# Patient Record
Sex: Male | Born: 1955
Health system: Southern US, Community
[De-identification: ages and names within clinical notes are randomized; demographics above are authoritative.]

## PROBLEM LIST (undated history)

## (undated) DIAGNOSIS — E669 Obesity, unspecified: Secondary | ICD-10-CM

## (undated) DIAGNOSIS — F411 Generalized anxiety disorder: Secondary | ICD-10-CM

## (undated) DIAGNOSIS — Z9889 Other specified postprocedural states: Secondary | ICD-10-CM

## (undated) DIAGNOSIS — I509 Heart failure, unspecified: Secondary | ICD-10-CM

## (undated) DIAGNOSIS — I2510 Atherosclerotic heart disease of native coronary artery without angina pectoris: Secondary | ICD-10-CM

## (undated) DIAGNOSIS — G4733 Obstructive sleep apnea (adult) (pediatric): Secondary | ICD-10-CM

## (undated) DIAGNOSIS — Z951 Presence of aortocoronary bypass graft: Secondary | ICD-10-CM

## (undated) DIAGNOSIS — N183 Chronic kidney disease, stage 3 unspecified: Secondary | ICD-10-CM

## (undated) DIAGNOSIS — I5189 Other ill-defined heart diseases: Secondary | ICD-10-CM

## (undated) DIAGNOSIS — I499 Cardiac arrhythmia, unspecified: Secondary | ICD-10-CM

## (undated) DIAGNOSIS — Z77090 Contact with and (suspected) exposure to asbestos: Secondary | ICD-10-CM

## (undated) DIAGNOSIS — Z8679 Personal history of other diseases of the circulatory system: Secondary | ICD-10-CM

## (undated) DIAGNOSIS — E038 Other specified hypothyroidism: Secondary | ICD-10-CM

## (undated) DIAGNOSIS — R0600 Dyspnea, unspecified: Secondary | ICD-10-CM

## (undated) DIAGNOSIS — G4730 Sleep apnea, unspecified: Secondary | ICD-10-CM

## (undated) DIAGNOSIS — Z7901 Long term (current) use of anticoagulants: Secondary | ICD-10-CM

## (undated) DIAGNOSIS — J449 Chronic obstructive pulmonary disease, unspecified: Secondary | ICD-10-CM

## (undated) DIAGNOSIS — J309 Allergic rhinitis, unspecified: Secondary | ICD-10-CM

## (undated) DIAGNOSIS — M1711 Unilateral primary osteoarthritis, right knee: Secondary | ICD-10-CM

## (undated) DIAGNOSIS — R739 Hyperglycemia, unspecified: Secondary | ICD-10-CM

## (undated) DIAGNOSIS — E785 Hyperlipidemia, unspecified: Secondary | ICD-10-CM

## (undated) DIAGNOSIS — I4891 Unspecified atrial fibrillation: Secondary | ICD-10-CM

## (undated) DIAGNOSIS — T7840XA Allergy, unspecified, initial encounter: Secondary | ICD-10-CM

## (undated) DIAGNOSIS — K8020 Calculus of gallbladder without cholecystitis without obstruction: Secondary | ICD-10-CM

## (undated) DIAGNOSIS — I10 Essential (primary) hypertension: Secondary | ICD-10-CM

## (undated) HISTORY — DX: Heart failure, unspecified: I50.9

## (undated) HISTORY — PX: CARDIOVERSION: SHX1299

## (undated) HISTORY — DX: Sleep apnea, unspecified: G47.30

## (undated) HISTORY — DX: Obstructive sleep apnea (adult) (pediatric): G47.33

## (undated) HISTORY — DX: Chronic obstructive pulmonary disease, unspecified: J44.9

## (undated) HISTORY — PX: OTHER SURGICAL HISTORY: SHX169

## (undated) HISTORY — DX: Unilateral primary osteoarthritis, right knee: M17.11

## (undated) HISTORY — DX: Contact with and (suspected) exposure to asbestos: Z77.090

## (undated) HISTORY — DX: Hyperlipidemia, unspecified: E78.5

## (undated) HISTORY — DX: Calculus of gallbladder without cholecystitis without obstruction: K80.20

## (undated) HISTORY — DX: Generalized anxiety disorder: F41.1

## (undated) HISTORY — DX: Long term (current) use of anticoagulants: Z79.01

## (undated) HISTORY — DX: Other specified hypothyroidism: E03.8

## (undated) HISTORY — PX: HAND SURGERY: SHX662

## (undated) HISTORY — DX: Unspecified atrial fibrillation: I48.91

## (undated) HISTORY — DX: Allergic rhinitis, unspecified: J30.9

## (undated) HISTORY — DX: Other ill-defined heart diseases: I51.89

## (undated) HISTORY — DX: Essential (primary) hypertension: I10

## (undated) HISTORY — DX: Allergy, unspecified, initial encounter: T78.40XA

---

## 1997-08-20 ENCOUNTER — Emergency Department (HOSPITAL_COMMUNITY): Admission: EM | Admit: 1997-08-20 | Discharge: 1997-08-20 | Payer: Self-pay | Admitting: Emergency Medicine

## 2002-08-31 ENCOUNTER — Encounter: Payer: Self-pay | Admitting: Cardiology

## 2002-08-31 ENCOUNTER — Encounter: Payer: Self-pay | Admitting: Emergency Medicine

## 2002-08-31 ENCOUNTER — Inpatient Hospital Stay (HOSPITAL_COMMUNITY): Admission: EM | Admit: 2002-08-31 | Discharge: 2002-09-05 | Payer: Self-pay | Admitting: Emergency Medicine

## 2002-09-01 ENCOUNTER — Encounter: Payer: Self-pay | Admitting: Cardiology

## 2002-09-01 ENCOUNTER — Encounter: Payer: Self-pay | Admitting: Internal Medicine

## 2002-09-02 ENCOUNTER — Encounter: Payer: Self-pay | Admitting: Internal Medicine

## 2002-09-04 ENCOUNTER — Encounter: Payer: Self-pay | Admitting: Internal Medicine

## 2002-10-20 ENCOUNTER — Ambulatory Visit (HOSPITAL_COMMUNITY): Admission: RE | Admit: 2002-10-20 | Discharge: 2002-10-20 | Payer: Self-pay | Admitting: Internal Medicine

## 2003-01-10 ENCOUNTER — Ambulatory Visit (HOSPITAL_COMMUNITY): Admission: RE | Admit: 2003-01-10 | Discharge: 2003-01-10 | Payer: Self-pay | Admitting: Internal Medicine

## 2003-02-25 HISTORY — PX: OTHER SURGICAL HISTORY: SHX169

## 2003-12-27 ENCOUNTER — Ambulatory Visit: Payer: Self-pay | Admitting: Cardiology

## 2004-01-10 ENCOUNTER — Ambulatory Visit: Payer: Self-pay | Admitting: Internal Medicine

## 2004-01-24 ENCOUNTER — Ambulatory Visit: Payer: Self-pay | Admitting: Cardiology

## 2004-02-02 ENCOUNTER — Emergency Department (HOSPITAL_COMMUNITY): Admission: EM | Admit: 2004-02-02 | Discharge: 2004-02-02 | Payer: Self-pay | Admitting: Emergency Medicine

## 2004-02-15 ENCOUNTER — Ambulatory Visit: Payer: Self-pay | Admitting: Internal Medicine

## 2004-02-21 ENCOUNTER — Ambulatory Visit: Payer: Self-pay | Admitting: Internal Medicine

## 2004-03-27 ENCOUNTER — Ambulatory Visit: Payer: Self-pay | Admitting: Internal Medicine

## 2004-04-16 ENCOUNTER — Ambulatory Visit: Payer: Self-pay | Admitting: *Deleted

## 2004-04-30 ENCOUNTER — Ambulatory Visit: Payer: Self-pay | Admitting: Internal Medicine

## 2004-05-07 ENCOUNTER — Ambulatory Visit: Payer: Self-pay | Admitting: Internal Medicine

## 2004-06-11 ENCOUNTER — Ambulatory Visit: Payer: Self-pay

## 2004-07-02 ENCOUNTER — Ambulatory Visit: Payer: Self-pay | Admitting: Cardiology

## 2004-07-15 ENCOUNTER — Ambulatory Visit: Payer: Self-pay | Admitting: Internal Medicine

## 2004-07-29 ENCOUNTER — Ambulatory Visit: Payer: Self-pay | Admitting: Cardiology

## 2004-08-26 ENCOUNTER — Ambulatory Visit: Payer: Self-pay | Admitting: Cardiology

## 2004-09-09 ENCOUNTER — Ambulatory Visit: Payer: Self-pay | Admitting: Cardiology

## 2004-10-07 ENCOUNTER — Ambulatory Visit: Payer: Self-pay | Admitting: Internal Medicine

## 2004-11-04 ENCOUNTER — Ambulatory Visit: Payer: Self-pay | Admitting: Internal Medicine

## 2004-11-18 ENCOUNTER — Ambulatory Visit: Payer: Self-pay | Admitting: Cardiology

## 2004-12-16 ENCOUNTER — Ambulatory Visit: Payer: Self-pay | Admitting: Internal Medicine

## 2005-01-05 ENCOUNTER — Emergency Department (HOSPITAL_COMMUNITY): Admission: EM | Admit: 2005-01-05 | Discharge: 2005-01-05 | Payer: Self-pay | Admitting: Emergency Medicine

## 2005-01-13 ENCOUNTER — Ambulatory Visit: Payer: Self-pay | Admitting: Cardiology

## 2005-01-13 ENCOUNTER — Ambulatory Visit: Payer: Self-pay | Admitting: Internal Medicine

## 2005-02-10 ENCOUNTER — Ambulatory Visit: Payer: Self-pay | Admitting: Internal Medicine

## 2005-03-10 ENCOUNTER — Ambulatory Visit: Payer: Self-pay | Admitting: Internal Medicine

## 2005-03-25 ENCOUNTER — Ambulatory Visit: Payer: Self-pay | Admitting: Internal Medicine

## 2005-04-07 ENCOUNTER — Ambulatory Visit: Payer: Self-pay | Admitting: Internal Medicine

## 2005-05-05 ENCOUNTER — Ambulatory Visit: Payer: Self-pay | Admitting: Internal Medicine

## 2005-06-03 ENCOUNTER — Ambulatory Visit: Payer: Self-pay | Admitting: Cardiology

## 2005-06-20 ENCOUNTER — Ambulatory Visit: Payer: Self-pay | Admitting: Internal Medicine

## 2005-07-01 ENCOUNTER — Ambulatory Visit: Payer: Self-pay | Admitting: Cardiology

## 2005-07-22 ENCOUNTER — Ambulatory Visit: Payer: Self-pay | Admitting: Internal Medicine

## 2005-08-29 ENCOUNTER — Ambulatory Visit: Payer: Self-pay | Admitting: Internal Medicine

## 2005-09-15 ENCOUNTER — Ambulatory Visit: Payer: Self-pay | Admitting: Internal Medicine

## 2005-10-08 ENCOUNTER — Ambulatory Visit: Payer: Self-pay | Admitting: Cardiology

## 2005-12-03 ENCOUNTER — Ambulatory Visit: Payer: Self-pay | Admitting: Cardiology

## 2005-12-03 ENCOUNTER — Ambulatory Visit: Payer: Self-pay | Admitting: Internal Medicine

## 2005-12-19 ENCOUNTER — Ambulatory Visit: Payer: Self-pay | Admitting: Internal Medicine

## 2005-12-26 ENCOUNTER — Ambulatory Visit: Payer: Self-pay | Admitting: Cardiology

## 2005-12-31 ENCOUNTER — Ambulatory Visit: Payer: Self-pay | Admitting: Cardiology

## 2006-01-07 ENCOUNTER — Ambulatory Visit: Payer: Self-pay | Admitting: Cardiology

## 2006-01-07 ENCOUNTER — Ambulatory Visit: Payer: Self-pay | Admitting: Internal Medicine

## 2006-01-12 ENCOUNTER — Ambulatory Visit: Payer: Self-pay | Admitting: Internal Medicine

## 2006-01-12 ENCOUNTER — Ambulatory Visit (HOSPITAL_COMMUNITY): Admission: RE | Admit: 2006-01-12 | Discharge: 2006-01-12 | Payer: Self-pay | Admitting: Internal Medicine

## 2006-01-21 ENCOUNTER — Ambulatory Visit: Payer: Self-pay | Admitting: Cardiovascular Disease

## 2006-02-04 ENCOUNTER — Ambulatory Visit: Payer: Self-pay | Admitting: Internal Medicine

## 2006-02-11 ENCOUNTER — Ambulatory Visit: Payer: Self-pay | Admitting: Cardiology

## 2006-02-12 ENCOUNTER — Ambulatory Visit: Payer: Self-pay | Admitting: Cardiology

## 2006-02-13 ENCOUNTER — Ambulatory Visit: Payer: Self-pay | Admitting: Internal Medicine

## 2006-02-13 ENCOUNTER — Ambulatory Visit (HOSPITAL_COMMUNITY): Admission: RE | Admit: 2006-02-13 | Discharge: 2006-02-13 | Payer: Self-pay | Admitting: Internal Medicine

## 2006-03-11 ENCOUNTER — Ambulatory Visit: Payer: Self-pay | Admitting: Cardiology

## 2006-03-25 ENCOUNTER — Ambulatory Visit: Payer: Self-pay | Admitting: Cardiovascular Disease

## 2006-04-06 ENCOUNTER — Ambulatory Visit: Payer: Self-pay | Admitting: Internal Medicine

## 2006-04-06 ENCOUNTER — Ambulatory Visit: Payer: Self-pay | Admitting: Cardiology

## 2006-04-24 ENCOUNTER — Ambulatory Visit: Payer: Self-pay | Admitting: Internal Medicine

## 2006-04-24 LAB — CONVERTED CEMR LAB
ALT: 38 U/L
AST: 34 U/L
Albumin: 3.8 g/dL
Alkaline Phosphatase: 62 U/L
BUN: 9 mg/dL
Bilirubin, Direct: 0.1 mg/dL
CO2: 29 meq/L
Calcium: 8.9 mg/dL
Chloride: 103 meq/L
Creatinine, Ser: 1 mg/dL
GFR calc Af Amer: 101 mL/min
GFR calc non Af Amer: 84 mL/min
Glucose, Bld: 87 mg/dL
Potassium: 4.1 meq/L
Sodium: 137 meq/L
TSH: 3.83 u[IU]/mL
Total Bilirubin: 0.9 mg/dL
Total Protein: 6.9 g/dL

## 2006-05-04 ENCOUNTER — Ambulatory Visit: Payer: Self-pay | Admitting: Internal Medicine

## 2006-06-01 ENCOUNTER — Ambulatory Visit: Payer: Self-pay | Admitting: Cardiovascular Disease

## 2006-06-15 ENCOUNTER — Ambulatory Visit: Payer: Self-pay | Admitting: Cardiovascular Disease

## 2006-06-26 ENCOUNTER — Ambulatory Visit: Payer: Self-pay | Admitting: Cardiology

## 2006-06-26 ENCOUNTER — Ambulatory Visit: Payer: Self-pay | Admitting: Internal Medicine

## 2006-07-10 ENCOUNTER — Ambulatory Visit: Payer: Self-pay | Admitting: Internal Medicine

## 2006-07-10 LAB — CONVERTED CEMR LAB
BUN: 19 mg/dL (ref 6–23)
CO2: 30 meq/L (ref 19–32)
Calcium: 9 mg/dL (ref 8.4–10.5)
Chloride: 108 meq/L (ref 96–112)
Creatinine, Ser: 1.2 mg/dL (ref 0.4–1.5)
GFR calc Af Amer: 82 mL/min
GFR calc non Af Amer: 68 mL/min
Glucose, Bld: 80 mg/dL (ref 70–99)
Potassium: 4.1 meq/L (ref 3.5–5.1)
Sodium: 144 meq/L (ref 135–145)

## 2006-07-31 ENCOUNTER — Ambulatory Visit: Payer: Self-pay | Admitting: Cardiology

## 2006-08-31 ENCOUNTER — Ambulatory Visit: Payer: Self-pay | Admitting: Internal Medicine

## 2006-08-31 ENCOUNTER — Ambulatory Visit: Payer: Self-pay | Admitting: Cardiovascular Disease

## 2006-09-17 ENCOUNTER — Ambulatory Visit: Payer: Self-pay | Admitting: Pulmonary Disease

## 2006-09-28 ENCOUNTER — Ambulatory Visit: Payer: Self-pay | Admitting: Cardiology

## 2006-09-28 DIAGNOSIS — E785 Hyperlipidemia, unspecified: Secondary | ICD-10-CM

## 2006-09-28 DIAGNOSIS — I10 Essential (primary) hypertension: Secondary | ICD-10-CM

## 2006-09-28 DIAGNOSIS — E038 Other specified hypothyroidism: Secondary | ICD-10-CM

## 2006-09-28 DIAGNOSIS — I4891 Unspecified atrial fibrillation: Secondary | ICD-10-CM

## 2006-09-28 DIAGNOSIS — I509 Heart failure, unspecified: Secondary | ICD-10-CM | POA: Insufficient documentation

## 2006-09-28 DIAGNOSIS — I5189 Other ill-defined heart diseases: Secondary | ICD-10-CM

## 2006-09-28 DIAGNOSIS — E039 Hypothyroidism, unspecified: Secondary | ICD-10-CM

## 2006-09-28 DIAGNOSIS — F411 Generalized anxiety disorder: Secondary | ICD-10-CM | POA: Insufficient documentation

## 2006-09-28 DIAGNOSIS — I4892 Unspecified atrial flutter: Secondary | ICD-10-CM

## 2006-09-28 HISTORY — DX: Other specified hypothyroidism: E03.8

## 2006-09-28 HISTORY — DX: Essential (primary) hypertension: I10

## 2006-09-28 HISTORY — DX: Other ill-defined heart diseases: I51.89

## 2006-09-28 HISTORY — DX: Hyperlipidemia, unspecified: E78.5

## 2006-09-28 HISTORY — DX: Generalized anxiety disorder: F41.1

## 2006-09-28 HISTORY — DX: Unspecified atrial fibrillation: I48.91

## 2006-10-27 ENCOUNTER — Ambulatory Visit: Payer: Self-pay | Admitting: Internal Medicine

## 2006-11-19 ENCOUNTER — Ambulatory Visit: Payer: Self-pay | Admitting: Internal Medicine

## 2006-11-19 LAB — CONVERTED CEMR LAB
ALT: 33 units/L (ref 0–53)
AST: 27 units/L (ref 0–37)
Albumin: 3.7 g/dL (ref 3.5–5.2)
Alkaline Phosphatase: 58 units/L (ref 39–117)
BUN: 17 mg/dL (ref 6–23)
Basophils Absolute: 0 10*3/uL (ref 0.0–0.1)
Basophils Relative: 0.2 % (ref 0.0–1.0)
Bilirubin Urine: NEGATIVE
Bilirubin, Direct: 0.1 mg/dL (ref 0.0–0.3)
CO2: 32 meq/L (ref 19–32)
Calcium: 9 mg/dL (ref 8.4–10.5)
Chloride: 105 meq/L (ref 96–112)
Cholesterol: 147 mg/dL (ref 0–200)
Creatinine, Ser: 1.2 mg/dL (ref 0.4–1.5)
Eosinophils Absolute: 1.2 10*3/uL — ABNORMAL HIGH (ref 0.0–0.6)
Eosinophils Relative: 16.2 % — ABNORMAL HIGH (ref 0.0–5.0)
GFR calc Af Amer: 82 mL/min
GFR calc non Af Amer: 68 mL/min
Glucose, Bld: 100 mg/dL — ABNORMAL HIGH (ref 70–99)
HCT: 43.9 % (ref 39.0–52.0)
HDL: 37.3 mg/dL — ABNORMAL LOW (ref >39.0)
Hemoglobin, Urine: NEGATIVE
Hemoglobin: 15.5 g/dL (ref 13.0–17.0)
Ketones, ur: NEGATIVE mg/dL
LDL Cholesterol: 100 mg/dL — ABNORMAL HIGH (ref 0–99)
Leukocytes, UA: NEGATIVE
Lymphocytes Relative: 17.6 % (ref 12.0–46.0)
MCHC: 35.3 g/dL (ref 30.0–36.0)
MCV: 93.7 fL (ref 78.0–100.0)
Monocytes Absolute: 0.6 10*3/uL (ref 0.2–0.7)
Monocytes Relative: 8 % (ref 3.0–11.0)
Neutro Abs: 4.1 10*3/uL (ref 1.4–7.7)
Neutrophils Relative %: 58 % (ref 43.0–77.0)
Nitrite: NEGATIVE
PSA: 0.41 ng/mL (ref 0.10–4.00)
Platelets: 193 10*3/uL (ref 150–400)
Potassium: 4 meq/L (ref 3.5–5.1)
RBC: 4.68 M/uL (ref 4.22–5.81)
RDW: 12.7 % (ref 11.5–14.6)
Sodium: 143 meq/L (ref 135–145)
Specific Gravity, Urine: >=1.03 (ref 1.000–1.03)
TSH: 2.35 microintl units/mL (ref 0.35–5.50)
Total Bilirubin: 0.9 mg/dL (ref 0.3–1.2)
Total CHOL/HDL Ratio: 3.9
Total Protein: 6.9 g/dL (ref 6.0–8.3)
Triglycerides: 47 mg/dL (ref 0–149)
Urine Glucose: NEGATIVE mg/dL
Urobilinogen, UA: 0.2 (ref 0.0–1.0)
VLDL: 9 mg/dL (ref 0–40)
WBC: 7.2 10*3/uL (ref 4.5–10.5)
pH: 5.5 (ref 5.0–8.0)

## 2006-11-24 ENCOUNTER — Ambulatory Visit: Payer: Self-pay | Admitting: Cardiology

## 2006-12-30 ENCOUNTER — Ambulatory Visit: Payer: Self-pay | Admitting: Cardiology

## 2006-12-30 ENCOUNTER — Ambulatory Visit: Payer: Self-pay | Admitting: Internal Medicine

## 2007-01-14 DIAGNOSIS — J309 Allergic rhinitis, unspecified: Secondary | ICD-10-CM

## 2007-01-14 HISTORY — DX: Allergic rhinitis, unspecified: J30.9

## 2007-01-15 ENCOUNTER — Ambulatory Visit: Payer: Self-pay | Admitting: Internal Medicine

## 2007-02-01 ENCOUNTER — Ambulatory Visit: Payer: Self-pay | Admitting: Internal Medicine

## 2007-02-16 ENCOUNTER — Ambulatory Visit: Payer: Self-pay | Admitting: Cardiology

## 2007-05-11 ENCOUNTER — Ambulatory Visit: Payer: Self-pay | Admitting: Cardiology

## 2007-05-20 ENCOUNTER — Ambulatory Visit: Payer: Self-pay | Admitting: Cardiology

## 2007-06-03 ENCOUNTER — Ambulatory Visit: Payer: Self-pay | Admitting: Cardiovascular Disease

## 2007-06-23 ENCOUNTER — Emergency Department (HOSPITAL_COMMUNITY): Admission: EM | Admit: 2007-06-23 | Discharge: 2007-06-23 | Payer: Self-pay | Admitting: Emergency Medicine

## 2007-06-24 ENCOUNTER — Ambulatory Visit: Payer: Self-pay | Admitting: Internal Medicine

## 2007-07-06 ENCOUNTER — Ambulatory Visit: Payer: Self-pay | Admitting: Cardiovascular Disease

## 2007-07-29 ENCOUNTER — Ambulatory Visit: Payer: Self-pay | Admitting: Internal Medicine

## 2007-08-19 ENCOUNTER — Ambulatory Visit: Payer: Self-pay | Admitting: Internal Medicine

## 2007-09-28 ENCOUNTER — Ambulatory Visit: Payer: Self-pay | Admitting: Cardiology

## 2007-10-26 ENCOUNTER — Ambulatory Visit: Payer: Self-pay | Admitting: Cardiology

## 2007-11-09 ENCOUNTER — Ambulatory Visit: Payer: Self-pay | Admitting: Cardiology

## 2007-11-10 ENCOUNTER — Ambulatory Visit: Payer: Self-pay | Admitting: Cardiovascular Disease

## 2007-11-15 ENCOUNTER — Ambulatory Visit: Payer: Self-pay

## 2007-11-15 ENCOUNTER — Encounter: Payer: Self-pay | Admitting: Cardiovascular Disease

## 2007-11-15 ENCOUNTER — Ambulatory Visit: Payer: Self-pay | Admitting: Internal Medicine

## 2007-11-16 ENCOUNTER — Telehealth: Payer: Self-pay | Admitting: Internal Medicine

## 2007-11-22 ENCOUNTER — Ambulatory Visit: Payer: Self-pay | Admitting: Internal Medicine

## 2007-11-22 ENCOUNTER — Telehealth (INDEPENDENT_AMBULATORY_CARE_PROVIDER_SITE_OTHER): Payer: Self-pay | Admitting: *Deleted

## 2007-11-24 ENCOUNTER — Ambulatory Visit: Payer: Self-pay

## 2007-11-26 ENCOUNTER — Ambulatory Visit: Payer: Self-pay | Admitting: Internal Medicine

## 2007-11-26 LAB — CONVERTED CEMR LAB
ALT: 68 units/L — ABNORMAL HIGH (ref 0–53)
AST: 67 units/L — ABNORMAL HIGH (ref 0–37)
Albumin: 4 g/dL (ref 3.5–5.2)
Alkaline Phosphatase: 68 units/L (ref 39–117)
BUN: 14 mg/dL (ref 6–23)
Basophils Absolute: 0 10*3/uL (ref 0.0–0.1)
Basophils Relative: 0.1 % (ref 0.0–3.0)
Bilirubin Urine: NEGATIVE
Bilirubin, Direct: 0.1 mg/dL (ref 0.0–0.3)
CO2: 30 meq/L (ref 19–32)
Calcium: 8.9 mg/dL (ref 8.4–10.5)
Chloride: 101 meq/L (ref 96–112)
Cholesterol: 149 mg/dL (ref 0–200)
Creatinine, Ser: 1.2 mg/dL (ref 0.4–1.5)
Eosinophils Absolute: 0.4 10*3/uL (ref 0.0–0.7)
Eosinophils Relative: 5.6 % — ABNORMAL HIGH (ref 0.0–5.0)
GFR calc Af Amer: 82 mL/min
GFR calc non Af Amer: 68 mL/min
Glucose, Bld: 94 mg/dL (ref 70–99)
HCT: 44 % (ref 39.0–52.0)
HDL: 36.7 mg/dL — ABNORMAL LOW (ref >39.0)
Hemoglobin, Urine: NEGATIVE
Hemoglobin: 15.3 g/dL (ref 13.0–17.0)
Ketones, ur: NEGATIVE mg/dL
LDL Cholesterol: 87 mg/dL (ref 0–99)
Leukocytes, UA: NEGATIVE
Lymphocytes Relative: 15.3 % (ref 12.0–46.0)
MCHC: 34.7 g/dL (ref 30.0–36.0)
MCV: 88.7 fL (ref 78.0–100.0)
Monocytes Absolute: 0.5 10*3/uL (ref 0.1–1.0)
Monocytes Relative: 7.6 % (ref 3.0–12.0)
Neutro Abs: 4.9 10*3/uL (ref 1.4–7.7)
Neutrophils Relative %: 71.4 % (ref 43.0–77.0)
Nitrite: NEGATIVE
PSA: 0.41 ng/mL (ref 0.10–4.00)
Platelets: 199 10*3/uL (ref 150–400)
Potassium: 3.5 meq/L (ref 3.5–5.1)
RBC: 4.96 M/uL (ref 4.22–5.81)
RDW: 13.6 % (ref 11.5–14.6)
Sodium: 140 meq/L (ref 135–145)
Specific Gravity, Urine: 1.02 (ref 1.000–1.03)
TSH: 2.62 microintl units/mL (ref 0.35–5.50)
Total Bilirubin: 0.7 mg/dL (ref 0.3–1.2)
Total CHOL/HDL Ratio: 4.1
Total Protein, Urine: NEGATIVE mg/dL
Total Protein: 8 g/dL (ref 6.0–8.3)
Triglycerides: 127 mg/dL (ref 0–149)
Urine Glucose: NEGATIVE mg/dL
Urobilinogen, UA: 0.2 (ref 0.0–1.0)
VLDL: 25 mg/dL (ref 0–40)
WBC: 6.9 10*3/uL (ref 4.5–10.5)
pH: 6 (ref 5.0–8.0)

## 2007-12-01 ENCOUNTER — Ambulatory Visit: Payer: Self-pay | Admitting: Cardiology

## 2007-12-02 ENCOUNTER — Encounter: Payer: Self-pay | Admitting: Internal Medicine

## 2007-12-06 ENCOUNTER — Encounter: Payer: Self-pay | Admitting: Pulmonary Disease

## 2007-12-06 ENCOUNTER — Ambulatory Visit (HOSPITAL_BASED_OUTPATIENT_CLINIC_OR_DEPARTMENT_OTHER): Admission: RE | Admit: 2007-12-06 | Discharge: 2007-12-06 | Payer: Self-pay | Admitting: Internal Medicine

## 2007-12-09 ENCOUNTER — Ambulatory Visit: Payer: Self-pay

## 2007-12-11 ENCOUNTER — Ambulatory Visit: Payer: Self-pay | Admitting: Internal Medicine

## 2007-12-16 ENCOUNTER — Ambulatory Visit: Payer: Self-pay | Admitting: Internal Medicine

## 2007-12-23 ENCOUNTER — Ambulatory Visit: Payer: Self-pay | Admitting: Internal Medicine

## 2007-12-27 ENCOUNTER — Ambulatory Visit: Payer: Self-pay | Admitting: Internal Medicine

## 2007-12-27 LAB — CONVERTED CEMR LAB
BUN: 18 mg/dL (ref 6–23)
Basophils Absolute: 0 10*3/uL (ref 0.0–0.1)
Basophils Relative: 0.2 % (ref 0.0–3.0)
CO2: 32 meq/L (ref 19–32)
Calcium: 8.6 mg/dL (ref 8.4–10.5)
Chloride: 103 meq/L (ref 96–112)
Creatinine, Ser: 1.1 mg/dL (ref 0.4–1.5)
Eosinophils Absolute: 0.3 10*3/uL (ref 0.0–0.7)
Eosinophils Relative: 5 % (ref 0.0–5.0)
GFR calc Af Amer: 90 mL/min
GFR calc non Af Amer: 75 mL/min
Glucose, Bld: 98 mg/dL (ref 70–99)
HCT: 41.3 % (ref 39.0–52.0)
Hemoglobin: 14.2 g/dL (ref 13.0–17.0)
INR: 2.7 — ABNORMAL HIGH (ref 0.8–1.0)
Lymphocytes Relative: 16.5 % (ref 12.0–46.0)
MCHC: 34.3 g/dL (ref 30.0–36.0)
MCV: 88.4 fL (ref 78.0–100.0)
Monocytes Absolute: 0.5 10*3/uL (ref 0.1–1.0)
Monocytes Relative: 7.8 % (ref 3.0–12.0)
Neutro Abs: 4.5 10*3/uL (ref 1.4–7.7)
Neutrophils Relative %: 70.5 % (ref 43.0–77.0)
Platelets: 154 10*3/uL (ref 150–400)
Potassium: 3.8 meq/L (ref 3.5–5.1)
Prothrombin Time: 29 s — ABNORMAL HIGH (ref 10.9–13.3)
RBC: 4.68 M/uL (ref 4.22–5.81)
RDW: 13.8 % (ref 11.5–14.6)
Sodium: 141 meq/L (ref 135–145)
WBC: 6.4 10*3/uL (ref 4.5–10.5)
aPTT: 46.3 s — ABNORMAL HIGH (ref 21.7–29.8)

## 2007-12-29 ENCOUNTER — Ambulatory Visit (HOSPITAL_COMMUNITY): Admission: RE | Admit: 2007-12-29 | Discharge: 2007-12-29 | Payer: Self-pay | Admitting: Cardiology

## 2007-12-29 ENCOUNTER — Ambulatory Visit: Payer: Self-pay | Admitting: Cardiology

## 2007-12-29 ENCOUNTER — Encounter: Payer: Self-pay | Admitting: Cardiology

## 2007-12-30 ENCOUNTER — Inpatient Hospital Stay (HOSPITAL_COMMUNITY): Admission: RE | Admit: 2007-12-30 | Discharge: 2007-12-31 | Payer: Self-pay | Admitting: Radiology

## 2007-12-30 ENCOUNTER — Ambulatory Visit: Payer: Self-pay | Admitting: Internal Medicine

## 2008-01-03 ENCOUNTER — Ambulatory Visit: Payer: Self-pay | Admitting: Cardiology

## 2008-01-06 ENCOUNTER — Ambulatory Visit: Payer: Self-pay | Admitting: Pulmonary Disease

## 2008-01-06 DIAGNOSIS — G4733 Obstructive sleep apnea (adult) (pediatric): Secondary | ICD-10-CM

## 2008-01-06 HISTORY — DX: Obstructive sleep apnea (adult) (pediatric): G47.33

## 2008-01-10 ENCOUNTER — Ambulatory Visit: Payer: Self-pay | Admitting: Cardiology

## 2008-01-24 ENCOUNTER — Telehealth (INDEPENDENT_AMBULATORY_CARE_PROVIDER_SITE_OTHER): Payer: Self-pay | Admitting: *Deleted

## 2008-01-25 ENCOUNTER — Ambulatory Visit: Payer: Self-pay | Admitting: Cardiology

## 2008-01-28 ENCOUNTER — Encounter: Payer: Self-pay | Admitting: Pulmonary Disease

## 2008-02-08 ENCOUNTER — Ambulatory Visit: Payer: Self-pay | Admitting: Cardiology

## 2008-02-17 ENCOUNTER — Ambulatory Visit: Payer: Self-pay | Admitting: Pulmonary Disease

## 2008-03-02 ENCOUNTER — Ambulatory Visit: Payer: Self-pay | Admitting: Internal Medicine

## 2008-03-02 ENCOUNTER — Ambulatory Visit: Payer: Self-pay | Admitting: Cardiovascular Disease

## 2008-03-30 ENCOUNTER — Ambulatory Visit: Payer: Self-pay | Admitting: Internal Medicine

## 2008-04-24 ENCOUNTER — Telehealth (INDEPENDENT_AMBULATORY_CARE_PROVIDER_SITE_OTHER): Payer: Self-pay | Admitting: *Deleted

## 2008-04-28 ENCOUNTER — Ambulatory Visit: Payer: Self-pay | Admitting: Internal Medicine

## 2008-05-24 ENCOUNTER — Ambulatory Visit: Payer: Self-pay | Admitting: Cardiology

## 2008-05-29 ENCOUNTER — Telehealth (INDEPENDENT_AMBULATORY_CARE_PROVIDER_SITE_OTHER): Payer: Self-pay | Admitting: *Deleted

## 2008-06-16 ENCOUNTER — Telehealth (INDEPENDENT_AMBULATORY_CARE_PROVIDER_SITE_OTHER): Payer: Self-pay | Admitting: *Deleted

## 2008-06-21 ENCOUNTER — Ambulatory Visit: Payer: Self-pay | Admitting: Internal Medicine

## 2008-07-19 ENCOUNTER — Ambulatory Visit: Payer: Self-pay | Admitting: Cardiology

## 2008-07-25 ENCOUNTER — Encounter: Payer: Self-pay | Admitting: *Deleted

## 2008-08-16 ENCOUNTER — Ambulatory Visit: Payer: Self-pay | Admitting: Cardiology

## 2008-08-16 LAB — CONVERTED CEMR LAB
POC INR: 2.6
Prothrombin Time: 19.5 s

## 2008-08-22 ENCOUNTER — Telehealth (INDEPENDENT_AMBULATORY_CARE_PROVIDER_SITE_OTHER): Payer: Self-pay | Admitting: *Deleted

## 2008-08-30 ENCOUNTER — Encounter: Payer: Self-pay | Admitting: *Deleted

## 2008-09-13 ENCOUNTER — Ambulatory Visit: Payer: Self-pay | Admitting: Internal Medicine

## 2008-09-13 LAB — CONVERTED CEMR LAB
POC INR: 2.2
Prothrombin Time: 18.1 s

## 2008-09-14 ENCOUNTER — Ambulatory Visit: Payer: Self-pay | Admitting: Internal Medicine

## 2008-09-14 DIAGNOSIS — R0602 Shortness of breath: Secondary | ICD-10-CM

## 2008-09-14 DIAGNOSIS — R0789 Other chest pain: Secondary | ICD-10-CM | POA: Insufficient documentation

## 2008-10-03 ENCOUNTER — Telehealth: Payer: Self-pay | Admitting: Internal Medicine

## 2008-10-04 ENCOUNTER — Ambulatory Visit: Payer: Self-pay | Admitting: Internal Medicine

## 2008-10-04 DIAGNOSIS — J449 Chronic obstructive pulmonary disease, unspecified: Secondary | ICD-10-CM

## 2008-10-04 DIAGNOSIS — J441 Chronic obstructive pulmonary disease with (acute) exacerbation: Secondary | ICD-10-CM

## 2008-10-09 ENCOUNTER — Ambulatory Visit: Payer: Self-pay | Admitting: Pulmonary Disease

## 2008-10-10 ENCOUNTER — Encounter: Payer: Self-pay | Admitting: Internal Medicine

## 2008-10-11 ENCOUNTER — Ambulatory Visit: Payer: Self-pay | Admitting: Internal Medicine

## 2008-10-11 LAB — CONVERTED CEMR LAB: POC INR: 4.8

## 2008-10-18 ENCOUNTER — Ambulatory Visit: Payer: Self-pay | Admitting: Cardiovascular Disease

## 2008-10-18 LAB — CONVERTED CEMR LAB: POC INR: 1.9

## 2008-10-27 ENCOUNTER — Ambulatory Visit: Payer: Self-pay | Admitting: Internal Medicine

## 2008-10-27 DIAGNOSIS — M25569 Pain in unspecified knee: Secondary | ICD-10-CM

## 2008-11-09 ENCOUNTER — Telehealth (INDEPENDENT_AMBULATORY_CARE_PROVIDER_SITE_OTHER): Payer: Self-pay | Admitting: *Deleted

## 2008-11-10 ENCOUNTER — Ambulatory Visit: Payer: Self-pay | Admitting: Internal Medicine

## 2008-11-10 ENCOUNTER — Telehealth: Payer: Self-pay | Admitting: Internal Medicine

## 2008-11-10 LAB — CONVERTED CEMR LAB: POC INR: 4

## 2008-11-14 ENCOUNTER — Encounter: Payer: Self-pay | Admitting: Internal Medicine

## 2008-11-14 ENCOUNTER — Ambulatory Visit: Payer: Self-pay | Admitting: Internal Medicine

## 2008-11-22 ENCOUNTER — Telehealth: Payer: Self-pay | Admitting: Internal Medicine

## 2008-11-24 ENCOUNTER — Ambulatory Visit: Payer: Self-pay | Admitting: Internal Medicine

## 2008-11-24 LAB — CONVERTED CEMR LAB: POC INR: 3.3

## 2008-11-30 ENCOUNTER — Ambulatory Visit: Payer: Self-pay | Admitting: Internal Medicine

## 2008-11-30 LAB — CONVERTED CEMR LAB
ALT: 19 units/L (ref 0–53)
AST: 21 units/L (ref 0–37)
Albumin: 3.9 g/dL (ref 3.5–5.2)
Alkaline Phosphatase: 61 units/L (ref 39–117)
BUN: 29 mg/dL — ABNORMAL HIGH (ref 6–23)
Basophils Absolute: 0 10*3/uL (ref 0.0–0.1)
Basophils Relative: 0.2 % (ref 0.0–3.0)
Bilirubin Urine: NEGATIVE
Bilirubin, Direct: 0.1 mg/dL (ref 0.0–0.3)
CO2: 30 meq/L (ref 19–32)
Calcium: 9 mg/dL (ref 8.4–10.5)
Chloride: 101 meq/L (ref 96–112)
Cholesterol: 183 mg/dL (ref 0–200)
Creatinine, Ser: 1.5 mg/dL (ref 0.4–1.5)
Eosinophils Absolute: 0.3 10*3/uL (ref 0.0–0.7)
Eosinophils Relative: 3.9 % (ref 0.0–5.0)
GFR calc non Af Amer: 51.9 mL/min (ref >60)
Glucose, Bld: 92 mg/dL (ref 70–99)
HCT: 41.8 % (ref 39.0–52.0)
HDL: 39.7 mg/dL (ref >39.00)
Hemoglobin, Urine: NEGATIVE
Hemoglobin: 14.2 g/dL (ref 13.0–17.0)
Ketones, ur: NEGATIVE mg/dL
LDL Cholesterol: 128 mg/dL — ABNORMAL HIGH (ref 0–99)
Leukocytes, UA: NEGATIVE
Lymphocytes Relative: 16.9 % (ref 12.0–46.0)
Lymphs Abs: 1.2 10*3/uL (ref 0.7–4.0)
MCHC: 34.1 g/dL (ref 30.0–36.0)
MCV: 88.7 fL (ref 78.0–100.0)
Monocytes Absolute: 0.7 10*3/uL (ref 0.1–1.0)
Monocytes Relative: 9.3 % (ref 3.0–12.0)
Neutro Abs: 5.1 10*3/uL (ref 1.4–7.7)
Neutrophils Relative %: 69.7 % (ref 43.0–77.0)
Nitrite: NEGATIVE
PSA: 0.61 ng/mL (ref 0.10–4.00)
Platelets: 178 10*3/uL (ref 150.0–400.0)
Potassium: 3.6 meq/L (ref 3.5–5.1)
RBC: 4.71 M/uL (ref 4.22–5.81)
RDW: 13.5 % (ref 11.5–14.6)
Sodium: 134 meq/L — ABNORMAL LOW (ref 135–145)
Specific Gravity, Urine: >=1.03 (ref 1.000–1.030)
TSH: 3.06 microintl units/mL (ref 0.35–5.50)
Total Bilirubin: 0.7 mg/dL (ref 0.3–1.2)
Total CHOL/HDL Ratio: 5
Total Protein, Urine: NEGATIVE mg/dL
Total Protein: 7.2 g/dL (ref 6.0–8.3)
Triglycerides: 76 mg/dL (ref 0.0–149.0)
Urine Glucose: NEGATIVE mg/dL
Urobilinogen, UA: 0.2 (ref 0.0–1.0)
VLDL: 15.2 mg/dL (ref 0.0–40.0)
WBC: 7.3 10*3/uL (ref 4.5–10.5)
pH: 5.5 (ref 5.0–8.0)

## 2008-12-06 ENCOUNTER — Ambulatory Visit: Payer: Self-pay | Admitting: Internal Medicine

## 2008-12-08 ENCOUNTER — Ambulatory Visit: Payer: Self-pay | Admitting: Cardiology

## 2008-12-08 LAB — CONVERTED CEMR LAB: POC INR: 2.5

## 2008-12-29 ENCOUNTER — Ambulatory Visit: Payer: Self-pay | Admitting: Cardiology

## 2008-12-29 LAB — CONVERTED CEMR LAB: POC INR: 2.3

## 2009-01-05 ENCOUNTER — Telehealth (INDEPENDENT_AMBULATORY_CARE_PROVIDER_SITE_OTHER): Payer: Self-pay | Admitting: *Deleted

## 2009-01-19 ENCOUNTER — Ambulatory Visit: Payer: Self-pay | Admitting: Internal Medicine

## 2009-01-19 ENCOUNTER — Encounter: Payer: Self-pay | Admitting: Internal Medicine

## 2009-01-19 ENCOUNTER — Ambulatory Visit: Payer: Self-pay

## 2009-01-19 ENCOUNTER — Ambulatory Visit (HOSPITAL_COMMUNITY): Admission: RE | Admit: 2009-01-19 | Discharge: 2009-01-19 | Payer: Self-pay | Admitting: Internal Medicine

## 2009-01-19 LAB — CONVERTED CEMR LAB: POC INR: 2.2

## 2009-02-07 ENCOUNTER — Encounter (INDEPENDENT_AMBULATORY_CARE_PROVIDER_SITE_OTHER): Payer: Self-pay | Admitting: Cardiology

## 2009-02-07 ENCOUNTER — Ambulatory Visit: Payer: Self-pay | Admitting: Internal Medicine

## 2009-02-07 LAB — CONVERTED CEMR LAB: POC INR: 1.6

## 2009-02-21 ENCOUNTER — Ambulatory Visit: Payer: Self-pay | Admitting: Cardiovascular Disease

## 2009-02-21 LAB — CONVERTED CEMR LAB: POC INR: 2

## 2009-03-02 ENCOUNTER — Emergency Department (HOSPITAL_COMMUNITY): Admission: EM | Admit: 2009-03-02 | Discharge: 2009-03-02 | Payer: Self-pay | Admitting: Emergency Medicine

## 2009-03-02 ENCOUNTER — Ambulatory Visit: Payer: Self-pay | Admitting: Internal Medicine

## 2009-03-02 DIAGNOSIS — R0989 Other specified symptoms and signs involving the circulatory and respiratory systems: Secondary | ICD-10-CM

## 2009-03-02 DIAGNOSIS — R079 Chest pain, unspecified: Secondary | ICD-10-CM | POA: Insufficient documentation

## 2009-03-02 DIAGNOSIS — R0609 Other forms of dyspnea: Secondary | ICD-10-CM | POA: Insufficient documentation

## 2009-03-05 ENCOUNTER — Telehealth: Payer: Self-pay | Admitting: Internal Medicine

## 2009-03-07 ENCOUNTER — Telehealth (INDEPENDENT_AMBULATORY_CARE_PROVIDER_SITE_OTHER): Payer: Self-pay | Admitting: *Deleted

## 2009-03-14 ENCOUNTER — Ambulatory Visit: Payer: Self-pay | Admitting: Cardiology

## 2009-03-26 ENCOUNTER — Ambulatory Visit: Payer: Self-pay | Admitting: Internal Medicine

## 2009-04-10 ENCOUNTER — Encounter: Payer: Self-pay | Admitting: Internal Medicine

## 2009-04-11 ENCOUNTER — Ambulatory Visit: Payer: Self-pay | Admitting: Internal Medicine

## 2009-04-11 ENCOUNTER — Ambulatory Visit: Payer: Self-pay

## 2009-04-11 LAB — CONVERTED CEMR LAB: POC INR: 2.2

## 2009-04-16 ENCOUNTER — Telehealth: Payer: Self-pay | Admitting: Internal Medicine

## 2009-04-26 ENCOUNTER — Ambulatory Visit: Payer: Self-pay | Admitting: Internal Medicine

## 2009-04-30 ENCOUNTER — Telehealth: Payer: Self-pay | Admitting: Internal Medicine

## 2009-05-03 ENCOUNTER — Ambulatory Visit: Payer: Self-pay | Admitting: Internal Medicine

## 2009-05-09 ENCOUNTER — Ambulatory Visit: Payer: Self-pay | Admitting: Cardiology

## 2009-05-09 LAB — CONVERTED CEMR LAB: POC INR: 3.5

## 2009-05-16 ENCOUNTER — Encounter: Payer: Self-pay | Admitting: Internal Medicine

## 2009-05-16 ENCOUNTER — Ambulatory Visit: Payer: Self-pay | Admitting: Internal Medicine

## 2009-05-23 ENCOUNTER — Ambulatory Visit: Payer: Self-pay | Admitting: Internal Medicine

## 2009-05-23 ENCOUNTER — Encounter: Payer: Self-pay | Admitting: Internal Medicine

## 2009-05-25 ENCOUNTER — Telehealth: Payer: Self-pay | Admitting: Internal Medicine

## 2009-05-29 ENCOUNTER — Ambulatory Visit: Payer: Self-pay | Admitting: Cardiology

## 2009-05-29 ENCOUNTER — Ambulatory Visit: Payer: Self-pay | Admitting: Internal Medicine

## 2009-05-29 LAB — CONVERTED CEMR LAB
BUN: 15 mg/dL (ref 6–23)
Basophils Absolute: 0 10*3/uL (ref 0.0–0.1)
CO2: 31 meq/L (ref 19–32)
Calcium: 8.7 mg/dL (ref 8.4–10.5)
Eosinophils Absolute: 0.3 10*3/uL (ref 0.0–0.7)
GFR calc non Af Amer: 67.02 mL/min (ref >60)
Glucose, Bld: 79 mg/dL (ref 70–99)
HCT: 36.4 % — ABNORMAL LOW (ref 39.0–52.0)
Lymphocytes Relative: 26.3 % (ref 12.0–46.0)
Lymphs Abs: 1.3 10*3/uL (ref 0.7–4.0)
MCHC: 34.1 g/dL (ref 30.0–36.0)
Monocytes Relative: 10.8 % (ref 3.0–12.0)
Platelets: 181 10*3/uL (ref 150.0–400.0)
Prothrombin Time: 25.2 s — ABNORMAL HIGH (ref 9.1–11.7)
RDW: 15.2 % — ABNORMAL HIGH (ref 11.5–14.6)

## 2009-05-31 ENCOUNTER — Ambulatory Visit (HOSPITAL_COMMUNITY): Admission: RE | Admit: 2009-05-31 | Discharge: 2009-05-31 | Payer: Self-pay | Admitting: Cardiology

## 2009-05-31 ENCOUNTER — Ambulatory Visit: Payer: Self-pay | Admitting: Cardiology

## 2009-06-04 ENCOUNTER — Telehealth: Payer: Self-pay | Admitting: Internal Medicine

## 2009-06-04 ENCOUNTER — Ambulatory Visit: Payer: Self-pay | Admitting: Internal Medicine

## 2009-06-04 ENCOUNTER — Ambulatory Visit (HOSPITAL_COMMUNITY): Admission: RE | Admit: 2009-06-04 | Discharge: 2009-06-04 | Payer: Self-pay | Admitting: Internal Medicine

## 2009-06-04 ENCOUNTER — Encounter: Payer: Self-pay | Admitting: Internal Medicine

## 2009-06-05 ENCOUNTER — Ambulatory Visit: Payer: Self-pay | Admitting: Internal Medicine

## 2009-06-05 ENCOUNTER — Ambulatory Visit (HOSPITAL_COMMUNITY)
Admission: RE | Admit: 2009-06-05 | Discharge: 2009-06-06 | Payer: Self-pay | Source: Home / Self Care | Admitting: Internal Medicine

## 2009-06-12 ENCOUNTER — Ambulatory Visit: Payer: Self-pay | Admitting: Cardiovascular Disease

## 2009-06-12 LAB — CONVERTED CEMR LAB: POC INR: 2.3

## 2009-06-26 ENCOUNTER — Ambulatory Visit: Payer: Self-pay | Admitting: Cardiology

## 2009-07-19 ENCOUNTER — Ambulatory Visit: Payer: Self-pay | Admitting: Internal Medicine

## 2009-07-24 ENCOUNTER — Ambulatory Visit: Payer: Self-pay | Admitting: Cardiology

## 2009-07-30 ENCOUNTER — Telehealth: Payer: Self-pay | Admitting: Internal Medicine

## 2009-07-31 ENCOUNTER — Encounter: Payer: Self-pay | Admitting: Internal Medicine

## 2009-08-06 ENCOUNTER — Telehealth: Payer: Self-pay | Admitting: Internal Medicine

## 2009-08-08 ENCOUNTER — Encounter: Payer: Self-pay | Admitting: Internal Medicine

## 2009-08-14 ENCOUNTER — Telehealth: Payer: Self-pay | Admitting: Internal Medicine

## 2009-08-21 ENCOUNTER — Ambulatory Visit: Payer: Self-pay | Admitting: Cardiology

## 2009-08-21 LAB — CONVERTED CEMR LAB: POC INR: 3

## 2009-09-05 ENCOUNTER — Ambulatory Visit: Payer: Self-pay | Admitting: Internal Medicine

## 2009-09-05 ENCOUNTER — Ambulatory Visit: Payer: Self-pay | Admitting: Cardiology

## 2009-09-05 LAB — CONVERTED CEMR LAB: POC INR: 2.1

## 2009-09-18 ENCOUNTER — Telehealth: Payer: Self-pay | Admitting: Internal Medicine

## 2009-09-20 ENCOUNTER — Telehealth: Payer: Self-pay | Admitting: Internal Medicine

## 2009-10-03 ENCOUNTER — Ambulatory Visit: Payer: Self-pay | Admitting: Cardiology

## 2009-10-03 LAB — CONVERTED CEMR LAB: POC INR: 1.9

## 2009-10-24 ENCOUNTER — Ambulatory Visit: Payer: Self-pay | Admitting: Cardiovascular Disease

## 2009-10-24 LAB — CONVERTED CEMR LAB: POC INR: 2.6

## 2009-11-21 ENCOUNTER — Ambulatory Visit: Payer: Self-pay | Admitting: Internal Medicine

## 2009-11-21 LAB — CONVERTED CEMR LAB: POC INR: 2.5

## 2009-11-23 ENCOUNTER — Telehealth (INDEPENDENT_AMBULATORY_CARE_PROVIDER_SITE_OTHER): Payer: Self-pay | Admitting: *Deleted

## 2009-12-14 ENCOUNTER — Encounter: Payer: Self-pay | Admitting: Internal Medicine

## 2009-12-24 ENCOUNTER — Ambulatory Visit: Payer: Self-pay | Admitting: Cardiology

## 2009-12-24 LAB — CONVERTED CEMR LAB: POC INR: 2

## 2010-01-09 ENCOUNTER — Encounter: Payer: Self-pay | Admitting: Internal Medicine

## 2010-01-09 ENCOUNTER — Ambulatory Visit: Payer: Self-pay | Admitting: Internal Medicine

## 2010-01-14 LAB — CONVERTED CEMR LAB
ALT: 20 units/L (ref 0–53)
AST: 24 units/L (ref 0–37)
Albumin: 3.9 g/dL (ref 3.5–5.2)
Total Bilirubin: 0.4 mg/dL (ref 0.3–1.2)
Total Protein: 6.7 g/dL (ref 6.0–8.3)

## 2010-01-21 ENCOUNTER — Ambulatory Visit: Payer: Self-pay | Admitting: Internal Medicine

## 2010-01-21 LAB — CONVERTED CEMR LAB: POC INR: 2.6

## 2010-01-22 ENCOUNTER — Telehealth (INDEPENDENT_AMBULATORY_CARE_PROVIDER_SITE_OTHER): Payer: Self-pay | Admitting: *Deleted

## 2010-01-22 ENCOUNTER — Telehealth: Payer: Self-pay | Admitting: Internal Medicine

## 2010-01-30 IMAGING — CR DG CHEST 2V
2 series · 2 of 2 positions shown · non-contrast
Comparison: Chest x-ray of 08/25/2008

CLINICAL DATA: Shortness of breath, former smoker, palpitations

CHEST - 2 VIEW

[w chest pa]
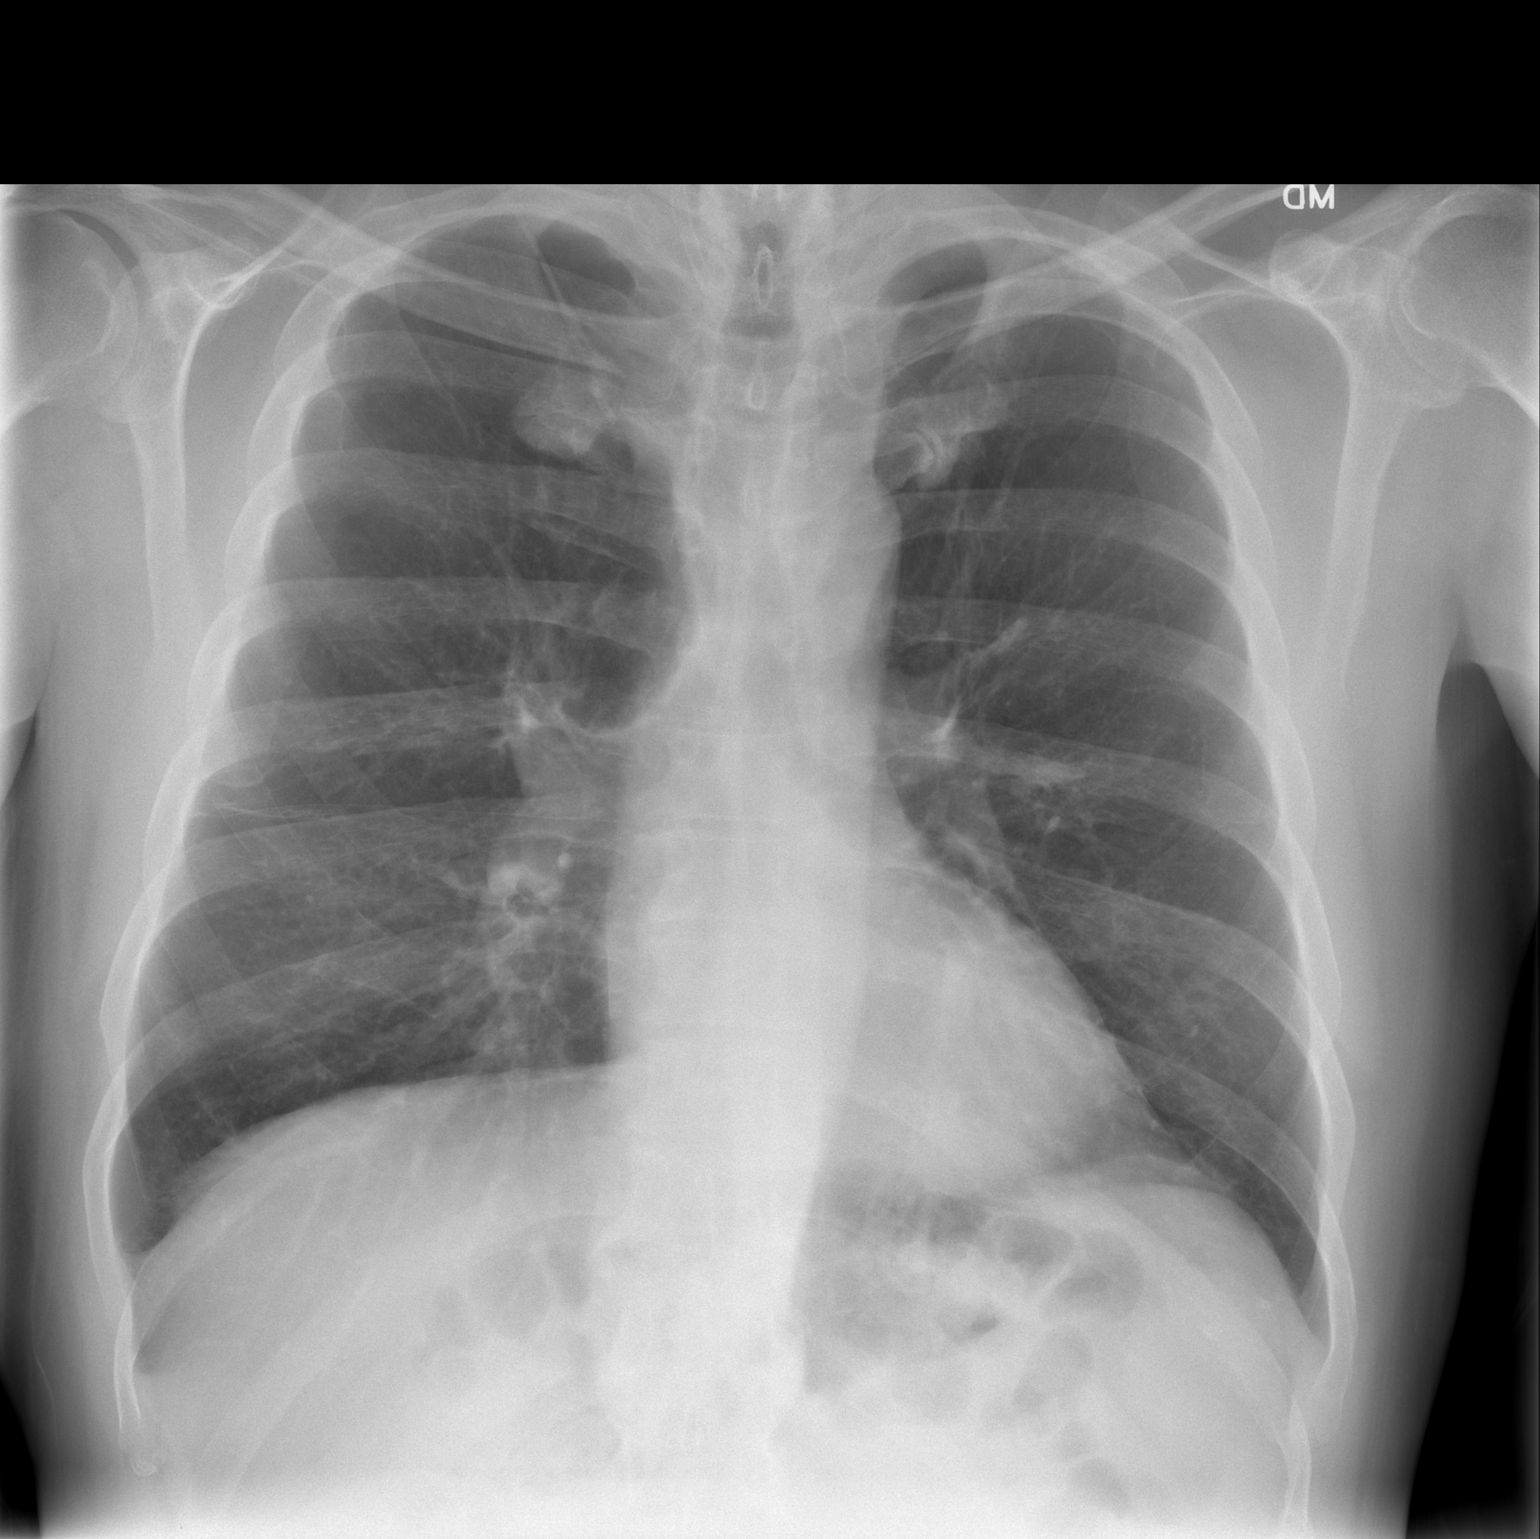

[w chest lat]
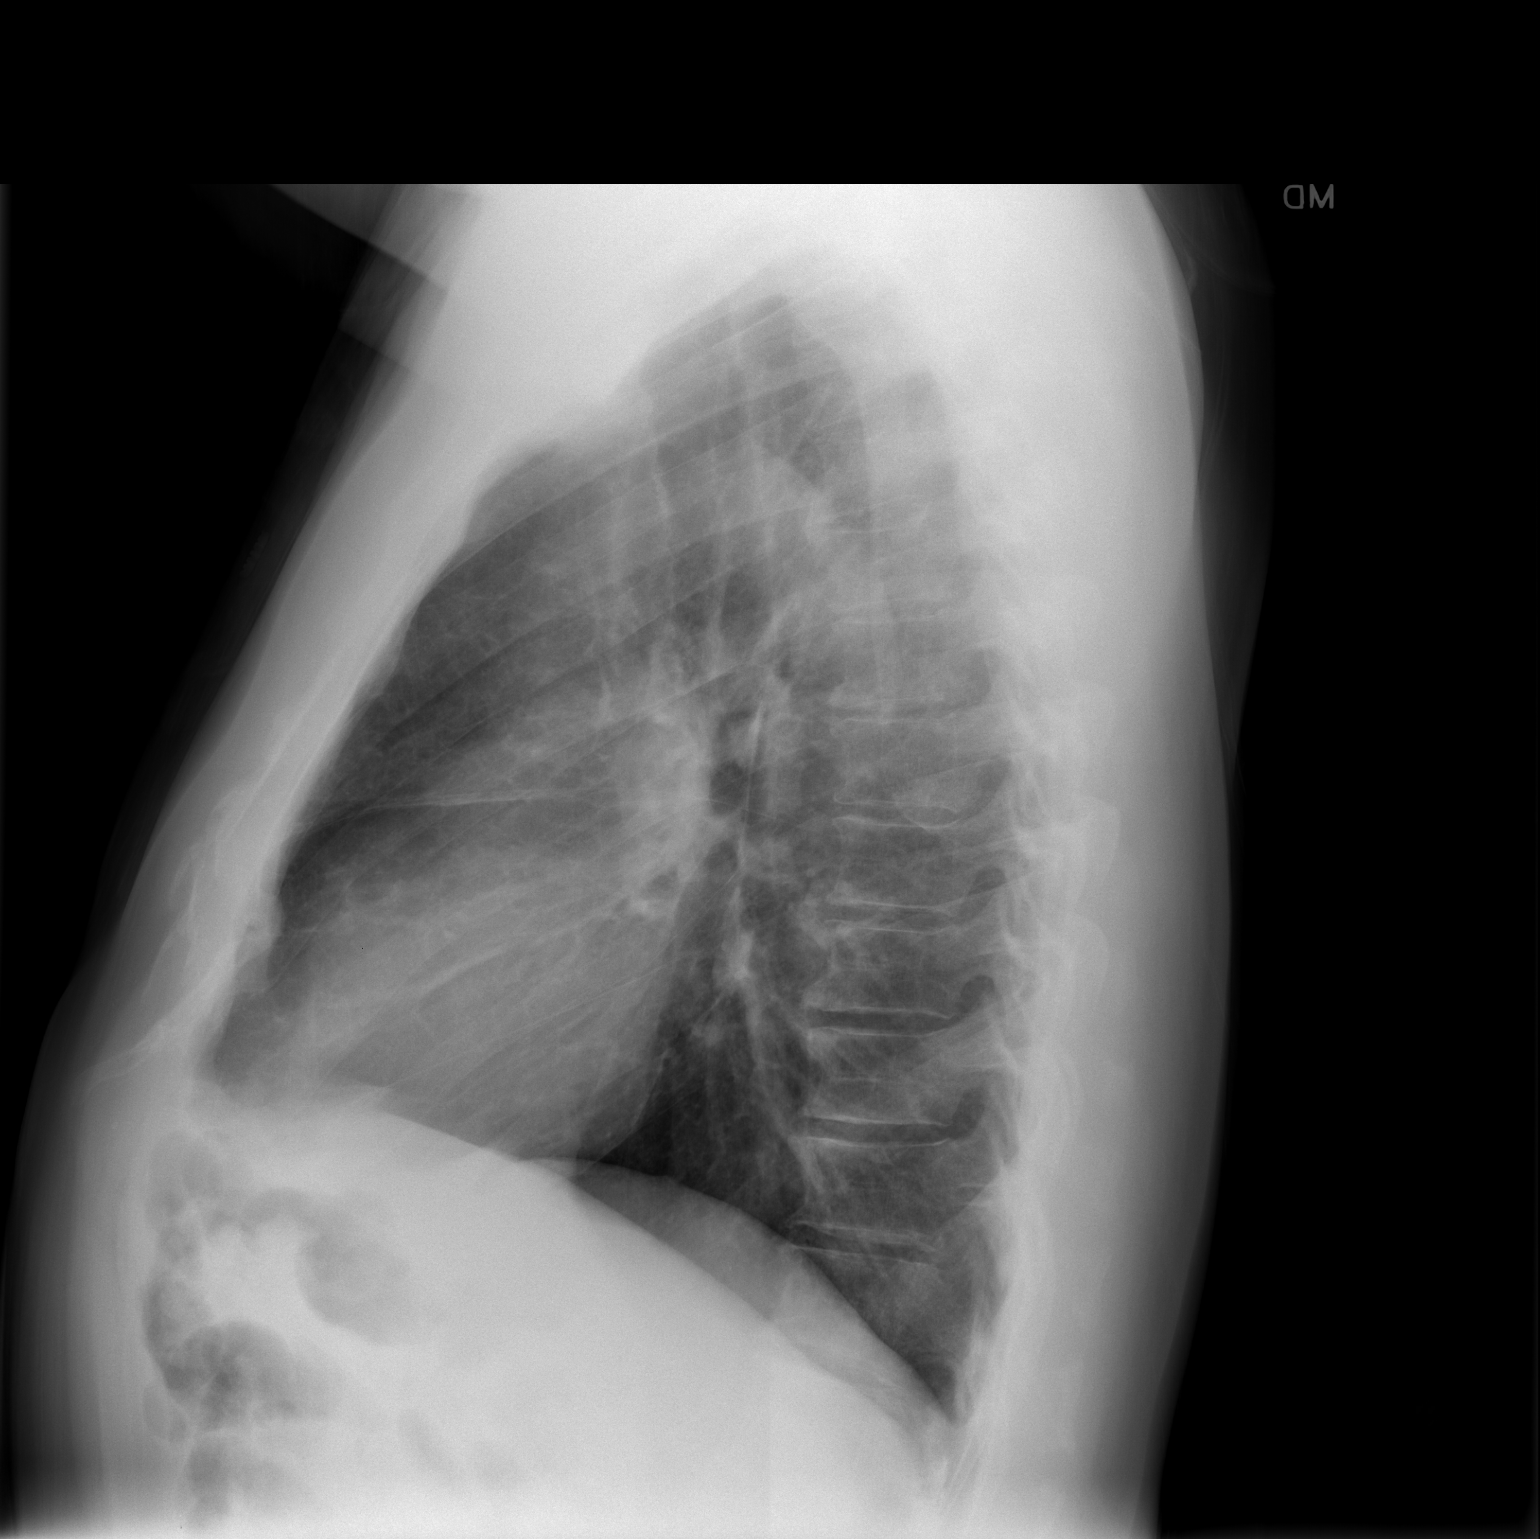

[2 of 2 positions shown; findings below may reference images not displayed]

FINDINGS: The lungs remain hyperaerated.  No active infiltrate or
effusion is seen.  Mediastinal contours are stable.  The heart is
within normal limits in size.  No acute bony abnormality is seen.
IMPRESSION: Stable hyperaeration.  No active lung disease.

## 2010-02-04 ENCOUNTER — Encounter: Payer: Self-pay | Admitting: Internal Medicine

## 2010-02-12 ENCOUNTER — Ambulatory Visit: Payer: Self-pay | Admitting: Internal Medicine

## 2010-02-13 ENCOUNTER — Telehealth: Payer: Self-pay | Admitting: Internal Medicine

## 2010-02-19 ENCOUNTER — Ambulatory Visit: Payer: Self-pay | Admitting: Cardiology

## 2010-02-19 ENCOUNTER — Telehealth: Payer: Self-pay | Admitting: Internal Medicine

## 2010-02-19 LAB — CONVERTED CEMR LAB
INR: 7.5
INR: 7.5 (ref 0.8–1.0)
POC INR: 7.3

## 2010-02-27 ENCOUNTER — Ambulatory Visit: Admission: RE | Admit: 2010-02-27 | Discharge: 2010-02-27 | Payer: Self-pay | Source: Home / Self Care

## 2010-02-27 LAB — CONVERTED CEMR LAB: POC INR: 5.5

## 2010-03-06 ENCOUNTER — Ambulatory Visit: Admission: RE | Admit: 2010-03-06 | Discharge: 2010-03-06 | Payer: Self-pay | Source: Home / Self Care

## 2010-03-06 LAB — CONVERTED CEMR LAB: POC INR: 2.3

## 2010-03-07 ENCOUNTER — Encounter: Payer: Self-pay | Admitting: Internal Medicine

## 2010-03-13 ENCOUNTER — Encounter: Payer: Self-pay | Admitting: Internal Medicine

## 2010-03-13 ENCOUNTER — Ambulatory Visit
Admission: RE | Admit: 2010-03-13 | Discharge: 2010-03-13 | Payer: Self-pay | Source: Home / Self Care | Attending: Internal Medicine | Admitting: Internal Medicine

## 2010-03-13 ENCOUNTER — Ambulatory Visit: Admission: RE | Admit: 2010-03-13 | Discharge: 2010-03-13 | Payer: Self-pay | Source: Home / Self Care

## 2010-03-15 ENCOUNTER — Encounter: Payer: Self-pay | Admitting: Internal Medicine

## 2010-03-19 ENCOUNTER — Telehealth (INDEPENDENT_AMBULATORY_CARE_PROVIDER_SITE_OTHER): Payer: Self-pay | Admitting: *Deleted

## 2010-03-20 ENCOUNTER — Ambulatory Visit: Admission: RE | Admit: 2010-03-20 | Discharge: 2010-03-20 | Payer: Self-pay | Source: Home / Self Care

## 2010-03-20 ENCOUNTER — Telehealth (INDEPENDENT_AMBULATORY_CARE_PROVIDER_SITE_OTHER): Payer: Self-pay | Admitting: *Deleted

## 2010-03-20 ENCOUNTER — Encounter: Payer: Self-pay | Admitting: Cardiology

## 2010-03-20 ENCOUNTER — Encounter: Payer: Self-pay | Admitting: *Deleted

## 2010-03-20 ENCOUNTER — Encounter (HOSPITAL_COMMUNITY)
Admission: RE | Admit: 2010-03-20 | Discharge: 2010-03-26 | Payer: Self-pay | Source: Home / Self Care | Attending: Internal Medicine | Admitting: Internal Medicine

## 2010-03-20 ENCOUNTER — Ambulatory Visit
Admission: RE | Admit: 2010-03-20 | Discharge: 2010-03-20 | Payer: Self-pay | Source: Home / Self Care | Attending: Internal Medicine | Admitting: Internal Medicine

## 2010-03-24 LAB — CONVERTED CEMR LAB
BUN: 22 mg/dL (ref 6–23)
BUN: 30 mg/dL — ABNORMAL HIGH (ref 6–23)
Basophils Absolute: 0.3 10*3/uL — ABNORMAL HIGH (ref 0.0–0.1)
Basophils Relative: 4.7 % — ABNORMAL HIGH (ref 0.0–3.0)
CO2: 29 meq/L (ref 19–32)
CO2: 30 meq/L (ref 19–32)
Calcium: 8.9 mg/dL (ref 8.4–10.5)
Calcium: 9.3 mg/dL (ref 8.4–10.5)
Chloride: 103 meq/L (ref 96–112)
Chloride: 106 meq/L (ref 96–112)
Creatinine, Ser: 1.4 mg/dL (ref 0.4–1.5)
Creatinine, Ser: 1.9 mg/dL — ABNORMAL HIGH (ref 0.4–1.5)
Eosinophils Absolute: 0.4 10*3/uL (ref 0.0–0.7)
Eosinophils Relative: 5.2 % — ABNORMAL HIGH (ref 0.0–5.0)
GFR calc non Af Amer: 39.52 mL/min (ref >60)
GFR calc non Af Amer: 56.25 mL/min (ref >60)
Glucose, Bld: 91 mg/dL (ref 70–99)
Glucose, Bld: 92 mg/dL (ref 70–99)
HCT: 40.9 % (ref 39.0–52.0)
Hemoglobin: 13.8 g/dL (ref 13.0–17.0)
Lymphocytes Relative: 16.1 % (ref 12.0–46.0)
Lymphs Abs: 1.1 10*3/uL (ref 0.7–4.0)
MCHC: 33.6 g/dL (ref 30.0–36.0)
MCV: 87.1 fL (ref 78.0–100.0)
Monocytes Absolute: 0.6 10*3/uL (ref 0.1–1.0)
Monocytes Relative: 8 % (ref 3.0–12.0)
Neutro Abs: 4.6 10*3/uL (ref 1.4–7.7)
Neutrophils Relative %: 66 % (ref 43.0–77.0)
Platelets: 187 10*3/uL (ref 150.0–400.0)
Potassium: 3.4 meq/L — ABNORMAL LOW (ref 3.5–5.1)
Potassium: 4.2 meq/L (ref 3.5–5.1)
RBC: 4.7 M/uL (ref 4.22–5.81)
RDW: 13.9 % (ref 11.5–14.6)
Sodium: 140 meq/L (ref 135–145)
Sodium: 142 meq/L (ref 135–145)
WBC: 7 10*3/uL (ref 4.5–10.5)

## 2010-03-26 NOTE — Assessment & Plan Note (Signed)
Summary: being referred back redo ablation    Visit Type:  Follow-up Primary Provider:  Corwin Levins MD   History of Present Illness: Scott [name] presents today for follow-up.  He underwent afib ablation by me 11/09.  He did very well following his original ablation for over a year.  He has symtpoms of SOB which have been attributed to COPD.  Chest CT has revealed emphysematous changes previously.  PFTs are suggestive of mild obstruction.  Since 1/11, he reports recurrent symptoms of afib.  He describes palpitations with decreased exercise tolerance and fatigue.  He was recently seen by Dr Ladona Ridgel and documented to have recurrent afib.  He has been restarted on Amiodarone and now presents to discuss repeat catheter ablation.  Current Medications (verified): 1)  Alprazolam 0.5 Mg Tabs (Alprazolam) .... Take 1 Tablet By Mouth Twice A Day As Needed 2)  Citalopram Hydrobromide 20 Mg  Tabs (Citalopram Hydrobromide) .... Take 1 Tablet By Mouth Once A Day 3)  Temazepam 30 Mg Caps (Temazepam) .Marland Kitchen.. 1 By Mouth At Bedtime As Needed Sleep - To Fill Dec 23, 2008 4)  Hydrochlorothiazide 25 Mg  Tabs (Hydrochlorothiazide) .Marland Kitchen.. 1 By Mouth Once Daily 5)  Lovastatin 40 Mg Tabs (Lovastatin) .... 2 Tabs Po  Once Daily 6)  Warfarin Sodium 2.5 Mg  Tabs (Warfarin Sodium) .... Take As Directed By Anticoagulation Clinic 7)  Enalapril Maleate 10 Mg  Tabs (Enalapril Maleate) .... Take 1 Tablet By Mouth Once A Day 8)  Tramadol Hcl 50 Mg Tabs (Tramadol Hcl) .Marland Kitchen.. 1 - 2 By Mouth Q 6 Hrs As Needed 9)  Amlodipine Besylate 5 Mg Tabs (Amlodipine Besylate) .Marland Kitchen.. 1 By Mouth Once Daily 10)  Proair Hfa 108 (90 Base) Mcg/act Aers (Albuterol Sulfate) .... 2 Puffs Qid As Needed 11)  Symbicort 160-4.5 Mcg/act Aero (Budesonide-Formoterol Fumarate) .... 2 Puffs Two Times A Day 12)  Amiodarone Hcl 200 Mg Tabs (Amiodarone Hcl) .... Take One Tablet By Mouth Twice A Day  Allergies: 1)  ! * Ambien  Past History:  Past Medical  History: Reviewed history from 10/04/2008 and no changes required. Persistent Atrial fibrillation s/p atrial fibrillation ablation 12/30/07 Typical atrial flutter status post cava-tricuspid isthmus ablation in 2005.  Anxiety Congestive heart failure Hypertension Hyperlipidemia Coumadin Therapy Allergic rhinitis Obstructive sleep apnea, compliant with CPAP.  COPD  Past Surgical History: Reviewed history from 04/10/2008 and no changes required. Hand Surgery- Left,  Radio Frequency Ablation for atrial flutter (CTI) 2005 Radio Frequency Ablation for atrial fibrillation 12/30/07  Family History: Reviewed history from 04/10/2008 and no changes required. The patient's father has dementia and HTN.  His mother died of a stroke and  heart disease.  Social History: The [place] friend.  He has a h/o heavy tobacco use but has not smoked in over 10 years.  He denies ETOH use.  He worked previously in the [employer] and feels that he was exposed to asbestos.  Review of Systems       All systems are reviewed and negative except as listed in the HPI.   Vital Signs:  Patient profile:   55 year old male Height:      66 inches Weight:      185 pounds BMI:     29.97 Pulse rate:   58 / minute BP sitting:   114 / 82  (left arm)  Vitals Entered By: Laurance Flatten CMA (May 03, 2009 2:00 PM)  Physical Exam  General:  Well developed, well nourished, in no acute distress. Head:  normocephalic and atraumatic Eyes:  PERRLA/EOM intact; conjunctiva and lids normal. Mouth:  Teeth, gums and palate normal. Oral mucosa normal. Neck:  Neck supple, no JVD. No masses, thyromegaly or abnormal cervical nodes. Lungs:  Clear bilaterally to auscultation and percussion. Heart:  Non-displaced PMI, chest non-tender; regular rate and rhythm, S1, S2 without murmurs, rubs or gallops. Carotid upstroke normal, no bruit. Normal abdominal aortic size, no bruits. Femorals normal  pulses, no bruits. Pedals normal pulses. No edema, no varicosities. Abdomen:  Bowel sounds positive; abdomen soft and non-tender without masses, organomegaly, or hernias noted. No hepatosplenomegaly. Msk:  Back normal, normal gait. Muscle strength and tone normal. Pulses:  pulses normal in all 4 extremities Extremities:  No clubbing or cyanosis. Neurologic:  Alert and oriented x 3.  CNII-XII intact, strength/sensation are intact Skin:  Intact without lesions or rashes. Cervical Nodes:  no significant adenopathy Psych:  Normal affect.   EKG  Procedure date:  05/03/2009  Findings:      sinus rhythm 58 bpm, PR 196, otherwise normal ekg  Impression & Recommendations:  Problem # 1:  ATRIAL FIBRILLATION (ICD-427.31) Scott [name] is a pleasant 55 yo WM with a h/o persistent afib who presents today for further therapy for recurrent afib.  He is s/p CTI ablation for atrial flutter in 2005.  He subsequently developed difficult to control afib with tachycardia mediated cardiomyopathy requiring high doses of amiodarone.  He underwent atrial fibrillation ablation by me 11/09 and did extremely well for over a year, during which we were able to get him off of amiodarone.  Unfortunately, he has developed recurrent symptomatic afib. Therapeutic strategies for afib including medicine and ablation were discussed in detail with the patient today. Risk, benefits, and alternatives to repeat EP study and radiofrequency ablation for afib were also discussed in detail today. These risks include but are not limited to stroke, bleeding, vascular damage, tamponade, perforation, damage to the esophagus, lungs, and other structures, pulmonary vein stenosis, worsening renal function, and death. The patient understands these risk and wishes to proceed.  We will schedule afib ablation at the next available time. We will obtain a cardiac CT prior to ablation to better defince his pulmonary venous anatomy prior to ablation.  He is  continued on amiodarone for now.  Problem # 2:  DYSPNEA ON EXERTION (ICD-786.09) Likely due to COPD.  He reports h/o heavy tobacco and also asbestos exposure.  Prior PFTs and Chest CT suggest emphasematous changes.  We will better define his pulmonary venous anatomy with cardiac CT before proceeding to repeat catheter ablation.  Problem # 3:  OBSTRUCTIVE SLEEP APNEA (ICD-327.23) the importance of CPAP was stressed today  Problem # 4:  HYPERTENSION (ICD-401.9) stable no changes His updated medication list for this problem includes:    Hydrochlorothiazide 25 Mg Tabs (Hydrochlorothiazide) .Marland Kitchen... 1 by mouth once daily    Enalapril Maleate 10 Mg Tabs (Enalapril maleate) .Marland Kitchen... Take 1 tablet by mouth once a day    Amlodipine Besylate 5 Mg Tabs (Amlodipine besylate) .Marland Kitchen... 1 by mouth once daily  Problem # 5:  CONGESTIVE HEART FAILURE (ICD-428.0) EF has normalized with treatment of afib

## 2010-03-26 NOTE — Medication Information (Signed)
Summary: rov/eac  Anticoagulant Therapy  Managed by: Bethena Midget, RN, BSN Referring MD: Sharrell Ku PCP: Corwin Levins MD Supervising MD: Myrtis Ser MD, Tinnie Gens Indication 1: Atrial Flutter (ICD-427.32) Indication 2: Atrial Fibrillation (ICD-427.31) Lab Used: [facility] Site: [facility] INR POC 3.0 INR RANGE 2 - 3  Dietary changes: no    Health status changes: no    Bleeding/hemorrhagic complications: no    Recent/future hospitalizations: no    Any changes in medication regimen? no    Recent/future dental: no  Any missed doses?: no       Is patient compliant with meds? yes       Allergies: 1)  ! * Ambien  Anticoagulation Management History:      The patient is taking warfarin and comes in today for a routine follow up visit.  Negative risk factors for bleeding include an age less than 30 years old.  The bleeding index is 'low risk'.  Positive CHADS2 values include History of CHF and History of HTN.  Negative CHADS2 values include Age > 50 years old.  The start date was 09/01/2002.  His last INR was 2.4 ratio.  Anticoagulation responsible provider: Myrtis Ser MD, Tinnie Gens.  INR POC: 3.0.  Cuvette Lot#: [id number].  Exp: 09/2010.    Anticoagulation Management Assessment/Plan:      The patient's current anticoagulation dose is Warfarin sodium 2.5 mg  tabs: Take as directed by Anticoagulation Clinic.  The target INR is 2.0-3.0.  The next INR is due 09/05/2009.  Anticoagulation instructions were given to patient.  Results were reviewed/authorized by Bethena Midget, RN, BSN.  He was notified by Bethena Midget, RN, BSN.         Prior Anticoagulation Instructions: INR 2.4  Continue taking 1 tablets on Monday and 1 tablet all other days.  Return to clinic in 4 weeks.    Current Anticoagulation Instructions: INR 3.0 Continue 2.5mg s daily except 5mg s on Mondays. Recheck in 2 weeks with MD visit.

## 2010-03-26 NOTE — Progress Notes (Signed)
----   Converted from flag ---- ---- 03/07/2009 11:24 AM, Edman Circle wrote: appt 1/31 @ 1:45 with Ladona Ridgel  ---- 03/05/2009 9:45 AM, Dagoberto Reef wrote: Thanks  ---- 03/05/2009 9:28 AM, Corwin Levins MD wrote: The following orders have been entered for this patient and placed on Admin Hold:  Type:     Referral       Code:   Cardiology Description:   Cardiology Referral Order Date:   03/05/2009   Authorized By:   Corwin Levins MD Order #:   [id number] Clinical Notes:   dr taylor ------------------------------

## 2010-03-26 NOTE — Medication Information (Signed)
Summary: rov/tm  Anticoagulant Therapy  Managed by: Elaina Pattee, PharmD Referring MD: Sharrell Ku PCP: Corwin Levins MD Supervising MD: Graciela Husbands MD, Viviann Spare Indication 1: Atrial Flutter (ICD-427.32) Indication 2: Atrial Fibrillation (ICD-427.31) Lab Used: [facility] Site: [facility] INR POC 2.5 INR RANGE 2 - 3  Dietary changes: no    Health status changes: no    Bleeding/hemorrhagic complications: no    Recent/future hospitalizations: yes       Details: Ablation on next Tuesday.  TEE on Monday.  Any changes in medication regimen? no    Recent/future dental: no  Any missed doses?: no       Is patient compliant with meds? yes       Allergies: 1)  ! * Ambien  Anticoagulation Management History:      The patient is taking warfarin and comes in today for a routine follow up visit.  Negative risk factors for bleeding include an age less than 61 years old.  The bleeding index is 'low risk'.  Positive CHADS2 values include History of CHF and History of HTN.  Negative CHADS2 values include Age > 82 years old.  The start date was 09/01/2002.  His last INR was 2.7 RATIO.  Anticoagulation responsible provider: Graciela Husbands MD, Viviann Spare.  INR POC: 2.5.  Cuvette Lot#: [id number].  Exp: 06/2010.    Anticoagulation Management Assessment/Plan:      The patient's current anticoagulation dose is Warfarin sodium 2.5 mg  tabs: Take as directed by Anticoagulation Clinic.  The target INR is 2.0-3.0.  The next INR is due 06/12/2009.  Anticoagulation instructions were given to patient.  Results were reviewed/authorized by Elaina Pattee, PharmD.  He was notified by Elaina Pattee, PharmD.         Prior Anticoagulation Instructions: INR 3.2 Continue 2.5mg s everyday except 5mg s on Mondays. Recheck in one week.   Current Anticoagulation Instructions: INR 2.5. Take 1 tablet daily except 2 tablets on Mondays.  Recheck after ablation.

## 2010-03-26 NOTE — Medication Information (Signed)
Summary: rov/ewj  Anticoagulant Therapy  Managed by: Bethena Midget, RN, BSN Referring MD: Sharrell Ku PCP: Corwin Levins MD Supervising MD: Gala Romney MD, Reuel Boom Indication 1: Atrial Flutter (ICD-427.32) Indication 2: Atrial Fibrillation (ICD-427.31) Lab Used: [facility] Site: [facility] INR POC 3.2 INR RANGE 2 - 3          Comments: Pending Ablation on 06/05/09 and TEE on 06/04/09/   Allergies: 1)  ! * Ambien  Anticoagulation Management History:      The patient is taking warfarin and comes in today for a routine follow up visit.  Negative risk factors for bleeding include an age less than 45 years old.  The bleeding index is 'low risk'.  Positive CHADS2 values include History of CHF and History of HTN.  Negative CHADS2 values include Age > 22 years old.  The start date was 09/01/2002.  His last INR was 2.7 RATIO.  Anticoagulation responsible provider: Bensimhon MD, Reuel Boom.  INR POC: 3.2.  Cuvette Lot#: [id number].  Exp: 06/2010.    Anticoagulation Management Assessment/Plan:      The patient's current anticoagulation dose is Warfarin sodium 2.5 mg  tabs: Take as directed by Anticoagulation Clinic.  The target INR is 2.0-3.0.  The next INR is due 05/30/2009.  Anticoagulation instructions were given to patient.  Results were reviewed/authorized by Bethena Midget, RN, BSN.  He was notified by Bethena Midget, RN, BSN.         Prior Anticoagulation Instructions: INR 3.2  Continue on same dosage 1 tablet daily except 2 tablets on Mondays.  Recheck in 1 week.    Current Anticoagulation Instructions: INR 3.2 Continue 2.5mg s everyday except 5mg s on Mondays. Recheck in one week.   Appended Document: [facility]in Clinic    Anticoagulant Therapy  Managed by: Bethena Midget, RN, BSN Referring MD: Sharrell Ku PCP: Corwin Levins MD Supervising MD: Gala Romney MD, Reuel Boom Indication 1: Atrial Flutter (ICD-427.32) Indication 2: Atrial Fibrillation (ICD-427.31) Lab Used: [facility] Site: [facility] INR RANGE 2 - 3  Dietary changes: no    Health status changes: no    Bleeding/hemorrhagic complications: no    Recent/future hospitalizations: no    Any changes in medication regimen? no    Recent/future dental: no  Any missed doses?: no       Is patient compliant with meds? yes       Allergies: 1)  ! * Ambien  Anticoagulation Management History:      Negative risk factors for bleeding include an age less than 60 years old.  The bleeding index is 'low risk'.  Positive CHADS2 values include History of CHF and History of HTN.  Negative CHADS2 values include Age > 19 years old.  The start date was 09/01/2002.  His last INR was 2.7 RATIO.  Anticoagulation responsible provider: Bensimhon MD, Reuel Boom.  Exp: 06/2010.    Anticoagulation Management Assessment/Plan:      The patient's current anticoagulation dose is Warfarin sodium 2.5 mg  tabs: Take as directed by Anticoagulation Clinic.  The target INR is 2.0-3.0.  The next INR is due 05/30/2009.  Anticoagulation instructions were given to patient.  Results were reviewed/authorized by Bethena Midget, RN, BSN.         Prior Anticoagulation Instructions: INR 3.2 Continue 2.5mg s everyday except 5mg s on Mondays. Recheck in one week.

## 2010-03-26 NOTE — Letter (Signed)
Summary: ELectrophysiology/Ablation Procedure Instructions  [facility], Main Office  [address]   [place], [place] [address]   Phone: [phone]  Fax: [phone]     Electrophysiology/Ablation Procedure Instructions    You are scheduled for a(n) a-fib ablation on 06/05/09 at 7:30am with Dr. Johney Frame.  1.  Please come to the Short Stay Center at [facility] at 5:30am on the day of your procedure.  2.  Come prepared to stay overnight.   Please bring your insurance cards and a list of your medications.  3.  Come to the [place] office on 05/29/09 at          for lab work.  The lab at [facility] is open from 8:30 AM to 1:30 PM and 2:30 PM to 5:00 PM.    You do not have to be fasting.  4.  Do not have anything to eat or drink after midnight the night before your procedure.  5.  All of your remaining medications may be taken with a small amount of water   * Occasionally, EP studies and ablations can become lengthy.  Please make your family aware of this before your procedure starts.  Average time ranges from 2-8 hours for EP studies/ablations.  Your physician will locate your family after the procedure with the results.  * If you have any questions after you get home, please call the office at [phone]. Anselm Pancoast  TEE-06/04/09  be at hospital 10:30am  Go to short stay center nothing to eat or drink after midnight  Cardiac CT 05/31/09  Merita Norton will be calling you with all the instructions

## 2010-03-26 NOTE — Assessment & Plan Note (Signed)
Summary: 4 month rov/sl   Visit Type:  Follow-up Referring Provider:  Dr Ladona Ridgel Primary Provider:  Corwin Levins MD  CC:  4 month ROV; C/O Chest pain.  History of Present Illness: The patient presents today for routine electrophysiology followup. He has begun having breakthrough afib every few days.  He feels that episodes of afib may last several minutes to several hours.  He reports associated fatigue and palpitations.  The patient denies symptoms of chest pain, shortness of breath, orthopnea, PND, lower extremity edema, dizziness, presyncope, syncope, or neurologic sequela. The patient is tolerating medications without difficulties and is otherwise without complaint today.   Problems Prior to Update: 1)  Dyspnea On Exertion  (ICD-786.09) 2)  Chest Pain  (ICD-786.50) 3)  Preventive Health Care  (ICD-V70.0) 4)  Knee Pain, Right  (ICD-719.46) 5)  Chronic Obstructive Pulmonary Disease, Acute Exacerbation  (ICD-491.21) 6)  COPD  (ICD-496) 7)  Dyspnea  (ICD-786.05) 8)  Chest Pain, Atypical  (ICD-786.59) 9)  Obstructive Sleep Apnea  (ICD-327.23) 10)  Preventive Health Care  (ICD-V70.0) 11)  Allergic Rhinitis  (ICD-477.9) 12)  Hypothyroidism, Acquired Nec  (ICD-244.8) 13)  Coumadin Therapy  (ICD-V58.61) 14)  Atrial Flutter  (ICD-427.32) 15)  Hyperlipidemia  (ICD-272.4) 16)  Hypertension  (ICD-401.9) 17)  Congestive Heart Failure  (ICD-428.0) 18)  Anxiety  (ICD-300.00) 19)  Atrial Fibrillation  (ICD-427.31)  Medications Prior to Update: 1)  Alprazolam 0.5 Mg Tabs (Alprazolam) .... Take 1 Tablet By Mouth Twice A Day As Needed - To Fill September 22, 2009 2)  Citalopram Hydrobromide 20 Mg  Tabs (Citalopram Hydrobromide) .... Take 1 Tablet By Mouth Once A Day 3)  Temazepam 30 Mg Caps (Temazepam) .Marland Kitchen.. 1 By Mouth At Bedtime As Needed Sleep - To [facility] September 22, 2009 4)  Hydrochlorothiazide 25 Mg  Tabs (Hydrochlorothiazide) .Marland Kitchen.. 1 By Mouth Once Daily 5)  Lovastatin 40 Mg Tabs (Lovastatin) .... 2  Tabs Po  Once Daily 6)  Warfarin Sodium 2.5 Mg  Tabs (Warfarin Sodium) .... Take As Directed By Anticoagulation Clinic 7)  Enalapril Maleate 10 Mg  Tabs (Enalapril Maleate) .... Take 1 Tablet By Mouth Once A Day 8)  Tramadol Hcl 50 Mg Tabs (Tramadol Hcl) .Marland Kitchen.. 1 - 2 By Mouth Q 6 Hrs As Needed 9)  Amlodipine Besylate 5 Mg Tabs (Amlodipine Besylate) .Marland Kitchen.. 1 By Mouth Once Daily 10)  Proair Hfa 108 (90 Base) Mcg/act Aers (Albuterol Sulfate) .... 2 Puffs Qid As Needed 11)  Symbicort 160-4.5 Mcg/act Aero (Budesonide-Formoterol Fumarate) .... 2 Puffs Two Times A Day 12)  Amiodarone Hcl 200 Mg Tabs (Amiodarone Hcl) .... Take 1/2 Tablet By Mouth Daily  Current Medications (verified): 1)  Alprazolam 0.5 Mg Tabs (Alprazolam) .... Take 1 Tablet By Mouth Twice A Day As Needed - To Fill September 22, 2009 2)  Citalopram Hydrobromide 20 Mg  Tabs (Citalopram Hydrobromide) .... Take 1 Tablet By Mouth Once A Day 3)  Temazepam 30 Mg Caps (Temazepam) .Marland Kitchen.. 1 By Mouth At Bedtime As Needed Sleep - To [facility] September 22, 2009 4)  Hydrochlorothiazide 25 Mg  Tabs (Hydrochlorothiazide) .Marland Kitchen.. 1 By Mouth Once Daily 5)  Lovastatin 40 Mg Tabs (Lovastatin) .... 2 Tabs Po  Once Daily 6)  Warfarin Sodium 2.5 Mg  Tabs (Warfarin Sodium) .... Take As Directed By Anticoagulation Clinic 7)  Enalapril Maleate 10 Mg  Tabs (Enalapril Maleate) .... Take 1 Tablet By Mouth Once A Day 8)  Tramadol Hcl 50 Mg Tabs (Tramadol Hcl) .Marland Kitchen.. 1 - 2  By Mouth Q 6 Hrs As Needed 9)  Amlodipine Besylate 5 Mg Tabs (Amlodipine Besylate) .Marland Kitchen.. 1 By Mouth Once Daily 10)  Proair Hfa 108 (90 Base) Mcg/act Aers (Albuterol Sulfate) .... 2 Puffs Qid As Needed 11)  Symbicort 160-4.5 Mcg/act Aero (Budesonide-Formoterol Fumarate) .... 2 Puffs Two Times A Day 12)  Amiodarone Hcl 200 Mg Tabs (Amiodarone Hcl) .... Take 1/2 Tablet By Mouth Daily  Allergies: 1)  ! * Ambien  Past History:  Past Medical History: Reviewed history from 10/04/2008 and no changes required. Persistent  Atrial fibrillation s/p atrial fibrillation ablation 12/30/07 Typical atrial flutter status post cava-tricuspid isthmus ablation in 2005.  Anxiety Congestive heart failure Hypertension Hyperlipidemia Coumadin Therapy Allergic rhinitis Obstructive sleep apnea, compliant with CPAP.  COPD  Past Surgical History: Reviewed history from 04/10/2008 and no changes required. Hand Surgery- Left,  Radio Frequency Ablation for atrial flutter (CTI) 2005 Radio Frequency Ablation for atrial fibrillation 12/30/07  Social History: Reviewed history from 05/03/2009 and no changes required. The [place].  He has a h/o heavy tobacco use but has not smoked in over 10 years.  He denies ETOH use.  He worked previously in the [employer] and feels that he was exposed to asbestos.  Review of Systems       All systems are reviewed and negative except as listed in the HPI.   Vital Signs:  Patient profile:   55 year old male Height:      66 inches Weight:      199 pounds BMI:     32.24 Pulse rate:   54 / minute BP sitting:   122 / 80  (left arm) Cuff size:   regular  Vitals Entered By: Stanton Kidney, EMT-P (January 09, 2010 9:51 AM)  Physical Exam  General:  Well developed, well nourished, in no acute distress. Head:  normocephalic and atraumatic Eyes:  PERRLA/EOM intact; conjunctiva and lids normal. Mouth:  Teeth, gums and palate normal. Oral mucosa normal. Neck:  Neck supple, no JVD. No masses, thyromegaly or abnormal cervical nodes. Lungs:  Clear bilaterally to auscultation and percussion. Heart:  Non-displaced PMI, chest non-tender; regular rate and rhythm, S1, S2 without murmurs, rubs or gallops. Carotid upstroke normal, no bruit. Normal abdominal aortic size, no bruits. Femorals normal pulses, no bruits. Pedals normal pulses. No edema, no varicosities. Abdomen:  Bowel sounds positive; abdomen soft and non-tender without masses, organomegaly, or  hernias noted. No hepatosplenomegaly. Msk:  Back normal, normal gait. Muscle strength and tone normal. Pulses:  pulses normal in all 4 extremities Extremities:  No clubbing or cyanosis. Neurologic:  Alert and oriented x 3.   EKG  Procedure date:  01/09/2010  Findings:      sinus bradycardia, otherwise normal ekg  Impression & Recommendations:  Problem # 1:  ATRIAL FIBRILLATION (ICD-427.31)  He reports breakthrough afib on low dose amiodarone.  Therapeutic strategies for afib including medicine and repeat ablation were discussed in detail with the patient today.  I have offered multaq, tikosyn, or increased amiodarone.  I also offered referral to [facility] for possible third ablation.  We discussed potential side effects of amiodarone including liver, thryoid, and lung dysfunction.  He is very clear that he wishes to increase amiodarone at this time.  We will check TFTs and LFTs today. We will increase amiodarone to 200mg  two times a day x 4 weeks,then 200mg  daily. Continue coumadin  Orders: TLB-TSH (Thyroid Stimulating Hormone) (84443-TSH) TLB-T4 (Thyrox), Free (  [address]-UE4V) TLB-Hepatic/Liver Function Pnl (80076-HEPATIC)  Problem # 2:  COPD (ICD-496) stable  Problem # 3:  HYPERTENSION (ICD-401.9)  stable  Orders: TLB-TSH (Thyroid Stimulating Hormone) (84443-TSH) TLB-T4 (Thyrox), Free [id number]) TLB-Hepatic/Liver Function Pnl (80076-HEPATIC)  Problem # 4:  COUMADIN THERAPY (ICD-V58.61) goal INR 2-3  Patient Instructions: 1)  Your physician recommends that you schedule a follow-up appointment in: 8 weeks with Dr Johney Frame 2)  Your physician recommends that you return for lab work today 3)  Your physician has recommended you make the following change in your medication: increase Amiodarone to two times a day for 4 weeks then back down to 200mg  daily Prescriptions: AMIODARONE HCL 200 MG TABS (AMIODARONE HCL) Take 1 tablet by mouth two times a day  #60 x 6   Entered by:   Dennis Bast, RN, BSN   Authorized by:   [name], MD   Signed by:   Dennis Bast, RN, BSN on 01/09/2010   Method used:   Electronically to        [facility]       [address]       [place], [place]  [address]       Ph: [phone]       Fax: [phone]   RxID:   [id number]

## 2010-03-26 NOTE — Progress Notes (Signed)
  Request for records received from [facility]. Request forwarded to [facility].

## 2010-03-26 NOTE — Medication Information (Signed)
Summary: rov/tm  Anticoagulant Therapy  Managed by: Shelby Dubin, PharmD, BCPS, CPP Referring MD: Sharrell Ku PCP: [name] Supervising MD: Myrtis Ser MD, Tinnie Gens Indication 1: Atrial Flutter (ICD-427.32) Indication 2: Atrial Fibrillation (ICD-427.31) Lab Used: [facility] Site: [facility] INR RANGE 2 - 3  Dietary changes: no    Health status changes: yes       Details: went out of rhythm on the 14th of January and has appointment with Dr Ladona Ridgel on the 30th  Bleeding/hemorrhagic complications: no    Recent/future hospitalizations: no    Any changes in medication regimen? no    Recent/future dental: no  Any missed doses?: no       Is patient compliant with meds? yes       Allergies (verified): 1)  ! * Ambien  Anticoagulation Management History:      The patient is taking warfarin and comes in today for a routine follow up visit.  Negative risk factors for bleeding include an age less than 42 years old.  The bleeding index is 'low risk'.  Positive CHADS2 values include History of CHF and History of HTN.  Negative CHADS2 values include Age > 34 years old.  The start date was 09/01/2002.  His last INR was 2.7 RATIO.  Anticoagulation responsible provider: Myrtis Ser MD, Tinnie Gens.  Cuvette Lot#: [id number].  Exp: 05/2010.    Anticoagulation Management Assessment/Plan:      The patient's current anticoagulation dose is Warfarin sodium 2.5 mg  tabs: Take as directed by Anticoagulation Clinic.  The target INR is 2.0-3.0.  The next INR is due 04/11/2009.  Anticoagulation instructions were given to patient.  Results were reviewed/authorized by Shelby Dubin, PharmD, BCPS, CPP.  He was notified by Ysidro Evert, Pharm D Candidate.         Prior Anticoagulation Instructions: INR 2.0 Change dose to 2.5mg s everyday except 5mg s on Mondays, Wednesdays and Saturdays. Recheck in 3 weeks.   Current Anticoagulation Instructions: INR: 2.8 Continue with same dosage of 2.5mg  daily except 5mg  on Mondays,  Wednesdays and Saturdays Recheck in 4 weeks

## 2010-03-26 NOTE — Progress Notes (Signed)
Summary: med Refill  Phone Note Refill Request  on April 30, 2009 11:20 AM  Refills Requested: Medication #1:  ALPRAZOLAM 0.5 MG TABS Take 1 tablet by mouth twice a day as needed   Dosage confirmed as above?Dosage Confirmed   Notes: [facility] [address] [phone] Initial call taken by: Scharlene Gloss,  April 30, 2009 11:20 AM  Follow-up for Phone Call        faxed. Follow-up by: Lucious Groves,  April 30, 2009 1:46 PM     done hardcopy to LIM side B - dahlia  Corwin Levins MD  April 30, 2009 1:07 PM

## 2010-03-26 NOTE — Medication Information (Signed)
Summary: rov/ez  Anticoagulant Therapy  Managed by: Eda Keys, PharmD Referring MD: Sharrell Ku PCP: [name] Supervising MD: Johney Frame MD, Fayrene Fearing Indication 1: Atrial Flutter (ICD-427.32) Indication 2: Atrial Fibrillation (ICD-427.31) Lab Used: [facility] Site: [facility] INR POC 2.2 INR RANGE 2 - 3  Dietary changes: no    Health status changes: no    Bleeding/hemorrhagic complications: no    Recent/future hospitalizations: no    Any changes in medication regimen? no    Recent/future dental: no  Any missed doses?: no       Is patient compliant with meds? yes       Allergies: 1)  ! * Ambien  Anticoagulation Management History:      The patient is taking warfarin and comes in today for a routine follow up visit.  Negative risk factors for bleeding include an age less than 31 years old.  The bleeding index is 'low risk'.  Positive CHADS2 values include History of CHF and History of HTN.  Negative CHADS2 values include Age > 48 years old.  The start date was 09/01/2002.  His last INR was 2.7 RATIO.  Anticoagulation responsible provider: Allante Whitmire MD, Fayrene Fearing.  INR POC: 2.2.  Cuvette Lot#: [id number].  Exp: 05/2010.    Anticoagulation Management Assessment/Plan:      The patient's current anticoagulation dose is Warfarin sodium 2.5 mg  tabs: Take as directed by Anticoagulation Clinic.  The target INR is 2.0-3.0.  The next INR is due 05/09/2009.  Anticoagulation instructions were given to patient.  Results were reviewed/authorized by Eda Keys, PharmD.  He was notified by Eda Keys.         Prior Anticoagulation Instructions: INR: 2.8 Continue with same dosage of 2.5mg  daily except 5mg  on Mondays, Wednesdays and Saturdays Recheck in 4 weeks  Current Anticoagulation Instructions: INR 2.2  Continue current dosing schedule.  Take 2 tablets on Monday, Wednesday, and friday, and take 1 tablet all other days. Return to clinic in 4 weeks.

## 2010-03-26 NOTE — Medication Information (Signed)
Summary: rov/sp  Anticoagulant Therapy  Managed by: Weston Brass, PharmD Referring MD: Sharrell Ku PCP: Corwin Levins MD Supervising MD: Shirlee Latch MD, [place] Indication 1: Atrial Flutter (ICD-427.32) Indication 2: Atrial Fibrillation (ICD-427.31) Lab Used: [facility] Site: [facility] INR POC 1.9 INR RANGE 2 - 3  Dietary changes: no    Health status changes: no    Bleeding/hemorrhagic complications: no    Recent/future hospitalizations: no    Any changes in medication regimen? no    Recent/future dental: no  Any missed doses?: yes     Details: Missed dose 2 weeks ago  Is patient compliant with meds? yes      Comments: Heart went out of rhythm again last night, Aug 9.  Allergies: 1)  ! * Ambien  Anticoagulation Management History:      The patient is taking warfarin and comes in today for a routine follow up visit.  Negative risk factors for bleeding include an age less than 29 years old.  The bleeding index is 'low risk'.  Positive CHADS2 values include History of CHF and History of HTN.  Negative CHADS2 values include Age > 55 years old.  The start date was 09/01/2002.  His last INR was 2.4 ratio.  Anticoagulation responsible provider: Shirlee Latch MD, [place].  INR POC: 1.9.  Cuvette Lot#: [id number].  Exp: 11/2010.    Anticoagulation Management Assessment/Plan:      The patient's current anticoagulation dose is Warfarin sodium 2.5 mg  tabs: Take as directed by Anticoagulation Clinic.  The target INR is 2.0-3.0.  The next INR is due 10/24/2009.  Anticoagulation instructions were given to patient.  Results were reviewed/authorized by Weston Brass, PharmD.  He was notified by Liana Gerold, PharmD Candidate.         Prior Anticoagulation Instructions: INR 2.1  Continue same dose of 1 tablet every day except 2 tablets on Monday.  Recheck INR in 4 weeks.   Current Anticoagulation Instructions: INR 1.9  Take 1.5 tablets today, then resume with 1 tablet daily except for 1.5 tablets on  Mondays.  Return to clinic in 3 weeks.

## 2010-03-26 NOTE — Progress Notes (Signed)
Summary: Rx req/JWJ pt  Phone Note Call from Patient Call back at Home Phone [phone]   Caller: Patient Summary of Call: pt is in clinic requesting printed Rxs for Symbicort and Lovastatin. Pt is currently in the process of appliing for pt Asst. Rx were previously requesed but they were [facility]. Rx personally given to pt Initial call taken by: Margaret Pyle, CMA,  August 06, 2009 11:03 AM    Prescriptions: SYMBICORT 160-4.5 MCG/ACT AERO (BUDESONIDE-FORMOTEROL FUMARATE) 2 puffs two times a day  #3 x 3   Entered by:   Margaret Pyle, CMA   Authorized by:   Newt Lukes MD   Signed by:   Margaret Pyle, CMA on 08/06/2009   Method used:   Print then Give to Patient   RxID:   [id number] LOVASTATIN 40 MG TABS (LOVASTATIN) 2 Tabs po  once daily  #180 x 3   Entered by:   Margaret Pyle, CMA   Authorized by:   Newt Lukes MD   Signed by:   Margaret Pyle, CMA on 08/06/2009   Method used:   Print then Give to Patient   RxID:   [id number]

## 2010-03-26 NOTE — Medication Information (Signed)
Summary: rov/mw   Anticoagulant Therapy  Managed by: Weston Brass, PharmD Referring MD: Sharrell Ku PCP: Corwin Levins MD Supervising MD: Shirlee Latch MD, [place] Indication 1: Atrial Flutter (ICD-427.32) Indication 2: Atrial Fibrillation (ICD-427.31) Lab Used: [facility] Site: [facility] INR POC 2.0 INR RANGE 2 - 3  Dietary changes: no    Health status changes: no    Bleeding/hemorrhagic complications: no    Recent/future hospitalizations: no    Any changes in medication regimen? no    Recent/future dental: no  Any missed doses?: no       Is patient compliant with meds? yes      Comments: Counseled pt to eat one fewer meal of greens per week.   Allergies: 1)  ! * Ambien  Anticoagulation Management History:      The patient is taking warfarin and comes in today for a routine follow up visit.  Negative risk factors for bleeding include an age less than 27 years old.  The bleeding index is 'low risk'.  Positive CHADS2 values include History of CHF and History of HTN.  Negative CHADS2 values include Age > 39 years old.  The start date was 09/01/2002.  His last INR was 2.4 ratio.  Anticoagulation responsible provider: Shirlee Latch MD, [place].  INR POC: 2.0.  Cuvette Lot#: [id number].  Exp: 11/2010.    Anticoagulation Management Assessment/Plan:      The patient's current anticoagulation dose is Warfarin sodium 2.5 mg  tabs: Take as directed by Anticoagulation Clinic.  The target INR is 2.0-3.0.  The next INR is due 01/21/2010.  Anticoagulation instructions were given to patient.  Results were reviewed/authorized by Weston Brass, PharmD.  He was notified by Haynes Hoehn, PharmD Candidate.         Prior Anticoagulation Instructions: INR 2.5 Continue taking 2 tablets on monday. And 1 tablet all other days. Recheck in 4 weeks.   Current Anticoagulation Instructions: INR 2.0  Continue Coumadin as scheduled:  1 tablet every day of the week, except 2 tablets on Monday.  Return to clinic in 4  weeks.

## 2010-03-26 NOTE — Miscellaneous (Signed)
Summary: Immunization Entry   Immunization History:  Influenza Immunization History:    Influenza:  historical (11/29/2009)  [facility] [address] [place] [address] Vaccine, Fluzone Site, Left Deltoid, IM Dose, 0.5 ML Date Administered 11/29/2009

## 2010-03-26 NOTE — Miscellaneous (Signed)
Summary: Cardiac CT prior to ablation  Clinical Lists Changes  Orders: Added new Referral order of Cardiac CTA (Cardiac CTA) - Signed

## 2010-03-26 NOTE — Progress Notes (Signed)
Summary: referral  Phone Note Call from Patient Call back at Home Phone [phone]   Caller: Patient Summary of Call: pt called to inform MD that ED MD says pt needs to be refered  to Cardiologist with in [facility].  Initial call taken by: Margaret Pyle, CMA,  March 05, 2009 9:08 AM  Follow-up for Phone Call        he has seen dr taylor before - will try to refer again Follow-up by: Corwin Levins MD,  March 05, 2009 9:27 AM  Additional Follow-up for Phone Call Additional follow up Details #1::        pt informed and is thankful to Dr. Jonny Ruiz and staff for prompt scheduling of appt last week and return call today. Additional Follow-up by: Margaret Pyle, CMA,  March 05, 2009 9:40 AM    Additional Follow-up for Phone Call Additional follow up Details #2::    noted/thanks Follow-up by: Corwin Levins MD,  March 05, 2009 1:56 PM

## 2010-03-26 NOTE — Assessment & Plan Note (Signed)
Summary: eph/post ablation/jml      Allergies Added:   Visit Type:  Follow-up Primary Provider:  Corwin Levins MD  CC:  sob.  History of Present Illness: [name] returns today for followup of his atrial fibrillation and diastolic CHF. The patient continues to have class 2 symptoms and occaisional episodes of palpitations.  He thinks he goes into atrial fibrillation for about 3 hours at a time.  His frequency is decreased and the episodes do not last as long.  He denies c/p or peripheral edema.  Problems Prior to Update: 1)  Dyspnea On Exertion  (ICD-786.09) 2)  Chest Pain  (ICD-786.50) 3)  Preventive Health Care  (ICD-V70.0) 4)  Knee Pain, Right  (ICD-719.46) 5)  Chronic Obstructive Pulmonary Disease, Acute Exacerbation  (ICD-491.21) 6)  COPD  (ICD-496) 7)  Dyspnea  (ICD-786.05) 8)  Chest Pain, Atypical  (ICD-786.59) 9)  Obstructive Sleep Apnea  (ICD-327.23) 10)  Preventive Health Care  (ICD-V70.0) 11)  Allergic Rhinitis  (ICD-477.9) 12)  Hypothyroidism, Acquired Nec  (ICD-244.8) 13)  Coumadin Therapy  (ICD-V58.61) 14)  Atrial Flutter  (ICD-427.32) 15)  Hyperlipidemia  (ICD-272.4) 16)  Hypertension  (ICD-401.9) 17)  Congestive Heart Failure  (ICD-428.0) 18)  Anxiety  (ICD-300.00) 19)  Atrial Fibrillation  (ICD-427.31)  Current Medications (verified): 1)  Alprazolam 0.5 Mg Tabs (Alprazolam) .... Take 1 Tablet By Mouth Twice A Day As Needed 2)  Citalopram Hydrobromide 20 Mg  Tabs (Citalopram Hydrobromide) .... Take 1 Tablet By Mouth Once A Day 3)  Temazepam 30 Mg Caps (Temazepam) .Marland Kitchen.. 1 By Mouth At Bedtime As Needed Sleep - To Fill Apr1, 2011 4)  Hydrochlorothiazide 25 Mg  Tabs (Hydrochlorothiazide) .Marland Kitchen.. 1 By Mouth Once Daily 5)  Lovastatin 40 Mg Tabs (Lovastatin) .... 2 Tabs Po  Once Daily 6)  Warfarin Sodium 2.5 Mg  Tabs (Warfarin Sodium) .... Take As Directed By Anticoagulation Clinic 7)  Enalapril Maleate 10 Mg  Tabs (Enalapril Maleate) .... Take 1 Tablet By Mouth Once A  Day 8)  Tramadol Hcl 50 Mg Tabs (Tramadol Hcl) .Marland Kitchen.. 1 - 2 By Mouth Q 6 Hrs As Needed 9)  Amlodipine Besylate 5 Mg Tabs (Amlodipine Besylate) .Marland Kitchen.. 1 By Mouth Once Daily 10)  Proair Hfa 108 (90 Base) Mcg/act Aers (Albuterol Sulfate) .... 2 Puffs Qid As Needed 11)  Symbicort 160-4.5 Mcg/act Aero (Budesonide-Formoterol Fumarate) .... 2 Puffs Two Times A Day 12)  Amiodarone Hcl 200 Mg Tabs (Amiodarone Hcl) .... Take One Tablet By Mouth Daily  Allergies (verified): 1)  ! * Ambien  Past History:  Past Medical History: Last updated: 10/04/2008 Persistent Atrial fibrillation s/p atrial fibrillation ablation 12/30/07 Typical atrial flutter status post cava-tricuspid isthmus ablation in 2005.  Anxiety Congestive heart failure Hypertension Hyperlipidemia Coumadin Therapy Allergic rhinitis Obstructive sleep apnea, compliant with CPAP.  COPD  Past Surgical History: Last updated: 04/10/2008 Hand Surgery- Left,  Radio Frequency Ablation for atrial flutter (CTI) 2005 Radio Frequency Ablation for atrial fibrillation 12/30/07  Review of Systems       The patient complains of dyspnea on exertion.  The patient denies chest pain, syncope, and peripheral edema.    Vital Signs:  Patient profile:   55 year old male Height:      66 inches Weight:      182 pounds BMI:     29.48 Pulse rate:   64 / minute BP sitting:   124 / 58  (left arm)  Vitals Entered By: Laurance Flatten CMA (Jul 19, 2009  10:48 AM)  Physical Exam  General:  Well developed, well nourished, in no acute distress. Head:  normocephalic and atraumatic Eyes:  PERRLA/EOM intact; conjunctiva and lids normal. Mouth:  Teeth, gums and palate normal. Oral mucosa normal. Neck:  Neck supple, no JVD. No masses, thyromegaly or abnormal cervical nodes. Lungs:  Clear bilaterally to auscultation with no wheezes or rhonchi. Heart:  RRR with normal S1 and S2.  PMI is not enlarged or laterally displaced. Abdomen:  Bowel sounds positive; abdomen  soft and non-tender without masses, organomegaly, or hernias noted. No hepatosplenomegaly. Msk:  Back normal, normal gait. Muscle strength and tone normal. Pulses:  pulses normal in all 4 extremities Extremities:  No clubbing or cyanosis. Neurologic:  Alert and oriented x 3.  CNII-XII intact, strength/sensation are intact   EKG  Procedure date:  07/19/2009  Findings:      Normal sinus rhythm with rate of:  64.  Impression & Recommendations:  Problem # 1:  ATRIAL FIBRILLATION (ICD-427.31) His symptoms are well controlled.  I have asked him to continue his current meds.  Would consider decreasing amiodarone if he is maintaining NSR when I see him back in 6 months. His updated medication list for this problem includes:    Warfarin Sodium 2.5 Mg Tabs (Warfarin sodium) .Marland Kitchen... Take as directed by anticoagulation clinic    Amiodarone Hcl 200 Mg Tabs (Amiodarone hcl) .Marland Kitchen... Take one tablet by mouth daily  Problem # 2:  CONGESTIVE HEART FAILURE (ICD-428.0) His diastolic CHF appears to be well controlled and class 2.  A low sodium diet is recommended. His updated medication list for this problem includes:    Hydrochlorothiazide 25 Mg Tabs (Hydrochlorothiazide) .Marland Kitchen... 1 by mouth once daily    Warfarin Sodium 2.5 Mg Tabs (Warfarin sodium) .Marland Kitchen... Take as directed by anticoagulation clinic    Enalapril Maleate 10 Mg Tabs (Enalapril maleate) .Marland Kitchen... Take 1 tablet by mouth once a day    Amlodipine Besylate 5 Mg Tabs (Amlodipine besylate) .Marland Kitchen... 1 by mouth once daily    Amiodarone Hcl 200 Mg Tabs (Amiodarone hcl) .Marland Kitchen... Take one tablet by mouth daily  Problem # 3:  COPD (ICD-496) He has minimal cough.  He will continue his inhalers. His updated medication list for this problem includes:    Proair Hfa 108 (90 Base) Mcg/act Aers (Albuterol sulfate) .Marland Kitchen... 2 puffs qid as needed    Symbicort 160-4.5 Mcg/act Aero (Budesonide-formoterol fumarate) .Marland Kitchen... 2 puffs two times a day  Other Orders: EKG w/  Interpretation (93000)  Patient Instructions: 1)  Your physician recommends that you schedule a follow-up appointment in: 01/2010

## 2010-03-26 NOTE — Medication Information (Signed)
Summary: rov/cb  Anticoagulant Therapy  Managed by: Bethena Midget, RN, BSN Referring MD: Sharrell Ku PCP: Corwin Levins MD Supervising MD: Shirlee Latch MD, Dalton Indication 1: Atrial Flutter (ICD-427.32) Indication 2: Atrial Fibrillation (ICD-427.31) Lab Used: [facility] Site: [facility] INR POC 2.5 INR RANGE 2 - 3  Dietary changes: no    Health status changes: no    Bleeding/hemorrhagic complications: no    Recent/future hospitalizations: no    Any changes in medication regimen? no    Recent/future dental: no  Any missed doses?: no       Is patient compliant with meds? yes      Comments: Last Thrus, Fri and Sat pt. states he just felt bad, very weak and tired, denied chest pain or SOB  Allergies: 1)  ! * Ambien  Anticoagulation Management History:      The patient is taking warfarin and comes in today for a routine follow up visit.  Negative risk factors for bleeding include an age less than 62 years old.  The bleeding index is 'low risk'.  Positive CHADS2 values include History of CHF and History of HTN.  Negative CHADS2 values include Age > 51 years old.  The start date was 09/01/2002.  His last INR was 2.4 ratio.  Anticoagulation responsible provider: Shirlee Latch MD, Dalton.  INR POC: 2.5.  Cuvette Lot#: [id number].  Exp: 07/2010.    Anticoagulation Management Assessment/Plan:      The patient's current anticoagulation dose is Warfarin sodium 2.5 mg  tabs: Take as directed by Anticoagulation Clinic.  The target INR is 2.0-3.0.  The next INR is due 07/24/2009.  Anticoagulation instructions were given to patient.  Results were reviewed/authorized by Bethena Midget, RN, BSN.  He was notified by Bethena Midget, RN, BSN.         Prior Anticoagulation Instructions: INR 2.3. Take 1 tablet daily except 2 tablets on Mon.  Current Anticoagulation Instructions: INR 2.5 Continue 2.5mg s daily except 5mg s on Mondays. Recheck in 4 weeks.

## 2010-03-26 NOTE — Medication Information (Signed)
Summary: [employer]   Imported By: Lester Snyder 08/23/2009 09:03:15  _____________________________________________________________________  External Attachment:    Type:   Image     Comment:   External Document

## 2010-03-26 NOTE — Medication Information (Signed)
Summary: rov/cs   Anticoagulant Therapy  Managed by: Lyna Poser, PharmD Referring MD: Sharrell Ku PCP: Corwin Levins MD Supervising MD: Shirlee Latch MD, Dalton Indication 1: Atrial Flutter (ICD-427.32) Indication 2: Atrial Fibrillation (ICD-427.31) Lab Used: [facility] Site: [facility] INR POC 2.6 INR RANGE 2 - 3  Dietary changes: no    Health status changes: no    Bleeding/hemorrhagic complications: no    Recent/future hospitalizations: no    Any changes in medication regimen? yes       Details: increased amiodarone couple weeks ago  Recent/future dental: no  Any missed doses?: no       Is patient compliant with meds? yes       Allergies: 1)  ! * Ambien  Anticoagulation Management History:      The patient is taking warfarin and comes in today for a routine follow up visit.  Negative risk factors for bleeding include an age less than 71 years old.  The bleeding index is 'low risk'.  Positive CHADS2 values include History of CHF and History of HTN.  Negative CHADS2 values include Age > 28 years old.  The start date was 09/01/2002.  His last INR was 2.4 ratio.  Anticoagulation responsible provider: Shirlee Latch MD, Dalton.  INR POC: 2.6.  Cuvette Lot#: [id number].  Exp: 11/2010.    Anticoagulation Management Assessment/Plan:      The patient's current anticoagulation dose is Warfarin sodium 2.5 mg  tabs: Take as directed by Anticoagulation Clinic.  The target INR is 2.0-3.0.  The next INR is due 02/19/2010.  Anticoagulation instructions were given to patient.  Results were reviewed/authorized by Lyna Poser, PharmD.         Prior Anticoagulation Instructions: INR 2.0  Continue Coumadin as scheduled:  1 tablet every day of the week, except 2 tablets on Monday.  Return to clinic in 4 weeks.     Current Anticoagulation Instructions: INR 2.6 Continue taking 2 tablets on monday. And 1 tablet all other days. Recheck in 4 weeks.

## 2010-03-26 NOTE — Miscellaneous (Signed)
Summary:  Chest x-ray  Clinical Lists Changes  Observations: Added new observation of CXR RESULTS:  Clinical Data: Shortness of breath, former smoker, palpitations    CHEST - 2 VIEW    Comparison: Chest x-ray of 08/25/2008    Findings: The lungs remain hyperaerated.  No active infiltrate or   effusion is seen.  Mediastinal contours are stable.  The heart is   within normal limits in size.  No acute bony abnormality is seen.    IMPRESSION:    Stable hyperaeration.  No active lung disease.    Read By:  Juline Patch,  M.D.   Released By:  Juline Patch,  M.D.  (03/02/2009 9:31)      CXR  Procedure date:  03/02/2009  Findings:       Clinical Data: Shortness of breath, former smoker, palpitations    CHEST - 2 VIEW    Comparison: Chest x-ray of 08/25/2008    Findings: The lungs remain hyperaerated.  No active infiltrate or   effusion is seen.  Mediastinal contours are stable.  The heart is   within normal limits in size.  No acute bony abnormality is seen.    IMPRESSION:    Stable hyperaeration.  No active lung disease.    Read By:  Juline Patch,  M.D.   Released By:  Juline Patch,  M.D.

## 2010-03-26 NOTE — Miscellaneous (Signed)
Summary: Orders Update  Clinical Lists Changes  Orders: Added new Service order of EKG w/ Interpretation (93000) - Signed 

## 2010-03-26 NOTE — Medication Information (Signed)
Summary: rov/eac  Anticoagulant Therapy  Managed by: Bethena Midget, RN, BSN Referring MD: Sharrell Ku PCP: Corwin Levins MD Supervising MD: Jens Som MD, Arlys John Indication 1: Atrial Flutter (ICD-427.32) Indication 2: Atrial Fibrillation (ICD-427.31) Lab Used: [facility] Site: [facility] INR POC 3.5 INR RANGE 2 - 3  Dietary changes: no    Health status changes: yes       Details: SOB, weakness- heart rate irreg.   Bleeding/hemorrhagic complications: no    Recent/future hospitalizations: no    Any changes in medication regimen? yes       Details: Amiodarone started on 04/26/09 2 tabs BID, now 1 tab BID   Recent/future dental: no  Any missed doses?: no       Is patient compliant with meds? yes      Comments: Pending 06/05/09   Allergies: 1)  ! * Ambien  Anticoagulation Management History:      The patient is taking warfarin and comes in today for a routine follow up visit.  Negative risk factors for bleeding include an age less than 58 years old.  The bleeding index is 'low risk'.  Positive CHADS2 values include History of CHF and History of HTN.  Negative CHADS2 values include Age > 20 years old.  The start date was 09/01/2002.  His last INR was 2.7 RATIO.  Anticoagulation responsible provider: Jens Som MD, Arlys John.  INR POC: 3.5.  Cuvette Lot#: [id number].  Exp: 06/2010.    Anticoagulation Management Assessment/Plan:      The patient's current anticoagulation dose is Warfarin sodium 2.5 mg  tabs: Take as directed by Anticoagulation Clinic.  The target INR is 2.0-3.0.  The next INR is due 05/16/2009.  Anticoagulation instructions were given to patient.  Results were reviewed/authorized by Bethena Midget, RN, BSN.  He was notified by Bethena Midget, RN, BSN.         Prior Anticoagulation Instructions: INR 2.2  Continue current dosing schedule.  Take 2 tablets on Monday, Wednesday, and friday, and take 1 tablet all other days. Return to clinic in 4 weeks.  Current Anticoagulation  Instructions: INR 3.5 Change dose to 2.5mg s daily except 5mg s on Mondays. Recheck in one week.

## 2010-03-26 NOTE — Assessment & Plan Note (Signed)
Summary: rov/chest pain    Visit Type:  Follow-up Primary Provider:  Hillis [name]   History of Present Illness: Scott [name] returns today for followup.  He is a middle aged man who developed a tachycardia induced CM years ago and had normalization of his LV function.  He has required fairly high dose amiodarone to maintain NSR and underwent atrial fibrillation ablation in 11/09 by Dr. Johney Frame.  He had done very well until July when he developed worsening dyspnea which came on quite suddenly but was not associated with c/p or palpitations, cough or hemoptysis.  PFT's obtained demonstrated COPD in a patient who had never smoked.   Amiodarone has been stopped.  He has had one episode of atrial fibrillation associated with c/p and sob.  He notes at other times, he also feels c/p which is not related to exertion.  Current Medications (verified): 1)  Alprazolam 0.5 Mg Tabs (Alprazolam) .... Take 1 Tablet By Mouth Twice A Day As Needed 2)  Citalopram Hydrobromide 20 Mg  Tabs (Citalopram Hydrobromide) .... Take 1 Tablet By Mouth Once A Day 3)  Temazepam 30 Mg Caps (Temazepam) .Marland Kitchen.. 1 By Mouth At Bedtime As Needed Sleep - To Fill Dec 23, 2008 4)  Hydrochlorothiazide 25 Mg  Tabs (Hydrochlorothiazide) .Marland Kitchen.. 1 By Mouth Once Daily 5)  Lovastatin 40 Mg Tabs (Lovastatin) .... 2 Tabs Po  Once Daily 6)  Warfarin Sodium 2.5 Mg  Tabs (Warfarin Sodium) .... Take As Directed By Anticoagulation Clinic 7)  Enalapril Maleate 10 Mg  Tabs (Enalapril Maleate) .... Take 1 Tablet By Mouth Once A Day 8)  Tramadol Hcl 50 Mg Tabs (Tramadol Hcl) .Marland Kitchen.. 1 - 2 By Mouth Q 6 Hrs As Needed 9)  Amlodipine Besylate 5 Mg Tabs (Amlodipine Besylate) .Marland Kitchen.. 1 By Mouth Once Daily 10)  Proair Hfa 108 (90 Base) Mcg/act Aers (Albuterol Sulfate) .... 2 Puffs Qid As Needed 11)  Symbicort 160-4.5 Mcg/act Aero (Budesonide-Formoterol Fumarate) .... 2 Puffs Two Times A Day  Allergies: 1)  ! * Ambien  Past History:  Past Medical History: Last  updated: 10/04/2008 Persistent Atrial fibrillation s/p atrial fibrillation ablation 12/30/07 Typical atrial flutter status post cava-tricuspid isthmus ablation in 2005.  Anxiety Congestive heart failure Hypertension Hyperlipidemia Coumadin Therapy Allergic rhinitis Obstructive sleep apnea, compliant with CPAP.  COPD  Past Surgical History: Last updated: 04/10/2008 Hand Surgery- Left,  Radio Frequency Ablation for atrial flutter (CTI) 2005 Radio Frequency Ablation for atrial fibrillation 12/30/07  Review of Systems  The patient denies syncope and peripheral edema.    Vital Signs:  Patient profile:   55 year old male Height:      66 inches Weight:      186 pounds BMI:     30.13 Pulse rate:   52 / minute BP sitting:   122 / 80  (left arm)  Vitals Entered By: Scott [name] CMA (March 26, 2009 1:34 PM)  Physical Exam  General:  alert and underweight appearing.  , mild breathless appearing Head:  normocephalic and atraumatic.   Eyes:  vision grossly intact, pupils equal, and pupils round.   Mouth:  no gingival abnormalities and pharynx pink and moist.   Neck:  supple and no masses.   Lungs:  midl increased RR, somewhat decreased BS to the left chest but no wheezing or rales Heart:  normal rate and regular rhythm.   Abdomen:  soft, non-tender, and normal bowel sounds.   Msk:  no joint tenderness and no joint swelling.  Pulses:  pulses normal in all 4 extremities Extremities:  no edema, no erythema  Neurologic:  alert & oriented X3 and strength normal in all extremities - moves all 4's   EKG  Procedure date:  03/26/2009  Findings:      Sinus bradycardia with rate of: 52.  First degree AV-Block noted.    Impression & Recommendations:  Problem # 1:  CHEST PAIN, ATYPICAL (ICD-786.59) His symptoms are quite atypical.  He had a stress perfusion study 17 months ago.  I have recommended a regular treadmill test. His updated medication list for this problem includes:     Warfarin Sodium 2.5 Mg Tabs (Warfarin sodium) .Marland Kitchen... Take as directed by anticoagulation clinic    Enalapril Maleate 10 Mg Tabs (Enalapril maleate) .Marland Kitchen... Take 1 tablet by mouth once a day    Amlodipine Besylate 5 Mg Tabs (Amlodipine besylate) .Marland Kitchen... 1 by mouth once daily  Orders: EKG w/ Interpretation (93000)  Problem # 2:  ATRIAL FIBRILLATION (ICD-427.31) His atrial fibrillation has been fairly quiet since his ablation.  He did have one episode associated with c/p.  I have recommended that the patient undergo watchful waiting though we may have to consider additonal anti-arrhythmic drug therapy. His updated medication list for this problem includes:    Warfarin Sodium 2.5 Mg Tabs (Warfarin sodium) .Marland Kitchen... Take as directed by anticoagulation clinic  Problem # 3:  DYSPNEA (ICD-786.05) This is mostly multifactorial. I think it is mostly due to recurrent atrial fibrillation. His updated medication list for this problem includes:    Hydrochlorothiazide 25 Mg Tabs (Hydrochlorothiazide) .Marland Kitchen... 1 by mouth once daily    Enalapril Maleate 10 Mg Tabs (Enalapril maleate) .Marland Kitchen... Take 1 tablet by mouth once a day    Amlodipine Besylate 5 Mg Tabs (Amlodipine besylate) .Marland Kitchen... 1 by mouth once daily  Problem # 4:  HYPERTENSION (ICD-401.9) A low sodium diet is recommended. He will continue his current meds. His updated medication list for this problem includes:    Hydrochlorothiazide 25 Mg Tabs (Hydrochlorothiazide) .Marland Kitchen... 1 by mouth once daily    Enalapril Maleate 10 Mg Tabs (Enalapril maleate) .Marland Kitchen... Take 1 tablet by mouth once a day    Amlodipine Besylate 5 Mg Tabs (Amlodipine besylate) .Marland Kitchen... 1 by mouth once daily  Orders: Treadmill (Treadmill)  Patient Instructions: 1)  Your physician has requested that you have an exercise tolerance test.  For further information please visit [email or web address]  Please also follow instruction sheet, as given. Next 1-2 weeks with Dr Ladona Ridgel

## 2010-03-26 NOTE — Medication Information (Signed)
Summary: rov/sp   Anticoagulant Therapy  Managed by: Reina Fuse, PharmD Referring MD: Sharrell Ku PCP: Corwin Levins MD Supervising MD: Eden Emms MD, Theron Arista Indication 1: Atrial Flutter (ICD-427.32) Indication 2: Atrial Fibrillation (ICD-427.31) Lab Used: [facility] Site: [facility] INR POC 2.6 INR RANGE 2 - 3  Dietary changes: no    Health status changes: no    Bleeding/hemorrhagic complications: no    Recent/future hospitalizations: no    Any changes in medication regimen? no    Recent/future dental: no  Any missed doses?: no       Is patient compliant with meds? yes      Comments: Has been going in and out of atrial fibrillation. Experiencing symptoms: palpitations and fatigue.   Allergies: 1)  ! * Ambien  Anticoagulation Management History:      The patient is taking warfarin and comes in today for a routine follow up visit.  Negative risk factors for bleeding include an age less than 19 years old.  The bleeding index is 'low risk'.  Positive CHADS2 values include History of CHF and History of HTN.  Negative CHADS2 values include Age > 62 years old.  The start date was 09/01/2002.  His last INR was 2.4 ratio.  Anticoagulation responsible provider: Eden Emms MD, Theron Arista.  INR POC: 2.6.  Cuvette Lot#: [id number].  Exp: 11/2010.    Anticoagulation Management Assessment/Plan:      The patient's current anticoagulation dose is Warfarin sodium 2.5 mg  tabs: Take as directed by Anticoagulation Clinic.  The target INR is 2.0-3.0.  The next INR is due 11/21/2009.  Anticoagulation instructions were given to patient.  Results were reviewed/authorized by Reina Fuse, PharmD.  He was notified by Reina Fuse PharmD.         Prior Anticoagulation Instructions: INR 1.9  Take 1.5 tablets today, then resume with 1 tablet daily except for 1.5 tablets on Mondays.  Return to clinic in 3 weeks.  Current Anticoagulation Instructions: INR 2.6  Continue taking Coumadin 1 tab (2.5 mg) on all days  except Coumadin 2 tabs (5 mg) on Mondays. Return to clinic in 4 weeks.

## 2010-03-26 NOTE — Progress Notes (Signed)
Summary: medication refill  Phone Note Refill Request Message from:  Fax from Pharmacy on September 20, 2009 11:43 AM  Refills Requested: Medication #1:  TEMAZEPAM 30 MG CAPS 1 by mouth at bedtime as needed sleep - to fill apr1   Dosage confirmed as above?Dosage Confirmed   Last Refilled: 05/25/2009   Notes: [facility], [phone]  Medication #2:  ALPRAZOLAM 0.5 MG TABS Take 1 tablet by mouth twice a day as needed   Dosage confirmed as above?Dosage Confirmed   Last Refilled: 04/30/2009   Notes: [facility], [phone] Initial call taken by: Zella Ball Ewing CMA Duncan Dull),  September 20, 2009 11:44 AM  Follow-up for Phone Call        done hardcopy to [facility] side B - dahlia  Follow-up by: Corwin Levins MD,  September 20, 2009 12:31 PM  Additional Follow-up for Phone Call Additional follow up Details #1::        Both Rxs faxed to pharmacy Additional Follow-up by: Margaret Pyle, CMA,  September 20, 2009 12:59 PM    New/Updated Medications: ALPRAZOLAM 0.5 MG TABS (ALPRAZOLAM) Take 1 tablet by mouth twice a day as needed - to fill September 22, 2009 TEMAZEPAM 30 MG CAPS (TEMAZEPAM) 1 by mouth at bedtime as needed sleep - to fill September 22, 2009 Prescriptions: ALPRAZOLAM 0.5 MG TABS (ALPRAZOLAM) Take 1 tablet by mouth twice a day as needed - to fill September 22, 2009  #60 x 3   Entered and Authorized by:   Corwin Levins MD   Signed by:   Corwin Levins MD on 09/20/2009   Method used:   Print then Give to Patient   RxID:   [id number] TEMAZEPAM 30 MG CAPS (TEMAZEPAM) 1 by mouth at bedtime as needed sleep - to fill September 22, 2009  #30 x 4   Entered and Authorized by:   Corwin Levins MD   Signed by:   Corwin Levins MD on 09/20/2009   Method used:   Print then Give to Patient   RxID:   [id number]

## 2010-03-26 NOTE — Progress Notes (Signed)
Summary: should pt take am med before ablation    Phone Note Call from Patient Call back at Home Phone [phone]   Caller: Patient Reason for Call: Talk to Nurse Summary of Call: ablation set for tomorrow, should he take his am meds Initial call taken by: Lorne Skeens,  June 04, 2009 1:51 PM  Follow-up for Phone Call        ok to take morning meds with a small sip of water. Dennis Bast, RN, BSN  June 04, 2009 2:46 PM

## 2010-03-26 NOTE — Medication Information (Signed)
Summary: rov/cb  Anticoagulant Therapy  Managed by: Elaina Pattee, PharmD Referring MD: Sharrell Ku PCP: Corwin Levins MD Supervising MD: Eden Emms MD, Theron Arista Indication 1: Atrial Flutter (ICD-427.32) Indication 2: Atrial Fibrillation (ICD-427.31) Lab Used: [facility] Site: [facility] INR POC 2.3 INR RANGE 2 - 3  Dietary changes: no    Health status changes: no    Bleeding/hemorrhagic complications: yes       Details: Knot at cath site in groin area that is tender as the day progresses.  It is bruised.  Recent/future hospitalizations: yes       Details: DC for ablation 06/06/09. Procedure went well.  Any changes in medication regimen? yes       Details: Amiodarone decreased to 200 mg daily.  Recent/future dental: no  Any missed doses?: no       Is patient compliant with meds? yes       Current Medications (verified): 1)  Alprazolam 0.5 Mg Tabs (Alprazolam) .... Take 1 Tablet By Mouth Twice A Day As Needed 2)  Citalopram Hydrobromide 20 Mg  Tabs (Citalopram Hydrobromide) .... Take 1 Tablet By Mouth Once A Day 3)  Temazepam 30 Mg Caps (Temazepam) .Marland Kitchen.. 1 By Mouth At Bedtime As Needed Sleep - To Fill Apr1, 2011 4)  Hydrochlorothiazide 25 Mg  Tabs (Hydrochlorothiazide) .Marland Kitchen.. 1 By Mouth Once Daily 5)  Lovastatin 40 Mg Tabs (Lovastatin) .... 2 Tabs Po  Once Daily 6)  Warfarin Sodium 2.5 Mg  Tabs (Warfarin Sodium) .... Take As Directed By Anticoagulation Clinic 7)  Enalapril Maleate 10 Mg  Tabs (Enalapril Maleate) .... Take 1 Tablet By Mouth Once A Day 8)  Tramadol Hcl 50 Mg Tabs (Tramadol Hcl) .Marland Kitchen.. 1 - 2 By Mouth Q 6 Hrs As Needed 9)  Amlodipine Besylate 5 Mg Tabs (Amlodipine Besylate) .Marland Kitchen.. 1 By Mouth Once Daily 10)  Proair Hfa 108 (90 Base) Mcg/act Aers (Albuterol Sulfate) .... 2 Puffs Qid As Needed 11)  Symbicort 160-4.5 Mcg/act Aero (Budesonide-Formoterol Fumarate) .... 2 Puffs Two Times A Day 12)  Amiodarone Hcl 200 Mg Tabs (Amiodarone Hcl) .... Take One Tablet By Mouth  Daily  Allergies: 1)  ! * Ambien  Anticoagulation Management History:      The patient is taking warfarin and comes in today for a routine follow up visit.  Negative risk factors for bleeding include an age less than 72 years old.  The bleeding index is 'low risk'.  Positive CHADS2 values include History of CHF and History of HTN.  Negative CHADS2 values include Age > 75 years old.  The start date was 09/01/2002.  His last INR was 2.4 ratio.  Anticoagulation responsible provider: Eden Emms MD, Theron Arista.  INR POC: 2.3.  Cuvette Lot#: [id number].  Exp: 06/2010.    Anticoagulation Management Assessment/Plan:      The patient's current anticoagulation dose is Warfarin sodium 2.5 mg  tabs: Take as directed by Anticoagulation Clinic.  The target INR is 2.0-3.0.  The next INR is due 06/26/2009.  Anticoagulation instructions were given to patient.  Results were reviewed/authorized by Elaina Pattee, PharmD.  He was notified by Elaina Pattee, PharmD.         Prior Anticoagulation Instructions: INR 2.5. Take 1 tablet daily except 2 tablets on Mondays.  Recheck after ablation.  Current Anticoagulation Instructions: INR 2.3. Take 1 tablet daily except 2 tablets on Mon.

## 2010-03-26 NOTE — Medication Information (Signed)
Summary: rov/tm  Anticoagulant Therapy  Managed by: Scott Reams, RN, BSN Referring MD: Sharrell Ku PCP: Corwin Levins MD Supervising MD: Gala Romney MD, Reuel Boom Indication 1: Atrial Flutter (ICD-427.32) Indication 2: Atrial Fibrillation (ICD-427.31) Lab Used: [facility] Site: [facility] INR POC 3.2 INR RANGE 2 - 3  Dietary changes: no    Health status changes: no    Bleeding/hemorrhagic complications: no    Recent/future hospitalizations: no    Any changes in medication regimen? no    Recent/future dental: no  Any missed doses?: no       Is patient compliant with meds? yes       Allergies (verified): 1)  ! * Ambien  Anticoagulation Management History:      The patient is taking warfarin and comes in today for a routine follow up visit.  Negative risk factors for bleeding include an age less than 38 years old.  The bleeding index is 'low risk'.  Positive CHADS2 values include History of CHF and History of HTN.  Negative CHADS2 values include Age > 61 years old.  The start date was 09/01/2002.  His last INR was 2.7 RATIO.  Anticoagulation responsible provider: Kimbra Marcelino MD, Reuel Boom.  INR POC: 3.2.  Exp: 06/2010.    Anticoagulation Management Assessment/Plan:      The patient's current anticoagulation dose is Warfarin sodium 2.5 mg  tabs: Take as directed by Anticoagulation Clinic.  The target INR is 2.0-3.0.  The next INR is due 05/23/2009.  Anticoagulation instructions were given to patient.  Results were reviewed/authorized by Scott Reams, RN, BSN.  He was notified by Scott Reams RN.         Prior Anticoagulation Instructions: INR 3.5 Change dose to 2.5mg s daily except 5mg s on Mondays. Recheck in one week.   Current Anticoagulation Instructions: INR 3.2  Continue on same dosage 1 tablet daily except 2 tablets on Mondays.  Recheck in 1 week.

## 2010-03-26 NOTE — Progress Notes (Signed)
Summary: Med Refill  Phone Note Refill Request  on May 25, 2009 8:03 AM  Refills Requested: Medication #1:  TEMAZEPAM 30 MG CAPS 1 by mouth at bedtime as needed sleep - to fill oct 30   Dosage confirmed as above?Dosage Confirmed   Notes: [facility] [phone] Initial call taken by: Scharlene Gloss,  May 25, 2009 8:03 AM  Follow-up for Phone Call        rx faxed to pharmacy Follow-up by: Margaret Pyle, CMA,  May 25, 2009 9:52 AM    New/Updated Medications: TEMAZEPAM 30 MG CAPS (TEMAZEPAM) 1 by mouth at bedtime as needed sleep - to fill apr1, 2011 Prescriptions: TEMAZEPAM 30 MG CAPS (TEMAZEPAM) 1 by mouth at bedtime as needed sleep - to fill apr1, 2011  #30 x 3   Entered and Authorized by:   Corwin Levins MD   Signed by:   Corwin Levins MD on 05/25/2009   Method used:   Print then Give to Patient   RxID:   [id number]  done hardcopy to [facility] side B - dahlia  Corwin Levins MD  May 25, 2009 9:44 AM

## 2010-03-26 NOTE — Progress Notes (Signed)
  Phone Note Other Incoming   Request: Send information Summary of Call: Request for records received from [facility], request forwarded to [facility].

## 2010-03-26 NOTE — Assessment & Plan Note (Signed)
Summary: 2wk f/u sl    Visit Type:  Follow-up Primary Provider:  [name]   History of Present Illness: [name] returns today for followup.  He is a middle aged man who developed a tachycardia induced CM years ago and had normalization of his LV function.  He has required fairly high dose amiodarone to maintain NSR and underwent atrial fibrillation ablation in 11/09 by Dr. Johney Frame.  He had done very well until July when he developed worsening dyspnea which came on quite suddenly but was not associated with c/p or palpitations, cough or hemoptysis.  PFT's obtained demonstrated COPD in a patient who had never smoked.   Amiodarone had been stopped.  The patient developed recurrent atrial fibrillation and was seen in our office for a treadmill test but was with a RVR at 150/min.  I started him back on amiodarone 400 mg daily.  He would like to come off of this medication and wonders about the possibility of a repeat ablation.  Also, I talked to him about trying Multaq or Tikosyn.  Current Medications (verified): 1)  Alprazolam 0.5 Mg Tabs (Alprazolam) .... Take 1 Tablet By Mouth Twice A Day As Needed 2)  Citalopram Hydrobromide 20 Mg  Tabs (Citalopram Hydrobromide) .... Take 1 Tablet By Mouth Once A Day 3)  Temazepam 30 Mg Caps (Temazepam) .Marland Kitchen.. 1 By Mouth At Bedtime As Needed Sleep - To Fill Dec 23, 2008 4)  Hydrochlorothiazide 25 Mg  Tabs (Hydrochlorothiazide) .Marland Kitchen.. 1 By Mouth Once Daily 5)  Lovastatin 40 Mg Tabs (Lovastatin) .... 2 Tabs Po  Once Daily 6)  Warfarin Sodium 2.5 Mg  Tabs (Warfarin Sodium) .... Take As Directed By Anticoagulation Clinic 7)  Enalapril Maleate 10 Mg  Tabs (Enalapril Maleate) .... Take 1 Tablet By Mouth Once A Day 8)  Tramadol Hcl 50 Mg Tabs (Tramadol Hcl) .Marland Kitchen.. 1 - 2 By Mouth Q 6 Hrs As Needed 9)  Amlodipine Besylate 5 Mg Tabs (Amlodipine Besylate) .Marland Kitchen.. 1 By Mouth Once Daily 10)  Proair Hfa 108 (90 Base) Mcg/act Aers (Albuterol Sulfate) .... 2 Puffs Qid As Needed 11)   Symbicort 160-4.5 Mcg/act Aero (Budesonide-Formoterol Fumarate) .... 2 Puffs Two Times A Day 12)  Amiodarone Hcl 200 Mg Tabs (Amiodarone Hcl) .... Take One Tablet By Mouth Twice A Day  Allergies: 1)  ! * Ambien  Past History:  Past Medical History: Last updated: 10/04/2008 Persistent Atrial fibrillation s/p atrial fibrillation ablation 12/30/07 Typical atrial flutter status post cava-tricuspid isthmus ablation in 2005.  Anxiety Congestive heart failure Hypertension Hyperlipidemia Coumadin Therapy Allergic rhinitis Obstructive sleep apnea, compliant with CPAP.  COPD  Past Surgical History: Last updated: 04/10/2008 Hand Surgery- Left,  Radio Frequency Ablation for atrial flutter (CTI) 2005 Radio Frequency Ablation for atrial fibrillation 12/30/07  Review of Systems       The patient complains of dyspnea on exertion.  The patient denies chest pain, syncope, and peripheral edema.    Vital Signs:  Patient profile:   55 year old male Height:      66 inches Weight:      187 pounds BMI:     30.29 Pulse rate:   62 / minute BP sitting:   110 / 70  (left arm)  Vitals Entered By: Laurance Flatten CMA (April 26, 2009 11:04 AM)  Physical Exam  General:  alert and diskempt appearing NAD. Head:  normocephalic and atraumatic.   Eyes:  vision grossly intact, pupils equal, and pupils round.   Mouth:  no gingival abnormalities and pharynx pink and moist.   Neck:  supple and no masses.   Lungs:  midl increased RR, somewhat decreased BS to the left chest but no wheezing or rales Heart:  normal rate and regular rhythm.   Abdomen:  soft, non-tender, and normal bowel sounds.   Msk:  no joint tenderness and no joint swelling.   Pulses:  pulses normal in all 4 extremities Extremities:  no edema, no erythema  Neurologic:  alert & oriented X3 and strength normal in all extremities - moves all 4's   EKG  Procedure date:  04/26/2009  Findings:      Normal sinus rhythm with rate of:   60.  Impression & Recommendations:  Problem # 1:  ATRIAL FIBRILLATION (ICD-427.31) He is now back in NSR but does not wish to stay on amiodarone and would like to consider repeat ablation and I will ask him to see Dr. Johney Frame. His updated medication list for this problem includes:    Warfarin Sodium 2.5 Mg Tabs (Warfarin sodium) .Marland Kitchen... Take as directed by anticoagulation clinic    Amiodarone Hcl 200 Mg Tabs (Amiodarone hcl) .Marland Kitchen... Take one tablet by mouth twice a day  Orders: EKG w/ Interpretation (93000)  Problem # 2:  CHEST PAIN, ATYPICAL (ICD-786.59) The etiology is unclear.  He has no CAD by Catheterization.  I have recommended watchful waiting as it is not related to exertion and may be costochondritis. His updated medication list for this problem includes:    Warfarin Sodium 2.5 Mg Tabs (Warfarin sodium) .Marland Kitchen... Take as directed by anticoagulation clinic    Enalapril Maleate 10 Mg Tabs (Enalapril maleate) .Marland Kitchen... Take 1 tablet by mouth once a day    Amlodipine Besylate 5 Mg Tabs (Amlodipine besylate) .Marland Kitchen... 1 by mouth once daily  Problem # 3:  DYSPNEA ON EXERTION (ICD-786.09) The etiology is unclear. A CT of the chest did not show evidence of pulmonary vein stenosis. He will continue on his current meds. His updated medication list for this problem includes:    Hydrochlorothiazide 25 Mg Tabs (Hydrochlorothiazide) .Marland Kitchen... 1 by mouth once daily    Enalapril Maleate 10 Mg Tabs (Enalapril maleate) .Marland Kitchen... Take 1 tablet by mouth once a day    Amlodipine Besylate 5 Mg Tabs (Amlodipine besylate) .Marland Kitchen... 1 by mouth once daily  Patient Instructions: 1)  Your physician recommends that you schedule a follow-up appointment in: Refer back to Dr. Johney Frame for redo Ablation

## 2010-03-26 NOTE — Medication Information (Signed)
Summary: rov/tm  Anticoagulant Therapy  Managed by: Weston Brass, PharmD Referring MD: Sharrell Ku PCP: Corwin Levins MD Supervising MD: Shirlee Latch MD, Vikki Gains Indication 1: Atrial Flutter (ICD-427.32) Indication 2: Atrial Fibrillation (ICD-427.31) Lab Used: [facility] Site: [facility] INR POC 2.1 INR RANGE 2 - 3  Dietary changes: no    Health status changes: no    Bleeding/hemorrhagic complications: no    Recent/future hospitalizations: no    Any changes in medication regimen? no    Recent/future dental: no  Any missed doses?: no       Is patient compliant with meds? yes       Allergies: 1)  ! * Ambien  Anticoagulation Management History:      The patient is taking warfarin and comes in today for a routine follow up visit.  Negative risk factors for bleeding include an age less than 6 years old.  The bleeding index is 'low risk'.  Positive CHADS2 values include History of CHF and History of HTN.  Negative CHADS2 values include Age > 78 years old.  The start date was 09/01/2002.  His last INR was 2.4 ratio.  Anticoagulation responsible provider: Shirlee Latch MD, Christon Gallaway.  INR POC: 2.1.  Cuvette Lot#: [id number].  Exp: 10/2010.    Anticoagulation Management Assessment/Plan:      The patient's current anticoagulation dose is Warfarin sodium 2.5 mg  tabs: Take as directed by Anticoagulation Clinic.  The target INR is 2.0-3.0.  The next INR is due 10/03/2009.  Anticoagulation instructions were given to patient.  Results were reviewed/authorized by Weston Brass, PharmD.  He was notified by Weston Brass PharmD.         Prior Anticoagulation Instructions: INR 3.0 Continue 2.5mg s daily except 5mg s on Mondays. Recheck in 2 weeks with MD visit.  Current Anticoagulation Instructions: INR 2.1  Continue same dose of 1 tablet every day except 2 tablets on Monday.  Recheck INR in 4 weeks.

## 2010-03-26 NOTE — Assessment & Plan Note (Signed)
Summary: f2m/jml  Medications Added AMIODARONE HCL 200 MG TABS (AMIODARONE HCL) Take 1/2 tablet by mouth daily        Visit Type:  Follow-up Referring Provider:  Dr Ladona Ridgel Primary Provider:  Corwin Levins MD   History of Present Illness: The patient presents today for routine electrophysiology followup. He reports doing very well since his most recent afib ablation.  He reports rare palpitations, 1-2 times per month since his ablation.   He also reports fatigue when in excessive heat.  The patient denies symptoms of chest pain, shortness of breath, orthopnea, PND, lower extremity edema, dizziness, presyncope, syncope, or neurologic sequela. The patient is tolerating medications without difficulties and is otherwise without complaint today.   Current Medications (verified): 1)  Alprazolam 0.5 Mg Tabs (Alprazolam) .... Take 1 Tablet By Mouth Twice A Day As Needed 2)  Citalopram Hydrobromide 20 Mg  Tabs (Citalopram Hydrobromide) .... Take 1 Tablet By Mouth Once A Day 3)  Temazepam 30 Mg Caps (Temazepam) .Marland Kitchen.. 1 By Mouth At Bedtime As Needed Sleep - To Fill Apr1, 2011 4)  Hydrochlorothiazide 25 Mg  Tabs (Hydrochlorothiazide) .Marland Kitchen.. 1 By Mouth Once Daily 5)  Lovastatin 40 Mg Tabs (Lovastatin) .... 2 Tabs Po  Once Daily 6)  Warfarin Sodium 2.5 Mg  Tabs (Warfarin Sodium) .... Take As Directed By Anticoagulation Clinic 7)  Enalapril Maleate 10 Mg  Tabs (Enalapril Maleate) .... Take 1 Tablet By Mouth Once A Day 8)  Tramadol Hcl 50 Mg Tabs (Tramadol Hcl) .Marland Kitchen.. 1 - 2 By Mouth Q 6 Hrs As Needed 9)  Amlodipine Besylate 5 Mg Tabs (Amlodipine Besylate) .Marland Kitchen.. 1 By Mouth Once Daily 10)  Proair Hfa 108 (90 Base) Mcg/act Aers (Albuterol Sulfate) .... 2 Puffs Qid As Needed 11)  Symbicort 160-4.5 Mcg/act Aero (Budesonide-Formoterol Fumarate) .... 2 Puffs Two Times A Day 12)  Amiodarone Hcl 200 Mg Tabs (Amiodarone Hcl) .... Take One Tablet By Mouth Daily  Allergies: 1)  ! * Ambien  Past History:  Past Medical  History: Reviewed history from 10/04/2008 and no changes required. Persistent Atrial fibrillation s/p atrial fibrillation ablation 12/30/07 Typical atrial flutter status post cava-tricuspid isthmus ablation in 2005.  Anxiety Congestive heart failure Hypertension Hyperlipidemia Coumadin Therapy Allergic rhinitis Obstructive sleep apnea, compliant with CPAP.  COPD  Past Surgical History: Reviewed history from 04/10/2008 and no changes required. Hand Surgery- Left,  Radio Frequency Ablation for atrial flutter (CTI) 2005 Radio Frequency Ablation for atrial fibrillation 12/30/07  Vital Signs:  Patient profile:   55 year old male Height:      66 inches Weight:      187 pounds BMI:     30.29 Pulse rate:   54 / minute BP sitting:   120 / 70  (left arm)  Vitals Entered By: Laurance Flatten CMA (September 05, 2009 8:31 AM)  Physical Exam  General:  Well developed, well nourished, in no acute distress. Head:  normocephalic and atraumatic Mouth:  Teeth, gums and palate normal. Oral mucosa normal. Neck:  Neck supple, no JVD. No masses, thyromegaly or abnormal cervical nodes. Lungs:  Clear bilaterally to auscultation and percussion. Heart:  Non-displaced PMI, chest non-tender; regular rate and rhythm, S1, S2 without murmurs, rubs or gallops. Carotid upstroke normal, no bruit. Normal abdominal aortic size, no bruits. Femorals normal pulses, no bruits. Pedals normal pulses. No edema, no varicosities. Abdomen:  Bowel sounds positive; abdomen soft and non-tender without masses, organomegaly, or hernias noted. No hepatosplenomegaly. Msk:  Back normal, normal gait.  Muscle strength and tone normal. Pulses:  pulses normal in all 4 extremities Extremities:  No clubbing or cyanosis. Neurologic:  Alert and oriented x 3. Skin:  Intact without lesions or rashes. Cervical Nodes:  no significant adenopathy Psych:  Normal affect.   EKG  Procedure date:  09/05/2009  Findings:      sinus bradycardia 53 bpm,  PR 226, otherwise normal  Impression & Recommendations:  Problem # 1:  ATRIAL FIBRILLATION (ICD-427.31) Doing well s/p ablation.  Afib is improved.  We will decrease amiodarone to 200mg  daily. Continue coumadin.  Problem # 2:  CONGESTIVE HEART FAILURE (ICD-428.0) stable no changes  Patient Instructions: 1)  Your physician recommends that you schedule a follow-up appointment in: 4 months with Dr Johney Frame 2)  Your physician has recommended you make the following change in your medication: decrease Amiodarone to 1/2 tablet daily

## 2010-03-26 NOTE — Progress Notes (Signed)
  Phone Note Refill Request  on July 30, 2009 9:48 AM  Refills Requested: Medication #1:  LOVASTATIN 40 MG TABS 2 Tabs po  once daily   Dosage confirmed as above?Dosage Confirmed   Notes: [facility]  Medication #2:  SYMBICORT 160-4.5 MCG/ACT AERO 2 puffs two times a day   Dosage confirmed as above?Dosage Confirmed   Notes: [facility] Initial call taken by: Scharlene Gloss,  July 30, 2009 9:49 AM    Prescriptions: SYMBICORT 160-4.5 MCG/ACT AERO (BUDESONIDE-FORMOTEROL FUMARATE) 2 puffs two times a day  #3 x 3   Entered by:   Scharlene Gloss   Authorized by:   Corwin Levins MD   Signed by:   Scharlene Gloss on 07/30/2009   Method used:   Faxed to ...       [facility] (retail)       [address]       [place], [place]  [address]       Ph: [phone]       Fax: [phone]   RxID:   [id number] LOVASTATIN 40 MG TABS (LOVASTATIN) 2 Tabs po  once daily  #60 x 8   Entered by:   Scharlene Gloss   Authorized by:   Corwin Levins MD   Signed by:   Scharlene Gloss on 07/30/2009   Method used:   Faxed to ...       [facility] (retail)       [address]       [place], [place]  [address]       Ph: [phone]       Fax: [phone]   RxID:   [id number]

## 2010-03-26 NOTE — Progress Notes (Signed)
Summary: Symbicort rx  Phone Note Other Incoming   Summary of Call: Need prescription for patient assistance. Initial call taken by: Lucious Groves,  August 14, 2009 3:48 PM  Follow-up for Phone Call        Faxed 08-14-09 and have been awaiting contact from company due to office error with 1st fax. No contact from company, closed phone note. Follow-up by: Lucious Groves,  August 20, 2009 4:35 PM    Prescriptions: SYMBICORT 160-4.5 MCG/ACT AERO (BUDESONIDE-FORMOTEROL FUMARATE) 2 puffs two times a day  #3 x 3   Entered by:   Lucious Groves   Authorized by:   Corwin Levins MD   Signed by:   Lucious Groves on 08/14/2009   Method used:   Reprint   RxID:   [id number]

## 2010-03-26 NOTE — Progress Notes (Signed)
Summary: Medication refill  Phone Note Refill Request Message from:  Fax from Pharmacy on January 22, 2010 2:28 PM  Refills Requested: Medication #1:  ALPRAZOLAM 0.5 MG TABS Take 1 tablet by mouth twice a day as needed - to fill july 30   Dosage confirmed as above?Dosage Confirmed   Last Refilled: 09/20/2009   Notes: [facility] [phone] Initial call taken by: Zella Ball Ewing CMA Duncan Dull),  January 22, 2010 2:28 PM  Follow-up for Phone Call        done hardcopy to [facility] side B - dahlia  Follow-up by: Corwin Levins MD,  January 22, 2010 2:39 PM  Additional Follow-up for Phone Call Additional follow up Details #1::        faxed hardcopy to pharmacy. Additional Follow-up by: Robin Ewing CMA Duncan Dull),  January 22, 2010 3:03 PM    New/Updated Medications: ALPRAZOLAM 0.5 MG TABS (ALPRAZOLAM) Take 1 tablet by mouth twice a day as needed - to fill Jan 22, 2010 Prescriptions: ALPRAZOLAM 0.5 MG TABS (ALPRAZOLAM) Take 1 tablet by mouth twice a day as needed - to fill Jan 22, 2010  #60 x 3   Entered and Authorized by:   Corwin Levins MD   Signed by:   Corwin Levins MD on 01/22/2010   Method used:   Print then Give to Patient   RxID:   [id number]

## 2010-03-26 NOTE — Medication Information (Signed)
Summary: [employer]  [employer]   Imported By: Lester [place] 08/10/2009 08:47:14  _____________________________________________________________________  External Attachment:    Type:   Image     Comment:   External Document

## 2010-03-26 NOTE — Assessment & Plan Note (Signed)
Summary: THINKS HEART IS OUT OF RHYTHM/ WANTED TO COME HERE INSTEAD OF...   Vital Signs:  Patient profile:   55 year old male Height:      66 inches Weight:      183 pounds BMI:     29.64 O2 Sat:      97 % on Room air Temp:     97.7 degrees F oral Pulse rate:   89 / minute BP sitting:   132 / 92  (left arm) Cuff size:   regular  Vitals Entered ByZella Ball Ewing (March 02, 2009 3:47 PM)  O2 Flow:  Room air CC: heart out of rythm/RE   Primary Care Provider:  [name]  CC:  heart out of rythm/RE.  History of Present Illness: here with onset persistent dull mid chest discomfort with breathlessness and doe/sob and mild dizzy with increased RR;  denies headache, syncope, palps, fever, ST, cough, wheezing.  CP non radaiating, without n/v, diaphoresis but "I just feel terrible"  Onset last PM, and still having discomfort now., constant.  Problems Prior to Update: 1)  Preventive Health Care  (ICD-V70.0) 2)  Knee Pain, Right  (ICD-719.46) 3)  Chronic Obstructive Pulmonary Disease, Acute Exacerbation  (ICD-491.21) 4)  COPD  (ICD-496) 5)  Dyspnea  (ICD-786.05) 6)  Chest Pain, Atypical  (ICD-786.59) 7)  Obstructive Sleep Apnea  (ICD-327.23) 8)  Preventive Health Care  (ICD-V70.0) 9)  Allergic Rhinitis  (ICD-477.9) 10)  Hypothyroidism, Acquired Nec  (ICD-244.8) 11)  Coumadin Therapy  (ICD-V58.61) 12)  Atrial Flutter  (ICD-427.32) 13)  Hyperlipidemia  (ICD-272.4) 14)  Hypertension  (ICD-401.9) 15)  Congestive Heart Failure  (ICD-428.0) 16)  Anxiety  (ICD-300.00) 17)  Atrial Fibrillation  (ICD-427.31)  Medications Prior to Update: 1)  Alprazolam 0.5 Mg Tabs (Alprazolam) .... Take 1 Tablet By Mouth Twice A Day As Needed 2)  Citalopram Hydrobromide 20 Mg  Tabs (Citalopram Hydrobromide) .... Take 1 Tablet By Mouth Once A Day 3)  Temazepam 30 Mg Caps (Temazepam) .Marland Kitchen.. 1 By Mouth At Bedtime As Needed Sleep - To Fill Dec 23, 2008 4)  Hydrochlorothiazide 25 Mg  Tabs  (Hydrochlorothiazide) .Marland Kitchen.. 1 By Mouth Once Daily 5)  Lovastatin 40 Mg Tabs (Lovastatin) .... 2 Tabs Po  Once Daily 6)  Warfarin Sodium 2.5 Mg  Tabs (Warfarin Sodium) .... Take As Directed By Anticoagulation Clinic 7)  Enalapril Maleate 10 Mg  Tabs (Enalapril Maleate) .... Take 1 Tablet By Mouth Once A Day 8)  Tramadol Hcl 50 Mg Tabs (Tramadol Hcl) .Marland Kitchen.. 1 - 2 By Mouth Q 6 Hrs As Needed 9)  Amlodipine Besylate 5 Mg Tabs (Amlodipine Besylate) .Marland Kitchen.. 1 By Mouth Once Daily 10)  Proair Hfa 108 (90 Base) Mcg/act Aers (Albuterol Sulfate) .... 2 Puffs Qid As Needed 11)  Symbicort 160-4.5 Mcg/act Aero (Budesonide-Formoterol Fumarate) .... 2 Puffs Two Times A Day  Current Medications (verified): 1)  Alprazolam 0.5 Mg Tabs (Alprazolam) .... Take 1 Tablet By Mouth Twice A Day As Needed 2)  Citalopram Hydrobromide 20 Mg  Tabs (Citalopram Hydrobromide) .... Take 1 Tablet By Mouth Once A Day 3)  Temazepam 30 Mg Caps (Temazepam) .Marland Kitchen.. 1 By Mouth At Bedtime As Needed Sleep - To Fill Dec 23, 2008 4)  Hydrochlorothiazide 25 Mg  Tabs (Hydrochlorothiazide) .Marland Kitchen.. 1 By Mouth Once Daily 5)  Lovastatin 40 Mg Tabs (Lovastatin) .... 2 Tabs Po  Once Daily 6)  Warfarin Sodium 2.5 Mg  Tabs (Warfarin Sodium) .... Take As Directed By Anticoagulation Clinic  7)  Enalapril Maleate 10 Mg  Tabs (Enalapril Maleate) .... Take 1 Tablet By Mouth Once A Day 8)  Tramadol Hcl 50 Mg Tabs (Tramadol Hcl) .Marland Kitchen.. 1 - 2 By Mouth Q 6 Hrs As Needed 9)  Amlodipine Besylate 5 Mg Tabs (Amlodipine Besylate) .Marland Kitchen.. 1 By Mouth Once Daily 10)  Proair Hfa 108 (90 Base) Mcg/act Aers (Albuterol Sulfate) .... 2 Puffs Qid As Needed 11)  Symbicort 160-4.5 Mcg/act Aero (Budesonide-Formoterol Fumarate) .... 2 Puffs Two Times A Day  Allergies (verified): 1)  ! * Ambien  Past History:  Past Medical History: Last updated: 10/04/2008 Persistent Atrial fibrillation s/p atrial fibrillation ablation 12/30/07 Typical atrial flutter status post cava-tricuspid isthmus  ablation in 2005.  Anxiety Congestive heart failure Hypertension Hyperlipidemia Coumadin Therapy Allergic rhinitis Obstructive sleep apnea, compliant with CPAP.  COPD  Past Surgical History: Last updated: 04/10/2008 Hand Surgery- Left,  Radio Frequency Ablation for atrial flutter (CTI) 2005 Radio Frequency Ablation for atrial fibrillation 12/30/07  Family History: Last updated: 04/10/2008 The patient's father has dementia and HTN.  His mother died of a stroke and  heart disease.  Social History: Last updated: 04/10/2008 The [place] friend.  He denies tobacco, alcohol, or drug use.      Risk Factors: Smoking Status: quit (01/14/2007)  Review of Systems       all otherwise negative per pt   Physical Exam  General:  alert and underweight appearing.  , mild breathless appearing Head:  normocephalic and atraumatic.   Eyes:  vision grossly intact, pupils equal, and pupils round.   Ears:  R ear normal and L ear normal.   Nose:  no external deformity and no nasal discharge.   Mouth:  no gingival abnormalities and pharynx pink and moist.   Neck:  supple and no masses.   Lungs:  midl increased RR, somewhat decreased BS to the left chest but no wheezing or rales Heart:  normal rate and regular rhythm.   Abdomen:  soft, non-tender, and normal bowel sounds.   Msk:  no joint tenderness and no joint swelling.   Extremities:  no edema, no erythema  Neurologic:  alert & oriented X3 and strength normal in all extremities - moves all 4's   Impression & Recommendations:  Problem # 1:  CHEST PAIN (ICD-786.50) atypical, but assoc with persistent discomfort and significant sob/doe /breathless without obvious cuase on  exam - diff includes ACS, PE, atypical CHF and PNA -   will check ECG and refer pt to ER for further evaluation which should likely include CXR and /or CT to r/o pulmonary emboli, and  urgent CPK and d-dimer that I am not able  to do that quickly in the office  Problem # 2:  DYSPNEA ON EXERTION (ICD-786.09)  His updated medication list for this problem includes:    Hydrochlorothiazide 25 Mg Tabs (Hydrochlorothiazide) .Marland Kitchen... 1 by mouth once daily    Enalapril Maleate 10 Mg Tabs (Enalapril maleate) .Marland Kitchen... Take 1 tablet by mouth once a day    Proair Hfa 108 (90 Base) Mcg/act Aers (Albuterol sulfate) .Marland Kitchen... 2 puffs qid as needed    Symbicort 160-4.5 Mcg/act Aero (Budesonide-formoterol fumarate) .Marland Kitchen... 2 puffs two times a day wiht hx of copd, usually good complaince with meds; treat as above, f/u any worsening signs or symptoms  Problem # 3:  HYPERTENSION (ICD-401.9)  His updated medication list for this problem includes:    Hydrochlorothiazide 25 Mg Tabs (Hydrochlorothiazide) .Marland KitchenMarland KitchenMarland KitchenMarland Kitchen  1 by mouth once daily    Enalapril Maleate 10 Mg Tabs (Enalapril maleate) .Marland Kitchen... Take 1 tablet by mouth once a day    Amlodipine Besylate 5 Mg Tabs (Amlodipine besylate) .Marland Kitchen... 1 by mouth once daily  BP today: 132/92 Prior BP: 126/70 (01/19/2009)  Labs Reviewed: K+: 3.6 (11/30/2008) Creat: : 1.5 (11/30/2008)   Chol: 183 (11/30/2008)   HDL: 39.70 (11/30/2008)   LDL: 128 (11/30/2008)   TG: 76.0 (11/30/2008) stable overall by hx and exam, ok to continue meds/tx as is   Complete Medication List: 1)  Alprazolam 0.5 Mg Tabs (Alprazolam) .... Take 1 tablet by mouth twice a day as needed 2)  Citalopram Hydrobromide 20 Mg Tabs (Citalopram hydrobromide) .... Take 1 tablet by mouth once a day 3)  Temazepam 30 Mg Caps (Temazepam) .Marland Kitchen.. 1 by mouth at bedtime as needed sleep - to fill Dec 23, 2008 4)  Hydrochlorothiazide 25 Mg Tabs (Hydrochlorothiazide) .Marland Kitchen.. 1 by mouth once daily 5)  Lovastatin 40 Mg Tabs (Lovastatin) .... 2 tabs po  once daily 6)  Warfarin Sodium 2.5 Mg Tabs (Warfarin sodium) .... Take as directed by anticoagulation clinic 7)  Enalapril Maleate 10 Mg Tabs (Enalapril maleate) .... Take 1 tablet by mouth once a day 8)  Tramadol Hcl 50 Mg  Tabs (Tramadol hcl) .Marland Kitchen.. 1 - 2 by mouth q 6 hrs as needed 9)  Amlodipine Besylate 5 Mg Tabs (Amlodipine besylate) .Marland Kitchen.. 1 by mouth once daily 10)  Proair Hfa 108 (90 Base) Mcg/act Aers (Albuterol sulfate) .... 2 puffs qid as needed 11)  Symbicort 160-4.5 Mcg/act Aero (Budesonide-formoterol fumarate) .... 2 puffs two times a day

## 2010-03-26 NOTE — Progress Notes (Signed)
Summary: REFILL  Medications Added AMIODARONE HCL 200 MG TABS (AMIODARONE HCL) Take one tablet by mouth twice a day       Phone Note Refill Request Call back at Home Phone [phone]   Refills Requested: Medication #1:  AMIODARONE 200MG  2TABS 2 X DAILY   Supply Requested: 6 months PT NEEDS TO PICK UP RX, FOR MAIL ORDER   Method Requested: Pick up at Office Initial call taken by: Migdalia Dk,  April 16, 2009 11:05 AM    New/Updated Medications: AMIODARONE HCL 200 MG TABS (AMIODARONE HCL) Take one tablet by mouth twice a day Prescriptions: AMIODARONE HCL 200 MG TABS (AMIODARONE HCL) Take one tablet by mouth twice a day  #180 x 3   Entered by:   Dennis Bast, RN, BSN   Authorized by:   Laren Boom, MD, [facility]   Signed by:   Dennis Bast, RN, BSN on 04/17/2009   Method used:   Print then Give to Patient   RxID:   [id number] AMIODARONE HCL 200 MG TABS (AMIODARONE HCL) Take one tablet by mouth twice a day  #60 x 6   Entered by:   Dennis Bast, RN, BSN   Authorized by:   Laren Boom, MD, [facility]   Signed by:   Dennis Bast, RN, BSN on 04/17/2009   Method used:   Print then Give to Patient   RxID:   [id number]

## 2010-03-26 NOTE — Medication Information (Signed)
Summary: rov/tm  Anticoagulant Therapy  Managed by: Eda Keys, PharmD Referring MD: Sharrell Ku PCP: Corwin Levins MD Supervising MD: Shirlee Latch MD, Azell Bill Indication 1: Atrial Flutter (ICD-427.32) Indication 2: Atrial Fibrillation (ICD-427.31) Lab Used: [facility] Site: [facility] INR POC 2.4 INR RANGE 2 - 3  Dietary changes: no    Health status changes: no    Bleeding/hemorrhagic complications: no    Recent/future hospitalizations: no    Any changes in medication regimen? no    Recent/future dental: no  Any missed doses?: no       Is patient compliant with meds? yes       Allergies: 1)  ! * Ambien  Anticoagulation Management History:      The patient is taking warfarin and comes in today for a routine follow up visit.  Negative risk factors for bleeding include an age less than 89 years old.  The bleeding index is 'low risk'.  Positive CHADS2 values include History of CHF and History of HTN.  Negative CHADS2 values include Age > 56 years old.  The start date was 09/01/2002.  His last INR was 2.4 ratio.  Anticoagulation responsible provider: Shirlee Latch MD, Anderson Coppock.  INR POC: 2.4.  Cuvette Lot#: [id number].  Exp: 09/2010.    Anticoagulation Management Assessment/Plan:      The patient's current anticoagulation dose is Warfarin sodium 2.5 mg  tabs: Take as directed by Anticoagulation Clinic.  The target INR is 2.0-3.0.  The next INR is due 08/21/2009.  Anticoagulation instructions were given to patient.  Results were reviewed/authorized by Eda Keys, PharmD.  He was notified by Eda Keys.         Prior Anticoagulation Instructions: INR 2.5 Continue 2.5mg s daily except 5mg s on Mondays. Recheck in 4 weeks.   Current Anticoagulation Instructions: INR 2.4  Continue taking 1 tablets on Monday and 1 tablet all other days.  Return to clinic in 4 weeks.

## 2010-03-26 NOTE — Progress Notes (Signed)
  Phone Note Outgoing Call   Call placed by: Robin Call placed to: Patient Summary of Call: Called to inform patient that his Symbicort 160/4.5  has come in through the patient assistance program and can be picked up at front desk at his convenience. Patient will be by tomorrow, September 19, 2009 to pickup. Patient received 3 boxes of Symbicort 160/4.5 Initial call taken by: Robin Ewing CMA Duncan Dull),  September 18, 2009 2:28 PM

## 2010-03-26 NOTE — Medication Information (Signed)
Summary: rov/sl   Anticoagulant Therapy  Managed by: Lyna Poser, PharmD Referring MD: Sharrell Ku PCP: Corwin Levins MD Supervising MD: Myrtis Ser MD,Jeffrey Indication 1: Atrial Flutter (ICD-427.32) Indication 2: Atrial Fibrillation (ICD-427.31) Lab Used: [facility] Site: [facility] INR POC 2.5 INR RANGE 2 - 3  Dietary changes: no    Health status changes: yes       Details: heart going out of rhythm. Talked to Dr. Ladona Ridgel about it and Dr. Ladona Ridgel made him an appointment with Dr. Johney Frame.  Bleeding/hemorrhagic complications: no    Recent/future hospitalizations: no    Any changes in medication regimen? no    Recent/future dental: no  Any missed doses?: no       Is patient compliant with meds? yes       Allergies: 1)  ! * Ambien  Anticoagulation Management History:      Negative risk factors for bleeding include an age less than 54 years old.  The bleeding index is 'low risk'.  Positive CHADS2 values include History of CHF and History of HTN.  Negative CHADS2 values include Age > 18 years old.  The start date was 09/01/2002.  His last INR was 2.4 ratio.  Anticoagulation responsible provider: Myrtis Ser MD,Jeffrey.  INR POC: 2.5.  Cuvette Lot#: [id number].  Exp: 11/2010.    Anticoagulation Management Assessment/Plan:      The patient's current anticoagulation dose is Warfarin sodium 2.5 mg  tabs: Take as directed by Anticoagulation Clinic.  The target INR is 2.0-3.0.  The next INR is due 12/19/2009.  Anticoagulation instructions were given to patient.  Results were reviewed/authorized by Lyna Poser, PharmD.         Prior Anticoagulation Instructions: INR 2.6  Continue taking Coumadin 1 tab (2.5 mg) on all days except Coumadin 2 tabs (5 mg) on Mondays. Return to clinic in 4 weeks.   Current Anticoagulation Instructions: INR 2.5 Continue taking 2 tablets on monday. And 1 tablet all other days. Recheck in 4 weeks.

## 2010-03-28 NOTE — Progress Notes (Signed)
Summary: Scott [name]  Phone Note Call from Patient Call back at Home Phone [phone] Message from:  Patient on February 19, 2010 9:40 AM  Caller: Patient Summary of Call: Pt called stating that ABX given at last OV is not helping with Bronchitis and his cough is worse. Pt is requesting new ABX Initial call taken by: Margaret Pyle, CMA,  February 19, 2010 9:42 AM  Follow-up for Phone Call        ok to change to cephalexin, but he should consider going to ER if his SOB or wheezing is worse; or any increased pain, to make sure about pna Follow-up by: Corwin Levins MD,  February 19, 2010 12:57 PM  Additional Follow-up for Phone Call Additional follow up Details #1::        Pt advised of above recommendations and Rx Additional Follow-up by: Margaret Pyle, CMA,  February 19, 2010 2:14 PM    New/Updated Medications: CEPHALEXIN 500 MG CAPS (CEPHALEXIN) 1 by mouth three times a day Prescriptions: CEPHALEXIN 500 MG CAPS (CEPHALEXIN) 1 by mouth three times a day  #30 x 0   Entered and Authorized by:   Corwin Levins MD   Signed by:   Corwin Levins MD on 02/19/2010   Method used:   Electronically to        [facility] (retail)       [address]       [place], [place]  [address]       Ph: [phone]       Fax: [phone]   RxID:   [id number]

## 2010-03-28 NOTE — Medication Information (Signed)
Summary: rov/sp   Anticoagulant Therapy  Managed by: Weston Brass, PharmD Referring MD: Sharrell Ku PCP: Corwin Levins MD Supervising MD: Shirlee Latch MD, Trajan Grove Indication 1: Atrial Flutter (ICD-427.32) Indication 2: Atrial Fibrillation (ICD-427.31) Lab Used: [facility] Site: [facility] INR POC 2.0 INR RANGE 2 - 3  Dietary changes: yes       Details: Started eating more greens  Health status changes: no    Bleeding/hemorrhagic complications: no    Recent/future hospitalizations: no    Any changes in medication regimen? no    Recent/future dental: no  Any missed doses?: no       Is patient compliant with meds? yes       Allergies: 1)  ! * Ambien  Anticoagulation Management History:      The patient is taking warfarin and comes in today for a routine follow up visit.  Negative risk factors for bleeding include an age less than 49 years old.  The bleeding index is 'low risk'.  Positive CHADS2 values include History of CHF and History of HTN.  Negative CHADS2 values include Age > 28 years old.  The start date was 09/01/2002.  His last INR was 7.5 ratio.  Anticoagulation responsible provider: Shirlee Latch MD, Graysyn Bache.  INR POC: 2.0.  Cuvette Lot#: [id number].  Exp: 03/2011.    Anticoagulation Management Assessment/Plan:      The patient's current anticoagulation dose is Warfarin sodium 2.5 mg  tabs: Take as directed by Anticoagulation Clinic.  The target INR is 2.0-3.0.  The next INR is due 04/10/2010.  Anticoagulation instructions were given to patient.  Results were reviewed/authorized by Weston Brass, PharmD.  He was notified by Linward Headland, PharmD candidate.         Prior Anticoagulation Instructions: INR 2.3  Coumadin 2.5 mg tablets - Continue to take 1 tablet every day   Current Anticoagulation Instructions: INR 2.0 (INR goal: 2-3)  Take 1 tablet everyday.  Recheck in 4 weeks.

## 2010-03-28 NOTE — Progress Notes (Signed)
Summary: ALT med  Phone Note Call from Patient Call back at [facility] Phone [phone]   Caller: Patient Summary of Call: Pt called stating that ABX given at OV yesterday is too expensive. Pt is requesting cheaper alternative Initial call taken by: Margaret Pyle, CMA,  February 13, 2010 8:11 AM  Follow-up for Phone Call        done per emr Follow-up by: Corwin Levins MD,  February 13, 2010 8:27 AM  Additional Follow-up for Phone Call Additional follow up Details #1::        Pt informed Additional Follow-up by: Margaret Pyle, CMA,  February 13, 2010 8:32 AM    New/Updated Medications: AZITHROMYCIN 250 MG TABS (AZITHROMYCIN) 2po qd for 1 day, then 1po qd for 4days, then stop Prescriptions: AZITHROMYCIN 250 MG TABS (AZITHROMYCIN) 2po qd for 1 day, then 1po qd for 4days, then stop  #6 x 1   Entered and Authorized by:   Corwin Levins MD   Signed by:   Corwin Levins MD on 02/13/2010   Method used:   Electronically to        [facility] (retail)       [address]       [place], [place]  [address]       Ph: [phone]       Fax: [phone]   RxID:   [id number]

## 2010-03-28 NOTE — Progress Notes (Addendum)
   Request received from DDS sent to [facility]  March 20, 2010 8:32 AM     Appended Document:  DDS Request received sent to [facility]

## 2010-03-28 NOTE — Medication Information (Signed)
Summary: rov/mw   Anticoagulant Therapy  Managed by: Samantha Crimes, PharmD Referring MD: Sharrell Ku PCP: Corwin Levins MD Supervising MD: Myrtis Ser MD, Tinnie Gens Indication 1: Atrial Flutter (ICD-427.32) Indication 2: Atrial Fibrillation (ICD-427.31) Lab Used: [facility] Site: [facility] PT 80.7 INR POC 7.3 INR RANGE 2 - 3  Dietary changes: no    Health status changes: no    Bleeding/hemorrhagic complications: no    Recent/future hospitalizations: no    Any changes in medication regimen? yes       Details: on course of antibiotics on yesterday  Recent/future dental: no  Any missed doses?: no       Is patient compliant with meds? yes       Allergies: 1)  ! * Ambien  Anticoagulation Management History:      Negative risk factors for bleeding include an age less than 60 years old.  The bleeding index is 'low risk'.  Positive CHADS2 values include History of CHF and History of HTN.  Negative CHADS2 values include Age > 71 years old.  The start date was 09/01/2002.  His last INR was 2.4 ratio and today's INR is 7.5.  Prothrombin time is 80.7.  Anticoagulation responsible provider: Myrtis Ser MD, Tinnie Gens.  INR POC: 7.3.  Exp: 11/2010.    Anticoagulation Management Assessment/Plan:      The patient's current anticoagulation dose is Warfarin sodium 2.5 mg  tabs: Take as directed by Anticoagulation Clinic.  The target INR is 2.0-3.0.  The next INR is due 02/19/2010.  Anticoagulation instructions were given to patient.  Results were reviewed/authorized by Samantha Crimes, PharmD.         Prior Anticoagulation Instructions: INR 2.6 Continue taking 2 tablets on monday. And 1 tablet all other days. Recheck in 4 weeks.   Current Anticoagulation Instructions: INR 7.3, pt sent to lab INR from lab 7.5, spoke with patient, told him to hold doses for three days and to begin original regimen on friday (antibiotics changed from levaquin to keflex) Samantha Crimes PharmD  February 19, 2010 2:19 PM

## 2010-03-28 NOTE — Progress Notes (Signed)
Summary: Nuclear pre procedure  Phone Note Outgoing Call Call back at [facility] Phone [phone]   Call placed by: Rea College, CMA,  March 19, 2010 4:50 PM Call placed to: Patient Summary of Call: Left message with information on [employer] Information Sheet (see scanned document for details).      Nuclear Med Background Indications for Stress Test: Evaluation for Ischemia   History: Ablation, Abnormal EKG, COPD, CT/MRI, Echo, Myocardial Perfusion Study  History Comments: '09 FAO:ZHYQMV, EF=50%; '11 Cardiac CT:n/o CAD; '11 Echo:EF=50-55%; h/o afib/flutter  Symptoms: Chest Pain with Exertion, DOE, Fatigue, Palpitations    Nuclear Pre-Procedure Cardiac Risk Factors: Family History - CAD, History of Smoking, Hypertension, Lipids Height (in): 66  Nuclear Med Study Referring MD:  Sharrell Ku

## 2010-03-28 NOTE — Medication Information (Signed)
Summary: Scott [name]  Anticoagulant Therapy  Managed by: Bethena Midget, RN, BSN Referring MD: Sharrell Ku PCP: Corwin Levins MD Supervising MD: Cassell Clement  Indication 1: Atrial Flutter (ICD-427.32) Indication 2: Atrial Fibrillation (ICD-427.31) Lab Used: [facility] Site: [address] INR POC 5.5 INR RANGE 2 - 3  Dietary changes: yes       Details: Pt states has not eaten in several days.   Health status changes: yes       Details: Flu-like symptom, N&V  Bleeding/hemorrhagic complications: no    Recent/future hospitalizations: no    Any changes in medication regimen? yes       Details: Was on Keflex 500mg  TID, Hydrocodone PRN  Recent/future dental: no  Any missed doses?: no       Is patient compliant with meds? yes       Allergies: 1)  ! * Ambien  Anticoagulation Management History:      The patient is taking warfarin and comes in today for a routine follow up visit.  Negative risk factors for bleeding include an age less than 33 years old.  The bleeding index is 'low risk'.  Positive CHADS2 values include History of CHF and History of HTN.  Negative CHADS2 values include Age > 59 years old.  The start date was 09/01/2002.  His last INR was 7.5 ratio.  Anticoagulation responsible provider: Cassell Clement .  INR POC: 5.5.  Cuvette Lot#: [id number].  Exp: 03/2011.    Anticoagulation Management Assessment/Plan:      The patient's current anticoagulation dose is Warfarin sodium 2.5 mg  tabs: Take as directed by Anticoagulation Clinic.  The target INR is 2.0-3.0.  The next INR is due 03/06/2010.  Anticoagulation instructions were given to patient.  Results were reviewed/authorized by Bethena Midget, RN, BSN.  He was notified by Bethena Midget, RN, BSN.         Prior Anticoagulation Instructions: INR 7.3, pt sent to lab INR from lab 7.5, spoke with patient, told him to hold doses for three days and to begin original regimen on friday (antibiotics changed from levaquin to  keflex) Samantha Crimes PharmD  February 19, 2010 2:19 PM    Current Anticoagulation Instructions: INR 5.5 Skip today and Thursdays dose then change dose to 1 pill everyday.  Recheck in one week.

## 2010-03-28 NOTE — Progress Notes (Signed)
Summary: Nuclear Pre-Procedure  Phone Note Outgoing Call Call back at [facility] Phone [phone]   Call placed by: Stanton Kidney, EMT-P,  March 19, 2010 1:49 PM Call placed to: Patient Action Taken: Phone Call Completed Summary of Call: Reviewed information on Myoview Information Sheet (see scanned document for further details).  Spoke with the patient. Stanton Kidney, EMT-P  March 19, 2010 1:50 PM     Nuclear Med Background Indications for Stress Test: Evaluation for Ischemia   History: Ablation, COPD, CT/MRI, Echo, Myocardial Perfusion Study   Symptoms: Chest Pain with Exertion, DOE, Fatigue with Exertion, Palpitations    Nuclear Pre-Procedure Cardiac Risk Factors: Family History - CAD, History of Smoking, Hypertension, Lipids Height (in): 66  Nuclear Med Study Referring MD:  Sharrell Ku

## 2010-03-28 NOTE — Assessment & Plan Note (Addendum)
Summary: Cardiology Nuclear Testing  Nuclear Med Background Indications for Stress Test: Evaluation for Ischemia   History: Ablation, Abnormal EKG, COPD, CT/MRI, Echo, Myocardial Perfusion Study  History Comments: '09 ZOX:WRUEAV, EF=50%; '11 Cardiac CT:n/o CAD; '11 Echo:EF=50-55%; h/o afib/flutter  Symptoms: Chest Pain with Exertion, DOE, Fatigue, Palpitations, SOB    Nuclear Pre-Procedure Cardiac Risk Factors: Family History - CAD, History of Smoking, Hypertension, Lipids Caffeine/Decaff Intake: none NPO After: 10:00 PM Lungs: clear IV 0.9% NS with Angio Cath: 18g     IV Site: R Antecubital IV Started by: Stanton Kidney, EMT-P Chest Size (in) 42     Height (in): 66 Weight (lb): 188 BMI: 30.45 Tech Comments: Patient switched to a walking Lexiscan, he couldn't get his HR up.  Nuclear Med Study 1 or 2 day study:  1 day     Stress Test Type:  Treadmill/Lexiscan Reading MD:  Cassell Clement, MD     Referring MD:  Sharrell Ku Resting Radionuclide:  Technetium 19m Tetrofosmin     Resting Radionuclide Dose:  11.0 mCi  Stress Radionuclide:  Technetium 37m Tetrofosmin     Stress Radionuclide Dose:  33.0 mCi   Stress Protocol Exercise Time (min):  10:40 min     Max HR:  122 bpm     Predicted Max HR:  166 bpm  Max Systolic BP: 187 mm Hg     Percent Max HR:  73.49 %     METS: 10.6 Rate Pressure Product:  40981    Stress Test Technologist:  Milana Na, EMT-P     Nuclear Technologist:  Doyne Keel, CNMT  Rest Procedure  Myocardial perfusion imaging was performed at rest 45 minutes following the intravenous administration of Technetium 31m Tetrofosmin.  Stress Procedure  The patient received IV Lexiscan 0.4 mg over 15-seconds with exercise and then Technetium 33m Tetrofosmin was injected at 30-seconds while the patient continued walking one more minute.  There were no significant changes with Lexiscan.  Quantitative spect images were obtained after a 45 minute delay.  QPS Raw Data  Images:  Normal; no motion artifact; normal heart/lung ratio. Stress Images:  Normal homogeneous uptake in all areas of the myocardium. Rest Images:  Normal homogeneous uptake in all areas of the myocardium. Subtraction (SDS):  No evidence of ischemia. Normal apical thinning seen on rest and stress images. Transient Ischemic Dilatation:  0.95  (Normal <1.22)  Lung/Heart Ratio:  0.25  (Normal <0.45)  Quantitative Gated Spect Images QGS EDV:  117 ml QGS ESV:  54 ml QGS EF:  54 %  Findings Normal nuclear study      Overall Impression  Exercise Capacity: Lexiscan with low level  exercise. BP Response: Normal blood pressure response. Clinical Symptoms: Moderate chest pain. ECG Impression: No significant ST segment change suggestive of ischemia. Overall Impression: Normal stress nuclear study. Overall Impression Comments: No perfusion evidence of ischemia.  Normal LV systolic function.  Appended Document: Cardiology Nuclear Testing Tresa Endo, A normal study. Normal LV function and no scar or ischemia. GT  Appended Document: Cardiology Nuclear Testing PT INFORMED

## 2010-03-28 NOTE — Assessment & Plan Note (Signed)
Summary: per check out/sf  Medications Added AMIODARONE HCL 200 MG TABS (AMIODARONE HCL) Take 1 tablet by mouth daily      Allergies Added:   Visit Type:  Follow-up Referring Provider:  Dr Ladona Ridgel Primary Provider:  Corwin Levins MD   History of Present Illness: The patient presents today for routine electrophysiology followup. He reports occasional palpitations with decreased exercise tolerance and thinks that he may be having afib.  These episodes may last several minutes to an hour.  He finds that he frequently has dypsnea with moderate activity and also admits to chest pain with exertion on occasion.  The patient denies symptoms of orthopnea, PND, lower extremity edema, dizziness, presyncope, syncope, or neurologic sequela. The patient is tolerating medications without difficulties and is otherwise without complaint today.   Current Medications (verified): 1)  Alprazolam 0.5 Mg Tabs (Alprazolam) .... Take 1 Tablet By Mouth Twice A Day As Needed - To Fill Jan 22, 2010 2)  Citalopram Hydrobromide 20 Mg  Tabs (Citalopram Hydrobromide) .... Take 1 Tablet By Mouth Once A Day 3)  Temazepam 30 Mg Caps (Temazepam) .Marland Kitchen.. 1 By Mouth At Bedtime As Needed Sleep - To Fill Feb 12, 2010 4)  Hydrochlorothiazide 25 Mg  Tabs (Hydrochlorothiazide) .Marland Kitchen.. 1 By Mouth Once Daily 5)  Lovastatin 40 Mg Tabs (Lovastatin) .... 2 Tabs Po  Once Daily 6)  Warfarin Sodium 2.5 Mg  Tabs (Warfarin Sodium) .... Take As Directed By Anticoagulation Clinic 7)  Enalapril Maleate 10 Mg  Tabs (Enalapril Maleate) .... Take 1 Tablet By Mouth Once A Day 8)  Hydrocodone-Acetaminophen 5-325 Mg Tabs (Hydrocodone-Acetaminophen) .Marland Kitchen.. 1po Two Times A Day As Needed 9)  Amlodipine Besylate 5 Mg Tabs (Amlodipine Besylate) .Marland Kitchen.. 1 By Mouth Once Daily 10)  Proair Hfa 108 (90 Base) Mcg/act Aers (Albuterol Sulfate) .... 2 Puffs Qid As Needed 11)  Symbicort 160-4.5 Mcg/act Aero (Budesonide-Formoterol Fumarate) .... 2 Puffs Two Times A Day 12)   Amiodarone Hcl 200 Mg Tabs (Amiodarone Hcl) .... Take 1 Tablet By Mouth Two Times A Day  Allergies (verified): 1)  ! * Ambien  Past History:  Past Medical History: Persistent Atrial fibrillation s/p atrial fibrillation ablation 12/30/07 and 06/05/09 Typical atrial flutter status post cava-tricuspid isthmus ablation in 2005.  Anxiety Congestive heart failure Hypertension Hyperlipidemia Coumadin Therapy Allergic rhinitis Obstructive sleep apnea, compliant with CPAP.  COPD  Past Surgical History: Hand Surgery- Left,  Radio Frequency Ablation for atrial flutter (CTI) 2005 Radio Frequency Ablation for atrial fibrillation 12/30/07 and 06/05/09  Social History: Reviewed history from 02/12/2010 and no changes required. The patient lives in [place], [place] with his friend.  He has a h/o heavy tobacco use but has not smoked in over 10 years.  He denies ETOH use.  He worked previously in the [employer] and feels that he was exposed to asbestos. Drug use-no  Review of Systems       All systems are reviewed and negative except as listed in the HPI.   Vital Signs:  Patient profile:   55 year old male Height:      66 inches Weight:      194 pounds Pulse rate:   60 / minute Pulse rhythm:   regular BP sitting:   130 / 80  (right arm)  Vitals Entered By: Jacquelin Hawking, CMA (March 13, 2010 8:57 AM)  Physical Exam  General:  NAD Head:  normocephalic and atraumatic.   Eyes:  vision grossly intact, pupils equal, and pupils round.  Mouth:  Teeth, gums and palate normal. Oral mucosa normal. Neck:  supple, JVP 8cm Lungs:  nl WOB, prolonged expiratory phase Heart:  RRR, no m/r/g Abdomen:  Bowel sounds positive; abdomen soft and non-tender without masses, organomegaly, or hernias noted. No hepatosplenomegaly. Msk:  Back normal, normal gait. Muscle strength and tone normal. Extremities:  No clubbing or cyanosis. Neurologic:  Alert and oriented x 3. Skin:  Intact without  lesions or rashes. Psych:  Normal affect.   EKG  Procedure date:  03/13/2010  Findings:      sinus bradycardia 52 bpm, PR 232, nonspecific ST/T changes  Impression & Recommendations:  Problem # 1:  DYSPNEA ON EXERTION (ICD-786.09) I am concerned that DOE and exertional chest pain may actually by coronary disease rather than afib. I reviewed his cardaic CT from 4/11 which revealed no pulmonary stenosis.  Though the study was not well timed to evaluate the coronary arteries, he has at least nonobstructive CAD and may have had more progressive CAD since that time. I would therefore recommend that we proceed with GXT myoview at this time.  If he is unable to reach target heart rate, he will need to be converted to a lexiscan study.  Problem # 2:  CHEST PAIN (ICD-786.50) as above  Problem # 3:  ATRIAL FIBRILLATION (ICD-427.31) We will place an event monitor to better characterize the frequency and nature of possible afib post ablation. Decrease amiodarone to 200mg  daily.  The patient is aware of my concerns about elevated doses of amiodarone longterm.  Problem # 4:  HYPERTENSION (ICD-401.9) stable  Other Orders: EKG w/ Interpretation (93000) Holter Monitor (Holter Monitor) Nuclear Stress Test (Nuc Stress Test)  Patient Instructions: 1)  Your physician recommends that you schedule a follow-up appointment in: 8 weeks with Dr Johney Frame 2)  Your physician has requested that you have an exercise stress myoview.  For further information please visit [email or web address]  Please follow instruction sheet, as given. 3)  Your physician has recommended that you wear a holter monitor.  Holter monitors are medical devices that record the heart's electrical activity. Doctors most often use these monitors to diagnose arrhythmias. Arrhythmias are problems with the speed or rhythm of the heartbeat. The monitor is a small, portable device. You can wear one while you do your normal daily activities. This is  usually used to diagnose what is causing palpitations/syncope (passing out). 4)  Your physician has recommended you make the following change in your medication: decrease Amiodarone to 200mg  daily

## 2010-03-28 NOTE — Medication Information (Signed)
Summary: rov/tm   Anticoagulant Therapy  Managed by: Weston Brass, PharmD Referring MD: Sharrell Ku PCP: Corwin Levins MD Supervising MD: Cassell Clement  Indication 1: Atrial Flutter (ICD-427.32) Indication 2: Atrial Fibrillation (ICD-427.31) Lab Used: [facility] Site: [facility] INR POC 2.3 INR RANGE 2 - 3  Dietary changes: yes       Details: 2 extra servings vit k  Health status changes: no    Bleeding/hemorrhagic complications: no    Recent/future hospitalizations: no    Any changes in medication regimen? no    Recent/future dental: no  Any missed doses?: no       Is patient compliant with meds? yes       Allergies: 1)  ! * Ambien  Anticoagulation Management History:      The patient is taking warfarin and comes in today for a routine follow up visit.  Negative risk factors for bleeding include an age less than 92 years old.  The bleeding index is 'low risk'.  Positive CHADS2 values include History of CHF and History of HTN.  Negative CHADS2 values include Age > 30 years old.  The start date was 09/01/2002.  His last INR was 7.5 ratio.  Anticoagulation responsible provider: Cassell Clement .  INR POC: 2.3.  Cuvette Lot#: [id number].  Exp: 03/2011.    Anticoagulation Management Assessment/Plan:      The patient's current anticoagulation dose is Warfarin sodium 2.5 mg  tabs: Take as directed by Anticoagulation Clinic.  The target INR is 2.0-3.0.  The next INR is due 03/13/2010.  Anticoagulation instructions were given to patient.  Results were reviewed/authorized by Weston Brass, PharmD.  He was notified by Stephannie Peters, PharmD Candidate .         Prior Anticoagulation Instructions: INR 5.5 Skip today and Thursdays dose then change dose to 1 pill everyday.  Recheck in one week.   Current Anticoagulation Instructions: INR 2.3  Coumadin 2.5 mg tablets - Continue to take 1 tablet every day

## 2010-03-28 NOTE — Assessment & Plan Note (Signed)
Summary: COUGH/COLD/CD   Vital Signs:  Patient profile:   55 year old male Height:      66 inches Weight:      193.38 pounds BMI:     31.33 O2 Sat:      94 % on Room air Temp:     98.7 degrees F oral Pulse rate:   90 / minute BP sitting:   118 / 72  (left arm) Cuff size:   regular  Vitals Entered By: Zella Ball Ewing CMA Duncan Dull) (February 12, 2010 2:14 PM)  O2 Flow:  Room air CC: Cough, congestion, sore throat/RE   Primary Care Merranda Bolls:  Corwin Levins MD  CC:  Cough, congestion, and sore throat/RE.  History of Present Illness: here to f/u; overall not doing well now with acute onset mild to mod 3 days fever, slight sT, adn now increasingly prod cough greenish sputum, but Pt denies CP, worsening sob, doe, wheezing, orthopnea, pnd, worsening LE edema, palps, dizziness or syncope  Pt denies new neuro symptoms such as headache, facial or extremity weakness  Pt denies polydipsia, polyuria  Overall good compliance with meds, trying to follow low chol  diet, wt stable, littlle excericse however.  Also with ongoing knee pain, moderate to severe, has to stand all day at work and does not want to quit;  tramadol just not working No recent falls or injury.    Preventive Screening-Counseling & Management      Drug Use:  no.    Problems Prior to Update: 1)  Bronchitis-acute  (ICD-466.0) 2)  Dyspnea On Exertion  (ICD-786.09) 3)  Chest Pain  (ICD-786.50) 4)  Preventive Health Care  (ICD-V70.0) 5)  Knee Pain, Right  (ICD-719.46) 6)  Chronic Obstructive Pulmonary Disease, Acute Exacerbation  (ICD-491.21) 7)  COPD  (ICD-496) 8)  Dyspnea  (ICD-786.05) 9)  Chest Pain, Atypical  (ICD-786.59) 10)  Obstructive Sleep Apnea  (ICD-327.23) 11)  Preventive Health Care  (ICD-V70.0) 12)  Allergic Rhinitis  (ICD-477.9) 13)  Hypothyroidism, Acquired Nec  (ICD-244.8) 14)  Coumadin Therapy  (ICD-V58.61) 15)  Atrial Flutter  (ICD-427.32) 16)  Hyperlipidemia  (ICD-272.4) 17)  Hypertension  (ICD-401.9) 18)   Congestive Heart Failure  (ICD-428.0) 19)  Anxiety  (ICD-300.00) 20)  Atrial Fibrillation  (ICD-427.31)  Medications Prior to Update: 1)  Alprazolam 0.5 Mg Tabs (Alprazolam) .... Take 1 Tablet By Mouth Twice A Day As Needed - To Fill Jan 22, 2010 2)  Citalopram Hydrobromide 20 Mg  Tabs (Citalopram Hydrobromide) .... Take 1 Tablet By Mouth Once A Day 3)  Temazepam 30 Mg Caps (Temazepam) .Marland Kitchen.. 1 By Mouth At Bedtime As Needed Sleep - To [facility] September 22, 2009 4)  Hydrochlorothiazide 25 Mg  Tabs (Hydrochlorothiazide) .Marland Kitchen.. 1 By Mouth Once Daily 5)  Lovastatin 40 Mg Tabs (Lovastatin) .... 2 Tabs Po  Once Daily 6)  Warfarin Sodium 2.5 Mg  Tabs (Warfarin Sodium) .... Take As Directed By Anticoagulation Clinic 7)  Enalapril Maleate 10 Mg  Tabs (Enalapril Maleate) .... Take 1 Tablet By Mouth Once A Day 8)  Tramadol Hcl 50 Mg Tabs (Tramadol Hcl) .Marland Kitchen.. 1 - 2 By Mouth Q 6 Hrs As Needed 9)  Amlodipine Besylate 5 Mg Tabs (Amlodipine Besylate) .Marland Kitchen.. 1 By Mouth Once Daily 10)  Proair Hfa 108 (90 Base) Mcg/act Aers (Albuterol Sulfate) .... 2 Puffs Qid As Needed 11)  Symbicort 160-4.5 Mcg/act Aero (Budesonide-Formoterol Fumarate) .... 2 Puffs Two Times A Day 12)  Amiodarone Hcl 200 Mg Tabs (Amiodarone Hcl) .... Take  1 Tablet By Mouth Two Times A Day  Current Medications (verified): 1)  Alprazolam 0.5 Mg Tabs (Alprazolam) .... Take 1 Tablet By Mouth Twice A Day As Needed - To Fill Jan 22, 2010 2)  Citalopram Hydrobromide 20 Mg  Tabs (Citalopram Hydrobromide) .... Take 1 Tablet By Mouth Once A Day 3)  Temazepam 30 Mg Caps (Temazepam) .Marland Kitchen.. 1 By Mouth At Bedtime As Needed Sleep - To Fill Feb 12, 2010 4)  Hydrochlorothiazide 25 Mg  Tabs (Hydrochlorothiazide) .Marland Kitchen.. 1 By Mouth Once Daily 5)  Lovastatin 40 Mg Tabs (Lovastatin) .... 2 Tabs Po  Once Daily 6)  Warfarin Sodium 2.5 Mg  Tabs (Warfarin Sodium) .... Take As Directed By Anticoagulation Clinic 7)  Enalapril Maleate 10 Mg  Tabs (Enalapril Maleate) .... Take 1 Tablet By  Mouth Once A Day 8)  Hydrocodone-Acetaminophen 5-325 Mg Tabs (Hydrocodone-Acetaminophen) .Marland Kitchen.. 1po Two Times A Day As Needed 9)  Amlodipine Besylate 5 Mg Tabs (Amlodipine Besylate) .Marland Kitchen.. 1 By Mouth Once Daily 10)  Proair Hfa 108 (90 Base) Mcg/act Aers (Albuterol Sulfate) .... 2 Puffs Qid As Needed 11)  Symbicort 160-4.5 Mcg/act Aero (Budesonide-Formoterol Fumarate) .... 2 Puffs Two Times A Day 12)  Amiodarone Hcl 200 Mg Tabs (Amiodarone Hcl) .... Take 1 Tablet By Mouth Two Times A Day 13)  Levofloxacin 500 Mg Tabs (Levofloxacin) .Marland Kitchen.. 1 By Mouth Once Daily  Allergies (verified): 1)  ! * Ambien  Past History:  Past Medical History: Last updated: 10/04/2008 Persistent Atrial fibrillation s/p atrial fibrillation ablation 12/30/07 Typical atrial flutter status post cava-tricuspid isthmus ablation in 2005.  Anxiety Congestive heart failure Hypertension Hyperlipidemia Coumadin Therapy Allergic rhinitis Obstructive sleep apnea, compliant with CPAP.  COPD  Past Surgical History: Last updated: 04/10/2008 Hand Surgery- Left,  Radio Frequency Ablation for atrial flutter (CTI) 2005 Radio Frequency Ablation for atrial fibrillation 12/30/07  Social History: Last updated: 02/12/2010 The patient lives in [place], [place] with his friend.  He has a h/o heavy tobacco use but has not smoked in over 10 years.  He denies ETOH use.  He worked previously in the [employer] and feels that he was exposed to asbestos. Drug use-no  Risk Factors: Smoking Status: quit (01/14/2007)  Social History: The patient lives in [place], [place] with his friend.  He has a h/o heavy tobacco use but has not smoked in over 10 years.  He denies ETOH use.  He worked previously in the [employer] and feels that he was exposed to asbestos. Drug use-no Drug Use:  no  Review of Systems       all otherwise negative per pt -    Physical Exam  General:  alert and underweight appearing.  , mild  breathless appearing Head:  normocephalic and atraumatic.   Eyes:  vision grossly intact, pupils equal, and pupils round.   Ears:  bilat tm's red, sinus nontender Nose:  nasal dischargemucosal pallor and mucosal edema.   Mouth:  pharyngeal erythema and fair dentition.   Neck:  supple and no masses.   Lungs:  normal respiratory effort, R decreased breath sounds, and L decreased breath sounds.  but no wheezes or rales Heart:  normal rate and regular rhythm.   Msk:  no joint tenderness and no joint swelling.  except for right knee bony changes , without effusion or tenderness Extremities:  no edema, no erythema    Impression & Recommendations:  Problem # 1:  BRONCHITIS-ACUTE (ICD-466.0)  His updated medication list for this problem includes:  Proair Hfa 108 (90 Base) Mcg/act Aers (Albuterol sulfate) .Marland Kitchen... 2 puffs qid as needed    Symbicort 160-4.5 Mcg/act Aero (Budesonide-formoterol fumarate) .Marland Kitchen... 2 puffs two times a day    Levofloxacin 500 Mg Tabs (Levofloxacin) .Marland Kitchen... 1 by mouth once daily  Orders: T-2 View CXR, Same Day (71020.5TC)  Problem # 2:  KNEE PAIN, RIGHT (ICD-719.46)  His updated medication list for this problem includes:    Hydrocodone-acetaminophen 5-325 Mg Tabs (Hydrocodone-acetaminophen) .Marland Kitchen... 1po two times a day as needed mod to severe, no change, stands all day at work, tramadol not working - change to [employer] as needed , will need ortho f/u  Problem # 3:  COPD (ICD-496)  His updated medication list for this problem includes:    Proair Hfa 108 (90 Base) Mcg/act Aers (Albuterol sulfate) .Marland Kitchen... 2 puffs qid as needed    Symbicort 160-4.5 Mcg/act Aero (Budesonide-formoterol fumarate) .Marland Kitchen... 2 puffs two times a day stable overall by hx and exam, ok to continue meds/tx as is  - no steroid needed at this time   Problem # 4:  HYPERTENSION (ICD-401.9)  His updated medication list for this problem includes:    Hydrochlorothiazide 25 Mg Tabs (Hydrochlorothiazide) .Marland Kitchen...  1 by mouth once daily    Enalapril Maleate 10 Mg Tabs (Enalapril maleate) .Marland Kitchen... Take 1 tablet by mouth once a day    Amlodipine Besylate 5 Mg Tabs (Amlodipine besylate) .Marland Kitchen... 1 by mouth once daily  BP today: 118/72 Prior BP: 122/80 (01/09/2010)  Labs Reviewed: K+: 3.7 (05/29/2009) Creat: : 1.2 (05/29/2009)   Chol: 183 (11/30/2008)   HDL: 39.70 (11/30/2008)   LDL: 128 (11/30/2008)   TG: 76.0 (11/30/2008) stable overall by hx and exam, ok to continue meds/tx as is   Complete Medication List: 1)  Alprazolam 0.5 Mg Tabs (Alprazolam) .... Take 1 tablet by mouth twice a day as needed - to fill Jan 22, 2010 2)  Citalopram Hydrobromide 20 Mg Tabs (Citalopram hydrobromide) .... Take 1 tablet by mouth once a day 3)  Temazepam 30 Mg Caps (Temazepam) .Marland Kitchen.. 1 by mouth at bedtime as needed sleep - to fill Feb 12, 2010 4)  Hydrochlorothiazide 25 Mg Tabs (Hydrochlorothiazide) .Marland Kitchen.. 1 by mouth once daily 5)  Lovastatin 40 Mg Tabs (Lovastatin) .... 2 tabs po  once daily 6)  Warfarin Sodium 2.5 Mg Tabs (Warfarin sodium) .... Take as directed by anticoagulation clinic 7)  Enalapril Maleate 10 Mg Tabs (Enalapril maleate) .... Take 1 tablet by mouth once a day 8)  Hydrocodone-acetaminophen 5-325 Mg Tabs (Hydrocodone-acetaminophen) .Marland Kitchen.. 1po two times a day as needed 9)  Amlodipine Besylate 5 Mg Tabs (Amlodipine besylate) .Marland Kitchen.. 1 by mouth once daily 10)  Proair Hfa 108 (90 Base) Mcg/act Aers (Albuterol sulfate) .... 2 puffs qid as needed 11)  Symbicort 160-4.5 Mcg/act Aero (Budesonide-formoterol fumarate) .... 2 puffs two times a day 12)  Amiodarone Hcl 200 Mg Tabs (Amiodarone hcl) .... Take 1 tablet by mouth two times a day 13)  Levofloxacin 500 Mg Tabs (Levofloxacin) .Marland Kitchen.. 1 by mouth once daily  Patient Instructions: 1)  Please take all new medications as prescribed - the levofloxocin antibiotic 2)  You can also use Delsym OTC or it's generic for cough 3)  stop the tramadol 4)  start the hydrocodone as  prescribed as needed for pain 5)  Please go to Radiology in the basement level for your X-Ray today  6)  Please call the number on the [facility] Card for results of your testing  7)  Continue all other previous medications as before this visit  8)  Please schedule a follow-up appointment in 2 months for CPX with labs Prescriptions: TEMAZEPAM 30 MG CAPS (TEMAZEPAM) 1 by mouth at bedtime as needed sleep - to fill Feb 12, 2010  #30 x 5   Entered and Authorized by:   Corwin Levins MD   Signed by:   Corwin Levins MD on 02/12/2010   Method used:   Print then Give to Patient   RxID:   [id number] HYDROCODONE-ACETAMINOPHEN 5-325 MG TABS (HYDROCODONE-ACETAMINOPHEN) 1po two times a day as needed  #60 x 2   Entered and Authorized by:   Corwin Levins MD   Signed by:   Corwin Levins MD on 02/12/2010   Method used:   Print then Give to Patient   RxID:   [id number] LEVOFLOXACIN 500 MG TABS (LEVOFLOXACIN) 1 by mouth once daily  #10 x 0   Entered and Authorized by:   Corwin Levins MD   Signed by:   Corwin Levins MD on 02/12/2010   Method used:   Print then Give to Patient   RxID:   [id number]    Orders Added: 1)  T-2 View CXR, Same Day [71020.5TC] 2)  Est. Patient Level IV [59563]

## 2010-03-28 NOTE — Letter (Signed)
Summary: Primary Care Appointment Letter  [facility]  [address] [place], [place] [address]   Phone: [phone]  Fax: [phone]    03/15/2010 MRN: [id number]  [facility] [address] [place], [place]  [address]  Dear [name],   Your Primary Care Physician Corwin Levins MD has indicated that:    _______it is time to schedule an appointment.    _______you missed your appointment on______ and need to call and          reschedule.    _______you need to have lab work done.    _______you need to schedule an appointment discuss lab or test results.    ___X____you need to call to reschedule your appointment that is                       scheduled on May 15, 2010 with Dr. Jonny Ruiz.  Please call the office.     Please call our office as soon as possible. Our phone number is [phone]. Please press option 1. Our office is open 8a-12noon and 1p-5p, Monday through Friday.     Thank you,    [facility]

## 2010-04-03 NOTE — Letter (Signed)
Summary: [facility]  [facility]   Imported By: Lenard Forth 03/27/2010 13:07:40  _____________________________________________________________________  External Attachment:    Type:   Image     Comment:   External Document

## 2010-04-03 NOTE — Letter (Signed)
Summary: [facility]  [facility]   Imported By: Lenard Forth 03/27/2010 13:06:50  _____________________________________________________________________  External Attachment:    Type:   Image     Comment:   External Document

## 2010-04-08 DIAGNOSIS — Z7901 Long term (current) use of anticoagulants: Secondary | ICD-10-CM | POA: Insufficient documentation

## 2010-04-08 DIAGNOSIS — I4891 Unspecified atrial fibrillation: Secondary | ICD-10-CM

## 2010-04-08 DIAGNOSIS — I4892 Unspecified atrial flutter: Secondary | ICD-10-CM

## 2010-04-08 HISTORY — DX: Long term (current) use of anticoagulants: Z79.01

## 2010-04-10 ENCOUNTER — Encounter: Payer: Self-pay | Admitting: Cardiology

## 2010-04-10 ENCOUNTER — Encounter (INDEPENDENT_AMBULATORY_CARE_PROVIDER_SITE_OTHER): Payer: Self-pay

## 2010-04-10 DIAGNOSIS — Z7901 Long term (current) use of anticoagulants: Secondary | ICD-10-CM

## 2010-04-10 DIAGNOSIS — I4892 Unspecified atrial flutter: Secondary | ICD-10-CM

## 2010-04-10 DIAGNOSIS — I4891 Unspecified atrial fibrillation: Secondary | ICD-10-CM

## 2010-04-10 LAB — CONVERTED CEMR LAB: POC INR: 2.1

## 2010-04-17 NOTE — Medication Information (Signed)
Summary: Coumadin Clinic   Anticoagulant Therapy  Managed by: Weston Brass, PharmD Referring MD: Sharrell Ku PCP: Corwin Levins MD Supervising MD: Shirlee Latch MD, Keona Sheffler Indication 1: Atrial Flutter (ICD-427.32) Indication 2: Atrial Fibrillation (ICD-427.31) Lab Used: [facility] Site: [facility] INR POC 2.1 INR RANGE 2 - 3  Dietary changes: no    Health status changes: no    Bleeding/hemorrhagic complications: no    Recent/future hospitalizations: no    Any changes in medication regimen? no    Recent/future dental: no  Any missed doses?: no       Is patient compliant with meds? yes       Allergies: 1)  ! * Ambien  Anticoagulation Management History:      The patient is taking warfarin and comes in today for a routine follow up visit.  Negative risk factors for bleeding include an age less than 10 years old.  The bleeding index is 'low risk'.  Positive CHADS2 values include History of CHF and History of HTN.  Negative CHADS2 values include Age > 67 years old.  The start date was 09/01/2002.  His last INR was 7.5 ratio.  Anticoagulation responsible provider: Shirlee Latch MD, Chief Walkup.  INR POC: 2.1.  Cuvette Lot#: [id number].  Exp: 02/2011.    Anticoagulation Management Assessment/Plan:      The patient's current anticoagulation dose is Warfarin sodium 2.5 mg  tabs: Take as directed by Anticoagulation Clinic.  The target INR is 2.0-3.0.  The next INR is due 05/08/2010.  Anticoagulation instructions were given to patient.  Results were reviewed/authorized by Weston Brass, PharmD.  He was notified by Margot Chimes PharmD Candidate.         Prior Anticoagulation Instructions: INR 2.0 (INR goal: 2-3)  Take 1 tablet everyday.  Recheck in 4 weeks.  Current Anticoagulation Instructions: INR 2.1    Continue to take 1 tablet everyday.  Recheck INR in 4 weeks.

## 2010-04-23 ENCOUNTER — Encounter: Payer: Self-pay | Admitting: Internal Medicine

## 2010-05-07 NOTE — Letter (Signed)
Summary: Exam Performed by [facility] / [facility]  Exam Performed by [facility]   Imported By: Lennie Odor 04/30/2010 15:36:58  _____________________________________________________________________  External Attachment:    Type:   Image     Comment:   External Document

## 2010-05-08 ENCOUNTER — Other Ambulatory Visit: Payer: Self-pay

## 2010-05-08 ENCOUNTER — Encounter (INDEPENDENT_AMBULATORY_CARE_PROVIDER_SITE_OTHER): Payer: Self-pay | Admitting: *Deleted

## 2010-05-09 ENCOUNTER — Encounter: Payer: Self-pay | Admitting: Internal Medicine

## 2010-05-09 ENCOUNTER — Ambulatory Visit (INDEPENDENT_AMBULATORY_CARE_PROVIDER_SITE_OTHER): Payer: PRIVATE HEALTH INSURANCE | Admitting: Internal Medicine

## 2010-05-09 ENCOUNTER — Encounter (INDEPENDENT_AMBULATORY_CARE_PROVIDER_SITE_OTHER): Payer: PRIVATE HEALTH INSURANCE

## 2010-05-09 DIAGNOSIS — I4892 Unspecified atrial flutter: Secondary | ICD-10-CM

## 2010-05-09 DIAGNOSIS — I4891 Unspecified atrial fibrillation: Secondary | ICD-10-CM

## 2010-05-09 DIAGNOSIS — Z7901 Long term (current) use of anticoagulants: Secondary | ICD-10-CM

## 2010-05-09 LAB — CONVERTED CEMR LAB: POC INR: 2.4

## 2010-05-12 LAB — DIFFERENTIAL
Lymphs Abs: 1.1 10*3/uL (ref 0.7–4.0)
Monocytes Absolute: 0.4 10*3/uL (ref 0.1–1.0)
Monocytes Relative: 7 % (ref 3–12)
Neutro Abs: 4.4 10*3/uL (ref 1.7–7.7)
Neutrophils Relative %: 72 % (ref 43–77)

## 2010-05-12 LAB — COMPREHENSIVE METABOLIC PANEL
ALT: 24 U/L (ref 0–53)
Albumin: 3.8 g/dL (ref 3.5–5.2)
Alkaline Phosphatase: 49 U/L (ref 39–117)
Calcium: 8.9 mg/dL (ref 8.4–10.5)
Glucose, Bld: 85 mg/dL (ref 70–99)
Potassium: 3.2 mEq/L — ABNORMAL LOW (ref 3.5–5.1)
Sodium: 137 mEq/L (ref 135–145)
Total Protein: 6.8 g/dL (ref 6.0–8.3)

## 2010-05-12 LAB — CBC
Platelets: 176 10*3/uL (ref 150–400)
RBC: 4.84 MIL/uL (ref 4.22–5.81)
WBC: 6.2 10*3/uL (ref 4.0–10.5)

## 2010-05-12 LAB — CK TOTAL AND CKMB (NOT AT ARMC)
CK, MB: 2.8 ng/mL (ref 0.3–4.0)
Relative Index: 1.3 (ref 0.0–2.5)
Total CK: 211 U/L (ref 7–232)

## 2010-05-12 LAB — PROTIME-INR
INR: 2.44 — ABNORMAL HIGH (ref 0.00–1.49)
Prothrombin Time: 26.3 seconds — ABNORMAL HIGH (ref 11.6–15.2)

## 2010-05-14 NOTE — Assessment & Plan Note (Signed)
Summary: follow up/2:45/PER CHECK OUT/SF/NR  Medications Added NADOLOL 20 MG TABS (NADOLOL) one by mouth daily      Allergies Added:   Visit Type:  Follow-up Referring Provider:  Dr Ladona Ridgel Primary Provider:  Corwin Levins MD   History of Present Illness: The patient presents today for routine electrophysiology followup. He continues to report occasional palpitations, typically lasting only several minutes.  He has stable dypsnea with moderate activity.  He has no further chest pain.  The patient denies symptoms of orthopnea, PND, lower extremity edema, dizziness, presyncope, syncope, or neurologic sequela. The patient is tolerating medications without difficulties and is otherwise without complaint today.   Current Medications (verified): 1)  Alprazolam 0.5 Mg Tabs (Alprazolam) .... Take 1 Tablet By Mouth Twice A Day As Needed - To Fill Jan 22, 2010 2)  Citalopram Hydrobromide 20 Mg  Tabs (Citalopram Hydrobromide) .... Take 1 Tablet By Mouth Once A Day 3)  Temazepam 30 Mg Caps (Temazepam) .Marland Kitchen.. 1 By Mouth At Bedtime As Needed Sleep - To Fill Feb 12, 2010 4)  Hydrochlorothiazide 25 Mg  Tabs (Hydrochlorothiazide) .Marland Kitchen.. 1 By Mouth Once Daily 5)  Lovastatin 40 Mg Tabs (Lovastatin) .... 2 Tabs Po  Once Daily 6)  Warfarin Sodium 2.5 Mg  Tabs (Warfarin Sodium) .... Take As Directed By Anticoagulation Clinic 7)  Enalapril Maleate 10 Mg  Tabs (Enalapril Maleate) .... Take 1 Tablet By Mouth Once A Day 8)  Hydrocodone-Acetaminophen 5-325 Mg Tabs (Hydrocodone-Acetaminophen) .Marland Kitchen.. 1po Two Times A Day As Needed 9)  Amlodipine Besylate 5 Mg Tabs (Amlodipine Besylate) .Marland Kitchen.. 1 By Mouth Once Daily 10)  Proair Hfa 108 (90 Base) Mcg/act Aers (Albuterol Sulfate) .... 2 Puffs Qid As Needed 11)  Symbicort 160-4.5 Mcg/act Aero (Budesonide-Formoterol Fumarate) .... 2 Puffs Two Times A Day 12)  Amiodarone Hcl 200 Mg Tabs (Amiodarone Hcl) .... Take 1 Tablet By Mouth Daily  Allergies (verified): 1)  ! *  Ambien  Past History:  Past Medical History: Reviewed history from 03/13/2010 and no changes required. Persistent Atrial fibrillation s/p atrial fibrillation ablation 12/30/07 and 06/05/09 Typical atrial flutter status post cava-tricuspid isthmus ablation in 2005.  Anxiety Congestive heart failure Hypertension Hyperlipidemia Coumadin Therapy Allergic rhinitis Obstructive sleep apnea, compliant with CPAP.  COPD  Past Surgical History: Reviewed history from 03/13/2010 and no changes required. Hand Surgery- Left,  Radio Frequency Ablation for atrial flutter (CTI) 2005 Radio Frequency Ablation for atrial fibrillation 12/30/07 and 06/05/09  Social History: Reviewed history from 02/12/2010 and no changes required. The [place] friend.  He has a h/o heavy tobacco use but has not smoked in over 10 years.  He denies ETOH use.  He worked previously in the [employer] and feels that he was exposed to asbestos. Drug use-no  Review of Systems       All systems are reviewed and negative except as listed in the HPI.   Vital Signs:  Patient profile:   55 year old male Height:      66 inches Weight:      195 pounds BMI:     31.59 Pulse rate:   64 / minute BP sitting:   130 / 74  (left arm)  Vitals Entered By: Laurance Flatten CMA (May 09, 2010 2:37 PM)  Physical Exam  General:  NAD Head:  normocephalic and atraumatic.   Eyes:  vision grossly intact, pupils equal, and pupils round.   Mouth:  Teeth, gums and palate normal.  Oral mucosa normal. Neck:  supple, JVP 8cm Lungs:  Clear bilaterally to auscultation and percussion. Heart:  RRR, no m/r/g Abdomen:  Bowel sounds positive; abdomen soft and non-tender without masses, organomegaly, or hernias noted. No hepatosplenomegaly. Msk:  Back normal, normal gait. Muscle strength and tone normal. Extremities:  No clubbing or cyanosis. Neurologic:  Alert and oriented x 3. Skin:  Intact without lesions or  rashes. Psych:  Normal affect.   Holter Monitor  Procedure date:  03/20/2010  Findings:      48 hour monitor documents sinus with pacs and rare pvcs but no afib  Impression & Recommendations:  Problem # 1:  ATRIAL FIBRILLATION (ICD-427.31) doing well recent holter reveals only pacs and pvcs to explain his palpitations I will add nadolol 20mg  daily today if his palpitations improve, I hope to reduce amiodarone upon return continue coumadin  Problem # 2:  DYSPNEA ON EXERTION (ICD-786.09) improved recent myoview was normal  Patient Instructions: 1)  Your physician wants you to follow-up in: 3 month follow up with Dr Johney Frame  Bonita Quin will receive a reminder letter in the mail two months in advance. If you don't receive a letter, please call our office to schedule the follow-up appointment. 2)  Your physician has recommended you make the following change in your medication: start Nadolol 20mg  one daily Prescriptions: NADOLOL 20 MG TABS (NADOLOL) one by mouth daily  #30 x 3   Entered by:   Dennis Bast, RN, BSN   Authorized by:   [name], MD   Signed by:   Dennis Bast, RN, BSN on 05/09/2010   Method used:   Electronically to        [facility]ail[facility]       [address]       [place], [place]  [address]       Ph: [phone]       Fax: [phone]   RxID:   [id number]

## 2010-05-14 NOTE — Miscellaneous (Signed)
  Clinical Lists Changes  Observations: Added new observation of NUCLEAR NOS:  Overall Impression   Exercise Capacity: Lexiscan with low level  exercise. BP Response: Normal blood pressure response. Clinical Symptoms: Moderate chest pain. ECG Impression: No significant ST segment change suggestive of ischemia. Overall Impression: Normal stress nuclear study. Overall Impression Comments: No perfusion evidence of ischemia.  Normal LV systolic function.   (03/20/2010 11:36)      Nuclear Study  Procedure date:  03/20/2010  Findings:       Overall Impression   Exercise Capacity: Lexiscan with low level  exercise. BP Response: Normal blood pressure response. Clinical Symptoms: Moderate chest pain. ECG Impression: No significant ST segment change suggestive of ischemia. Overall Impression: Normal stress nuclear study. Overall Impression Comments: No perfusion evidence of ischemia.  Normal LV systolic function.

## 2010-05-14 NOTE — Medication Information (Signed)
Summary: rov/sp/ rs per pt call=mj  Anticoagulant Therapy  Managed by: Cloyde Reams, RN, BSN Referring MD: Sharrell Ku PCP: Corwin Levins MD Supervising MD: Johney Frame MD, Fayrene Fearing Indication 1: Atrial Flutter (ICD-427.32) Indication 2: Atrial Fibrillation (ICD-427.31) Lab Used: [facility] Site: [facility] INR POC 2.4 INR RANGE 2 - 3  Dietary changes: no    Health status changes: no    Bleeding/hemorrhagic complications: no    Recent/future hospitalizations: no    Any changes in medication regimen? yes       Details: Added Nadolol 20mg  daily.  Recent/future dental: no  Any missed doses?: no       Is patient compliant with meds? yes       Allergies: 1)  ! * Ambien  Anticoagulation Management History:      The patient is taking warfarin and comes in today for a routine follow up visit.  Negative risk factors for bleeding include an age less than 53 years old.  The bleeding index is 'low risk'.  Positive CHADS2 values include History of CHF and History of HTN.  Negative CHADS2 values include Age > 108 years old.  The start date was 09/01/2002.  His last INR was 7.5 ratio.  Anticoagulation responsible provider: Milburn Freeney MD, Fayrene Fearing.  INR POC: 2.4.  Cuvette Lot#: [id number].  Exp: 04/2011.    Anticoagulation Management Assessment/Plan:      The patient's current anticoagulation dose is Warfarin sodium 2.5 mg  tabs: Take as directed by Anticoagulation Clinic.  The target INR is 2.0-3.0.  The next INR is due 06/06/2010.  Anticoagulation instructions were given to patient.  Results were reviewed/authorized by Cloyde Reams, RN, BSN.  He was notified by Cloyde Reams RN.         Prior Anticoagulation Instructions: INR 2.1    Continue to take 1 tablet everyday.  Recheck INR in 4 weeks.   Current Anticoagulation Instructions: INR 2.4  Continue on same dosage 1 tablet daily.  Recheck in 4 weeks.

## 2010-05-15 LAB — PROTIME-INR
INR: 2.86 — ABNORMAL HIGH (ref 0.00–1.49)
Prothrombin Time: 29.8 seconds — ABNORMAL HIGH (ref 11.6–15.2)

## 2010-05-30 ENCOUNTER — Other Ambulatory Visit: Payer: Self-pay

## 2010-05-30 MED ORDER — ALPRAZOLAM 0.25 MG PO TABS: 0.2500 mg | ORAL_TABLET | Freq: Two times a day (BID) | ORAL | Status: DC | PRN

## 2010-05-30 NOTE — Telephone Encounter (Signed)
Faxed hardcopy to pharmacy. 

## 2010-05-30 NOTE — Telephone Encounter (Signed)
Refill request on Alprazolam 0.5 mg last refill 01/22/10 #60 with 3 refills, last OV 05/09/10.

## 2010-06-03 ENCOUNTER — Other Ambulatory Visit: Payer: Self-pay

## 2010-06-03 ENCOUNTER — Other Ambulatory Visit: Payer: Self-pay | Admitting: Internal Medicine

## 2010-06-03 DIAGNOSIS — F419 Anxiety disorder, unspecified: Secondary | ICD-10-CM

## 2010-06-03 MED ORDER — ALPRAZOLAM 0.25 MG PO TABS: 0.2500 mg | ORAL_TABLET | Freq: Two times a day (BID) | ORAL | Status: DC | PRN

## 2010-06-03 NOTE — Telephone Encounter (Signed)
Faxed hardcopy to pharmacy. Also called the patient and he is Scheduled for CPX with Dr. Jonny Ruiz 07/09/10.

## 2010-06-03 NOTE — Telephone Encounter (Signed)
Done hardcopy to Scott [name]/LIM B   Please also remind pt , he is due for CPX with labs at any time

## 2010-06-03 NOTE — Telephone Encounter (Signed)
Pharmacy requesting refill on Xanax 0.5 mg. Last refill 01/22/10 #60 with 3 refills, last OV 02/12/10.

## 2010-06-06 ENCOUNTER — Ambulatory Visit (INDEPENDENT_AMBULATORY_CARE_PROVIDER_SITE_OTHER): Payer: Self-pay | Admitting: *Deleted

## 2010-06-06 DIAGNOSIS — I4892 Unspecified atrial flutter: Secondary | ICD-10-CM

## 2010-06-06 DIAGNOSIS — I4891 Unspecified atrial fibrillation: Secondary | ICD-10-CM

## 2010-06-06 DIAGNOSIS — Z7901 Long term (current) use of anticoagulants: Secondary | ICD-10-CM

## 2010-07-04 ENCOUNTER — Ambulatory Visit (INDEPENDENT_AMBULATORY_CARE_PROVIDER_SITE_OTHER): Payer: Self-pay | Admitting: *Deleted

## 2010-07-04 DIAGNOSIS — R0602 Shortness of breath: Secondary | ICD-10-CM

## 2010-07-04 DIAGNOSIS — I4892 Unspecified atrial flutter: Secondary | ICD-10-CM

## 2010-07-04 DIAGNOSIS — Z7901 Long term (current) use of anticoagulants: Secondary | ICD-10-CM

## 2010-07-04 DIAGNOSIS — I4891 Unspecified atrial fibrillation: Secondary | ICD-10-CM

## 2010-07-04 LAB — POCT INR: INR: 2.7

## 2010-07-05 ENCOUNTER — Other Ambulatory Visit (INDEPENDENT_AMBULATORY_CARE_PROVIDER_SITE_OTHER): Payer: PRIVATE HEALTH INSURANCE

## 2010-07-05 ENCOUNTER — Other Ambulatory Visit: Payer: Self-pay | Admitting: Internal Medicine

## 2010-07-05 DIAGNOSIS — Z1289 Encounter for screening for malignant neoplasm of other sites: Secondary | ICD-10-CM

## 2010-07-05 DIAGNOSIS — Z0000 Encounter for general adult medical examination without abnormal findings: Secondary | ICD-10-CM

## 2010-07-05 LAB — LIPID PANEL
HDL: 47.7 mg/dL (ref >39.00)
LDL Cholesterol: 107 mg/dL — ABNORMAL HIGH (ref 0–99)
Total CHOL/HDL Ratio: 3
Triglycerides: 49 mg/dL (ref 0.0–149.0)
VLDL: 9.8 mg/dL (ref 0.0–40.0)

## 2010-07-05 LAB — URINALYSIS
Bilirubin Urine: NEGATIVE
Hgb urine dipstick: NEGATIVE
Ketones, ur: NEGATIVE
Leukocytes, UA: NEGATIVE
Urobilinogen, UA: 0.2 (ref 0.0–1.0)
pH: 6.5 (ref 5.0–8.0)

## 2010-07-05 LAB — HEPATIC FUNCTION PANEL
Albumin: 3.4 g/dL — ABNORMAL LOW (ref 3.5–5.2)
Alkaline Phosphatase: 52 U/L (ref 39–117)
Total Protein: 6.6 g/dL (ref 6.0–8.3)

## 2010-07-05 LAB — BASIC METABOLIC PANEL
CO2: 31 mEq/L (ref 19–32)
Chloride: 105 mEq/L (ref 96–112)
Glucose, Bld: 98 mg/dL (ref 70–99)
Potassium: 3.8 mEq/L (ref 3.5–5.1)
Sodium: 142 mEq/L (ref 135–145)

## 2010-07-05 LAB — CBC WITH DIFFERENTIAL/PLATELET
Basophils Absolute: 0.1 10*3/uL (ref 0.0–0.1)
Hemoglobin: 13.9 g/dL (ref 13.0–17.0)
Lymphocytes Relative: 16.5 % (ref 12.0–46.0)
Monocytes Relative: 6.8 % (ref 3.0–12.0)
Neutro Abs: 5.2 10*3/uL (ref 1.4–7.7)
RBC: 4.78 Mil/uL (ref 4.22–5.81)
RDW: 15.3 % — ABNORMAL HIGH (ref 11.5–14.6)

## 2010-07-05 LAB — TSH: TSH: 3.78 u[IU]/mL (ref 0.35–5.50)

## 2010-07-07 ENCOUNTER — Encounter: Payer: Self-pay | Admitting: Internal Medicine

## 2010-07-07 DIAGNOSIS — Z0000 Encounter for general adult medical examination without abnormal findings: Secondary | ICD-10-CM | POA: Insufficient documentation

## 2010-07-07 DIAGNOSIS — Z0001 Encounter for general adult medical examination with abnormal findings: Secondary | ICD-10-CM | POA: Insufficient documentation

## 2010-07-08 ENCOUNTER — Encounter: Payer: Self-pay | Admitting: Internal Medicine

## 2010-07-09 ENCOUNTER — Ambulatory Visit (INDEPENDENT_AMBULATORY_CARE_PROVIDER_SITE_OTHER): Payer: PRIVATE HEALTH INSURANCE | Admitting: Internal Medicine

## 2010-07-09 ENCOUNTER — Encounter: Payer: Self-pay | Admitting: Internal Medicine

## 2010-07-09 VITALS — BP 112/68 | HR 46 | Temp 97.8°F | Ht 66.0 in | Wt 201.5 lb

## 2010-07-09 DIAGNOSIS — F419 Anxiety disorder, unspecified: Secondary | ICD-10-CM

## 2010-07-09 DIAGNOSIS — Z0000 Encounter for general adult medical examination without abnormal findings: Secondary | ICD-10-CM

## 2010-07-09 DIAGNOSIS — M1711 Unilateral primary osteoarthritis, right knee: Secondary | ICD-10-CM

## 2010-07-09 DIAGNOSIS — M1710 Unilateral primary osteoarthritis, unspecified knee: Secondary | ICD-10-CM

## 2010-07-09 DIAGNOSIS — F411 Generalized anxiety disorder: Secondary | ICD-10-CM

## 2010-07-09 DIAGNOSIS — M1712 Unilateral primary osteoarthritis, left knee: Secondary | ICD-10-CM | POA: Insufficient documentation

## 2010-07-09 HISTORY — DX: Unilateral primary osteoarthritis, right knee: M17.11

## 2010-07-09 MED ORDER — BUDESONIDE-FORMOTEROL FUMARATE 160-4.5 MCG/ACT IN AERO: 2.0000 | INHALATION_SPRAY | Freq: Two times a day (BID) | RESPIRATORY_TRACT | Status: DC

## 2010-07-09 MED ORDER — LOVASTATIN 40 MG PO TABS: 40.0000 mg | ORAL_TABLET | Freq: Two times a day (BID) | ORAL | Status: DC

## 2010-07-09 MED ORDER — ALPRAZOLAM 0.5 MG PO TABS: 0.5000 mg | ORAL_TABLET | Freq: Two times a day (BID) | ORAL | Status: DC | PRN

## 2010-07-09 NOTE — Assessment & Plan Note (Signed)
With recurring mod to severe pain, now with swelling; his ins will not pay for local ortho;  Will try refer to [facility] ortho

## 2010-07-09 NOTE — Assessment & Plan Note (Signed)
[facility]                             PULMONARY OFFICE NOTE   NAME:Scott [name], Scott [name]                        MRN:          [id number]  DATE:09/17/2006                            DOB:          Jul 09, 1955    HISTORY OF PRESENT ILLNESS:  The patient is a very pleasant 55 year old  gentleman who I have been asked to see for possible sleep apnea. The  patient has a very extensive cardiac history and there has been some  concern whether or not sleep disordered breathing may be contributing to  this. The patient states that he does have loud snoring, but no one has  ever mentioned pauses in his breathing during sleep. The patient  typically gets to bed between 11 and 1, and gets up at 7:30am to start  his day. He feels that he does not rest well and that he is tired in the  mornings upon arising. He states that he will awaken every two hours for  unknown reasons, but some of this is to go to the bathroom. The patient  does note sleep pressure during the day with periods of inactivity like  watching TV, but overall he is not dissatisfied with his alertness level  or quality of life. However, he does take a nap almost every day, at  least one hour. He has no difficulties driving short or long distances.  Of note, the patient's weight is down 45 pounds over the last few years.   PAST MEDICAL HISTORY:  1. Significant for hypertension.  2. History of tachy-arrhythmia induced cardiomyopathy.  3. History of chronic atrial fibrillation for which he is status post      ablation.  4. History of dyslipidemia.   CURRENT MEDICATIONS:  1. Amiodarone 200 mg b.i.d.  2. Lovastatin 40 mg daily.  3. Multivitamin daily.  4. Enalapril 5 mg b.i.d.  5. Coumadin as directed.  6. Citalopram 20 mg daily.  7. Temazepam 15 mg 1-2 nightly.  8. Alprazolam 0.5 mg 1-2 daily p.r.n. anxiety.   ALLERGIES:  No known drug allergies.   SOCIAL HISTORY:  He is single. He does not have  children. He has a  history of smoking one pack per day for 20 years. He has not smoked in  11 years.   FAMILY HISTORY:  Remarkable for his mother having heart disease,  otherwise non-contributory.   REVIEW OF SYSTEMS:  As per history of present illness, also see patient  intake form documented on the chart.   PHYSICAL EXAMINATION:  GENERAL:  He is an overweight male in no acute  distress.  VITAL SIGNS:  Blood pressure 150/88, pulse 59, temperature 97.5, weight  200 pounds, O2 saturation on room air is 97%. He is 5 foot 6 inches  tall.  HEENT:  Pupils equal, round, and reactive to light and accommodation,  extraocular muscles are intact. Naris show deviated septum to the left,  but patent. Oropharynx does show significant elongation of the soft  palette and uvula, with side wall narrowing.  NECK:  Supple without JVD or lymphadenopathy. There is no palpable  thyromegaly.  CHEST:  Totally clear.  CARDIAC:  Reveals what appears to be a regular rate and rhythm.  ABDOMEN:  Soft and nontender with good bowel sounds.  GENITAL:  Not done and not indicated.  RECTAL:  Not done and not indicated.  BREAST:  Not done and not indicated.  LOWER EXTREMITIES:  Without edema. Pulses are intact distally.  NEUROLOGIC:  Alert and oriented with no obvious motor deficits.   IMPRESSION:  Probable obstructive sleep apnea. The patient does give a  reasonable history for this, but is not overly symptomatic during the  day. I suspect that he has underestimating this because of his napping  and the fact that he does not do anything during the day that requires a  high level of attention in order to accomplish. I had a long discussion  with him about sleep apnea's impact on his cardiovascular health.  Because of his underlying cardiac disease and some of the risk factors  that I have described above, I really think that he should undergo  nocturnal polysomnography. The patient is agreeable to this  approach.   PLAN:  1. Schedule for NPSG.  2. Work on weight loss.  3. The patient will follow up after the above.     Barbaraann Share, MD,FCCP  Electronically Signed    [facility]  DD: 09/17/2006  DT: 09/18/2006  Job #: [id number]   cc:   [name]. Ladona Ridgel, MD  Corwin Levins, MD

## 2010-07-09 NOTE — Assessment & Plan Note (Signed)
[facility]                         ELECTROPHYSIOLOGY OFFICE NOTE   NAME:Scott [name], Scott [name]                        MRN:          [id number]  DATE:04/28/2008                            DOB:          February 26, 1955    Scott [name] returns today for followup.  He is a very pleasant middle-aged  male with a history of atrial fibrillation and initial tachycardia-  induced cardiomyopathy, who underwent AFib ablation back in November.  We have had him on decreasing doses of amiodarone.  Prior to his  ablation, he required between 300 and 400 mg a day of amiodarone to  maintain sinus rhythm.  Since his ablation, he has been on 200 a day and  he returns today for followup.  He feels well.  He denies chest pain or  shortness of breath.  He has had no symptomatic AFib.   CURRENT MEDICATIONS:  1. Amiodarone 200 mg daily.  2. Amlodipine 5 a day.  3. HCTZ 25 a day.  4. Enalapril 10 a day.  5. Coumadin as directed.  6. Lovastatin 40 a day.  7. He is also on alprazolam 0.5 twice daily.   PHYSICAL EXAMINATION:  GENERAL:  He is a pleasant middle-aged man in no  acute distress.  VITAL SIGNS:  Blood pressure today was 132/76, the pulse 60 and regular,  the respirations were 18, and the weight was 199 pounds.  NECK:  No jugular venous distention.  LUNGS:  Clear bilaterally to auscultation.  No wheezes, rales, or  rhonchi are present.  There is no increased work of breathing.  CARDIOVASCULAR:  Regular rate and rhythm.  Normal S1 and S2.  ABDOMEN:  Soft, nontender.  EXTREMITIES:  No edema.   IMPRESSION:  1. Atrial fibrillation, now well controlled on amiodarone, status post      atrial fibrillation ablation.  2. Hypertension, well controlled on medical therapy.  3. History of tachycardia-induced cardiomyopathy, now stable.   DISCUSSION:  Scott [name] is stable.  He is maintaining sinus rhythm very  nicely after his AFib ablation.  Plan will be to decrease his amiodarone  to  200 mg Monday through Friday and 100 mg Saturday and Sunday with  additional decreases as time goes on.  I will see him back in several  months.    [name]. Ladona Ridgel, MD  Electronically Signed   [facility]  DD: 04/28/2008  DT: 04/28/2008  Job #: [id number]

## 2010-07-09 NOTE — Progress Notes (Signed)
Subjective:    Patient ID: Scott [name], male    DOB: April 17, 1955, 55 y.o.   MRN: [id number]  HPI Here for wellness and f/u;  Overall doing ok;  Pt denies CP, worsening SOB, DOE, wheezing, orthopnea, PND, worsening LE edema, palpitations, dizziness or syncope.  Pt denies neurological change such as new Headache, facial or extremity weakness.  Pt denies polydipsia, polyuria, or low sugar symptoms. Pt states overall good compliance with treatment and medications, good tolerability, and trying to follow lower cholesterol diet.  Pt denies worsening depressive symptoms, suicidal ideation or panic. No fever, wt loss, night sweats, loss of appetite, or other constitutional symptoms.  Pt states good ability with ADL's, low fall risk, home safety reviewed and adequate, no significant changes in hearing or vision, and occasionally active with exercise.  No overt bleeding and only rare bruise.  Does have ongoing mod to severe right knee pain and recurrent swelling , ok on pain meds for now, but overall worse recently with his new job standing as money taker at [employer]  Past Medical History  Diagnosis Date  . HYPOTHYROIDISM, ACQUIRED NEC 09/28/2006  . HYPERLIPIDEMIA 09/28/2006  . ANXIETY 09/28/2006  . OBSTRUCTIVE SLEEP APNEA 01/06/2008  . HYPERTENSION 09/28/2006  . Atrial fibrillation 09/28/2006  . Atrial flutter 09/28/2006  . CONGESTIVE HEART FAILURE 09/28/2006  . ALLERGIC RHINITIS 01/14/2007  . CHRONIC OBSTRUCTIVE PULMONARY DISEASE, ACUTE EXACERBATION 10/04/2008  . KNEE PAIN, RIGHT 10/27/2008  . DYSPNEA ON EXERTION 03/02/2009  . CHEST PAIN 03/02/2009  . CHEST PAIN, ATYPICAL 09/14/2008  . Long term current use of anticoagulant 04/08/2010  . COPD 10/04/2008   Past Surgical History  Procedure Date  . Hand surgury     left  . Radio frequency ablation for atrial flutter 2005    CTI  . Radio frequency ablation for atrial fibrillation 12/30/07 and 06/05/09    reports that he has quit smoking. He does not have any  smokeless tobacco history on file. He reports that he does not drink alcohol or use illicit drugs. family history includes Dementia in his father and Hypertension in his father. Allergies  Allergen Reactions  . Zolpidem Tartrate     REACTION: bad hangover   Current Outpatient Prescriptions on File Prior to Visit  Medication Sig Dispense Refill  . albuterol (PROAIR HFA) 108 (90 BASE) MCG/ACT inhaler Inhale 2 puffs into the lungs 4 (four) times daily.        Marland Kitchen ALPRAZolam (XANAX) 0.25 MG tablet Take 1 tablet (0.25 mg total) by mouth 2 (two) times daily as needed for sleep.  60 tablet  3  . amiodarone (PACERONE) 200 MG tablet Take 200 mg by mouth daily.        Marland Kitchen amLODipine (NORVASC) 5 MG tablet Take 5 mg by mouth daily.        . budesonide-formoterol (SYMBICORT) 160-4.5 MCG/ACT inhaler Inhale 2 puffs into the lungs 2 (two) times daily.        . citalopram (CELEXA) 20 MG tablet Take 20 mg by mouth daily.        . enalapril (VASOTEC) 10 MG tablet Take 10 mg by mouth daily.        . hydrochlorothiazide 25 MG tablet Take 25 mg by mouth daily.        Marland Kitchen HYDROcodone-acetaminophen (NORCO) 5-325 MG per tablet Take 1 tablet by mouth 2 (two) times daily as needed.        . lovastatin (MEVACOR) 40 MG tablet Take 40 mg  by mouth 2 (two) times daily.        . nadolol (CORGARD) 20 MG tablet Take 20 mg by mouth daily.        . temazepam (RESTORIL) 30 MG capsule Take 30 mg by mouth at bedtime as needed.        . warfarin (COUMADIN) 2.5 MG tablet FOLLOW DIRECTIONS PROVIDED BY PHYSICIAN  90 tablet  3   Review of Systems Review of Systems  Constitutional: Negative for diaphoresis, activity change, appetite change and unexpected weight change.  HENT: Negative for hearing loss, ear pain, facial swelling, mouth sores and neck stiffness.   Eyes: Negative for pain, redness and visual disturbance.  Respiratory: Negative for shortness of breath and wheezing.   Cardiovascular: Negative for chest pain and palpitations.    Gastrointestinal: Negative for diarrhea, blood in stool, abdominal distention and rectal pain.  Genitourinary: Negative for hematuria, flank pain and decreased urine volume.  Musculoskeletal: Negative for myalgias and joint swelling.  Skin: Negative for color change and wound.  Neurological: Negative for syncope and numbness.  Hematological: Negative for adenopathy.  Psychiatric/Behavioral: Negative for hallucinations, self-injury, decreased concentration and agitation.      Objective:   Physical Exam BP 112/68  Pulse 46  Temp(Src) 97.8 F (36.6 C) (Oral)  Ht 5\' 6"  (1.676 m)  Wt 201 lb 8 oz (91.4 kg)  BMI 32.52 kg/m2  SpO2 95% Physical Exam  VS noted Constitutional: Pt is oriented to person, place, and time. Appears well-developed and well-nourished.  HENT:  Head: Normocephalic and atraumatic.  Right Ear: External ear normal.  Left Ear: External ear normal.  Nose: Nose normal.  Mouth/Throat: Oropharynx is clear and moist.  Eyes: Conjunctivae and EOM are normal. Pupils are equal, round, and reactive to light.  Neck: Normal range of motion. Neck supple. No JVD present. No tracheal deviation present.  Cardiovascular: Normal rate, regular rhythm, normal heart sounds and intact distal pulses.   Pulmonary/Chest: Effort normal and breath sounds normal.  Abdominal: Soft. Bowel sounds are normal. There is no tenderness.  Musculoskeletal: Normal range of motion. Exhibits no edema. Right knee with marked crepitus, decreased rom, mild effusion  Lymphadenopathy:  Has no cervical adenopathy.  Neurological: Pt is alert and oriented to person, place, and time. Pt has normal reflexes. No cranial nerve deficit.  Skin: Skin is warm and dry. No rash noted. smallbruise to left forearm noted Psychiatric:  Has  normal mood and affect. Behavior is normal.         Assessment & Plan:

## 2010-07-09 NOTE — Assessment & Plan Note (Signed)
Overall doing well, age appropriate education and counseling updated, referrals for preventative services and immunizations addressed, dietary and smoking counseling addressed, most recent labs and ECG reviewed.  I have personally reviewed and have noted: 1) the patient's medical and social history 2) The pt's use of alcohol, tobacco, and illicit drugs 3) The patient's current medications and supplements 4) Functional ability including ADL's, fall risk, home safety risk, hearing and visual impairment 5) Diet and physical activities 6) Evidence for depression or mood disorder 7) The patient's height, weight, and BMI have been recorded in the chart I have made referrals, and provided counseling and education based on review of the above Declines colonoscopy as ins does not cover

## 2010-07-09 NOTE — Letter (Signed)
March 02, 2008    [name]. Ladona Ridgel, MD  [address]  [address]  [place], [place] [address]   RE:  Scott [name], Scott [name]  MRN:  [id number]  /  DOB:  1955-10-14   Dear Sharlot Gowda,   It was my pleasure to see your patient Scott [name] in  electrophysiology followup today following his recent atrial  fibrillation ablation.  As you recall, [name] is a very pleasant 55-  year-old gentleman with symptomatic persistent atrial fibrillation  requiring high doses of amiodarone in the past.  He previously described  symptoms of atrial fibrillation 2-3 times per week despite amiodarone  400 mg alternating with 300 mg daily.  He therefore underwent EP study  and radiofrequency ablation for atrial fibrillation on December 30, 2007,  at [facility].  He was found to have moderate size pulmonary  veins as well as a common ostium to the left superior and left inferior  pulmonary veins.  All four pulmonary veins were successfully  electrically isolated and anatomically encircled using an antral  isolation technique.  During catheter manipulation within the right  inferior pulmonary vein, the patient converted to atrial fibrillation  and was noted to have prodigious conduction from the right inferior  pulmonary vein.  This vein was therefore felt to represent a culprit  vein for the patient's atrial fibrillation and was successfully isolated  along with the other veins.  Additional radiofrequency applications were  delivered around the interim of the right inferior pulmonary vein as  well as along the carina between the right superior and right inferior  pulmonary veins.  Complex fractionated atrial electrograms were also  identified and successfully ablated along the roof of the left atrium,  the interatrial septum, between the left superior pulmonary vein and the  atrial appendage, and along the lateral wall of the left atrium from the  left inferior pulmonary vein to the mitral valve annulus.   Complex  fractionated atrial electrograms were also ablated along the coronary  sinus.  The patient reports doing very well since his ablation.  He has  had no further symptomatic episodes of atrial fibrillation.  He is quite  pleased with the results of the procedure.  He specifically denies chest  pain, dysphagia, shortness of breath, orthopnea, PND, palpitations,  neurologic sequelae, presyncope, syncope, or other concerns.  He reports  that his groins healed quite nicely following the procedure.  He now  requests that his amiodarone to be discontinued as we are able.   PROBLEM LIST:  1. Persistent atrial fibrillation (as above).  2. Hypertension.  3. Obstructive sleep apnea, compliant with CPAP.  4. Anxiety with difficulty sleeping.  5. Status post cava-tricuspid isthmus ablation in 2005.   CURRENT MEDICATIONS:  1. Xanax 0.5 mg b.i.d.  2. Citalopram 20 mg daily.  3. Temazepam 15 mg 1-2 tablets nightly.  4. Amiodarone 300 mg alternating with 400 mg daily.  5. Lovastatin 40 mg daily.  6. Coumadin to maintain an INR between 2 and 3.  7. Enalapril 10 mg daily.  8. Hydrochlorothiazide 25 mg daily.   PHYSICAL EXAMINATION:  VITAL SIGNS:  Blood pressure 144/92, heart rate  53, respirations 18, and weight 195 pounds.  GENERAL:  The patient is a well-appearing male in no acute distress.  He  is alert and oriented x3.  HEENT:  Normocephalic and atraumatic.  Sclerae clear.  Conjunctivae  pink.  Oropharynx clear.  NECK:  Supple.  No lymphadenopathy, thyromegaly, or bruits.  LUNGS:  Clear to auscultation bilaterally.  HEART:  Regular rate and rhythm.  No murmurs, rubs, or gallops.  GI:  Soft, nontender, and nondistended.  Positive bowel sounds.  EXTREMITIES:  No clubbing, cyanosis, or edema.  NEUROLOGIC:  Nonfocal.   EKG reveals sinus bradycardia at 53 beats per minute, a first-degree AV  block with a PR duration of 210 milliseconds is noted, otherwise  unremarkable EKG.    IMPRESSION:  [name] is a pleasant 55 year old gentleman with  symptomatic persistent atrial fibrillation, who presents today for  followup following his recent atrial fibrillation ablation.  He appears  to be doing quite well with no further symptomatic episodes of atrial  fibrillation.   PLAN:  At the present time, I think that our strategy should be to wean  his amiodarone.  I have therefore taken the liberty to decrease his  amiodarone to 200 mg daily today.  I have asked [name] to follow up  with you in clinic in 2 months.  At that time, if he has no further  symptomatic atrial fibrillation, then I think his amiodarone should be  discontinued.  His CHADS2 score is 1 presently.  I think that therefore  in the long run if he is able to maintain sinus rhythm off of all  antiarrhythmic medications, then we could consider cessation of Coumadin  down the road.   Thank you for the opportunity of participating in the care of [name].  I have returned the entirety of his electrophysiologic care back to you.  Please feel free to contact me, if I can be of further assistance.    Sincerely,      [name], MD  Electronically Signed    JA/MedQ  DD: 03/02/2008  DT: 03/03/2008  Job #: [id number]   CC:    Noralyn Pick. Eden Emms, MD, [facility]

## 2010-07-09 NOTE — Assessment & Plan Note (Signed)
[facility]                         ELECTROPHYSIOLOGY OFFICE NOTE   NAME:Scott [name], Scott [name]                        MRN:          [id number]  DATE:12/30/2006                            DOB:          1955/10/16    Scott [name] returns today for followup. Scott [name] is a very pleasant, 55 year old  man with a history of nonischemic cardiomyopathy thought secondary to A  fib now with restoration of sinus rhythm, history of paroxysmal atrial  fibrillation, history of obesity and sleep apnea who returns today for  followup. Scott [name] returns. Scott [name] denies chest pain or shortness of  breath. Scott [name] continues to exercise on a daily basis. His weight is down to  200 pounds from a high of 230 several years ago. Scott [name] has had no  palpitations and overall had no specific complaints today.   MEDICATIONS:  1. Amiodarone 400 mg daily.  2. HCTZ 12.5 daily.  3. Lovastatin 40 a day.  4. Coumadin as directed.  5. Enalapril 10 mg twice daily.  6. Temazepam for sleep.   PHYSICAL EXAMINATION:  GENERAL:  Scott [name] is a pleasant, middle-age man in no  distress.  VITAL SIGNS:  Blood pressure 160/90, the pulse was 52 and regular,  respirations were 18. Weight was 199 pounds.  NECK:  Revealed no jugular venous distention.  LUNGS:  Clear bilaterally to auscultation. No wheezes, rales or rhonchi  were present. There was no increased work of breathing.  CARDIOVASCULAR:  Regular rate and rhythm with normal S1 and S2.  ABDOMEN:  Soft and nontender. There was no organomegaly.  EXTREMITIES:  Demonstrated no cyanosis, clubbing or edema. Pulse were 2+  and symmetric.   EKG demonstrated sinus bradycardia with first degree AV block. The QT  interval was approximately 450 msec.   IMPRESSION:  1. Paroxysmal/persistent atrial fibrillation now well controlled on      relatively high-dose amiodarone.  2. History of tachycardia-induced cardiomyopathy now resolved.  3. Hypertension.  4. History of obesity.   DISCUSSION:  Overall Scott [name] is stable. Labs obtained several weeks  ago demonstrate no evidence of elevated white count, no liver function  abnormalities and a normal TSH. Because Scott [name] becomes so sick when Scott [name] is in  A fib, I have recommended that Scott [name] continue on 400 a day of  amiodarone. I will see him back in 6 months. Depending on how Scott [name] is  doing, we might consider  decreasing down to 300 mg a day of amiodarone. It has become that 200 a  day is not enough to keep him in sinus rhythm.     [name]. Ladona Ridgel, MD  Electronically Signed    GWT/MedQ  DD: 12/30/2006  DT: 12/31/2006  Job #: [id number]

## 2010-07-09 NOTE — Procedures (Signed)
NAME:  [name], Scott               ACCOUNT NO.:  [id number]   MEDICAL RECORD NO.:  [id number]          PATIENT TYPE:  OUT   LOCATION:  SLEEP CENTER                 FACILITY[facility]  [facility]   PHYSICIAN:  Clinton D. Maple Hudson, MD, FCCP, FACPDATE OF BIRTH:  08-Apr-1955   DATE OF STUDY:  12/06/2007                            NOCTURNAL POLYSOMNOGRAM   REFERRING PHYSICIAN:  [name], MD   NOCTURNAL POLYSOMNOGRAPHY REPORT   REFERRING PHYSICIAN:  [name], MD   INDICATION FOR STUDY:  Hypersomnia with sleep apnea.   EPWORTH SLEEPINESS SCORE:  Epworth sleepiness score 6/24, BMI 31, weight  190 pounds, height 66 inches, and neck 17 inches.   MEDICATIONS:  Home medications are charted and reviewed.   SLEEP ARCHITECTURE:  Split study protocol.  During the diagnostic phase,  total sleep time was 121 minutes with sleep efficiency 55%.  Stage I  8.6%, stage II 91.4%, stage III and REM were absent.  Sleep latency 95  minutes.  Awake after sleep onset 3 minutes.  Arousal index 33.1.  Temazepam was taken at bedtime.   RESPIRATORY DATA:  Split study protocol.  Apnea-hypopnea index (AHI)  16.8 per hour.  A total of 34 events were scored including 24  obstructive apneas and 10 hypopneas before CPAP.  All events were  recorded as supine.  CPAP was titrated to 11 CWP, AHI 0 per hour.  He  chose a medium ResMed Mirage Quattro mask with heated humidifier.   OXYGEN DATA:  Moderate snoring with oxygen desaturation before CPAP to a  nadir of 87% on room air.  After CPAP control, mean oxygen saturation  held 95.8% on room air.   CARDIAC DATA:  Sinus rhythm.   MOVEMENT-PARASOMNIA:  Periodic limb movement with arousal.  295 events  were counted, of which 17 were associated with arousal for an arousal  index of 8.4 per hour.  No bathroom trips.   IMPRESSIONS-RECOMMENDATIONS:  1. Moderate obstructive sleep apnea/hypopnea syndrome, AHI 16.8 per      hour with all events recorded as supine.  Moderate snoring  with      oxygen desaturation to a nadir of 87%.  2. Successful CPAP titration to 11 CWP, AHI 0 per hour.  He chose a      medium ResMed Mirage Quattro mask with heated humidifier.  3. Periodic limb movement with arousal, 8.4 per hour.  This may settle      down after he has time to adjust to CPAP.      If it appears to remain a significant cause to sleep disturbance,      then specific therapy such as ReQuip or Mirapex might be considered      if appropriate.      Clinton D. Maple Hudson, MD, [facility], FACP  Diplomate, Biomedical engineer of Sleep Medicine  Electronically Signed     CDY/MEDQ  D:  12/11/2007 15:43:19  T:  12/11/2007 23:58:16  Job:  [id number]

## 2010-07-09 NOTE — Assessment & Plan Note (Signed)
[facility]                         ELECTROPHYSIOLOGY OFFICE NOTE   NAME:Scott [name]                        MRN:          [id number]  DATE:08/31/2006                            DOB:          January 15, 1956    Scott [name] returns today for followup.  He is a very pleasant middle-aged  man with a history of severe atrial fibrillation, which is persistent,  as well as a nonischemic/tachycardia-induced cardiomyopathy and a  history of hypertension and obesity who returns today for followup.  Overall, [name] is stable.  We had to cardiovert him several months  ago and leave him on high dose amiodarone after having recurrence of his  afib.  In the past, the patient was severely symptomatic with his afib.  He has no specific complaints today.  He denies heart failure symptoms,  chest pain or shortness of breath.  The patient does, on careful  questioning, admit to being told that he is a loud snorer at night time.  This is despite a 40 pound weight loss which has occurred over the last  several years secondary to increased activity and exercise.   MEDICATIONS:  1. HCTZ 12.5 mg a day.  2. Amiodarone 200 twice a day.  3. Lovastatin 40 a day.  4. Enalapril 5 b.i.d.  5. Coumadin.   PHYSICAL EXAM:  He is a pleasant, well-appearing, middle-aged man in no  acute distress.  The blood pressure was 142/100, the pulse was 60 and  regular, the respirations were 18, the weight was 199 pounds.  NECK:  No jugular venous distention.  LUNGS:  Clear bilaterally to auscultation.  No rhonchi present.  CARDIOVASCULAR EXAM:  A regular rate and rhythm with a normal S1 and S2.  EXTREMITIES:  No cyanosis, clubbing or edema.  Pulses are 2+ and  symmetric.   The EKG demonstrates sinus bradycardia with first degree AV block.   IMPRESSION:  1. Persistent atrial fibrillation, now maintaining sinus rhythm on      amiodarone.  2. Hypertension.  This has been present despite his  weight loss.  The      patient is still trying to lose weight.  I have asked that he      follow his blood pressure carefully and if his blood pressure      remains elevated to call our office and we would increase his HCTZ      but also require starting him on potassium 20 mEq daily and a      followup BNP would be required.  3. Congestive heart failure.  At the present Scott [name]'s heart failure      is stable. He is not on beta-blocker secondary to bradycardia from      his amiodarone and underlying sinus node dysfunction.  He will      continue on enalapril 5 mg twice daily.  4. Dyslipidemia.  He will continue Lovastatin.  5. Snoring at night time.  The patient may well have sleep apnea and      this could be fueling his      atrial fibrillation.  I have asked that he follow up with Dr. Marcelyn [name], who is one of our sleep doctors, to see about whether he      might benefit by having a sleep study.     Scott [name]. Scott Ridgel, MD  Electronically Signed    GWT/MedQ  DD: 08/31/2006  DT: 08/31/2006  Job #: [id number]

## 2010-07-09 NOTE — Discharge Summary (Signed)
NAME:  Scott [name], Scott [name]               ACCOUNT NO.:  [id number]   MEDICAL RECORD NO.:  [id number]          PATIENT TYPE:  INP   LOCATION:  2926                         FACILITY:  [facility]   PHYSICIAN:  [name], MD       DATE OF BIRTH:  April 02, 1955   DATE OF ADMISSION:  12/30/2007  DATE OF DISCHARGE:  12/31/2007                               DISCHARGE SUMMARY   This patient has no known drug allergies and time for this dictation in  discharge greater than 35 minutes.   FINAL DIAGNOSES:  1. History of paroxysmal atrial fibrillation with progressive episodes      of atrial fibrillation.  2. Discharge in day one status post electrophysiology study with      radiofrequency catheter ablation of his atrial fibrillation using a      pulmonary vein isolation procedure.  The patient required direct      current cardioversion to sinus rhythm 200 joules.  3. First-degree atrioventricular block.   SECONDARY DIAGNOSES:  1. Hypertension.  2. Isthmus-dependent atrial flutter.      a.     Status post radiofrequency catheter ablation of a       cavotricuspid isthmus-dependent counterclockwise atrial flutter,       July 2005.  3. Paroxysmal atrial fibrillation, developed post flutter ablation.  4. Breakthrough of atrial fibrillation on amiodarone therapy which has      been escalating in dosage.  Symptoms are palpitation, dyspnea, and      fatigue.  5. Transesophageal echocardiogram, September 2009.  Ejection fraction      65-75%.  6. Obstructive sleep apnea, awaiting CPAP fitting and Somnus restless      sleep procedure December 30, 2007, electrophysiology study with      pulmonary vein isolation technique, Dr. Johney Frame.   BRIEF HISTORY:  Scott [name] is a 55 year old male.  He has a history of  paroxysmal atrial fibrillation.  His symptoms are becoming more  progressive, they are lasting longer.  His symptoms are palpitations,  dyspnea, and fatigue.  He has been controlled in the past on metoprolol.  He has also been on amiodarone and has required higher doses lately.  The patient has required direct current cardioversion on the past.   Over the past month, he has had increasing episodes, they are self  terminated and he has not required cardioversion.  They occur about 2-3  times a week.  He presents for evaluation and referral from Dr. Charlton Haws.  The patient underwent transthoracic echocardiogram, November 15, 2007, ejection fraction 65-75%.   Because the patient has failed amiodarone therapy and is fairly  significantly symptomatic when an atrial fibrillation, consideration of  atrial fibrillation ablation is considered.  The patient is in and out  of sinus rhythm and so transesophageal echocardiogram prior to the  procedure will be not required.  He will present electively for  pulmonary vein isolation technique   HOSPITAL COURSE:  The patient presents electively December 30, 2007.  He  underwent electrophysiology study and the pulmonary vein isolation  procedure by Dr. Johney Frame.  The patient  has had no postprocedural  complications including no evidence of chest pain or tamponade.  No  recurrence of atrial fibrillation.  The right groin has been inspected  and there is no hematoma.  The patient discharge is postprocedure day  #1.  He goes home on the following medications.  1. Alprazolam 0.5 mg twice daily.  2. Amiodarone 200 mg twice daily.  3. Enalapril 10 mg twice daily.  4. Citalopram 20 mg daily.  5. Temazepam 15 mg 1-2 tablets at bedtime daily.  6. Hydrochlorothiazide 25 mg daily.  7. Lovastatin 40 mg daily.  8. Coumadin 2.5 mg daily, but he is to take 1.25 mg on the evening of      December 31, 2007.  9. Amlodipine 5 mg daily.   Laboratory studies this admission were drawn on December 27, 2007.  White  cells 6.4, hemoglobin 14.2, hematocrit 41.3, platelets are 154.  Protime  29, INR 2.7, sodium 141, potassium 3.8, chloride 103, carbonate 32,  glucose 98, BUN is  18, creatinine 1.1.  The patient has been given a  prescription for enalapril and he is once again asked to take 1.25 mg of  Coumadin on the evening of December 31, 2007.  He has followup at [facility] Monday January 03, 2008 at 2:45 p.m. and he will see Dr.  Johney Frame March 03, 2007 at 11:30.  He has an appoint with Dr. Shelle Iron his  pulmonologist, Thursday, January 06, 2008 at 11:30 a.m.      Maple Mirza, [place]      [name], MD  Electronically Signed    GM/MEDQ  D:  12/31/2007  T:  12/31/2007  Job:  [id number]

## 2010-07-09 NOTE — Op Note (Signed)
NAME:  Scott [name], Scott [name]               ACCOUNT NO.:  [id number]   MEDICAL RECORD NO.:  [id number]          PATIENT TYPE:  AMB   LOCATION:  ENDO                         FACILITY:  [facility]   PHYSICIAN:  [name], MD       DATE OF BIRTH:  05-11-1955   DATE OF PROCEDURE:  DATE OF DISCHARGE:                               OPERATIVE REPORT   SURGEON:  [name], MD   FIRST ASSIST:  [name]. Ladona Ridgel, MD   PREPROCEDURE DIAGNOSIS:  Paroxysmal atrial fibrillation.   POSTPROCEDURE DIAGNOSES:  Paroxysmal atrial fibrillation.   PROCEDURES:  1. Diagnostic electrophysiology study.  2. Coronary sinus pacing and recording.  3. Three-dimensional mapping of supraventricular tachycardia.  4. Radiofrequency ablation of supraventricular tachycardia.  5. Intracardiac echocardiography.  6. Pulmonary vein venography.  7. Arterial blood pressure monitoring.  8. Coronary sinus pacing and recording.  9. Isoproterenol infusion.  10.Cardioversion.   INTRODUCTION:  [name] is a pleasant 55 year old gentleman with a  history of paroxysmal atrial fibrillation and recently diagnosed  obstructive sleep apnea who presents for EP study and radiofrequency  ablation of atrial fibrillation.  He has a history of longstanding  atrial fibrillation and has required high doses of amiodarone to  maintain sinus rhythm.  Despite significant doses of amiodarone, he  continues to have intermittent episodes of symptomatic atrial  fibrillation.  He therefore presents today for EP study and  radiofrequency ablation.   DESCRIPTION OF PROCEDURE:  Informed written consent was obtained and the  patient was brought to the electrophysiology lab in a fasting state.  He  was adequately sedated with intravenous medications by Anesthesia.  The  patient's right and left groins were prepped and draped in the usual  sterile fashion by the EP lab staff.  Using a percutaneous Seldinger  technique, one 6-French, one 7-French, and one  8-French hemostasis  sheaths were placed in the right common femoral vein.  A 4-French  hemostasis sheath was placed in the right common femoral artery for  blood pressure monitoring.  An 11-French hemostasis sheath was placed in  the left common femoral vein.  A 6-French decapolar Polaris X catheter  was introduced through the right common femoral vein and advanced into  the coronary sinus for recording and pacing from this location.  A 6-  French quadripolar Josephson catheter was introduced through the right  common femoral vein and advanced into the His bundle location.  The  patient presented to the electrophysiology lab in normal sinus rhythm.  His AH interval measured 138 milliseconds with an HV interval of 44  milliseconds.  A Biosense Webster AcuNav intracardiac echocardiography  catheter was introduced through the left common femoral vein and  advanced into the right atrium.  Intracardiac echocardiography of the  left atrium was performed, which revealed a moderate-sized left atrium  with a common ostium to the left superior and left inferior pulmonary  veins.  A three-dimensional reconstruction of the left atrium was  performed using CartoSound technology.  The middle right common femoral  vein sheath was then exchanged for an 8.5-French SL2 transseptal sheath  and transseptal access was achieved in a standard fashion using a  Brockenbrough needle under biplane fluoroscopy.  Initial cardiac  echocardiography was used to confirm catheter placement.  An aneurysmal  interatrial septum was observed with intracardiac echocardiography.  Once transseptal access had been achieved, heparin was administered  intravenously and intra-arterially in order to maintain an ACT of  greater than 400 seconds throughout the procedure.  A 6-French  multipurpose angiographic catheter with guidewire was introduced through  the transseptal sheath and positioned over the mouth of all four  pulmonary  veins.  Pulmonary venograms were performed by hand injection  of nonionic contrast when she demonstrated moderate size pulmonary veins  with a common ostium to the left superior and left inferior pulmonary  veins with no evidence of pulmonary vein stenosis.  The angiographic  catheter was then removed.  The His bundle catheter was removed and  placed a 3.5-mm Biosense Webster E[employer] ablation catheter  was advanced into the right atrium.  The transseptal sheath was pulled  back into the IVC over a guidewire.  The ablation catheter was advanced  across the transseptal hole using the wires guide.  The transseptal  sheath was then re-advanced over the guidewire into the left atrium.  A  20 pole 20 mm circular mapping catheter was introduced through the  transseptal sheath and positioned over the mouth of all four pulmonary  veins.  This demonstrated electrical activity within all four pulmonary  veins at baseline.  All four pulmonary veins were successfully  sequentially electrically isolated and anatomically encircled using an  antral isolation technique.  Intracardiac echocardiography was used to  confirm the position of the Lasso catheter at the antrum of each vein  prior to ablation.  During catheter manipulation within the right  inferior pulmonary vein, the patient converted to atrial fibrillation.  Prodigious electrical conduction was noted within the right inferior  pulmonary vein and this was felt to be a culprit vein for atrial  fibrillation.  Additional radiofrequency applications were delivered  around the antrum of the right inferior pulmonary vein as well as along  the carina between the right superior and right inferior pulmonary  veins.  Additionally, complex fractionated atrial electrograms were  identified and successfully ablated along the roof of the left atrium,  the intra-atrial septum, between the left superior pulmonary vein and  the atrial appendage, and  along the lateral wall of the left atrium from  the left inferior pulmonary vein to the mitral valve annulus.  Additional complex fractionated atrial electrograms were identified and  successfully ablated above the coronary sinus.  The patient was then  successfully cardioverted to sinus rhythm with a single synchronized 200-  joule biphasic shock with cardioversion electrodes in the anterior  posterior thoracic configuration.  Isoproterenol was then infused up to  5 mcg per minute with an adequate heart rate response.  No inducible  atrial or ventricular arrhythmias were observed with isoproterenol  infusion.  Isoproterenol was allowed to wash out with no PACs, atrial  flutter, or atrial fibrillation identified.  Pacing was performed from  the left atrium with no inducible arrhythmias.  Following ablation, the  ablation catheter was placed into the right ventricle and pacing was  performed, which revealed VA conduction block when pacing at 550  milliseconds.  The procedure was therefore considered completed.  All  catheters were removed and the sheaths were aspirated and flushed.  A  limited bedside transthoracic echocardiogram revealed no pericardial  effusion.  There were no early apparent complications.   CONCLUSIONS:  1. Normal sinus rhythm upon presentation.  2. Successful electrical isolation and anatomical encircling of all      four pulmonary veins using radiofrequency current.  3. Atrial fibrillation was induced with catheter manipulation within      the right inferior pulmonary vein.  4. Ablation of complex fractionated atrial electrograms around the      antrum of the right inferior pulmonary vein, along the roof of the      left atrium, between the left superior pulmonary vein and the      atrial appendage, along the lateral wall of the left atrium, and      above the coronary sinus.  5. Successful cardioversion to sinus rhythm following ablation.  6. No inducible  arrhythmias with isoproterenol infused up to 5 mcg per      minute following ablation.  7. No early apparent complications.      [name], MD  Electronically Signed     JA/MEDQ  D:  12/30/2007  T:  12/31/2007  Job:  [id number]   cc:   Carolanne Grumbling, M.D.  Noralyn Pick. Eden Emms, MD, [facility]

## 2010-07-09 NOTE — Patient Instructions (Signed)
Take all new medications as prescribed - the higher strength alprazolam Continue all other medications as before You will be contacted regarding the referral for: [facility] orthopedic Please return in 1 year for your yearly visit, or sooner if needed, with Lab testing done 3-5 days before

## 2010-07-09 NOTE — Assessment & Plan Note (Signed)
[facility]                         ELECTROPHYSIOLOGY OFFICE NOTE   NAME:Scott [name], Scott [name]                        MRN:          [id number]  DATE:07/29/2007                            DOB:          03-03-1955    [name] returns today for follow-up.  He is a pleasant middle-aged  male with persistent/paroxysmal atrial fibrillation who has been  maintaining sinus rhythm on amiodarone.  He has a history of atrial  flutter status post ablation.  He has hypertension.  He has returned  today for follow-up.  He denies chest pain, denies shortness of breath.  The patient recently lost his mother, who died of a stroke and  complications from her atrial fibrillation.  He has not been exercising  because his father, who has fairly advanced dementia, has required all  of his time and is not yet in an assisted-living facility.  The patient  returns for additional evaluation.  He denies chest pain.  He denies  shortness of breath and denies palpitations.   MEDICATIONS:  1. Hydrochlorothiazide 25 mg a half-tablet daily.  2. Amiodarone 200 mg twice daily.  3. Lovastatin 40 mg a day.  4. Coumadin as directed.  5. Enalapril 10 mg twice a day.   EXAM:  He is a pleasant, well-appearing middle-aged man in no acute  distress.  The blood pressure today was 140/90, pulse was 57 and  regular, respirations were 18.  The weight was 186 pounds.  NECK:  No jugular venous distention.  LUNGS:  Clear bilaterally to auscultation.  No wheezes, rales or rhonchi  are present.  There is no increased work of breathing.  CARDIAC:  Regular rate and rhythm.  Normal S1 and S2.  No murmurs, rubs  or gallops were present.  EXTREMITIES:  No peripheral edema..   EKG demonstrates sinus rhythm with normal axis and intervals.   IMPRESSION:  1. Paroxysmal/persistent atrial fibrillation.  2. Hypertension.  3. Atrial flutter, status post ablation.  4. Chronic amiodarone therapy (higher  dose).   DISCUSSION:  Overall [name] is stable.  I have asked that he decrease  his amiodarone from for 400 mg to 400 mg Monday through Thursday and 300  mg Friday, Saturday and Sunday.  I  will see him back in several months.  He would be a candidate for atrial  fibrillation ablation because of the requirement for high-dose  amiodarone with his relative young age.     [name]. Ladona Ridgel, MD  Electronically Signed    GWT/MedQ  DD: 07/29/2007  DT: 07/29/2007  Job #: [id number]

## 2010-07-09 NOTE — Assessment & Plan Note (Signed)
 [facility]ALTHC[facility]                         ELECTROPHYSIOLOGY OFFICE NOTE   NAME:Scott [name], Scott [name]                        MRN:          [id number]  DATE:06/26/2006                            DOB:          March 19, 1955    Scott [name] returns today for followup.  He is a very pleasant, middle-  aged male with a history of persistent atrial fibrillation, chronic  Amiodarone therapy, and chronic Coumadin therapy.  He has a history of  tachycardia-induced cardiomyopathy.  The patient has also had difficulty  with hypertension now that he is back in sinus rhythm.  He denies chest  pain or shortness of breath.  He notes two episodes of palpitations for  which he thought he went back into A-fib, each lasting 30 to 40 minutes.   PHYSICAL EXAMINATION:  GENERAL:  He is a pleasant, well-appearing man in  no distress.  VITAL SIGNS:  The blood pressure was 150/92, the pulse was 60 and  regular, the respirations were 18, weight was 199 pounds.  NECK:  No jugulovenous distention.  LUNGS:  Clear bilaterally to auscultation, no wheezes, rales, or  rhonchi.  CARDIOVASCULAR:  Regular rate and rhythm with normal S1 and S2.  EXTREMITIES:  No edema.   MEDICATIONS:  1. Enalapril 5 b.i.d.  2. Amiodarone 200 b.i.d.  3. Coumadin as directed.  4. Lovastatin.  5. A multivitamin.   IMPRESSION:  1. Paroxysmal/persistent atrial fibrillation.  2. Chronic Coumadin therapy.  3. Chronic Amiodarone therapy.  4. Hypertension.   DISCUSSION:  [name] is maintaining a sinus rhythm, but his QT  interval is not particularly prolonged and for this reason I am going to  continue him on 400 a day of Amiodarone.  In addition to this, his blood  pressure is very high and I have recommended that we go and give him  HCTZ 12.5 daily to go along with his Enalapril with plans to increase  him to 25 a day after electrolytes have been drawn.  We also may well  end up increasing his Enalapril to 10 twice a  day.  I will plan to see  the patient back in several months.  He is scheduled for laboratory  evaluation.     [name]. Ladona Ridgel, MD  Electronically Signed   GWT/MedQ  DD: 06/26/2006  DT: 06/26/2006  Job #: [id number]

## 2010-07-09 NOTE — Assessment & Plan Note (Signed)
[facility]                            CARDIOLOGY OFFICE NOTE   NAME:[name], JOSTEN                        MRN:          [id number]  DATE:11/10/2007                            DOB:          05-08-1955    PRIMARY CARDIOLOGIST:  [name]. Ladona Ridgel, MD   HISTORY OF PRESENT ILLNESS:  Scott [name] is a 55 year old white male  patient of Dr. Ladona Ridgel who has a long history of paroxysmal atrial  fibrillation.  He has had prior atrial flutter ablation.  He was last  seen by Dr. Ladona Ridgel in June.  At that time, he was doing well, and he  decreased his amiodarone to 400 mg Monday through Thursday and 300 mg  Friday through Sunday.  He did feel he may be a candidate for atrial  fibrillation ablation because of the requirement for high doses of  amiodarone with his relatively young age.   The patient comes in today because over the past 3 weeks, he has had  increased fatigue, decreased exercise tolerance, and recurrent  palpitations.  He says he only notices his heart racing when he lies  down at night.  He says it will speed up and go very fast, and then, it  will stop and restart.  He says he does not notice this during the day.  He exercises on a treadmill for an hour a day and does not notice this,  but he has had to decrease his incline and does not have the exercise  tolerance he had before.  He had not been exercising for a couple of  months because of taking care of his dad, elderly father with  Alzheimer's or dementia, and does not know if it is attributed to trying  to get back into exercise or his arrhythmia.   CURRENT MEDICATIONS:  1. Alprazolam 0.5 mg b.i.d.  2. Citalopram hydrobromide 20 mg daily.  3. Temazepam 15 mg at bedtime.  4. Hydrochlorothiazide 25 mg one half daily.  5. Amiodarone 200 mg b.i.d. Monday through Thursday, 300 mg Friday      through Sunday.  6. Lovastatin 40 mg daily.  7. Coumadin 5 mg as directed.  8. Enalapril 10 mg b.i.d.   PHYSICAL EXAMINATION:  GENERAL:  This is a very pleasant, 55 year old  white male in no acute distress.  VITAL SIGNS:  Blood pressure 171/96, pulse 58, and weight 195.  NECK:  Without JVD, HJR, bruits, or thyroid enlargement.  LUNGS:  Clear anterior, posterior, and lateral.  HEART:  Regular rate and rhythm at 58 beats per minute.  Normal S1 and  S2.  No murmur, rub, bruit, thrill, or heave noted.  ABDOMEN:  Soft  without organomegaly, masses, lesions, or abnormal tenderness.  EXTREMITIES:  Without cyanosis, clubbing, or edema.  He has good distal  pulses.   EKG, sinus bradycardia at 51 beats per minute with first-degree AV  block.   IMPRESSION:  1. Recurrent palpitations, fatigue, and decreased exercise tolerance      probably secondary to paroxysmal atrial fibrillation.  2. Hypertension, uncontrolled.  3. History of atrial flutter, status  post ablation.  4. Chronic amiodarone therapy, higher dose.  5. History of tachycardia-induced cardiomyopathy, now resolved.      Previous ejection fraction 15-20%, now normal.  His last echo in      January 2005 normal.  6. Coumadin therapy.   PLAN:  I have increased his hydrochlorothiazide to 25 mg daily, & asked  him to decrease his salt intake.  I'm reluctant to go up on the  amiodarone with his baseline bradycardia.  We will put an event recorder  on to see what he is doing and refer him to Dr. Johney Frame to see if he is a  candidate for atrial fib ablation.  We will also check labs including a  TSH since he has not had this done in over a year.      Jacolyn Reedy, PA-C  Electronically Signed      Noralyn Pick. Eden Emms, MD, [facility]  Electronically Signed   ML/MedQ  DD: 11/10/2007  DT: 11/10/2007  Job #: [id number]

## 2010-07-09 NOTE — Letter (Signed)
November 22, 2007    Noralyn Pick. Eden Emms, MD, [facility]  [address]  [address]  [place], [place] [address]   RE:  DESHUN, SEDIVY  MRN:  [id number]  /  DOB:  [birth year]   Dear Theron Arista,   It is my pleasure to see your patient Shawntez Dickison in Electrophysiology  Clinic.  As you will recall, [name] is a very pleasant 55 year old  gentleman with paroxysmal atrial fibrillation.  He initially presented  in July 2005 with symptoms of heart racing and shortness of breath.  He  was found to have isthmus-dependent atrial flutter and underwent  successful radiofrequency ablation of atrial flutter by Dr. Lewayne Bunting  at that time.  He reports doing well for approximately a month before  developing symptomatic atrial fibrillation.  He was cardioverted and  initiated on metoprolol.  The patient failed medical therapy with  metoprolol and subsequently was placed on amiodarone.  He has required  escalating doses of amiodarone and is presently taking 300 and 400 mg  daily alternating.  Despite amiodarone therapy, he reports symptomatic  palpitations.  Initially, these episodes occurred every 12-18 months.  He would then develop sustained atrial fibrillation, requiring  cardioversion.  He estimates that over the past month, he has had  increasing episodes of atrial fibrillation, but has not required  cardioversion.  He reports palpitations with shortness of breath,  decreased exercise capacity, and fatigue lasting several hours.  These  episodes now occur 2-3 times per week.  He recently presented for  evaluation and had an event monitor placed by you.  [name] has  previously undergone transthoracic echocardiogram on November 15, 2007,  which revealed an ejection fraction of 65-75% with a mildly dilated left  atrium.  No other significant valvular abnormalities were appreciated.   REVIEW OF SYSTEMS:  The patient on review of systems described symptoms  of somnolence as well as snoring at night.  Sleep  study has not  previously been performed.  The patient also reports over the past few  months, he has had exertional chest discomfort, which he describes as  chest tightness with moderate activities.  He has also noticed  occasional shortness of breath at night when supine.  He denies near  syncope, syncope, symptoms of heart failure, or other concerns at this  time.   PAST MEDICAL HISTORY:  1. Atrial fibrillation (as above).  2. Status post successful cavotricuspid isthmus ablation by Dr. Lewayne Bunting, July 2005.  3. Hypertension.  4. Symptoms of sleep apnea.  5. Anxiety with difficulty sleeping.   MEDICATIONS:  1. Xanax 0.5 mg b.i.d.  2. Citalopram 20 mg daily.  3. Temazepam 15-30 mg p.r.n.  4. Hydrochlorothiazide 12.5 mg nightly.  5. Amiodarone 200 mg b.i.d.  6. Lovastatin 40 mg daily.  7. Coumadin 5 mg daily.  8. Enalapril 10 mg b.i.d.   ALLERGIES:  No known drug allergies.   SOCIAL HISTORY:  [place]  his friend.  He denies tobacco, alcohol, or drug use.   FAMILY HISTORY:  The patient's father has dementia.  His mother died of  a stroke.  He has complication with atrial fibrillation.   REVIEW OF SYSTEMS:  All systems are reviewed and negative except as  outline above.   PHYSICAL EXAMINATION:  VITAL SIGNS:  Blood pressure 157/94, heart rate  66, respirations 18, weight 195 pounds.  GENERAL:  The patient is an obese male  in no acute distress.  He is  alert and oriented x3.  HEENT:  Normocephalic and atraumatic.  Sclerae clear.  Conjunctivae  pink.  Oropharynx clear.  NECK:  Supple.  No JVD, lymphadenopathy, or bruits.  LUNGS:  Clear to auscultation bilaterally.  HEART:  Regular rate and rhythm.  No murmurs, rubs, or gallops.  GI:  Soft, nontender, and nondistended.  Positive bowel sounds.  EXTREMITIES:  No clubbing, cyanosis, or edema.  NEUROLOGIC:  Strength and sensation are intact.  SKIN:  No ecchymosis or  laceration.  MUSCULOSKELETAL:  No deformity or atrophy.  PSYCH:  Euthymic mood.  Flat affect.   EKG, sinus bradycardia at 50 beats per minute, first-degree AV block  with a PR interval of 228 milliseconds, otherwise unremarkable.   IMPRESSION:  [name] is a very pleasant gentleman with symptomatic  atrial fibrillation, who presents today to discuss therapeutic  strategies for atrial fibrillation.  The patient has increasing  frequency and duration of symptomatic atrial fibrillation despite high-  dose amiodarone.  He reports difficulty with sun sensitivity and  exposure while taking amiodarone.  He has also previously failed medical  therapy with metoprolol.  He is chronically anticoagulated with  Coumadin.  The patient also presents with symptoms of sleep apnea and  exertional chest discomfort.   PLAN:  A GXT-MIBI is ordered today to further evaluate the patient's  chest discomfort to rule out ischemia.  We will also order a sleep study  to evaluate for sleep apnea.  Therapeutic strategies for atrial  fibrillation including both medicine and catheter-based therapies were  discussed in detail with the patient today.  The risk, benefits, and  alternatives of EP study and radiofrequency ablation for atrial  fibrillation were also discussed.  The patient wishes to proceed with  atrial fibrillation ablation.  We will therefore schedule the patient  for an atrial fibrillation ablation after his stress test, should the  patient have an evidence of ischemia.  Should the patient require  further evaluation of ischemia, we would defer his atrial fibrillation  ablation therapy until the appropriate time.    Sincerely,      [name], MD  Electronically Signed    JA/MedQ  DD: 11/22/2007  DT: 11/23/2007  Job #: [id number]   CC:   Noralyn Pick. Eden Emms, MD, [facility]  [name]. Ladona Ridgel, MD  Corwin Levins, MD

## 2010-07-16 ENCOUNTER — Encounter: Payer: Self-pay | Admitting: Internal Medicine

## 2010-07-16 NOTE — Assessment & Plan Note (Signed)
For tx as per emr

## 2010-07-19 ENCOUNTER — Other Ambulatory Visit: Payer: Self-pay

## 2010-07-19 MED ORDER — LOVASTATIN 40 MG PO TABS: 40.0000 mg | ORAL_TABLET | Freq: Two times a day (BID) | ORAL | Status: DC

## 2010-08-01 ENCOUNTER — Ambulatory Visit (INDEPENDENT_AMBULATORY_CARE_PROVIDER_SITE_OTHER): Payer: PRIVATE HEALTH INSURANCE | Admitting: *Deleted

## 2010-08-01 DIAGNOSIS — I4892 Unspecified atrial flutter: Secondary | ICD-10-CM

## 2010-08-01 DIAGNOSIS — I4891 Unspecified atrial fibrillation: Secondary | ICD-10-CM

## 2010-08-01 DIAGNOSIS — Z7901 Long term (current) use of anticoagulants: Secondary | ICD-10-CM

## 2010-08-13 ENCOUNTER — Encounter: Payer: Self-pay | Admitting: Internal Medicine

## 2010-08-14 ENCOUNTER — Ambulatory Visit (INDEPENDENT_AMBULATORY_CARE_PROVIDER_SITE_OTHER): Payer: Self-pay | Admitting: Internal Medicine

## 2010-08-14 ENCOUNTER — Encounter: Payer: Self-pay | Admitting: Internal Medicine

## 2010-08-14 VITALS — BP 122/80 | HR 52 | Ht 66.0 in | Wt 200.0 lb

## 2010-08-14 DIAGNOSIS — I10 Essential (primary) hypertension: Secondary | ICD-10-CM

## 2010-08-14 DIAGNOSIS — I4891 Unspecified atrial fibrillation: Secondary | ICD-10-CM

## 2010-08-14 MED ORDER — NADOLOL 20 MG PO TABS: 20.0000 mg | ORAL_TABLET | Freq: Every day | ORAL | Status: DC

## 2010-08-14 NOTE — Assessment & Plan Note (Signed)
Doing well s/p ablation Recent event monitor reveals pacs and pvcs as the cause for his palpitations.  He appears to not be having sustained afib. We will decrease amiodarone to 100mg  daily today. He will follow-up with Dr Ladona Ridgel in 3months.  I would favor stopping amiodarone at that time if no further afib given his longstanding difficulty with SOB.

## 2010-08-14 NOTE — Progress Notes (Signed)
The patient presents today for routine electrophysiology followup.  Since last being seen in our clinic, the patient reports doing very well.  He has had no afib.  His SOB is stable.  Today, he denies symptoms of palpitations, chest pain,orthopnea, PND, lower extremity edema, dizziness, presyncope, syncope, or neurologic sequela.  The patient feels that he is tolerating medications without difficulties and is otherwise without complaint today.   Past Medical History  Diagnosis Date  . HYPOTHYROIDISM, ACQUIRED NEC 09/28/2006  . HYPERLIPIDEMIA 09/28/2006  . ANXIETY 09/28/2006  . OBSTRUCTIVE SLEEP APNEA 01/06/2008  . HYPERTENSION 09/28/2006  . Atrial fibrillation 09/28/2006    s/p afib ablation x 2  . Diastolic dysfunction 09/28/2006  . ALLERGIC RHINITIS 01/14/2007  . CHRONIC OBSTRUCTIVE PULMONARY DISEASE, ACUTE EXACERBATION 10/04/2008  . Long term current use of anticoagulant 04/08/2010  . Right knee DJD 07/09/2010   Past Surgical History  Procedure Date  . Hand surgury     left  . Radio frequency ablation for atrial flutter 2005    CTI  . Radio frequency ablation for atrial fibrillation 12/30/07 and 06/05/09    afib ablation x 2 by JA    Current Outpatient Prescriptions  Medication Sig Dispense Refill  . ALPRAZolam (XANAX) 0.5 MG tablet Take 1 tablet (0.5 mg total) by mouth 2 (two) times daily as needed for anxiety.  60 tablet  2  . amLODipine (NORVASC) 5 MG tablet Take 5 mg by mouth daily.        . budesonide-formoterol (SYMBICORT) 160-4.5 MCG/ACT inhaler Inhale 2 puffs into the lungs 2 (two) times daily.  3 Inhaler  3  . citalopram (CELEXA) 20 MG tablet Take 20 mg by mouth daily.        . enalapril (VASOTEC) 10 MG tablet Take 10 mg by mouth daily.        . hydrochlorothiazide 25 MG tablet Take 25 mg by mouth daily.        Marland Kitchen HYDROcodone-acetaminophen (NORCO) 5-325 MG per tablet Take 1 tablet by mouth 2 (two) times daily as needed.        . nadolol (CORGARD) 20 MG tablet Take 1 tablet (20 mg total)  by mouth daily.  90 tablet  3  . temazepam (RESTORIL) 30 MG capsule Take 30 mg by mouth at bedtime as needed.        . warfarin (COUMADIN) 2.5 MG tablet FOLLOW DIRECTIONS PROVIDED BY PHYSICIAN  90 tablet  3  . DISCONTD: amiodarone (PACERONE) 200 MG tablet Take 200 mg by mouth daily.        Marland Kitchen DISCONTD: lovastatin (MEVACOR) 40 MG tablet Take 1 tablet (40 mg total) by mouth 2 (two) times daily.  180 tablet  3  . DISCONTD: nadolol (CORGARD) 20 MG tablet Take 20 mg by mouth daily.        Marland Kitchen albuterol (PROAIR HFA) 108 (90 BASE) MCG/ACT inhaler Inhale 2 puffs into the lungs 4 (four) times daily.       Marland Kitchen amiodarone (PACERONE) 200 MG tablet Take 1/2 tablet by mouth once daily      . lovastatin (MEVACOR) 40 MG tablet Take two tablets by mouth once daily        Allergies  Allergen Reactions  . Zolpidem Tartrate     REACTION: bad hangover    History   Social History  . Marital Status: Single    Spouse Name: N/A    Number of Children: N/A  . Years of Education: N/A   Occupational History  .  worked previously in the [employer] and feels that he was exposed to asbestos.    Social History Main Topics  . Smoking status: Former Games developer  . Smokeless tobacco: Not on file   Comment: not smoked over 10 years  . Alcohol Use: No  . Drug Use: No  . Sexually Active: Not on file   Other Topics Concern  . Not on file   Social History Narrative   Pt lives in [place], [place] with his friend.     Family History  Problem Relation Age of Onset  . Dementia Father   . Hypertension Father     ROS-  All systems are reviewed and are negative except as outlined in the HPI above    Physical Exam: Filed Vitals:   08/14/10 0936  BP: 122/80  Pulse: 52  Height: 5\' 6"  (1.676 m)  Weight: 200 lb (90.719 kg)    GEN- The patient is well appearing, alert and oriented x 3 today.   Head- normocephalic, atraumatic Eyes-  Sclera clear, conjunctiva pink Ears- hearing intact Oropharynx- clear Neck- supple,  no JVP Lymph- no cervical lymphadenopathy Lungs- prolonged expiratory phase, normal work of breathing Heart- Regular rate and rhythm, no murmurs, rubs or gallops, PMI not laterally displaced GI- soft, NT, ND, + BS Extremities- no clubbing, cyanosis, or edema MS- no significant deformity or atrophy Skin- no rash or lesion Psych- euthymic mood, full affect Neuro- strength and sensation are intact  Assessment and Plan:

## 2010-08-14 NOTE — Patient Instructions (Signed)
Your physician has recommended you make the following change in your medication:  1) Decrease amiodarone to 200mg  1/2 tablet by mouth once daily.  Your physician recommends that you schedule a follow-up appointment in: 3 months with Dr. Ladona Ridgel.

## 2010-08-14 NOTE — Assessment & Plan Note (Signed)
Salt restriciton

## 2010-08-21 ENCOUNTER — Telehealth: Payer: Self-pay | Admitting: Internal Medicine

## 2010-08-21 MED ORDER — ALPRAZOLAM 0.5 MG PO TABS: 0.5000 mg | ORAL_TABLET | Freq: Two times a day (BID) | ORAL | Status: DC | PRN

## 2010-08-21 NOTE — Telephone Encounter (Signed)
Done hardcopy to dahlia/LIM B  

## 2010-08-21 NOTE — Telephone Encounter (Signed)
Rx faxed to pharmacy  

## 2010-08-26 ENCOUNTER — Telehealth: Payer: Self-pay | Admitting: *Deleted

## 2010-08-26 MED ORDER — TEMAZEPAM 30 MG PO CAPS: 30.0000 mg | ORAL_CAPSULE | Freq: Every evening | ORAL | Status: DC | PRN

## 2010-08-26 NOTE — Telephone Encounter (Signed)
Request for Temazepam 30mg  cap [last fill date 02/12/10 #30x5]

## 2010-08-26 NOTE — Telephone Encounter (Signed)
Done hardcopy to dahlia/LIM B  

## 2010-08-27 NOTE — Telephone Encounter (Signed)
Rx faxed to pharmacy  

## 2010-08-29 ENCOUNTER — Ambulatory Visit (INDEPENDENT_AMBULATORY_CARE_PROVIDER_SITE_OTHER): Payer: Self-pay | Admitting: *Deleted

## 2010-08-29 DIAGNOSIS — I4892 Unspecified atrial flutter: Secondary | ICD-10-CM

## 2010-08-29 DIAGNOSIS — I4891 Unspecified atrial fibrillation: Secondary | ICD-10-CM

## 2010-08-29 DIAGNOSIS — Z7901 Long term (current) use of anticoagulants: Secondary | ICD-10-CM

## 2010-09-26 ENCOUNTER — Encounter: Payer: Self-pay | Admitting: *Deleted

## 2010-10-01 ENCOUNTER — Other Ambulatory Visit: Payer: Self-pay

## 2010-10-01 MED ORDER — HYDROCODONE-ACETAMINOPHEN 5-325 MG PO TABS: 1.0000 | ORAL_TABLET | Freq: Two times a day (BID) | ORAL | Status: DC | PRN

## 2010-10-02 NOTE — Telephone Encounter (Signed)
Faxed hardcopy to [facility] at [phone]

## 2010-10-16 ENCOUNTER — Ambulatory Visit (INDEPENDENT_AMBULATORY_CARE_PROVIDER_SITE_OTHER): Payer: Self-pay | Admitting: *Deleted

## 2010-10-16 DIAGNOSIS — Z7901 Long term (current) use of anticoagulants: Secondary | ICD-10-CM

## 2010-10-16 DIAGNOSIS — I4891 Unspecified atrial fibrillation: Secondary | ICD-10-CM

## 2010-10-16 DIAGNOSIS — I4892 Unspecified atrial flutter: Secondary | ICD-10-CM

## 2010-10-16 LAB — POCT INR: INR: 1.8

## 2010-11-11 ENCOUNTER — Ambulatory Visit: Payer: Self-pay | Admitting: Internal Medicine

## 2010-11-14 ENCOUNTER — Ambulatory Visit (INDEPENDENT_AMBULATORY_CARE_PROVIDER_SITE_OTHER): Payer: PRIVATE HEALTH INSURANCE | Admitting: *Deleted

## 2010-11-14 ENCOUNTER — Ambulatory Visit (INDEPENDENT_AMBULATORY_CARE_PROVIDER_SITE_OTHER): Payer: PRIVATE HEALTH INSURANCE | Admitting: Internal Medicine

## 2010-11-14 ENCOUNTER — Encounter: Payer: Self-pay | Admitting: Internal Medicine

## 2010-11-14 DIAGNOSIS — Z7901 Long term (current) use of anticoagulants: Secondary | ICD-10-CM

## 2010-11-14 DIAGNOSIS — I4891 Unspecified atrial fibrillation: Secondary | ICD-10-CM

## 2010-11-14 DIAGNOSIS — I4892 Unspecified atrial flutter: Secondary | ICD-10-CM

## 2010-11-14 DIAGNOSIS — I495 Sick sinus syndrome: Secondary | ICD-10-CM

## 2010-11-14 DIAGNOSIS — I509 Heart failure, unspecified: Secondary | ICD-10-CM

## 2010-11-14 LAB — POCT INR: INR: 1.6

## 2010-11-14 NOTE — Patient Instructions (Signed)
Your physician wants you to follow-up in: 12 months with Dr. Taylor. You will receive a reminder letter in the mail two months in advance. If you don't receive a letter, please call our office to schedule the follow-up appointment.    

## 2010-11-14 NOTE — Assessment & Plan Note (Signed)
The patient is considering switching anticoagulation regimens. He will call us if he decides to start taking Pradaxa.

## 2010-11-14 NOTE — Progress Notes (Signed)
HPI Scott [name] returns today for ongoing followup of atrial fibrillation. He is a pleasant middle-aged man with a tachycardia induced cardiomyopathy, status post flutter ablation, status post development of atrial fibrillation. The patient has undergone atrial fibrillation ablation as well. On low dose amiodarone, he is maintaining sinus rhythm after his ablation. The patient has chronic class II dyspnea which is thought to be multifactorial. Evaluation to date has been unrevealing for any particular dominant cause. The patient is working full time, and exercises by walking almost every day. Some days are better than others. He has difficulty with inclines. He denies cough or hemoptysis. He has struggled with his weight in recent months. Allergies  Allergen Reactions  . Zolpidem Tartrate     REACTION: bad hangover     Current Outpatient Prescriptions  Medication Sig Dispense Refill  . albuterol (PROAIR HFA) 108 (90 BASE) MCG/ACT inhaler Inhale 2 puffs into the lungs 4 (four) times daily.       Marland Kitchen ALPRAZolam (XANAX) 0.5 MG tablet Take 1 tablet (0.5 mg total) by mouth 2 (two) times daily as needed for anxiety.  60 tablet  3  . amiodarone (PACERONE) 200 MG tablet Take 1/2 tablet by mouth once daily      . amLODipine (NORVASC) 5 MG tablet Take 5 mg by mouth daily.        . budesonide-formoterol (SYMBICORT) 160-4.5 MCG/ACT inhaler Inhale 2 puffs into the lungs 2 (two) times daily.  3 Inhaler  3  . citalopram (CELEXA) 20 MG tablet Take 20 mg by mouth daily.        . enalapril (VASOTEC) 10 MG tablet Take 10 mg by mouth daily.        . hydrochlorothiazide 25 MG tablet Take 25 mg by mouth daily.        Marland Kitchen HYDROcodone-acetaminophen (NORCO) 5-325 MG per tablet Take 1 tablet by mouth 2 (two) times daily as needed.  60 tablet  2  . lovastatin (MEVACOR) 40 MG tablet Take two tablets by mouth once daily      . nadolol (CORGARD) 20 MG tablet Take 1 tablet (20 mg total) by mouth daily.  90 tablet  3  . temazepam  (RESTORIL) 30 MG capsule Take 1 capsule (30 mg total) by mouth at bedtime as needed.  30 capsule  5  . warfarin (COUMADIN) 2.5 MG tablet FOLLOW DIRECTIONS PROVIDED BY PHYSICIAN  90 tablet  3     Past Medical History  Diagnosis Date  . HYPOTHYROIDISM, ACQUIRED NEC 09/28/2006  . HYPERLIPIDEMIA 09/28/2006  . ANXIETY 09/28/2006  . OBSTRUCTIVE SLEEP APNEA 01/06/2008  . HYPERTENSION 09/28/2006  . Atrial fibrillation 09/28/2006    s/p afib ablation x 2  . Diastolic dysfunction 09/28/2006  . ALLERGIC RHINITIS 01/14/2007  . CHRONIC OBSTRUCTIVE PULMONARY DISEASE, ACUTE EXACERBATION 10/04/2008  . Long term current use of anticoagulant 04/08/2010  . Right knee DJD 07/09/2010    ROS:   All systems reviewed and negative except as noted in the HPI.   Past Surgical History  Procedure Date  . Hand surgury     left  . Radio frequency ablation for atrial flutter 2005    CTI  . Radio frequency ablation for atrial fibrillation 12/30/07 and 06/05/09    afib ablation x 2 by JA     Family History  Problem Relation Age of Onset  . Dementia Father   . Hypertension Father      History   Social History  . Marital Status: Single  Spouse Name: N/A    Number of Children: N/A  . Years of Education: N/A   Occupational History  . worked previously in the [employer] and feels that he was exposed to asbestos.    Social History Main Topics  . Smoking status: Former Games developer  . Smokeless tobacco: Not on file   Comment: not smoked over 10 years  . Alcohol Use: No  . Drug Use: No  . Sexually Active: Not on file   Other Topics Concern  . Not on file   Social History Narrative   Pt lives in [place], [place] with his friend.      BP 122/74  Pulse 49  Ht 5\' 6"  (1.676 m)  Wt 210 lb 12.8 oz (95.618 kg)  BMI 34.02 kg/m2  Physical Exam:  Well appearing middle aged man, NAD HEENT: Unremarkable Neck:  No JVD, no thyromegally Lymphatics:  No adenopathy Back:  No CVA tenderness Lungs:  Clear with no  wheezes, rales, or rhonchi. HEART:  Regular bradycardia, no murmurs, no rubs, no clicks Abd:  soft, positive bowel sounds, no organomegally, no rebound, no guarding Ext:  2 plus pulses, no edema, no cyanosis, no clubbing Skin:  No rashes no nodules Neuro:  CN II through XII intact, motor grossly intact  EKG Sinus bradycardia with first degree AV block  Assess/Plan:

## 2010-11-14 NOTE — Assessment & Plan Note (Signed)
He appears to be maintaining sinus rhythm on low dose amiodarone. He will also continue his beta blocker. We'll plan to see the patient back in one year, sooner if his atrial fibrillation returns.

## 2010-11-14 NOTE — Assessment & Plan Note (Signed)
His chronic diastolic heart failure appears to be class II. He will continue his current medical therapy. I have asked that he maintain a low-sodium diet and walk on a regular basis.

## 2010-11-18 NOTE — Progress Notes (Signed)
Addended by: Burnett Kanaris A on: 11/18/2010 03:41 PM   Modules accepted: Orders

## 2010-11-26 LAB — PROTIME-INR: INR: 3.1 — ABNORMAL HIGH

## 2010-11-27 ENCOUNTER — Ambulatory Visit (INDEPENDENT_AMBULATORY_CARE_PROVIDER_SITE_OTHER): Payer: PRIVATE HEALTH INSURANCE | Admitting: *Deleted

## 2010-11-27 DIAGNOSIS — I4891 Unspecified atrial fibrillation: Secondary | ICD-10-CM

## 2010-11-27 DIAGNOSIS — Z7901 Long term (current) use of anticoagulants: Secondary | ICD-10-CM

## 2010-11-27 DIAGNOSIS — I4892 Unspecified atrial flutter: Secondary | ICD-10-CM

## 2010-12-12 ENCOUNTER — Encounter: Payer: Self-pay | Admitting: Internal Medicine

## 2010-12-12 ENCOUNTER — Other Ambulatory Visit (INDEPENDENT_AMBULATORY_CARE_PROVIDER_SITE_OTHER): Payer: PRIVATE HEALTH INSURANCE

## 2010-12-12 ENCOUNTER — Ambulatory Visit (INDEPENDENT_AMBULATORY_CARE_PROVIDER_SITE_OTHER): Payer: PRIVATE HEALTH INSURANCE | Admitting: Internal Medicine

## 2010-12-12 VITALS — BP 132/76 | HR 53 | Temp 97.1°F | Ht 65.0 in | Wt 208.0 lb

## 2010-12-12 DIAGNOSIS — J4489 Other specified chronic obstructive pulmonary disease: Secondary | ICD-10-CM

## 2010-12-12 DIAGNOSIS — R310 Gross hematuria: Secondary | ICD-10-CM

## 2010-12-12 DIAGNOSIS — M25569 Pain in unspecified knee: Secondary | ICD-10-CM

## 2010-12-12 DIAGNOSIS — M25562 Pain in left knee: Secondary | ICD-10-CM | POA: Insufficient documentation

## 2010-12-12 DIAGNOSIS — J449 Chronic obstructive pulmonary disease, unspecified: Secondary | ICD-10-CM

## 2010-12-12 DIAGNOSIS — I10 Essential (primary) hypertension: Secondary | ICD-10-CM

## 2010-12-12 LAB — URINALYSIS, ROUTINE W REFLEX MICROSCOPIC
Hgb urine dipstick: NEGATIVE
Ketones, ur: NEGATIVE
Specific Gravity, Urine: 1.025 (ref 1.000–1.030)
Total Protein, Urine: NEGATIVE
Urine Glucose: NEGATIVE
Urobilinogen, UA: 0.2 (ref 0.0–1.0)

## 2010-12-12 MED ORDER — TRAMADOL-ACETAMINOPHEN 37.5-325 MG PO TABS: 1.0000 | ORAL_TABLET | Freq: Four times a day (QID) | ORAL | Status: DC | PRN

## 2010-12-12 NOTE — Progress Notes (Signed)
  Subjective:    Patient ID: Scott [name], male    DOB: October 15, 1955, 55 y.o.   MRN: [id number]  HPI    Here to f/u with c/o  2 episode 2 days ago of BRB in the urine with pain on urination at that time, but none since, no hx of prior renal stone and no fever, no symtpoms since then.  Incidentally also with left leg numb and pain with radiation to just below the knee, no back pain, but also with weakness he found with squatting at work last night, with bilat knee pain - had to pull with arms to stand up; ongoing x  3 days as well. Pt denies chest pain, increased sob or doe, wheezing, orthopnea, PND, increased LE swelling, palpitations, dizziness or syncope.  Pt denies new neurological symptoms such as new headache, or facial or extremity weakness or numbness   Pt denies polydipsia, polyuria.   Pt denies fever, wt loss, night sweats, loss of appetite, or other constitutional symptoms  Past Medical History  Diagnosis Date  . HYPOTHYROIDISM, ACQUIRED NEC 09/28/2006  . HYPERLIPIDEMIA 09/28/2006  . ANXIETY 09/28/2006  . OBSTRUCTIVE SLEEP APNEA 01/06/2008  . HYPERTENSION 09/28/2006  . Atrial fibrillation 09/28/2006    s/p afib ablation x 2  . Diastolic dysfunction 09/28/2006  . ALLERGIC RHINITIS 01/14/2007  . CHRONIC OBSTRUCTIVE PULMONARY DISEASE, ACUTE EXACERBATION 10/04/2008  . Long term current use of anticoagulant 04/08/2010  . Right knee DJD 07/09/2010   Past Surgical History  Procedure Date  . Hand surgury     left  . Radio frequency ablation for atrial flutter 2005    CTI  . Radio frequency ablation for atrial fibrillation 12/30/07 and 06/05/09    afib ablation x 2 by JA    reports that he has quit smoking. He does not have any smokeless tobacco history on file. He reports that he does not drink alcohol or use illicit drugs. family history includes Dementia in his father and Hypertension in his father. Allergies  Allergen Reactions  . Zolpidem Tartrate     REACTION: bad hangover   Review of  Systems Review of Systems  Constitutional: Negative for diaphoresis and unexpected weight change.  HENT: Negative for drooling and tinnitus.   Eyes: Negative for photophobia and visual disturbance.  Respiratory: Negative for choking and stridor.   Gastrointestinal: Negative for vomiting and blood in stool.  Genitourinary: Negative for hematuria and decreased urine volume. .      Objective:   Physical Exam BP 132/76  Pulse 53  Temp(Src) 97.1 F (36.2 C) (Oral)  Ht 5\' 5"  (1.651 m)  Wt 208 lb (94.348 kg)  BMI 34.61 kg/m2  SpO2 97% Physical Exam  VS noted Constitutional: Pt appears well-developed and well-nourished.  HENT: Head: Normocephalic.  Right Ear: External ear normal.  Left Ear: External ear normal.  Eyes: Conjunctivae and EOM are normal. Pupils are equal, round, and reactive to light.  Neck: Normal range of motion. Neck supple.  Cardiovascular: Normal rate and regular rhythm.   Pulmonary/Chest: Effort normal and breath sounds normal.  Abd:  Soft, NT, non-distended, + BS Neurological: Pt is alert. No cranial nerve deficit. motor 4+5 LLE but ? Due to pain Skin: Skin is warm. No erythema.  Psychiatric: Pt behavior is normal. Thought content normal.  bilat knees with marked crepitus     Assessment & Plan:

## 2010-12-12 NOTE — Patient Instructions (Signed)
Take all new medications as prescribed - the pain medication Continue all other medications as before Please go to LAB in the Basement for the urine tests to be done today Please call the phone number [phone] (the PhoneTree System) for results of testing in 2-3 days;  When calling, simply dial the number, and when prompted enter the MRN number above (the Medical Record Number) and the # key, then the message should start. You will be contacted regarding the referral for: urology, and orthopedic

## 2010-12-12 NOTE — Assessment & Plan Note (Signed)
stable overall by hx and exam, most recent data reviewed with pt, and pt to continue medical treatment as before  SpO2 Readings from Last 3 Encounters:  12/12/10 97%  07/09/10 95%  02/12/10 94%

## 2010-12-12 NOTE — Assessment & Plan Note (Addendum)
I believe this is source of his left upper leg pain; no LBP, for ortho referral, pain control,  to f/u any worsening symptoms or concerns

## 2010-12-12 NOTE — Progress Notes (Signed)
Quick Note:  Voice message left on PhoneTree system - lab is negative, normal or otherwise stable, pt to continue same tx ______ 

## 2010-12-12 NOTE — Assessment & Plan Note (Signed)
stable overall by hx and exam, most recent data reviewed with pt, and pt to continue medical treatment as before BP Readings from Last 3 Encounters:  12/12/10 132/76  11/14/10 122/74  08/14/10 122/80

## 2010-12-12 NOTE — Assessment & Plan Note (Signed)
On coumadin, with recent INR approx 2.6 early October, no other bleeding and hematuria small volume, will need ua and cx, but also refer to urology to r/o malignancy

## 2010-12-13 ENCOUNTER — Telehealth: Payer: Self-pay | Admitting: *Deleted

## 2010-12-13 MED ORDER — TRAMADOL HCL 50 MG PO TABS: 50.0000 mg | ORAL_TABLET | Freq: Four times a day (QID) | ORAL | Status: AC | PRN

## 2010-12-13 NOTE — Telephone Encounter (Signed)
R'cd fax from [facility] stating that Ultracet was too expensive-pt is requesting change to Tramadol 50mg -okay to change?

## 2010-12-13 NOTE — Telephone Encounter (Signed)
Done per emr 

## 2010-12-24 ENCOUNTER — Other Ambulatory Visit: Payer: Self-pay

## 2010-12-24 MED ORDER — ALPRAZOLAM 0.5 MG PO TABS: 0.5000 mg | ORAL_TABLET | Freq: Two times a day (BID) | ORAL | Status: DC | PRN

## 2010-12-25 ENCOUNTER — Ambulatory Visit (INDEPENDENT_AMBULATORY_CARE_PROVIDER_SITE_OTHER): Payer: Self-pay | Admitting: *Deleted

## 2010-12-25 DIAGNOSIS — I4891 Unspecified atrial fibrillation: Secondary | ICD-10-CM

## 2010-12-25 DIAGNOSIS — I4892 Unspecified atrial flutter: Secondary | ICD-10-CM

## 2010-12-25 DIAGNOSIS — Z7901 Long term (current) use of anticoagulants: Secondary | ICD-10-CM

## 2010-12-25 LAB — POCT INR: INR: 2.2

## 2010-12-25 NOTE — Telephone Encounter (Signed)
Faxed hardcopy to pharmacy. 

## 2011-01-14 ENCOUNTER — Other Ambulatory Visit: Payer: Self-pay | Admitting: *Deleted

## 2011-01-22 ENCOUNTER — Ambulatory Visit (INDEPENDENT_AMBULATORY_CARE_PROVIDER_SITE_OTHER): Payer: Self-pay | Admitting: *Deleted

## 2011-01-22 DIAGNOSIS — I4892 Unspecified atrial flutter: Secondary | ICD-10-CM

## 2011-01-22 DIAGNOSIS — I4891 Unspecified atrial fibrillation: Secondary | ICD-10-CM

## 2011-01-22 DIAGNOSIS — Z7901 Long term (current) use of anticoagulants: Secondary | ICD-10-CM

## 2011-01-22 LAB — POCT INR: INR: 2.1

## 2011-02-19 ENCOUNTER — Ambulatory Visit (INDEPENDENT_AMBULATORY_CARE_PROVIDER_SITE_OTHER): Payer: Self-pay | Admitting: *Deleted

## 2011-02-19 DIAGNOSIS — Z7901 Long term (current) use of anticoagulants: Secondary | ICD-10-CM

## 2011-02-19 DIAGNOSIS — I4892 Unspecified atrial flutter: Secondary | ICD-10-CM

## 2011-02-19 DIAGNOSIS — I4891 Unspecified atrial fibrillation: Secondary | ICD-10-CM

## 2011-02-19 LAB — POCT INR: INR: 2.5

## 2011-02-26 ENCOUNTER — Other Ambulatory Visit: Payer: Self-pay

## 2011-02-26 MED ORDER — TEMAZEPAM 30 MG PO CAPS: 30.0000 mg | ORAL_CAPSULE | Freq: Every evening | ORAL | Status: DC | PRN

## 2011-02-26 NOTE — Telephone Encounter (Signed)
Done hardcopy to robin  

## 2011-02-27 NOTE — Telephone Encounter (Signed)
Faxed hardcopy to pharmacy. 

## 2011-03-05 ENCOUNTER — Other Ambulatory Visit: Payer: Self-pay | Admitting: Internal Medicine

## 2011-03-10 ENCOUNTER — Ambulatory Visit: Payer: Self-pay

## 2011-03-11 ENCOUNTER — Ambulatory Visit (INDEPENDENT_AMBULATORY_CARE_PROVIDER_SITE_OTHER): Payer: Self-pay

## 2011-03-11 DIAGNOSIS — Z23 Encounter for immunization: Secondary | ICD-10-CM

## 2011-04-02 ENCOUNTER — Other Ambulatory Visit: Payer: Self-pay

## 2011-04-02 ENCOUNTER — Ambulatory Visit (INDEPENDENT_AMBULATORY_CARE_PROVIDER_SITE_OTHER): Payer: Self-pay | Admitting: Pharmacist

## 2011-04-02 DIAGNOSIS — I4891 Unspecified atrial fibrillation: Secondary | ICD-10-CM

## 2011-04-02 DIAGNOSIS — Z7901 Long term (current) use of anticoagulants: Secondary | ICD-10-CM

## 2011-04-02 DIAGNOSIS — I4892 Unspecified atrial flutter: Secondary | ICD-10-CM

## 2011-04-02 MED ORDER — HYDROCHLOROTHIAZIDE 25 MG PO TABS: 25.0000 mg | ORAL_TABLET | Freq: Every day | ORAL | Status: DC

## 2011-04-02 MED ORDER — ENALAPRIL MALEATE 10 MG PO TABS: 10.0000 mg | ORAL_TABLET | Freq: Every day | ORAL | Status: DC

## 2011-04-02 NOTE — Telephone Encounter (Signed)
..   Requested Prescriptions   Signed Prescriptions Disp Refills  . enalapril (VASOTEC) 10 MG tablet 90 tablet 11    Sig: Take 1 tablet (10 mg total) by mouth daily.    Authorizing Provider: [name] D    Ordering User: Lacie Scotts hydrochlorothiazide (HYDRODIURIL) 25 MG tablet 90 tablet 11    Sig: Take 1 tablet (25 mg total) by mouth daily.    Authorizing Provider: [name] D    Ordering User: Lacie Scotts   Verbal order given by Dr.Allred during walk-in to [facility] today.

## 2011-04-07 ENCOUNTER — Other Ambulatory Visit: Payer: Self-pay | Admitting: Internal Medicine

## 2011-04-28 ENCOUNTER — Other Ambulatory Visit: Payer: Self-pay

## 2011-04-28 MED ORDER — ENALAPRIL MALEATE 10 MG PO TABS: 10.0000 mg | ORAL_TABLET | Freq: Every day | ORAL | Status: DC

## 2011-05-05 ENCOUNTER — Other Ambulatory Visit: Payer: Self-pay

## 2011-05-05 MED ORDER — ALPRAZOLAM 0.5 MG PO TABS: 0.5000 mg | ORAL_TABLET | Freq: Two times a day (BID) | ORAL | Status: DC | PRN

## 2011-05-05 MED ORDER — HYDROCODONE-ACETAMINOPHEN 5-325 MG PO TABS: 1.0000 | ORAL_TABLET | Freq: Two times a day (BID) | ORAL | Status: DC | PRN

## 2011-05-05 NOTE — Telephone Encounter (Signed)
Done hardcopy to robin  

## 2011-05-05 NOTE — Telephone Encounter (Signed)
Faxed hardcopy to pharmacy. 

## 2011-05-14 ENCOUNTER — Ambulatory Visit (INDEPENDENT_AMBULATORY_CARE_PROVIDER_SITE_OTHER): Payer: Self-pay | Admitting: *Deleted

## 2011-05-14 DIAGNOSIS — Z7901 Long term (current) use of anticoagulants: Secondary | ICD-10-CM

## 2011-05-14 DIAGNOSIS — I4892 Unspecified atrial flutter: Secondary | ICD-10-CM

## 2011-05-14 DIAGNOSIS — I4891 Unspecified atrial fibrillation: Secondary | ICD-10-CM

## 2011-05-14 LAB — POCT INR: INR: 2.3

## 2011-05-21 ENCOUNTER — Telehealth: Payer: Self-pay | Admitting: Internal Medicine

## 2011-05-21 NOTE — Telephone Encounter (Signed)
Entered in error

## 2011-06-05 ENCOUNTER — Other Ambulatory Visit: Payer: Self-pay

## 2011-06-05 MED ORDER — WARFARIN SODIUM 2.5 MG PO TABS: ORAL_TABLET | ORAL | Status: DC

## 2011-06-26 ENCOUNTER — Ambulatory Visit (INDEPENDENT_AMBULATORY_CARE_PROVIDER_SITE_OTHER): Payer: Self-pay | Admitting: *Deleted

## 2011-06-26 DIAGNOSIS — Z7901 Long term (current) use of anticoagulants: Secondary | ICD-10-CM

## 2011-06-26 DIAGNOSIS — I4892 Unspecified atrial flutter: Secondary | ICD-10-CM

## 2011-06-26 DIAGNOSIS — I4891 Unspecified atrial fibrillation: Secondary | ICD-10-CM

## 2011-06-26 LAB — POCT INR: INR: 2.2

## 2011-07-25 ENCOUNTER — Other Ambulatory Visit: Payer: Self-pay | Admitting: Cardiology

## 2011-07-25 DIAGNOSIS — I4891 Unspecified atrial fibrillation: Secondary | ICD-10-CM

## 2011-07-25 MED ORDER — NADOLOL 20 MG PO TABS: 20.0000 mg | ORAL_TABLET | Freq: Every day | ORAL | Status: DC

## 2011-07-28 ENCOUNTER — Other Ambulatory Visit: Payer: Self-pay

## 2011-07-28 MED ORDER — HYDROCODONE-ACETAMINOPHEN 5-325 MG PO TABS: 1.0000 | ORAL_TABLET | Freq: Two times a day (BID) | ORAL | Status: DC | PRN

## 2011-07-28 NOTE — Telephone Encounter (Signed)
Done hardcopy to robin  

## 2011-08-07 ENCOUNTER — Ambulatory Visit (INDEPENDENT_AMBULATORY_CARE_PROVIDER_SITE_OTHER): Payer: Self-pay | Admitting: *Deleted

## 2011-08-07 DIAGNOSIS — I4891 Unspecified atrial fibrillation: Secondary | ICD-10-CM

## 2011-08-07 DIAGNOSIS — Z7901 Long term (current) use of anticoagulants: Secondary | ICD-10-CM

## 2011-08-07 DIAGNOSIS — I4892 Unspecified atrial flutter: Secondary | ICD-10-CM

## 2011-08-07 LAB — POCT INR: INR: 2.1

## 2011-08-26 ENCOUNTER — Other Ambulatory Visit: Payer: Self-pay

## 2011-08-26 MED ORDER — TEMAZEPAM 30 MG PO CAPS: 30.0000 mg | ORAL_CAPSULE | Freq: Every evening | ORAL | Status: DC | PRN

## 2011-08-26 MED ORDER — ALPRAZOLAM 0.5 MG PO TABS: 0.5000 mg | ORAL_TABLET | Freq: Two times a day (BID) | ORAL | Status: DC | PRN

## 2011-08-26 NOTE — Telephone Encounter (Signed)
Done hardcopy to robin  

## 2011-08-26 NOTE — Telephone Encounter (Signed)
Faxed hardcopy to pharmacy. 

## 2011-09-10 ENCOUNTER — Telehealth: Payer: Self-pay | Admitting: Internal Medicine

## 2011-09-10 NOTE — Telephone Encounter (Signed)
New problem:  Patient calling C/O last Saturday night heart out of rhythm at work. Still not up to par.

## 2011-09-10 NOTE — Telephone Encounter (Signed)
lmom for patient to return my call 

## 2011-09-11 NOTE — Telephone Encounter (Signed)
Patient will be seen by Scott [name] on 09/23/11  Pt aware  Dr Ladona Ridgel here that day

## 2011-09-11 NOTE — Telephone Encounter (Signed)
Fu call °Pt returning your call  °

## 2011-09-11 NOTE — Telephone Encounter (Signed)
I have asked the patient

## 2011-09-11 NOTE — Telephone Encounter (Signed)
Spoke with patient.  Started Sat night.  Heart racing on and off.  He is a lot better but he is not breathing well and he gets tired really quick.

## 2011-09-23 ENCOUNTER — Ambulatory Visit (INDEPENDENT_AMBULATORY_CARE_PROVIDER_SITE_OTHER): Payer: Self-pay | Admitting: *Deleted

## 2011-09-23 ENCOUNTER — Encounter: Payer: Self-pay | Admitting: Nurse Practitioner

## 2011-09-23 ENCOUNTER — Ambulatory Visit (INDEPENDENT_AMBULATORY_CARE_PROVIDER_SITE_OTHER): Payer: Self-pay | Admitting: Nurse Practitioner

## 2011-09-23 VITALS — BP 120/72 | HR 56 | Ht 65.0 in | Wt 206.1 lb

## 2011-09-23 DIAGNOSIS — I4891 Unspecified atrial fibrillation: Secondary | ICD-10-CM

## 2011-09-23 DIAGNOSIS — Z7901 Long term (current) use of anticoagulants: Secondary | ICD-10-CM

## 2011-09-23 DIAGNOSIS — I10 Essential (primary) hypertension: Secondary | ICD-10-CM

## 2011-09-23 DIAGNOSIS — I4892 Unspecified atrial flutter: Secondary | ICD-10-CM

## 2011-09-23 DIAGNOSIS — Z79899 Other long term (current) drug therapy: Secondary | ICD-10-CM

## 2011-09-23 DIAGNOSIS — I509 Heart failure, unspecified: Secondary | ICD-10-CM

## 2011-09-23 DIAGNOSIS — IMO0002 Reserved for concepts with insufficient information to code with codable children: Secondary | ICD-10-CM

## 2011-09-23 LAB — CBC WITH DIFFERENTIAL/PLATELET
Basophils Absolute: 0 10*3/uL (ref 0.0–0.1)
Basophils Relative: 0.6 % (ref 0.0–3.0)
Eosinophils Absolute: 0.4 10*3/uL (ref 0.0–0.7)
Eosinophils Relative: 6 % — ABNORMAL HIGH (ref 0.0–5.0)
HCT: 42 % (ref 39.0–52.0)
Hemoglobin: 14.3 g/dL (ref 13.0–17.0)
Lymphocytes Relative: 27.1 % (ref 12.0–46.0)
Lymphs Abs: 1.8 10*3/uL (ref 0.7–4.0)
MCHC: 34 g/dL (ref 30.0–36.0)
MCV: 95.1 fl (ref 78.0–100.0)
Monocytes Absolute: 0.7 10*3/uL (ref 0.1–1.0)
Monocytes Relative: 10.1 % (ref 3.0–12.0)
Neutro Abs: 3.7 10*3/uL (ref 1.4–7.7)
Neutrophils Relative %: 56.2 % (ref 43.0–77.0)
Platelets: 149 10*3/uL — ABNORMAL LOW (ref 150.0–400.0)
RBC: 4.42 Mil/uL (ref 4.22–5.81)
RDW: 13.1 % (ref 11.5–14.6)
WBC: 6.5 10*3/uL (ref 4.5–10.5)

## 2011-09-23 LAB — BASIC METABOLIC PANEL
BUN: 22 mg/dL (ref 6–23)
CO2: 29 mEq/L (ref 19–32)
Calcium: 8.6 mg/dL (ref 8.4–10.5)
Chloride: 104 mEq/L (ref 96–112)
Creatinine, Ser: 1.3 mg/dL (ref 0.4–1.5)
GFR: 63.4 mL/min (ref >60.00)
Glucose, Bld: 88 mg/dL (ref 70–99)
Potassium: 3.6 mEq/L (ref 3.5–5.1)
Sodium: 141 mEq/L (ref 135–145)

## 2011-09-23 LAB — HEPATIC FUNCTION PANEL
ALT: 18 U/L (ref 0–53)
AST: 20 U/L (ref 0–37)
Albumin: 3.7 g/dL (ref 3.5–5.2)
Alkaline Phosphatase: 47 U/L (ref 39–117)
Bilirubin, Direct: 0.1 mg/dL (ref 0.0–0.3)
Total Bilirubin: 0.6 mg/dL (ref 0.3–1.2)
Total Protein: 6.8 g/dL (ref 6.0–8.3)

## 2011-09-23 LAB — POCT INR: INR: 3

## 2011-09-23 LAB — TSH: TSH: 2.54 u[IU]/mL (ref 0.35–5.50)

## 2011-09-23 MED ORDER — AMIODARONE HCL 200 MG PO TABS: ORAL_TABLET | ORAL | Status: DC

## 2011-09-23 NOTE — Assessment & Plan Note (Signed)
Blood pressure looks good today. 

## 2011-09-23 NOTE — Patient Instructions (Signed)
Increase your amiodarone to a whole tablet each day  We are going to check labs today  We are going to update your echocardiogram of your heart  We are going to place an event monitor for the next month  See Dr. Ladona Ridgel in early September  We will need to readjust your coumadin dose today  Stay on your other medicines.  Call the [facility] office at [phone] if you have any questions, problems or concerns.

## 2011-09-23 NOTE — Assessment & Plan Note (Signed)
Will update the echo.

## 2011-09-23 NOTE — Assessment & Plan Note (Signed)
Patient presents today with a 3 week history of palpitations and feels like he has been going in and out of rhythm. He does remain on his coumadin for anticoagulation. Currently in sinus rhythm today. Does not use caffeine or alcohol. Has had prior ablations in the past. No recent labs or echo. Remains on low dose amiodarone. I have ordered labs today, will update the echo and place an event monitor for the next month. I have also increased his amiodarone to a whole 200 mg tablet. He will need consideration for PFT's and CXR on return. Will arrange for him to see Dr. Ladona Ridgel in about 5 weeks which is about the time he was due for his regular follow up anyway. No other medicine changes today. Patient is agreeable to this plan and will call if any problems develop in the interim.

## 2011-09-23 NOTE — Assessment & Plan Note (Signed)
INR was 3.0 today. I did increase his amiodarone. Will send him back to coumadin for readjustment of his dose.

## 2011-09-23 NOTE — Progress Notes (Signed)
Scott [name] Date of Birth: [birth year] Medical Record #[id number]  History of Present Illness: [name] is seen today for a work in visit. He is seen for Dr. Ladona Ridgel. He has a history of atrial fib after prior flutter ablation and prior atrial fib ablation, a tachycardia induced CM, diastolic heart failure, on low dose amiodarone, COPD and obesity. He remains on anticoagulation. Last EF was 50 to 55% back in 2011 per echo. Last visit here was in September of 2012.   He comes in today. He is here alone. He complains of his heart racing on and off. He tires quickly. He remains short of breath. His breathing has been a chronic issue. He tells me that all this really started about 3 weeks ago. He had been doing fine until then. His heart has been going in and out of rhythm. He gets very lightheaded. No syncope. Some associated chest pain with the arrhythmia. It initially lasted for long periods of time but decreased over the course of the last 3 weeks. No spells since this past Sunday. No recent labs. Last PFT's were several years ago and no recent CXR or labs for follow up of his amiodarone. He is no longer on his Corgard due to expense.   Current Outpatient Prescriptions on File Prior to Visit  Medication Sig Dispense Refill  . albuterol (PROAIR HFA) 108 (90 BASE) MCG/ACT inhaler Inhale 2 puffs into the lungs 4 (four) times daily.       Marland Kitchen ALPRAZolam (XANAX) 0.5 MG tablet Take 1 tablet (0.5 mg total) by mouth 2 (two) times daily as needed for anxiety.  60 tablet  2  . amLODipine (NORVASC) 5 MG tablet TAKE ONE TABLET BY MOUTH ONE TIME DAILY  60 tablet  9  . budesonide-formoterol (SYMBICORT) 160-4.5 MCG/ACT inhaler Inhale 2 puffs into the lungs 2 (two) times daily.  3 Inhaler  3  . citalopram (CELEXA) 20 MG tablet TAKE ONE TABLET BY MOUTH ONE TIME DAILY  90 tablet  2  . enalapril (VASOTEC) 10 MG tablet Take 1 tablet (10 mg total) by mouth daily.  90 tablet  4  . hydrochlorothiazide (HYDRODIURIL) 25  MG tablet Take 1 tablet (25 mg total) by mouth daily.  90 tablet  11  . HYDROcodone-acetaminophen (NORCO) 5-325 MG per tablet Take 1 tablet by mouth 2 (two) times daily as needed.  60 tablet  2  . lovastatin (MEVACOR) 40 MG tablet Take two tablets by mouth once daily      . temazepam (RESTORIL) 30 MG capsule Take 1 capsule (30 mg total) by mouth at bedtime as needed.  30 capsule  5  . warfarin (COUMADIN) 2.5 MG tablet Follow Directions provided by physician  90 tablet  3  . DISCONTD: amiodarone (PACERONE) 200 MG tablet Take 1/2 tablet by mouth once daily        Allergies  Allergen Reactions  . Zolpidem Tartrate     REACTION: bad hangover    Past Medical History  Diagnosis Date  . HYPOTHYROIDISM, ACQUIRED NEC 09/28/2006  . HYPERLIPIDEMIA 09/28/2006  . ANXIETY 09/28/2006  . OBSTRUCTIVE SLEEP APNEA 01/06/2008  . HYPERTENSION 09/28/2006  . Atrial fibrillation 09/28/2006    s/p afib ablation x 2  . Diastolic dysfunction 09/28/2006  . ALLERGIC RHINITIS 01/14/2007  . CHRONIC OBSTRUCTIVE PULMONARY DISEASE, ACUTE EXACERBATION 10/04/2008  . Long term current use of anticoagulant 04/08/2010  . Right knee DJD 07/09/2010    Past Surgical History  Procedure Date  .  Hand surgury     left  . Radio frequency ablation for atrial flutter 2005    CTI  . Radio frequency ablation for atrial fibrillation 12/30/07 and 06/05/09    afib ablation x 2 by JA    History  Smoking status  . Former Smoker  Smokeless tobacco  . Not on file  Comment: not smoked over 10 years    History  Alcohol Use No    Family History  Problem Relation Age of Onset  . Dementia Father   . Hypertension Father     Review of Systems: The review of systems is per the HPI.  All other systems were reviewed and are negative.  Physical Exam: BP 120/72  Pulse 56  Ht 5\' 5"  (1.651 m)  Wt 206 lb 1.9 oz (93.495 kg)  BMI 34.30 kg/m2 Patient is very pleasant and in no acute distress. He is obese and he looks older than his stated  age. Skin is warm and dry. Color is normal.  HEENT is unremarkable. Normocephalic/atraumatic. PERRL. Sclera are nonicteric. Neck is supple. No masses. No JVD. Lungs are clear. Cardiac exam shows a regular rate and rhythm. Abdomen is obese but soft. Extremities are without edema. Gait and ROM are intact. No gross neurologic deficits noted.   LABORATORY DATA: EKG today shows sinus bradycardia with 1st degree AV block. QT is 440 with QTc of 424. PR is 234 ms  Lab Results  Component Value Date   WBC 7.3 07/05/2010   HGB 13.9 07/05/2010   HCT 41.0 07/05/2010   PLT 170.0 07/05/2010   GLUCOSE 98 07/05/2010   CHOL 164 07/05/2010   TRIG 49.0 07/05/2010   HDL 47.70 07/05/2010   LDLCALC 107* 07/05/2010   ALT 19 07/05/2010   AST 22 07/05/2010   NA 142 07/05/2010   K 3.8 07/05/2010   CL 105 07/05/2010   CREATININE 1.2 07/05/2010   BUN 18 07/05/2010   CO2 31 07/05/2010   TSH 3.78 07/05/2010   PSA 0.50 07/05/2010   INR 3.0 09/23/2011   Lab Results  Component Value Date   INR 3.0 09/23/2011   INR 2.1 08/07/2011   INR 2.2 06/26/2011     Assessment / Plan:

## 2011-09-24 ENCOUNTER — Telehealth: Payer: Self-pay | Admitting: *Deleted

## 2011-09-24 NOTE — Telephone Encounter (Signed)
Patient notified.  Vista Mink, CMA

## 2011-09-24 NOTE — Telephone Encounter (Signed)
Message copied by Awilda Bill on Wed Sep 24, 2011  1:47 PM ------      Message from: Rosalio Macadamia      Created: Tue Sep 23, 2011 12:57 PM       Ok to report. Labs are satisfactory.

## 2011-09-25 ENCOUNTER — Ambulatory Visit (HOSPITAL_COMMUNITY): Payer: Self-pay | Attending: Internal Medicine

## 2011-09-25 DIAGNOSIS — Z79899 Other long term (current) drug therapy: Secondary | ICD-10-CM

## 2011-09-25 DIAGNOSIS — IMO0002 Reserved for concepts with insufficient information to code with codable children: Secondary | ICD-10-CM

## 2011-09-25 DIAGNOSIS — J4489 Other specified chronic obstructive pulmonary disease: Secondary | ICD-10-CM | POA: Insufficient documentation

## 2011-09-25 DIAGNOSIS — I059 Rheumatic mitral valve disease, unspecified: Secondary | ICD-10-CM | POA: Insufficient documentation

## 2011-09-25 DIAGNOSIS — J449 Chronic obstructive pulmonary disease, unspecified: Secondary | ICD-10-CM | POA: Insufficient documentation

## 2011-09-25 DIAGNOSIS — I079 Rheumatic tricuspid valve disease, unspecified: Secondary | ICD-10-CM | POA: Insufficient documentation

## 2011-09-25 DIAGNOSIS — E785 Hyperlipidemia, unspecified: Secondary | ICD-10-CM | POA: Insufficient documentation

## 2011-09-25 DIAGNOSIS — I509 Heart failure, unspecified: Secondary | ICD-10-CM | POA: Insufficient documentation

## 2011-09-25 DIAGNOSIS — R0989 Other specified symptoms and signs involving the circulatory and respiratory systems: Secondary | ICD-10-CM | POA: Insufficient documentation

## 2011-09-25 DIAGNOSIS — R5381 Other malaise: Secondary | ICD-10-CM | POA: Insufficient documentation

## 2011-09-25 DIAGNOSIS — I379 Nonrheumatic pulmonary valve disorder, unspecified: Secondary | ICD-10-CM | POA: Insufficient documentation

## 2011-09-25 DIAGNOSIS — I319 Disease of pericardium, unspecified: Secondary | ICD-10-CM | POA: Insufficient documentation

## 2011-09-25 DIAGNOSIS — R0609 Other forms of dyspnea: Secondary | ICD-10-CM | POA: Insufficient documentation

## 2011-09-25 DIAGNOSIS — I10 Essential (primary) hypertension: Secondary | ICD-10-CM | POA: Insufficient documentation

## 2011-09-25 NOTE — Progress Notes (Signed)
Echocardiogram performed.  

## 2011-10-03 ENCOUNTER — Other Ambulatory Visit: Payer: Self-pay

## 2011-10-03 MED ORDER — TEMAZEPAM 30 MG PO CAPS: 30.0000 mg | ORAL_CAPSULE | Freq: Every evening | ORAL | Status: DC | PRN

## 2011-10-03 NOTE — Telephone Encounter (Signed)
Faxed hardcopy to pharmacy. 

## 2011-10-03 NOTE — Telephone Encounter (Signed)
Done hardcopy to robin  

## 2011-10-09 ENCOUNTER — Ambulatory Visit (INDEPENDENT_AMBULATORY_CARE_PROVIDER_SITE_OTHER): Payer: Self-pay | Admitting: Pharmacist

## 2011-10-09 DIAGNOSIS — I4892 Unspecified atrial flutter: Secondary | ICD-10-CM

## 2011-10-09 DIAGNOSIS — I4891 Unspecified atrial fibrillation: Secondary | ICD-10-CM

## 2011-10-09 DIAGNOSIS — Z7901 Long term (current) use of anticoagulants: Secondary | ICD-10-CM

## 2011-10-09 LAB — POCT INR: INR: 2.4

## 2011-10-22 ENCOUNTER — Encounter: Payer: Self-pay | Admitting: Internal Medicine

## 2011-10-22 ENCOUNTER — Ambulatory Visit (INDEPENDENT_AMBULATORY_CARE_PROVIDER_SITE_OTHER): Payer: Self-pay | Admitting: Internal Medicine

## 2011-10-22 VITALS — BP 130/64 | HR 63 | Resp 18 | Ht 66.0 in | Wt 201.8 lb

## 2011-10-22 DIAGNOSIS — I4891 Unspecified atrial fibrillation: Secondary | ICD-10-CM

## 2011-10-22 DIAGNOSIS — G4733 Obstructive sleep apnea (adult) (pediatric): Secondary | ICD-10-CM

## 2011-10-22 DIAGNOSIS — I10 Essential (primary) hypertension: Secondary | ICD-10-CM

## 2011-10-22 NOTE — Patient Instructions (Addendum)
Your physician recommends that you schedule a follow-up appointment in: 3 months with Dr Johney Frame   You have been referred to Dr Tressie Stalker for MAZE procedure

## 2011-10-27 ENCOUNTER — Encounter: Payer: Self-pay | Admitting: Internal Medicine

## 2011-10-27 NOTE — Progress Notes (Signed)
PCP: Oliver Barre, MD Primary EP:  Dr Ladona Ridgel  The patient presents today for routine electrophysiology followup.  He is well known to me and is s/p afib ablation twice.  Though his previously persistent afib is now paroxysmal and episodes are much less frequent, he feels that recently his afib burden is increasing.  This is reflected in his recent event monitor which confirms that he is having further afib despite medical therapy with amiodarone.  During afib, he reports fatigue and decreased exercise tolerance.  His SOB is stable.  Today, he denies symptoms of chest pain,orthopnea, PND, lower extremity edema, dizziness, presyncope, syncope, or neurologic sequela.  The patient feels that he is tolerating medications without difficulties and is otherwise without complaint today.   Past Medical History  Diagnosis Date  . HYPOTHYROIDISM, ACQUIRED NEC 09/28/2006  . HYPERLIPIDEMIA 09/28/2006  . ANXIETY 09/28/2006  . OBSTRUCTIVE SLEEP APNEA 01/06/2008  . HYPERTENSION 09/28/2006  . Atrial fibrillation 09/28/2006    s/p afib ablation x 2  . Diastolic dysfunction 09/28/2006  . ALLERGIC RHINITIS 01/14/2007  . CHRONIC OBSTRUCTIVE PULMONARY DISEASE, ACUTE EXACERBATION 10/04/2008  . Long term current use of anticoagulant 04/08/2010  . Right knee DJD 07/09/2010   Past Surgical History  Procedure Date  . Hand surgury     left  . Radiofrequency ablation for atrial flutter 2005    CTI  . Radiofrequency ablation for atrial fibrillation 12/30/07 and 06/05/09    afib ablation x 2 by JA    Current Outpatient Prescriptions  Medication Sig Dispense Refill  . albuterol (PROAIR HFA) 108 (90 BASE) MCG/ACT inhaler Inhale 2 puffs into the lungs 4 (four) times daily.       Marland Kitchen ALPRAZolam (XANAX) 0.5 MG tablet Take 1 tablet (0.5 mg total) by mouth 2 (two) times daily as needed for anxiety.  60 tablet  2  . amiodarone (PACERONE) 200 MG tablet Take 1/2 tablet by mouth once daily      . amLODipine (NORVASC) 5 MG tablet TAKE ONE TABLET  BY MOUTH ONE TIME DAILY  60 tablet  9  . budesonide-formoterol (SYMBICORT) 160-4.5 MCG/ACT inhaler Inhale 2 puffs into the lungs 2 (two) times daily.  3 Inhaler  3  . citalopram (CELEXA) 20 MG tablet TAKE ONE TABLET BY MOUTH ONE TIME DAILY  90 tablet  2  . enalapril (VASOTEC) 10 MG tablet Take 1 tablet (10 mg total) by mouth daily.  90 tablet  4  . hydrochlorothiazide (HYDRODIURIL) 25 MG tablet Take 1 tablet (25 mg total) by mouth daily.  90 tablet  11  . HYDROcodone-acetaminophen (NORCO) 5-325 MG per tablet Take 1 tablet by mouth 2 (two) times daily as needed.  60 tablet  2  . lovastatin (MEVACOR) 40 MG tablet Take two tablets by mouth once daily      . temazepam (RESTORIL) 30 MG capsule Take 1 capsule (30 mg total) by mouth at bedtime as needed.  30 capsule  1  . warfarin (COUMADIN) 2.5 MG tablet Follow Directions provided by physician  90 tablet  3    Allergies  Allergen Reactions  . Zolpidem Tartrate     REACTION: bad hangover    History   Social History  . Marital Status: Single    Spouse Name: N/A    Number of Children: N/A  . Years of Education: N/A   Occupational History  . worked previously in the [employer] and feels that he was exposed to asbestos.    Social History Main  Topics  . Smoking status: Former Games developer  . Smokeless tobacco: Not on file   Comment: not smoked over 10 years  . Alcohol Use: No  . Drug Use: No  . Sexually Active: Not Currently   Other Topics Concern  . Not on file   Social History Narrative   Pt lives in [place], [place] with his friend.     Family History  Problem Relation Age of Onset  . Dementia Father   . Hypertension Father     ROS-  All systems are reviewed and are negative except as outlined in the HPI above    Physical Exam: Filed Vitals:   10/22/11 1217  BP: 130/64  Pulse: 63  Resp: 18  Height: 5\' 6"  (1.676 m)  Weight: 201 lb 12.8 oz (91.536 kg)  SpO2: 96%    GEN- The patient is well appearing, alert and oriented x  3 today.   Head- normocephalic, atraumatic Eyes-  Sclera clear, conjunctiva pink Ears- hearing intact Oropharynx- clear Neck- supple, no JVP Lymph- no cervical lymphadenopathy Lungs- prolonged expiratory phase, normal work of breathing Heart- Regular rate and rhythm, no murmurs, rubs or gallops, PMI not laterally displaced GI- soft, NT, ND, + BS Extremities- no clubbing, cyanosis, or edema MS- no significant deformity or atrophy Skin- no rash or lesion Psych- euthymic mood, full affect Neuro- strength and sensation are intact  Assessment and Plan:

## 2011-10-27 NOTE — Assessment & Plan Note (Signed)
CPAP encouraged 

## 2011-10-27 NOTE — Assessment & Plan Note (Signed)
Stable No change required today  

## 2011-10-27 NOTE — Assessment & Plan Note (Signed)
Recurrent but now paroxysmal atrial fibrillation s/p ablation x 2.   I think that we have exhausted our medical options at this point.  I would be very reluctant to increase amiodarone further.  In addition, I do not think that further endovascular ablation is prudent.  I think that our options are to consider a surgical MAZE procedure or AV nodal ablation and pacemaker implantation. I had a long discussion with the patient today regarding these options.  We also discussed the convergent procedure.  At this time, he would prefer to be evaluated by Dr Scott [name] for consideration of a stand alone surgical MAZE operation.  I will defer to Dr Scott [name] as to whether this patient is best suited for an open procedure or a minimally invasive approach but would prefer which ever technique would be likely to yield sinus rhythm long term for Scott [name]. I have reviewed his echo from 8/13 which reveals no significant valvular disease.  I would anticipate that a coronary assessment may be necessary prior to surgery (if the patient and surgeon agree to proceed).  I will therefore defer this decision/ timing to Dr Scott [name].  I will see Scott [name] back in 3 months hopefully to follow-up on his progress s/p MAZE at that time but am happy to see him sooner if necessary.

## 2011-10-29 ENCOUNTER — Encounter: Payer: Self-pay | Admitting: Pharmacist

## 2011-10-31 ENCOUNTER — Institutional Professional Consult (permissible substitution) (INDEPENDENT_AMBULATORY_CARE_PROVIDER_SITE_OTHER): Payer: Self-pay | Admitting: Thoracic Surgery (Cardiothoracic Vascular Surgery)

## 2011-10-31 ENCOUNTER — Encounter: Payer: Self-pay | Admitting: Internal Medicine

## 2011-10-31 ENCOUNTER — Encounter: Payer: Self-pay | Admitting: Thoracic Surgery (Cardiothoracic Vascular Surgery)

## 2011-10-31 ENCOUNTER — Other Ambulatory Visit: Payer: Self-pay | Admitting: Thoracic Surgery (Cardiothoracic Vascular Surgery)

## 2011-10-31 VITALS — BP 118/77 | HR 77 | Resp 20 | Ht 66.0 in | Wt 201.0 lb

## 2011-10-31 DIAGNOSIS — I4891 Unspecified atrial fibrillation: Secondary | ICD-10-CM

## 2011-10-31 DIAGNOSIS — Z01818 Encounter for other preprocedural examination: Secondary | ICD-10-CM

## 2011-10-31 NOTE — Progress Notes (Signed)
[address]            [name] [address]          [phone]     CARDIOTHORACIC SURGERY CONSULTATION REPORT  Referring Provider is [name], MD PCP is Oliver Barre, MD  Chief Complaint  Patient presents with  . Atrial Fibrillation    Referral from Dr Johney Frame for eval on A-Fib and MAZE procedure    HPI:  Patient is a 57 year old single white male from [place] who works as a Conservation officer, nature at [employer].  He has a long history of recurrent paroxysmal atrial fibrillation for which she has been followed by Dr. Johney Frame. He first developed atrial flutter for which he underwent ablation by Dr. Ladona Ridgel in 2005.  Shortly after that he developed atrial fibrillation and he has been treated with amiodarone and Coumadin ever since. He underwent atrial fib ablation by Dr. Johney Frame in 2009 and again in 2011.  He states that he had some improvement in the symptoms of atrial fibrillation after his first ablation in 2009, but several months later his symptoms began to recur. He reports that he did not have much of any change in symptoms after his most recent ablation performed in 2011.  He was seen in followup recently by Dr. Johney Frame and noted to have symptoms suggestive of increasing frequency of paroxysmal atrial fibrillation with worsening exercise tolerance, increasing palpitations and fatigue, and progressive exertional shortness of breath.  The patient has now been referred to consider possible surgical maze procedure.  The patient complains of gradual decline in his exercise tolerance is overall decreased energy. He states that he occasionally has stabbing episodes of chest pain that seems to be worse following physical exertion. The patient has slow progression of exertional shortness of breath over the last several years. He has frequent palpitations and clearly knows when he has gone into atrial fibrillation. He has also been having some dizzy spells and he  notes that his resting pulse and blood pressure seems to run low when he is maintaining sinus rhythm. He denies resting chest pain and resting shortness of breath. He has not had syncope. He has not had any lower extremity edema.  Past Medical History  Diagnosis Date  . HYPOTHYROIDISM, ACQUIRED NEC 09/28/2006  . HYPERLIPIDEMIA 09/28/2006  . ANXIETY 09/28/2006  . OBSTRUCTIVE SLEEP APNEA 01/06/2008  . HYPERTENSION 09/28/2006  . Atrial fibrillation 09/28/2006    s/p afib ablation x 2  . Diastolic dysfunction 09/28/2006  . ALLERGIC RHINITIS 01/14/2007  . CHRONIC OBSTRUCTIVE PULMONARY DISEASE, ACUTE EXACERBATION 10/04/2008  . Long term current use of anticoagulant 04/08/2010  . Right knee DJD 07/09/2010  . Asbestos exposure     Past Surgical History  Procedure Date  . Hand surgury     left  . Radiofrequency ablation for atrial flutter 2005    CTI  . Radiofrequency ablation for atrial fibrillation 12/30/07 and 06/05/09    afib ablation x 2 by JA    Family History  Problem Relation Age of Onset  . Dementia Father   . Hypertension Father     Social History History  Substance Use Topics  . Smoking status: Former Games developer  . Smokeless tobacco: Not on file   Comment: not smoked over 10 years  . Alcohol Use: No    Current Outpatient Prescriptions  Medication Sig Dispense Refill  . albuterol (  PROAIR HFA) 108 (90 BASE) MCG/ACT inhaler Inhale 2 puffs into the lungs 4 (four) times daily.       Marland Kitchen ALPRAZolam (XANAX) 0.5 MG tablet Take 1 tablet (0.5 mg total) by mouth 2 (two) times daily as needed for anxiety.  60 tablet  2  . amiodarone (PACERONE) 200 MG tablet Take 1/2 tablet by mouth once daily      . amLODipine (NORVASC) 5 MG tablet TAKE ONE TABLET BY MOUTH ONE TIME DAILY  60 tablet  9  . budesonide-formoterol (SYMBICORT) 160-4.5 MCG/ACT inhaler Inhale 2 puffs into the lungs 2 (two) times daily.  3 Inhaler  3  . citalopram (CELEXA) 20 MG tablet TAKE ONE TABLET BY MOUTH ONE TIME DAILY  90 tablet  2    . enalapril (VASOTEC) 10 MG tablet Take 1 tablet (10 mg total) by mouth daily.  90 tablet  4  . hydrochlorothiazide (HYDRODIURIL) 25 MG tablet Take 1 tablet (25 mg total) by mouth daily.  90 tablet  11  . HYDROcodone-acetaminophen (NORCO) 5-325 MG per tablet Take 1 tablet by mouth 2 (two) times daily as needed.  60 tablet  2  . lovastatin (MEVACOR) 40 MG tablet Take two tablets by mouth once daily      . temazepam (RESTORIL) 30 MG capsule Take 1 capsule (30 mg total) by mouth at bedtime as needed.  30 capsule  1  . warfarin (COUMADIN) 2.5 MG tablet Follow Directions provided by physician  90 tablet  3    Allergies  Allergen Reactions  . Zolpidem Tartrate     REACTION: bad hangover      Review of Systems:   General:  normal appetite, decreased energy, no weight gain, no weight loss, no fever  Cardiac:  + chest pain with exertion, no chest pain at rest, +SOB with moderate exertion, no resting SOB, no PND, + orthopnea, + palpitations, + arrhythmia, + atrial fibrillation, no LE edema, + dizzy spells, no syncope  Respiratory:  + shortness of breath, no home oxygen, no productive cough, + dry cough, no bronchitis, no wheezing, no hemoptysis, no asthma, no pain with inspiration or cough, + sleep apnea, no CPAP at night  GI:   NO difficulty swallowing, no reflux, no frequent heartburn, no hiatal hernia, no abdominal pain, no constipation, no diarrhea, no hematochezia, no hematemesis, no melena  GU:   no dysuria,  no frequency, no urinary tract infection, no hematuria, no enlarged prostate, no kidney stones, no kidney disease  Vascular:  no pain suggestive of claudication, no pain in feet, + leg cramps, no varicose veins, no DVT, no non-healing foot ulcer  Neuro:   no stroke, no TIA's, no seizures, no headaches, no temporary blindness one eye,  no slurred speech, no peripheral neuropathy, no chronic pain, no instability of gait, no memory/cognitive dysfunction  Musculoskeletal: + arthritis, + joint  swelling, no myalgias, mild difficulty walking due to knees, normal mobility   Skin:   no rash, no itching, no skin infections, no pressure sores or ulcerations  Psych:   + anxiety, no depression, no nervousness, no unusual recent stress  Eyes:   no blurry vision, + floaters, no recent vision changes, + wears glasses or contacts  ENT:   no hearing loss, + loose or painful teeth, no dentures, last saw dentist many years ago  Hematologic:  + easy bruising, no abnormal bleeding, no clotting disorder, no frequent epistaxis, no complications with coumadin therapy  Endocrine:  no diabetes  Physical Exam:   BP 118/77  Pulse 77  Resp 20  Ht 5\' 6"  (1.676 m)  Wt 201 lb (91.173 kg)  BMI 32.44 kg/m2  SpO2 97%  General:    well-appearing  HEENT:  Unremarkable other than impressive facial hair  Neck:   no JVD, no bruits, no adenopathy   Chest:   clear to auscultation, symmetrical breath sounds, no wheezes, no rhonchi   CV:   RRR, no murmur   Abdomen:  soft, non-tender, no masses   Extremities:  warm, well-perfused, pulses palpable, no LE edema  Rectal/GU  Deferred  Neuro:   Grossly non-focal and symmetrical throughout  Skin:   Clean and dry, no rashes, no breakdown   Diagnostic Tests:  Transthoracic Echocardiography  Patient: Scott [name], Scott [name] MR #: [id number] Study Date: 09/25/2011 Gender: M Age: 40 Height: 165.1cm Weight: 93.4kg BSA: 77m^2 Pt. Status: Room:  Armond Hang, Sharlot Gowda REFERRING Lewayne Bunting ATTENDING Dietrich Pates PERFORMING [facility], [facility] ORDERING Tyrone Sage, Lori REFERRING [place], Lawson Fiscal SONOGRAPHER Philomena Course, RDCS cc:  ------------------------------------------------------------ LV EF: 55% - 60%  ------------------------------------------------------------ Indications: CHF - 428.0.  ------------------------------------------------------------ History: PMH: Ablation. Acquired from the patient and from the patient's chart. Fatigue and  dyspnea. Tachycardia. Paroxysmal Atrial fibrillation. Paroxysmal Atrial flutter. Cardiomyopathy. Congestive heart failure. Chronic obstructive pulmonary disease. Risk factors: Hypertension. Dyslipidemia.  ------------------------------------------------------------ Study Conclusions  - Left ventricle: The cavity size was normal. Wall thickness was increased in a pattern of mild LVH. Systolic function was normal. The estimated ejection fraction was in the range of 55% to 60%. Wall motion was normal; there were no regional wall motion abnormalities. Doppler parameters are consistent with abnormal left ventricular relaxation (grade 1 diastolic dysfunction). - Pericardium, extracardiac: A trivial pericardial effusion was identified. Transthoracic echocardiography. M-mode, complete 2D, spectral Doppler, and color Doppler. Height: Height: 165.1cm. Height: 65in. Weight: Weight: 93.4kg. Weight: 205.6lb. Body mass index: BMI: 34.3kg/m^2. Body surface area: BSA: 35m^2. Blood pressure: 120/72. Patient status: Outpatient. Location: [place] [facility]  ------------------------------------------------------------  ------------------------------------------------------------ Left ventricle: The cavity size was normal. Wall thickness was increased in a pattern of mild LVH. Systolic function was normal. The estimated ejection fraction was in the range of 55% to 60%. Wall motion was normal; there were no regional wall motion abnormalities. Doppler parameters are consistent with abnormal left ventricular relaxation (grade 1 diastolic dysfunction).  ------------------------------------------------------------ Aortic valve: Trileaflet; normal thickness leaflets. Mobility was not restricted. Doppler: Transvalvular velocity was within the normal range. There was no stenosis. No regurgitation.  ------------------------------------------------------------ Aorta: Aortic root: The aortic root was  normal in size.  ------------------------------------------------------------ Mitral valve: Structurally normal valve. Mobility was not restricted. Doppler: Transvalvular velocity was within the normal range. There was no evidence for stenosis. Trivial regurgitation.  ------------------------------------------------------------ Left atrium: The atrium was normal in size.  ------------------------------------------------------------ Right ventricle: The cavity size was normal. Systolic function was normal.  ------------------------------------------------------------ Pulmonic valve: Doppler: Transvalvular velocity was within the normal range. There was no evidence for stenosis. Trivial regurgitation.  ------------------------------------------------------------ Tricuspid valve: Structurally normal valve. Doppler: Transvalvular velocity was within the normal range. Trivial regurgitation.  ------------------------------------------------------------ Pulmonary artery: Systolic pressure was within the normal range.  ------------------------------------------------------------ Right atrium: The atrium was normal in size.  ------------------------------------------------------------ Pericardium: A trivial pericardial effusion was identified.  ------------------------------------------------------------ Systemic veins: Inferior vena cava: The vessel was normal in size.  ------------------------------------------------------------  2D measurements Normal Doppler measurements Norma Left ventricle l LVID ED, 43.8 mm 43-52 Main pulmonary artery chord, Pressure, 17 mm Hg =30 PLAX S  LVID ES, 29.1 mm 23-38 Left ventricle chord, Ea, lat 11. cm/s ----- PLAX ann, tiss 6 FS, chord, 34 % >29 DP PLAX E/Ea, lat 5.3 ----- LVPW, ED 12.8 mm ------ ann, tiss 2 IVS/LVPW 1.06 <1.3 DP ratio, ED LVOT Ventricular septum Peak vel, 101 cm/s ----- IVS, ED 13.6 mm ------ S LVOT VTI, S 20. cm  ----- Diam, S 23 mm ------ 4 Area 4.15 cm^2 ------ HR 58 bpm ----- Diam 23 mm ------ Stroke vol 84. ml ----- Aorta 8 Root diam, 33 mm ------ Cardiac 4.9 L/min ----- ED output Left atrium Cardiac 2.5 L/(min-m ----- AP dim 39 mm ------ index ^2) AP dim 1.95 cm/m^2 <2.2 Stroke 42. ml/m^2 ----- index index 4 Mitral valve Peak E vel 61. cm/s ----- 7 Peak A vel 71. cm/s ----- 1 Decelerati 194 ms 150-2 on time 30 Peak E/A 0.9 ----- ratio Tricuspid valve Regurg 176 cm/s ----- peak vel Peak RV-RA 12 mm Hg ----- gradient, S Systemic veins Estimated 5 mm Hg ----- CVP Right ventricle Pressure, 17 mm Hg <30 S Sa vel, 9.0 cm/s ----- lat ann, 7 tiss DP Pulmonic valve Peak vel, 79 cm/s ----- S  ------------------------------------------------------------ Prepared and Electronically Authenticated by  Olga Millers 2013-08-01T16:10:[id number]    Impression:  The patient has several year history of recurrent paroxysmal atrial fibrillation and has failed medical therapy with amiodarone and 2 previous attempts at A. Fib ablation. 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  Echocardiogram demonstrates normal left ventricular systolic function and left atrial size, and is notable for the absence of significant valvular heart disease.  He has troubling symptoms of recurrent palpitations with exertional shortness of breath, fatigue, and chest pain. I am particularly concerned by the patient's continued gradual decline with worsening exertional shortness of breath and intermittent chest pain.   He did not have any known history of coronary artery disease although he has never had cardiac catheterization performed.   Plan:  I discussed matters at length with the patient here in the office today. We discussed a  variety of different long-term treatment options with regards to his underlying atrial fibrillation. Potential risks and benefits associated with Maze procedure were discussed at length and all of his questions been addressed. He was to think matters over further but he seems interested in considering an aggressive approach. He understands that he would need cardiac catheterization performed to rule out the presence of significant coronary artery disease prior to making final plans for surgery. In the absence of significant coronary artery disease we could consider minimally invasive approach for maze procedure.  Conventional sternotomy would be required if coronary revascularization is necessary. Because the patient lives alone, he wants some time to consider options regarding his convalescence following surgery before making any final plans. We will obtain pulmonary function tests in the meantime and see him back in 2 weeks to further discuss options and possibly schedule elective catheterization.  All of his questions have been addressed.    [name]. Cornelius Moras, MD 10/31/2011 2:00 PM

## 2011-11-03 ENCOUNTER — Other Ambulatory Visit: Payer: Self-pay

## 2011-11-03 DIAGNOSIS — I4891 Unspecified atrial fibrillation: Secondary | ICD-10-CM

## 2011-11-03 MED ORDER — LOVASTATIN 40 MG PO TABS: ORAL_TABLET | ORAL | Status: DC

## 2011-11-06 ENCOUNTER — Ambulatory Visit (HOSPITAL_COMMUNITY)
Admission: RE | Admit: 2011-11-06 | Discharge: 2011-11-06 | Disposition: A | Discharge status: Home / Self Care | Payer: Self-pay | Source: Ambulatory Visit | Attending: Thoracic Surgery (Cardiothoracic Vascular Surgery) | Admitting: Thoracic Surgery (Cardiothoracic Vascular Surgery)

## 2011-11-06 DIAGNOSIS — J988 Other specified respiratory disorders: Secondary | ICD-10-CM | POA: Insufficient documentation

## 2011-11-06 DIAGNOSIS — Z01811 Encounter for preprocedural respiratory examination: Secondary | ICD-10-CM | POA: Insufficient documentation

## 2011-11-06 DIAGNOSIS — R059 Cough, unspecified: Secondary | ICD-10-CM | POA: Insufficient documentation

## 2011-11-06 DIAGNOSIS — Z79899 Other long term (current) drug therapy: Secondary | ICD-10-CM | POA: Insufficient documentation

## 2011-11-06 DIAGNOSIS — R0609 Other forms of dyspnea: Secondary | ICD-10-CM | POA: Insufficient documentation

## 2011-11-06 DIAGNOSIS — Z01818 Encounter for other preprocedural examination: Secondary | ICD-10-CM

## 2011-11-06 DIAGNOSIS — R05 Cough: Secondary | ICD-10-CM | POA: Insufficient documentation

## 2011-11-06 DIAGNOSIS — I4891 Unspecified atrial fibrillation: Secondary | ICD-10-CM

## 2011-11-06 DIAGNOSIS — R0989 Other specified symptoms and signs involving the circulatory and respiratory systems: Secondary | ICD-10-CM | POA: Insufficient documentation

## 2011-11-06 LAB — PULMONARY FUNCTION TEST

## 2011-11-06 MED ORDER — ALBUTEROL SULFATE (5 MG/ML) 0.5% IN NEBU
2.5000 mg | INHALATION_SOLUTION | Freq: Once | RESPIRATORY_TRACT | Status: AC
  Administered 2011-11-06: 2.5 mg via RESPIRATORY_TRACT

## 2011-11-17 ENCOUNTER — Encounter: Payer: Self-pay | Admitting: Thoracic Surgery (Cardiothoracic Vascular Surgery)

## 2011-11-17 ENCOUNTER — Ambulatory Visit (INDEPENDENT_AMBULATORY_CARE_PROVIDER_SITE_OTHER): Payer: Self-pay | Admitting: Thoracic Surgery (Cardiothoracic Vascular Surgery)

## 2011-11-17 VITALS — BP 138/84 | HR 60 | Resp 18 | Ht 66.0 in | Wt 195.0 lb

## 2011-11-17 DIAGNOSIS — I4891 Unspecified atrial fibrillation: Secondary | ICD-10-CM

## 2011-11-17 NOTE — Patient Instructions (Addendum)
Stop taking coumadin after taking your regular dose on Friday September 27th in anticipation of cardiac catheterization on Monday September 30th.  Shave your entire beard off in anticipation of surgery.

## 2011-11-17 NOTE — Progress Notes (Signed)
[address]            [name] [address]          [phone]     CARDIOTHORACIC SURGERY OFFICE NOTE  Referring Provider is [name], MD PCP is Oliver Barre, MD   HPI:  Patient returns for followup of recurrent paroxysmal atrial fibrillation. He was originally seen in the office 10/31/2011.  He states that this past weekend he essentially spent the entire weekend in atrial fibrillation and felt very poorly with palpitations, shortness of breath, and chest discomfort. He wants to proceed with surgery in the near future. Remainder of his review of systems is unchanged from previously.   Current Outpatient Prescriptions  Medication Sig Dispense Refill  . albuterol (PROAIR HFA) 108 (90 BASE) MCG/ACT inhaler Inhale 2 puffs into the lungs 4 (four) times daily.       Marland Kitchen ALPRAZolam (XANAX) 0.5 MG tablet Take 1 tablet (0.5 mg total) by mouth 2 (two) times daily as needed for anxiety.  60 tablet  2  . amiodarone (PACERONE) 200 MG tablet Take 1/2 tablet by mouth once daily      . amLODipine (NORVASC) 5 MG tablet TAKE ONE TABLET BY MOUTH ONE TIME DAILY  60 tablet  9  . budesonide-formoterol (SYMBICORT) 160-4.5 MCG/ACT inhaler Inhale 2 puffs into the lungs 2 (two) times daily.  3 Inhaler  3  . citalopram (CELEXA) 20 MG tablet TAKE ONE TABLET BY MOUTH ONE TIME DAILY  90 tablet  2  . enalapril (VASOTEC) 10 MG tablet Take 1 tablet (10 mg total) by mouth daily.  90 tablet  4  . hydrochlorothiazide (HYDRODIURIL) 25 MG tablet Take 1 tablet (25 mg total) by mouth daily.  90 tablet  11  . HYDROcodone-acetaminophen (NORCO) 5-325 MG per tablet Take 1 tablet by mouth 2 (two) times daily as needed.  60 tablet  2  . lovastatin (MEVACOR) 40 MG tablet Take two tablets by mouth once daily  180 tablet  0  . temazepam (RESTORIL) 30 MG capsule Take 1 capsule (30 mg total) by mouth at bedtime as needed.  30 capsule  1  . warfarin (COUMADIN) 2.5 MG tablet Follow Directions provided  by physician  90 tablet  3      Physical Exam:   BP 138/84  Pulse 60  Resp 18  Ht 5\' 6"  (1.676 m)  Wt 195 lb (88.451 kg)  BMI 31.47 kg/m2  SpO2 98%  General:  Well-appearing  Chest:   Clear to auscultation  CV:   Regular rate and rhythm  Incisions:  n/a  Abdomen:  Soft and nontender  Extremities:  Warm and well-perfused  Diagnostic Tests:  Pulmonary Function Tests  Baseline      Post-bronchodilator  FVC  4.34 L  (102% predicted) FVC  4.27 L  (101% predicted) FEV1  3.07 L  (95% predicted) FEV1  3.22 L  (99% predicted) FEF25-75 2.16 L  (77% predicted) FEF25-75 2.61 L  (94% predicted)  RV  1.12 L  (57% predicted) DLCO  67% predicted    Impression:  Severe symptomatic recurrent paroxysmal atrial fibrillation which has failed medical therapy and 2 previous attempts at Afib ablation. The patient's pulmonary function test looked remarkably good despite his history of long-standing tobacco abuse, obstructive sleep apnea, asbestos exposure, and other possible work-related exposure to dusts and other inhaled toxins.  Plan:  We again discussed treatment alternatives and the patient is very eager to proceed with attempted surgical ablation for treatment of his atrial fibrillation. He will need cardiac catheterization to rule out the possibility of significant coronary artery disease. In the absence of significant coronary artery disease he may be candidate for minimally invasive approach for surgery. As a result we would prefer that catheterization be performed via radial artery approach or from the left groin and we would also request that imaging of the descending thoracic and abdominal aorta be performed to rule out the possibility of significant aortoiliac occlusive disease. The patient has been instructed to stop taking Coumadin after he takes his dose this coming Friday in anticipation of catheterization on Monday, September 30. We will tentatively plan for surgery Wednesday,  October 2.     [name]. Cornelius Moras, MD 11/17/2011 3:54 PM

## 2011-11-18 ENCOUNTER — Encounter (HOSPITAL_COMMUNITY): Payer: Self-pay | Admitting: Pharmacy Technician

## 2011-11-18 ENCOUNTER — Other Ambulatory Visit: Payer: Self-pay | Admitting: *Deleted

## 2011-11-18 DIAGNOSIS — I4891 Unspecified atrial fibrillation: Secondary | ICD-10-CM

## 2011-11-20 ENCOUNTER — Telehealth: Payer: Self-pay | Admitting: *Deleted

## 2011-11-20 NOTE — Telephone Encounter (Signed)
Message copied by Deliah Boston on Thu Nov 20, 2011  4:52 PM ------      Message from: Dewain Penning      Created: Tue Nov 18, 2011  9:55 AM      Regarding: cath        Tresa Endo & Fayrene Fearing,            Dr Cornelius Moras saw [name] in the office yesterday for consideration of MAZE. He called this am & ask me to schedule pt for cath on Monday Sept 30 th. I scheduled it for Dr [place] for 7:30 am. Pt will be admitted post cath for surgery with Dr Cornelius Moras on Wednesday Oct 1st.  Dr Cornelius Moras instructed the pt to stop his coumadin on Friday & we will check his inr on arrival Monday morning. Can you guys please contact the pt and let him know time and instructions for cath?            Thanks so much!!!            Trisha

## 2011-11-20 NOTE — Telephone Encounter (Signed)
Patient aware to be at hospital at 5:30am on Monday and will stop coumadin on Fri.  NPO after MN

## 2011-11-21 ENCOUNTER — Other Ambulatory Visit: Payer: Self-pay | Admitting: *Deleted

## 2011-11-21 DIAGNOSIS — R079 Chest pain, unspecified: Secondary | ICD-10-CM

## 2011-11-21 DIAGNOSIS — I4891 Unspecified atrial fibrillation: Secondary | ICD-10-CM

## 2011-11-21 DIAGNOSIS — I509 Heart failure, unspecified: Secondary | ICD-10-CM

## 2011-11-23 MED ORDER — SODIUM CHLORIDE 0.9 % IJ SOLN: 3.0000 mL | INTRAMUSCULAR | Status: DC | PRN

## 2011-11-23 MED ORDER — MAGNESIUM SULFATE 50 % IJ SOLN
40.0000 meq | INTRAMUSCULAR | Status: DC
  Filled 2011-11-23: qty 10

## 2011-11-23 MED ORDER — NITROGLYCERIN IN D5W 200-5 MCG/ML-% IV SOLN: 2.0000 ug/min | INTRAVENOUS | Status: DC

## 2011-11-23 MED ORDER — POTASSIUM CHLORIDE 2 MEQ/ML IV SOLN
80.0000 meq | INTRAVENOUS | Status: DC
  Filled 2011-11-23: qty 40

## 2011-11-23 MED ORDER — ASPIRIN 81 MG PO CHEW
324.0000 mg | CHEWABLE_TABLET | ORAL | Status: AC
  Administered 2011-11-24: 324 mg via ORAL

## 2011-11-23 MED ORDER — DOPAMINE-DEXTROSE 3.2-5 MG/ML-% IV SOLN: 2.0000 ug/kg/min | INTRAVENOUS | Status: DC

## 2011-11-23 MED ORDER — SODIUM CHLORIDE 0.9 % IV SOLN
INTRAVENOUS | Status: DC
  Filled 2011-11-23: qty 40

## 2011-11-23 MED ORDER — DEXMEDETOMIDINE HCL IN NACL 400 MCG/100ML IV SOLN
0.1000 ug/kg/h | INTRAVENOUS | Status: DC
  Filled 2011-11-23: qty 100

## 2011-11-23 MED ORDER — DEXTROSE 5 % IV SOLN
1.5000 g | INTRAVENOUS | Status: DC
  Filled 2011-11-23: qty 1.5

## 2011-11-23 MED ORDER — METOPROLOL TARTRATE 12.5 MG HALF TABLET
12.5000 mg | ORAL_TABLET | Freq: Once | ORAL | Status: DC
Start: 2011-11-23 — End: 2011-11-23
  Filled 2011-11-23: qty 1

## 2011-11-23 MED ORDER — PLASMA-LYTE 148 IV SOLN
INTRAVENOUS | Status: DC
  Filled 2011-11-23: qty 2.5

## 2011-11-23 MED ORDER — DEXTROSE 5 % IV SOLN
750.0000 mg | INTRAVENOUS | Status: DC
  Filled 2011-11-23: qty 750

## 2011-11-23 MED ORDER — CHLORHEXIDINE GLUCONATE 4 % EX LIQD
30.0000 mL | CUTANEOUS | Status: DC
  Filled 2011-11-23: qty 30

## 2011-11-23 MED ORDER — SODIUM CHLORIDE 0.9 % IV SOLN: 250.0000 mL | INTRAVENOUS | Status: DC | PRN

## 2011-11-23 MED ORDER — PHENYLEPHRINE HCL 10 MG/ML IJ SOLN
30.0000 ug/min | INTRAVENOUS | Status: DC
  Filled 2011-11-23: qty 2

## 2011-11-23 MED ORDER — SODIUM CHLORIDE 0.9 % IV SOLN
INTRAVENOUS | Status: DC
  Filled 2011-11-23: qty 1

## 2011-11-23 MED ORDER — EPINEPHRINE HCL 1 MG/ML IJ SOLN
0.5000 ug/min | INTRAVENOUS | Status: DC
  Filled 2011-11-23: qty 4

## 2011-11-23 MED ORDER — SODIUM CHLORIDE 0.9 % IJ SOLN: 3.0000 mL | Freq: Two times a day (BID) | INTRAMUSCULAR | Status: DC

## 2011-11-23 MED ORDER — VANCOMYCIN HCL 1000 MG IV SOLR
1250.0000 mg | INTRAVENOUS | Status: DC
  Filled 2011-11-23: qty 1250

## 2011-11-24 ENCOUNTER — Encounter (HOSPITAL_COMMUNITY): Payer: Self-pay | Admitting: Physician Assistant

## 2011-11-24 ENCOUNTER — Inpatient Hospital Stay (HOSPITAL_COMMUNITY)
Admission: RE | Admit: 2011-11-24 | Discharge: 2011-12-03 | DRG: 229 | Disposition: A | Discharge status: Home / Self Care | Payer: Medicaid Other | Source: Ambulatory Visit | Attending: Thoracic Surgery (Cardiothoracic Vascular Surgery) | Admitting: Thoracic Surgery (Cardiothoracic Vascular Surgery)

## 2011-11-24 ENCOUNTER — Ambulatory Visit: Payer: Self-pay | Admitting: Internal Medicine

## 2011-11-24 ENCOUNTER — Other Ambulatory Visit (HOSPITAL_COMMUNITY): Payer: Self-pay

## 2011-11-24 ENCOUNTER — Encounter (HOSPITAL_COMMUNITY)
Admission: RE | Disposition: A | Discharge status: Home / Self Care | Payer: Self-pay | Source: Ambulatory Visit | Attending: Thoracic Surgery (Cardiothoracic Vascular Surgery)

## 2011-11-24 DIAGNOSIS — D696 Thrombocytopenia, unspecified: Secondary | ICD-10-CM | POA: Diagnosis not present

## 2011-11-24 DIAGNOSIS — Z77090 Contact with and (suspected) exposure to asbestos: Secondary | ICD-10-CM

## 2011-11-24 DIAGNOSIS — I480 Paroxysmal atrial fibrillation: Secondary | ICD-10-CM | POA: Diagnosis present

## 2011-11-24 DIAGNOSIS — I739 Peripheral vascular disease, unspecified: Secondary | ICD-10-CM

## 2011-11-24 DIAGNOSIS — I4891 Unspecified atrial fibrillation: Secondary | ICD-10-CM

## 2011-11-24 DIAGNOSIS — Z9889 Other specified postprocedural states: Secondary | ICD-10-CM

## 2011-11-24 DIAGNOSIS — Z8249 Family history of ischemic heart disease and other diseases of the circulatory system: Secondary | ICD-10-CM

## 2011-11-24 DIAGNOSIS — J4489 Other specified chronic obstructive pulmonary disease: Secondary | ICD-10-CM | POA: Diagnosis present

## 2011-11-24 DIAGNOSIS — I2510 Atherosclerotic heart disease of native coronary artery without angina pectoris: Principal | ICD-10-CM | POA: Diagnosis present

## 2011-11-24 DIAGNOSIS — Z823 Family history of stroke: Secondary | ICD-10-CM

## 2011-11-24 DIAGNOSIS — J90 Pleural effusion, not elsewhere classified: Secondary | ICD-10-CM | POA: Diagnosis not present

## 2011-11-24 DIAGNOSIS — I10 Essential (primary) hypertension: Secondary | ICD-10-CM | POA: Diagnosis present

## 2011-11-24 DIAGNOSIS — I5032 Chronic diastolic (congestive) heart failure: Secondary | ICD-10-CM | POA: Diagnosis present

## 2011-11-24 DIAGNOSIS — D62 Acute posthemorrhagic anemia: Secondary | ICD-10-CM | POA: Diagnosis not present

## 2011-11-24 DIAGNOSIS — Z8679 Personal history of other diseases of the circulatory system: Secondary | ICD-10-CM

## 2011-11-24 DIAGNOSIS — I509 Heart failure, unspecified: Secondary | ICD-10-CM

## 2011-11-24 DIAGNOSIS — Z87891 Personal history of nicotine dependence: Secondary | ICD-10-CM

## 2011-11-24 DIAGNOSIS — E039 Hypothyroidism, unspecified: Secondary | ICD-10-CM | POA: Diagnosis present

## 2011-11-24 DIAGNOSIS — Z7901 Long term (current) use of anticoagulants: Secondary | ICD-10-CM

## 2011-11-24 DIAGNOSIS — I5042 Chronic combined systolic (congestive) and diastolic (congestive) heart failure: Secondary | ICD-10-CM | POA: Diagnosis present

## 2011-11-24 DIAGNOSIS — Z951 Presence of aortocoronary bypass graft: Secondary | ICD-10-CM

## 2011-11-24 DIAGNOSIS — E785 Hyperlipidemia, unspecified: Secondary | ICD-10-CM | POA: Diagnosis present

## 2011-11-24 DIAGNOSIS — I495 Sick sinus syndrome: Secondary | ICD-10-CM

## 2011-11-24 DIAGNOSIS — G4733 Obstructive sleep apnea (adult) (pediatric): Secondary | ICD-10-CM | POA: Diagnosis present

## 2011-11-24 DIAGNOSIS — R079 Chest pain, unspecified: Secondary | ICD-10-CM

## 2011-11-24 DIAGNOSIS — M1710 Unilateral primary osteoarthritis, unspecified knee: Secondary | ICD-10-CM | POA: Diagnosis present

## 2011-11-24 DIAGNOSIS — J449 Chronic obstructive pulmonary disease, unspecified: Secondary | ICD-10-CM | POA: Diagnosis present

## 2011-11-24 DIAGNOSIS — I498 Other specified cardiac arrhythmias: Secondary | ICD-10-CM | POA: Diagnosis not present

## 2011-11-24 HISTORY — DX: Atherosclerotic heart disease of native coronary artery without angina pectoris: I25.10

## 2011-11-24 HISTORY — DX: Personal history of other diseases of the circulatory system: Z86.79

## 2011-11-24 HISTORY — DX: Other specified postprocedural states: Z98.890

## 2011-11-24 HISTORY — PX: LEFT HEART CATHETERIZATION WITH CORONARY ANGIOGRAM: SHX5451

## 2011-11-24 HISTORY — DX: Presence of aortocoronary bypass graft: Z95.1

## 2011-11-24 LAB — COMPREHENSIVE METABOLIC PANEL
ALT: 34 U/L (ref 0–53)
AST: 26 U/L (ref 0–37)
Albumin: 4.1 g/dL (ref 3.5–5.2)
Alkaline Phosphatase: 64 U/L (ref 39–117)
Chloride: 103 mEq/L (ref 96–112)
Potassium: 4.2 mEq/L (ref 3.5–5.1)
Sodium: 141 mEq/L (ref 135–145)
Total Bilirubin: 0.6 mg/dL (ref 0.3–1.2)
Total Protein: 7.8 g/dL (ref 6.0–8.3)

## 2011-11-24 LAB — TYPE AND SCREEN
ABO/RH(D): B NEG
Antibody Screen: NEGATIVE

## 2011-11-24 LAB — PROTIME-INR
INR: 1.34 (ref 0.00–1.49)
Prothrombin Time: 18.3 seconds — ABNORMAL HIGH (ref 11.6–15.2)

## 2011-11-24 LAB — BASIC METABOLIC PANEL
Chloride: 107 mEq/L (ref 96–112)
Creatinine, Ser: 1.17 mg/dL (ref 0.50–1.35)
GFR calc Af Amer: 79 mL/min — ABNORMAL LOW (ref >90)
GFR calc non Af Amer: 68 mL/min — ABNORMAL LOW (ref >90)

## 2011-11-24 LAB — ABO/RH: ABO/RH(D): B NEG

## 2011-11-24 LAB — CBC
MCHC: 35.1 g/dL (ref 30.0–36.0)
MCV: 92.2 fL (ref 78.0–100.0)
Platelets: 166 10*3/uL (ref 150–400)
RDW: 12.9 % (ref 11.5–15.5)
WBC: 6.3 10*3/uL (ref 4.0–10.5)

## 2011-11-24 LAB — APTT: aPTT: 34 seconds (ref 24–37)

## 2011-11-24 LAB — HEPARIN LEVEL (UNFRACTIONATED): Heparin Unfractionated: 0.16 IU/mL — ABNORMAL LOW (ref 0.30–0.70)

## 2011-11-24 LAB — TROPONIN I: Troponin I: <0.3 ng/mL (ref <0.30)

## 2011-11-24 SURGERY — LEFT HEART CATHETERIZATION WITH CORONARY ANGIOGRAM
Anesthesia: LOCAL

## 2011-11-24 MED ORDER — FENTANYL CITRATE 0.05 MG/ML IJ SOLN
INTRAMUSCULAR | Status: AC
  Filled 2011-11-24: qty 2

## 2011-11-24 MED ORDER — SODIUM CHLORIDE 0.9 % IV SOLN: 250.0000 mL | INTRAVENOUS | Status: DC | PRN

## 2011-11-24 MED ORDER — ASPIRIN EC 81 MG PO TBEC
81.0000 mg | DELAYED_RELEASE_TABLET | Freq: Every day | ORAL | Status: DC
Start: 2011-11-25 — End: 2011-11-26
  Administered 2011-11-25: 81 mg via ORAL
  Filled 2011-11-24 (×2): qty 1

## 2011-11-24 MED ORDER — ALBUTEROL SULFATE HFA 108 (90 BASE) MCG/ACT IN AERS
2.0000 | INHALATION_SPRAY | RESPIRATORY_TRACT | Status: DC | PRN
  Filled 2011-11-24: qty 6.7

## 2011-11-24 MED ORDER — ONDANSETRON HCL 4 MG/2ML IJ SOLN: 4.0000 mg | Freq: Four times a day (QID) | INTRAMUSCULAR | Status: DC | PRN

## 2011-11-24 MED ORDER — ASPIRIN 81 MG PO CHEW
CHEWABLE_TABLET | ORAL | Status: AC
  Filled 2011-11-24: qty 4

## 2011-11-24 MED ORDER — AMLODIPINE BESYLATE 5 MG PO TABS
5.0000 mg | ORAL_TABLET | Freq: Every day | ORAL | Status: DC
  Administered 2011-11-24 – 2011-11-25 (×2): 5 mg via ORAL
  Filled 2011-11-24 (×3): qty 1

## 2011-11-24 MED ORDER — SIMVASTATIN 40 MG PO TABS
40.0000 mg | ORAL_TABLET | Freq: Every day | ORAL | Status: DC
  Administered 2011-11-24: 40 mg via ORAL
  Filled 2011-11-24 (×2): qty 1

## 2011-11-24 MED ORDER — ENALAPRIL MALEATE 10 MG PO TABS
10.0000 mg | ORAL_TABLET | Freq: Every day | ORAL | Status: DC
  Administered 2011-11-24 – 2011-11-25 (×2): 10 mg via ORAL
  Filled 2011-11-24 (×3): qty 1

## 2011-11-24 MED ORDER — VERAPAMIL HCL 2.5 MG/ML IV SOLN
INTRAVENOUS | Status: AC
  Filled 2011-11-24: qty 2

## 2011-11-24 MED ORDER — SODIUM CHLORIDE 0.9 % IJ SOLN: 3.0000 mL | INTRAMUSCULAR | Status: DC | PRN

## 2011-11-24 MED ORDER — HEPARIN (PORCINE) IN NACL 100-0.45 UNIT/ML-% IJ SOLN
1300.0000 [IU]/h | INTRAMUSCULAR | Status: DC
  Administered 2011-11-24: 1000 [IU]/h via INTRAVENOUS
  Administered 2011-11-25: 1300 [IU]/h via INTRAVENOUS
  Filled 2011-11-24 (×3): qty 250

## 2011-11-24 MED ORDER — HYDROCODONE-ACETAMINOPHEN 5-325 MG PO TABS: 1.0000 | ORAL_TABLET | Freq: Two times a day (BID) | ORAL | Status: DC | PRN

## 2011-11-24 MED ORDER — NADOLOL 20 MG PO TABS
20.0000 mg | ORAL_TABLET | Freq: Every day | ORAL | Status: DC
  Administered 2011-11-24 – 2011-11-25 (×2): 20 mg via ORAL
  Filled 2011-11-24 (×3): qty 1

## 2011-11-24 MED ORDER — NITROGLYCERIN 0.4 MG SL SUBL: 0.4000 mg | SUBLINGUAL_TABLET | SUBLINGUAL | Status: DC | PRN

## 2011-11-24 MED ORDER — TEMAZEPAM 15 MG PO CAPS
30.0000 mg | ORAL_CAPSULE | Freq: Every evening | ORAL | Status: DC | PRN
  Administered 2011-11-25: 30 mg via ORAL
  Filled 2011-11-24: qty 2

## 2011-11-24 MED ORDER — BUDESONIDE-FORMOTEROL FUMARATE 160-4.5 MCG/ACT IN AERO
2.0000 | INHALATION_SPRAY | Freq: Two times a day (BID) | RESPIRATORY_TRACT | Status: DC
  Administered 2011-11-24 – 2011-11-25 (×3): 2 via RESPIRATORY_TRACT
  Filled 2011-11-24: qty 6

## 2011-11-24 MED ORDER — SODIUM CHLORIDE 0.9 % IJ SOLN: 3.0000 mL | Freq: Two times a day (BID) | INTRAMUSCULAR | Status: DC

## 2011-11-24 MED ORDER — CITALOPRAM HYDROBROMIDE 20 MG PO TABS
20.0000 mg | ORAL_TABLET | Freq: Every day | ORAL | Status: DC
  Administered 2011-11-24 – 2011-11-25 (×2): 20 mg via ORAL
  Filled 2011-11-24 (×3): qty 1

## 2011-11-24 MED ORDER — HYDROCHLOROTHIAZIDE 25 MG PO TABS
25.0000 mg | ORAL_TABLET | Freq: Every day | ORAL | Status: DC
  Administered 2011-11-24 – 2011-11-25 (×2): 25 mg via ORAL
  Filled 2011-11-24 (×3): qty 1

## 2011-11-24 MED ORDER — ACETAMINOPHEN 325 MG PO TABS: 650.0000 mg | ORAL_TABLET | ORAL | Status: DC | PRN

## 2011-11-24 MED ORDER — SODIUM CHLORIDE 0.9 % IV SOLN: 1.0000 mL/kg/h | INTRAVENOUS | Status: AC

## 2011-11-24 MED ORDER — ALPRAZOLAM 0.5 MG PO TABS: 0.5000 mg | ORAL_TABLET | Freq: Two times a day (BID) | ORAL | Status: DC | PRN

## 2011-11-24 MED ORDER — LIDOCAINE HCL (PF) 1 % IJ SOLN
INTRAMUSCULAR | Status: AC
  Filled 2011-11-24: qty 30

## 2011-11-24 MED ORDER — NITROGLYCERIN 0.2 MG/ML ON CALL CATH LAB
INTRAVENOUS | Status: AC
  Filled 2011-11-24: qty 1

## 2011-11-24 MED ORDER — MIDAZOLAM HCL 2 MG/2ML IJ SOLN
INTRAMUSCULAR | Status: AC
  Filled 2011-11-24: qty 2

## 2011-11-24 MED ORDER — AMIODARONE HCL 100 MG PO TABS
100.0000 mg | ORAL_TABLET | Freq: Every day | ORAL | Status: DC
  Administered 2011-11-24 – 2011-11-25 (×2): 100 mg via ORAL
  Filled 2011-11-24 (×3): qty 1

## 2011-11-24 MED ORDER — HEPARIN (PORCINE) IN NACL 2-0.9 UNIT/ML-% IJ SOLN
INTRAMUSCULAR | Status: AC
  Filled 2011-11-24: qty 1000

## 2011-11-24 NOTE — Progress Notes (Signed)
CARDIOTHORACIC SURGERY PROGRESS NOTE  Day of Surgery  S/P Procedure(s) (LRB): LEFT HEART CATHETERIZATION WITH CORONARY ANGIOGRAM (N/A)  Subjective: Scott [name] is well known to me from recent outpatient consultation  Objective: Vital signs in last 24 hours: Temp:  [97.8 F (36.6 C)-98.9 F (37.2 C)] 98.9 F (37.2 C) (09/30 2100) Pulse Rate:  [51-69] 63  (09/30 2140) Cardiac Rhythm:  [-] Normal sinus rhythm (09/30 2005) Resp:  [18] 18  (09/30 2140) BP: (111-148)/(68-85) 111/69 mmHg (09/30 2100) SpO2:  [94 %-99 %] 95 % (09/30 2140) Weight:  [88.451 kg (195 lb)] 88.451 kg (195 lb) (09/30 0552)  Physical Exam:  Rhythm:   sinus  Breath sounds: clear  Heart sounds:  RRR  Incisions:  n/a  Abdomen:  soft  Extremities:  warm   Intake/Output from previous day:   Intake/Output this shift:    Lab Results:  Basename 11/24/11 0604  WBC 6.3  HGB 14.5  HCT 41.3  PLT 166   BMET:  Basename 11/24/11 1920 11/24/11 0604  NA 141 143  K 4.2 3.5  CL 103 107  CO2 31 28  GLUCOSE 78 105*  BUN 17 22  CREATININE 1.12 1.17  CALCIUM 9.4 9.1    CBG (last 3)  No results found for this basename: GLUCAP:3 in the last 72 hours PT/INR:   Basename 11/24/11 1920  LABPROT 16.3*  INR 1.34    CXR:  N/A  Cardiac Catheterization Procedure Note  Name: Scott [name]  MRN: [id number]  DOB: December 24, 1955  Procedure: Left Heart Cath, Selective Coronary Angiography, LV angiography, descending thoracic and abdominal aortography.  Indication: 56 year-old white male who has refractory atrial fibrillation despite optimal medical therapy who is under consideration for a surgical Maze procedure.  Procedural details: The left groin was prepped, draped, and anesthetized with 1% lidocaine. Using modified Seldinger technique, a 5 French sheath was introduced into the left femoral artery. Standard Judkins catheters were used for coronary angiography and left ventriculography. Catheter exchanges were performed  over a guidewire. There were no immediate procedural complications. Hemostasis was obtained with a Mynx closure device with good hemostasis. The patient was transferred to the post catheterization recovery area for further monitoring.  Procedural Findings:  Hemodynamics:  AO 143/78 with a mean of 105 mmHg  LV 42/16 mmHg  Coronary angiography:  Coronary dominance: right  Left mainstem: There is mild calcification of the left main. The distal left main there is a 20-30% acentric stenosis.  Left anterior descending (LAD): The LAD is mild to moderately calcified. There is nonobstructive disease in the proximal vessel up to 20%. The mid vessel is somewhat diffusely diseased up to 30%. The first diagonal is a moderate size vessel which trifurcates. It has minor irregularities.  Left circumflex (LCx): The left circumflex gives rise to 2 marginal branches before terminating in the AV groove. It has minor irregularities less than 10%.  Right coronary artery (RCA): The right coronary is a large dominant vessel. In the proximal vessel there is a focal 80-90% stenosis. It is moderately calcified. The remainder the vessel is without significant disease.  Left ventriculography: Left ventricular systolic function is normal, LVEF is estimated at 55-65%, there is no significant mitral regurgitation  Descending thoracic and abdominal aortography demonstrates no focal aneurysm. There is no significant obstructive aortoiliac disease. The renal arteries appear without significant stenosis.  Final Conclusions:  1. Single vessel obstructive coronary disease.  2. Normal left ventricular function.  3. No significant aortoiliac disease.  Assessment/Plan: S/P Procedure(s) (LRB): LEFT HEART CATHETERIZATION WITH CORONARY ANGIOGRAM (N/A)  Results of cath reviewed with Scott [name] at length (more than 30 minutes at bedside).  Options include PCI and stenting of RCA with or without delayed maze procedure via minimally invasive  approach versus CABG + maze at this time.  I do not think that maze procedure without revascularization of high-grade proximal RCA stenosis would be wise.  The risks and benefits of each approach have been reviewed.  We plan CABG + maze on Wednesday.  The patient understands and accepts all potential associated risks of surgery including but not limited to risk of death, stroke, myocardial infarction, congestive heart failure, respiratory failure, renal failure, bleeding requiring blood transfusion and/or reexploration, arrhythmia, heart block or bradycardia requiring permanent pacemaker, pneumonia, pleural effusion, wound infection, pulmonary embolus or other thromboembolic complication, chronic pain or other delayed complications.  All questions answered.   Verda Mehta H 11/24/2011 10:20 PM

## 2011-11-24 NOTE — H&P (Signed)
History and Physical   Patient ID: Scott [name] MRN: [id number], DOB/AGE: 26-Dec-1955   Admit date: 11/24/2011 Date of Consult: 11/24/2011   Primary Physician: Oliver Barre, MD Primary Cardiologist: [name], MD (EP)  HPI: [name] is a 56yo Caucasian male with PMHx significant for paroxysmal atrial fibrillation (s/p multiples RFAs for atrial fibrillation/flutter 2005-2011), chronic diastolic dysfunction, HTN, HL, hypothyroidism, COPD, OSA and history of tobacco abuse.   Last echo 09/2011 revealed LVEF 55-60%, grade 1 diastolic dysfunction, mild LVH, no WMAs. He has had multiple RFAs for atrial fibrillation/flutter from 2005 to 2011. Normal nuclear stress test 02/2010- normal LV function with scar or ischemia.   He is currently on amiodarone and chronic Coumadin anticoagulation. He reports doing well for a period of 6-7 months after each RFA procedure, but then experiencing recurrent, paroxysmal episodes of atrial fibrillation with symptoms consisting of shortness of breath, palpitations, fatigue and decreased functional capacity. He followed up with Dr. Johney Frame in 09/2011 at which time the decision was made to pursue further invasive therapy in the form of MAZE operation as medical options were exhausted. He was referred to Dr. Cornelius Moras with [facility] for further evaluation/consideration. PFTs were reviewed and deemed satisfactory. Options were discussed with the patient, and the decision was made to proceed with minimally invasive MAZE procedure.   During this time and since follow-up, he denies chest pain, lightheadedness, PND, orthopnea, LE edema, n/v/d, fevers, chills or active bleeding. No prior CAD history. He presents today for pre-op cardiac catheterization. Of note, Dr. Cornelius Moras requested radial or left groin access with imaging of thoracic descending and abdominal aorta to be performed to rule out significant aortoiliac occlusive disease. Coumadin has been held since Friday 09/27. He is  currently comfortable, in NAD. He is NPO.  Problem List: Past Medical History  Diagnosis Date  . HYPOTHYROIDISM, ACQUIRED NEC 09/28/2006  . HYPERLIPIDEMIA 09/28/2006  . ANXIETY 09/28/2006  . OBSTRUCTIVE SLEEP APNEA 01/06/2008  . HYPERTENSION 09/28/2006  . Atrial fibrillation 09/28/2006    s/p afib ablation x 2  . Diastolic dysfunction 09/28/2006  . ALLERGIC RHINITIS 01/14/2007  . CHRONIC OBSTRUCTIVE PULMONARY DISEASE, ACUTE EXACERBATION 10/04/2008  . Long term current use of anticoagulant 04/08/2010  . Right knee DJD 07/09/2010  . Asbestos exposure     Past Surgical History  Procedure Date  . Hand surgury     left  . Radiofrequency ablation for atrial flutter 2005    CTI  . Radiofrequency ablation for atrial fibrillation 12/30/07 and 06/05/09    afib ablation x 2 by JA     Allergies:  Allergies  Allergen Reactions  . Zolpidem Tartrate     REACTION: bad hangover    Home Medications: Prior to Admission medications   Medication Sig Start Date End Date Taking? Authorizing Provider  albuterol (PROAIR HFA) 108 (90 BASE) MCG/ACT inhaler Inhale 2 puffs into the lungs every 4 (four) hours as needed. For shortness of breath   Yes Historical Provider, MD  ALPRAZolam (XANAX) 0.5 MG tablet Take 0.5 mg by mouth 2 (two) times daily as needed. For anxiety 08/26/11 08/25/12 Yes Corwin Levins, MD  amiodarone (PACERONE) 200 MG tablet Take 100 mg by mouth daily. a 09/23/11  Yes Rosalio Macadamia, NP  amLODipine (NORVASC) 5 MG tablet Take 5 mg by mouth daily.   Yes Historical Provider, MD  budesonide-formoterol (SYMBICORT) 160-4.5 MCG/ACT inhaler Inhale 2 puffs into the lungs 2 (two) times daily. 07/09/10  Yes Corwin Levins, MD  citalopram (  CELEXA) 20 MG tablet Take 20 mg by mouth daily.   Yes Historical Provider, MD  enalapril (VASOTEC) 10 MG tablet Take 10 mg by mouth daily. 04/28/11  Yes Marinus Maw, MD  hydrochlorothiazide (HYDRODIURIL) 25 MG tablet Take 25 mg by mouth daily. 04/02/11  Yes [name], MD    HYDROcodone-acetaminophen (NORCO/VICODIN) 5-325 MG per tablet Take 1 tablet by mouth 2 (two) times daily as needed. For pain 07/28/11  Yes Corwin Levins, MD  lovastatin (MEVACOR) 40 MG tablet Take 80 mg by mouth daily. Take two tablets by mouth once daily 11/03/11  Yes Corwin Levins, MD  nadolol (CORGARD) 20 MG tablet Take 20 mg by mouth daily.   Yes Historical Provider, MD  temazepam (RESTORIL) 30 MG capsule Take 30 mg by mouth at bedtime as needed. For sleep 10/03/11  Yes Corwin Levins, MD  warfarin (COUMADIN) 2.5 MG tablet Take 2.5-3.75 mg by mouth daily. Take 3.75mg  (1&1/2 tablet) on Sun. Take 2.5mg  (1 tablet) all other days) 06/05/11  Yes Corwin Levins, MD    Inpatient Medications:     . aspirin  324 mg Oral Pre-Cath  . sodium chloride  3 mL Intravenous Q12H  . DISCONTD: aminocaproic acid (AMICAR) for OHS   Intravenous To OR  . DISCONTD: cefUROXime (ZINACEF)  IV  1.5 g Intravenous To OR  . DISCONTD: cefUROXime (ZINACEF)  IV  750 mg Intravenous To OR  . DISCONTD: chlorhexidine  30 mL Topical UD  . DISCONTD: dexmedetomidine  0.1-0.7 mcg/kg/hr Intravenous To OR  . DISCONTD: DOPamine  2-20 mcg/kg/min Intravenous To OR  . DISCONTD: epinephrine  0.5-20 mcg/min Intravenous To OR  . DISCONTD: heparin-papaverine-plasmalyte irrigation   Irrigation To OR  . DISCONTD: insulin (NOVOLIN-R) infusion   Intravenous To OR  . DISCONTD: magnesium sulfate  40 mEq Other To OR  . DISCONTD: metoprolol tartrate  12.5 mg Oral Once  . DISCONTD: nitroGLYCERIN  2-200 mcg/min Intravenous To OR  . DISCONTD: phenylephrine (NEO-SYNEPHRINE) Adult infusion  30-200 mcg/min Intravenous To OR  . DISCONTD: potassium chloride  80 mEq Other To OR  . DISCONTD: vancomycin  1,250 mg Intravenous To OR   Prescriptions prior to admission  Medication Sig Dispense Refill  . albuterol (PROAIR HFA) 108 (90 BASE) MCG/ACT inhaler Inhale 2 puffs into the lungs every 4 (four) hours as needed. For shortness of breath      . ALPRAZolam (XANAX)  0.5 MG tablet Take 0.5 mg by mouth 2 (two) times daily as needed. For anxiety      . amiodarone (PACERONE) 200 MG tablet Take 100 mg by mouth daily. a      . amLODipine (NORVASC) 5 MG tablet Take 5 mg by mouth daily.      . budesonide-formoterol (SYMBICORT) 160-4.5 MCG/ACT inhaler Inhale 2 puffs into the lungs 2 (two) times daily.      . citalopram (CELEXA) 20 MG tablet Take 20 mg by mouth daily.      . enalapril (VASOTEC) 10 MG tablet Take 10 mg by mouth daily.      . hydrochlorothiazide (HYDRODIURIL) 25 MG tablet Take 25 mg by mouth daily.      Marland Kitchen HYDROcodone-acetaminophen (NORCO/VICODIN) 5-325 MG per tablet Take 1 tablet by mouth 2 (two) times daily as needed. For pain      . lovastatin (MEVACOR) 40 MG tablet Take 80 mg by mouth daily. Take two tablets by mouth once daily      . nadolol (CORGARD) 20 MG tablet  Take 20 mg by mouth daily.      . temazepam (RESTORIL) 30 MG capsule Take 30 mg by mouth at bedtime as needed. For sleep      . warfarin (COUMADIN) 2.5 MG tablet Take 2.5-3.75 mg by mouth daily. Take 3.75mg  (1&1/2 tablet) on Sun. Take 2.5mg  (1 tablet) all other days)        Family History  Problem Relation Age of Onset  . Dementia Father   . Hypertension Father   . Atrial fibrillation Mother   . Stroke Mother      History   Social History  . Marital Status: Single    Spouse Name: N/A    Number of Children: N/A  . Years of Education: N/A   Occupational History  . worked previously in the [employer] and feels that he was exposed to asbestos.    Social History Main Topics  . Smoking status: Former Games developer  . Smokeless tobacco: Not on file   Comment: not smoked over 10 years  . Alcohol Use: No  . Drug Use: No  . Sexually Active: Not Currently   Other Topics Concern  . Not on file   Social History Narrative   Pt lives in [place], [place] with his friend.      Review of Systems: General: positive for fatigue, decreased functional capacity, negative for chills, fever,  night sweats or weight changes.  Cardiovascular: positive for palpitations, shortness of breath, negative for chest pain, dyspnea on exertion, edema, orthopnea, paroxysmal nocturnal dyspnea  Dermatological: negative for rash Respiratory: negative for cough or wheezing Urologic: negative for hematuria Abdominal: negative for nausea, vomiting, diarrhea, bright red blood per rectum, melena, or hematemesis Neurologic:  negative for visual changes, syncope, or dizziness All other systems reviewed and are otherwise negative except as noted above.  Physical Exam: Blood pressure 137/85, pulse 59, temperature 97.8 F (36.6 C), temperature source Oral, resp. rate 18, height 5\' 6"  (1.676 m), weight 88.451 kg (195 lb), SpO2 98.00%.    General: Well developed, well nourished, in no acute distress. Head: Normocephalic, atraumatic, sclera non-icteric, no xanthomas, nares are without discharge.  Neck: Negative for carotid bruits. JVD not elevated. Lungs: Clear bilaterally to auscultation without wheezes, rales, or rhonchi. Breathing is unlabored. Heart: Bradycardic, regular, with S1 S2. No murmurs, rubs, or gallops appreciated. Abdomen: Soft, non-tender, non-distended with normoactive bowel sounds. No hepatomegaly. No rebound/guarding. No obvious abdominal masses. Msk:  Strength and tone appears normal for age. Extremities: No clubbing, cyanosis or edema.  Distal pedal pulses are 2+ and equal bilaterally. Neuro: Alert and oriented X 3. Moves all extremities spontaneously. Psych:  Responds to questions appropriately with a normal affect.  Labs: Recent Labs  [facility] 11/24/11 0604   WBC 6.3   HGB 14.5   HCT 41.3   MCV 92.2   PLT 166   Lab 11/24/11 0604  NA 143  K 3.5  CL 107  CO2 28  BUN 22  CREATININE 1.17  CALCIUM 9.1  PROT --  BILITOT --  ALKPHOS --  ALT --  AST --  AMYLASE --  LIPASE --  GLUCOSE 105*   Radiology/Studies: No results found.  EKG: sinus bradycardia, 1st degree AVB,  LAD, no ST/T wave changes  ASSESSMENT:   1. Paroxysmal atrial fibrillation 2. Chronic diastolic CHF 3. HTN 4. HL 5. Hypothyroidism 6. COPD 7. OSA  DISCUSSION/PLAN:  The patient presents for pre-op cardiac cath today. He is currently comfortable, in NAD and NPO in pre-cath holding.  On exam, he is stable, euvolemic. Currently in sinus rhythm. Will prep R radial and left groin for access. Angiography to include descending thoracic and abdominal aorta imaging. Will admit to telemetry post-cath. Plan for heparin over Coumadin in anticipation for surgery this week. Continue outpatient medications. Further recommendations to be made by the interventionalist.   Signed, R. Hurman Horn, PA-C 11/24/2011, 7:47 AM   Patient seen and examined and history reviewed. Agree with above findings and plan. Will admit post cath for IV anticoagulation with anticipated surgery on 11/26/11.  Theron Arista [facility] 11/24/2011 8:39 AM

## 2011-11-24 NOTE — Interval H&P Note (Signed)
History and Physical Interval Note:  11/24/2011 8:40 AM  [name]  has presented today for surgery, with the diagnosis of cp  The various methods of treatment have been discussed with the patient and family. After consideration of risks, benefits and other options for treatment, the patient has consented to  Procedure(s) (LRB) with comments: LEFT HEART CATHETERIZATION WITH CORONARY ANGIOGRAM (N/A) as a surgical intervention .  The patient's history has been reviewed, patient examined, no change in status, stable for surgery.  I have reviewed the patient's chart and labs.  Questions were answered to the patient's satisfaction.     Theron Arista [facility] 11/24/2011 8:41 AM

## 2011-11-24 NOTE — Progress Notes (Signed)
PT refused his cpap, said he does not like to wear it and only uses it once and awhile at home

## 2011-11-24 NOTE — CV Procedure (Signed)
   Cardiac Catheterization Procedure Note  Name: Scott [name] MRN: [id number] DOB: 1955/09/11  Procedure: Left Heart Cath, Selective Coronary Angiography, LV angiography, descending thoracic and abdominal aortography.  Indication: 56 year-old white male who has refractory atrial fibrillation despite optimal medical therapy who is under consideration for a surgical Maze procedure.   Procedural details: The left groin was prepped, draped, and anesthetized with 1% lidocaine. Using modified Seldinger technique, a 5 French sheath was introduced into the left femoral artery. Standard Judkins catheters were used for coronary angiography and left ventriculography. Catheter exchanges were performed over a guidewire. There were no immediate procedural complications. Hemostasis was obtained with a Mynx closure device with good hemostasis. The patient was transferred to the post catheterization recovery area for further monitoring.  Procedural Findings: Hemodynamics:  AO 143/78 with a mean of 105 mmHg LV 42/16 mmHg   Coronary angiography: Coronary dominance: right  Left mainstem: There is mild calcification of the left main. The distal left main there is a 20-30% acentric stenosis.  Left anterior descending (LAD): The LAD is mild to moderately calcified. There is nonobstructive disease in the proximal vessel up to 20%. The mid vessel is somewhat diffusely diseased up to 30%. The first diagonal is a moderate size vessel which trifurcates. It has minor irregularities.  Left circumflex (LCx): The left circumflex gives rise to 2 marginal branches before terminating in the AV groove. It has minor irregularities less than 10%.  Right coronary artery (RCA): The right coronary is a large dominant vessel. In the proximal vessel there is a focal 80-90% stenosis. It is moderately calcified. The remainder the vessel is without significant disease.  Left ventriculography: Left ventricular systolic function is  normal, LVEF is estimated at 55-65%, there is no significant mitral regurgitation   Descending thoracic and abdominal aortography demonstrates no focal aneurysm. There is no significant obstructive aortoiliac disease. The renal arteries appear without significant stenosis.  Final Conclusions:   1. Single vessel obstructive coronary disease. 2. Normal left ventricular function. 3. No significant aortoiliac disease.  Recommendations: Angiographic films were reviewed with Dr. Cornelius Moras. We also discussed findings with Dr. Johney Frame. The options were presented to the stent the right coronary with a bare-metal stent and return a later date for surgical Maze procedure or to proceed with surgical Maze procedure with bypass of the right coronary with a right mammary graft. These options were discussed with the patient who opted for a surgical approach.  Theron Arista [facility] 11/24/2011, 9:36 AM

## 2011-11-24 NOTE — Progress Notes (Signed)
ANTICOAGULATION CONSULT NOTE - Initial Consult  Pharmacy Consult for Heparin  Indication: ACS/afib  Allergies  Allergen Reactions  . Zolpidem Tartrate     REACTION: bad hangover    Patient Measurements: Height: 5\' 6"  (167.6 cm) Weight: 195 lb (88.451 kg) IBW/kg (Calculated) : 63.8  Heparin Dosing Weight: 83 kg   Vital Signs: Temp: 97.8 F (36.6 C) (09/30 0959) Temp src: Oral (09/30 0959) BP: 124/74 mmHg (09/30 1030) Pulse Rate: 58  (09/30 1030)  Labs:  [facility] 11/24/11 0604  HGB 14.5  HCT 41.3  PLT 166  APTT --  LABPROT 18.3*  INR 1.57*  HEPARINUNFRC --  CREATININE 1.17  CKTOTAL --  CKMB --  TROPONINI --    Estimated Creatinine Clearance: 73.5 ml/min (by C-G formula based on Cr of 1.17).   Medical History: Past Medical History  Diagnosis Date  . HYPOTHYROIDISM, ACQUIRED NEC 09/28/2006  . HYPERLIPIDEMIA 09/28/2006  . ANXIETY 09/28/2006  . OBSTRUCTIVE SLEEP APNEA 01/06/2008  . HYPERTENSION 09/28/2006  . Atrial fibrillation 09/28/2006    s/p afib ablation x 2  . Diastolic dysfunction 09/28/2006  . ALLERGIC RHINITIS 01/14/2007  . CHRONIC OBSTRUCTIVE PULMONARY DISEASE, ACUTE EXACERBATION 10/04/2008  . Long term current use of anticoagulant 04/08/2010  . Right knee DJD 07/09/2010  . Asbestos exposure     Medications:  Prescriptions prior to admission  Medication Sig Dispense Refill  . albuterol (PROAIR HFA) 108 (90 BASE) MCG/ACT inhaler Inhale 2 puffs into the lungs every 4 (four) hours as needed. For shortness of breath      . ALPRAZolam (XANAX) 0.5 MG tablet Take 0.5 mg by mouth 2 (two) times daily as needed. For anxiety      . amiodarone (PACERONE) 200 MG tablet Take 100 mg by mouth daily. a      . amLODipine (NORVASC) 5 MG tablet Take 5 mg by mouth daily.      . budesonide-formoterol (SYMBICORT) 160-4.5 MCG/ACT inhaler Inhale 2 puffs into the lungs 2 (two) times daily.      . citalopram (CELEXA) 20 MG tablet Take 20 mg by mouth daily.      . enalapril (VASOTEC)  10 MG tablet Take 10 mg by mouth daily.      . hydrochlorothiazide (HYDRODIURIL) 25 MG tablet Take 25 mg by mouth daily.      Marland Kitchen HYDROcodone-acetaminophen (NORCO/VICODIN) 5-325 MG per tablet Take 1 tablet by mouth 2 (two) times daily as needed. For pain      . lovastatin (MEVACOR) 40 MG tablet Take 80 mg by mouth daily. Take two tablets by mouth once daily      . nadolol (CORGARD) 20 MG tablet Take 20 mg by mouth daily.      . temazepam (RESTORIL) 30 MG capsule Take 30 mg by mouth at bedtime as needed. For sleep      . warfarin (COUMADIN) 2.5 MG tablet Take 2.5-3.75 mg by mouth daily. Take 3.75mg  (1&1/2 tablet) on Sun. Take 2.5mg  (1 tablet) all other days)       Scheduled:    . amiodarone  100 mg Oral Daily  . amLODipine  5 mg Oral Daily  . aspirin  324 mg Oral Pre-Cath  . aspirin EC  81 mg Oral Daily  . budesonide-formoterol  2 puff Inhalation BID  . citalopram  20 mg Oral Daily  . enalapril  10 mg Oral Daily  . fentaNYL      . heparin      . hydrochlorothiazide  25 mg Oral  Daily  . lidocaine      . midazolam      . nadolol  20 mg Oral Daily  . nitroGLYCERIN      . simvastatin  40 mg Oral q1800  . sodium chloride  3 mL Intravenous Q12H  . sodium chloride  3 mL Intravenous Q12H  . verapamil      . DISCONTD: aminocaproic acid (AMICAR) for OHS   Intravenous To OR  . DISCONTD: cefUROXime (ZINACEF)  IV  1.5 g Intravenous To OR  . DISCONTD: cefUROXime (ZINACEF)  IV  750 mg Intravenous To OR  . DISCONTD: chlorhexidine  30 mL Topical UD  . DISCONTD: dexmedetomidine  0.1-0.7 mcg/kg/hr Intravenous To OR  . DISCONTD: DOPamine  2-20 mcg/kg/min Intravenous To OR  . DISCONTD: epinephrine  0.5-20 mcg/min Intravenous To OR  . DISCONTD: heparin-papaverine-plasmalyte irrigation   Irrigation To OR  . DISCONTD: insulin (NOVOLIN-R) infusion   Intravenous To OR  . DISCONTD: magnesium sulfate  40 mEq Other To OR  . DISCONTD: metoprolol tartrate  12.5 mg Oral Once  . DISCONTD: nitroGLYCERIN  2-200  mcg/min Intravenous To OR  . DISCONTD: phenylephrine (NEO-SYNEPHRINE) Adult infusion  30-200 mcg/min Intravenous To OR  . DISCONTD: potassium chloride  80 mEq Other To OR  . DISCONTD: vancomycin  1,250 mg Intravenous To OR    Assessment: 56 yr old male on heparin for ACS/STEMI, s/p cath with anticipated surgery on 11/26/11.  Patient on coumadin PTA for afib.    Goal of Therapy:  Heparin level 0.3-0.7 units/ml Monitor platelets by anticoagulation protocol: Yes   Plan:  Start Heparin at 1000 units/hr at 1730 today(8 hours after sheath removal) Heparin level 6 hours after drip start ~2330 tonight. Daily heparin level/CBC.  Wendie Simmer, PharmD, BCPS Clinical Pharmacist  Pager: [phone]

## 2011-11-24 NOTE — Progress Notes (Signed)
ANTICOAGULATION CONSULT NOTE - Follow Up Consult  Pharmacy Consult for Heparin  Indication: ACS/afib  Allergies  Allergen Reactions  . Zolpidem Tartrate     REACTION: bad hangover    Patient Measurements: Height: 5\' 6"  (167.6 cm) Weight: 195 lb (88.451 kg) IBW/kg (Calculated) : 63.8  Heparin Dosing Weight: 83 kg   Vital Signs: Temp: 98.9 F (37.2 C) (09/30 2100) Temp src: Oral (09/30 2100) BP: 111/69 mmHg (09/30 2100) Pulse Rate: 63  (09/30 2140)  Labs:  Basename 11/24/11 2247 11/24/11 1920 11/24/11 1448 11/24/11 0604  HGB -- -- -- 14.5  HCT -- -- -- 41.3  PLT -- -- -- 166  APTT -- 34 32 --  LABPROT -- 16.3* -- 18.3*  INR -- 1.34 -- 1.57*  HEPARINUNFRC 0.16* -- -- --  CREATININE -- 1.12 -- 1.17  CKTOTAL -- -- -- --  CKMB -- -- -- --  TROPONINI -- <0.30 <0.30 --    Estimated Creatinine Clearance: 76.8 ml/min (by C-G formula based on Cr of 1.12).   Medical History: Past Medical History  Diagnosis Date  . HYPOTHYROIDISM, ACQUIRED NEC 09/28/2006  . HYPERLIPIDEMIA 09/28/2006  . ANXIETY 09/28/2006  . OBSTRUCTIVE SLEEP APNEA 01/06/2008  . HYPERTENSION 09/28/2006  . Atrial fibrillation 09/28/2006    s/p afib ablation x 2  . Diastolic dysfunction 09/28/2006  . ALLERGIC RHINITIS 01/14/2007  . CHRONIC OBSTRUCTIVE PULMONARY DISEASE, ACUTE EXACERBATION 10/04/2008  . Long term current use of anticoagulant 04/08/2010  . Right knee DJD 07/09/2010  . Asbestos exposure   . Coronary artery disease 11/24/2011    Medications:  Prescriptions prior to admission  Medication Sig Dispense Refill  . albuterol (PROAIR HFA) 108 (90 BASE) MCG/ACT inhaler Inhale 2 puffs into the lungs every 4 (four) hours as needed. For shortness of breath      . ALPRAZolam (XANAX) 0.5 MG tablet Take 0.5 mg by mouth 2 (two) times daily as needed. For anxiety      . amiodarone (PACERONE) 200 MG tablet Take 100 mg by mouth daily. a      . amLODipine (NORVASC) 5 MG tablet Take 5 mg by mouth daily.      .  budesonide-formoterol (SYMBICORT) 160-4.5 MCG/ACT inhaler Inhale 2 puffs into the lungs 2 (two) times daily.      . citalopram (CELEXA) 20 MG tablet Take 20 mg by mouth daily.      . enalapril (VASOTEC) 10 MG tablet Take 10 mg by mouth daily.      . hydrochlorothiazide (HYDRODIURIL) 25 MG tablet Take 25 mg by mouth daily.      Marland Kitchen HYDROcodone-acetaminophen (NORCO/VICODIN) 5-325 MG per tablet Take 1 tablet by mouth 2 (two) times daily as needed. For pain      . lovastatin (MEVACOR) 40 MG tablet Take 80 mg by mouth daily. Take two tablets by mouth once daily      . nadolol (CORGARD) 20 MG tablet Take 20 mg by mouth daily.      . temazepam (RESTORIL) 30 MG capsule Take 30 mg by mouth at bedtime as needed. For sleep      . warfarin (COUMADIN) 2.5 MG tablet Take 2.5-3.75 mg by mouth daily. Take 3.75mg  (1&1/2 tablet) on Sun. Take 2.5mg  (1 tablet) all other days)       Scheduled:     . amiodarone  100 mg Oral Daily  . amLODipine  5 mg Oral Daily  . aspirin  324 mg Oral Pre-Cath  . aspirin EC  81 mg  Oral Daily  . budesonide-formoterol  2 puff Inhalation BID  . citalopram  20 mg Oral Daily  . enalapril  10 mg Oral Daily  . fentaNYL      . heparin      . hydrochlorothiazide  25 mg Oral Daily  . lidocaine      . midazolam      . nadolol  20 mg Oral Daily  . nitroGLYCERIN      . simvastatin  40 mg Oral q1800  . sodium chloride  3 mL Intravenous Q12H  . verapamil      . DISCONTD: sodium chloride  3 mL Intravenous Q12H    Assessment: 56 yr old male on heparin for ACS/STEMI, s/p cath with anticipated surgery on 11/26/11.  Patient on coumadin PTA for afib.    Heparin level (0.16) is below-goal on 1000 units/hr. No problem with line / infusion per RN.   Goal of Therapy:  Heparin level 0.3-0.7 units/ml Monitor platelets by anticoagulation protocol: Yes   Plan:  1. Increase IV heparin to 1300 units/hr. 2. Heparin level in 6 hours.   Lorre Munroe, PharmD 11/24/11, 23:33

## 2011-11-25 ENCOUNTER — Inpatient Hospital Stay (HOSPITAL_COMMUNITY): Payer: Medicaid Other

## 2011-11-25 ENCOUNTER — Encounter (HOSPITAL_COMMUNITY): Payer: Self-pay | Admitting: *Deleted

## 2011-11-25 DIAGNOSIS — I2510 Atherosclerotic heart disease of native coronary artery without angina pectoris: Secondary | ICD-10-CM | POA: Diagnosis present

## 2011-11-25 DIAGNOSIS — Z01810 Encounter for preprocedural cardiovascular examination: Secondary | ICD-10-CM

## 2011-11-25 DIAGNOSIS — I4891 Unspecified atrial fibrillation: Secondary | ICD-10-CM

## 2011-11-25 DIAGNOSIS — R079 Chest pain, unspecified: Secondary | ICD-10-CM

## 2011-11-25 LAB — CBC WITH DIFFERENTIAL/PLATELET
Basophils Absolute: 0 10*3/uL (ref 0.0–0.1)
Eosinophils Relative: 3 % (ref 0–5)
HCT: 45.8 % (ref 39.0–52.0)
Hemoglobin: 15.9 g/dL (ref 13.0–17.0)
Lymphocytes Relative: 11 % — ABNORMAL LOW (ref 12–46)
Lymphs Abs: 1 10*3/uL (ref 0.7–4.0)
MCV: 92.3 fL (ref 78.0–100.0)
Monocytes Absolute: 0.7 10*3/uL (ref 0.1–1.0)
Monocytes Relative: 8 % (ref 3–12)
Neutro Abs: 7.3 10*3/uL (ref 1.7–7.7)
RDW: 12.9 % (ref 11.5–15.5)
WBC: 9.2 10*3/uL (ref 4.0–10.5)

## 2011-11-25 LAB — CBC
HCT: 44.2 % (ref 39.0–52.0)
HCT: 46.1 % (ref 39.0–52.0)
Hemoglobin: 15.5 g/dL (ref 13.0–17.0)
Hemoglobin: 16.4 g/dL (ref 13.0–17.0)
MCH: 32.8 pg (ref 26.0–34.0)
MCHC: 35.1 g/dL (ref 30.0–36.0)
MCHC: 35.6 g/dL (ref 30.0–36.0)
MCV: 91.9 fL (ref 78.0–100.0)
MCV: 92.2 fL (ref 78.0–100.0)
RDW: 12.9 % (ref 11.5–15.5)

## 2011-11-25 LAB — BLOOD GAS, ARTERIAL
Acid-base deficit: 0.5 mmol/L (ref 0.0–2.0)
Bicarbonate: 23.4 mEq/L (ref 20.0–24.0)
Drawn by: 31288
FIO2: 0.21 %
O2 Saturation: 95.4 %
Patient temperature: 98.6
TCO2: 24.5 mmol/L (ref 0–100)
pCO2 arterial: 36.3 mmHg (ref 35.0–45.0)
pH, Arterial: 7.425 (ref 7.350–7.450)
pO2, Arterial: 77.1 mmHg — ABNORMAL LOW (ref 80.0–100.0)

## 2011-11-25 LAB — BASIC METABOLIC PANEL
BUN: 14 mg/dL (ref 6–23)
Creatinine, Ser: 1.11 mg/dL (ref 0.50–1.35)
GFR calc non Af Amer: 72 mL/min — ABNORMAL LOW (ref >90)
Glucose, Bld: 98 mg/dL (ref 70–99)
Potassium: 3.7 mEq/L (ref 3.5–5.1)

## 2011-11-25 LAB — LIPID PANEL: Cholesterol: 167 mg/dL (ref 0–200)

## 2011-11-25 LAB — HEPARIN LEVEL (UNFRACTIONATED): Heparin Unfractionated: 0.46 IU/mL (ref 0.30–0.70)

## 2011-11-25 LAB — HEMOGLOBIN A1C: Hgb A1c MFr Bld: 5.6 % (ref <5.7)

## 2011-11-25 MED ORDER — HEPARIN (PORCINE) IN NACL 100-0.45 UNIT/ML-% IJ SOLN
1300.0000 [IU]/h | INTRAMUSCULAR | Status: DC
  Filled 2011-11-25: qty 250

## 2011-11-25 MED ORDER — PHENYLEPHRINE HCL 10 MG/ML IJ SOLN
30.0000 ug/min | INTRAVENOUS | Status: DC
  Filled 2011-11-25: qty 2

## 2011-11-25 MED ORDER — PLASMA-LYTE 148 IV SOLN: INTRAVENOUS | Status: DC

## 2011-11-25 MED ORDER — MORPHINE SULFATE 2 MG/ML IJ SOLN
2.0000 mg | INTRAMUSCULAR | Status: DC | PRN
  Administered 2011-11-25: 2 mg via INTRAVENOUS
  Filled 2011-11-25: qty 1

## 2011-11-25 MED ORDER — CHLORHEXIDINE GLUCONATE 4 % EX LIQD: 60.0000 mL | Freq: Once | CUTANEOUS | Status: DC

## 2011-11-25 MED ORDER — NITROGLYCERIN IN D5W 200-5 MCG/ML-% IV SOLN
2.0000 ug/min | INTRAVENOUS | Status: DC
  Filled 2011-11-25: qty 250

## 2011-11-25 MED ORDER — METOPROLOL TARTRATE 12.5 MG HALF TABLET: 12.5000 mg | ORAL_TABLET | Freq: Once | ORAL | Status: DC

## 2011-11-25 MED ORDER — SODIUM CHLORIDE 0.9 % IV SOLN
1500.0000 mg | INTRAVENOUS | Status: AC
  Administered 2011-11-26: 1500 mg via INTRAVENOUS
  Filled 2011-11-25: qty 1500

## 2011-11-25 MED ORDER — DEXMEDETOMIDINE HCL IN NACL 400 MCG/100ML IV SOLN
0.1000 ug/kg/h | INTRAVENOUS | Status: DC
  Filled 2011-11-25: qty 100

## 2011-11-25 MED ORDER — POTASSIUM CHLORIDE 2 MEQ/ML IV SOLN
80.0000 meq | INTRAVENOUS | Status: DC
  Filled 2011-11-25: qty 40

## 2011-11-25 MED ORDER — PLASMA-LYTE 148 IV SOLN
INTRAVENOUS | Status: DC
  Filled 2011-11-25: qty 2.5

## 2011-11-25 MED ORDER — SODIUM CHLORIDE 0.9 % IV SOLN
INTRAVENOUS | Status: DC
  Filled 2011-11-25: qty 1

## 2011-11-25 MED ORDER — EPINEPHRINE HCL 1 MG/ML IJ SOLN
0.5000 ug/min | INTRAVENOUS | Status: DC
  Filled 2011-11-25: qty 4

## 2011-11-25 MED ORDER — MAGNESIUM SULFATE 50 % IJ SOLN
40.0000 meq | INTRAMUSCULAR | Status: DC
  Filled 2011-11-25: qty 10

## 2011-11-25 MED ORDER — METOPROLOL TARTRATE 12.5 MG HALF TABLET
12.5000 mg | ORAL_TABLET | Freq: Once | ORAL | Status: AC
  Administered 2011-11-26: 12.5 mg via ORAL
  Filled 2011-11-25: qty 1

## 2011-11-25 MED ORDER — ATORVASTATIN CALCIUM 20 MG PO TABS
20.0000 mg | ORAL_TABLET | Freq: Every day | ORAL | Status: DC
  Administered 2011-11-25: 20 mg via ORAL
  Filled 2011-11-25 (×2): qty 1

## 2011-11-25 MED ORDER — DEXMEDETOMIDINE HCL IN NACL 400 MCG/100ML IV SOLN: 0.1000 ug/kg/h | INTRAVENOUS | Status: DC

## 2011-11-25 MED ORDER — DEXTROSE 5 % IV SOLN
750.0000 mg | INTRAVENOUS | Status: DC
  Filled 2011-11-25: qty 750

## 2011-11-25 MED ORDER — SODIUM CHLORIDE 0.9 % IV SOLN
INTRAVENOUS | Status: DC
  Filled 2011-11-25 (×2): qty 40

## 2011-11-25 MED ORDER — DEXTROSE 5 % IV SOLN: 750.0000 mg | INTRAVENOUS | Status: DC

## 2011-11-25 MED ORDER — DEXTROSE 5 % IV SOLN
1.5000 g | INTRAVENOUS | Status: DC
  Filled 2011-11-25: qty 1.5

## 2011-11-25 MED ORDER — TEMAZEPAM 15 MG PO CAPS: 15.0000 mg | ORAL_CAPSULE | Freq: Once | ORAL | Status: DC | PRN

## 2011-11-25 MED ORDER — DOPAMINE-DEXTROSE 3.2-5 MG/ML-% IV SOLN: 2.0000 ug/kg/min | INTRAVENOUS | Status: DC

## 2011-11-25 MED ORDER — VANCOMYCIN HCL 1000 MG IV SOLR
1250.0000 mg | INTRAVENOUS | Status: DC
  Filled 2011-11-25: qty 1250

## 2011-11-25 MED ORDER — BISACODYL 5 MG PO TBEC
5.0000 mg | DELAYED_RELEASE_TABLET | Freq: Once | ORAL | Status: AC
  Administered 2011-11-25: 5 mg via ORAL
  Filled 2011-11-25: qty 1

## 2011-11-25 MED ORDER — CHLORHEXIDINE GLUCONATE 4 % EX LIQD
30.0000 mL | CUTANEOUS | Status: DC
  Administered 2011-11-25: 2 via TOPICAL
  Filled 2011-11-25: qty 60

## 2011-11-25 MED ORDER — CEFUROXIME SODIUM 1.5 G IJ SOLR
1.5000 g | INTRAMUSCULAR | Status: DC
  Filled 2011-11-25 (×2): qty 1.5

## 2011-11-25 MED ORDER — DOPAMINE-DEXTROSE 3.2-5 MG/ML-% IV SOLN
2.0000 ug/kg/min | INTRAVENOUS | Status: DC
  Filled 2011-11-25: qty 250

## 2011-11-25 MED ORDER — SODIUM CHLORIDE 0.9 % IV SOLN: INTRAVENOUS | Status: DC

## 2011-11-25 NOTE — Progress Notes (Signed)
  Called to see pt re: chest pain/SOB. Pt was otherwise doing well and had sudden onset of SSCP described as a burning that radiated down into his legs. This was associated with significant SOB. He went to the bathroom and felt so bad, he could not walk without 2-person assist. Pt was helped back to bed and placed on oxygen. An ECG was performed, no acute ST elevation but some Twave changes (unclear significance). MD to review. Pt currently feels some better on oxygen, has tingling in his legs.  BP 166/97  Pulse 58  Temp 100.2 F (37.9 C) (Rectal)  Resp 28  Ht 5\' 6"  (1.676 m)  Wt 195 lb (88.451 kg)  BMI 31.47 kg/m2  SpO2 100%  On exam, no new findings except abdominal tenderness, voluntary guarding. BP/HR/RR/temp are elevated. No significant abnormality on lung sounds or heart sounds.  Will discuss with MD. For now, hold heparin, check H&H, may need CT abdomen. Treat symptoms aggressively.

## 2011-11-25 NOTE — Progress Notes (Signed)
ANTICOAGULATION CONSULT NOTE - Follow Up Consult  Pharmacy Consult for Heparin  Indication: ACS/afib  Allergies  Allergen Reactions  . Zolpidem Tartrate     REACTION: bad hangover    Patient Measurements: Height: 5\' 6"  (167.6 cm) Weight: 195 lb (88.451 kg) IBW/kg (Calculated) : 63.8  Heparin Dosing Weight: 83 kg   Vital Signs: Temp: 100.2 F (37.9 C) (10/01 1611) Temp src: Rectal (10/01 1611) BP: 166/97 mmHg (10/01 1600) Pulse Rate: 58  (10/01 1600)  Labs:  Basename 11/25/11 1848 11/25/11 1635 11/25/11 1439 11/25/11 0622 11/25/11 0126 11/24/11 2247 11/24/11 1920 11/24/11 1448 11/24/11 0604  HGB 16.4 15.9 -- -- -- -- -- -- --  HCT 46.1 45.8 -- 44.2 -- -- -- -- --  PLT 193 200 -- 192 -- -- -- -- --  APTT -- -- -- -- -- -- 34 32 --  LABPROT -- -- -- -- -- -- 16.3* -- 18.3*  INR -- -- -- -- -- -- 1.34 -- 1.57*  HEPARINUNFRC -- -- 0.46 0.38 -- 0.16* -- -- --  CREATININE -- -- -- 1.11 -- -- 1.12 -- 1.17  CKTOTAL -- -- -- -- -- -- -- -- --  CKMB -- -- -- -- -- -- -- -- --  TROPONINI -- -- -- -- <0.30 -- <0.30 <0.30 --    Estimated Creatinine Clearance: 77.5 ml/min (by C-G formula based on Cr of 1.11).   Medical History: Past Medical History  Diagnosis Date  . HYPOTHYROIDISM, ACQUIRED NEC 09/28/2006  . HYPERLIPIDEMIA 09/28/2006  . ANXIETY 09/28/2006  . OBSTRUCTIVE SLEEP APNEA 01/06/2008  . HYPERTENSION 09/28/2006  . Atrial fibrillation 09/28/2006    s/p afib ablation x 2  . Diastolic dysfunction 09/28/2006  . ALLERGIC RHINITIS 01/14/2007  . CHRONIC OBSTRUCTIVE PULMONARY DISEASE, ACUTE EXACERBATION 10/04/2008  . Long term current use of anticoagulant 04/08/2010  . Right knee DJD 07/09/2010  . Asbestos exposure   . Coronary artery disease 11/24/2011   Scheduled:     . amiodarone  100 mg Oral Daily  . amLODipine  5 mg Oral Daily  . aspirin EC  81 mg Oral Daily  . atorvastatin  20 mg Oral q1800  . bisacodyl  5 mg Oral Once  . budesonide-formoterol  2 puff Inhalation BID  .  chlorhexidine  30 mL Topical UD  . citalopram  20 mg Oral Daily  . enalapril  10 mg Oral Daily  . hydrochlorothiazide  25 mg Oral Daily  . metoprolol tartrate  12.5 mg Oral Once  . nadolol  20 mg Oral Daily  . sodium chloride  3 mL Intravenous Q12H  . vancomycin  1,500 mg Intravenous To OR  . DISCONTD: aminocaproic acid (AMICAR) for OHS   Intravenous To OR  . DISCONTD: cefUROXime (ZINACEF)  IV  1.5 g Intravenous To OR  . DISCONTD: cefUROXime (ZINACEF)  IV  750 mg Intravenous To OR  . DISCONTD: chlorhexidine  60 mL Topical Once  . DISCONTD: dexmedetomidine  0.1-0.7 mcg/kg/hr Intravenous To OR  . DISCONTD: DOPamine  2-20 mcg/kg/min Intravenous To OR  . DISCONTD: heparin-papaverine-plasmalyte irrigation   Irrigation To OR  . DISCONTD: metoprolol tartrate  12.5 mg Oral Once  . DISCONTD: simvastatin  40 mg Oral q1800  . DISCONTD: sodium chloride  3 mL Intravenous Q12H    Assessment: 56 yr old male on heparin for ACS/STEMI, s/p cath with anticipated surgery on 11/26/11.  Patient on coumadin PTA for afib.    Heparin as stopped earlier today B and  now to restart.  Goal of Therapy:  Heparin level 0.3-0.7 units/ml Monitor platelets by anticoagulation protocol: Yes   Plan:  -No bolus due to CABG in am -Restart IV heparin at1300 units/hr. -heparin level in am -heparin to stop prior to OR  Harland German, Pharm D 11/25/2011 8:00 PM

## 2011-11-25 NOTE — Progress Notes (Addendum)
ANTICOAGULATION CONSULT NOTE - Follow Up Consult  Pharmacy Consult for Heparin  Indication: ACS/afib  Allergies  Allergen Reactions  . Zolpidem Tartrate     REACTION: bad hangover    Patient Measurements: Height: 5\' 6"  (167.6 cm) Weight: 195 lb (88.451 kg) IBW/kg (Calculated) : 63.8  Heparin Dosing Weight: 83 kg   Vital Signs: Temp: 98.1 F (36.7 C) (10/01 0500) Temp src: Oral (10/01 0500) BP: 149/88 mmHg (10/01 0500) Pulse Rate: 55  (10/01 0500)  Labs:  Basename 11/25/11 1439 11/25/11 0622 11/25/11 0126 11/24/11 2247 11/24/11 1920 11/24/11 1448 11/24/11 0604  HGB -- 15.5 -- -- -- -- 14.5  HCT -- 44.2 -- -- -- -- 41.3  PLT -- 192 -- -- -- -- 166  APTT -- -- -- -- 34 32 --  LABPROT -- -- -- -- 16.3* -- 18.3*  INR -- -- -- -- 1.34 -- 1.57*  HEPARINUNFRC 0.46 0.38 -- 0.16* -- -- --  CREATININE -- 1.11 -- -- 1.12 -- 1.17  CKTOTAL -- -- -- -- -- -- --  CKMB -- -- -- -- -- -- --  TROPONINI -- -- <0.30 -- <0.30 <0.30 --    Estimated Creatinine Clearance: 77.5 ml/min (by C-G formula based on Cr of 1.11).   Medical History: Past Medical History  Diagnosis Date  . HYPOTHYROIDISM, ACQUIRED NEC 09/28/2006  . HYPERLIPIDEMIA 09/28/2006  . ANXIETY 09/28/2006  . OBSTRUCTIVE SLEEP APNEA 01/06/2008  . HYPERTENSION 09/28/2006  . Atrial fibrillation 09/28/2006    s/p afib ablation x 2  . Diastolic dysfunction 09/28/2006  . ALLERGIC RHINITIS 01/14/2007  . CHRONIC OBSTRUCTIVE PULMONARY DISEASE, ACUTE EXACERBATION 10/04/2008  . Long term current use of anticoagulant 04/08/2010  . Right knee DJD 07/09/2010  . Asbestos exposure   . Coronary artery disease 11/24/2011    Medications:  Prescriptions prior to admission  Medication Sig Dispense Refill  . albuterol (PROAIR HFA) 108 (90 BASE) MCG/ACT inhaler Inhale 2 puffs into the lungs every 4 (four) hours as needed. For shortness of breath      . ALPRAZolam (XANAX) 0.5 MG tablet Take 0.5 mg by mouth 2 (two) times daily as needed. For anxiety       . amiodarone (PACERONE) 200 MG tablet Take 100 mg by mouth daily. a      . amLODipine (NORVASC) 5 MG tablet Take 5 mg by mouth daily.      . budesonide-formoterol (SYMBICORT) 160-4.5 MCG/ACT inhaler Inhale 2 puffs into the lungs 2 (two) times daily.      . citalopram (CELEXA) 20 MG tablet Take 20 mg by mouth daily.      . enalapril (VASOTEC) 10 MG tablet Take 10 mg by mouth daily.      . hydrochlorothiazide (HYDRODIURIL) 25 MG tablet Take 25 mg by mouth daily.      Marland Kitchen HYDROcodone-acetaminophen (NORCO/VICODIN) 5-325 MG per tablet Take 1 tablet by mouth 2 (two) times daily as needed. For pain      . lovastatin (MEVACOR) 40 MG tablet Take 80 mg by mouth daily. Take two tablets by mouth once daily      . nadolol (CORGARD) 20 MG tablet Take 20 mg by mouth daily.      . temazepam (RESTORIL) 30 MG capsule Take 30 mg by mouth at bedtime as needed. For sleep      . warfarin (COUMADIN) 2.5 MG tablet Take 2.5-3.75 mg by mouth daily. Take 3.75mg  (1&1/2 tablet) on Sun. Take 2.5mg  (1 tablet) all other days)  Scheduled:     . amiodarone  100 mg Oral Daily  . amLODipine  5 mg Oral Daily  . aspirin EC  81 mg Oral Daily  . atorvastatin  20 mg Oral q1800  . bisacodyl  5 mg Oral Once  . budesonide-formoterol  2 puff Inhalation BID  . cefUROXime (ZINACEF)  IV  1.5 g Intravenous To OR  . chlorhexidine  30 mL Topical UD  . citalopram  20 mg Oral Daily  . enalapril  10 mg Oral Daily  . hydrochlorothiazide  25 mg Oral Daily  . metoprolol tartrate  12.5 mg Oral Once  . nadolol  20 mg Oral Daily  . sodium chloride  3 mL Intravenous Q12H  . vancomycin  1,500 mg Intravenous To OR  . DISCONTD: chlorhexidine  60 mL Topical Once  . DISCONTD: metoprolol tartrate  12.5 mg Oral Once  . DISCONTD: simvastatin  40 mg Oral q1800  . DISCONTD: sodium chloride  3 mL Intravenous Q12H    Assessment: 56 yr old male on heparin for ACS/STEMI, s/p cath with anticipated surgery on 11/26/11.  Patient on coumadin PTA for  afib.    Heparin level is at goal.  No bleeding reported.   Goal of Therapy:  Heparin level 0.3-0.7 units/ml Monitor platelets by anticoagulation protocol: Yes   Plan:  1. Continue IV heparin at1300 units/hr. 2. Daily heparin level/cbc.   Wendie Simmer, PharmD, BCPS Clinical Pharmacist  Pager: [phone]

## 2011-11-25 NOTE — Progress Notes (Signed)
*  Preliminary Results*   Pre-op Cardiac Surgery  Carotid Findings:   No evidence of hemodynamically significant internal carotid artery stenosis bilaterally. Vertebral arteries are patent with antegrade flow.   Upper Extremity Right Left  Brachial Pressures 147-Triphasic 147-Triphasic  Radial Waveforms Triphasic Triphasic  Ulnar Waveforms Triphasic Monophasic  Palmar Arch (Allen's Test) Within normal limits Within normal limits    Findings:   Bilateral palpable pedal pulses. Refer to table above for additional doppler data.  11/25/2011 10:21 AM Gertie Fey, RDMS, RDCS

## 2011-11-25 NOTE — Progress Notes (Signed)
SUBJECTIVE: The patient is doing well today.  At this time, he denies chest pain, shortness of breath, or any new concerns.     Marland Kitchen amiodarone  100 mg Oral Daily  . amLODipine  5 mg Oral Daily  . aspirin EC  81 mg Oral Daily  . bisacodyl  5 mg Oral Once  . budesonide-formoterol  2 puff Inhalation BID  . cefUROXime (ZINACEF)  IV  1.5 g Intravenous To OR  . chlorhexidine  30 mL Topical UD  . citalopram  20 mg Oral Daily  . enalapril  10 mg Oral Daily  . fentaNYL      . heparin      . hydrochlorothiazide  25 mg Oral Daily  . lidocaine      . metoprolol tartrate  12.5 mg Oral Once  . midazolam      . nadolol  20 mg Oral Daily  . nitroGLYCERIN      . simvastatin  40 mg Oral q1800  . sodium chloride  3 mL Intravenous Q12H  . vancomycin  1,500 mg Intravenous To OR  . verapamil      . DISCONTD: chlorhexidine  60 mL Topical Once  . DISCONTD: metoprolol tartrate  12.5 mg Oral Once  . DISCONTD: sodium chloride  3 mL Intravenous Q12H      . sodium chloride    . heparin 1,300 Units/hr (11/24/11 2355)    OBJECTIVE: Physical Exam: Filed Vitals:   11/24/11 1400 11/24/11 2100 11/24/11 2140 11/25/11 0500  BP: 137/74 111/69  149/88  Pulse: 69 53 63 55  Temp: 97.9 F (36.6 C) 98.9 F (37.2 C)  98.1 F (36.7 C)  TempSrc: Oral Oral  Oral  Resp: 18 18 18 18   Height:      Weight:      SpO2: 98% 96% 95% 95%   No intake or output data in the 24 hours ending 11/25/11 0746  Telemetry reveals sinus rhythm  GEN- The patient is well appearing, alert and oriented x 3 today.   Head- normocephalic, atraumatic Eyes-  Sclera clear, conjunctiva pink Ears- hearing intact Oropharynx- clear Neck- supple, no JVP Lymph- no cervical lymphadenopathy Lungs- Clear to ausculation bilaterally, normal work of breathing Heart- Regular rate and rhythm, no murmurs, rubs or gallops, PMI not laterally displaced GI- soft, NT, ND, + BS Extremities- no clubbing, cyanosis, or edema Neuro- strength and  sensation are intact  LABS: Basic Metabolic Panel:  Basename 11/25/11 0622 11/24/11 1920  NA 140 141  K 3.7 4.2  CL 103 103  CO2 25 31  GLUCOSE 98 78  BUN 14 17  CREATININE 1.11 1.12  CALCIUM 9.4 9.4  MG -- --  PHOS -- --   Liver Function Tests:  [facility] 11/24/11 1920  AST 26  ALT 34  ALKPHOS 64  BILITOT 0.6  PROT 7.8  ALBUMIN 4.1   No results found for this basename: LIPASE:2,AMYLASE:2 in the last 72 hours CBC:  Basename 11/25/11 0622 11/24/11 0604  WBC 9.2 6.3  NEUTROABS -- --  HGB 15.5 14.5  HCT 44.2 41.3  MCV 91.9 92.2  PLT 192 166   Cardiac Enzymes:  Basename 11/25/11 0126 11/24/11 1920 11/24/11 1448  CKTOTAL -- -- --  CKMB -- -- --  CKMBINDEX -- -- --  TROPONINI <0.30 <0.30 <0.30   BNP: No components found with this basename: POCBNP:3 D-Dimer: No results found for this basename: DDIMER:2 in the last 72 hours Hemoglobin A1C:  Basename 11/24/11 1920  HGBA1C 5.6   Fasting Lipid Panel:  Basename 11/25/11 0622  CHOL 167  HDL 40  LDLCALC 102*  TRIG 126  CHOLHDL 4.2  LDLDIRECT --   Thyroid Function Tests: No results found for this basename: TSH,T4TOTAL,FREET3,T3FREE,THYROIDAB in the last 72 hours Anemia Panel: No results found for this basename: VITAMINB12,FOLATE,FERRITIN,TIBC,IRON,RETICCTPCT in the last 72 hours  RADIOLOGY: No results found.  ASSESSMENT AND PLAN:  Principal Problem:  *Paroxysmal atrial fibrillation Active Problems:  HYPOTHYROIDISM, ACQUIRED NEC  HYPERLIPIDEMIA  OBSTRUCTIVE SLEEP APNEA  HYPERTENSION  COPD  Chronic diastolic CHF (congestive heart failure)  Coronary artery disease  1. CAD- doing well s/p cath Plans for CABG are appropriate.  Occlusive CAD may be contributing to his chronic dyspnea Continue current medical therapy in the interim  2. Afib MAZE planned for tomorrow Presently on heparin drip and low dose amiodarone  No changes today   [name], MD 11/25/2011 7:46 AM

## 2011-11-25 NOTE — Care Management Note (Signed)
    Page 1 of 1   12/02/2011     3:24:40 PM   CARE MANAGEMENT NOTE 12/02/2011  Patient:  Scott [name], Scott [name]   Account Number:  [id number]  Date Initiated:  11/25/2011  Documentation initiated by:  GRAVES-BIGELOW,BRENDA  Subjective/Objective Assessment:   Pt admitted  for pre-op cardiac cath. Plan for CABG + maze on Wednesday. Pt is from home with a friend Brett Canales and he will provide care at d/c.     Action/Plan:   FC is following pt since no inusrance and CM will f/u after procedure to see if will need any HH services.   Anticipated DC Date:  12/01/2011   Anticipated DC Plan:  HOME W HOME HEALTH SERVICES  In-house referral  Financial Counselor      DC Planning Services  CM consult      Choice offered to / List presented to:             Status of service:  Completed, signed off Medicare Important Message given?   (If response is "NO", the following Medicare IM given date fields will be blank) Date Medicare IM given:   Date Additional Medicare IM given:    Discharge Disposition:  HOME/SELF CARE  Per UR Regulation:  Reviewed for med. necessity/level of care/duration of stay  If discussed at Long Length of Stay Meetings, dates discussed:    Comments:  12/02/11 Keina Mutch,RN,BSN [phone] PT FOR LIKELY DC IN AM S/P CABG AND MAZE.  PT DENIES NEED FOR RW; STATES FRIEND AND SISTER WILL PROVIDE CARE AT DC.  11/27/11 Teyon Odette,RN,BSN  [phone] PT S/P CABG X 1 AND MAZE PROCEDURE ON 11/26/11. PTA, PT INDEPENDENT, LIVES ALONE.  PT STATES HE IS NOT MARRIED AND HAS NO CHILDREN.  HE HAS ONE SISTER IN [place].  HE STATES HE HAS GOOD SUPPORT FROM FRIENDS TO ASSIST AT DC. WILL FOLLOW UP WITH PT FOR MORE DEFINITIVE DC PLAN.

## 2011-11-25 NOTE — Progress Notes (Signed)
ANTICOAGULATION CONSULT NOTE - Follow Up Consult  Pharmacy Consult for heparin Indication: Afib/ACS  Labs:  Basename 11/25/11 0622 11/25/11 0126 11/24/11 2247 11/24/11 1920 11/24/11 1448 11/24/11 0604  HGB 15.5 -- -- -- -- 14.5  HCT 44.2 -- -- -- -- 41.3  PLT 192 -- -- -- -- 166  APTT -- -- -- 34 32 --  LABPROT -- -- -- 16.3* -- 18.3*  INR -- -- -- 1.34 -- 1.57*  HEPARINUNFRC 0.38 -- 0.16* -- -- --  CREATININE 1.11 -- -- 1.12 -- 1.17  CKTOTAL -- -- -- -- -- --  CKMB -- -- -- -- -- --  TROPONINI -- <0.30 -- <0.30 <0.30 --    Assessment/Plan:  56yo male now therapeutic on heparin after rate increase.  Will continue gtt at current rate and confirm stable with additional level.  Colleen Can PharmD BCPS 11/25/2011,7:42 AM

## 2011-11-25 NOTE — Progress Notes (Signed)
   CARDIOTHORACIC SURGERY PROGRESS NOTE  1 Day Post-Op  S/P Procedure(s) (LRB): LEFT HEART CATHETERIZATION WITH CORONARY ANGIOGRAM (N/A)  Subjective: Scott [name] had an episode of "burning pain" in the chest and upper abdomen earlier today that lasted approximately 45 minutes and resolved.  He had some mild associated nausea and SOB.  EKG without clear ischemic changes.  Symptoms resolved completely and have not recurred.  Pain was not associated with meals or food.  Objective: Vital signs in last 24 hours: Temp:  [97.7 F (36.5 C)-100.2 F (37.9 C)] 100.2 F (37.9 C) (10/01 1611) Pulse Rate:  [53-63] 58  (10/01 1600) Cardiac Rhythm:  [-] Sinus bradycardia (10/01 0800) Resp:  [18-20] 20  (10/01 1600) BP: (111-166)/(69-97) 166/97 mmHg (10/01 1600) SpO2:  [95 %-100 %] 100 % (10/01 1600) Weight:  [88.451 kg (195 lb)] 88.451 kg (195 lb) (10/01 1500)  Physical Exam:  Rhythm:   sinus  Breath sounds: clear  Heart sounds:  RRR  Incisions:  n/a  Abdomen:  Soft and non tender  Extremities:  Warm, well perfused   Intake/Output from previous day:   Intake/Output this shift:    Lab Results:  Basename 11/25/11 1848 11/25/11 1635  WBC 10.3 9.2  HGB 16.4 15.9  HCT 46.1 45.8  PLT 193 200   BMET:  Basename 11/25/11 0622 11/24/11 1920  NA 140 141  K 3.7 4.2  CL 103 103  CO2 25 31  GLUCOSE 98 78  BUN 14 17  CREATININE 1.11 1.12  CALCIUM 9.4 9.4    CBG (last 3)  No results found for this basename: GLUCAP:3 in the last 72 hours PT/INR:   Basename 11/24/11 1920  LABPROT 16.3*  INR 1.34    CXR:  *RADIOLOGY REPORT*  Clinical Data: Precardiac surgery  CHEST - 2 VIEW  Comparison: 02/12/2010  Findings: Heart size is normal. No pleural effusion or edema.  Lungs are hyperinflated but clear. Mild coarsened interstitial  markings noted.  IMPRESSION:  1. No active cardiopulmonary abnormalities.  Original Report Authenticated By: Rosealee Albee, M.D.   Assessment/Plan: S/P  Procedure(s) (LRB): LEFT HEART CATHETERIZATION WITH CORONARY ANGIOGRAM (N/A)  One episode of burning pain in chest and upper abdomen earlier today of unclear etiology, but certainly might represent unstable angina related to tight proximal RCA stenosis.  No further symptoms nor other signs to suggest intraabdominal etiology nor retroperitoneal hematoma from cath.  I think we should proceed with surgery in am unless further problems arise.  The patient understands and accepts all potential associated risks of surgery including but not limited to risk of death, stroke, myocardial infarction, congestive heart failure, respiratory failure, renal failure, bleeding requiring blood transfusion and/or reexploration, arrhythmia, heart block or bradycardia requiring permanent pacemaker, pneumonia, pleural effusion, wound infection, pulmonary embolus or other thromboembolic complication, chronic pain or other delayed complications, or late recurrence of atrial fibrillation or atrial flutter.  All questions answered.   Scott [name] 11/25/2011 8:36 PM

## 2011-11-26 ENCOUNTER — Encounter (HOSPITAL_COMMUNITY)
Admission: RE | Disposition: A | Discharge status: Home / Self Care | Payer: Self-pay | Source: Ambulatory Visit | Attending: Thoracic Surgery (Cardiothoracic Vascular Surgery)

## 2011-11-26 ENCOUNTER — Encounter (HOSPITAL_COMMUNITY): Payer: Self-pay | Admitting: Anesthesiology

## 2011-11-26 ENCOUNTER — Inpatient Hospital Stay (HOSPITAL_COMMUNITY): Payer: Medicaid Other

## 2011-11-26 ENCOUNTER — Ambulatory Visit (HOSPITAL_COMMUNITY)
Admission: RE | Admit: 2011-11-26 | Payer: Self-pay | Source: Ambulatory Visit | Admitting: Thoracic Surgery (Cardiothoracic Vascular Surgery)

## 2011-11-26 ENCOUNTER — Encounter (HOSPITAL_COMMUNITY): Payer: Self-pay | Admitting: Thoracic Surgery (Cardiothoracic Vascular Surgery)

## 2011-11-26 ENCOUNTER — Inpatient Hospital Stay (HOSPITAL_COMMUNITY): Payer: Medicaid Other | Admitting: Anesthesiology

## 2011-11-26 DIAGNOSIS — Z8679 Personal history of other diseases of the circulatory system: Secondary | ICD-10-CM

## 2011-11-26 DIAGNOSIS — I2510 Atherosclerotic heart disease of native coronary artery without angina pectoris: Secondary | ICD-10-CM

## 2011-11-26 DIAGNOSIS — Z951 Presence of aortocoronary bypass graft: Secondary | ICD-10-CM

## 2011-11-26 DIAGNOSIS — I4891 Unspecified atrial fibrillation: Secondary | ICD-10-CM

## 2011-11-26 HISTORY — DX: Personal history of other diseases of the circulatory system: Z86.79

## 2011-11-26 HISTORY — PX: CORONARY ARTERY BYPASS GRAFT: SHX141

## 2011-11-26 HISTORY — DX: Presence of aortocoronary bypass graft: Z95.1

## 2011-11-26 HISTORY — PX: MAZE: SHX5063

## 2011-11-26 LAB — POCT I-STAT 4, (NA,K, GLUC, HGB,HCT)
Glucose, Bld: 101 mg/dL — ABNORMAL HIGH (ref 70–99)
Glucose, Bld: 105 mg/dL — ABNORMAL HIGH (ref 70–99)
HCT: 39 % (ref 39.0–52.0)
HCT: 26 % — ABNORMAL LOW (ref 39.0–52.0)
HCT: 32 % — ABNORMAL LOW (ref 39.0–52.0)
Hemoglobin: 8.8 g/dL — ABNORMAL LOW (ref 13.0–17.0)
Hemoglobin: 8.8 g/dL — ABNORMAL LOW (ref 13.0–17.0)
Potassium: 3.6 mEq/L (ref 3.5–5.1)
Potassium: 4.7 mEq/L (ref 3.5–5.1)
Potassium: 4.3 mEq/L (ref 3.5–5.1)
Sodium: 135 mEq/L (ref 135–145)
Sodium: 140 mEq/L (ref 135–145)
Sodium: 140 mEq/L (ref 135–145)

## 2011-11-26 LAB — POCT I-STAT 3, ART BLOOD GAS (G3+)
Acid-base deficit: 1 mmol/L (ref 0.0–2.0)
Acid-base deficit: 3 mmol/L — ABNORMAL HIGH (ref 0.0–2.0)
Acid-base deficit: 3 mmol/L — ABNORMAL HIGH (ref 0.0–2.0)
Acid-base deficit: 2 mmol/L (ref 0.0–2.0)
Bicarbonate: 23 mEq/L (ref 20.0–24.0)
Bicarbonate: 22.5 mEq/L (ref 20.0–24.0)
O2 Saturation: 100 %
O2 Saturation: 95 %
O2 Saturation: 93 %
Patient temperature: 36.4
TCO2: 25 mmol/L (ref 0–100)
TCO2: 23 mmol/L (ref 0–100)
TCO2: 24 mmol/L (ref 0–100)
pCO2 arterial: 36.1 mmHg (ref 35.0–45.0)
pCO2 arterial: 39.5 mmHg (ref 35.0–45.0)
pCO2 arterial: 37.4 mmHg (ref 35.0–45.0)
pH, Arterial: 7.344 — ABNORMAL LOW (ref 7.350–7.450)
pO2, Arterial: 277 mmHg — ABNORMAL HIGH (ref 80.0–100.0)
pO2, Arterial: 81 mmHg (ref 80.0–100.0)
pO2, Arterial: 66 mmHg — ABNORMAL LOW (ref 80.0–100.0)

## 2011-11-26 LAB — POCT I-STAT, CHEM 8
BUN: 16 mg/dL (ref 6–23)
Calcium, Ion: 1.12 mmol/L (ref 1.12–1.23)
Chloride: 110 mEq/L (ref 96–112)
Creatinine, Ser: 1.2 mg/dL (ref 0.50–1.35)
Glucose, Bld: 99 mg/dL (ref 70–99)
HCT: 28 % — ABNORMAL LOW (ref 39.0–52.0)
Potassium: 4.3 mEq/L (ref 3.5–5.1)

## 2011-11-26 LAB — URINALYSIS, ROUTINE W REFLEX MICROSCOPIC
Glucose, UA: NEGATIVE mg/dL
Ketones, ur: 15 mg/dL — AB
Leukocytes, UA: NEGATIVE
Protein, ur: NEGATIVE mg/dL
Urobilinogen, UA: 0.2 mg/dL (ref 0.0–1.0)

## 2011-11-26 LAB — SURGICAL PCR SCREEN: MRSA, PCR: NEGATIVE

## 2011-11-26 LAB — CREATININE, SERUM: GFR calc non Af Amer: 76 mL/min — ABNORMAL LOW (ref >90)

## 2011-11-26 LAB — BASIC METABOLIC PANEL
Chloride: 104 mEq/L (ref 96–112)
Creatinine, Ser: 1.42 mg/dL — ABNORMAL HIGH (ref 0.50–1.35)
GFR calc Af Amer: 62 mL/min — ABNORMAL LOW (ref >90)
Potassium: 3.8 mEq/L (ref 3.5–5.1)

## 2011-11-26 LAB — CBC
HCT: 28.3 % — ABNORMAL LOW (ref 39.0–52.0)
MCHC: 35 g/dL (ref 30.0–36.0)
MCV: 91.9 fL (ref 78.0–100.0)
Platelets: 172 10*3/uL (ref 150–400)
Platelets: 115 10*3/uL — ABNORMAL LOW (ref 150–400)
Platelets: 127 10*3/uL — ABNORMAL LOW (ref 150–400)
RBC: 3.34 MIL/uL — ABNORMAL LOW (ref 4.22–5.81)
RDW: 12.9 % (ref 11.5–15.5)
RDW: 13 % (ref 11.5–15.5)
RDW: 13.2 % (ref 11.5–15.5)
WBC: 8 10*3/uL (ref 4.0–10.5)
WBC: 11.2 10*3/uL — ABNORMAL HIGH (ref 4.0–10.5)
WBC: 13.1 10*3/uL — ABNORMAL HIGH (ref 4.0–10.5)

## 2011-11-26 LAB — GLUCOSE, CAPILLARY
Glucose-Capillary: 102 mg/dL — ABNORMAL HIGH (ref 70–99)
Glucose-Capillary: 100 mg/dL — ABNORMAL HIGH (ref 70–99)
Glucose-Capillary: 97 mg/dL (ref 70–99)

## 2011-11-26 LAB — HEMOGLOBIN AND HEMATOCRIT, BLOOD
HCT: 30.4 % — ABNORMAL LOW (ref 39.0–52.0)
Hemoglobin: 10.7 g/dL — ABNORMAL LOW (ref 13.0–17.0)

## 2011-11-26 LAB — PROTIME-INR
INR: 1.69 — ABNORMAL HIGH (ref 0.00–1.49)
Prothrombin Time: 19.3 seconds — ABNORMAL HIGH (ref 11.6–15.2)

## 2011-11-26 LAB — APTT: aPTT: 32 seconds (ref 24–37)

## 2011-11-26 SURGERY — COX MAZE PROCEDURE
Anesthesia: General | Site: Chest | Wound class: Clean

## 2011-11-26 MED ORDER — ACETAMINOPHEN 650 MG RE SUPP: 650.0000 mg | RECTAL | Status: AC

## 2011-11-26 MED ORDER — DEXMEDETOMIDINE HCL IN NACL 200 MCG/50ML IV SOLN
0.1000 ug/kg/h | INTRAVENOUS | Status: DC
  Administered 2011-11-26: 0.2 ug/kg/h via INTRAVENOUS
  Filled 2011-11-26 (×2): qty 50

## 2011-11-26 MED ORDER — SODIUM CHLORIDE 0.9 % IJ SOLN
3.0000 mL | INTRAMUSCULAR | Status: DC | PRN
  Administered 2011-11-27: 3 mL via INTRAVENOUS

## 2011-11-26 MED ORDER — LACTATED RINGERS IV SOLN
INTRAVENOUS | Status: DC | PRN
  Administered 2011-11-26 (×2): via INTRAVENOUS

## 2011-11-26 MED ORDER — LACTATED RINGERS IV SOLN
INTRAVENOUS | Status: DC | PRN
  Administered 2011-11-26 (×2): via INTRAVENOUS

## 2011-11-26 MED ORDER — GLYCOPYRROLATE 0.2 MG/ML IJ SOLN
INTRAMUSCULAR | Status: DC | PRN
  Administered 2011-11-26: .1 mg via INTRAVENOUS

## 2011-11-26 MED ORDER — DEXTROSE 5 % IV SOLN
1.5000 g | INTRAVENOUS | Status: DC | PRN
  Administered 2011-11-26: 1.5 g via INTRAVENOUS

## 2011-11-26 MED ORDER — ESMOLOL HCL 10 MG/ML IV SOLN
INTRAVENOUS | Status: DC | PRN
  Administered 2011-11-26: 5 mg via INTRAVENOUS

## 2011-11-26 MED ORDER — LACTATED RINGERS IV SOLN
INTRAVENOUS | Status: DC
  Administered 2011-11-26: 20 mL/h via INTRAVENOUS

## 2011-11-26 MED ORDER — PHENYLEPHRINE HCL 10 MG/ML IJ SOLN
20.0000 mg | INTRAMUSCULAR | Status: DC | PRN
  Administered 2011-11-26: 8 ug/min via INTRAVENOUS

## 2011-11-26 MED ORDER — ACETAMINOPHEN 500 MG PO TABS
1000.0000 mg | ORAL_TABLET | Freq: Four times a day (QID) | ORAL | Status: AC
  Administered 2011-11-26 – 2011-12-01 (×18): 1000 mg via ORAL
  Filled 2011-11-26 (×17): qty 2
  Filled 2011-11-26: qty 1
  Filled 2011-11-26 (×2): qty 2

## 2011-11-26 MED ORDER — POTASSIUM CHLORIDE 10 MEQ/50ML IV SOLN: 10.0000 meq | INTRAVENOUS | Status: AC

## 2011-11-26 MED ORDER — PANTOPRAZOLE SODIUM 40 MG PO TBEC
40.0000 mg | DELAYED_RELEASE_TABLET | Freq: Every day | ORAL | Status: DC
  Administered 2011-11-28 – 2011-12-03 (×6): 40 mg via ORAL
  Filled 2011-11-26 (×6): qty 1

## 2011-11-26 MED ORDER — BISACODYL 5 MG PO TBEC
10.0000 mg | DELAYED_RELEASE_TABLET | Freq: Every day | ORAL | Status: DC
  Administered 2011-11-27 – 2011-12-03 (×5): 10 mg via ORAL
  Filled 2011-11-26 (×5): qty 2

## 2011-11-26 MED ORDER — ACETAMINOPHEN 160 MG/5ML PO SOLN: 650.0000 mg | ORAL | Status: AC

## 2011-11-26 MED ORDER — PLASMA-LYTE 148 IV SOLN
INTRAVENOUS | Status: DC | PRN
  Administered 2011-11-26: 11:00:00

## 2011-11-26 MED ORDER — OXYCODONE HCL 5 MG PO TABS
5.0000 mg | ORAL_TABLET | ORAL | Status: DC | PRN
  Administered 2011-11-27 – 2011-12-02 (×12): 10 mg via ORAL
  Filled 2011-11-26 (×12): qty 2

## 2011-11-26 MED ORDER — SODIUM CHLORIDE 0.9 % IV SOLN
100.0000 [IU] | INTRAVENOUS | Status: DC | PRN
  Administered 2011-11-26: 1 [IU]/h via INTRAVENOUS

## 2011-11-26 MED ORDER — ASPIRIN 81 MG PO CHEW: 324.0000 mg | CHEWABLE_TABLET | Freq: Every day | ORAL | Status: DC

## 2011-11-26 MED ORDER — SODIUM CHLORIDE 0.9 % IV SOLN
INTRAVENOUS | Status: DC
  Administered 2011-11-26: 20 mL/h via INTRAVENOUS

## 2011-11-26 MED ORDER — INSULIN REGULAR BOLUS VIA INFUSION
0.0000 [IU] | Freq: Three times a day (TID) | INTRAVENOUS | Status: DC
  Filled 2011-11-26: qty 10

## 2011-11-26 MED ORDER — METOPROLOL TARTRATE 25 MG/10 ML ORAL SUSPENSION
12.5000 mg | Freq: Two times a day (BID) | ORAL | Status: DC
  Filled 2011-11-26 (×3): qty 5

## 2011-11-26 MED ORDER — MIDAZOLAM HCL 2 MG/2ML IJ SOLN: 2.0000 mg | INTRAMUSCULAR | Status: DC | PRN

## 2011-11-26 MED ORDER — MORPHINE SULFATE 2 MG/ML IJ SOLN
2.0000 mg | INTRAMUSCULAR | Status: DC | PRN
  Filled 2011-11-26: qty 1

## 2011-11-26 MED ORDER — ALBUMIN HUMAN 5 % IV SOLN
INTRAVENOUS | Status: DC | PRN
  Administered 2011-11-26: 13:00:00 via INTRAVENOUS

## 2011-11-26 MED ORDER — MORPHINE SULFATE 2 MG/ML IJ SOLN
1.0000 mg | INTRAMUSCULAR | Status: AC | PRN
  Administered 2011-11-26: 2 mg via INTRAVENOUS
  Administered 2011-11-26: 1 mg via INTRAVENOUS
  Administered 2011-11-26: 2 mg via INTRAVENOUS
  Administered 2011-11-26: 1 mg via INTRAVENOUS
  Administered 2011-11-26 – 2011-11-27 (×2): 2 mg via INTRAVENOUS
  Filled 2011-11-26 (×4): qty 1

## 2011-11-26 MED ORDER — DOCUSATE SODIUM 100 MG PO CAPS
200.0000 mg | ORAL_CAPSULE | Freq: Every day | ORAL | Status: DC
  Administered 2011-11-27 – 2011-12-02 (×6): 200 mg via ORAL
  Filled 2011-11-26 (×7): qty 2

## 2011-11-26 MED ORDER — ROCURONIUM BROMIDE 100 MG/10ML IV SOLN
INTRAVENOUS | Status: DC | PRN
  Administered 2011-11-26: 50 mg via INTRAVENOUS

## 2011-11-26 MED ORDER — METOPROLOL TARTRATE 1 MG/ML IV SOLN: 2.5000 mg | INTRAVENOUS | Status: DC | PRN

## 2011-11-26 MED ORDER — VECURONIUM BROMIDE 10 MG IV SOLR
INTRAVENOUS | Status: DC | PRN
  Administered 2011-11-26 (×2): 5 mg via INTRAVENOUS
  Administered 2011-11-26: 10 mg via INTRAVENOUS
  Administered 2011-11-26: 5 mg via INTRAVENOUS

## 2011-11-26 MED ORDER — FAMOTIDINE IN NACL 20-0.9 MG/50ML-% IV SOLN
20.0000 mg | Freq: Two times a day (BID) | INTRAVENOUS | Status: AC
  Administered 2011-11-27: 20 mg via INTRAVENOUS
  Filled 2011-11-26: qty 50

## 2011-11-26 MED ORDER — BISACODYL 10 MG RE SUPP: 10.0000 mg | Freq: Every day | RECTAL | Status: DC

## 2011-11-26 MED ORDER — SODIUM CHLORIDE 0.9 % IV SOLN: 250.0000 mL | INTRAVENOUS | Status: DC

## 2011-11-26 MED ORDER — SODIUM CHLORIDE 0.9 % IV SOLN
INTRAVENOUS | Status: DC
  Filled 2011-11-26: qty 1

## 2011-11-26 MED ORDER — SODIUM CHLORIDE 0.9 % IJ SOLN
OROMUCOSAL | Status: DC | PRN
  Administered 2011-11-26 (×2): via TOPICAL

## 2011-11-26 MED ORDER — ACETAMINOPHEN 160 MG/5ML PO SOLN: 975.0000 mg | Freq: Four times a day (QID) | ORAL | Status: DC

## 2011-11-26 MED ORDER — SODIUM CHLORIDE 0.9 % IV SOLN
200.0000 ug | INTRAVENOUS | Status: DC | PRN
  Administered 2011-11-26: 0.2 ug/kg/h via INTRAVENOUS

## 2011-11-26 MED ORDER — MAGNESIUM SULFATE 40 MG/ML IJ SOLN
4.0000 g | Freq: Once | INTRAMUSCULAR | Status: AC
  Administered 2011-11-26: 4 g via INTRAVENOUS
  Filled 2011-11-26: qty 100

## 2011-11-26 MED ORDER — DEXTROSE 5 % IV SOLN
1.5000 g | Freq: Two times a day (BID) | INTRAVENOUS | Status: DC
  Administered 2011-11-26 – 2011-11-27 (×3): 1.5 g via INTRAVENOUS
  Filled 2011-11-26 (×4): qty 1.5

## 2011-11-26 MED ORDER — ALBUMIN HUMAN 5 % IV SOLN
250.0000 mL | INTRAVENOUS | Status: AC | PRN
  Administered 2011-11-26 – 2011-11-27 (×4): 250 mL via INTRAVENOUS
  Filled 2011-11-26: qty 500

## 2011-11-26 MED ORDER — SODIUM CHLORIDE 0.9 % IJ SOLN
OROMUCOSAL | Status: DC | PRN
  Administered 2011-11-26: 14:00:00 via TOPICAL

## 2011-11-26 MED ORDER — VANCOMYCIN HCL IN DEXTROSE 1-5 GM/200ML-% IV SOLN
1000.0000 mg | Freq: Once | INTRAVENOUS | Status: AC
  Administered 2011-11-26: 1000 mg via INTRAVENOUS
  Filled 2011-11-26: qty 200

## 2011-11-26 MED ORDER — MIDAZOLAM HCL 5 MG/5ML IJ SOLN
INTRAMUSCULAR | Status: DC | PRN
  Administered 2011-11-26 (×3): 2 mg via INTRAVENOUS
  Administered 2011-11-26: 5 mg via INTRAVENOUS
  Administered 2011-11-26: 1 mg via INTRAVENOUS
  Administered 2011-11-26 (×2): 2 mg via INTRAVENOUS
  Administered 2011-11-26: 3 mg via INTRAVENOUS
  Administered 2011-11-26: 1 mg via INTRAVENOUS

## 2011-11-26 MED ORDER — PROTAMINE SULFATE 10 MG/ML IV SOLN
INTRAVENOUS | Status: DC | PRN
  Administered 2011-11-26: 250 mg via INTRAVENOUS

## 2011-11-26 MED ORDER — SODIUM CHLORIDE 0.45 % IV SOLN
INTRAVENOUS | Status: DC
  Administered 2011-11-26: 20 mL/h via INTRAVENOUS

## 2011-11-26 MED ORDER — SODIUM CHLORIDE 0.9 % IV SOLN
10.0000 g | INTRAVENOUS | Status: DC | PRN
  Administered 2011-11-26: 5 g/h via INTRAVENOUS

## 2011-11-26 MED ORDER — NITROGLYCERIN IN D5W 200-5 MCG/ML-% IV SOLN: 0.0000 ug/min | INTRAVENOUS | Status: DC

## 2011-11-26 MED ORDER — PAPAVERINE HCL 30 MG/ML IJ SOLN: INTRAMUSCULAR | Status: DC

## 2011-11-26 MED ORDER — FENTANYL CITRATE 0.05 MG/ML IJ SOLN
INTRAMUSCULAR | Status: DC | PRN
  Administered 2011-11-26 (×2): 50 ug via INTRAVENOUS
  Administered 2011-11-26: 100 ug via INTRAVENOUS
  Administered 2011-11-26 (×3): 250 ug via INTRAVENOUS
  Administered 2011-11-26: 50 ug via INTRAVENOUS
  Administered 2011-11-26 (×2): 250 ug via INTRAVENOUS

## 2011-11-26 MED ORDER — LACTATED RINGERS IV SOLN
INTRAVENOUS | Status: DC | PRN
  Administered 2011-11-26 (×2): via INTRAVENOUS

## 2011-11-26 MED ORDER — NITROGLYCERIN IN D5W 200-5 MCG/ML-% IV SOLN
INTRAVENOUS | Status: DC | PRN
  Administered 2011-11-26: 3 ug/min via INTRAVENOUS

## 2011-11-26 MED ORDER — ASPIRIN EC 325 MG PO TBEC
325.0000 mg | DELAYED_RELEASE_TABLET | Freq: Every day | ORAL | Status: DC
  Administered 2011-11-27: 325 mg via ORAL
  Filled 2011-11-26 (×2): qty 1

## 2011-11-26 MED ORDER — LIDOCAINE HCL (CARDIAC) 20 MG/ML IV SOLN
INTRAVENOUS | Status: DC | PRN
  Administered 2011-11-26: 60 mg via INTRAVENOUS

## 2011-11-26 MED ORDER — SODIUM CHLORIDE 0.9 % IJ SOLN
3.0000 mL | Freq: Two times a day (BID) | INTRAMUSCULAR | Status: DC
  Administered 2011-11-27 (×2): 3 mL via INTRAVENOUS

## 2011-11-26 MED ORDER — PHENYLEPHRINE HCL 10 MG/ML IJ SOLN
0.0000 ug/min | INTRAVENOUS | Status: DC
  Filled 2011-11-26 (×2): qty 2

## 2011-11-26 MED ORDER — CALCIUM CHLORIDE 10 % IV SOLN
1.0000 g | Freq: Once | INTRAVENOUS | Status: AC | PRN
  Filled 2011-11-26: qty 10

## 2011-11-26 MED ORDER — HEPARIN SODIUM (PORCINE) 1000 UNIT/ML IJ SOLN
INTRAMUSCULAR | Status: DC | PRN
  Administered 2011-11-26: 2000 [IU] via INTRAVENOUS
  Administered 2011-11-26: 28000 [IU] via INTRAVENOUS

## 2011-11-26 MED ORDER — METOPROLOL TARTRATE 12.5 MG HALF TABLET
12.5000 mg | ORAL_TABLET | Freq: Two times a day (BID) | ORAL | Status: DC
  Filled 2011-11-26 (×3): qty 1

## 2011-11-26 MED ORDER — PROPOFOL 10 MG/ML IV BOLUS
INTRAVENOUS | Status: DC | PRN
  Administered 2011-11-26: 100 mg via INTRAVENOUS

## 2011-11-26 MED ORDER — ONDANSETRON HCL 4 MG/2ML IJ SOLN
4.0000 mg | Freq: Four times a day (QID) | INTRAMUSCULAR | Status: DC | PRN
  Administered 2011-11-26 – 2011-11-28 (×3): 4 mg via INTRAVENOUS
  Filled 2011-11-26 (×3): qty 2

## 2011-11-26 SURGICAL SUPPLY — 145 items
ADAPTER CARDIO PERF ANTE/RETRO (ADAPTER) ×2 IMPLANT
ADPR PRFSN 84XANTGRD RTRGD (ADAPTER) ×1
APL SKNCLS STERI-STRIP NONHPOA (GAUZE/BANDAGES/DRESSINGS) ×1
APPLIER CLIP 9.375 MED OPEN (MISCELLANEOUS)
APPLIER CLIP 9.375 SM OPEN (CLIP)
APR CLP MED 9.3 20 MLT OPN (MISCELLANEOUS)
APR CLP SM 9.3 20 MLT OPN (CLIP)
ATTRACTOMAT 16X20 MAGNETIC DRP (DRAPES) ×4 IMPLANT
BAG DECANTER FOR FLEXI CONT (MISCELLANEOUS) ×4 IMPLANT
BANDAGE ELASTIC 4 VELCRO ST LF (GAUZE/BANDAGES/DRESSINGS) ×2 IMPLANT
BANDAGE ELASTIC 6 VELCRO ST LF (GAUZE/BANDAGES/DRESSINGS) ×2 IMPLANT
BANDAGE GAUZE ELAST BULKY 4 IN (GAUZE/BANDAGES/DRESSINGS) ×2 IMPLANT
BASKET HEART (ORDER IN 25'S) (MISCELLANEOUS) ×1
BASKET HEART (ORDER IN 25S) (MISCELLANEOUS) ×1 IMPLANT
BENZOIN TINCTURE PRP APPL 2/3 (GAUZE/BANDAGES/DRESSINGS) ×2 IMPLANT
BLADE STERNUM SYSTEM 6 (BLADE) ×4 IMPLANT
BLADE SURG 11 STRL SS (BLADE) ×2 IMPLANT
BLADE SURG ROTATE 9660 (MISCELLANEOUS) IMPLANT
CANISTER SUCTION 2500CC (MISCELLANEOUS) ×4 IMPLANT
CANN PRFSN 3/8X14X24FR PCFC (MISCELLANEOUS)
CANN PRFSN 3/8XCNCT ST RT ANG (MISCELLANEOUS)
CANNULA GUNDRY RCSP 15FR (MISCELLANEOUS) ×2 IMPLANT
CANNULA PRFSN 3/8X14X24FR PCFC (MISCELLANEOUS) IMPLANT
CANNULA PRFSN 3/8XCNCT RT ANG (MISCELLANEOUS) IMPLANT
CANNULA VEN MTL TIP RT (MISCELLANEOUS)
CANNULA VENNOUS METAL TIP 20FR (CANNULA) ×1 IMPLANT
CARDIOBLATE CARDIAC ABLATION (MISCELLANEOUS) ×2
CATH CPB KIT OWEN (MISCELLANEOUS) ×2 IMPLANT
CATH HEART VENT LEFT (CATHETERS) IMPLANT
CATH ROBINSON RED A/P 18FR (CATHETERS) ×1 IMPLANT
CATH THORACIC 28FR (CATHETERS) IMPLANT
CATH THORACIC 28FR RT ANG (CATHETERS) IMPLANT
CATH THORACIC 36FR (CATHETERS) ×2 IMPLANT
CATH THORACIC 36FR RT ANG (CATHETERS) ×2 IMPLANT
CLAMP ISOLATOR OLL2 ×1 IMPLANT
CLIP APPLIE 9.375 MED OPEN (MISCELLANEOUS) IMPLANT
CLIP APPLIE 9.375 SM OPEN (CLIP) IMPLANT
CLIP FOGARTY SPRING 6M (CLIP) IMPLANT
CLIP TI MEDIUM 24 (CLIP) IMPLANT
CLIP TI WIDE RED SMALL 24 (CLIP) IMPLANT
CLOTH BEACON ORANGE TIMEOUT ST (SAFETY) ×4 IMPLANT
CONN 1/2X1/2X1/2  BEN (MISCELLANEOUS) ×2
CONN 1/2X1/2X1/2 BEN (MISCELLANEOUS) ×1 IMPLANT
CONN 3/8X1/2 ST GISH (MISCELLANEOUS) ×6 IMPLANT
CONN Y 3/8X3/8X3/8  BEN (MISCELLANEOUS)
CONN Y 3/8X3/8X3/8 BEN (MISCELLANEOUS) IMPLANT
COVER SURGICAL LIGHT HANDLE (MISCELLANEOUS) ×6 IMPLANT
CRADLE DONUT ADULT HEAD (MISCELLANEOUS) ×4 IMPLANT
DEVICE CARDIOBLATE CARDIAC ABL (MISCELLANEOUS) IMPLANT
DRAIN CHANNEL 32F RND 10.7 FF (WOUND CARE) ×4 IMPLANT
DRAPE CARDIOVASCULAR INCISE (DRAPES) ×2
DRAPE SLUSH/WARMER DISC (DRAPES) ×4 IMPLANT
DRAPE SRG 135X102X78XABS (DRAPES) ×1 IMPLANT
DRSG COVADERM 4X14 (GAUZE/BANDAGES/DRESSINGS) ×3 IMPLANT
ELECT REM PT RETURN 9FT ADLT (ELECTROSURGICAL) ×8
ELECTRODE REM PT RTRN 9FT ADLT (ELECTROSURGICAL) ×4 IMPLANT
GLOVE BIO SURGEON STRL SZ 6 (GLOVE) ×3 IMPLANT
GLOVE BIO SURGEON STRL SZ 6.5 (GLOVE) ×4 IMPLANT
GLOVE BIO SURGEON STRL SZ7 (GLOVE) IMPLANT
GLOVE BIO SURGEON STRL SZ7.5 (GLOVE) IMPLANT
GLOVE BIOGEL PI IND STRL 6 (GLOVE) IMPLANT
GLOVE BIOGEL PI IND STRL 6.5 (GLOVE) IMPLANT
GLOVE BIOGEL PI IND STRL 7.0 (GLOVE) IMPLANT
GLOVE BIOGEL PI INDICATOR 6 (GLOVE)
GLOVE BIOGEL PI INDICATOR 6.5 (GLOVE)
GLOVE BIOGEL PI INDICATOR 7.0 (GLOVE) ×8
GLOVE EUDERMIC 7 POWDERFREE (GLOVE) IMPLANT
GLOVE ORTHO TXT STRL SZ7.5 (GLOVE) ×6 IMPLANT
GOWN STRL NON-REIN LRG LVL3 (GOWN DISPOSABLE) ×20 IMPLANT
HEMOSTAT POWDER SURGIFOAM 1G (HEMOSTASIS) ×12 IMPLANT
INSERT FOGARTY 61MM (MISCELLANEOUS) IMPLANT
INSERT FOGARTY XLG (MISCELLANEOUS) ×4 IMPLANT
KIT BASIN OR (CUSTOM PROCEDURE TRAY) ×4 IMPLANT
KIT DILATOR VASC 18G NDL (KITS) ×1 IMPLANT
KIT DRAINAGE VACCUM ASSIST (KITS) ×1 IMPLANT
KIT ROOM TURNOVER OR (KITS) ×4 IMPLANT
KIT SUCTION CATH 14FR (SUCTIONS) ×16 IMPLANT
KIT VASOVIEW W/TROCAR VH 2000 (KITS) ×2 IMPLANT
LEAD PACING MYOCARDI (MISCELLANEOUS) ×2 IMPLANT
LINE VENT (MISCELLANEOUS) ×1 IMPLANT
LOOP VESSEL SUPERMAXI WHITE (MISCELLANEOUS) ×3 IMPLANT
MARKER GRAFT CORONARY BYPASS (MISCELLANEOUS) ×6 IMPLANT
NS IRRIG 1000ML POUR BTL (IV SOLUTION) ×20 IMPLANT
PACK OPEN HEART (CUSTOM PROCEDURE TRAY) ×4 IMPLANT
PAD ARMBOARD 7.5X6 YLW CONV (MISCELLANEOUS) ×6 IMPLANT
PENCIL BUTTON HOLSTER BLD 10FT (ELECTRODE) ×2 IMPLANT
PROBE CRYO2-ABLATION MALLABLE (MISCELLANEOUS) ×2 IMPLANT
PUNCH AORTIC ROTATE 4.0MM (MISCELLANEOUS) IMPLANT
PUNCH AORTIC ROTATE 4.5MM 8IN (MISCELLANEOUS) IMPLANT
PUNCH AORTIC ROTATE 5MM 8IN (MISCELLANEOUS) IMPLANT
SET CARDIOPLEGIA MPS 5001102 (MISCELLANEOUS) ×1 IMPLANT
SET IRRIG TUBING LAPAROSCOPIC (IRRIGATION / IRRIGATOR) ×2 IMPLANT
SOLUTION ANTI FOG 6CC (MISCELLANEOUS) IMPLANT
SPONGE GAUZE 4X4 12PLY (GAUZE/BANDAGES/DRESSINGS) ×5 IMPLANT
SPONGE LAP 18X18 X RAY DECT (DISPOSABLE) IMPLANT
SPONGE LAP 4X18 X RAY DECT (DISPOSABLE) ×1 IMPLANT
SUCKER INTRACARDIAC WEIGHTED (SUCKER) ×2 IMPLANT
SUT BONE WAX W31G (SUTURE) ×4 IMPLANT
SUT ETHIBOND 2 0 SH (SUTURE) ×4
SUT ETHIBOND 2 0 SH 36X2 (SUTURE) ×2 IMPLANT
SUT ETHIBOND X763 2 0 SH 1 (SUTURE) ×9 IMPLANT
SUT MNCRL AB 3-0 PS2 18 (SUTURE) ×8 IMPLANT
SUT MNCRL AB 4-0 PS2 18 (SUTURE) IMPLANT
SUT PDS AB 1 CTX 36 (SUTURE) ×8 IMPLANT
SUT PROLENE 2 0 SH DA (SUTURE) IMPLANT
SUT PROLENE 3 0 SH 1 (SUTURE) ×2 IMPLANT
SUT PROLENE 3 0 SH DA (SUTURE) ×2 IMPLANT
SUT PROLENE 3 0 SH1 36 (SUTURE) ×3 IMPLANT
SUT PROLENE 4 0 RB 1 (SUTURE) ×6
SUT PROLENE 4 0 SH DA (SUTURE) ×4 IMPLANT
SUT PROLENE 4-0 RB1 .5 CRCL 36 (SUTURE) ×2 IMPLANT
SUT PROLENE 5 0 C 1 36 (SUTURE) IMPLANT
SUT PROLENE 6 0 C 1 30 (SUTURE) IMPLANT
SUT PROLENE 7.0 RB 3 (SUTURE) ×6 IMPLANT
SUT PROLENE 8 0 BV175 6 (SUTURE) IMPLANT
SUT PROLENE BLUE 7 0 (SUTURE) ×2 IMPLANT
SUT PROLENE POLY MONO (SUTURE) IMPLANT
SUT SILK  1 MH (SUTURE) ×3
SUT SILK 1 MH (SUTURE) ×2 IMPLANT
SUT STEEL 6MS V (SUTURE) IMPLANT
SUT STEEL STERNAL CCS#1 18IN (SUTURE) ×1 IMPLANT
SUT STEEL SZ 6 DBL 3X14 BALL (SUTURE) ×2 IMPLANT
SUT VIC AB 1 CTX 36 (SUTURE)
SUT VIC AB 1 CTX36XBRD ANBCTR (SUTURE) IMPLANT
SUT VIC AB 2-0 CT1 27 (SUTURE)
SUT VIC AB 2-0 CT1 TAPERPNT 27 (SUTURE) IMPLANT
SUT VIC AB 2-0 CTX 27 (SUTURE) IMPLANT
SUT VIC AB 2-0 UR6 27 (SUTURE) ×1 IMPLANT
SUT VIC AB 3-0 SH 27 (SUTURE)
SUT VIC AB 3-0 SH 27X BRD (SUTURE) IMPLANT
SUT VIC AB 3-0 X1 27 (SUTURE) IMPLANT
SUT VICRYL 4-0 PS2 18IN ABS (SUTURE) IMPLANT
SUTURE E-PAK OPEN HEART (SUTURE) ×2 IMPLANT
SYS ATRICLIP LAA EXCLUSION 45 (CLIP) IMPLANT
SYS ATRICLIP LAA EXCLUSION 50 (Clip) ×1 IMPLANT
SYSTEM SAHARA CHEST DRAIN ATS (WOUND CARE) ×4 IMPLANT
TAPE CLOTH SURG 4X10 WHT LF (GAUZE/BANDAGES/DRESSINGS) ×1 IMPLANT
TOWEL OR 17X24 6PK STRL BLUE (TOWEL DISPOSABLE) ×8 IMPLANT
TOWEL OR 17X26 10 PK STRL BLUE (TOWEL DISPOSABLE) ×8 IMPLANT
TRAY FOLEY IC TEMP SENS 14FR (CATHETERS) ×4 IMPLANT
TUBE SUCT INTRACARD DLP 20F (MISCELLANEOUS) ×2 IMPLANT
TUBING INSUFFLATION 10FT LAP (TUBING) ×2 IMPLANT
UNDERPAD 30X30 INCONTINENT (UNDERPADS AND DIAPERS) ×4 IMPLANT
VENT LEFT HEART 12002 (CATHETERS) ×2
WATER STERILE IRR 1000ML POUR (IV SOLUTION) ×8 IMPLANT

## 2011-11-26 NOTE — Anesthesia Preprocedure Evaluation (Addendum)
Anesthesia Evaluation  Patient identified by MRN, date of birth, ID band Patient awake    Reviewed: Allergy & Precautions, H&P , NPO status , Patient's Chart, lab work & pertinent test results, reviewed documented beta blocker date and time   Airway Mallampati: II TM Distance: >3 FB Neck ROM: Full    Dental  (+) Teeth Intact and Dental Advisory Given   Pulmonary sleep apnea and Continuous Positive Airway Pressure Ventilation , COPD COPD inhaler,  breath sounds clear to auscultation        Cardiovascular hypertension, Pt. on medications and Pt. on home beta blockers + CAD and +CHF + dysrhythmias Atrial Fibrillation Rhythm:Regular Rate:Normal     Neuro/Psych Anxiety    GI/Hepatic   Endo/Other  Hypothyroidism   Renal/GU      Musculoskeletal   Abdominal   Peds  Hematology   Anesthesia Other Findings   Reproductive/Obstetrics                          Anesthesia Physical Anesthesia Plan  ASA: III  Anesthesia Plan: General   Post-op Pain Management:    Induction: Intravenous  Airway Management Planned: Oral ETT  Additional Equipment:   Intra-op Plan:   Post-operative Plan: Possible Post-op intubation/ventilation  Informed Consent: I have reviewed the patients History and Physical, chart, labs and discussed the procedure including the risks, benefits and alternatives for the proposed anesthesia with the patient or authorized representative who has indicated his/her understanding and acceptance.   Dental advisory given  Plan Discussed with: CRNA, Anesthesiologist and Surgeon  Anesthesia Plan Comments: (Atrial Flutter S/P ablation times 2 CAD with RCA stenosis )       Anesthesia Quick Evaluation

## 2011-11-26 NOTE — Op Note (Signed)
CARDIOTHORACIC SURGERY OPERATIVE NOTE   Date of Procedure: 11/26/2011  Preoperative Diagnosis:   Recurrent Paroxysmal Atrial Fibrillation  Severe Single-vessel Coronary Artery Disease  Postoperative Diagnosis: Same  Procedure:   Maze Procedure   complete biatrial lesion set with clipping of left atrial appendage  Coronary Artery Bypass Grafting x 1   Right Internal Mammary Artery to Distal Right Coronary Artery    Surgeon: [name]. Cornelius Moras, MD  Assistant: Doree Fudge, PA-C  Anesthesia: Kipp Brood, MD  Operative Findings:  Normal left ventricular systolic function  Good quality RIMA conduit  Good quality target vessel     BRIEF CLINICAL NOTE AND INDICATIONS FOR SURGERY  Patient is a 56 year old single white male from [place] who works as a Conservation officer, nature at [employer].  He has a long history of recurrent paroxysmal atrial fibrillation for which she has been followed by Dr. Johney Frame. He first developed atrial flutter for which he underwent ablation by Dr. Ladona Ridgel in 2005.  Shortly after that he developed atrial fibrillation and he has been treated with amiodarone and Coumadin ever since. He underwent atrial fib ablation by Dr. Johney Frame in 2009 and again in 2011.  He states that he had some improvement in the symptoms of atrial fibrillation after his first ablation in 2009, but several months later his symptoms began to recur. He reports that he did not have much of any change in symptoms after his most recent ablation performed in 2011.  He was seen in followup recently by Dr. Johney Frame and noted to have symptoms suggestive of increasing frequency of paroxysmal atrial fibrillation with worsening exercise tolerance, increasing palpitations and fatigue, and progressive exertional shortness of breath.  The patient has now been referred to consider possible surgical maze procedure.  The patient complains of gradual decline in his exercise tolerance is overall  decreased energy. He states that he occasionally has stabbing episodes of chest pain that seems to be worse following physical exertion. The patient has slow progression of exertional shortness of breath over the last several years. He has frequent palpitations and clearly knows when he has gone into atrial fibrillation. He has also been having some dizzy spells and he notes that his resting pulse and blood pressure seems to run low when he is maintaining sinus rhythm. He denies resting chest pain and resting shortness of breath. He has not had syncope. He has not had any lower extremity edema.  Cardiac catheterization was performed demonstrating high grade proximal right coronary artery stenosis with normal LV systolic function.  Options for treatment were discussed in detail.  The patient has been counseled at length regarding the indications, risks and potential benefits of surgery.  All questions have been answered, and the patient provides full informed consent for the operation as described.     DETAILS OF THE OPERATIVE PROCEDURE  The patient is brought to the operating room on the above mentioned date and central monitoring was established by the anesthesia team including placement of Swan-Ganz catheter and radial arterial line. The patient is placed in the supine position on the operating table.  Intravenous antibiotics are administered. General endotracheal anesthesia is induced uneventfully. A Foley catheter is placed.  Baseline transesophageal echocardiogram was performed.  Findings were notable for normal LV size and systolic function.  The patient's chest, abdomen, both groins, and both lower extremities are prepared and draped in a sterile manner. A time out procedure is performed.  A median sternotomy incision was performed and the right internal mammary  artery is dissected from the chest wall and prepared for bypass grafting. The right internal mammary artery is notably good quality  conduit.  Following systemic heparinization, the right internal mammary artery was transected distally noted to have excellent flow.  The pericardium is opened. The ascending aorta is normal in appearance. The right common femoral vein is cannulated using the Seldinger technique and a guidewire advanced into the right atrium using TEE guidance.  The patient is heparinized systemically and the femoral vein cannulated using a 22 Fr long femoral venous cannula.  The ascending aorta is cannulated for cardiopulmonary bypass.  Adequate heparinization is verified.      The entire pre-bypass portion of the operation was notable for stable hemodynamics.  Cardiopulmonary bypass was begun and the surface of the heart is inspected.  A second venous cannula is placed directly into the superior vena cava.   A cardioplegia cannula is placed in the ascending aorta.  A temperature probe was placed in the interventricular septum.  The patient is cooled to 32C systemic temperature.  The aortic cross clamp is applied and cold blood cardioplegia is delivered initially in an antegrade fashion through the aortic root.  Iced saline slush is applied for topical hypothermia.  The initial cardioplegic arrest is rapid with early diastolic arrest.  Repeat doses of cardioplegia are administered intermittently throughout the entire cross clamp portion of the operation through the aortic root in order to maintain completely flat electrocardiogram and septal myocardial temperature below 15C.  Myocardial protection was felt to be excellent.  The Atricure bipolar radiofrequency ablation clamp is used for all radiofrequency ablation lesions for the maze procedure.  The Atricure cryothermy system is utilized for all cryothermy ablation lesions for the maze procedure.  The heart is retracted towards the surgeon's side and the left sided pulmonary veins exposed.  An elliptical ablation lesion is created around the base of the left sided  pulmonary veins.  A similar elliptical lesion was created around the base of the left atrial appendage.  The left atrial appendage was obliterated by deploying a 45 mm Atricure left atrial appendage clip across the appendage.  The heart was replaced into the pericardial sac.  A left atriotomy incision was performed through the interatrial groove and extended partially across the back wall of the left atrium after opening the oblique sinus inferiorly.  The floor of the left atrium and the mitral valve were exposed using a self-retaining retractor.   An ablation lesion was placed around the right sided pulmonary veins using the bipolar clamp with one limb of the clamp along the endocardial surface and one along the epicardial surface posteriorly.  A bipolar ablation lesion was placed across the dome of the left atrium from the cephalad apex of the atriotomy incision to reach the cephalad apex of the elliptical lesion around the left sided pulmonary veins.  A similar bipolar lesion was placed across the back wall of the left atrium from the caudad apex of the atriotomy incision to reach the caudad apex of the elliptical lesion around the left sided pulmonary veins, thereby completing a box.  Finally another bipolar lesion was placed across the back wall of the left atrium from the caudad apex of the atriotomy incision towards the posterior mitral valve annulus.    This lesion was completed along the endocardial surface onto the posterior mitral annulus with a 3 minute duration cryothermy lesion, followed by a second cryothermy lesion along the posterior epicardial surface of the left atrium  to the coronary sinus.  This completes the entire left side lesion set of the Cox maze procedure.  The left atriotomy was closed using a 2-layer closure of running 3-0 Prolene suture after placing a sump drain across the mitral valve to serve as a left ventricular vent.    Umbilical tapes were snared around the superior  and inferior vena cavae after pulling the femoral venous catheter back until the tip was just below the junction between right atrium and inferior vena cava.  An oblique right atriotomy incision was performed.  The right atrial lateral wall lesions are performed using the bipolar radiofrequency clamp including a longitudinal line along the lateral wall from the superior vena cava to the inferior vena cava, including the posterior apex of the right atriotomy incision.  A bipolar lesion was placed from the anterior apex of the incision to the acute margin.  This lesion was completed to the tricuspid valve annulus at the 2 o'clock position using cryothermy.  The tip of the right atrial appendage was amputated.  A bipolar lesion was placed from this incision towards the AV groove and then completed to the tricuspid annulus at the 10 o'clock position using cryothermy.  This completes the right atrial lesion set.   The distal right coronary artery was grafted with the right internal mammary artery in an end-to-side fashion.  At the site of distal anastomosis the target vessel was good quality and measured approximately 2.2 mm in diameter.  The aortic cross clamp was removed after a total cross clamp time of 90 minutes.  The right atriotomy incisions were closed using 2 layers of running 4-0 Prolene suture.  Epicardial pacing wires are fixed to the right ventricular outflow tract and to the right atrial appendage. The patient is rewarmed to 37C temperature. The aortic and left ventricular vents are removed.  The superior vena cava cannula was removed.  The patient is weaned and disconnected from cardiopulmonary bypass.  The patient's rhythm at separation from bypass was AV paced.  The patient was weaned from cardioplegic bypass  without any inotropic support. Total cardiopulmonary bypass time for the operation was 121 minutes.  Followup transesophageal echocardiogram performed after separation from bypass was  unchanged from preoperatively.  The aortic cannula was removed uneventfully. Protamine was administered to reverse the anticoagulation. The femoral venous cannula was removed and manual pressure held on the groin for 30 minutes.  The mediastinum and pleural space were inspected for hemostasis and irrigated with saline solution. The mediastinum and the right pleural space were drained using 3 chest tubes placed through separate stab incisions inferiorly.  The soft tissues anterior to the aorta were reapproximated loosely. The sternum is closed with double strength sternal wire. The soft tissues anterior to the sternum were closed in multiple layers and the skin is closed with a running subcuticular skin closure.  The patient tolerated the procedure well and is transported to the surgical intensive care in stable condition. There are no intraoperative complications. All sponge instrument and needle counts are verified correct at completion of the operation.   The post-bypass portion of the operation was notable for stable rhythm and hemodynamics.   No blood products were administered during the operation.   [name]. Cornelius Moras MD

## 2011-11-26 NOTE — Progress Notes (Signed)
Patient ID: Scott [name], male   DOB: 1956-02-14, 56 y.o.   MRN: [id number]  SICU Evening Rounds:  Hemodynamically stable  CI = 2.0  Weaning on vent  Urine output good  CT output low. CBC    Component Value Date/Time   WBC 11.2* 11/26/2011 1440   RBC 3.34* 11/26/2011 1440   HGB 10.9* 11/26/2011 1454   HCT 32.0* 11/26/2011 1454   PLT 115* 11/26/2011 1440   MCV 91.6 11/26/2011 1440   MCH 31.7 11/26/2011 1440   MCHC 34.6 11/26/2011 1440   RDW 13.0 11/26/2011 1440   LYMPHSABS 1.0 11/25/2011 1635   MONOABS 0.7 11/25/2011 1635   EOSABS 0.2 11/25/2011 1635   BASOSABS 0.0 11/25/2011 1635    BMET    Component Value Date/Time   NA 140 11/26/2011 1454   K 4.0 11/26/2011 1454   CL 104 11/26/2011 0525   CO2 29 11/26/2011 0525   GLUCOSE 110* 11/26/2011 1454   BUN 20 11/26/2011 0525   CREATININE 1.42* 11/26/2011 0525   CALCIUM 9.6 11/26/2011 0525   GFRNONAA 54* 11/26/2011 0525   GFRAA 62* 11/26/2011 0525    A/P:  Stable postop course.

## 2011-11-26 NOTE — Preoperative (Signed)
Beta Blockers   Reason not to administer Beta Blockers:Not Applicable 

## 2011-11-26 NOTE — Progress Notes (Signed)
*  PRELIMINARY RESULTS* Echocardiogram Echocardiogram Transesophageal has been performed.  Scott [name] 11/26/2011, 9:47 AM

## 2011-11-26 NOTE — Transfer of Care (Signed)
Immediate Anesthesia Transfer of Care Note  Patient: Scott [name]  Procedure(s) Performed: Procedure(s) (LRB) with comments: MAZE (N/A) - Cryomaze  CORONARY ARTERY BYPASS GRAFTING (CABG) (N/A) - coronary artery bypass on pump times one utilizing the right internal mammary artery, transesophageal echocardiogram   Patient Location: SICU  Anesthesia Type: General  Level of Consciousness: sedated  Airway & Oxygen Therapy: Patient remains intubated per anesthesia plan and Patient placed on Ventilator (see vital sign flow sheet for setting)  Post-op Assessment: Report given to PACU RN and Post -op Vital signs reviewed and stable  Post vital signs: Reviewed and stable  Complications: No apparent anesthesia complications

## 2011-11-26 NOTE — Brief Op Note (Signed)
11/24/2011 - 11/26/2011  2:05 PM  PATIENT:  Scott [name]  56 y.o. male  PRE-OPERATIVE DIAGNOSIS:  ATRIAL FIB, CAD  POST-OPERATIVE DIAGNOSIS:  ATRIAL FIB, CAD  PROCEDURE:    Maze Procedure  Coronary Artery Bypass Grafting x1  SURGEON:    Purcell Nails, MD  ASSISTANTS:  Doree Fudge, PA-C  ANESTHESIA:   Kipp Brood, MD  CROSSCLAMP TIME:   90'  CARDIOPULMONARY BYPASS TIME: 121'  FINDINGS:  Normal LV systolic function  Good quality RIMA conduit  Good quality target vessel  COMPLICATIONS: none  PATIENT DISPOSITION:   TO SICU IN STABLE CONDITION  Reannon Candella H 11/26/2011 2:06 PM

## 2011-11-26 NOTE — Procedures (Signed)
Extubation Procedure Note  Patient Details:   Name: Scott [name] DOB: 07/03/1955 MRN: [id number]   Airway Documentation:  Airway 8 mm (Active)  Secured at (cm) 22 cm 11/26/2011  2:45 PM  Measured From Teeth 11/26/2011  2:45 PM  Secured Location Right 11/26/2011  2:45 PM  Secured By [employer] Tape 11/26/2011  2:45 PM    Evaluation  O2 sats: stable throughout Complications: No apparent complications Patient did tolerate procedure well. Bilateral Breath Sounds: Clear NIF -40 VC 2liters 02 sat 97 on 4liter [place] RR 21 HR 91 BP 100/61  Yes  Ave Filter 11/26/2011, 6:39 PM

## 2011-11-27 ENCOUNTER — Inpatient Hospital Stay (HOSPITAL_COMMUNITY): Payer: Medicaid Other

## 2011-11-27 LAB — CBC
MCH: 31.7 pg (ref 26.0–34.0)
MCV: 91.8 fL (ref 78.0–100.0)
MCV: 92.9 fL (ref 78.0–100.0)
Platelets: 121 10*3/uL — ABNORMAL LOW (ref 150–400)
Platelets: 124 10*3/uL — ABNORMAL LOW (ref 150–400)
RBC: 2.66 MIL/uL — ABNORMAL LOW (ref 4.22–5.81)
RDW: 13.3 % (ref 11.5–15.5)
RDW: 13.6 % (ref 11.5–15.5)
WBC: 10.8 10*3/uL — ABNORMAL HIGH (ref 4.0–10.5)
WBC: 12.1 10*3/uL — ABNORMAL HIGH (ref 4.0–10.5)

## 2011-11-27 LAB — POCT I-STAT, CHEM 8
BUN: 20 mg/dL (ref 6–23)
Chloride: 103 mEq/L (ref 96–112)
Creatinine, Ser: 1.5 mg/dL — ABNORMAL HIGH (ref 0.50–1.35)
Glucose, Bld: 131 mg/dL — ABNORMAL HIGH (ref 70–99)
Hemoglobin: 8.2 g/dL — ABNORMAL LOW (ref 13.0–17.0)
Potassium: 3.9 mEq/L (ref 3.5–5.1)
Sodium: 139 mEq/L (ref 135–145)

## 2011-11-27 LAB — MAGNESIUM
Magnesium: 2.6 mg/dL — ABNORMAL HIGH (ref 1.5–2.5)
Magnesium: 2.3 mg/dL (ref 1.5–2.5)

## 2011-11-27 LAB — CREATININE, SERUM
Creatinine, Ser: 1.35 mg/dL (ref 0.50–1.35)
GFR calc Af Amer: 66 mL/min — ABNORMAL LOW (ref >90)

## 2011-11-27 LAB — GLUCOSE, CAPILLARY
Glucose-Capillary: 126 mg/dL — ABNORMAL HIGH (ref 70–99)
Glucose-Capillary: 94 mg/dL (ref 70–99)

## 2011-11-27 LAB — BASIC METABOLIC PANEL
Calcium: 7.3 mg/dL — ABNORMAL LOW (ref 8.4–10.5)
Chloride: 109 mEq/L (ref 96–112)
Creatinine, Ser: 1.12 mg/dL (ref 0.50–1.35)
GFR calc Af Amer: 83 mL/min — ABNORMAL LOW (ref >90)
Sodium: 138 mEq/L (ref 135–145)

## 2011-11-27 MED ORDER — ALPRAZOLAM 0.5 MG PO TABS
0.5000 mg | ORAL_TABLET | Freq: Two times a day (BID) | ORAL | Status: DC | PRN
  Administered 2011-11-28 – 2011-12-02 (×6): 0.5 mg via ORAL
  Filled 2011-11-27 (×6): qty 1

## 2011-11-27 MED ORDER — POTASSIUM CHLORIDE 10 MEQ/50ML IV SOLN: 10.0000 meq | INTRAVENOUS | Status: DC

## 2011-11-27 MED ORDER — MORPHINE SULFATE 2 MG/ML IJ SOLN
2.0000 mg | INTRAMUSCULAR | Status: DC | PRN
  Administered 2011-11-27 – 2011-12-01 (×3): 2 mg via INTRAVENOUS
  Filled 2011-11-27: qty 1
  Filled 2011-11-27: qty 2

## 2011-11-27 MED ORDER — DOPAMINE-DEXTROSE 3.2-5 MG/ML-% IV SOLN
3.0000 ug/kg/min | INTRAVENOUS | Status: DC
  Administered 2011-11-27: 3 ug/kg/min via INTRAVENOUS

## 2011-11-27 MED ORDER — SIMVASTATIN 40 MG PO TABS
40.0000 mg | ORAL_TABLET | Freq: Every day | ORAL | Status: DC
  Administered 2011-11-27 – 2011-12-02 (×6): 40 mg via ORAL
  Filled 2011-11-27 (×7): qty 1

## 2011-11-27 MED ORDER — INSULIN ASPART 100 UNIT/ML ~~LOC~~ SOLN
0.0000 [IU] | SUBCUTANEOUS | Status: AC
  Administered 2011-11-27 (×2): 2 [IU] via SUBCUTANEOUS

## 2011-11-27 MED ORDER — ALBUMIN HUMAN 5 % IV SOLN
12.5000 g | Freq: Once | INTRAVENOUS | Status: AC
  Administered 2011-11-27: 12.5 g via INTRAVENOUS

## 2011-11-27 MED ORDER — FUROSEMIDE 10 MG/ML IJ SOLN
20.0000 mg | Freq: Four times a day (QID) | INTRAMUSCULAR | Status: DC
  Administered 2011-11-27: 20 mg via INTRAVENOUS

## 2011-11-27 MED ORDER — ALBUMIN HUMAN 5 % IV SOLN
INTRAVENOUS | Status: AC
  Administered 2011-11-27: 12.5 g via INTRAVENOUS
  Filled 2011-11-27: qty 250

## 2011-11-27 MED ORDER — CITALOPRAM HYDROBROMIDE 20 MG PO TABS
20.0000 mg | ORAL_TABLET | Freq: Every day | ORAL | Status: DC
Start: 2011-11-27 — End: 2011-12-03
  Administered 2011-11-27 – 2011-12-03 (×7): 20 mg via ORAL
  Filled 2011-11-27 (×7): qty 1

## 2011-11-27 MED ORDER — WARFARIN - PHYSICIAN DOSING INPATIENT
Freq: Every day | Status: DC
  Administered 2011-11-27: 18:00:00

## 2011-11-27 MED ORDER — WARFARIN 1.25 MG HALF TABLET
3.7500 mg | ORAL_TABLET | ORAL | Status: DC
  Administered 2011-11-30: 3.75 mg via ORAL
  Filled 2011-11-27: qty 1

## 2011-11-27 MED ORDER — DOPAMINE-DEXTROSE 3.2-5 MG/ML-% IV SOLN
INTRAVENOUS | Status: AC
  Filled 2011-11-27: qty 250

## 2011-11-27 MED ORDER — WARFARIN SODIUM 2.5 MG PO TABS
2.5000 mg | ORAL_TABLET | ORAL | Status: DC
  Administered 2011-11-27 – 2011-12-01 (×4): 2.5 mg via ORAL
  Filled 2011-11-27 (×5): qty 1

## 2011-11-27 MED ORDER — WARFARIN SODIUM 2.5 MG PO TABS: 2.5000 mg | ORAL_TABLET | Freq: Every day | ORAL | Status: DC

## 2011-11-27 MED ORDER — KETOROLAC TROMETHAMINE 15 MG/ML IJ SOLN
15.0000 mg | Freq: Four times a day (QID) | INTRAMUSCULAR | Status: DC
  Administered 2011-11-27: 15 mg via INTRAVENOUS
  Filled 2011-11-27: qty 1

## 2011-11-27 MED ORDER — INSULIN ASPART 100 UNIT/ML ~~LOC~~ SOLN: 0.0000 [IU] | SUBCUTANEOUS | Status: DC

## 2011-11-27 MED ORDER — INSULIN ASPART 100 UNIT/ML ~~LOC~~ SOLN
0.0000 [IU] | SUBCUTANEOUS | Status: DC
  Administered 2011-11-27 – 2011-11-28 (×3): 2 [IU] via SUBCUTANEOUS

## 2011-11-27 MED ORDER — BUDESONIDE-FORMOTEROL FUMARATE 160-4.5 MCG/ACT IN AERO
2.0000 | INHALATION_SPRAY | Freq: Two times a day (BID) | RESPIRATORY_TRACT | Status: DC
  Administered 2011-11-27 – 2011-12-02 (×11): 2 via RESPIRATORY_TRACT
  Filled 2011-11-27 (×2): qty 6

## 2011-11-27 MED FILL — Dexmedetomidine HCl IV Soln 200 MCG/2ML: INTRAVENOUS | Qty: 2 | Status: AC

## 2011-11-27 MED FILL — Potassium Chloride Inj 2 mEq/ML: INTRAVENOUS | Qty: 40 | Status: AC

## 2011-11-27 MED FILL — Magnesium Sulfate Inj 50%: INTRAMUSCULAR | Qty: 10 | Status: AC

## 2011-11-27 NOTE — Plan of Care (Signed)
Problem: Phase II Progression Outcomes Goal: Patient extubated within - Outcome: Completed/Met Date Met:  11/27/11 Extubated within 6 hours    Goal: Emotional support given Outcome: Completed/Met Date Met:  11/27/11 As pt woke up more after surgery, reminded patient of what to expect and how his course was progressing normally.

## 2011-11-27 NOTE — Progress Notes (Addendum)
[address]            [place] [id number]          [phone]     T[facility] DAILY PROGRESS NOTE                   [address]            [place] [id number]          [phone]      1 Day Post-Op Procedure(s) (LRB): MAZE (N/A) CORONARY ARTERY BYPASS GRAFTING (CABG) (N/A)  Total Length of Stay:  LOS: 3 days   Subjective: Feels well, mild soreness  Objective: Vital signs in last 24 hours: Temp:  [97.3 F (36.3 C)-99.3 F (37.4 C)] 98.2 F (36.8 C) (10/03 0720) Pulse Rate:  [80-90] 90  (10/03 0700) Cardiac Rhythm:  [-] A-V Sequential paced (10/03 0000) Resp:  [12-22] 18  (10/03 0700) BP: (80-121)/(52-77) 85/54 mmHg (10/03 0500) SpO2:  [92 %-100 %] 96 % (10/03 0700) Arterial Line BP: (79-119)/(51-73) 112/52 mmHg (10/03 0700) FiO2 (%):  [40 %-50 %] 40 % (10/02 1800) Weight:  [213 lb 6.5 oz (96.8 kg)] 213 lb 6.5 oz (96.8 kg) (10/03 0500)  Filed Weights   11/23/11 1500 11/24/11 0552 11/27/11 0500  Weight: 195 lb 1.7 oz (88.5 kg) 195 lb (88.451 kg) 213 lb 6.5 oz (96.8 kg)    Weight change:    Hemodynamic parameters for last 24 hours: PAP: (23-42)/(11-30) 34/17 mmHg CO:  [3.5 L/min-5.5 L/min] 4.9 L/min CI:  [1.8 L/min/m2-2.8 L/min/m2] 2.5 L/min/m2  Intake/Output from previous day: 10/02 0701 - 10/03 0700 In: 7506 [P.O.:440; I.V.:4776; Blood:558; NG/GT:30; IV Piggyback:1702] Out: 3295 [Urine:1445; Blood:750; Chest Tube:1100]  Intake/Output this shift:    Current Meds: Scheduled Meds:   . acetaminophen (TYLENOL) oral liquid 160 mg/5 mL  650 mg Per Tube NOW   Or  . acetaminophen  650 mg Rectal NOW  . acetaminophen  1,000 mg Oral Q6H   Or  . acetaminophen (TYLENOL) oral liquid 160 mg/5 mL  975 mg Per Tube Q6H  . aspirin EC  325 mg Oral Daily   Or  . aspirin  324 mg Per Tube Daily  . bisacodyl  10 mg Oral Daily   Or  . bisacodyl  10 mg Rectal Daily  . cefUROXime (ZINACEF)  IV  1.5 g Intravenous Q12H  .  docusate sodium  200 mg Oral Daily  . famotidine (PEPCID) IV  20 mg Intravenous Q12H  . insulin aspart  0-24 Units Subcutaneous Q2H   Followed by  . insulin aspart  0-24 Units Subcutaneous Q4H  . magnesium sulfate  4 g Intravenous Once  . metoprolol tartrate  12.5 mg Oral BID   Or  . metoprolol tartrate  12.5 mg Per Tube BID  . pantoprazole  40 mg Oral Q1200  . potassium chloride  10 mEq Intravenous Q1 Hr x 3  . sodium chloride  3 mL Intravenous Q12H  . vancomycin  1,500 mg Intravenous To OR  . vancomycin  1,000 mg Intravenous Once  . DISCONTD: amiodarone  100 mg Oral Daily  . DISCONTD: amLODipine  5 mg Oral Daily  . DISCONTD: aspirin EC  81 mg Oral Daily  . DISCONTD: atorvastatin  20 mg Oral q1800  . DISCONTD: budesonide-formoterol  2 puff Inhalation BID  . DISCONTD: chlorhexidine  30  mL Topical UD  . DISCONTD: citalopram  20 mg Oral Daily  . DISCONTD: enalapril  10 mg Oral Daily  . DISCONTD: heparin-papaverine-plasmalyte irrigation   Irrigation To OR  . DISCONTD: hydrochlorothiazide  25 mg Oral Daily  . DISCONTD: insulin regular  0-10 Units Intravenous TID WC  . DISCONTD: nadolol  20 mg Oral Daily  . DISCONTD: sodium chloride  3 mL Intravenous Q12H   Continuous Infusions:   . sodium chloride 20 mL/hr (11/26/11 1642)  . sodium chloride 20 mL/hr (11/26/11 1500)  . sodium chloride    . dexmedetomidine Stopped (11/27/11 0500)  . lactated ringers 20 mL/hr (11/26/11 1510)  . nitroGLYCERIN    . phenylephrine (NEO-SYNEPHRINE) Adult infusion 20 mcg/min (11/27/11 0400)  . DISCONTD: insulin (NOVOLIN-R) infusion     PRN Meds:.albumin human, calcium chloride, metoprolol, midazolam, morphine injection, morphine injection, ondansetron (ZOFRAN) IV, oxyCODONE, sodium chloride, DISCONTD: sodium chloride, DISCONTD: acetaminophen, DISCONTD: albuterol, DISCONTD: ALPRAZolam, DISCONTD: heparin-papaverine-plasmalyte irrigation, DISCONTD: HYDROcodone-acetaminophen, DISCONTD:  morphine injection,  DISCONTD: nitroGLYCERIN, DISCONTD: ondansetron (ZOFRAN) IV, DISCONTD: sodium chloride DISCONTD: Surgifoam 1 Gm with 0.9% sodium chloride (4 ml) topical solution, DISCONTD: Surgifoam 1 Gm with 0.9% sodium chloride (4 ml) topical solution, DISCONTD: temazepam  General appearance: alert, cooperative and no distress Neurologic: intact Heart: regular rate and rhythm Lungs: clear anteriorly Abdomen: soft, nontender Extremities: + edema Wound: dressings CDI  Lab Results: CBC: Basename 11/27/11 0400 11/26/11 2012  WBC 10.8* 13.1*  HGB 8.5* 9.9*9.5*  HCT 24.6* 28.3*28.0*  PLT 121* 127*   BMET:  Basename 11/27/11 0400 11/26/11 2012 11/26/11 0525  NA 138 141 --  K 4.1 4.3 --  CL 109 110 --  CO2 23 -- 29  GLUCOSE 113* 99 --  BUN 16 16 --  CREATININE 1.12 1.071.20 --  CALCIUM 7.3* -- 9.6    PT/INR:  Basename 11/26/11 1440  LABPROT 19.3*  INR 1.69*   Radiology: Dg Chest Portable 1 View  11/26/2011  *RADIOLOGY REPORT*  Clinical Data: Post CABG and Maze procedure, follow-up  PORTABLE CHEST - 1 VIEW  Comparison: Chest x-ray of 11/25/2011  Findings: The carina is difficult to visualize but the tip of endotracheal tube appears to be a loop of proximally 3.0 cm above the carina.  Swan-Ganz catheter is present with the tip in the main pulmonary artery.  Her right chest tube is noted.  A small amount of subcutaneous air is present over the lung apices and a tiny right apical pneumothorax cannot be excluded.  The heart is stable and an atrial appendage exclusion device is present. There is mild atelectasis at the right lung base and a small effusion cannot be excluded.  IMPRESSION:  1.  Tip of endotracheal tube approximately 3.0 cm above the carina. 2.  Right chest tube present.  Cannot exclude tiny right pneumothorax with probable small right pleural effusion and basilar volume loss. 3.  Small amount of subcutaneous air in the soft tissues of the neck right greater than left.   Original Report  Authenticated By: Juline Patch, M.D.      Assessment/Plan: S/P Procedure(s) (LRB): MAZE (N/A) CORONARY ARTERY BYPASS GRAFTING (CABG) (N/A)  1.Doing well, hemodynamically stable, d/c sganz, aline, when totally off neo 2 expected ABL anemia, monitor 3 chest tubes with 750 out last 12 hours, poss d/c soon 4 pacer dependent- observe, no lopressor for now  @ME1 @ 11/27/2011 8:26 AM   I have seen and examined the patient and agree with the assessment and plan as outlined.  Slow  junctional rhythm under pacer.  Currently AAI pacing.  Will hold beta blocker and amiodarone.  Mobilize. Diuresis.  Leave chest tubes in place and mobilize due to right effusion on this morning's CXR.  Keep in SICU today due to low dose neo drip and pacer-dependence.  Restart coumadin.  Modesta Sammons H 11/27/2011 9:26 AM

## 2011-11-27 NOTE — Addendum Note (Signed)
Addendum  created 11/27/11 0931 by Kipp Brood, MD   Modules edited:Notes Section

## 2011-11-27 NOTE — Progress Notes (Signed)
T[facility] BRIEF SICU PROGRESS NOTE  1 Day Post-Op  S/P Procedure(s) (LRB): MAZE (N/A) CORONARY ARTERY BYPASS GRAFTING (CABG) (N/A)   Stable day AAI paced BP 90-100 systolic UOP marginal  Plan: Will give 250 mL albumin x1 and hold toradol  Alan Drummer H 11/27/2011 5:40 PM

## 2011-11-27 NOTE — Progress Notes (Signed)
Anesthesiology Follow-up:  Awake and alert, neurologically intact. AV pacer dependent, but otherwise hemodynamically stable.  VS: T- 37.4 BP 108/56 HR-90 (AV paced) RR 16 O2 sat 98% on 2L PA 36/24 CO/CI 6/3  H/H: 8.5/24.6 Plts; 124,000 BUN/Cr 16/1.12 K-4.1  Extubated 4 hours post-op.   56 y/o male with h/o Afib S/P open Maze and CABG times 1 Still requiring AV pacing, otherwise doing very well.  Kipp Brood, MD

## 2011-11-27 NOTE — Anesthesia Postprocedure Evaluation (Signed)
  Anesthesia Post-op Note  Patient: Scott [name]  Procedure(s) Performed: Procedure(s) (LRB) with comments: MAZE (N/A) - Cryomaze  CORONARY ARTERY BYPASS GRAFTING (CABG) (N/A) - coronary artery bypass on pump times one utilizing the right internal mammary artery, transesophageal echocardiogram   Patient Location: SICU  Anesthesia Type: General  Level of Consciousness: sedated and unresponsive  Airway and Oxygen Therapy: Patient remains intubated per anesthesia plan and Patient placed on Ventilator (see vital sign flow sheet for setting)  Post-op Pain: none  Post-op Assessment: Post-op Vital signs reviewed and Patient's Cardiovascular Status Stable  Post-op Vital Signs: stable  Complications: No apparent anesthesia complications

## 2011-11-27 NOTE — CV Procedure (Signed)
Intraoperative transesophageal echocardiography report:  Scott [name] is a 56 year old male with paroxysmal atrial fibrillation and single vessel coronary artery disease of the RCA who is scheduled to undergo open  Maze procedure and coronary artery bypass grafting x1. Echocardiography was requested to evaluate the right and left ventricular function and to assess for any valvular pathology.  The patient was brought to the operating room at [facility] and general anesthesia was induced without difficulty. Following endotracheal intubation and orogastric suctioning, the transesophageal echocardiography probe was inserted into the esophagus without difficulty.  Pre-bypass Findings:  1. Aortic valve: The aortic valve was trileaflet. There was no thickening or calcification of the leaflets noted. The leaflets opened normally and there was no aortic insufficiency.  2. Mitral valve: Mitral leaflets were thin and pliable and coapted well without flail or prolapsing segments. There was trace mitral insufficiency.  3. Left ventricle: There was normal appearing left ventricular systolic function. There was normal contractility in all segments interrogated and the ejection fraction was estimated 60-65%. Left ventricular end-diastolic diameter measured 46 mm. Left ventricular wall thickness measured 1.0-1.05 at end diastole at the mid-papillary level of the anterior wall.  4.  Right ventricle: The right ventricular cavity was of normal size and there was normal contractility the right ventricular free wall.  5. Pulmonic valve: The pulmonic leaflets appeared to open normally and there was trace pulmonic insufficiency.  6. Tricuspid valve: The tricuspid leaflets appeared structurally normal and there was trace tricuspid insufficiency.  7. Intra-atrial septum: The interatrial septum was intact without evidence of patent foramen ovale or atrial septal defect by color Doppler and bubble study.  8.  Left atrium: The left atrial cavity did not at enlarged there was no thrombus noted in the left atrial cavity but in the left atrial appendage, a small thrombus could not be definitively excluded.  9. Ascending Aorta: The ascending aorta showed a well-defined aortic root and sinotubular ridge there was mild calcification in the area of the right sinus of Valsalva. There is no effacement of the sinuses of Valsalva  Bypass findings: 1. Aortic valve: The aortic valve appeared normal and was unchanged from the pre-bypass study.  2. Mitral vale: The  mitral valve appeared normal and were unchanged from the pre-bypass study. There was trace mitral insufficiency.  3. Left ventricle: There was some dyssynchrony of ventricular contraction due to  ventricular pacing. However  all segments appear to contract adequately and the ejection fraction was estimated at 60%.  4.  Right ventricle: There was normal appearing right ventricular function.   5. Left atrial appendage: The left atrial appendage could not be visualized in the post-bypass period  Kipp Brood, MD      . Descending aorta: The descending aorta measured 2.2 cm in diameter and there was mild atheromatous disease noted.

## 2011-11-27 NOTE — Progress Notes (Signed)
Pt is recovering s/p MAZE/ CABG. Rhythm is AV paced and dependant.  I will follow along.  Scott Fearing Sharnese Heath,MD

## 2011-11-28 ENCOUNTER — Encounter (HOSPITAL_COMMUNITY): Payer: Self-pay | Admitting: Thoracic Surgery (Cardiothoracic Vascular Surgery)

## 2011-11-28 ENCOUNTER — Inpatient Hospital Stay (HOSPITAL_COMMUNITY): Payer: Medicaid Other

## 2011-11-28 LAB — CBC
HCT: 22.6 % — ABNORMAL LOW (ref 39.0–52.0)
Hemoglobin: 7.8 g/dL — ABNORMAL LOW (ref 13.0–17.0)
MCH: 31.8 pg (ref 26.0–34.0)
MCV: 92.2 fL (ref 78.0–100.0)
Platelets: 108 10*3/uL — ABNORMAL LOW (ref 150–400)
RBC: 2.45 MIL/uL — ABNORMAL LOW (ref 4.22–5.81)
WBC: 9.7 10*3/uL (ref 4.0–10.5)

## 2011-11-28 LAB — GLUCOSE, CAPILLARY
Glucose-Capillary: 116 mg/dL — ABNORMAL HIGH (ref 70–99)
Glucose-Capillary: 114 mg/dL — ABNORMAL HIGH (ref 70–99)
Glucose-Capillary: 125 mg/dL — ABNORMAL HIGH (ref 70–99)
Glucose-Capillary: 119 mg/dL — ABNORMAL HIGH (ref 70–99)

## 2011-11-28 LAB — BASIC METABOLIC PANEL
CO2: 25 mEq/L (ref 19–32)
Calcium: 7.5 mg/dL — ABNORMAL LOW (ref 8.4–10.5)
Chloride: 104 mEq/L (ref 96–112)
Glucose, Bld: 107 mg/dL — ABNORMAL HIGH (ref 70–99)
Potassium: 3.8 mEq/L (ref 3.5–5.1)
Sodium: 136 mEq/L (ref 135–145)

## 2011-11-28 MED ORDER — FUROSEMIDE 10 MG/ML IJ SOLN
20.0000 mg | Freq: Four times a day (QID) | INTRAMUSCULAR | Status: AC
  Administered 2011-11-28 (×2): 20 mg via INTRAVENOUS
  Filled 2011-11-28 (×2): qty 2

## 2011-11-28 MED ORDER — FUROSEMIDE 10 MG/ML IJ SOLN
INTRAMUSCULAR | Status: AC
  Filled 2011-11-28: qty 4

## 2011-11-28 MED ORDER — FUROSEMIDE 10 MG/ML IJ SOLN
INTRAMUSCULAR | Status: AC
  Administered 2011-11-28: 10 mg
  Filled 2011-11-28: qty 4

## 2011-11-28 MED ORDER — POTASSIUM CHLORIDE 10 MEQ/50ML IV SOLN
10.0000 meq | INTRAVENOUS | Status: AC
  Administered 2011-11-28 (×4): 10 meq via INTRAVENOUS
  Filled 2011-11-28: qty 100

## 2011-11-28 MED ORDER — MOVING RIGHT ALONG BOOK
Freq: Once | Status: AC
  Administered 2011-11-28: 09:00:00
  Filled 2011-11-28: qty 1

## 2011-11-28 MED ORDER — SODIUM CHLORIDE 0.9 % IJ SOLN
3.0000 mL | Freq: Two times a day (BID) | INTRAMUSCULAR | Status: DC
  Administered 2011-11-28 – 2011-12-02 (×9): 3 mL via INTRAVENOUS

## 2011-11-28 MED ORDER — TRAMADOL HCL 50 MG PO TABS: 50.0000 mg | ORAL_TABLET | ORAL | Status: DC | PRN

## 2011-11-28 MED ORDER — POTASSIUM CHLORIDE CRYS ER 20 MEQ PO TBCR
20.0000 meq | EXTENDED_RELEASE_TABLET | Freq: Two times a day (BID) | ORAL | Status: DC
  Administered 2011-11-29 – 2011-12-03 (×9): 20 meq via ORAL
  Filled 2011-11-28 (×10): qty 1

## 2011-11-28 MED ORDER — SODIUM CHLORIDE 0.9 % IJ SOLN: 3.0000 mL | INTRAMUSCULAR | Status: DC | PRN

## 2011-11-28 MED ORDER — SODIUM CHLORIDE 0.9 % IV SOLN: 250.0000 mL | INTRAVENOUS | Status: DC | PRN

## 2011-11-28 MED ORDER — ASPIRIN EC 81 MG PO TBEC
81.0000 mg | DELAYED_RELEASE_TABLET | Freq: Every day | ORAL | Status: DC
Start: 2011-11-28 — End: 2011-12-03
  Administered 2011-11-28 – 2011-12-03 (×6): 81 mg via ORAL
  Filled 2011-11-28 (×6): qty 1

## 2011-11-28 MED ORDER — FUROSEMIDE 40 MG PO TABS
40.0000 mg | ORAL_TABLET | Freq: Two times a day (BID) | ORAL | Status: DC
  Administered 2011-11-29 – 2011-12-03 (×9): 40 mg via ORAL
  Filled 2011-11-28 (×11): qty 1

## 2011-11-28 MED FILL — Mannitol IV Soln 20%: INTRAVENOUS | Qty: 500 | Status: AC

## 2011-11-28 MED FILL — Lidocaine HCl IV Inj 20 MG/ML: INTRAVENOUS | Qty: 5 | Status: AC

## 2011-11-28 MED FILL — Heparin Sodium (Porcine) Inj 1000 Unit/ML: INTRAMUSCULAR | Qty: 30 | Status: AC

## 2011-11-28 MED FILL — Heparin Sodium (Porcine) Inj 1000 Unit/ML: INTRAMUSCULAR | Qty: 10 | Status: AC

## 2011-11-28 MED FILL — Sodium Chloride Irrigation Soln 0.9%: Qty: 3000 | Status: AC

## 2011-11-28 MED FILL — Sodium Bicarbonate IV Soln 8.4%: INTRAVENOUS | Qty: 50 | Status: AC

## 2011-11-28 MED FILL — Sodium Chloride IV Soln 0.9%: INTRAVENOUS | Qty: 1000 | Status: AC

## 2011-11-28 MED FILL — Electrolyte-R (PH 7.4) Solution: INTRAVENOUS | Qty: 4000 | Status: AC

## 2011-11-28 NOTE — Progress Notes (Addendum)
   CARDIOTHORACIC SURGERY PROGRESS NOTE   R2 Days Post-Op Procedure(s) (LRB): MAZE (N/A) CORONARY ARTERY BYPASS GRAFTING (CABG) (N/A)  Subjective: Much less pain today but feels nauseated.  No abdominal pain.  Breathing comfortably.  Objective: Vital signs: BP Readings from Last 1 Encounters:  11/28/11 97/54   Pulse Readings from Last 1 Encounters:  11/28/11 79   Resp Readings from Last 1 Encounters:  11/28/11 17   Temp Readings from Last 1 Encounters:  11/28/11 98.5 F (36.9 C) Oral    Hemodynamics: PAP: (37-38)/(21-22) 38/21 mmHg  Physical Exam:  Rhythm:   Junctional w/ HR 45-50, stable AAI pacing  Breath sounds: clear  Heart sounds:  RRR  Incisions:  Dressings dry  Abdomen:  Soft, non-distended, non-tender  Extremities:  Warm, well perfused   Intake/Output from previous day: 10/03 0701 - 10/04 0700 In: 1065.8 [I.V.:663.8; IV Piggyback:402] Out: 1875 [Urine:1225; Chest Tube:650] Intake/Output this shift:    Lab Results:  Basename 11/28/11 0438 11/27/11 1658 11/27/11 1655  WBC 9.7 -- 12.1*  HGB 7.8* 8.2* --  HCT 22.6* 24.0* --  PLT 108* -- 124*   BMET:  Basename 11/28/11 0438 11/27/11 1658 11/27/11 0400  NA 136 139 --  K 3.8 3.9 --  CL 104 103 --  CO2 25 -- 23  GLUCOSE 107* 131* --  BUN 17 20 --  CREATININE 0.86 1.50* --  CALCIUM 7.5* -- 7.3*    CBG (last 3)   Basename 11/28/11 0743 11/28/11 0438 11/27/11 2329  GLUCAP 125* 114* 116*   ABG    Component Value Date/Time   PHART 7.385 11/26/2011 2008   HCO3 22.5 11/26/2011 2008   TCO2 22 11/27/2011 1658   ACIDBASEDEF 2.0 11/26/2011 2008   O2SAT 93.0 11/26/2011 2008   CXR: Small right pleural effusion and RLL atelectasis  Assessment/Plan: S/P Procedure(s) (LRB): MAZE (N/A) CORONARY ARTERY BYPASS GRAFTING (CABG) (N/A)  Overall stable POD2 Postop sinus node dysfunction w/ improved underlying rhythm, stable AAI pacing Expected post op acute blood loss anemia, slightly worse Expected post  op volume excess, UOP improved Small postop right pleural effusion and RLL atelectasis   Mobilize  Diuresis  D/C chest tubes  Continue AAI pacing for now and hold amiodarone, beta blocker  Continue to hold ACE-I and Norvasc and restart sequentially as BP increases  Continue coumadin, ASA  Watch Hgb and consider iron supplement after nausea completely resolved  Transfer step down   Liz Claiborne 11/28/2011 8:34 AM

## 2011-11-29 ENCOUNTER — Inpatient Hospital Stay (HOSPITAL_COMMUNITY): Payer: Medicaid Other

## 2011-11-29 LAB — CBC
Platelets: 125 10*3/uL — ABNORMAL LOW (ref 150–400)
RBC: 2.6 MIL/uL — ABNORMAL LOW (ref 4.22–5.81)
WBC: 9.5 10*3/uL (ref 4.0–10.5)

## 2011-11-29 LAB — GLUCOSE, CAPILLARY: Glucose-Capillary: 126 mg/dL — ABNORMAL HIGH (ref 70–99)

## 2011-11-29 LAB — BASIC METABOLIC PANEL
CO2: 28 mEq/L (ref 19–32)
Chloride: 105 mEq/L (ref 96–112)
Sodium: 139 mEq/L (ref 135–145)

## 2011-11-29 LAB — PROTIME-INR: Prothrombin Time: 19.7 seconds — ABNORMAL HIGH (ref 11.6–15.2)

## 2011-11-29 MED ORDER — POLYSACCHARIDE IRON COMPLEX 150 MG PO CAPS
150.0000 mg | ORAL_CAPSULE | Freq: Every day | ORAL | Status: DC
  Administered 2011-11-29 – 2011-12-03 (×5): 150 mg via ORAL
  Filled 2011-11-29 (×5): qty 1

## 2011-11-29 NOTE — Progress Notes (Signed)
CARDIAC REHAB PHASE I   PRE:  Rate/Rhythm: A paced 80  BP:    Sitting: 102/60     SaO2: 96% 2L  MODE:  Ambulation: 380 ft   POST:  Rate/Rhythem: A paced 80  BP:    Sitting: 108/60    SaO2: 95% 2L Decreased to 1L  0830-0900  Patient ambulated in the hallway with rolling walker at a slow steady pace with external pacer intact. Patient's oxygen saturations remained at 95% during walk.  O2 decreased to 1L upon return to room. Patient placed in recliner with call bell within reach. Encouraged patient to use incentive spirometry.  Harlon Flor, Arta Bruce

## 2011-11-29 NOTE — Progress Notes (Signed)
Pt abulated approximately 350 feet x 1 assist and walker. On 02 pt tolerated well slightly short of breath but back in bed call bell in reach. Lavera Vandermeer, Randall An RN

## 2011-11-29 NOTE — Progress Notes (Addendum)
                    [address]            [name] [address]          [phone]     3 Days Post-Op Procedure(s) (LRB): MAZE (N/A) CORONARY ARTERY BYPASS GRAFTING (CABG) (N/A)  Subjective: Just back from Xray.  A little tired, but otherwise feels well.   Objective: Vital signs in last 24 hours: Patient Vitals for the past 24 hrs:  BP Temp Temp src Pulse Resp SpO2 Weight  11/29/11 0804 - - - - - 88 % -  11/29/11 0646 121/74 mmHg 98.1 F (36.7 C) Oral 80  18  94 % 207 lb 14.3 oz (94.3 kg)  11/28/11 2024 123/72 mmHg 98.3 F (36.8 C) Oral 79  18  93 % -  11/28/11 2022 - - - - - 93 % -  11/28/11 1445 134/86 mmHg 98.1 F (36.7 C) Oral 79  18  94 % -  11/28/11 1400 122/66 mmHg - - 85  18  93 % -  11/28/11 1300 122/57 mmHg - - 78  17  95 % -  11/28/11 1200 114/65 mmHg - - 79  16  90 % -  11/28/11 1134 - 97.4 F (36.3 C) Oral - - - -  11/28/11 1100 106/59 mmHg - - 79  16  94 % -  11/28/11 1000 117/65 mmHg - - 80  18  96 % -   Current Weight  11/29/11 207 lb 14.3 oz (94.3 kg)     Intake/Output from previous day: 10/04 0701 - 10/05 0700 In: 598.2 [P.O.:360; I.V.:36.2; IV Piggyback:202] Out: 800 [Urine:800]    PHYSICAL EXAM:  Heart: RRR, paced at 80 Lungs: decreased BS in bases bilaterally Wound: dressed and dry Extremities: mild LE edema    Lab Results: CBC: Basename 11/29/11 0500 11/28/11 0438  WBC 9.5 9.7  HGB 8.2* 7.8*  HCT 24.2* 22.6*  PLT 125* 108*   BMET:  Basename 11/29/11 0500 11/28/11 0438  NA 139 136  K 3.9 3.8  CL 105 104  CO2 28 25  GLUCOSE 115* 107*  BUN 18 17  CREATININE 1.05 0.86  CALCIUM 7.8* 7.5*    PT/INR:  Basename 11/29/11 0500  LABPROT 19.7*  INR 1.73*   CXR: small R pleural effusion/atx, stable   Assessment/Plan: S/P Procedure(s) (LRB): MAZE (N/A) CORONARY ARTERY BYPASS GRAFTING (CABG) (N/A)  CV- sinus node dysfunction after Maze.  I turned pacer down to 50 and he is still pacing.  Continue AAI at  80 for now.  BPs stable, beta blocker on hold. Continue Coumadin.  Vol overload- continue diuresis.  Expected post op blood loss anemia- stable.  Add Fe.  CRPI, pulm toilet.   LOS: 5 days    COLLINS,GINA H 11/29/2011   still needs a pacing I have seen and examined Scott [name] [name] and agree with the above assessment  and plan.  Delight Ovens MD Beeper [phone] Office [phone] 11/29/2011 11:50 AM

## 2011-11-29 NOTE — Progress Notes (Signed)
Pt only wanted to do a short walk this evening, ambulated 150 feet x1 assist  On 02 at 2L, tolerated well but stated knees were hurting this evening. Assisted back to bed call bell in reach. Abbie Berling, Randall An RN

## 2011-11-30 LAB — PROTIME-INR
INR: 1.56 — ABNORMAL HIGH (ref 0.00–1.49)
Prothrombin Time: 18.2 seconds — ABNORMAL HIGH (ref 11.6–15.2)

## 2011-11-30 LAB — GLUCOSE, CAPILLARY: Glucose-Capillary: 105 mg/dL — ABNORMAL HIGH (ref 70–99)

## 2011-11-30 NOTE — Progress Notes (Signed)
Pt ambulated in hallway x1 assist with walker. On room air sats 94%, pt back in chair call bell with in reach. Will continue to monitor patient. Janeece Blok, Randall An RN

## 2011-11-30 NOTE — Progress Notes (Addendum)
                    [address]            [place] [address]          [phone]     4 Days Post-Op Procedure(s) (LRB): MAZE (N/A) CORONARY ARTERY BYPASS GRAFTING (CABG) (N/A)  Subjective: Rested poorly due to noise in hall, but feels much better overall this am.  +BM, eating better.   Objective: Vital signs in last 24 hours: Patient Vitals for the past 24 hrs:  BP Temp Temp src Pulse Resp SpO2 Weight  11/30/11 0822 - - - - - 95 % -  11/30/11 0545 - - - - - - 205 lb 12.8 oz (93.35 kg)  11/30/11 0500 126/73 mmHg 99.1 F (37.3 C) Oral 78  20  93 % -  11/29/11 1955 - - - 79  18  92 % -  11/29/11 1937 130/79 mmHg 98.7 F (37.1 C) Oral 82  18  94 % -  11/29/11 1449 128/58 mmHg 98 F (36.7 C) - 80  18  94 % -   Current Weight  11/30/11 205 lb 12.8 oz (93.35 kg)     Intake/Output from previous day: 10/05 0701 - 10/06 0700 In: 720 [P.O.:720] Out: -     PHYSICAL EXAM:  Heart: RRR, paced at 80 Lungs: clear Wound: clean and dry Extremities: mild LE edema    Lab Results: CBC: Basename 11/29/11 0500 11/28/11 0438  WBC 9.5 9.7  HGB 8.2* 7.8*  HCT 24.2* 22.6*  PLT 125* 108*   BMET:  Basename 11/29/11 0500 11/28/11 0438  NA 139 136  K 3.9 3.8  CL 105 104  CO2 28 25  GLUCOSE 115* 107*  BUN 18 17  CREATININE 1.05 0.86  CALCIUM 7.8* 7.5*    PT/INR:  Basename 11/30/11 0540  LABPROT 18.2*  INR 1.56*      Assessment/Plan: S/P Procedure(s) (LRB): MAZE (N/A) CORONARY ARTERY BYPASS GRAFTING (CABG) (N/A) CV- sinus node dysfunction. I turned pacer down, and he is generally maintaining HRs in 60-70s, although he still has an occasional paced beat . Continue AAI at 80 until seen by MD. BPs stable, beta blocker on hold. Continue Coumadin. Vol overload- continue diuresis.  Expected post op blood loss anemia- stable. Continue Fe.  CRPI, pulm toilet.   LOS: 6 days    COLLINS,GINA H 11/30/2011  still with SA node dysfunction I have seen  and examined [name] and agree with the above assessment  and plan.  Delight Ovens MD Beeper [phone] Office [phone] 11/30/2011 10:33 AM

## 2011-11-30 NOTE — Progress Notes (Signed)
Pt ambulated in hallway, 150 feet x1 assist with walker. Tolerated well will monitor patient. Scott [name], Randall An RN

## 2011-12-01 ENCOUNTER — Inpatient Hospital Stay (HOSPITAL_COMMUNITY): Payer: Medicaid Other

## 2011-12-01 ENCOUNTER — Other Ambulatory Visit: Payer: Self-pay

## 2011-12-01 DIAGNOSIS — I4891 Unspecified atrial fibrillation: Secondary | ICD-10-CM

## 2011-12-01 DIAGNOSIS — I495 Sick sinus syndrome: Secondary | ICD-10-CM

## 2011-12-01 DIAGNOSIS — Z9889 Other specified postprocedural states: Secondary | ICD-10-CM

## 2011-12-01 LAB — PROTIME-INR
INR: 1.64 — ABNORMAL HIGH (ref 0.00–1.49)
Prothrombin Time: 18.9 s — ABNORMAL HIGH (ref 11.6–15.2)

## 2011-12-01 MED ORDER — ENALAPRIL MALEATE 10 MG PO TABS
10.0000 mg | ORAL_TABLET | Freq: Every day | ORAL | Status: DC
  Administered 2011-12-01 – 2011-12-03 (×3): 10 mg via ORAL
  Filled 2011-12-01 (×3): qty 1

## 2011-12-01 MED ORDER — AMLODIPINE BESYLATE 5 MG PO TABS
5.0000 mg | ORAL_TABLET | Freq: Every day | ORAL | Status: DC
  Administered 2011-12-01 – 2011-12-03 (×3): 5 mg via ORAL
  Filled 2011-12-01 (×3): qty 1

## 2011-12-01 NOTE — Progress Notes (Signed)
SUBJECTIVE: The patient is doing well today.  He is making slow improvements s/p MAZE.     Marland Kitchen acetaminophen  1,000 mg Oral Q6H  . aspirin EC  81 mg Oral Daily  . bisacodyl  10 mg Oral Daily   Or  . bisacodyl  10 mg Rectal Daily  . budesonide-formoterol  2 puff Inhalation BID  . citalopram  20 mg Oral Daily  . docusate sodium  200 mg Oral Daily  . furosemide  40 mg Oral BID  . iron polysaccharides  150 mg Oral Daily  . pantoprazole  40 mg Oral Q1200  . potassium chloride  20 mEq Oral BID  . simvastatin  40 mg Oral q1800  . sodium chloride  3 mL Intravenous Q12H  . warfarin  2.5 mg Oral Custom  . warfarin  3.75 mg Oral Q Sun-1800  . Warfarin - Physician Dosing Inpatient   Does not apply q1800      OBJECTIVE: Physical Exam: Filed Vitals:   11/30/11 2200 12/01/11 0500 12/01/11 0604 12/01/11 0759  BP: 136/67 140/65 142/62   Pulse: 82 79    Temp: 98.8 F (37.1 C) 98.1 F (36.7 C)    TempSrc: Oral Oral    Resp: 18 20    Height:      Weight:  206 lb (93.441 kg)    SpO2: 92% 92%  95%    Intake/Output Summary (Last 24 hours) at 12/01/11 0816 Last data filed at 12/01/11 0519  Gross per 24 hour  Intake    723 ml  Output    850 ml  Net   -127 ml    Telemetry reveals atrial pacing with intact AV conduction  GEN- The patient is well appearing, alert and oriented x 3 today.   Head- normocephalic, atraumatic Eyes-  Sclera clear, conjunctiva pink Ears- hearing intact Oropharynx- clear Neck- supple,   Lungs- Clear to ausculation bilaterally, normal work of breathing Heart- Regular rate and rhythm  GI- soft, NT, ND, + BS Extremities- no clubbing, cyanosis, or edema   LABS: Basic Metabolic Panel:  Basename 11/29/11 0500  NA 139  K 3.9  CL 105  CO2 28  GLUCOSE 115*  BUN 18  CREATININE 1.05  CALCIUM 7.8*  MG --  PHOS --   Liver Function Tests: No results found for this basename: AST:2,ALT:2,ALKPHOS:2,BILITOT:2,PROT:2,ALBUMIN:2 in the last 72 hours No results  found for this basename: LIPASE:2,AMYLASE:2 in the last 72 hours CBC:  Basename 11/29/11 0500  WBC 9.5  NEUTROABS --  HGB 8.2*  HCT 24.2*  MCV 93.1  PLT 125*   ASSESSMENT AND PLAN:  Principal Problem:  *S/P Maze operation for atrial fibrillation Active Problems:  HYPOTHYROIDISM, ACQUIRED NEC  HYPERLIPIDEMIA  OBSTRUCTIVE SLEEP APNEA  HYPERTENSION  COPD  Paroxysmal atrial fibrillation  Chronic diastolic CHF (congestive heart failure)  Coronary artery disease  CAD (coronary artery disease)  S/P CABG x 1  1. Afib- maintaining sinus rhythm s/p MAZE  2. Bradycardia- rhythm has improved to some degree, presently junctional rhythm at 60 bpm is his underlying rhythm.  AV conduction has improved.  Atrial pacing threshold is 7mA. I think that it would be reasonable to put pacemaker in a backup pacing mode (AAI 40) or event turn of pacemaker and observe his intrinsic rhythm.  There is a reasonable chance that his sinus node may regain function.  Otherwise, he may require PPM.  I will defer this decision to Dr Cornelius Moras.    [name], MD 12/01/2011  8:16 AM

## 2011-12-01 NOTE — Progress Notes (Addendum)
                   [address]            [place] [address]          [phone]      5 Days Post-Op Procedure(s) (LRB): MAZE (N/A) CORONARY ARTERY BYPASS GRAFTING (CABG) (N/A)  Subjective: Patient with some incisional pain to left of sternum.  Objective: Vital signs in last 24 hours: Patient Vitals for the past 24 hrs:  BP Temp Temp src Pulse Resp SpO2 Weight  12/01/11 0604 142/62 mmHg - - - - - -  12/01/11 0500 140/65 mmHg 98.1 F (36.7 C) Oral 79  20  92 % 206 lb (93.441 kg)  11/30/11 2200 136/67 mmHg 98.8 F (37.1 C) Oral 82  18  92 % -  11/30/11 1940 - - - 79  18  91 % -  11/30/11 1459 143/60 mmHg 98.9 F (37.2 C) - 80  18  95 % -  11/30/11 1100 - - - - - 90 % -  11/30/11 0822 - - - - - 95 % -   Pre op weight  88.5 kg Current Weight  12/01/11 206 lb (93.441 kg)      Intake/Output from previous day: 10/06 0701 - 10/07 0700 In: 723 [P.O.:720; I.V.:3] Out: 850 [Urine:850]   Physical Exam:  Cardiovascular:AAI paced Pulmonary: Slightly diminished at left base; no rales, wheezes, or rhonchi. Abdomen: Soft, non tender, bowel sounds present. Extremities: Mild bilateral lower extremity edema. Wound: Clean and dry.  No erythema or signs of infection.  Lab Results: CBC: Basename 11/29/11 0500  WBC 9.5  HGB 8.2*  HCT 24.2*  PLT 125*   BMET:  Basename 11/29/11 0500  NA 139  K 3.9  CL 105  CO2 28  GLUCOSE 115*  BUN 18  CREATININE 1.05  CALCIUM 7.8*    PT/INR:  Lab Results  Component Value Date   INR 1.64* 12/01/2011   INR 1.56* 11/30/2011   INR 1.73* 11/29/2011   ABG:  INR: Will add last result for INR, ABG once components are confirmed Will add last 4 CBG results once components are confirmed  Assessment/Plan:  1. CV - Sinus node dysfunction.A paced at 80.On Coumadin.Consider decreasing external pacer to 50 (as discussed with Dr. Johney Frame) 2.  Pulmonary - Encourage incentive spirometer.Wean off oxygen as tolerates.CXR tis am is  stable (mild cardiomegaly, no ptx, small bilateral pleural effusions, and atelectasis on left). 3. Volume Overload - Continue diuresis. 4.  Acute blood loss anemia - Last H and H 8.2 and 24.2.Continue Nu Iron    ZIMMERMAN,DONIELLE MPA-C 12/01/2011,7:51 AM     I have seen and examined the patient and agree with the assessment and plan as outlined.  Maintaining junctional rhythm w/ HR 60-65.  I very much doubt that permanent pacer will be needed as sinus node function usually recovers w/in 1-2 weeks following maze procedure.  Will leave pacer off and possibly d/c home 1-2 days if HR stable.  Restart Vasotec and Norvasc for HTN.  May restart hydrodiuril and d/c lasix at the time of d/c but weight is still well above baseline.  Continue diuresis for now.    OWEN,CLARENCE H 12/01/2011 8:56 AM

## 2011-12-01 NOTE — Progress Notes (Signed)
CARDIAC REHAB PHASE I   PRE:  Rate/Rhythm: 60 JR  BP:  Supine:   Sitting: 126/61  Standing:    SaO2: 96 2L 93 RA  MODE:  Ambulation: 550 ft   POST:  Rate/Rhythem: 80 JR  BP:  Supine:   Sitting: 142/63  Standing:    SaO2: 96 RA [id number] Assisted X 1 and used walker to ambulate. Gait steady with walker S stable Pt able to walk 550 .feet tired by end of walk. Pt to bed at his request when back from wail. RA sat after 96% O2 left off. Call light in reach and IS.  Beatrix Fetters

## 2011-12-01 NOTE — Progress Notes (Signed)
Pt ambulated 550 ft with standby assist. Tolerated ambulation well. Dion Saucier

## 2011-12-02 ENCOUNTER — Inpatient Hospital Stay (HOSPITAL_COMMUNITY): Payer: Medicaid Other

## 2011-12-02 DIAGNOSIS — J90 Pleural effusion, not elsewhere classified: Secondary | ICD-10-CM

## 2011-12-02 LAB — BODY FLUID CELL COUNT WITH DIFFERENTIAL
Eos, Fluid: 0 %
Lymphs, Fluid: 23 %
Monocyte-Macrophage-Serous Fluid: 17 % — ABNORMAL LOW (ref 50–90)
Neutrophil Count, Fluid: 60 % — ABNORMAL HIGH (ref 0–25)

## 2011-12-02 LAB — HEMATOCRIT, BODY FLUID

## 2011-12-02 MED ORDER — AMIODARONE HCL 100 MG PO TABS: 100.0000 mg | ORAL_TABLET | Freq: Every day | ORAL | Status: DC

## 2011-12-02 MED ORDER — POLYSACCHARIDE IRON COMPLEX 150 MG PO CAPS: 150.0000 mg | ORAL_CAPSULE | Freq: Every day | ORAL | Status: DC

## 2011-12-02 MED ORDER — POTASSIUM CHLORIDE CRYS ER 20 MEQ PO TBCR: 20.0000 meq | EXTENDED_RELEASE_TABLET | Freq: Every day | ORAL | Status: DC

## 2011-12-02 MED ORDER — OXYCODONE HCL 5 MG PO TABS: 5.0000 mg | ORAL_TABLET | ORAL | Status: DC | PRN

## 2011-12-02 MED ORDER — FUROSEMIDE 40 MG PO TABS: 40.0000 mg | ORAL_TABLET | Freq: Every day | ORAL | Status: DC

## 2011-12-02 MED ORDER — AMIODARONE HCL 100 MG PO TABS
100.0000 mg | ORAL_TABLET | Freq: Every day | ORAL | Status: DC
  Administered 2011-12-02 – 2011-12-03 (×2): 100 mg via ORAL
  Filled 2011-12-02 (×2): qty 1

## 2011-12-02 MED ORDER — HYDROCHLOROTHIAZIDE 25 MG PO TABS: 25.0000 mg | ORAL_TABLET | Freq: Every day | ORAL | Status: DC

## 2011-12-02 MED ORDER — WARFARIN SODIUM 1 MG PO TABS
1.0000 mg | ORAL_TABLET | Freq: Once | ORAL | Status: DC
  Filled 2011-12-02: qty 1

## 2011-12-02 MED ORDER — WARFARIN SODIUM 2.5 MG PO TABS
2.5000 mg | ORAL_TABLET | ORAL | Status: DC
  Administered 2011-12-02: 2.5 mg via ORAL
  Filled 2011-12-02 (×2): qty 1

## 2011-12-02 MED ORDER — WARFARIN SODIUM 2.5 MG PO TABS: 2.5000 mg | ORAL_TABLET | Freq: Every day | ORAL | Status: DC

## 2011-12-02 MED ORDER — ASPIRIN 81 MG PO TBEC: 81.0000 mg | DELAYED_RELEASE_TABLET | Freq: Every day | ORAL | Status: DC

## 2011-12-02 MED ORDER — WARFARIN SODIUM 2.5 MG PO TABS: 3.7500 mg | ORAL_TABLET | ORAL | Status: DC

## 2011-12-02 NOTE — Progress Notes (Addendum)
                   [address]            [name] [address]          [phone]      6 Days Post-Op Procedure(s) (LRB): MAZE (N/A) CORONARY ARTERY BYPASS GRAFTING (CABG) (N/A)  Subjective: Patient wants to go home.  Objective: Vital signs in last 24 hours: Patient Vitals for the past 24 hrs:  BP Temp Temp src Pulse Resp SpO2  12/02/11 0607 154/70 mmHg 97.7 F (36.5 C) Oral 76  18  91 %  12/01/11 2230 - - - 70  18  -  12/01/11 1957 149/67 mmHg 98.7 F (37.1 C) Oral 77  18  93 %  12/01/11 1453 143/58 mmHg - - 71  - -  12/01/11 1333 135/74 mmHg 98.3 F (36.8 C) Oral 73  18  98 %  12/01/11 1025 133/71 mmHg - - 70  - -  12/01/11 0930 128/58 mmHg 97.8 F (36.6 C) Oral 67  16  93 %  12/01/11 0759 - - - - - 95 %   Pre op weight  88.5 kg Current Weight  12/01/11 206 lb (93.441 kg)      Intake/Output from previous day: 10/07 0701 - 10/08 0700 In: 720 [P.O.:720] Out: 1301 [Urine:1300; Stool:1]   Physical Exam:  Cardiovascular: RRR Pulmonary: Slightly diminished at bases; no rales, wheezes, or rhonchi. Abdomen: Soft, non tender, bowel sounds present. Extremities: Mild bilateral lower extremity edema. Wound: Clean and dry.  No erythema or signs of infection.  Lab Results: CBC:No results found for this basename: WBC:2,HGB:2,HCT:2,PLT:2 in the last 72 hours BMET: No results found for this basename: NA:2,K:2,CL:2,CO2:2,GLUCOSE:2,BUN:2,CREATININE:2,CALCIUM:2 in the last 72 hours  PT/INR:  Lab Results  Component Value Date   INR 1.93* 12/02/2011   INR 1.64* 12/01/2011   INR 1.56* 11/30/2011   ABG:  INR: Will add last result for INR, ABG once components are confirmed Will add last 4 CBG results once components are confirmed  Assessment/Plan:  1. CV - Sinus node dysfunction, but is recovering.On Coumadin, Norvasc 5 daily, Enalapril 10 daily.Will give 1 mg Coumadin tnight as INR went from 1.64 to 1.93 2.  Pulmonary - Encourage incentive spirometer.Wean  off oxygen as tolerates.Last CXR was stable (mild cardiomegaly, no ptx, small bilateral pleural effusions, and atelectasis on left). 3. Volume Overload - Continue diuresis. 4.  Acute blood loss anemia - Last H and H 8.2 and 24.2.Continue Nu Iron 5.Remove EPW and chest tube sutures 6.Possible discharge later today vs am   ZIMMERMAN,DONIELLE MPA-C 12/02/2011,7:37 AM      I have seen and examined the patient and agree with the assessment and plan as outlined.  Will check repeat CXR this am to f/u pleural effusions as I appreciate somewhat diminished breath sounds on right side.  If effusions remain stable will d/c home later today.  There is no reason to change coumadin dose - resume previous home dose at discharge.  Will restart low dose amiodarone today (100 mg/day) but continue to hold beta blocker.  Continue lasix x 7 days then restart hydrodiuril.   OWEN,CLARENCE H 12/02/2011 8:12 AM

## 2011-12-02 NOTE — Progress Notes (Signed)
CARDIAC REHAB PHASE I   PRE:  Rate/Rhythm: 78 SR    BP: sitting 152/80    SaO2: 94 RA  MODE:  Ambulation: 550 ft   POST:  Rate/Rhythm: 100 ST    BP: sitting 166/73     SaO2: 96 RA  Tolerated well. Slight SOB but pt sts he feels good. Sts he regrets being "irritable" this morning with staff. Sts he had a bad night. Will f/u for ed. (to cxr now). [id number]  Harriet Masson CES, ACSM

## 2011-12-02 NOTE — Progress Notes (Signed)
   ELECTROPHYSIOLOGY ROUNDING NOTE    Patient Name: Scott [name] Date of Encounter: 12-02-2011    SUBJECTIVE:Patient feels well.  No chest pain, shortness of breath or dizziness.  He is anxious to go home.    TELEMETRY: Reviewed telemetry pt in sinus rhythm with intermittent junctional rhythm, no afib or pauses Filed Vitals:   12/01/11 1453 12/01/11 1957 12/01/11 2230 12/02/11 0607  BP: 143/58 149/67  154/70  Pulse: 71 77 70 76  Temp:  98.7 F (37.1 C)  97.7 F (36.5 C)  TempSrc:  Oral  Oral  Resp:  18 18 18   Height:      Weight:      SpO2:  93%  91%    Intake/Output Summary (Last 24 hours) at 12/02/11 0719 Last data filed at 12/01/11 1700  Gross per 24 hour  Intake    720 ml  Output   1301 ml  Net   -581 ml   GEN- The patient is well appearing, alert and oriented x 3 today.   Head- normocephalic, atraumatic Eyes-  Sclera clear, conjunctiva pink Ears- hearing intact Oropharynx- clear Neck- supple  Lungs- Clear to ausculation bilaterally, normal work of breathing Heart- Regular rate and rhythm, no murmurs, rubs or gallops, PMI not laterally displaced GI- soft, NT, ND, + BS Extremities- no clubbing, cyanosis, or edema   Labs: INR- 1.93  Current Medications: . acetaminophen  1,000 mg Oral Q6H  . amLODipine  5 mg Oral Daily  . aspirin EC  81 mg Oral Daily  . bisacodyl  10 mg Oral Daily  . budesonide-formoterol  2 puff Inhalation BID  . citalopram  20 mg Oral Daily  . docusate sodium  200 mg Oral Daily  . enalapril  10 mg Oral Daily  . furosemide  40 mg Oral BID  . iron polysaccharides  150 mg Oral Daily  . pantoprazole  40 mg Oral Q1200  . potassium chloride  20 mEq Oral BID  . simvastatin  40 mg Oral q1800  . sodium chloride  3 mL Intravenous Q12H  . warfarin  2.5 mg Oral Custom    Assessment and Plan:    1. afib- doing well s/p MAZE 2. Bradycardia- sinus node appears to be recovering nicely.  Much less junctional rhythm at this point.  I am happy  to follow this as an outpatient.  I do not anticipate that he will require PPM at this time.  Likely to discharge soon I will see in the office. Please call with questions.  Fayrene Fearing Clemmie Buelna,MD

## 2011-12-02 NOTE — Op Note (Signed)
CARDIOTHORACIC SURGERY OPERATIVE NOTE  Date of Procedure:  12/02/2011  Preoperative Diagnosis: Right Pleural Effusion  Postoperative Diagnosis: Same  Procedure:   Right Thoracentesis  Surgeon:   [name]. Cornelius Moras, MD  Anesthesia:   1% lidocaine local    DETAILS OF THE OPERATIVE PROCEDURE  Following full informed consent the patient was placed in the upright sitting position and continuously monitored for rhythm, BP and oxygen saturation. The right posterior chest was prepared and draped in a sterile manner. 1% lidocaine was utilized to anesthetize the skin and subcutaneous tissues overlying approximately the 9th intercostal space in the midscapular line.   A thoracentesis catheter was passed into the pleural space and approximately 800 mL of dark serosanguinous fluid was evacuated uneventfully.  The patient tolerated the procedure well. A portable CXR was ordered. There were no complications.    [name]. Cornelius Moras, MD 12/02/2011 4:03 PM

## 2011-12-02 NOTE — Discharge Summary (Signed)
I agree with the above discharge summary and plan for follow-up.  [name]  

## 2011-12-02 NOTE — Progress Notes (Signed)
Ed completed. Pt requests his name be sent to [facility]. Will give financial aid application. [phone] Ethelda [name] CES, ACSM

## 2011-12-02 NOTE — Progress Notes (Signed)
Per Dr. Cornelius Moras will wait to d/c EPW and CT sutures until the am. Right Thoracentesis performed at bedside. Band aid to site. Site WNL. Scott [name]

## 2011-12-02 NOTE — Discharge Summary (Addendum)
Physician Discharge Summary  Patient ID: Scott [name] MRN: [id number] DOB/AGE: October 20, 1955 56 y.o.  Admit date: 11/24/2011 Discharge date: 12/03/2011  Admission Diagnoses: 1.Single vessel CAD 2.History of afib (s/p ablation x 2, on Coumadin) 3.History of hyperlipidemia 4.History of hypertension 5.History of OSA 6.History of COPD 7. History of chronic diastolic dysfunction 8.History of hypothyroidism 9.History of tobacco abuse  Discharge Diagnoses:  1.Single vessel CAD 2.History of afib (s/p ablation x 2, on Coumadin) 3.History of hyperlipidemia 4.History of hypertension 5.History of OSA 6.History of COPD 7. History of chronic diastolic dysfunction 8.History of hypothyroidism 9.History of tobacco abuse 10.ABL anemia 11.Thrombocytopenia   Procedure (s):  1.Cardiac catheterization done by Dr. [place] on 11/24/2011: Coronary dominance: right  Left mainstem: There is mild calcification of the left main. The distal left main there is a 20-30% acentric stenosis.  Left anterior descending (LAD): The LAD is mild to moderately calcified. There is nonobstructive disease in the proximal vessel up to 20%. The mid vessel is somewhat diffusely diseased up to 30%. The first diagonal is a moderate size vessel which trifurcates. It has minor irregularities.  Left circumflex (LCx): The left circumflex gives rise to 2 marginal branches before terminating in the AV groove. It has minor irregularities less than 10%.  Right coronary artery (RCA): The right coronary is a large dominant vessel. In the proximal vessel there is a focal 80-90% stenosis. It is moderately calcified. The remainder the vessel is without significant disease.  Left ventriculography: Left ventricular systolic function is normal, LVEF is estimated at 55-65%, there is no significant mitral regurgitation  Descending thoracic and abdominal aortography demonstrates no focal aneurysm. There is no significant obstructive aortoiliac  disease. The renal arteries appear without significant stenosis.  2.Maze Procedure  complete biatrial lesion set with clipping of left atrial appendage Coronary Artery Bypass Grafting x 1  Right Internal Mammary Artery to Distal Right Coronary Artery by Dr. Cornelius Moras 11/26/2011  3.Right thoracentesis performed by Dr. Cornelius Moras on 12/02/2011  History of Presenting Illness: This is a 56 yo Caucasian male with a past medical history significant for paroxysmal atrial fibrillation (s/p multiples RFAs for atrial fibrillation/flutter 2005-2011), chronic diastolic dysfunction, HTN, HL, hypothyroidism, COPD, OSA and history of tobacco abuse. Last echo on 09/2011 revealed LVEF 55-60%, grade 1 diastolic dysfunction, mild LVH, no WMAs. He has had multiple RFAs for atrial fibrillation/flutter from 2005 to 2011. Normal nuclear stress test 02/2010- normal LV function with scar or ischemia.  He is currently on amiodarone and chronic Coumadin. He reports doing well for a period of 6-7 months after each RFA procedure, but then experiencing recurrent, paroxysmal episodes of atrial fibrillation with symptoms consisting of shortness of breath, palpitations, fatigue and decreased functional capacity. He followed up with Dr. Johney Frame in 09/2011 at which time the decision was made to pursue further invasive therapy in the form of MAZE operation as medical options were exhausted.        He was referred to Dr. Cornelius Moras for surgical evaluation/consideration. PFTs were reviewed and deemed satisfactory. Options were discussed with the patient, and the decision was made to proceed with minimally invasive MAZE procedure. During this time and since follow-up, he denied chest pain, lightheadedness, PND, orthopnea, LE edema, n/v/d, fevers, chills or active bleeding. No prior CAD history. He presented on 11/24/2011 for pre-op cardiac catheterization. Of note, Dr. Cornelius Moras requested radial or left groin access with imaging of thoracic descending and abdominal  aorta to be performed to rule out significant aortoiliac occlusive disease. Coumadin has  been held since Friday 09/27. He was found to have single vessel CAD with a 80-90% RCA stenosis and LVEF 55-65%. It was then discussed with the patient the need to undergo a CABG x 1 and Maze. Pre op carotid US showed no evidence of bilaterally carotid artery stenosis.Potential risks, complications, and benefits were discussed with the patient and he agreed to proceed.He underwent a CABG x 1 and Maze on 11/26/2011.  Brief Hospital Course:  He was extubated without difficulty on the evening of surgery. He remained afebrile and hemodynamically stable. His Theone Murdoch, a line, chest tubes, and foley were removed early in his post operative course.He was AAI paced for several days post op as he had sinus node dysfunction. He was not put on a BB b/c of this. He was found to be volume overloaded and diuresed accordingly. He was started on Coumadin and his PT and INR were monitored daily. He was found to have ABL anemia. He did not require a post op transfusion. He was placed on Nu Iron. His last H and H was  8.2 and 24.2 He also had thrombocytopenia. His last platelet count was 125,000. He was felt surgically stable for transfer from the ICU to [facility] for further convalescence on 11/28/2011. His sinus node did recover and currently his HR is in the 80's and he is in sinus rhythm. He has had junctional rhythm at times. He has already been tolerating a diet and has had a bowel movement. His epicardial pacing wires and chest tube sutures will be removed today.Dr. Cornelius Moras has seen and evaluated him today. He is going to restart Amiodarone 100 mg daily and well restart HCTZ after his lasix is finished. CXR done today shows a moderate right pleural effusion, small left pleural effusion, and no pneumothorax.He is going to undergo a right thoracentesis at the bedside. There was 800 cc of dark sero sanguinous fluid removed. Follow up CXR showed  no ptx and small bilateral pleural effusions. His CXR this am again is stable. He will be discharged today.  Latest Vital Signs: Blood pressure 154/70, pulse 76, temperature 97.7 F (36.5 C), temperature source Oral, resp. rate 18, height 5\' 6"  (1.676 m), weight 206 lb (93.441 kg), SpO2 91.00%.  Physical Exam: Cardiovascular: RRR  Pulmonary: Mostly clear; no rales, wheezes, or rhonchi.  Abdomen: Soft, non tender, bowel sounds present.  Extremities: Mild bilateral lower extremity edema.  Wound: Clean and dry. No erythema or signs of infection.   Discharge Condition:Stable  Recent laboratory studies:  Lab Results  Component Value Date   WBC 9.5 11/29/2011   HGB 8.2* 11/29/2011   HCT 24.2* 11/29/2011   MCV 93.1 11/29/2011   PLT 125* 11/29/2011   Lab Results  Component Value Date   NA 139 11/29/2011   K 3.9 11/29/2011   CL 105 11/29/2011   CO2 28 11/29/2011   CREATININE 1.05 11/29/2011   GLUCOSE 115* 11/29/2011      Diagnostic Studies: Dg Chest 2 View  Dg Chest 2 View  12/02/2011  *RADIOLOGY REPORT*  Clinical Data: CABG 1 week ago.  Follow up pleural effusion.  CHEST - 2 VIEW  Comparison: 12/01/2011  Findings: Small pleural effusions bilaterally are unchanged.  Mild bibasilar atelectasis is unchanged.  Pulmonary vascularity is normal and there is no edema.  Postop changes of median sternotomy and left atrial appendage clip are noted and unchanged.  IMPRESSION: Stable small pleural effusions and bibasilar atelectasis.   Original Report Authenticated By: Camelia Phenes,  M.D.    Dg Chest Port 1 View  12/02/2011  *RADIOLOGY REPORT*  Clinical Data: Rule out pneumothorax status post thoracentesis  PORTABLE CHEST - 1 VIEW  Comparison:  12/02/2011 at 0932 hours  Findings: Hazy density at the right lung base identified suggesting continued pleural effusion.  Left costophrenic angle excluded from the field of view.  Heart size and vascular pattern normal.  No pneumothorax identified.  IMPRESSION: No  pneumothorax   Original Report Authenticated By: Otilio Carpen, M.D.     Discharge Orders    Future Appointments: Provider: Department: Dept Phone: Center:   12/12/2011 12:30 PM [name], MD [facility] [phone] [facility]   01/19/2012 9:30 AM [name], MD [facility] [phone] [facility]      Discharge Medications:   Medication List     As of 12/02/2011  9:17 AM    STOP taking these medications         HYDROcodone-acetaminophen 5-325 MG per tablet   Commonly known as: NORCO/VICODIN      nadolol 20 MG tablet   Commonly known as: CORGARD      TAKE these medications         ALPRAZolam 0.5 MG tablet   Commonly known as: XANAX   Take 0.5 mg by mouth 2 (two) times daily as needed. For anxiety      amiodarone 100 MG tablet   Commonly known as: PACERONE   Take 1 tablet (100 mg total) by mouth daily.      amLODipine 5 MG tablet   Commonly known as: NORVASC   Take 5 mg by mouth daily.      aspirin 81 MG EC tablet   Take 1 tablet (81 mg total) by mouth daily.      budesonide-formoterol 160-4.5 MCG/ACT inhaler   Commonly known as: SYMBICORT   Inhale 2 puffs into the lungs 2 (two) times daily.      citalopram 20 MG tablet   Commonly known as: CELEXA   Take 20 mg by mouth daily.      enalapril 10 MG tablet   Commonly known as: VASOTEC   Take 10 mg by mouth daily.      furosemide 40 MG tablet   Commonly known as: LASIX   Take 1 tablet (40 mg total) by mouth daily. For 7 days then stop.      hydrochlorothiazide 25 MG tablet   Commonly known as: HYDRODIURIL   Take 1 tablet (25 mg total) by mouth daily. DO NOT START until 12/11/2011      iron polysaccharides 150 MG capsule   Commonly known as: NIFEREX   Take 1 capsule (150 mg total) by mouth daily. For one month then stop.      lovastatin 40 MG tablet   Commonly known as: MEVACOR   Take 80 mg by mouth daily. Take two tablets by mouth once daily      oxyCODONE 5 MG  immediate release tablet   Commonly known as: Oxy IR/ROXICODONE   Take 1-2 tablets (5-10 mg total) by mouth every 4 (four) hours as needed for pain.      potassium chloride SA 20 MEQ tablet   Commonly known as: K-DUR,KLOR-CON   Take 1 tablet (20 mEq total) by mouth daily. For 7 days then stop.      PROAIR HFA 108 (90 BASE) MCG/ACT inhaler   Generic drug: albuterol   Inhale 2 puffs into the lungs every 4 (four) hours as needed. For  shortness of breath      temazepam 30 MG capsule   Commonly known as: RESTORIL   Take 30 mg by mouth at bedtime as needed. For sleep      warfarin 2.5 MG tablet   Commonly known as: COUMADIN   Take 2.5-3.75 mg by mouth daily. Take 3.75mg  (1&1/2 tablet) on Sun. Take 2.5mg  (1 tablet) all other days)        Follow Up Appointments:     Follow-up Information    Schedule an appointment as soon as possible for a visit with [name], MD. (To have a PT/INR drawn on Friday 12/05/2011)    Contact information:   [address], [address] [place] [address] [phone]       Follow up with Purcell Nails, MD. (PA/LAT CXR to be taken (at [facility] which is in the same building as Dr. Orvan July office) 1 hour prior to office appointment with Dr. Netta Neat will call you with an appointment date and time)    Contact information:   [address] [place] [address] [phone]       Follow up with [name], MD. On 12/12/2011. (Appointment time is at 12:30 pm)    Contact information:   [address] [place] [address] [phone]          Signed: Doree Fudge MPA-C 12/02/2011, 9:17 AM

## 2011-12-02 NOTE — Progress Notes (Signed)
IV site out of date. Dr. Cornelius Moras said okay to leave site in place because pt will be d/c home in the am. Chestine Spore, Bary Richard

## 2011-12-03 ENCOUNTER — Inpatient Hospital Stay (HOSPITAL_COMMUNITY): Payer: Medicaid Other

## 2011-12-03 DIAGNOSIS — I509 Heart failure, unspecified: Secondary | ICD-10-CM

## 2011-12-03 LAB — CBC
Hemoglobin: 9.4 g/dL — ABNORMAL LOW (ref 13.0–17.0)
Platelets: 241 10*3/uL (ref 150–400)
RBC: 3.02 MIL/uL — ABNORMAL LOW (ref 4.22–5.81)
WBC: 8.2 10*3/uL (ref 4.0–10.5)

## 2011-12-03 LAB — PATHOLOGIST SMEAR REVIEW

## 2011-12-03 LAB — PROTIME-INR
INR: 1.99 — ABNORMAL HIGH (ref 0.00–1.49)
Prothrombin Time: 21.8 seconds — ABNORMAL HIGH (ref 11.6–15.2)

## 2011-12-03 LAB — BASIC METABOLIC PANEL
CO2: 27 mEq/L (ref 19–32)
Chloride: 103 mEq/L (ref 96–112)
Glucose, Bld: 102 mg/dL — ABNORMAL HIGH (ref 70–99)
Potassium: 4.3 mEq/L (ref 3.5–5.1)
Sodium: 141 mEq/L (ref 135–145)

## 2011-12-03 MED ORDER — HYDROCHLOROTHIAZIDE 25 MG PO TABS: 25.0000 mg | ORAL_TABLET | Freq: Every day | ORAL | Status: DC

## 2011-12-03 NOTE — Progress Notes (Addendum)
                   [address]            [place] [address]          [phone]      7 Days Post-Op Procedure(s) (LRB): MAZE (N/A) CORONARY ARTERY BYPASS GRAFTING (CABG) (N/A)  Subjective: Patient feeling well, no complaints, and looking forward to going home.  Objective: Vital signs in last 24 hours: Patient Vitals for the past 24 hrs:  BP Temp Temp src Pulse Resp SpO2 Weight  12/03/11 0551 140/70 mmHg 97.2 F (36.2 C) Oral 81  16  94 % 194 lb 10.7 oz (88.3 kg)  12/02/11 2121 134/62 mmHg 98 F (36.7 C) Oral 83  18  93 % -  12/02/11 2014 - - - 85  20  - -  12/02/11 1559 123/65 mmHg - - 84  - 98 % -  12/02/11 1349 136/55 mmHg 97.5 F (36.4 C) Oral 79  18  97 % -  12/02/11 1000 128/64 mmHg 98.1 F (36.7 C) Oral 80  18  94 % -   Pre op weight  88.5 kg Current Weight  12/03/11 194 lb 10.7 oz (88.3 kg)      Intake/Output from previous day: 10/08 0701 - 10/09 0700 In: 360 [P.O.:360] Out: -    Physical Exam:  Cardiovascular: RRR Pulmonary: Mostly clear; no rales, wheezes, or rhonchi. Abdomen: Soft, non tender, bowel sounds present. Extremities: Mild bilateral lower extremity edema. Wound: Clean and dry.  No erythema or signs of infection.  Lab Results: CBC:  Basename 12/03/11 0434  WBC 8.2  HGB 9.4*  HCT 29.0*  PLT 241   BMET:   Basename 12/03/11 0434  NA 141  K 4.3  CL 103  CO2 27  GLUCOSE 102*  BUN 15  CREATININE 1.22  CALCIUM 9.1    PT/INR:  Lab Results  Component Value Date   INR 1.99* 12/03/2011   INR 1.93* 12/02/2011   INR 1.64* 12/01/2011   ABG:  INR: Will add last result for INR, ABG once components are confirmed Will add last 4 CBG results once components are confirmed  Assessment/Plan:  1. CV - Sinus node dysfunction, but is recovering.On Coumadin, Norvasc 5 daily, Enalapril 10 daily. 2.  Pulmonary - Encourage incentive spirometer.S/p right thoracentesis (800 cc ser sanguinous fluid removed). CXR this am shows  trace bilateral pleural effusions. 3. Volume Overload - Continue diuresis  4.  Acute blood loss anemia - Last H and H 8.2 and 24.2.Continue Nu Iron 5.Remove EPW and chest tube sutures 6.Discharge today   ZIMMERMAN,DONIELLE MPA-C 12/03/2011,7:29 AM      I have seen and examined the patient and agree with the assessment and plan as outlined.  Feels much better since thoracentesis.  Sinus node function has recovered as well.  D/C home.  OWEN,CLARENCE H 12/03/2011 8:56 AM

## 2011-12-03 NOTE — Progress Notes (Signed)
Patient ID: Scott [name], male   DOB: 03/13/1955, 56 y.o.   MRN: [id number] Subjective:  "I am supposed to go home today"  Objective:  Vital Signs in the last 24 hours: Temp:  [97.2 F (36.2 C)-98.1 F (36.7 C)] 97.2 F (36.2 C) (10/09 0551) Pulse Rate:  [79-85] 81  (10/09 0551) Resp:  [16-20] 16  (10/09 0551) BP: (123-140)/(55-70) 140/70 mmHg (10/09 0551) SpO2:  [93 %-98 %] 94 % (10/09 0551) Weight:  [194 lb 10.7 oz (88.3 kg)] 194 lb 10.7 oz (88.3 kg) (10/09 0551)  Intake/Output from previous day: 10/08 0701 - 10/09 0700 In: 360 [P.O.:360] Out: -  Intake/Output from this shift:    Physical Exam: Well appearing NAD HEENT: Unremarkable Neck:  No JVD, no thyromegally Lungs:  Clear with minimal rales. HEART:  Regular rate rhythm, no murmurs, no rubs, no clicks Abd:  Flat, positive bowel sounds, no organomegally, no rebound, no guarding Ext:  2 plus pulses, no edema, no cyanosis, no clubbing Skin:  No rashes no nodules Neuro:  CN II through XII intact, motor grossly intact  Lab Results:  Basename 12/03/11 0434  WBC 8.2  HGB 9.4*  PLT 241    Basename 12/03/11 0434  NA 141  K 4.3  CL 103  CO2 27  GLUCOSE 102*  BUN 15  CREATININE 1.22   No results found for this basename: TROPONINI:2,CK,MB:2 in the last 72 hours Hepatic Function Panel No results found for this basename: PROT,ALBUMIN,AST,ALT,ALKPHOS,BILITOT,BILIDIR,IBILI in the last 72 hours No results found for this basename: CHOL in the last 72 hours No results found for this basename: PROTIME in the last 72 hours  Imaging: Dg Chest 2 View  12/02/2011  *RADIOLOGY REPORT*  Clinical Data: CABG 1 week ago.  Follow up pleural effusion.  CHEST - 2 VIEW  Comparison: 12/01/2011  Findings: Small pleural effusions bilaterally are unchanged.  Mild bibasilar atelectasis is unchanged.  Pulmonary vascularity is normal and there is no edema.  Postop changes of median sternotomy and left atrial appendage clip are noted and  unchanged.  IMPRESSION: Stable small pleural effusions and bibasilar atelectasis.   Original Report Authenticated By: Camelia Phenes, M.D.    Dg Chest Port 1 View  12/02/2011  *RADIOLOGY REPORT*  Clinical Data: Rule out pneumothorax status post thoracentesis  PORTABLE CHEST - 1 VIEW  Comparison:  12/02/2011 at 0932 hours  Findings: Hazy density at the right lung base identified suggesting continued pleural effusion.  Left costophrenic angle excluded from the field of view.  Heart size and vascular pattern normal.  No pneumothorax identified.  IMPRESSION: No pneumothorax   Original Report Authenticated By: Otilio Carpen, M.D.     Cardiac Studies: Tele - NSR! Assessment/Plan:  1. S/p CABG/MAZE for persistant atrial fib, s/p prior PVI 2. CAD 3. Prior non-ischemic CM with normalization of LV function. 4. Diastolic heart failure Rec: he looks good and is maintaining NSR. Ok for discharge on current meds. I would like to see in my office in 3-4 weeks.  LOS: 9 days    Gregg Taylor,M.D. 12/03/2011, 8:27 AM

## 2011-12-03 NOTE — Progress Notes (Signed)
D/C EPW & CTS per protocol and as ordered, all ends intact, no bleeding/ectopy noted. Steris applied to suture sites. Pt tolerated well, reminded to lie supine approximately one hour.  INR 1.99, VSS.  Will continue to monitor. Ninetta Lights RN

## 2011-12-03 NOTE — Discharge Summary (Signed)
I agree with the above discharge summary and plan for follow-up.  Scott [name],Scott [name]  

## 2011-12-03 NOTE — Progress Notes (Signed)
Pt d/c home with instructions, r/x, and f/u appointments, pt verbalized understanding of instructions, home with friend. Pt escorted out by [employer].

## 2011-12-03 NOTE — Progress Notes (Signed)
CARDIAC REHAB PHASE I   PRE:  Rate/Rhythm: 66 SR    BP: sitting 130/62    SaO2: 98 RA  MODE:  Ambulation: 890 ft   POST:  Rate/Rhythm: 110 ST    BP: sitting 152/68     SaO2: 99 RA  Steady, long distance independently. Sts he feels good. HR up to 110 ST from 66 at rest. Noted SOB, pt sts its not bad. To recliner. All questions answered. [id number]  Harriet Masson CES, ACSM

## 2011-12-05 ENCOUNTER — Telehealth: Payer: Self-pay

## 2011-12-05 ENCOUNTER — Ambulatory Visit (INDEPENDENT_AMBULATORY_CARE_PROVIDER_SITE_OTHER): Payer: Self-pay | Admitting: *Deleted

## 2011-12-05 DIAGNOSIS — Z7901 Long term (current) use of anticoagulants: Secondary | ICD-10-CM

## 2011-12-05 DIAGNOSIS — I4892 Unspecified atrial flutter: Secondary | ICD-10-CM

## 2011-12-05 DIAGNOSIS — I4891 Unspecified atrial fibrillation: Secondary | ICD-10-CM

## 2011-12-05 LAB — BODY FLUID CULTURE

## 2011-12-05 LAB — POCT INR: INR: 2.6

## 2011-12-05 MED ORDER — TEMAZEPAM 30 MG PO CAPS: 30.0000 mg | ORAL_CAPSULE | Freq: Every evening | ORAL | Status: DC | PRN

## 2011-12-05 MED ORDER — ALPRAZOLAM 0.5 MG PO TABS: 0.5000 mg | ORAL_TABLET | Freq: Two times a day (BID) | ORAL | Status: DC | PRN

## 2011-12-05 NOTE — Telephone Encounter (Signed)
Done hardcopy to robin  

## 2011-12-05 NOTE — Telephone Encounter (Signed)
Message copied by Pincus Sanes on Fri Dec 05, 2011  1:21 PM ------      Message from: MUSE, [place] J      Created: Fri Dec 05, 2011  1:18 PM      Regarding: Med Refill       Pt discharged home on 12/03/11 s/p CABG and Maze procedure. He was seen in coumadin clinic today. He is requesting a refill on his Alprazolam 0.5mg s and Temazepam 30mg s to be sent to [facility] on [address]. Please call pt at # [phone] for any questions.

## 2011-12-05 NOTE — Telephone Encounter (Signed)
Faxed hardcopy to pharmacy and called left detailed msg. rx sent in.

## 2011-12-07 ENCOUNTER — Encounter (HOSPITAL_COMMUNITY): Payer: Self-pay | Admitting: Emergency Medicine

## 2011-12-07 ENCOUNTER — Emergency Department (HOSPITAL_COMMUNITY): Payer: Medicaid Other

## 2011-12-07 ENCOUNTER — Inpatient Hospital Stay (HOSPITAL_COMMUNITY)
Admission: EM | Admit: 2011-12-07 | Discharge: 2011-12-08 | DRG: 392 | Disposition: A | Discharge status: Home / Self Care | Payer: Medicaid Other | Attending: Internal Medicine | Admitting: Internal Medicine

## 2011-12-07 DIAGNOSIS — N289 Disorder of kidney and ureter, unspecified: Secondary | ICD-10-CM

## 2011-12-07 DIAGNOSIS — D6832 Hemorrhagic disorder due to extrinsic circulating anticoagulants: Secondary | ICD-10-CM

## 2011-12-07 DIAGNOSIS — Z7982 Long term (current) use of aspirin: Secondary | ICD-10-CM

## 2011-12-07 DIAGNOSIS — K2970 Gastritis, unspecified, without bleeding: Secondary | ICD-10-CM | POA: Diagnosis present

## 2011-12-07 DIAGNOSIS — I4891 Unspecified atrial fibrillation: Secondary | ICD-10-CM | POA: Diagnosis present

## 2011-12-07 DIAGNOSIS — E860 Dehydration: Secondary | ICD-10-CM

## 2011-12-07 DIAGNOSIS — Z7901 Long term (current) use of anticoagulants: Secondary | ICD-10-CM

## 2011-12-07 DIAGNOSIS — E785 Hyperlipidemia, unspecified: Secondary | ICD-10-CM | POA: Diagnosis present

## 2011-12-07 DIAGNOSIS — Z79899 Other long term (current) drug therapy: Secondary | ICD-10-CM

## 2011-12-07 DIAGNOSIS — R109 Unspecified abdominal pain: Secondary | ICD-10-CM

## 2011-12-07 DIAGNOSIS — D72829 Elevated white blood cell count, unspecified: Secondary | ICD-10-CM | POA: Diagnosis present

## 2011-12-07 DIAGNOSIS — I2510 Atherosclerotic heart disease of native coronary artery without angina pectoris: Secondary | ICD-10-CM | POA: Diagnosis present

## 2011-12-07 DIAGNOSIS — N179 Acute kidney failure, unspecified: Secondary | ICD-10-CM | POA: Diagnosis present

## 2011-12-07 DIAGNOSIS — F411 Generalized anxiety disorder: Secondary | ICD-10-CM | POA: Diagnosis present

## 2011-12-07 DIAGNOSIS — E039 Hypothyroidism, unspecified: Secondary | ICD-10-CM | POA: Diagnosis present

## 2011-12-07 DIAGNOSIS — Z87891 Personal history of nicotine dependence: Secondary | ICD-10-CM

## 2011-12-07 DIAGNOSIS — J449 Chronic obstructive pulmonary disease, unspecified: Secondary | ICD-10-CM | POA: Diagnosis present

## 2011-12-07 DIAGNOSIS — Z951 Presence of aortocoronary bypass graft: Secondary | ICD-10-CM

## 2011-12-07 DIAGNOSIS — J4489 Other specified chronic obstructive pulmonary disease: Secondary | ICD-10-CM | POA: Diagnosis present

## 2011-12-07 DIAGNOSIS — K5289 Other specified noninfective gastroenteritis and colitis: Principal | ICD-10-CM | POA: Diagnosis present

## 2011-12-07 DIAGNOSIS — D62 Acute posthemorrhagic anemia: Secondary | ICD-10-CM | POA: Diagnosis present

## 2011-12-07 DIAGNOSIS — G4733 Obstructive sleep apnea (adult) (pediatric): Secondary | ICD-10-CM | POA: Diagnosis present

## 2011-12-07 DIAGNOSIS — I10 Essential (primary) hypertension: Secondary | ICD-10-CM | POA: Diagnosis present

## 2011-12-07 LAB — COMPREHENSIVE METABOLIC PANEL
ALT: 19 U/L (ref 0–53)
AST: 24 U/L (ref 0–37)
Albumin: 3.4 g/dL — ABNORMAL LOW (ref 3.5–5.2)
Alkaline Phosphatase: 86 U/L (ref 39–117)
BUN: 24 mg/dL — ABNORMAL HIGH (ref 6–23)
Chloride: 99 mEq/L (ref 96–112)
Potassium: 4.1 mEq/L (ref 3.5–5.1)
Sodium: 137 mEq/L (ref 135–145)
Total Bilirubin: 0.7 mg/dL (ref 0.3–1.2)
Total Protein: 7.5 g/dL (ref 6.0–8.3)

## 2011-12-07 LAB — CBC
HCT: 32.6 % — ABNORMAL LOW (ref 39.0–52.0)
MCH: 30.7 pg (ref 26.0–34.0)
MCV: 93.7 fL (ref 78.0–100.0)
Platelets: 401 10*3/uL — ABNORMAL HIGH (ref 150–400)
RBC: 3.48 MIL/uL — ABNORMAL LOW (ref 4.22–5.81)
RDW: 14.4 % (ref 11.5–15.5)
WBC: 16.5 10*3/uL — ABNORMAL HIGH (ref 4.0–10.5)

## 2011-12-07 LAB — PROTIME-INR: Prothrombin Time: 26.8 seconds — ABNORMAL HIGH (ref 11.6–15.2)

## 2011-12-07 LAB — OCCULT BLOOD, POC DEVICE: Fecal Occult Bld: NEGATIVE

## 2011-12-07 LAB — URINALYSIS, ROUTINE W REFLEX MICROSCOPIC
Glucose, UA: NEGATIVE mg/dL
Hgb urine dipstick: NEGATIVE
Ketones, ur: 15 mg/dL — AB
Nitrite: NEGATIVE
Specific Gravity, Urine: 1.024 (ref 1.005–1.030)
pH: 5.5 (ref 5.0–8.0)

## 2011-12-07 LAB — POCT I-STAT TROPONIN I

## 2011-12-07 LAB — APTT: aPTT: 41 seconds — ABNORMAL HIGH (ref 24–37)

## 2011-12-07 MED ORDER — SODIUM CHLORIDE 0.9 % IV BOLUS (SEPSIS)
1000.0000 mL | Freq: Once | INTRAVENOUS | Status: AC
  Administered 2011-12-07: 1000 mL via INTRAVENOUS

## 2011-12-07 MED ORDER — ONDANSETRON HCL 4 MG/2ML IJ SOLN
4.0000 mg | Freq: Once | INTRAMUSCULAR | Status: AC
  Administered 2011-12-07: 4 mg via INTRAVENOUS
  Filled 2011-12-07: qty 2

## 2011-12-07 MED ORDER — MORPHINE SULFATE 4 MG/ML IJ SOLN
4.0000 mg | Freq: Once | INTRAMUSCULAR | Status: AC
  Administered 2011-12-07: 4 mg via INTRAVENOUS
  Filled 2011-12-07: qty 1

## 2011-12-07 NOTE — ED Notes (Addendum)
C/o nausea and vomiting x 20 min. Pt states he had cardiac bypass surgery on Wednesday. Reports chest and abd pain that started 20 min ago also.  Pt pale.

## 2011-12-07 NOTE — ED Notes (Signed)
Pt returned from CT °

## 2011-12-07 NOTE — ED Provider Notes (Cosign Needed)
History     CSN: [id number]  Arrival date & time 12/07/11  1754   First MD Initiated Contact with Patient 12/07/11 1826      Chief Complaint  Patient presents with  . Nausea  . Emesis  . Chest Pain  . Abdominal Pain    (Consider location/radiation/quality/duration/timing/severity/associated sxs/prior treatment) HPI  The patient reports he had bypass surgery done he thinks about 12 days ago. Looking at his record it was September 30. Patient had bypass grafting done to the RAD. He also had a maze procedure for atrial fibrillation/flutter. He reports he has had chronic problems with arrhythmia and has been cardioverted at least 4 times and had to ablate she does. He states his surgery was done by Dr. Cornelius Moras. He was discharged 2 days ago. He states he was feeling okay and earlier today he went to [employer] with his sister to get his medication. He states he was mainly sitting while they were waiting at the pharmacy. He states about 1-1/2 hours ago he started having abdominal pain and he had some vomiting with some dark fluid. He states he has black stools however they have been black why he was in the hospital and he was put on iron. He states his pain is in his left lower quadrant. He denies any fever. He states he does feel short of breath.  PCP Dr. Jonny Ruiz  Past Medical History  Diagnosis Date  . HYPOTHYROIDISM, ACQUIRED NEC 09/28/2006  . HYPERLIPIDEMIA 09/28/2006  . ANXIETY 09/28/2006  . OBSTRUCTIVE SLEEP APNEA 01/06/2008  . HYPERTENSION 09/28/2006  . Atrial fibrillation 09/28/2006    s/p afib ablation x 2  . Diastolic dysfunction 09/28/2006  . ALLERGIC RHINITIS 01/14/2007  . CHRONIC OBSTRUCTIVE PULMONARY DISEASE, ACUTE EXACERBATION 10/04/2008  . Long term current use of anticoagulant 04/08/2010  . Right knee DJD 07/09/2010  . Asbestos exposure   . Coronary artery disease 11/24/2011  . S/P CABG x 1 11/26/2011    Right internal mammary artery to right coronary artery  . S/P Maze operation for  atrial fibrillation 11/26/2011    complete biatrial lesion set with clipping of LA appendage    Past Surgical History  Procedure Date  . Hand surgury     left  . Radiofrequency ablation for atrial flutter 2005    CTI  . Radiofrequency ablation for atrial fibrillation 12/30/07 and 06/05/09    afib ablation x 2 by JA  . Maze 11/26/2011    Procedure: MAZE;  Surgeon: Purcell Nails, MD;  Location: [facility] OR;  Service: Open Heart Surgery;  Laterality: N/A;  Cryomaze   . Coronary artery bypass graft 11/26/2011    Procedure: CORONARY ARTERY BYPASS GRAFTING (CABG);  Surgeon: Purcell Nails, MD;  Location: [facility] OR;  Service: Open Heart Surgery;  Laterality: N/A;  coronary artery bypass on pump times one utilizing the right internal mammary artery, transesophageal echocardiogram     Family History  Problem Relation Age of Onset  . Dementia Father   . Hypertension Father   . Atrial fibrillation Mother   . Stroke Mother     History  Substance Use Topics  . Smoking status: Former Smoker -- 1.5 packs/day for 26 years    Types: Cigarettes    Quit date: 04/06/1995  . Smokeless tobacco: Not on file   Comment: not smoked over 10 years  . Alcohol Use: No   Lives at home   Review of Systems  All other systems reviewed and are negative.  Allergies  Zolpidem tartrate  Home Medications   Current Outpatient Rx  Name Route Sig Dispense Refill  . ALBUTEROL SULFATE HFA 108 (90 BASE) MCG/ACT IN AERS Inhalation Inhale 2 puffs into the lungs every 4 (four) hours as needed. For shortness of breath    . ALPRAZOLAM 0.5 MG PO TABS Oral Take 1 tablet (0.5 mg total) by mouth 2 (two) times daily as needed. For anxiety 60 tablet 2  . AMIODARONE HCL 100 MG PO TABS Oral Take 1 tablet (100 mg total) by mouth daily. 30 tablet 1  . AMLODIPINE BESYLATE 5 MG PO TABS Oral Take 5 mg by mouth daily.    . ASPIRIN 81 MG PO TBEC Oral Take 1 tablet (81 mg total) by mouth daily.    . BUDESONIDE-FORMOTEROL FUMARATE  160-4.5 MCG/ACT IN AERO Inhalation Inhale 2 puffs into the lungs 2 (two) times daily.    Marland Kitchen CITALOPRAM HYDROBROMIDE 20 MG PO TABS Oral Take 20 mg by mouth daily.    . ENALAPRIL MALEATE 10 MG PO TABS Oral Take 10 mg by mouth daily.    . FUROSEMIDE 40 MG PO TABS Oral Take 1 tablet (40 mg total) by mouth daily. For 7 days then stop. 7 tablet 0  . HYDROCHLOROTHIAZIDE 25 MG PO TABS Oral Take 1 tablet (25 mg total) by mouth daily. DO NOT START until 12/11/2011    . POLYSACCHARIDE IRON COMPLEX 150 MG PO CAPS Oral Take 1 capsule (150 mg total) by mouth daily. For one month then stop. 30 capsule 0  . LOVASTATIN 40 MG PO TABS Oral Take 80 mg by mouth daily. Take two tablets by mouth once daily    . OXYCODONE HCL 5 MG PO TABS Oral Take 1-2 tablets (5-10 mg total) by mouth every 4 (four) hours as needed for pain. 30 tablet 0  . POTASSIUM CHLORIDE CRYS ER 20 MEQ PO TBCR Oral Take 1 tablet (20 mEq total) by mouth daily. For 7 days then stop. 7 tablet 0  . TEMAZEPAM 30 MG PO CAPS Oral Take 1 capsule (30 mg total) by mouth at bedtime as needed. For sleep 30 capsule 2  . WARFARIN SODIUM 2.5 MG PO TABS Oral Take 2.5-3.75 mg by mouth daily. Take 3.75mg  (1&1/2 tablet) on Sun. Take 2.5mg  (1 tablet) all other days)      BP 112/61  Pulse 84  Temp 97.4 F (36.3 C) (Oral)  Resp 17  SpO2 99%  Vital signs normal    Physical Exam  Nursing note and vitals reviewed. Constitutional: He is oriented to person, place, and time. He appears well-developed and well-nourished.  Non-toxic appearance. He does not appear ill. He appears distressed.       Dry tongue  HENT:  Head: Normocephalic and atraumatic.  Right Ear: External ear normal.  Left Ear: External ear normal.  Nose: Nose normal. No mucosal edema or rhinorrhea.  Mouth/Throat: Mucous membranes are normal. No dental abscesses or uvula swelling.  Eyes: Conjunctivae normal and EOM are normal. Pupils are equal, round, and reactive to light.  Neck: Normal range of  motion and full passive range of motion without pain. Neck supple.  Cardiovascular: Normal rate, regular rhythm and normal heart sounds.  Exam reveals no gallop and no friction rub.   No murmur heard. Pulmonary/Chest: Effort normal and breath sounds normal. No respiratory distress. He has no wheezes. He has no rhonchi. He has no rales. He exhibits no tenderness and no crepitus.  Abdominal: Soft. Normal appearance and bowel  sounds are normal. He exhibits no distension. There is tenderness. There is no rebound and no guarding.    Musculoskeletal: Normal range of motion. He exhibits no edema and no tenderness.       Moves all extremities well.   Neurological: He is alert and oriented to person, place, and time. He has normal strength. No cranial nerve deficit.  Skin: Skin is warm, dry and intact. No rash noted. No erythema. There is pallor.  Psychiatric: His speech is normal and behavior is normal. His mood appears not anxious.       Flat affect    ED Course  Procedures (including critical care time)   Medications  sodium chloride 0.9 % bolus 1,000 mL (1000 mL Intravenous Given 12/07/11 1849)  ondansetron (ZOFRAN) injection 4 mg (4 mg Intravenous Given 12/07/11 1849)  morphine 4 MG/ML injection 4 mg (4 mg Intravenous Given 12/07/11 1850)  sodium chloride 0.9 % bolus 1,000 mL (1000 mL Intravenous Given 12/07/11 2059)  morphine 4 MG/ML injection 4 mg (4 mg Intravenous Given 12/07/11 2325)  ondansetron (ZOFRAN) injection 4 mg (4 mg Intravenous Given 12/07/11 2325)   20:27 Dr Cornelius Moras, states to get CT AP with oral contrast  Recheck 20:40 BP 102/54, blood pressure 84, pulse ox 99%. Patient's color better after 1 L of normal saline. Patient states he feels better and states his abdominal pain is still present but improved. He now relates he also walked his dogs to the park which was downhill and when he came back he was having a lot of difficulty. We discussed that he should wait until he goes to  cardiac rehabilitation to see what degree of activity he should be doing.  MN pt states he is starting to get abdominal pain again. Color is improved, sitting up on the side of his stretcher. Will have medicine admit for dehydration and worsening renal insufficiency and further investigation of his pain.   00:38 Dr Toniann Fail admit to tele, team 10  Results for orders placed during the hospital encounter of 12/07/11  CBC      Component Value Range   WBC 16.5 (*) 4.0 - 10.5 K/uL   RBC 3.48 (*) 4.22 - 5.81 MIL/uL   Hemoglobin 10.7 (*) 13.0 - 17.0 g/dL   HCT 14.7 (*) 82.9 - 56.2 %   MCV 93.7  78.0 - 100.0 fL   MCH 30.7  26.0 - 34.0 pg   MCHC 32.8  30.0 - 36.0 g/dL   RDW 13.0  86.5 - 78.4 %   Platelets 401 (*) 150 - 400 K/uL  SAMPLE TO BLOOD BANK      Component Value Range   Blood Bank Specimen SAMPLE AVAILABLE FOR TESTING     Sample Expiration 12/08/2011    APTT      Component Value Range   aPTT 41 (*) 24 - 37 seconds  PROTIME-INR      Component Value Range   Prothrombin Time 26.8 (*) 11.6 - 15.2 seconds   INR 2.63 (*) 0.00 - 1.49  COMPREHENSIVE METABOLIC PANEL      Component Value Range   Sodium 137  135 - 145 mEq/L   Potassium 4.1  3.5 - 5.1 mEq/L   Chloride 99  96 - 112 mEq/L   CO2 22  19 - 32 mEq/L   Glucose, Bld 121 (*) 70 - 99 mg/dL   BUN 24 (*) 6 - 23 mg/dL   Creatinine, Ser 6.96 (*) 0.50 - 1.35 mg/dL   Calcium  9.6  8.4 - 10.5 mg/dL   Total Protein 7.5  6.0 - 8.3 g/dL   Albumin 3.4 (*) 3.5 - 5.2 g/dL   AST 24  0 - 37 U/L   ALT 19  0 - 53 U/L   Alkaline Phosphatase 86  39 - 117 U/L   Total Bilirubin 0.7  0.3 - 1.2 mg/dL   GFR calc non Af Amer 50 (*) >90 mL/min   GFR calc Af Amer 57 (*) >90 mL/min  LIPASE, BLOOD      Component Value Range   Lipase 52  11 - 59 U/L  URINALYSIS, ROUTINE W REFLEX MICROSCOPIC      Component Value Range   Color, Urine AMBER (*) YELLOW   APPearance CLOUDY (*) CLEAR   Specific Gravity, Urine 1.024  1.005 - 1.030   pH 5.5  5.0 - 8.0    Glucose, UA NEGATIVE  NEGATIVE mg/dL   Hgb urine dipstick NEGATIVE  NEGATIVE   Bilirubin Urine SMALL (*) NEGATIVE   Ketones, ur 15 (*) NEGATIVE mg/dL   Protein, ur NEGATIVE  NEGATIVE mg/dL   Urobilinogen, UA 1.0  0.0 - 1.0 mg/dL   Nitrite NEGATIVE  NEGATIVE   Leukocytes, UA NEGATIVE  NEGATIVE  POCT I-STAT TROPONIN I      Component Value Range   Troponin i, poc 0.37 (*) 0.00 - 0.08 ng/mL   Comment NOTIFIED PHYSICIAN     Comment 3           OCCULT BLOOD, POC DEVICE      Component Value Range   Fecal Occult Bld NEGATIVE     Laboratory interpretation all normal except mild anemia, therapeutic INR, worsening renal insuffic    Dg Chest Portable 1 View  12/07/2011  *RADIOLOGY REPORT*  Clinical Data: Nausea.  Emesis.  Chest and abdominal pain.  Rule out free intraperitoneal air.  Recent CABG.  PORTABLE CHEST - 1 VIEW  Comparison: 12/03/2011  Findings: Prior median sternotomy. Numerous leads and wires project over the chest.  Midline trachea.  Borderline cardiomegaly. Decreased small right pleural effusion.  Decreased or resolved left pleural fluid. No pneumothorax.  Mild pulmonary venous congestion which is not significantly changed.  Thickening of right minor fissure.  Mild subsegmental atelectasis at the bases. No free intraperitoneal air.  IMPRESSION:  1. No free intraperitoneal air. 2.  Overall improved aeration with decreased right and decreased to resolved left pleural fluid. 3.  Cardiomegaly with similar mild pulmonary venous congestion.   Original Report Authenticated By: Consuello Bossier, M.D.    Ct Abdomen Pelvis Wo Contrast  12/08/2011  *RADIOLOGY REPORT*  Clinical Data: Chest and abdominal pain.  Nausea and vomiting. Symptoms for 20 minutes.  CT ABDOMEN AND PELVIS WITHOUT CONTRAST  Technique:  Multidetector CT imaging of the abdomen and pelvis was performed following the standard protocol without intravenous contrast.  Comparison: 01/05/2005  Findings: Bilateral pleural effusions,  greater on the right. Atelectasis in the lung bases.  Postoperative changes in the mediastinum.  The unenhanced appearance of the liver, spleen, pancreas, adrenal glands, kidneys, and retroperitoneal lymph nodes is unremarkable. Increased density in the gallbladder may suggest sludge. Calcification of the abdominal aorta without aneurysm.  The stomach is filled with contrast material.  No gastric wall thickening.  The small bowel are decompressed.  Stool filled colon without distension.  No free air or free fluid in the abdomen.  Pelvis:  The prostate gland is not enlarged.  The bladder wall is not thickened.  No  free or loculated pelvic fluid collections. Rectosigmoid colon is decompressed and difficult to evaluate for wall thickness.  No evidence of diverticulitis.  No significant pelvic lymphadenopathy.  The appendix is not identified.  Mild degenerative changes in the lumbar spine and hips.  IMPRESSION: Increased density in the gallbladder may suggest sludge.  No stones or ductal dilatation identified.  Small bilateral pleural effusions and basilar atelectasis, greater on the right.  Postoperative changes in the mediastinum.   Original Report Authenticated By: Marlon Pel, M.D.         Date: 12/07/2011  Rate: 95  Rhythm: normal sinus rhythm  QRS Axis: normal  Intervals: QT prolonged  ST/T Wave abnormalities: normal  Conduction Disutrbances:none  Narrative Interpretation:   Old EKG Reviewed: unchanged from 12/03/2011    1. Dehydration   2. Abdominal pain   3. Warfarin-induced coagulopathy   4. Renal insufficiency    Plan admission  Devoria Albe, MD, FACEP    MDM           Ward Givens, MD 12/08/11 [id number]

## 2011-12-07 NOTE — ED Notes (Signed)
MD Knapp at bedside 

## 2011-12-07 NOTE — ED Notes (Signed)
Pt transported to CT ?

## 2011-12-08 ENCOUNTER — Encounter (HOSPITAL_COMMUNITY): Payer: Self-pay | Admitting: Internal Medicine

## 2011-12-08 DIAGNOSIS — R109 Unspecified abdominal pain: Secondary | ICD-10-CM | POA: Diagnosis present

## 2011-12-08 DIAGNOSIS — Z951 Presence of aortocoronary bypass graft: Secondary | ICD-10-CM

## 2011-12-08 DIAGNOSIS — R6889 Other general symptoms and signs: Secondary | ICD-10-CM

## 2011-12-08 DIAGNOSIS — I4891 Unspecified atrial fibrillation: Secondary | ICD-10-CM

## 2011-12-08 DIAGNOSIS — N289 Disorder of kidney and ureter, unspecified: Secondary | ICD-10-CM

## 2011-12-08 LAB — CBC WITH DIFFERENTIAL/PLATELET
Eosinophils Absolute: 0.2 10*3/uL (ref 0.0–0.7)
Eosinophils Relative: 2 % (ref 0–5)
Hemoglobin: 9 g/dL — ABNORMAL LOW (ref 13.0–17.0)
Lymphs Abs: 0.9 10*3/uL (ref 0.7–4.0)
MCH: 31.6 pg (ref 26.0–34.0)
MCHC: 33.5 g/dL (ref 30.0–36.0)
MCV: 94.4 fL (ref 78.0–100.0)
Monocytes Relative: 8 % (ref 3–12)
Platelets: 313 10*3/uL (ref 150–400)
RBC: 2.85 MIL/uL — ABNORMAL LOW (ref 4.22–5.81)

## 2011-12-08 LAB — COMPREHENSIVE METABOLIC PANEL
ALT: 31 U/L (ref 0–53)
AST: 49 U/L — ABNORMAL HIGH (ref 0–37)
Albumin: 3 g/dL — ABNORMAL LOW (ref 3.5–5.2)
Chloride: 104 mEq/L (ref 96–112)
Creatinine, Ser: 1.19 mg/dL (ref 0.50–1.35)
Sodium: 136 mEq/L (ref 135–145)
Total Bilirubin: 0.5 mg/dL (ref 0.3–1.2)

## 2011-12-08 MED ORDER — TEMAZEPAM 15 MG PO CAPS
30.0000 mg | ORAL_CAPSULE | Freq: Every evening | ORAL | Status: DC | PRN
Start: 2011-12-08 — End: 2011-12-08

## 2011-12-08 MED ORDER — OXYCODONE HCL 5 MG PO TABS: 5.0000 mg | ORAL_TABLET | ORAL | Status: DC | PRN

## 2011-12-08 MED ORDER — CITALOPRAM HYDROBROMIDE 20 MG PO TABS
20.0000 mg | ORAL_TABLET | Freq: Every day | ORAL | Status: DC
  Administered 2011-12-08: 20 mg via ORAL
  Filled 2011-12-08: qty 1

## 2011-12-08 MED ORDER — WARFARIN SODIUM 2.5 MG PO TABS
2.5000 mg | ORAL_TABLET | ORAL | Status: DC
Start: 2011-12-08 — End: 2011-12-08
  Filled 2011-12-08: qty 1

## 2011-12-08 MED ORDER — ASPIRIN EC 81 MG PO TBEC
81.0000 mg | DELAYED_RELEASE_TABLET | Freq: Every day | ORAL | Status: DC
  Administered 2011-12-08: 81 mg via ORAL
  Filled 2011-12-08: qty 1

## 2011-12-08 MED ORDER — ACETAMINOPHEN 325 MG PO TABS: 650.0000 mg | ORAL_TABLET | Freq: Four times a day (QID) | ORAL | Status: DC | PRN

## 2011-12-08 MED ORDER — SODIUM CHLORIDE 0.9 % IV SOLN
INTRAVENOUS | Status: DC
  Administered 2011-12-08: 04:00:00 via INTRAVENOUS

## 2011-12-08 MED ORDER — WARFARIN SODIUM 2.5 MG PO TABS: 3.7500 mg | ORAL_TABLET | ORAL | Status: DC

## 2011-12-08 MED ORDER — ONDANSETRON HCL 4 MG PO TABS: 4.0000 mg | ORAL_TABLET | Freq: Four times a day (QID) | ORAL | Status: DC | PRN

## 2011-12-08 MED ORDER — BUDESONIDE-FORMOTEROL FUMARATE 160-4.5 MCG/ACT IN AERO
2.0000 | INHALATION_SPRAY | Freq: Two times a day (BID) | RESPIRATORY_TRACT | Status: DC
  Filled 2011-12-08: qty 6

## 2011-12-08 MED ORDER — SODIUM CHLORIDE 0.9 % IJ SOLN
3.0000 mL | Freq: Two times a day (BID) | INTRAMUSCULAR | Status: DC
Start: 2011-12-08 — End: 2011-12-08
  Administered 2011-12-08: 3 mL via INTRAVENOUS

## 2011-12-08 MED ORDER — AMLODIPINE BESYLATE 5 MG PO TABS
5.0000 mg | ORAL_TABLET | Freq: Every day | ORAL | Status: DC
  Administered 2011-12-08: 5 mg via ORAL
  Filled 2011-12-08: qty 1

## 2011-12-08 MED ORDER — ONDANSETRON HCL 4 MG/2ML IJ SOLN: 4.0000 mg | Freq: Four times a day (QID) | INTRAMUSCULAR | Status: DC | PRN

## 2011-12-08 MED ORDER — MORPHINE SULFATE 2 MG/ML IJ SOLN: 1.0000 mg | INTRAMUSCULAR | Status: DC | PRN

## 2011-12-08 MED ORDER — SIMVASTATIN 20 MG PO TABS
20.0000 mg | ORAL_TABLET | Freq: Every day | ORAL | Status: DC
  Filled 2011-12-08: qty 1

## 2011-12-08 MED ORDER — ONDANSETRON HCL 4 MG/2ML IJ SOLN: 4.0000 mg | Freq: Three times a day (TID) | INTRAMUSCULAR | Status: DC | PRN

## 2011-12-08 MED ORDER — AMIODARONE HCL 100 MG PO TABS
100.0000 mg | ORAL_TABLET | Freq: Every day | ORAL | Status: DC
  Administered 2011-12-08: 100 mg via ORAL
  Filled 2011-12-08: qty 1

## 2011-12-08 MED ORDER — METRONIDAZOLE IN NACL 5-0.79 MG/ML-% IV SOLN
500.0000 mg | Freq: Three times a day (TID) | INTRAVENOUS | Status: DC
  Administered 2011-12-08: 500 mg via INTRAVENOUS
  Filled 2011-12-08 (×2): qty 100

## 2011-12-08 MED ORDER — CIPROFLOXACIN IN D5W 400 MG/200ML IV SOLN
400.0000 mg | INTRAVENOUS | Status: AC
  Administered 2011-12-08: 400 mg via INTRAVENOUS
  Filled 2011-12-08: qty 200

## 2011-12-08 MED ORDER — SODIUM CHLORIDE 0.9 % IV SOLN: INTRAVENOUS | Status: DC

## 2011-12-08 MED ORDER — ENALAPRIL MALEATE 10 MG PO TABS
10.0000 mg | ORAL_TABLET | Freq: Every day | ORAL | Status: DC
  Administered 2011-12-08: 10 mg via ORAL
  Filled 2011-12-08: qty 1

## 2011-12-08 MED ORDER — WARFARIN - PHARMACIST DOSING INPATIENT: Freq: Every day | Status: DC

## 2011-12-08 MED ORDER — MORPHINE SULFATE 4 MG/ML IJ SOLN: 4.0000 mg | INTRAMUSCULAR | Status: DC | PRN

## 2011-12-08 MED ORDER — CIPROFLOXACIN IN D5W 400 MG/200ML IV SOLN
400.0000 mg | Freq: Two times a day (BID) | INTRAVENOUS | Status: DC
  Filled 2011-12-08: qty 200

## 2011-12-08 MED ORDER — ACETAMINOPHEN 650 MG RE SUPP: 650.0000 mg | Freq: Four times a day (QID) | RECTAL | Status: DC | PRN

## 2011-12-08 MED ORDER — ALPRAZOLAM 0.5 MG PO TABS: 0.5000 mg | ORAL_TABLET | Freq: Two times a day (BID) | ORAL | Status: DC | PRN

## 2011-12-08 MED ORDER — POLYSACCHARIDE IRON COMPLEX 150 MG PO CAPS
150.0000 mg | ORAL_CAPSULE | Freq: Every day | ORAL | Status: DC
  Administered 2011-12-08: 150 mg via ORAL
  Filled 2011-12-08: qty 1

## 2011-12-08 NOTE — Progress Notes (Signed)
CARDIOTHORACIC SURGERY PROGRESS NOTE  Subjective: Scott [name] was readmitted last night with LLQ abdominal pain that has already completely resolved.  Abdominal CT unremarkable.  He had a normal BM yesterday and currently reports feeling quite well.  He did eat a fairly large meal at [facility] yesterday.  Objective: Vital signs in last 24 hours: Temp:  [97.4 F (36.3 C)-98 F (36.7 C)] 97.5 F (36.4 C) (10/14 0326) Pulse Rate:  [73-108] 87  (10/14 0326) Cardiac Rhythm:  [-] Normal sinus rhythm (10/14 0855) Resp:  [14-23] 17  (10/14 0326) BP: (102-130)/(49-71) 127/67 mmHg (10/14 0326) SpO2:  [91 %-100 %] 96 % (10/14 0326) Weight:  [97.115 kg (214 lb 1.6 oz)] 97.115 kg (214 lb 1.6 oz) (10/14 0123)  Physical Exam:  Rhythm:   sinus  Breath sounds: clear  Heart sounds:  RRR  Incisions:  Healing nicely  Abdomen:  Soft, non-distended, non-tender  Extremities:  Warm, well perfused   Intake/Output from previous day:   Intake/Output this shift:    Lab Results:  Basename 12/08/11 0525 12/07/11 1831  WBC 9.8 16.5*  HGB 9.0* 10.7*  HCT 26.9* 32.6*  PLT 313 401*   BMET:  Basename 12/08/11 0525 12/07/11 1841  NA 136 137  K 5.1 4.1  CL 104 99  CO2 21 22  GLUCOSE 103* 121*  BUN 18 24*  CREATININE 1.19 1.52*  CALCIUM 8.4 9.6    CBG (last 3)  No results found for this basename: GLUCAP:3 in the last 72 hours PT/INR:   Basename 12/08/11 0525  LABPROT 27.7*  INR 2.75*    CXR:  *RADIOLOGY REPORT*  Clinical Data: Nausea. Emesis. Chest and abdominal pain. Rule  out free intraperitoneal air. Recent CABG.  PORTABLE CHEST - 1 VIEW  Comparison: 12/03/2011  Findings: Prior median sternotomy. Numerous leads and wires project  over the chest. Midline trachea. Borderline cardiomegaly.  Decreased small right pleural effusion. Decreased or resolved left  pleural fluid. No pneumothorax. Mild pulmonary venous congestion  which is not significantly changed. Thickening of right minor    fissure. Mild subsegmental atelectasis at the bases. No free  intraperitoneal air.  IMPRESSION:  1. No free intraperitoneal air.  2. Overall improved aeration with decreased right and decreased to  resolved left pleural fluid.  3. Cardiomegaly with similar mild pulmonary venous congestion.  Original Report Authenticated By: Consuello Bossier, M.D.    CT ABDOMEN AND PELVIS WITHOUT CONTRAST  Technique: Multidetector CT imaging of the abdomen and pelvis was  performed following the standard protocol without intravenous  contrast.  Comparison: 01/05/2005  Findings: Bilateral pleural effusions, greater on the right.  Atelectasis in the lung bases. Postoperative changes in the  mediastinum.  The unenhanced appearance of the liver, spleen, pancreas, adrenal  glands, kidneys, and retroperitoneal lymph nodes is unremarkable.  Increased density in the gallbladder may suggest sludge.  Calcification of the abdominal aorta without aneurysm. The stomach  is filled with contrast material. No gastric wall thickening. The  small bowel are decompressed. Stool filled colon without  distension. No free air or free fluid in the abdomen.  Pelvis: The prostate gland is not enlarged. The bladder wall is  not thickened. No free or loculated pelvic fluid collections.  Rectosigmoid colon is decompressed and difficult to evaluate for  wall thickness. No evidence of diverticulitis. No significant  pelvic lymphadenopathy. The appendix is not identified. Mild  degenerative changes in the lumbar spine and hips.  IMPRESSION:  Increased density in the gallbladder may  suggest sludge. No stones  or ductal dilatation identified. Small bilateral pleural effusions  and basilar atelectasis, greater on the right. Postoperative  changes in the mediastinum.  Original Report Authenticated By: Marlon Pel, M.D.     Assessment/Plan:   Scott [name] seems fine and transient abdominal pain has resolved.  He otherwise is  recovering nicely from his recent surgery.  I agree with plans for d/c home.  I will see him in the office in 2 weeks or sooner if needed.   OWEN,CLARENCE H 12/08/2011 9:09 AM

## 2011-12-08 NOTE — Progress Notes (Addendum)
Pt seen and examined , admitted this am, had LUQ /diffuse abd pain yesterday evening, resolved since last pm Now asymptomatic CABG/MAZE on 10/2 per Dr.Owen, will notify CVTS since recent CABG ? Indigestion vs Mild gastroenteritis vs gastritis, CT non contrast unremarkable Will Dc IVF, DC ABx and observe If remains symptom free and tolerates lunch will DC home this afternoon  Zannie Cove, MD [phone]

## 2011-12-08 NOTE — Progress Notes (Signed)
ANTICOAGULATION/ANTIBIOTIC CONSULT NOTE - Initial Consult  Pharmacy Consult for Coumadin and Cipro Indication: atrial fibrillation and possible colitis  Allergies  Allergen Reactions  . Zolpidem Tartrate     REACTION: bad hangover    Patient Measurements: Height: 5\' 6"  (167.6 cm) Weight: 214 lb 1.6 oz (97.115 kg) IBW/kg (Calculated) : 63.8   Vital Signs: Temp: 98 F (36.7 C) (10/14 0120) Temp src: Oral (10/14 0120) BP: 128/71 mmHg (10/14 0120) Pulse Rate: 87  (10/14 0120)  Labs:  Basename 12/07/11 1841 12/07/11 1831 12/05/11 1309  HGB -- 10.7* --  HCT -- 32.6* --  PLT -- 401* --  APTT 41* -- --  LABPROT 26.8* -- --  INR 2.63* -- 2.6  HEPARINUNFRC -- -- --  CREATININE 1.52* -- --  CKTOTAL -- -- --  CKMB -- -- --  TROPONINI -- -- --    Estimated Creatinine Clearance: 59.2 ml/min (by C-G formula based on Cr of 1.52).   Medical History: Past Medical History  Diagnosis Date  . HYPOTHYROIDISM, ACQUIRED NEC 09/28/2006  . HYPERLIPIDEMIA 09/28/2006  . ANXIETY 09/28/2006  . OBSTRUCTIVE SLEEP APNEA 01/06/2008  . HYPERTENSION 09/28/2006  . Atrial fibrillation 09/28/2006    s/p afib ablation x 2  . Diastolic dysfunction 09/28/2006  . ALLERGIC RHINITIS 01/14/2007  . CHRONIC OBSTRUCTIVE PULMONARY DISEASE, ACUTE EXACERBATION 10/04/2008  . Long term current use of anticoagulant 04/08/2010  . Right knee DJD 07/09/2010  . Asbestos exposure   . Coronary artery disease 11/24/2011  . S/P CABG x 1 11/26/2011    Right internal mammary artery to right coronary artery  . S/P Maze operation for atrial fibrillation 11/26/2011    complete biatrial lesion set with clipping of LA appendage    Medications:  Prescriptions prior to admission  Medication Sig Dispense Refill  . ALPRAZolam (XANAX) 0.5 MG tablet Take 1 tablet (0.5 mg total) by mouth 2 (two) times daily as needed. For anxiety  60 tablet  2  . amiodarone (PACERONE) 100 MG tablet Take 1 tablet (100 mg total) by mouth daily.  30 tablet  1    . amLODipine (NORVASC) 5 MG tablet Take 5 mg by mouth daily.      Marland Kitchen aspirin EC 81 MG EC tablet Take 1 tablet (81 mg total) by mouth daily.      . budesonide-formoterol (SYMBICORT) 160-4.5 MCG/ACT inhaler Inhale 2 puffs into the lungs 2 (two) times daily.      . citalopram (CELEXA) 20 MG tablet Take 20 mg by mouth daily.      . enalapril (VASOTEC) 10 MG tablet Take 10 mg by mouth daily.      . furosemide (LASIX) 40 MG tablet Take 1 tablet (40 mg total) by mouth daily. For 7 days then stop.  7 tablet  0  . iron polysaccharides (NIFEREX) 150 MG capsule Take 1 capsule (150 mg total) by mouth daily. For one month then stop.  30 capsule  0  . lovastatin (MEVACOR) 40 MG tablet Take 80 mg by mouth daily. Take two tablets by mouth once daily      . oxyCODONE (OXY IR/ROXICODONE) 5 MG immediate release tablet Take 1-2 tablets (5-10 mg total) by mouth every 4 (four) hours as needed for pain.  30 tablet  0  . potassium chloride SA (K-DUR,KLOR-CON) 20 MEQ tablet Take 1 tablet (20 mEq total) by mouth daily. For 7 days then stop.  7 tablet  0  . temazepam (RESTORIL) 30 MG capsule Take 1 capsule (30  mg total) by mouth at bedtime as needed. For sleep  30 capsule  2  . warfarin (COUMADIN) 2.5 MG tablet Take 2.5-3.75 mg by mouth daily. Take 3.75mg  (1&1/2 tablet) on Sun. Take 2.5mg  (1 tablet) all other days)      . hydrochlorothiazide (HYDRODIURIL) 25 MG tablet Take 1 tablet (25 mg total) by mouth daily. DO NOT START until 12/11/2011       Scheduled:    . amiodarone  100 mg Oral Daily  . amLODipine  5 mg Oral Daily  . aspirin EC  81 mg Oral Daily  . budesonide-formoterol  2 puff Inhalation BID  . ciprofloxacin  400 mg Intravenous NOW  . ciprofloxacin  400 mg Intravenous Q12H  . citalopram  20 mg Oral Daily  . enalapril  10 mg Oral Daily  . iron polysaccharides  150 mg Oral Daily  . metronidazole  500 mg Intravenous Q8H  .  morphine injection  4 mg Intravenous Once  .  morphine injection  4 mg Intravenous  Once  . ondansetron (ZOFRAN) IV  4 mg Intravenous Once  . ondansetron (ZOFRAN) IV  4 mg Intravenous Once  . simvastatin  20 mg Oral q1800  . sodium chloride  1,000 mL Intravenous Once  . sodium chloride  1,000 mL Intravenous Once  . sodium chloride  3 mL Intravenous Q12H  . warfarin  2.5 mg Oral Custom  . warfarin  3.75 mg Oral Q Sun-1800  . Warfarin - Pharmacist Dosing Inpatient   Does not apply q1800    Assessment: 56yo male s/p CABG last week c/o sudden dull LLQ pain associated with N/V, CT of abdomen negative for acute issues, to begin Cipro for possible colitis; also to continue Coumadin for Afib, admitted with therapeutic INR.  Goal of Therapy:  INR 2-3   Plan:  Will begin Cipro 400mg  IV Q12H; will continue home Coumadin dose of 2.5mg  daily except 3.75 on Sunday and monitor INR.  Colleen Can PharmD BCPS 12/08/2011,2:38 AM

## 2011-12-08 NOTE — H&P (Signed)
Scott [name] is an 56 y.o. male.   Patient seen and examined on December 08 2011. PCP - Dr. Oliver Barre. Cardiothoracic surgeon - Dr. Barry Dienes. Chief Complaint: Abdominal pain. HPI: 56 year-old male with history of CAD status post CABG last week, atrial fibrillation status post multiple RAF STATUS post maze procedure last week on Coumadin, COPD, OSA, tobacco abuse presented to the ER because of sudden onset of left lower quadrant pain since last evening 5 PM. The pain is dull aching constant associated with one episode of nausea and vomiting denies any diarrhea. Since the pain was persistent patient came to the ER. Blood work show significant leukocytosis. On-call cardiac surgery was consulted by the ER physician and they have requested CT abdomen pelvis. CT abdomen and pelvis was unremarkable. Since patient's pain is persistent patient has been admitted for further management. Patient denies any chest pain or shortness of breath at this time. He does get short of breath and exertion but presently is not in distress.  Past Medical History  Diagnosis Date  . HYPOTHYROIDISM, ACQUIRED NEC 09/28/2006  . HYPERLIPIDEMIA 09/28/2006  . ANXIETY 09/28/2006  . OBSTRUCTIVE SLEEP APNEA 01/06/2008  . HYPERTENSION 09/28/2006  . Atrial fibrillation 09/28/2006    s/p afib ablation x 2  . Diastolic dysfunction 09/28/2006  . ALLERGIC RHINITIS 01/14/2007  . CHRONIC OBSTRUCTIVE PULMONARY DISEASE, ACUTE EXACERBATION 10/04/2008  . Long term current use of anticoagulant 04/08/2010  . Right knee DJD 07/09/2010  . Asbestos exposure   . Coronary artery disease 11/24/2011  . S/P CABG x 1 11/26/2011    Right internal mammary artery to right coronary artery  . S/P Maze operation for atrial fibrillation 11/26/2011    complete biatrial lesion set with clipping of LA appendage    Past Surgical History  Procedure Date  . Hand surgury     left  . Radiofrequency ablation for atrial flutter 2005    CTI  . Radiofrequency ablation for  atrial fibrillation 12/30/07 and 06/05/09    afib ablation x 2 by JA  . Maze 11/26/2011    Procedure: MAZE;  Surgeon: Purcell Nails, MD;  Location: [facility] OR;  Service: Open Heart Surgery;  Laterality: N/A;  Cryomaze   . Coronary artery bypass graft 11/26/2011    Procedure: CORONARY ARTERY BYPASS GRAFTING (CABG);  Surgeon: Purcell Nails, MD;  Location: [facility] OR;  Service: Open Heart Surgery;  Laterality: N/A;  coronary artery bypass on pump times one utilizing the right internal mammary artery, transesophageal echocardiogram     Family History  Problem Relation Age of Onset  . Dementia Father   . Hypertension Father   . Atrial fibrillation Mother   . Stroke Mother    Social History:  reports that he quit smoking about 16 years ago. His smoking use included Cigarettes. He has a 39 pack-year smoking history. He does not have any smokeless tobacco history on file. He reports that he does not drink alcohol or use illicit drugs.  Allergies:  Allergies  Allergen Reactions  . Zolpidem Tartrate     REACTION: bad hangover    Medications Prior to Admission  Medication Sig Dispense Refill  . ALPRAZolam (XANAX) 0.5 MG tablet Take 1 tablet (0.5 mg total) by mouth 2 (two) times daily as needed. For anxiety  60 tablet  2  . amiodarone (PACERONE) 100 MG tablet Take 1 tablet (100 mg total) by mouth daily.  30 tablet  1  . amLODipine (NORVASC) 5 MG tablet Take 5  mg by mouth daily.      Marland Kitchen aspirin EC 81 MG EC tablet Take 1 tablet (81 mg total) by mouth daily.      . budesonide-formoterol (SYMBICORT) 160-4.5 MCG/ACT inhaler Inhale 2 puffs into the lungs 2 (two) times daily.      . citalopram (CELEXA) 20 MG tablet Take 20 mg by mouth daily.      . enalapril (VASOTEC) 10 MG tablet Take 10 mg by mouth daily.      . furosemide (LASIX) 40 MG tablet Take 1 tablet (40 mg total) by mouth daily. For 7 days then stop.  7 tablet  0  . iron polysaccharides (NIFEREX) 150 MG capsule Take 1 capsule (150 mg total) by mouth  daily. For one month then stop.  30 capsule  0  . lovastatin (MEVACOR) 40 MG tablet Take 80 mg by mouth daily. Take two tablets by mouth once daily      . oxyCODONE (OXY IR/ROXICODONE) 5 MG immediate release tablet Take 1-2 tablets (5-10 mg total) by mouth every 4 (four) hours as needed for pain.  30 tablet  0  . potassium chloride SA (K-DUR,KLOR-CON) 20 MEQ tablet Take 1 tablet (20 mEq total) by mouth daily. For 7 days then stop.  7 tablet  0  . temazepam (RESTORIL) 30 MG capsule Take 1 capsule (30 mg total) by mouth at bedtime as needed. For sleep  30 capsule  2  . warfarin (COUMADIN) 2.5 MG tablet Take 2.5-3.75 mg by mouth daily. Take 3.75mg  (1&1/2 tablet) on Sun. Take 2.5mg  (1 tablet) all other days)      . hydrochlorothiazide (HYDRODIURIL) 25 MG tablet Take 1 tablet (25 mg total) by mouth daily. DO NOT START until 12/11/2011        Results for orders placed during the hospital encounter of 12/07/11 (from the past 48 hour(s))  CBC     Status: Abnormal   Collection Time   12/07/11  6:31 PM      Component Value Range Comment   WBC 16.5 (*) 4.0 - 10.5 K/uL    RBC 3.48 (*) 4.22 - 5.81 MIL/uL    Hemoglobin 10.7 (*) 13.0 - 17.0 g/dL    HCT 16.1 (*) 09.6 - 52.0 %    MCV 93.7  78.0 - 100.0 fL    MCH 30.7  26.0 - 34.0 pg    MCHC 32.8  30.0 - 36.0 g/dL    RDW 04.5  40.9 - 81.1 %    Platelets 401 (*) 150 - 400 K/uL   POCT I-STAT TROPONIN I     Status: Abnormal   Collection Time   12/07/11  6:39 PM      Component Value Range Comment   Troponin i, poc 0.37 (*) 0.00 - 0.08 ng/mL    Comment NOTIFIED PHYSICIAN      Comment 3            APTT     Status: Abnormal   Collection Time   12/07/11  6:41 PM      Component Value Range Comment   aPTT 41 (*) 24 - 37 seconds   PROTIME-INR     Status: Abnormal   Collection Time   12/07/11  6:41 PM      Component Value Range Comment   Prothrombin Time 26.8 (*) 11.6 - 15.2 seconds    INR 2.63 (*) 0.00 - 1.49   COMPREHENSIVE METABOLIC PANEL     Status:  Abnormal   Collection Time  12/07/11  6:41 PM      Component Value Range Comment   Sodium 137  135 - 145 mEq/L    Potassium 4.1  3.5 - 5.1 mEq/L    Chloride 99  96 - 112 mEq/L    CO2 22  19 - 32 mEq/L    Glucose, Bld 121 (*) 70 - 99 mg/dL    BUN 24 (*) 6 - 23 mg/dL    Creatinine, Ser 4.09 (*) 0.50 - 1.35 mg/dL    Calcium 9.6  8.4 - 81.1 mg/dL    Total Protein 7.5  6.0 - 8.3 g/dL    Albumin 3.4 (*) 3.5 - 5.2 g/dL    AST 24  0 - 37 U/L    ALT 19  0 - 53 U/L    Alkaline Phosphatase 86  39 - 117 U/L    Total Bilirubin 0.7  0.3 - 1.2 mg/dL    GFR calc non Af Amer 50 (*) >90 mL/min    GFR calc Af Amer 57 (*) >90 mL/min   LIPASE, BLOOD     Status: Normal   Collection Time   12/07/11  6:41 PM      Component Value Range Comment   Lipase 52  11 - 59 U/L   SAMPLE TO BLOOD BANK     Status: Normal   Collection Time   12/07/11  7:00 PM      Component Value Range Comment   Blood Bank Specimen SAMPLE AVAILABLE FOR TESTING      Sample Expiration 12/08/2011     OCCULT BLOOD, POC DEVICE     Status: Normal   Collection Time   12/07/11  7:12 PM      Component Value Range Comment   Fecal Occult Bld NEGATIVE     URINALYSIS, ROUTINE W REFLEX MICROSCOPIC     Status: Abnormal   Collection Time   12/07/11  7:44 PM      Component Value Range Comment   Color, Urine AMBER (*) YELLOW BIOCHEMICALS MAY BE AFFECTED BY COLOR   APPearance CLOUDY (*) CLEAR    Specific Gravity, Urine 1.024  1.005 - 1.030    pH 5.5  5.0 - 8.0    Glucose, UA NEGATIVE  NEGATIVE mg/dL    Hgb urine dipstick NEGATIVE  NEGATIVE    Bilirubin Urine SMALL (*) NEGATIVE    Ketones, ur 15 (*) NEGATIVE mg/dL    Protein, ur NEGATIVE  NEGATIVE mg/dL    Urobilinogen, UA 1.0  0.0 - 1.0 mg/dL    Nitrite NEGATIVE  NEGATIVE    Leukocytes, UA NEGATIVE  NEGATIVE MICROSCOPIC NOT DONE ON URINES WITH NEGATIVE PROTEIN, BLOOD, LEUKOCYTES, NITRITE, OR GLUCOSE <1000 mg/dL.   Ct Abdomen Pelvis Wo Contrast  12/08/2011  *RADIOLOGY REPORT*   Clinical Data: Chest and abdominal pain.  Nausea and vomiting. Symptoms for 20 minutes.  CT ABDOMEN AND PELVIS WITHOUT CONTRAST  Technique:  Multidetector CT imaging of the abdomen and pelvis was performed following the standard protocol without intravenous contrast.  Comparison: 01/05/2005  Findings: Bilateral pleural effusions, greater on the right. Atelectasis in the lung bases.  Postoperative changes in the mediastinum.  The unenhanced appearance of the liver, spleen, pancreas, adrenal glands, kidneys, and retroperitoneal lymph nodes is unremarkable. Increased density in the gallbladder may suggest sludge. Calcification of the abdominal aorta without aneurysm.  The stomach is filled with contrast material.  No gastric wall thickening.  The small bowel are decompressed.  Stool filled colon without distension.  No free air or free fluid in the abdomen.  Pelvis:  The prostate gland is not enlarged.  The bladder wall is not thickened.  No free or loculated pelvic fluid collections. Rectosigmoid colon is decompressed and difficult to evaluate for wall thickness.  No evidence of diverticulitis.  No significant pelvic lymphadenopathy.  The appendix is not identified.  Mild degenerative changes in the lumbar spine and hips.  IMPRESSION: Increased density in the gallbladder may suggest sludge.  No stones or ductal dilatation identified.  Small bilateral pleural effusions and basilar atelectasis, greater on the right.  Postoperative changes in the mediastinum.   Original Report Authenticated By: Marlon Pel, M.D.    Dg Chest Portable 1 View  12/07/2011  *RADIOLOGY REPORT*  Clinical Data: Nausea.  Emesis.  Chest and abdominal pain.  Rule out free intraperitoneal air.  Recent CABG.  PORTABLE CHEST - 1 VIEW  Comparison: 12/03/2011  Findings: Prior median sternotomy. Numerous leads and wires project over the chest.  Midline trachea.  Borderline cardiomegaly. Decreased small right pleural effusion.  Decreased or  resolved left pleural fluid. No pneumothorax.  Mild pulmonary venous congestion which is not significantly changed.  Thickening of right minor fissure.  Mild subsegmental atelectasis at the bases. No free intraperitoneal air.  IMPRESSION:  1. No free intraperitoneal air. 2.  Overall improved aeration with decreased right and decreased to resolved left pleural fluid. 3.  Cardiomegaly with similar mild pulmonary venous congestion.   Original Report Authenticated By: Consuello Bossier, M.D.     Review of Systems  Constitutional: Negative.   HENT: Negative.   Eyes: Negative.   Respiratory: Negative.   Cardiovascular: Negative.   Gastrointestinal: Positive for nausea, vomiting and abdominal pain.  Genitourinary: Negative.   Musculoskeletal: Negative.   Skin: Negative.   Neurological: Negative.   Endo/Heme/Allergies: Negative.   Psychiatric/Behavioral: Negative.     Blood pressure 128/71, pulse 87, temperature 98 F (36.7 C), temperature source Oral, resp. rate 18, SpO2 100.00%. Physical Exam  Constitutional: He is oriented to person, place, and time. He appears well-developed and well-nourished. No distress.  HENT:  Head: Normocephalic and atraumatic.  Right Ear: External ear normal.  Left Ear: External ear normal.  Nose: Nose normal.  Mouth/Throat: Oropharynx is clear and moist. No oropharyngeal exudate.  Eyes: Conjunctivae normal are normal. Pupils are equal, round, and reactive to light. Right eye exhibits no discharge. Left eye exhibits no discharge. No scleral icterus.  Neck: Normal range of motion. Neck supple.  Cardiovascular: Normal rate and regular rhythm.   Respiratory: Effort normal and breath sounds normal. No respiratory distress. He has no wheezes. He has no rales.  GI: Soft. Bowel sounds are normal. He exhibits no distension. There is tenderness (Left lower quadrant.). There is no rebound and no guarding.  Musculoskeletal: Normal range of motion. He exhibits no edema and no  tenderness.  Neurological: He is alert and oriented to person, place, and time.       Moves all extremities.  Skin: Skin is warm and dry. He is not diaphoretic.     Assessment/Plan #1. Abdominal pain, left lower quadrant - patient does have tenderness on exam on the left lower quadrant. Due to persistent abdominal pain and tenderness and leukocytosis I have placed patient on ciprofloxacin and Flagyl. Patient did have a normal bowel movement in the morning. If there is loose stools then we have to check for C. difficile. Patient did receive 2 L normal saline in the ER for now  we will keep patient KVO. Closely observe clinically. #2. Acute renal failure - probably from dehydration. Patient did receive 2 L normal saline bolus. At this time we will keep patient on KVO. UA is unremarkable. We'll hold off his diuretics for now. If creatinine worsens then we may have to hold ACE inhibitors. #3. CAD status post CABG and Maze procedure last week - presently denies any chest pain or shortness of breath. #4. Atrial fibrillation rate controlled - Coumadin per pharmacy. #5. COPD - presently not wheezing. Continue inhalers. #6. Hyperlipidemia - continue statins. #7. Anemia probably from blood loss from recent surgery - continue iron replacement and follow CBC.  CODE STATUS - full code.    KAKRAKANDY,ARSHAD N. 12/08/2011, 1:22 AM

## 2011-12-12 ENCOUNTER — Ambulatory Visit (INDEPENDENT_AMBULATORY_CARE_PROVIDER_SITE_OTHER): Payer: Self-pay | Admitting: Internal Medicine

## 2011-12-12 ENCOUNTER — Encounter: Payer: Self-pay | Admitting: Internal Medicine

## 2011-12-12 ENCOUNTER — Ambulatory Visit (INDEPENDENT_AMBULATORY_CARE_PROVIDER_SITE_OTHER): Payer: Self-pay

## 2011-12-12 VITALS — BP 111/78 | HR 93 | Ht 66.0 in | Wt 192.4 lb

## 2011-12-12 DIAGNOSIS — I4892 Unspecified atrial flutter: Secondary | ICD-10-CM

## 2011-12-12 DIAGNOSIS — Z7901 Long term (current) use of anticoagulants: Secondary | ICD-10-CM

## 2011-12-12 DIAGNOSIS — I4891 Unspecified atrial fibrillation: Secondary | ICD-10-CM

## 2011-12-12 DIAGNOSIS — I2510 Atherosclerotic heart disease of native coronary artery without angina pectoris: Secondary | ICD-10-CM

## 2011-12-12 DIAGNOSIS — I495 Sick sinus syndrome: Secondary | ICD-10-CM

## 2011-12-12 LAB — POCT INR: INR: 4

## 2011-12-12 NOTE — Progress Notes (Signed)
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 male who presents today for routine followup s/p MAZE.  He is making slow but steady progress.  His has typical chest wall pain.  His SOB continues to improve.   Since last being seen in our clinic, the patient reports doing very well.  Today, he denies symptoms of palpitations, chest pain, shortness of breath,  lower extremity edema, dizziness, presyncope, or syncope.  The patient is otherwise without complaint today.   Past Medical History  Diagnosis Date  . HYPOTHYROIDISM, ACQUIRED NEC 09/28/2006  . HYPERLIPIDEMIA 09/28/2006  . ANXIETY 09/28/2006  . OBSTRUCTIVE SLEEP APNEA 01/06/2008  . HYPERTENSION 09/28/2006  . Atrial fibrillation 09/28/2006    s/p afib ablation x 2  . Diastolic dysfunction 09/28/2006  . ALLERGIC RHINITIS 01/14/2007  . CHRONIC OBSTRUCTIVE PULMONARY DISEASE, ACUTE EXACERBATION 10/04/2008  . Long term current use of anticoagulant 04/08/2010  . Right knee DJD 07/09/2010  . Asbestos exposure   . Coronary artery disease 11/24/2011  . S/P CABG x 1 11/26/2011    Right internal mammary artery to right coronary artery  . S/P Maze operation for atrial fibrillation 11/26/2011    complete biatrial lesion set with clipping of LA appendage   Past Surgical History  Procedure Date  . Hand surgury     left  . Radiofrequency ablation for atrial flutter 2005    CTI  . Radiofrequency ablation for atrial fibrillation 12/30/07 and 06/05/09    afib ablation x 2 by JA  . Maze 11/26/2011    Procedure: MAZE;  Surgeon: Purcell Nails, MD;  Location: [facility] OR;  Service: Open Heart Surgery;  Laterality: N/A;  Cryomaze   . Coronary artery bypass graft 11/26/2011    Procedure: CORONARY ARTERY BYPASS GRAFTING (CABG);  Surgeon: Purcell Nails, MD;  Location: [facility] OR;  Service: Open Heart Surgery;  Laterality: N/A;  coronary artery bypass on pump times one utilizing the right internal mammary artery, transesophageal echocardiogram      Current Outpatient Prescriptions  Medication Sig Dispense Refill  . ALPRAZolam (XANAX) 0.5 MG tablet Take 1 tablet (0.5 mg total) by mouth 2 (two) times daily as needed. For anxiety  60 tablet  2  . amiodarone (PACERONE) 100 MG tablet Take 1 tablet (100 mg total) by mouth daily.  30 tablet  1  . amLODipine (NORVASC) 5 MG tablet Take 5 mg by mouth daily.      Marland Kitchen aspirin EC 81 MG EC tablet Take 1 tablet (81 mg total) by mouth daily.      . budesonide-formoterol (SYMBICORT) 160-4.5 MCG/ACT inhaler Inhale 2 puffs into the lungs 2 (two) times daily.      . citalopram (CELEXA) 20 MG tablet Take 20 mg by mouth daily.      . enalapril (VASOTEC) 10 MG tablet Take 10 mg by mouth daily.      . hydrochlorothiazide (HYDRODIURIL) 25 MG tablet Take 1 tablet (25 mg total) by mouth daily. DO NOT START until 12/11/2011      . iron polysaccharides (NIFEREX) 150 MG capsule Take 1 capsule (150 mg total) by mouth daily. For one month then stop.  30 capsule  0  . lovastatin (MEVACOR) 40 MG tablet Take 80 mg by mouth daily. Take two tablets by mouth once daily      . oxyCODONE (OXY IR/ROXICODONE) 5 MG immediate release tablet Take 1-2 tablets (5-10 mg total) by mouth every 4 (four) hours  as needed for pain.  30 tablet  0  . potassium chloride SA (K-DUR,KLOR-CON) 20 MEQ tablet Take 1 tablet (20 mEq total) by mouth daily. For 7 days then stop.  7 tablet  0  . temazepam (RESTORIL) 30 MG capsule Take 1 capsule (30 mg total) by mouth at bedtime as needed. For sleep  30 capsule  2  . warfarin (COUMADIN) 2.5 MG tablet Take 2.5-3.75 mg by mouth daily. Take 3.75mg  (1&1/2 tablet) on Sun. Take 2.5mg  (1 tablet) all other days)        Physical Exam: Filed Vitals:   12/12/11 1237  BP: 111/78  Pulse: 93  Height: 5\' 6"  (1.676 m)  Weight: 192 lb 6.4 oz (87.272 kg)    GEN- The patient is well appearing, alert and oriented x 3 today.   Head- normocephalic, atraumatic Eyes-  Sclera clear, conjunctiva pink Ears- hearing  intact Oropharynx- clear Lungs- Clear to ausculation bilaterally, normal work of breathing Heart- Regular rate and rhythm, no murmurs, rubs or gallops, PMI not laterally displaced GI- soft, NT, ND, + BS Extremities- no clubbing, cyanosis, or edema  ekg today reveals sinus rhythm 93 bpm, nonspecific ST/ T changes  Assessment and Plan:

## 2011-12-14 NOTE — Assessment & Plan Note (Signed)
S/p single vessel cabg No ischemic symptoms  No changes today

## 2011-12-14 NOTE — Assessment & Plan Note (Signed)
resolved 

## 2011-12-14 NOTE — Assessment & Plan Note (Signed)
Maintaining sinus rhythm Dr Cornelius Moras to determine when we stop amiodarone I think that it would be reasonable to continue amiodarone for several months.

## 2011-12-15 ENCOUNTER — Telehealth: Payer: Self-pay

## 2011-12-15 DIAGNOSIS — G8918 Other acute postprocedural pain: Secondary | ICD-10-CM

## 2011-12-15 MED ORDER — OXYCODONE HCL 5 MG PO TABS: 5.0000 mg | ORAL_TABLET | Freq: Four times a day (QID) | ORAL | Status: DC | PRN

## 2011-12-15 NOTE — Telephone Encounter (Signed)
Rx for Oxycodone 5 mg #40/ no refill printed out and Dr Cornelius Moras signed. Pt will pick up at front desk.

## 2011-12-18 ENCOUNTER — Other Ambulatory Visit: Payer: Self-pay | Admitting: Thoracic Surgery (Cardiothoracic Vascular Surgery)

## 2011-12-18 DIAGNOSIS — Z951 Presence of aortocoronary bypass graft: Secondary | ICD-10-CM

## 2011-12-19 ENCOUNTER — Encounter: Payer: Self-pay | Admitting: Internal Medicine

## 2011-12-19 ENCOUNTER — Ambulatory Visit (INDEPENDENT_AMBULATORY_CARE_PROVIDER_SITE_OTHER): Payer: Self-pay | Admitting: Internal Medicine

## 2011-12-19 VITALS — BP 102/68 | HR 98 | Temp 97.0°F | Ht 66.0 in | Wt 187.4 lb

## 2011-12-19 DIAGNOSIS — Z0000 Encounter for general adult medical examination without abnormal findings: Secondary | ICD-10-CM

## 2011-12-19 NOTE — Patient Instructions (Addendum)
Continue all other medications as before Please have the pharmacy call with any refills you may need. Please keep your appointments with your specialists as you have planned No changes today in treatment You are otherwise up to date, but you will likely want to hold on the colonscopy until 2014 when it may be free with the Obamacare plans, and you may be off the coumadin Please go to LAB in the Basement for the blood and/or urine tests to be done at your convenience (just the PSA) Thank you for enrolling in MyChart. Please follow the instructions below to securely access your online medical record. MyChart allows you to send messages to your doctor, view your test results, renew your prescriptions, schedule appointments, and more. Please return in 6 months, or sooner if needed

## 2011-12-20 ENCOUNTER — Encounter: Payer: Self-pay | Admitting: Internal Medicine

## 2011-12-20 NOTE — Assessment & Plan Note (Signed)
Overall doing well, age appropriate education and counseling updated, referrals for preventative services and immunizations addressed, dietary and smoking counseling addressed, most recent labs and ECG reviewed.  I have personally reviewed and have noted: 1) the patient's medical and social history 2) The pt's use of alcohol, tobacco, and illicit drugs 3) The patient's current medications and supplements 4) Functional ability including ADL's, fall risk, home safety risk, hearing and visual impairment 5) Diet and physical activities 6) Evidence for depression or mood disorder 7) The patient's height, weight, and BMI have been recorded in the chart I have made referrals, and provided counseling and education based on review of the above Has had mult lab reviewed with pt today, needs PSA only

## 2011-12-20 NOTE — Progress Notes (Signed)
Subjective:    Patient ID: Scott [name], male    DOB: 1956-01-12, 56 y.o.   MRN: [id number]  HPI  Here for welness and f/u;  Overall doing ok;  Pt denies new CP, worsening SOB, DOE, wheezing, orthopnea, PND, worsening LE edema, palpitations, dizziness or syncope.  Pt denies neurological change such as new Headache, facial or extremity weakness.  Pt denies polydipsia, polyuria, or low sugar symptoms. Pt states overall good compliance with treatment and medications, good tolerability, and trying to follow lower cholesterol diet.  Pt denies worsening depressive symptoms, suicidal ideation or panic. No fever, wt loss, night sweats, loss of appetite, or other constitutional symptoms.  Pt states good ability with ADL's, low fall risk, home safety reviewed and adequate, no significant changes in hearing or vision, and doing overall ok 3 wks post op with postop chest soreness.  No new complaints Past Medical History  Diagnosis Date  . HYPOTHYROIDISM, ACQUIRED NEC 09/28/2006  . HYPERLIPIDEMIA 09/28/2006  . ANXIETY 09/28/2006  . OBSTRUCTIVE SLEEP APNEA 01/06/2008  . HYPERTENSION 09/28/2006  . Atrial fibrillation 09/28/2006    s/p afib ablation x 2  . Diastolic dysfunction 09/28/2006  . ALLERGIC RHINITIS 01/14/2007  . CHRONIC OBSTRUCTIVE PULMONARY DISEASE, ACUTE EXACERBATION 10/04/2008  . Long term current use of anticoagulant 04/08/2010  . Right knee DJD 07/09/2010  . Asbestos exposure   . Coronary artery disease 11/24/2011  . S/P CABG x 1 11/26/2011    Right internal mammary artery to right coronary artery  . S/P Maze operation for atrial fibrillation 11/26/2011    complete biatrial lesion set with clipping of LA appendage   Past Surgical History  Procedure Date  . Hand surgury     left  . Radiofrequency ablation for atrial flutter 2005    CTI  . Radiofrequency ablation for atrial fibrillation 12/30/07 and 06/05/09    afib ablation x 2 by JA  . Maze 11/26/2011    Procedure: MAZE;  Surgeon: Purcell Nails,  MD;  Location: [facility] OR;  Service: Open Heart Surgery;  Laterality: N/A;  Cryomaze   . Coronary artery bypass graft 11/26/2011    Procedure: CORONARY ARTERY BYPASS GRAFTING (CABG);  Surgeon: Purcell Nails, MD;  Location: [facility] OR;  Service: Open Heart Surgery;  Laterality: N/A;  coronary artery bypass on pump times one utilizing the right internal mammary artery, transesophageal echocardiogram     reports that he quit smoking about 16 years ago. His smoking use included Cigarettes. He has a 39 pack-year smoking history. He does not have any smokeless tobacco history on file. He reports that he does not drink alcohol or use illicit drugs. family history includes Atrial fibrillation in his mother; Dementia in his father; Hypertension in his father; and Stroke in his mother. Allergies  Allergen Reactions  . Zolpidem Tartrate     REACTION: bad hangover   Current Outpatient Prescriptions on File Prior to Visit  Medication Sig Dispense Refill  . ALPRAZolam (XANAX) 0.5 MG tablet Take 1 tablet (0.5 mg total) by mouth 2 (two) times daily as needed. For anxiety  60 tablet  2  . amiodarone (PACERONE) 100 MG tablet Take 1 tablet (100 mg total) by mouth daily.  30 tablet  1  . amLODipine (NORVASC) 5 MG tablet Take 5 mg by mouth daily.      Marland Kitchen aspirin EC 81 MG EC tablet Take 1 tablet (81 mg total) by mouth daily.      . budesonide-formoterol (SYMBICORT) 160-4.5 MCG/ACT inhaler  Inhale 2 puffs into the lungs 2 (two) times daily.      . citalopram (CELEXA) 20 MG tablet Take 20 mg by mouth daily.      . enalapril (VASOTEC) 10 MG tablet Take 10 mg by mouth daily.      . hydrochlorothiazide (HYDRODIURIL) 25 MG tablet Take 1 tablet (25 mg total) by mouth daily. DO NOT START until 12/11/2011      . iron polysaccharides (NIFEREX) 150 MG capsule Take 1 capsule (150 mg total) by mouth daily. For one month then stop.  30 capsule  0  . lovastatin (MEVACOR) 40 MG tablet Take 80 mg by mouth daily. Take two tablets by mouth once  daily      . oxyCODONE (OXY IR/ROXICODONE) 5 MG immediate release tablet Take 1-2 tablets (5-10 mg total) by mouth every 6 (six) hours as needed for pain.  40 tablet  0  . temazepam (RESTORIL) 30 MG capsule Take 1 capsule (30 mg total) by mouth at bedtime as needed. For sleep  30 capsule  2  . warfarin (COUMADIN) 2.5 MG tablet Take 2.5-3.75 mg by mouth daily. Take 3.75mg  (1&1/2 tablet) on Sun. Take 2.5mg  (1 tablet) all other days)      . potassium chloride SA (K-DUR,KLOR-CON) 20 MEQ tablet Take 1 tablet (20 mEq total) by mouth daily. For 7 days then stop.  7 tablet  0   Review of Systems Review of Systems  Constitutional: Negative for diaphoresis, activity change, appetite change and unexpected weight change.  HENT: Negative for hearing loss, ear pain, facial swelling, mouth sores and neck stiffness.   Eyes: Negative for pain, redness and visual disturbance.  Respiratory: Negative for shortness of breath and wheezing.   Cardiovascular: Negative for chest pain and palpitations.  Gastrointestinal: Negative for diarrhea, blood in stool, abdominal distention and rectal pain.  Genitourinary: Negative for hematuria, flank pain and decreased urine volume.  Musculoskeletal: Negative for myalgias and joint swelling.  Skin: Negative for color change and wound.  Neurological: Negative for syncope and numbness.  Hematological: Negative for adenopathy.  Psychiatric/Behavioral: Negative for hallucinations, self-injury, decreased concentration and agitation.      Objective:   Physical Exam BP 102/68  Pulse 98  Temp 97 F (36.1 C) (Oral)  Ht 5\' 6"  (1.676 m)  Wt 187 lb 6 oz (84.993 kg)  BMI 30.24 kg/m2  SpO2 96% Physical Exam  VS noted Constitutional: Pt is oriented to person, place, and time. Appears well-developed and well-nourished.  HENT:  Head: Normocephalic and atraumatic.  Right Ear: External ear normal.  Left Ear: External ear normal.  Nose: Nose normal.  Mouth/Throat: Oropharynx is  clear and moist.  Eyes: Conjunctivae and EOM are normal. Pupils are equal, round, and reactive to light.  Neck: Normal range of motion. Neck supple. No JVD present. No tracheal deviation present.  Cardiovascular: Normal rate, regular rhythm, normal heart sounds and intact distal pulses.   Pulmonary/Chest: Effort normal and breath sounds normal.  Abdominal: Soft. Bowel sounds are normal. There is no tenderness.  Musculoskeletal: Normal range of motion. Exhibits no edema.  Lymphadenopathy:  Has no cervical adenopathy.  Neurological: Pt is alert and oriented to person, place, and time. Pt has normal reflexes. No cranial nerve deficit.  Skin: Skin is warm and dry. No rash noted. Has anterior chest wall soreness, but midline chest wound intact without erythema Psychiatric:  Has  normal mood and affect. Behavior is normal.     Assessment & Plan:

## 2011-12-22 ENCOUNTER — Ambulatory Visit (INDEPENDENT_AMBULATORY_CARE_PROVIDER_SITE_OTHER): Payer: Self-pay | Admitting: Thoracic Surgery (Cardiothoracic Vascular Surgery)

## 2011-12-22 ENCOUNTER — Ambulatory Visit
Admission: RE | Admit: 2011-12-22 | Discharge: 2011-12-22 | Disposition: A | Discharge status: Home / Self Care | Payer: No Typology Code available for payment source | Source: Ambulatory Visit | Attending: Thoracic Surgery (Cardiothoracic Vascular Surgery) | Admitting: Thoracic Surgery (Cardiothoracic Vascular Surgery)

## 2011-12-22 ENCOUNTER — Ambulatory Visit: Payer: Self-pay | Admitting: Internal Medicine

## 2011-12-22 ENCOUNTER — Encounter: Payer: Self-pay | Admitting: Thoracic Surgery (Cardiothoracic Vascular Surgery)

## 2011-12-22 VITALS — BP 108/70 | HR 96 | Resp 18 | Ht 66.0 in | Wt 195.0 lb

## 2011-12-22 DIAGNOSIS — I2510 Atherosclerotic heart disease of native coronary artery without angina pectoris: Secondary | ICD-10-CM

## 2011-12-22 DIAGNOSIS — Z9889 Other specified postprocedural states: Secondary | ICD-10-CM

## 2011-12-22 DIAGNOSIS — Z951 Presence of aortocoronary bypass graft: Secondary | ICD-10-CM

## 2011-12-22 NOTE — Patient Instructions (Addendum)
Stop Norvasc (amlodipine) and hydrochlorothiazide.  Continue to avoid any heavy lifting or strenuous use of arms or shoulders for at least a total of three months from the time of surgery.  You may return driving an automobile as long as you are no longer requiring oral narcotic pain relievers during the daytime.  I would advise to start driving short distances during the daylight and gradually increase from there as they feel comfortable.  Continue cardiac rehab.

## 2011-12-22 NOTE — Progress Notes (Signed)
[address]            [name] [address]          [phone]     CARDIOTHORACIC SURGERY OFFICE NOTE  Referring Provider is [name], MD PCP is Oliver Barre, MD   HPI:  Patient returns for followup status post Maze procedure with single-vessel coronary artery bypass grafting on 11/26/2011.  His postoperative recovery has been uncomplicated. Since hospital discharge he has continued to do well.  He still has mild soreness in his chest but this is gradually improving and he has not been taking any sort of narcotic pain relievers for the past 2 weeks. His exercise tolerance is slowly improving. His appetite is good and he is sleeping well at night. He is particularly pleased by the fact that he has not had any episodes of tachypalpitations consistent with recurrent paroxysmal atrial fibrillation as he had been suffering from prior to surgery.  He has been keeping careful track of his pulse and blood pressure. Resting pulse is been little bit higher than his usual baseline, and his blood pressure has been running somewhat on the low side, typically in the 90s systolic. He has not had any dizzy spells.   Current Outpatient Prescriptions  Medication Sig Dispense Refill  . ALPRAZolam (XANAX) 0.5 MG tablet Take 1 tablet (0.5 mg total) by mouth 2 (two) times daily as needed. For anxiety  60 tablet  2  . amiodarone (PACERONE) 100 MG tablet Take 1 tablet (100 mg total) by mouth daily.  30 tablet  1  . amLODipine (NORVASC) 5 MG tablet Take 5 mg by mouth daily.      Marland Kitchen aspirin EC 81 MG EC tablet Take 1 tablet (81 mg total) by mouth daily.      . budesonide-formoterol (SYMBICORT) 160-4.5 MCG/ACT inhaler Inhale 2 puffs into the lungs 2 (two) times daily.      . citalopram (CELEXA) 20 MG tablet Take 20 mg by mouth daily.      . enalapril (VASOTEC) 10 MG tablet Take 10 mg by mouth daily.      . hydrochlorothiazide (HYDRODIURIL) 25 MG tablet Take 1 tablet (25 mg total)  by mouth daily. DO NOT START until 12/11/2011      . iron polysaccharides (NIFEREX) 150 MG capsule Take 1 capsule (150 mg total) by mouth daily. For one month then stop.  30 capsule  0  . lovastatin (MEVACOR) 40 MG tablet Take 80 mg by mouth daily. Take two tablets by mouth once daily      . nadolol (CORGARD) 20 MG tablet Take 20 mg by mouth daily.      Marland Kitchen oxyCODONE (OXY IR/ROXICODONE) 5 MG immediate release tablet Take 1-2 tablets (5-10 mg total) by mouth every 6 (six) hours as needed for pain.  40 tablet  0  . temazepam (RESTORIL) 30 MG capsule Take 1 capsule (30 mg total) by mouth at bedtime as needed. For sleep  30 capsule  2  . warfarin (COUMADIN) 2.5 MG tablet Take 2.5-3.75 mg by mouth daily. Take 3.75mg  (1&1/2 tablet) on Sun. Take 2.5mg  (1 tablet) all other days)          Physical Exam:   BP 108/70  Pulse 96  Resp 18  Ht 5\' 6"  (1.676 m)  Wt 195 lb (88.451 kg)  BMI 31.47 kg/m2  SpO2 97%  General:  Well-appearing  Chest:   Clear to auscultation with symmetrical breath sounds  CV:   Regular rate and rhythm  Incisions:  Clean and dry and healing nicely  Abdomen:  Soft and nontender  Extremities:  Warm and well-perfused  Diagnostic Tests:  2 channel telemetry rhythm strip demonstrates sinus rhythm   *RADIOLOGY REPORT*  Clinical Data: Post CABG 1 month ago, chest soreness, shortness of  breath  CHEST - 2 VIEW  Comparison: Portable chest x-ray of 12/07/2011  Findings: The lungs appear better aerated. Only a small right  pleural effusion remains. Mild cardiomegaly stable and a left  atrial exclusion device is noted. Median sternotomy sutures are  present.  IMPRESSION:  Better aeration with no active infiltrate. A small right effusion  remains.  Original Report Authenticated By: Juline Patch, M.D.    Impression:  Patient is doing well following Maze procedure and single-vessel coronary artery bypass grafting. He is maintaining sinus rhythm and his exercise tolerance is  gradually improving nicely. His blood pressure is running somewhat on the low side.  Plan:  I've instructed the patient to stop taking amlodipine and hydrochlorothiazide but continue Vasotec and Corgard. He will also continue on amiodarone for the time being. I think he can resume driving an automobile and start gradually increasing his physical activity as tolerated. I've reminded him to refrain from any heavy lifting or strenuous use of his arms or shoulders for least another 2 months. He will keep in his blood pressure and resting pulse which he checks every morning. He is scheduled to see Dr. Johney Frame sometime next month. We will plan to see him back in 8 weeks for followup and rhythm check.   [name]. Cornelius Moras, MD 12/22/2011 4:36 PM

## 2011-12-26 ENCOUNTER — Ambulatory Visit (INDEPENDENT_AMBULATORY_CARE_PROVIDER_SITE_OTHER): Payer: Self-pay | Admitting: *Deleted

## 2011-12-26 DIAGNOSIS — I4891 Unspecified atrial fibrillation: Secondary | ICD-10-CM

## 2011-12-26 DIAGNOSIS — Z7901 Long term (current) use of anticoagulants: Secondary | ICD-10-CM

## 2011-12-26 DIAGNOSIS — I4892 Unspecified atrial flutter: Secondary | ICD-10-CM

## 2011-12-30 NOTE — Discharge Summary (Signed)
Physician Discharge Summary  Patient ID: Scott [name] MRN: [id number] DOB/AGE: 08-05-1955 56 y.o.  Admit date: 12/07/2011 Discharge date: 12/08/2011  Primary Care Physician:  Oliver Barre, MD   Discharge Diagnoses:     Abdominal pain: gastroenteritis vs indigestion resolved  HYPERTENSION  S/P CABG x 1  P.afib COPD OSA     Medication List     As of 12/30/2011  6:25 PM    STOP taking these medications         furosemide 40 MG tablet   Commonly known as: LASIX      TAKE these medications         ALPRAZolam 0.5 MG tablet   Commonly known as: XANAX   Take 1 tablet (0.5 mg total) by mouth 2 (two) times daily as needed. For anxiety      amiodarone 100 MG tablet   Commonly known as: PACERONE   Take 1 tablet (100 mg total) by mouth daily.      aspirin 81 MG EC tablet   Take 1 tablet (81 mg total) by mouth daily.      budesonide-formoterol 160-4.5 MCG/ACT inhaler   Commonly known as: SYMBICORT   Inhale 2 puffs into the lungs 2 (two) times daily.      citalopram 20 MG tablet   Commonly known as: CELEXA   Take 20 mg by mouth daily.      enalapril 10 MG tablet   Commonly known as: VASOTEC   Take 10 mg by mouth daily.      iron polysaccharides 150 MG capsule   Commonly known as: NIFEREX   Take 1 capsule (150 mg total) by mouth daily. For one month then stop.      lovastatin 40 MG tablet   Commonly known as: MEVACOR   Take 80 mg by mouth daily. Take two tablets by mouth once daily      temazepam 30 MG capsule   Commonly known as: RESTORIL   Take 1 capsule (30 mg total) by mouth at bedtime as needed. For sleep      warfarin 2.5 MG tablet   Commonly known as: COUMADIN   Take 2.5-3.75 mg by mouth daily. Take 3.75mg  (1&1/2 tablet) on Sun. Take 2.5mg  (1 tablet) all other days)         Disposition and Follow-up:  PCP in 1 week Dr.Owen in 2weeks  Consults: Dr.Owen , CVTS  Significant Diagnostic Studies:  CT Abd/Pelvis: .IMPRESSION: Increased density in  the gallbladder may suggest sludge. No stones or ductal dilatation identified. Small bilateral pleural effusions and basilar atelectasis, greater on the right. Postoperative changes in the mediastinum    Brief H and P: 56 year-old male with history of CAD status post CABG last week, atrial fibrillation status post multiple RAF STATUS post maze procedure last week on Coumadin, COPD, OSA, tobacco abuse presented to the ER because of sudden onset of left lower quadrant pain since last evening 5 PM. The pain is dull aching constant associated with one episode of nausea and vomiting denies any diarrhea. Since the pain was persistent patient came to the ER. Blood work show significant leukocytosis. On-call cardiac surgery was consulted by the ER physician and they have requested CT abdomen pelvis. CT abdomen and pelvis was unremarkable. Since patient's pain is persistent patient has been admitted for further management. Patient denies any chest pain or shortness of breath at this time    Hospital Course:  admitted with LUQ /diffuse abd pain yesterday evening, resolved  since last pm  Now asymptomatic , symptoms resolved CT abd pelvis unremarkable CABG/MAZE on 10/2 per Dr.Owen, notified  CVTS since recent CABG  ? Indigestion vs Mild gastroenteritis vs gastritis, CT non contrast unremarkable   observed, treated with supportive care only and tolerated diet and hence discharged home in stable condition    Time spent on Discharge:  Signed: Brittish Bolinger [facility]  12/30/2011, 6:25 PM

## 2012-01-06 ENCOUNTER — Other Ambulatory Visit: Payer: Self-pay

## 2012-01-06 MED ORDER — HYDROCODONE-ACETAMINOPHEN 5-325 MG PO TABS: 1.0000 | ORAL_TABLET | Freq: Four times a day (QID) | ORAL | Status: DC | PRN

## 2012-01-06 NOTE — Telephone Encounter (Signed)
Pharmacy requesting refill on hydrocodone 5/325

## 2012-01-06 NOTE — Telephone Encounter (Signed)
Faxed hardcopy to pharmacy. 

## 2012-01-06 NOTE — Telephone Encounter (Signed)
Done hardcopy to robin  

## 2012-01-07 ENCOUNTER — Encounter: Payer: Self-pay | Admitting: Internal Medicine

## 2012-01-07 ENCOUNTER — Other Ambulatory Visit (INDEPENDENT_AMBULATORY_CARE_PROVIDER_SITE_OTHER): Payer: Self-pay

## 2012-01-07 DIAGNOSIS — Z0000 Encounter for general adult medical examination without abnormal findings: Secondary | ICD-10-CM

## 2012-01-09 ENCOUNTER — Ambulatory Visit (INDEPENDENT_AMBULATORY_CARE_PROVIDER_SITE_OTHER): Payer: Self-pay | Admitting: *Deleted

## 2012-01-09 DIAGNOSIS — Z7901 Long term (current) use of anticoagulants: Secondary | ICD-10-CM

## 2012-01-09 DIAGNOSIS — I4891 Unspecified atrial fibrillation: Secondary | ICD-10-CM

## 2012-01-09 DIAGNOSIS — I4892 Unspecified atrial flutter: Secondary | ICD-10-CM

## 2012-01-09 LAB — POCT INR: INR: 4

## 2012-01-12 ENCOUNTER — Other Ambulatory Visit: Payer: Self-pay | Admitting: Internal Medicine

## 2012-01-12 MED ORDER — BUDESONIDE-FORMOTEROL FUMARATE 160-4.5 MCG/ACT IN AERO: 2.0000 | INHALATION_SPRAY | Freq: Two times a day (BID) | RESPIRATORY_TRACT | Status: DC

## 2012-01-15 ENCOUNTER — Other Ambulatory Visit: Payer: Self-pay

## 2012-01-15 MED ORDER — BUDESONIDE-FORMOTEROL FUMARATE 160-4.5 MCG/ACT IN AERO: 2.0000 | INHALATION_SPRAY | Freq: Two times a day (BID) | RESPIRATORY_TRACT | Status: DC

## 2012-01-15 NOTE — Telephone Encounter (Signed)
Printed symbicort rx for the patient to mail in to [facility].  Patient informed

## 2012-01-19 ENCOUNTER — Encounter: Payer: Self-pay | Admitting: Internal Medicine

## 2012-01-19 ENCOUNTER — Ambulatory Visit (INDEPENDENT_AMBULATORY_CARE_PROVIDER_SITE_OTHER): Payer: Self-pay

## 2012-01-19 ENCOUNTER — Ambulatory Visit (INDEPENDENT_AMBULATORY_CARE_PROVIDER_SITE_OTHER): Payer: Self-pay | Admitting: Internal Medicine

## 2012-01-19 VITALS — BP 128/70 | HR 98 | Ht 66.0 in | Wt 192.0 lb

## 2012-01-19 DIAGNOSIS — I4891 Unspecified atrial fibrillation: Secondary | ICD-10-CM

## 2012-01-19 DIAGNOSIS — R0602 Shortness of breath: Secondary | ICD-10-CM

## 2012-01-19 DIAGNOSIS — R042 Hemoptysis: Secondary | ICD-10-CM

## 2012-01-19 DIAGNOSIS — I4892 Unspecified atrial flutter: Secondary | ICD-10-CM

## 2012-01-19 DIAGNOSIS — Z7901 Long term (current) use of anticoagulants: Secondary | ICD-10-CM

## 2012-01-19 LAB — POCT INR: INR: 2.1

## 2012-01-19 NOTE — Progress Notes (Signed)
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 male who presents today for routine electrophysiology followup.  Since last being seen in our clinic, the patient reports doing well.  His SOB is stable.  He did have scant hemoptysis several weeks ago (though INR was 4 at that time).  He reports having about 20 minutes of afib at rest 2 weeks ago, but no other episodes.  Today, he denies symptoms of chest pain,  lower extremity edema, dizziness, presyncope, or syncope.  The patient is otherwise without complaint today.   Past Medical History  Diagnosis Date  . HYPOTHYROIDISM, ACQUIRED NEC 09/28/2006  . HYPERLIPIDEMIA 09/28/2006  . ANXIETY 09/28/2006  . OBSTRUCTIVE SLEEP APNEA 01/06/2008  . HYPERTENSION 09/28/2006  . Atrial fibrillation 09/28/2006    s/p afib ablation x 2  . Diastolic dysfunction 09/28/2006  . ALLERGIC RHINITIS 01/14/2007  . CHRONIC OBSTRUCTIVE PULMONARY DISEASE, ACUTE EXACERBATION 10/04/2008  . Long term current use of anticoagulant 04/08/2010  . Right knee DJD 07/09/2010  . Asbestos exposure   . Coronary artery disease 11/24/2011  . S/P CABG x 1 11/26/2011    Right internal mammary artery to right coronary artery  . S/P Maze operation for atrial fibrillation 11/26/2011    complete biatrial lesion set with clipping of LA appendage   Past Surgical History  Procedure Date  . Hand surgury     left  . Radiofrequency ablation for atrial flutter 2005    CTI  . Radiofrequency ablation for atrial fibrillation 12/30/07 and 06/05/09    afib ablation x 2 by JA  . Maze 11/26/2011    Procedure: MAZE;  Surgeon: Purcell Nails, MD;  Location: [facility] OR;  Service: Open Heart Surgery;  Laterality: N/A;  Cryomaze   . Coronary artery bypass graft 11/26/2011    Procedure: CORONARY ARTERY BYPASS GRAFTING (CABG);  Surgeon: Purcell Nails, MD;  Location: [facility] OR;  Service: Open Heart Surgery;  Laterality: N/A;  coronary artery bypass on pump times one utilizing the right  internal mammary artery, transesophageal echocardiogram     Current Outpatient Prescriptions  Medication Sig Dispense Refill  . acetaminophen (TYLENOL) 500 MG tablet Take 500 mg by mouth every 6 (six) hours as needed.      . ALPRAZolam (XANAX) 0.5 MG tablet Take 1 tablet (0.5 mg total) by mouth 2 (two) times daily as needed. For anxiety  60 tablet  2  . amiodarone (PACERONE) 100 MG tablet Take 1 tablet (100 mg total) by mouth daily.  30 tablet  1  . aspirin EC 81 MG EC tablet Take 1 tablet (81 mg total) by mouth daily.      . budesonide-formoterol (SYMBICORT) 160-4.5 MCG/ACT inhaler Inhale 2 puffs into the lungs 2 (two) times daily.  3 Inhaler  3  . citalopram (CELEXA) 20 MG tablet Take 20 mg by mouth daily.      . enalapril (VASOTEC) 10 MG tablet Take 10 mg by mouth daily.      Marland Kitchen HYDROcodone-acetaminophen (NORCO/VICODIN) 5-325 MG per tablet Take 1 tablet by mouth every 6 (six) hours as needed for pain.  120 tablet  1  . iron polysaccharides (NIFEREX) 150 MG capsule Take 1 capsule (150 mg total) by mouth daily. For one month then stop.  30 capsule  0  . lovastatin (MEVACOR) 40 MG tablet Take 80 mg by mouth daily. Take two tablets by mouth once daily      . temazepam (RESTORIL)  30 MG capsule Take 1 capsule (30 mg total) by mouth at bedtime as needed. For sleep  30 capsule  2  . warfarin (COUMADIN) 2.5 MG tablet Take 2.5-3.75 mg by mouth daily. Take 3.75mg  (1&1/2 tablet) on Sun. Take 2.5mg  (1 tablet) all other days)        Physical Exam: Filed Vitals:   01/19/12 0856  BP: 128/70  Pulse: 98  Height: 5\' 6"  (1.676 m)  Weight: 192 lb (87.091 kg)  SpO2: 98%    GEN- The patient is well appearing, alert and oriented x 3 today.   Head- normocephalic, atraumatic Eyes-  Sclera clear, conjunctiva pink Ears- hearing intact Oropharynx- clear Lungs- Clear to ausculation bilaterally, normal work of breathing Heart- Regular rate and rhythm, no murmurs, rubs or gallops, PMI not laterally  displaced GI- soft, NT, ND, + BS Extremities- no clubbing, cyanosis, or edema  ekg today reveals sinus rhythm 98 bpm, PR 202, otherwise normal ekg  Assessment and Plan:

## 2012-01-19 NOTE — Assessment & Plan Note (Signed)
Stable No change required today  

## 2012-01-19 NOTE — Assessment & Plan Note (Signed)
Doing well s/p maze Continue coumadin and low dose amiodarone  No changes

## 2012-01-19 NOTE — Patient Instructions (Signed)
Your physician recommends that you schedule a follow-up appointment in: 3 months with Dr Johney Frame  Non-Cardiac CT scanning, (CAT scanning), is a noninvasive, special x-ray that produces cross-sectional images of the body using x-rays and a computer. CT scans help physicians diagnose and treat medical conditions. For some CT exams, a contrast material is used to enhance visibility in the area of the body being studied. CT scans provide greater clarity and reveal more details than regular x-ray exams.

## 2012-01-19 NOTE — Assessment & Plan Note (Signed)
The patient has ongoing SOB of unclear etiology.  He has had recent scant hemoptysis.  Though prior CXR and PFTs were unrevealing, I think that a chest CT to exclude malignancy is necessary at this time.  No other workup is planned presently.

## 2012-01-29 ENCOUNTER — Telehealth: Payer: Self-pay

## 2012-01-29 MED ORDER — LOVASTATIN 40 MG PO TABS: 80.0000 mg | ORAL_TABLET | Freq: Every day | ORAL | Status: DC

## 2012-01-29 NOTE — Telephone Encounter (Signed)
Pt called requesting hard copy of Lovastatin for pick up. Rx printed and placed on MD's desk for signature.

## 2012-01-29 NOTE — Telephone Encounter (Signed)
Patient informed to pickup hardcopy at the front desk. 

## 2012-02-04 ENCOUNTER — Telehealth: Payer: Self-pay | Admitting: Internal Medicine

## 2012-02-04 NOTE — Telephone Encounter (Signed)
Caller: Aldean/Patient; Phone: [phone]; Reason for Call: Needs Rx for Lovastatin.  States picked up Rx from office, for lovastatin 40mg , 2 po qd, #180, RF x 3.  Per pharmacy/Rx Outreach, patient was told by pharmacy that the prescription received had no date, no instructions, no quantity, and no signature on it.  States can call pharmacy and give order over the phone [phone] option 0.  TC to pharmacy; order clarified as written above.  Krs/can

## 2012-02-04 NOTE — Telephone Encounter (Signed)
Ok - to robin to handle 

## 2012-02-05 NOTE — Telephone Encounter (Signed)
Done

## 2012-02-09 ENCOUNTER — Ambulatory Visit (INDEPENDENT_AMBULATORY_CARE_PROVIDER_SITE_OTHER)
Admission: RE | Admit: 2012-02-09 | Discharge: 2012-02-09 | Disposition: A | Discharge status: Home / Self Care | Payer: Self-pay | Source: Ambulatory Visit | Attending: Internal Medicine | Admitting: Internal Medicine

## 2012-02-09 ENCOUNTER — Ambulatory Visit (INDEPENDENT_AMBULATORY_CARE_PROVIDER_SITE_OTHER): Payer: Self-pay | Admitting: *Deleted

## 2012-02-09 ENCOUNTER — Ambulatory Visit: Payer: Self-pay | Admitting: Thoracic Surgery (Cardiothoracic Vascular Surgery)

## 2012-02-09 DIAGNOSIS — R042 Hemoptysis: Secondary | ICD-10-CM

## 2012-02-09 DIAGNOSIS — Z7901 Long term (current) use of anticoagulants: Secondary | ICD-10-CM

## 2012-02-09 DIAGNOSIS — R0602 Shortness of breath: Secondary | ICD-10-CM

## 2012-02-09 DIAGNOSIS — I4892 Unspecified atrial flutter: Secondary | ICD-10-CM

## 2012-02-09 DIAGNOSIS — I4891 Unspecified atrial fibrillation: Secondary | ICD-10-CM

## 2012-02-09 LAB — POCT INR: INR: 2.3

## 2012-02-12 ENCOUNTER — Encounter (HOSPITAL_COMMUNITY)
Admission: RE | Admit: 2012-02-12 | Discharge: 2012-02-12 | Disposition: A | Discharge status: Home / Self Care | Payer: Medicaid Other | Source: Ambulatory Visit | Attending: Internal Medicine | Admitting: Internal Medicine

## 2012-02-12 DIAGNOSIS — Z79899 Other long term (current) drug therapy: Secondary | ICD-10-CM | POA: Insufficient documentation

## 2012-02-12 DIAGNOSIS — Z951 Presence of aortocoronary bypass graft: Secondary | ICD-10-CM | POA: Insufficient documentation

## 2012-02-12 DIAGNOSIS — Z5189 Encounter for other specified aftercare: Secondary | ICD-10-CM | POA: Insufficient documentation

## 2012-02-12 DIAGNOSIS — I4891 Unspecified atrial fibrillation: Secondary | ICD-10-CM | POA: Insufficient documentation

## 2012-02-12 DIAGNOSIS — I2510 Atherosclerotic heart disease of native coronary artery without angina pectoris: Secondary | ICD-10-CM | POA: Insufficient documentation

## 2012-02-12 DIAGNOSIS — Z7901 Long term (current) use of anticoagulants: Secondary | ICD-10-CM | POA: Insufficient documentation

## 2012-02-12 NOTE — Progress Notes (Signed)
Cardiac Rehab Medication Review by a Pharmacist  Does the patient  feel that his/her medications are working for him/her?  yes  Has the patient been experiencing any side effects to the medications prescribed?  no  Does the patient measure his/her own blood pressure or blood glucose at home?  yes   Does the patient have any problems obtaining medications due to transportation or finances?   no  Understanding of regimen: good Understanding of indications: good Potential of compliance: good  Pharmacist comments: Medication list was reviewed for accuracy, indications, adverse effects, and compliance. Any patient questions were answered at this time.  Abran Duke, PharmD Clinical Pharmacist Phone: [phone] Pager: [phone] 02/12/2012 9:13 AM

## 2012-02-16 ENCOUNTER — Encounter: Payer: Self-pay | Admitting: Thoracic Surgery (Cardiothoracic Vascular Surgery)

## 2012-02-16 ENCOUNTER — Encounter (HOSPITAL_COMMUNITY)
Admission: RE | Admit: 2012-02-16 | Discharge: 2012-02-16 | Disposition: A | Discharge status: Home / Self Care | Payer: Medicaid Other | Source: Ambulatory Visit | Attending: Internal Medicine | Admitting: Internal Medicine

## 2012-02-16 ENCOUNTER — Ambulatory Visit (INDEPENDENT_AMBULATORY_CARE_PROVIDER_SITE_OTHER): Payer: Self-pay | Admitting: Thoracic Surgery (Cardiothoracic Vascular Surgery)

## 2012-02-16 VITALS — BP 135/86 | HR 90 | Resp 20 | Ht 66.0 in | Wt 186.0 lb

## 2012-02-16 DIAGNOSIS — I2510 Atherosclerotic heart disease of native coronary artery without angina pectoris: Secondary | ICD-10-CM

## 2012-02-16 DIAGNOSIS — Z951 Presence of aortocoronary bypass graft: Secondary | ICD-10-CM

## 2012-02-16 DIAGNOSIS — Z9889 Other specified postprocedural states: Secondary | ICD-10-CM

## 2012-02-16 DIAGNOSIS — Z8679 Personal history of other diseases of the circulatory system: Secondary | ICD-10-CM

## 2012-02-16 NOTE — Progress Notes (Signed)
[address]            [name] [address]          [phone]     CARDIOTHORACIC SURGERY OFFICE NOTE  Referring Provider is [name], MD PCP is Oliver Barre, MD   HPI:  Patient returns for routine followup status post coronary artery bypass grafting and Maze procedure on 11/26/2011.  He was last seen here in our office on 12/22/2011. Since then he has been seen by Dr. Johney Frame on one occasion. Overall he has continued to do quite well. He states that since surgery he has had 1 brief episode of paroxysmal atrial fibrillation it terminated spontaneously within 15-20 minutes. This occurred nearly 6 weeks ago. He has not had any other episodes of symptomatic tachypalpitations whatsoever. He still has soreness in his chest that has continued to gradually improve. He also notes that he still has some exertional shortness of breath that is slowly improving. He is currently participating in the cardiac rehabilitation program. He is back at work, and on days when he is not working he goes for a good long walk everyday. Overall he is pleased with his progress although he notes that his exercise tolerance is still back to normal and the soreness in his chest has not completely resolved. Otherwise he has no complaints.   Current Outpatient Prescriptions  Medication Sig Dispense Refill  . acetaminophen (TYLENOL) 500 MG tablet Take 500 mg by mouth every 6 (six) hours as needed.      . ALPRAZolam (XANAX) 0.5 MG tablet Take 1 tablet (0.5 mg total) by mouth 2 (two) times daily as needed. For anxiety  60 tablet  2  . amiodarone (PACERONE) 100 MG tablet Take 1 tablet (100 mg total) by mouth daily.  30 tablet  1  . aspirin EC 81 MG EC tablet Take 1 tablet (81 mg total) by mouth daily.      . budesonide-formoterol (SYMBICORT) 160-4.5 MCG/ACT inhaler Inhale 2 puffs into the lungs 2 (two) times daily.  3 Inhaler  3  . citalopram (CELEXA) 20 MG tablet Take 20 mg by mouth daily.       . enalapril (VASOTEC) 10 MG tablet Take 10 mg by mouth daily.      Marland Kitchen HYDROcodone-acetaminophen (NORCO/VICODIN) 5-325 MG per tablet Take 1 tablet by mouth every 6 (six) hours as needed for pain.  120 tablet  1  . lovastatin (MEVACOR) 40 MG tablet Take 2 tablets (80 mg total) by mouth daily. Take two tablets by mouth once daily  180 tablet  3  . temazepam (RESTORIL) 30 MG capsule Take 1 capsule (30 mg total) by mouth at bedtime as needed. For sleep  30 capsule  2  . warfarin (COUMADIN) 2.5 MG tablet Take 1.25-2.5 mg by mouth daily. Takes 1.77m (1/2 tablet) on Tues/Thur/Sat and 2.5mg  all other days, last INR good          Physical Exam:   BP 135/86  Pulse 90  Resp 20  Ht 5\' 6"  (1.676 m)  Wt 186 lb (84.369 kg)  BMI 30.02 kg/m2  SpO2 98%  General:  Well-appearing  Chest:   Clear to auscultation with symmetrical breath sounds  CV:   Regular rate and rhythm without murmur  Incisions:  Clean and dry and healing nicely, sternum is stable  Abdomen:  Soft and nontender  Extremities:  Warm  and well-perfused  Diagnostic Tests:  2 channel telemetry rhythm strip demonstrates normal sinus rhythm   Impression:  Patient is doing well 2-1/2 months status post Maze procedure and coronary artery bypass grafting.  Plan:  I've encouraged patient to continue to gradually increase his physical activity as tolerated. We have not made any changes in his current medications. All his questions been addressed. We'll plan to see him back in 3 months for routine followup and rhythm check.   [name]. Cornelius Moras, MD 02/16/2012 4:41 PM

## 2012-02-16 NOTE — Patient Instructions (Signed)
The patient should continue to avoid any heavy lifting or strenuous use of arms or shoulders for at least a total of three months from the time of surgery.  

## 2012-02-17 ENCOUNTER — Encounter (HOSPITAL_COMMUNITY): Payer: Self-pay

## 2012-02-17 NOTE — Progress Notes (Signed)
Pt started cardiac rehab today.  Pt tolerated light exercise without difficulty.  VSS, telemetry-NSR.  Asymptomatic. Pt oriented to exercise equipment and routine.  Understanding verbalized. 

## 2012-02-20 ENCOUNTER — Encounter (HOSPITAL_COMMUNITY)
Admission: RE | Admit: 2012-02-20 | Discharge: 2012-02-20 | Disposition: A | Discharge status: Home / Self Care | Payer: Medicaid Other | Source: Ambulatory Visit | Attending: Internal Medicine | Admitting: Internal Medicine

## 2012-02-23 ENCOUNTER — Encounter (HOSPITAL_COMMUNITY)
Admission: RE | Admit: 2012-02-23 | Discharge: 2012-02-23 | Disposition: A | Discharge status: Home / Self Care | Payer: Medicaid Other | Source: Ambulatory Visit | Attending: Internal Medicine | Admitting: Internal Medicine

## 2012-02-27 ENCOUNTER — Encounter (HOSPITAL_COMMUNITY)
Admission: RE | Admit: 2012-02-27 | Discharge: 2012-02-27 | Disposition: A | Discharge status: Home / Self Care | Payer: Medicaid Other | Source: Ambulatory Visit | Attending: Internal Medicine | Admitting: Internal Medicine

## 2012-02-27 DIAGNOSIS — I2510 Atherosclerotic heart disease of native coronary artery without angina pectoris: Secondary | ICD-10-CM | POA: Insufficient documentation

## 2012-02-27 DIAGNOSIS — Z7901 Long term (current) use of anticoagulants: Secondary | ICD-10-CM | POA: Insufficient documentation

## 2012-02-27 DIAGNOSIS — Z951 Presence of aortocoronary bypass graft: Secondary | ICD-10-CM | POA: Insufficient documentation

## 2012-02-27 DIAGNOSIS — I4891 Unspecified atrial fibrillation: Secondary | ICD-10-CM | POA: Insufficient documentation

## 2012-02-27 DIAGNOSIS — Z79899 Other long term (current) drug therapy: Secondary | ICD-10-CM | POA: Insufficient documentation

## 2012-02-27 DIAGNOSIS — Z5189 Encounter for other specified aftercare: Secondary | ICD-10-CM | POA: Insufficient documentation

## 2012-03-01 ENCOUNTER — Encounter (HOSPITAL_COMMUNITY)
Admission: RE | Admit: 2012-03-01 | Discharge: 2012-03-01 | Disposition: A | Discharge status: Home / Self Care | Payer: Medicaid Other | Source: Ambulatory Visit | Attending: Internal Medicine | Admitting: Internal Medicine

## 2012-03-01 ENCOUNTER — Other Ambulatory Visit: Payer: Self-pay

## 2012-03-01 MED ORDER — TEMAZEPAM 30 MG PO CAPS: 30.0000 mg | ORAL_CAPSULE | Freq: Every evening | ORAL | Status: DC | PRN

## 2012-03-01 MED ORDER — ALPRAZOLAM 0.5 MG PO TABS: 0.5000 mg | ORAL_TABLET | Freq: Two times a day (BID) | ORAL | Status: DC | PRN

## 2012-03-01 NOTE — Progress Notes (Signed)
Quality of life reviewed with pt.  Pt reports he is dissatisfied with his ability to breath r/t his chronic COPD.  This has not improved with inhaler use.  Pt encouraged to continue physical activity as tolerated to improve his functional ability. Pt reports this also contributes to his overall health dissatisfaction.  Pt is also dissatisfied with his current employment at the [employer].  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  Pt has been given vocational rehab application to complete.  Pt is content with his current housing however reports there are some potential crime related areas in close proximity to him.  Pt does have close neighbors that he can depend on and trust.  Overall pt feels he has adequate quality of life and demonstrates positive coping skills.   MD will be notified of his results.  Will continue to monitor.

## 2012-03-01 NOTE — Telephone Encounter (Signed)
Done hardcopy to robin  

## 2012-03-01 NOTE — Telephone Encounter (Signed)
Faxed hardcopy to pharmacy. 

## 2012-03-03 ENCOUNTER — Encounter (HOSPITAL_COMMUNITY)
Admission: RE | Admit: 2012-03-03 | Discharge: 2012-03-03 | Disposition: A | Discharge status: Home / Self Care | Payer: Medicaid Other | Source: Ambulatory Visit | Attending: Internal Medicine | Admitting: Internal Medicine

## 2012-03-04 ENCOUNTER — Other Ambulatory Visit: Payer: Self-pay | Admitting: Internal Medicine

## 2012-03-05 ENCOUNTER — Encounter (HOSPITAL_COMMUNITY)
Admission: RE | Admit: 2012-03-05 | Discharge: 2012-03-05 | Disposition: A | Discharge status: Home / Self Care | Payer: Medicaid Other | Source: Ambulatory Visit | Attending: Internal Medicine | Admitting: Internal Medicine

## 2012-03-05 NOTE — Progress Notes (Signed)
Pt c/o chest wall soreness.  Describes as a ache.  Pt states the pain in worsened with arm movement.  Pain has been persistent for 24 hours.  Offered to call cardiologist for appt.  Pt declined.  Pt reassured pain sounds musculoskeletal, however informed  should be evaluated if not relieved or worsens.  Pt verbalized understanding.

## 2012-03-08 ENCOUNTER — Encounter (HOSPITAL_COMMUNITY)
Admission: RE | Admit: 2012-03-08 | Discharge: 2012-03-08 | Disposition: A | Discharge status: Home / Self Care | Payer: Medicaid Other | Source: Ambulatory Visit | Attending: Internal Medicine | Admitting: Internal Medicine

## 2012-03-08 ENCOUNTER — Ambulatory Visit (INDEPENDENT_AMBULATORY_CARE_PROVIDER_SITE_OTHER): Payer: Self-pay | Admitting: *Deleted

## 2012-03-08 DIAGNOSIS — I4891 Unspecified atrial fibrillation: Secondary | ICD-10-CM

## 2012-03-08 DIAGNOSIS — I4892 Unspecified atrial flutter: Secondary | ICD-10-CM

## 2012-03-08 DIAGNOSIS — Z7901 Long term (current) use of anticoagulants: Secondary | ICD-10-CM

## 2012-03-08 LAB — POCT INR: INR: 2.1

## 2012-03-10 ENCOUNTER — Encounter (HOSPITAL_COMMUNITY)
Admission: RE | Admit: 2012-03-10 | Discharge: 2012-03-10 | Disposition: A | Discharge status: Home / Self Care | Payer: Medicaid Other | Source: Ambulatory Visit | Attending: Internal Medicine | Admitting: Internal Medicine

## 2012-03-12 ENCOUNTER — Encounter (HOSPITAL_COMMUNITY)
Admission: RE | Admit: 2012-03-12 | Discharge: 2012-03-12 | Disposition: A | Discharge status: Home / Self Care | Payer: Medicaid Other | Source: Ambulatory Visit | Attending: Internal Medicine | Admitting: Internal Medicine

## 2012-03-15 ENCOUNTER — Encounter (HOSPITAL_COMMUNITY)
Admission: RE | Admit: 2012-03-15 | Discharge: 2012-03-15 | Disposition: A | Discharge status: Home / Self Care | Payer: Medicaid Other | Source: Ambulatory Visit | Attending: Internal Medicine | Admitting: Internal Medicine

## 2012-03-15 NOTE — Progress Notes (Signed)
Reviewed home exercise with pt today.  Pt plans to walk daily for exercise.  Reviewed THR, pulse, RPE, sign and symptoms, NTG use, and when to call 911 or MD.  Pt voiced understanding.  

## 2012-03-17 ENCOUNTER — Encounter (HOSPITAL_COMMUNITY): Payer: Medicaid Other

## 2012-03-19 ENCOUNTER — Encounter (HOSPITAL_COMMUNITY): Payer: Medicaid Other

## 2012-03-22 ENCOUNTER — Encounter (HOSPITAL_COMMUNITY)
Admission: RE | Admit: 2012-03-22 | Discharge: 2012-03-22 | Disposition: A | Discharge status: Home / Self Care | Payer: Medicaid Other | Source: Ambulatory Visit | Attending: Internal Medicine | Admitting: Internal Medicine

## 2012-03-24 ENCOUNTER — Encounter (HOSPITAL_COMMUNITY): Payer: Medicaid Other

## 2012-03-26 ENCOUNTER — Encounter (HOSPITAL_COMMUNITY)
Admission: RE | Admit: 2012-03-26 | Discharge: 2012-03-26 | Disposition: A | Discharge status: Home / Self Care | Payer: Medicaid Other | Source: Ambulatory Visit | Attending: Internal Medicine | Admitting: Internal Medicine

## 2012-03-29 ENCOUNTER — Encounter (HOSPITAL_COMMUNITY)
Admission: RE | Admit: 2012-03-29 | Discharge: 2012-03-29 | Disposition: A | Discharge status: Home / Self Care | Payer: Medicaid Other | Source: Ambulatory Visit | Attending: Internal Medicine | Admitting: Internal Medicine

## 2012-03-29 DIAGNOSIS — Z951 Presence of aortocoronary bypass graft: Secondary | ICD-10-CM | POA: Insufficient documentation

## 2012-03-29 DIAGNOSIS — Z7901 Long term (current) use of anticoagulants: Secondary | ICD-10-CM | POA: Insufficient documentation

## 2012-03-29 DIAGNOSIS — I2510 Atherosclerotic heart disease of native coronary artery without angina pectoris: Secondary | ICD-10-CM | POA: Insufficient documentation

## 2012-03-29 DIAGNOSIS — Z5189 Encounter for other specified aftercare: Secondary | ICD-10-CM | POA: Insufficient documentation

## 2012-03-29 DIAGNOSIS — Z79899 Other long term (current) drug therapy: Secondary | ICD-10-CM | POA: Insufficient documentation

## 2012-03-29 DIAGNOSIS — I4891 Unspecified atrial fibrillation: Secondary | ICD-10-CM | POA: Insufficient documentation

## 2012-03-31 ENCOUNTER — Encounter (HOSPITAL_COMMUNITY)
Admission: RE | Admit: 2012-03-31 | Discharge: 2012-03-31 | Disposition: A | Discharge status: Home / Self Care | Payer: Medicaid Other | Source: Ambulatory Visit | Attending: Internal Medicine | Admitting: Internal Medicine

## 2012-04-02 ENCOUNTER — Encounter (HOSPITAL_COMMUNITY)
Admission: RE | Admit: 2012-04-02 | Discharge: 2012-04-02 | Disposition: A | Discharge status: Home / Self Care | Payer: Medicaid Other | Source: Ambulatory Visit | Attending: Internal Medicine | Admitting: Internal Medicine

## 2012-04-05 ENCOUNTER — Encounter (HOSPITAL_COMMUNITY)
Admission: RE | Admit: 2012-04-05 | Discharge: 2012-04-05 | Disposition: A | Discharge status: Home / Self Care | Payer: Medicaid Other | Source: Ambulatory Visit | Attending: Internal Medicine | Admitting: Internal Medicine

## 2012-04-07 ENCOUNTER — Encounter (HOSPITAL_COMMUNITY)
Admission: RE | Admit: 2012-04-07 | Discharge: 2012-04-07 | Disposition: A | Discharge status: Home / Self Care | Payer: Medicaid Other | Source: Ambulatory Visit | Attending: Internal Medicine | Admitting: Internal Medicine

## 2012-04-07 NOTE — Progress Notes (Signed)
[name] 57 y.o. male Nutrition Note Spoke with pt.  Nutrition Plan and Nutrition Survey goals reviewed with pt. Pt is following Step 2 of the Therapeutic Lifestyle Changes diet. Per nutrition screen, pt lost 10 lbs from his UBW of 195 lbs. Pt wt today reportedly 82.7 kg, which is down 1.8 kg (4 lb) over the past 6 weeks. Pt wants to continue to lose wt. Pt has been trying to lose wt by exercising more (walking 2 miles per day Mon-Thurs) and decreasing portion sizes consumed. Wt loss tips reviewed. Pt expressed understanding of the information reviewed. Pt has attended nutrition classes offered. Pt states he has been on Coumadin for 6 years. Pt aware of foods that are high in vitamin K and the need to follow a diet consistent in vitamin K intake. Per nutrition screen, pt with financial difficulty buying food. Per discussion with pt, pt states he is "mostly cheap." Pt denies food scarcity. Pt did report that he had a poor appetite. Pt states he "sometimes forget to eat lunch." Pt encouraged to set his phone alarm to remind him to eat. Pt feels an alarm reminder would help.   Nutrition Diagnosis   Food-and nutrition-related knowledge deficit related to lack of exposure to information as related to diagnosis of: ? CVD ?    Obesity related to excessive energy intake as evidenced by a BMI of 30.3  Nutrition RX/ Estimated Daily Nutrition Needs for: wt loss  1300-1800 Kcal, 35-50 gm fat, 8-14 gm sat fat, 1.3-1.8 gm trans-fat, <1500 mg sodium   Nutrition Intervention   Pt's individual nutrition plan reviewed with pt.   Benefits of adopting Therapeutic Lifestyle Changes discussed when Medficts reviewed.   Pt to attend the Portion Distortion class   Pt to attend the  ? Nutrition I class - met 2/4/214                    ? Nutrition II class - met 04/06/12   Pt given handouts for: ? Difficulty buying food   Pt to set his phone alarm to remind him to eat lunch.   Continue client-centered nutrition  education by RD, as part of interdisciplinary care.  Goal(s)   Pt to identify food quantities necessary to achieve: ? wt loss to a goal wt of 162-174 lb (73.6-79.0 kg) at graduation from cardiac rehab.   Monitor and Evaluate progress toward nutrition goal with team. Nutrition Risk:  Low

## 2012-04-09 ENCOUNTER — Encounter (HOSPITAL_COMMUNITY): Payer: Medicaid Other

## 2012-04-10 ENCOUNTER — Other Ambulatory Visit: Payer: Self-pay

## 2012-04-12 ENCOUNTER — Encounter (HOSPITAL_COMMUNITY): Payer: Medicaid Other

## 2012-04-14 ENCOUNTER — Encounter (HOSPITAL_COMMUNITY): Payer: Medicaid Other

## 2012-04-16 ENCOUNTER — Encounter (HOSPITAL_COMMUNITY): Payer: Medicaid Other

## 2012-04-19 ENCOUNTER — Encounter (HOSPITAL_COMMUNITY)
Admission: RE | Admit: 2012-04-19 | Discharge: 2012-04-19 | Disposition: A | Discharge status: Home / Self Care | Payer: Medicaid Other | Source: Ambulatory Visit | Attending: Internal Medicine | Admitting: Internal Medicine

## 2012-04-19 ENCOUNTER — Telehealth (HOSPITAL_COMMUNITY): Payer: Self-pay | Admitting: Cardiac Rehabilitation

## 2012-04-19 ENCOUNTER — Ambulatory Visit (INDEPENDENT_AMBULATORY_CARE_PROVIDER_SITE_OTHER): Payer: Self-pay

## 2012-04-19 DIAGNOSIS — Z7901 Long term (current) use of anticoagulants: Secondary | ICD-10-CM

## 2012-04-19 DIAGNOSIS — I4891 Unspecified atrial fibrillation: Secondary | ICD-10-CM

## 2012-04-19 DIAGNOSIS — I4892 Unspecified atrial flutter: Secondary | ICD-10-CM

## 2012-04-19 LAB — POCT INR: INR: 1.6

## 2012-04-19 NOTE — Telephone Encounter (Signed)
Phone call received from patient will be absent from cardiac rehab for cold symptoms.

## 2012-04-21 ENCOUNTER — Encounter (HOSPITAL_COMMUNITY)
Admission: RE | Admit: 2012-04-21 | Discharge: 2012-04-21 | Disposition: A | Discharge status: Home / Self Care | Payer: Medicaid Other | Source: Ambulatory Visit | Attending: Internal Medicine | Admitting: Internal Medicine

## 2012-04-21 ENCOUNTER — Encounter: Payer: Self-pay | Admitting: Internal Medicine

## 2012-04-21 ENCOUNTER — Ambulatory Visit (INDEPENDENT_AMBULATORY_CARE_PROVIDER_SITE_OTHER): Payer: Self-pay | Admitting: Internal Medicine

## 2012-04-21 VITALS — BP 137/85 | HR 90 | Ht 66.0 in | Wt 178.0 lb

## 2012-04-21 DIAGNOSIS — I10 Essential (primary) hypertension: Secondary | ICD-10-CM

## 2012-04-21 DIAGNOSIS — I2510 Atherosclerotic heart disease of native coronary artery without angina pectoris: Secondary | ICD-10-CM

## 2012-04-21 DIAGNOSIS — I4891 Unspecified atrial fibrillation: Secondary | ICD-10-CM

## 2012-04-21 DIAGNOSIS — I480 Paroxysmal atrial fibrillation: Secondary | ICD-10-CM

## 2012-04-21 NOTE — Assessment & Plan Note (Signed)
Stable No change required today  

## 2012-04-21 NOTE — Progress Notes (Signed)
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 male who presents today for routine electrophysiology followup.  Since last being seen in our clinic, the patient reports doing well.  His SOB is improved.  His exercise tolerance is improved with sinus rhythm.  His afib is controlled. Today, he denies symptoms of chest pain,  lower extremity edema, dizziness, presyncope, or syncope.  The patient is otherwise without complaint today.   Past Medical History  Diagnosis Date  . HYPOTHYROIDISM, ACQUIRED NEC 09/28/2006  . HYPERLIPIDEMIA 09/28/2006  . ANXIETY 09/28/2006  . OBSTRUCTIVE SLEEP APNEA 01/06/2008  . HYPERTENSION 09/28/2006  . Atrial fibrillation 09/28/2006    s/p afib ablation x 2  . Diastolic dysfunction 09/28/2006  . ALLERGIC RHINITIS 01/14/2007  . CHRONIC OBSTRUCTIVE PULMONARY DISEASE, ACUTE EXACERBATION 10/04/2008  . Long term current use of anticoagulant 04/08/2010  . Right knee DJD 07/09/2010  . Asbestos exposure   . Coronary artery disease 11/24/2011  . S/P CABG x 1 11/26/2011    Right internal mammary artery to right coronary artery  . S/P Maze operation for atrial fibrillation 11/26/2011    complete biatrial lesion set with clipping of LA appendage   Past Surgical History  Procedure Laterality Date  . Hand surgury      left  . Radiofrequency ablation for atrial flutter  2005    CTI  . Radiofrequency ablation for atrial fibrillation  12/30/07 and 06/05/09    afib ablation x 2 by JA  . Maze  11/26/2011    Procedure: MAZE;  Surgeon: Purcell Nails, MD;  Location: [facility] OR;  Service: Open Heart Surgery;  Laterality: N/A;  Cryomaze   . Coronary artery bypass graft  11/26/2011    Procedure: CORONARY ARTERY BYPASS GRAFTING (CABG);  Surgeon: Purcell Nails, MD;  Location: [facility] OR;  Service: Open Heart Surgery;  Laterality: N/A;  coronary artery bypass on pump times one utilizing the right internal mammary artery, transesophageal echocardiogram     Current  Outpatient Prescriptions  Medication Sig Dispense Refill  . acetaminophen (TYLENOL) 500 MG tablet Take 500 mg by mouth every 6 (six) hours as needed.      . ALPRAZolam (XANAX) 0.5 MG tablet Take 1 tablet (0.5 mg total) by mouth 2 (two) times daily as needed. For anxiety  60 tablet  2  . aspirin EC 81 MG EC tablet Take 1 tablet (81 mg total) by mouth daily.      . budesonide-formoterol (SYMBICORT) 160-4.5 MCG/ACT inhaler Inhale 2 puffs into the lungs 2 (two) times daily.  3 Inhaler  3  . citalopram (CELEXA) 20 MG tablet Take 20 mg by mouth daily.      . citalopram (CELEXA) 20 MG tablet TAKE ONE TABLET BY MOUTH ONE TIME DAILY  90 tablet  1  . enalapril (VASOTEC) 10 MG tablet Take 10 mg by mouth daily.      Marland Kitchen HYDROcodone-acetaminophen (NORCO/VICODIN) 5-325 MG per tablet Take 1 tablet by mouth every 6 (six) hours as needed for pain.  120 tablet  1  . lovastatin (MEVACOR) 40 MG tablet Take 2 tablets (80 mg total) by mouth daily. Take two tablets by mouth once daily  180 tablet  3  . temazepam (RESTORIL) 30 MG capsule Take 1 capsule (30 mg total) by mouth at bedtime as needed. For sleep  30 capsule  2  . warfarin (COUMADIN) 2.5 MG tablet Take 1.25-2.5 mg by mouth daily. Takes 1.48m (1/2 tablet)  on Tues/Thur/Sat and 2.5mg  all other days, last INR good       No current facility-administered medications for this visit.    Physical Exam: Filed Vitals:   04/21/12 1039  BP: 137/85  Pulse: 90  Height: 5\' 6"  (1.676 m)  Weight: 178 lb (80.74 kg)    GEN- The patient is well appearing, alert and oriented x 3 today.   Head- normocephalic, atraumatic Eyes-  Sclera clear, conjunctiva pink Ears- hearing intact Oropharynx- clear Lungs- Clear to ausculation bilaterally, normal work of breathing Heart- Regular rate and rhythm, no murmurs, rubs or gallops, PMI not laterally displaced GI- soft, NT, ND, + BS Extremities- no clubbing, cyanosis, or edema  ekg today reveals sinus rhythm 78 bpm, PR 212,  otherwise normal ekg  Assessment and Plan:

## 2012-04-21 NOTE — Patient Instructions (Signed)
Your physician wants you to follow-up in: 6 months with Dr Allred You will receive a reminder letter in the mail two months in advance. If you don't receive a letter, please call our office to schedule the follow-up appointment.    Your physician has recommended you make the following change in your medication:  1) Stop Amiodarone   

## 2012-04-21 NOTE — Assessment & Plan Note (Signed)
Controlled Stop amiodarone today Continue coumadin

## 2012-04-23 ENCOUNTER — Encounter (HOSPITAL_COMMUNITY)
Admission: RE | Admit: 2012-04-23 | Discharge: 2012-04-23 | Disposition: A | Discharge status: Home / Self Care | Payer: Medicaid Other | Source: Ambulatory Visit | Attending: Internal Medicine | Admitting: Internal Medicine

## 2012-04-26 ENCOUNTER — Encounter (HOSPITAL_COMMUNITY)
Admission: RE | Admit: 2012-04-26 | Discharge: 2012-04-26 | Disposition: A | Discharge status: Home / Self Care | Payer: Medicaid Other | Source: Ambulatory Visit | Attending: Internal Medicine | Admitting: Internal Medicine

## 2012-04-26 DIAGNOSIS — Z79899 Other long term (current) drug therapy: Secondary | ICD-10-CM | POA: Insufficient documentation

## 2012-04-26 DIAGNOSIS — Z7901 Long term (current) use of anticoagulants: Secondary | ICD-10-CM | POA: Insufficient documentation

## 2012-04-26 DIAGNOSIS — Z951 Presence of aortocoronary bypass graft: Secondary | ICD-10-CM | POA: Insufficient documentation

## 2012-04-26 DIAGNOSIS — Z5189 Encounter for other specified aftercare: Secondary | ICD-10-CM | POA: Insufficient documentation

## 2012-04-26 DIAGNOSIS — I2510 Atherosclerotic heart disease of native coronary artery without angina pectoris: Secondary | ICD-10-CM | POA: Insufficient documentation

## 2012-04-26 DIAGNOSIS — I4891 Unspecified atrial fibrillation: Secondary | ICD-10-CM | POA: Insufficient documentation

## 2012-04-28 ENCOUNTER — Encounter (HOSPITAL_COMMUNITY)
Admission: RE | Admit: 2012-04-28 | Discharge: 2012-04-28 | Disposition: A | Discharge status: Home / Self Care | Payer: Medicaid Other | Source: Ambulatory Visit | Attending: Internal Medicine | Admitting: Internal Medicine

## 2012-04-30 ENCOUNTER — Encounter (HOSPITAL_COMMUNITY): Payer: Medicaid Other

## 2012-05-03 ENCOUNTER — Encounter (HOSPITAL_COMMUNITY)
Admission: RE | Admit: 2012-05-03 | Discharge: 2012-05-03 | Disposition: A | Discharge status: Home / Self Care | Payer: Medicaid Other | Source: Ambulatory Visit | Attending: Internal Medicine | Admitting: Internal Medicine

## 2012-05-03 NOTE — Progress Notes (Signed)
Pt c/o tachypalpitations lasting approximately 2 hours last night at work.  He reports they were associated with chest tightness, dyspnea, left shoulder aching and visual disturbances.  Symptoms relieved with rest.  Pt denies symptoms today and in fact participated in all exercise stations prior to reporting this.  He exercised without symptoms.  appt scheduled with Tereso Newcomer, PA Friday 05/07/12 at 950. (first available)  Pt instructed to notify office if symptom persist or worsen prior to appt and present to ED for severe unrelieved symptoms.  Pt verbalized understanding

## 2012-05-04 ENCOUNTER — Other Ambulatory Visit: Payer: Self-pay | Admitting: Internal Medicine

## 2012-05-04 ENCOUNTER — Ambulatory Visit (INDEPENDENT_AMBULATORY_CARE_PROVIDER_SITE_OTHER): Payer: Self-pay | Admitting: Pharmacist

## 2012-05-04 DIAGNOSIS — I4891 Unspecified atrial fibrillation: Secondary | ICD-10-CM

## 2012-05-04 DIAGNOSIS — Z7901 Long term (current) use of anticoagulants: Secondary | ICD-10-CM

## 2012-05-04 DIAGNOSIS — I4892 Unspecified atrial flutter: Secondary | ICD-10-CM

## 2012-05-04 LAB — POCT INR: INR: 1.6

## 2012-05-05 ENCOUNTER — Encounter (HOSPITAL_COMMUNITY)
Admission: RE | Admit: 2012-05-05 | Discharge: 2012-05-05 | Disposition: A | Discharge status: Home / Self Care | Payer: Medicaid Other | Source: Ambulatory Visit | Attending: Internal Medicine | Admitting: Internal Medicine

## 2012-05-07 ENCOUNTER — Encounter (HOSPITAL_COMMUNITY): Payer: Medicaid Other

## 2012-05-07 ENCOUNTER — Ambulatory Visit: Payer: Self-pay | Admitting: Physician Assistant

## 2012-05-10 ENCOUNTER — Encounter (HOSPITAL_COMMUNITY)
Admission: RE | Admit: 2012-05-10 | Discharge: 2012-05-10 | Disposition: A | Discharge status: Home / Self Care | Payer: Medicaid Other | Source: Ambulatory Visit | Attending: Internal Medicine | Admitting: Internal Medicine

## 2012-05-12 ENCOUNTER — Encounter (HOSPITAL_COMMUNITY)
Admission: RE | Admit: 2012-05-12 | Discharge: 2012-05-12 | Disposition: A | Discharge status: Home / Self Care | Payer: Medicaid Other | Source: Ambulatory Visit | Attending: Internal Medicine | Admitting: Internal Medicine

## 2012-05-14 ENCOUNTER — Ambulatory Visit (INDEPENDENT_AMBULATORY_CARE_PROVIDER_SITE_OTHER): Payer: Self-pay

## 2012-05-14 ENCOUNTER — Encounter (HOSPITAL_COMMUNITY)
Admission: RE | Admit: 2012-05-14 | Discharge: 2012-05-14 | Disposition: A | Discharge status: Home / Self Care | Payer: Medicaid Other | Source: Ambulatory Visit | Attending: Internal Medicine | Admitting: Internal Medicine

## 2012-05-14 DIAGNOSIS — I4892 Unspecified atrial flutter: Secondary | ICD-10-CM

## 2012-05-14 DIAGNOSIS — Z7901 Long term (current) use of anticoagulants: Secondary | ICD-10-CM

## 2012-05-14 DIAGNOSIS — I4891 Unspecified atrial fibrillation: Secondary | ICD-10-CM

## 2012-05-17 ENCOUNTER — Ambulatory Visit (INDEPENDENT_AMBULATORY_CARE_PROVIDER_SITE_OTHER): Payer: Self-pay | Admitting: Thoracic Surgery (Cardiothoracic Vascular Surgery)

## 2012-05-17 ENCOUNTER — Encounter: Payer: Self-pay | Admitting: Thoracic Surgery (Cardiothoracic Vascular Surgery)

## 2012-05-17 ENCOUNTER — Encounter (HOSPITAL_COMMUNITY)
Admission: RE | Admit: 2012-05-17 | Discharge: 2012-05-17 | Disposition: A | Discharge status: Home / Self Care | Payer: Medicaid Other | Source: Ambulatory Visit | Attending: Internal Medicine | Admitting: Internal Medicine

## 2012-05-17 VITALS — BP 140/89 | HR 92 | Resp 20 | Ht 66.0 in | Wt 178.0 lb

## 2012-05-17 DIAGNOSIS — Z951 Presence of aortocoronary bypass graft: Secondary | ICD-10-CM

## 2012-05-17 DIAGNOSIS — Z9889 Other specified postprocedural states: Secondary | ICD-10-CM

## 2012-05-17 DIAGNOSIS — I2510 Atherosclerotic heart disease of native coronary artery without angina pectoris: Secondary | ICD-10-CM

## 2012-05-17 DIAGNOSIS — Z8679 Personal history of other diseases of the circulatory system: Secondary | ICD-10-CM

## 2012-05-17 MED ORDER — METOPROLOL SUCCINATE ER 25 MG PO TB24: 25.0000 mg | ORAL_TABLET | Freq: Every day | ORAL | Status: DC

## 2012-05-17 NOTE — Patient Instructions (Signed)
Begin Toprol and f/u with Dr Johney Frame to discuss benefits of long term beta blocker with h/o atrial fibrillation and coronary artery disease

## 2012-05-17 NOTE — Progress Notes (Signed)
[address]            [name] [address]          [phone]     CARDIOTHORACIC SURGERY OFFICE NOTE  Referring Provider is [name], MD PCP is Oliver Barre, MD   HPI:  Patient returns for followup nearly 6 months status post Maze procedure and coronary artery bypass grafting.  He was last seen here in the office on 02/16/2012. Since then he has continued to do well. He is actively participating in the cardiac rehabilitation program and he notes that his exercise tolerance is continued to gradually improve. He was seen in followup by Dr. Johney Frame last month  And taken off of amiodarone. He states that shortly after that he had two brief episodes where he felt his heart racing for a period of 20 minutes or so. Both of these episodes terminated spontaneously and he has not had any recurrence episodes since.  He denies any problems with chest discomfort although he notes that he pushes himself strenuously he seems to get mild shortness of breath and vague heaviness in his chest. This only occurs with strenuous physical activity. He otherwise feels quite well. He has very mild residual soreness in his chest related to a sternotomy and this has almost completely resolved. Overall he is pleased with his progress.   Current Outpatient Prescriptions  Medication Sig Dispense Refill  . acetaminophen (TYLENOL) 500 MG tablet Take 500 mg by mouth every 6 (six) hours as needed.      . ALPRAZolam (XANAX) 0.5 MG tablet Take 1 tablet (0.5 mg total) by mouth 2 (two) times daily as needed. For anxiety  60 tablet  2  . aspirin EC 81 MG EC tablet Take 1 tablet (81 mg total) by mouth daily.      . budesonide-formoterol (SYMBICORT) 160-4.5 MCG/ACT inhaler Inhale 2 puffs into the lungs 2 (two) times daily.  3 Inhaler  3  . citalopram (CELEXA) 20 MG tablet TAKE ONE TABLET BY MOUTH ONE TIME DAILY  90 tablet  1  . enalapril (VASOTEC) 10 MG tablet TAKE ONE TABLET BY MOUTH ONE TIME  DAILY  90 tablet  2  . HYDROcodone-acetaminophen (NORCO/VICODIN) 5-325 MG per tablet Take 1 tablet by mouth every 6 (six) hours as needed for pain.  120 tablet  1  . lovastatin (MEVACOR) 40 MG tablet Take 2 tablets (80 mg total) by mouth daily. Take two tablets by mouth once daily  180 tablet  3  . temazepam (RESTORIL) 30 MG capsule Take 1 capsule (30 mg total) by mouth at bedtime as needed. For sleep  30 capsule  2  . warfarin (COUMADIN) 2.5 MG tablet Take 1.25-2.5 mg by mouth daily. Takes 1.19m (1/2 tablet) on Tues/Thur/Sat and 2.5mg  all other days, last INR good       No current facility-administered medications for this visit.      Physical Exam:   BP 140/89  Pulse 92  Resp 20  Ht 5\' 6"  (1.676 m)  Wt 178 lb (80.74 kg)  BMI 28.74 kg/m2  SpO2 98%  General:  Well-appearing  Chest:   Clear to auscultation with symmetrical breath sounds  CV:   Regular rate and rhythm without murmur  Incisions:  Sternotomy is healed completely and the sternum is stable  Abdomen:  Soft nontender  Extremities:  Warm and well-perfused  Diagnostic Tests:  2 channel telemetry rhythm strip demonstrates normal sinus rhythm   Impression:  The patient is clinically doing well nearly 6 months following coronary artery bypass grafting and Maze procedure. He does report two brief episodes of what may have been recurrent atrial fibrillation or atrial flutter last month shortly after he stopped taking amiodarone. He has not had any further episodes since that time. He is not on a beta blocker.  Plan:  I think it would be reasonable for [name] to remain on a beta blocker now that amiodarone has been stopped given his history of coronary artery disease and paroxysmal atrial fibrillation.  Will start Toprol-XL today and discuss with Dr. Johney Frame to make certain that he agrees. I've instructed [name] to contact Dr. Jenel Lucks office if he has any further episodes of tachypalpitations to discuss whether to resume  amiodarone therapy or consider placement of an event monitor.  Will also leave to Dr. Johney Frame discretion whether or not to consider followup stress test at some point in the future.  We will plan to have him return in 6 months for further followup.   [name]. Cornelius Moras, MD 05/17/2012 10:51 AM

## 2012-05-19 ENCOUNTER — Encounter (HOSPITAL_COMMUNITY)
Admission: RE | Admit: 2012-05-19 | Discharge: 2012-05-19 | Disposition: A | Discharge status: Home / Self Care | Payer: Medicaid Other | Source: Ambulatory Visit | Attending: Internal Medicine | Admitting: Internal Medicine

## 2012-05-21 ENCOUNTER — Encounter (HOSPITAL_COMMUNITY): Payer: Medicaid Other

## 2012-05-24 ENCOUNTER — Other Ambulatory Visit: Payer: Self-pay

## 2012-05-24 ENCOUNTER — Encounter (HOSPITAL_COMMUNITY)
Admission: RE | Admit: 2012-05-24 | Discharge: 2012-05-24 | Disposition: A | Discharge status: Home / Self Care | Payer: Medicaid Other | Source: Ambulatory Visit | Attending: Internal Medicine | Admitting: Internal Medicine

## 2012-05-24 MED ORDER — HYDROCODONE-ACETAMINOPHEN 5-325 MG PO TABS: 1.0000 | ORAL_TABLET | Freq: Four times a day (QID) | ORAL | Status: DC | PRN

## 2012-05-24 NOTE — Telephone Encounter (Signed)
Faxed hardcopy to pharmacy. 

## 2012-05-24 NOTE — Telephone Encounter (Signed)
Done hardcopy to robin  

## 2012-05-26 ENCOUNTER — Encounter (HOSPITAL_COMMUNITY)
Admission: RE | Admit: 2012-05-26 | Discharge: 2012-05-26 | Disposition: A | Discharge status: Home / Self Care | Payer: Medicaid Other | Source: Ambulatory Visit | Attending: Internal Medicine | Admitting: Internal Medicine

## 2012-05-26 DIAGNOSIS — Z951 Presence of aortocoronary bypass graft: Secondary | ICD-10-CM | POA: Insufficient documentation

## 2012-05-26 DIAGNOSIS — I2510 Atherosclerotic heart disease of native coronary artery without angina pectoris: Secondary | ICD-10-CM | POA: Insufficient documentation

## 2012-05-26 DIAGNOSIS — I4891 Unspecified atrial fibrillation: Secondary | ICD-10-CM | POA: Insufficient documentation

## 2012-05-26 DIAGNOSIS — Z5189 Encounter for other specified aftercare: Secondary | ICD-10-CM | POA: Insufficient documentation

## 2012-05-26 DIAGNOSIS — Z79899 Other long term (current) drug therapy: Secondary | ICD-10-CM | POA: Insufficient documentation

## 2012-05-26 DIAGNOSIS — Z7901 Long term (current) use of anticoagulants: Secondary | ICD-10-CM | POA: Insufficient documentation

## 2012-05-28 ENCOUNTER — Ambulatory Visit (INDEPENDENT_AMBULATORY_CARE_PROVIDER_SITE_OTHER): Payer: Medicaid Other | Admitting: Pharmacist

## 2012-05-28 ENCOUNTER — Encounter (HOSPITAL_COMMUNITY)
Admission: RE | Admit: 2012-05-28 | Discharge: 2012-05-28 | Disposition: A | Discharge status: Home / Self Care | Payer: Medicaid Other | Source: Ambulatory Visit | Attending: Internal Medicine | Admitting: Internal Medicine

## 2012-05-28 DIAGNOSIS — I4892 Unspecified atrial flutter: Secondary | ICD-10-CM

## 2012-05-28 DIAGNOSIS — Z7901 Long term (current) use of anticoagulants: Secondary | ICD-10-CM

## 2012-05-28 DIAGNOSIS — I4891 Unspecified atrial fibrillation: Secondary | ICD-10-CM

## 2012-05-28 LAB — POCT INR: INR: 1.6

## 2012-05-28 NOTE — Progress Notes (Signed)
Pt in today for the 8:15 exercise class. Pt remarked that he had some incisional tenderness and sob that had not improved.  Pt admitted that he lifted on Sunday a large bucket of ice over his head three times.  Pt realized after he lifted the bucket that he probably shouldn't have lifted the bucket.  Pt has history of COPD and had not followed up with his pulmonary MD  since he had surgery.  Advised pt he should make a follow up appt.  Offered assistance pt declined.  Continue to monitor.

## 2012-05-31 ENCOUNTER — Encounter (HOSPITAL_COMMUNITY)
Admission: RE | Admit: 2012-05-31 | Discharge: 2012-05-31 | Disposition: A | Discharge status: Home / Self Care | Payer: Medicaid Other | Source: Ambulatory Visit | Attending: Internal Medicine | Admitting: Internal Medicine

## 2012-06-02 ENCOUNTER — Encounter (HOSPITAL_COMMUNITY)
Admission: RE | Admit: 2012-06-02 | Discharge: 2012-06-02 | Disposition: A | Discharge status: Home / Self Care | Payer: Medicaid Other | Source: Ambulatory Visit | Attending: Internal Medicine | Admitting: Internal Medicine

## 2012-06-03 ENCOUNTER — Other Ambulatory Visit: Payer: Self-pay

## 2012-06-03 MED ORDER — ALPRAZOLAM 0.5 MG PO TABS: 0.5000 mg | ORAL_TABLET | Freq: Two times a day (BID) | ORAL | Status: DC | PRN

## 2012-06-03 MED ORDER — TEMAZEPAM 30 MG PO CAPS: 30.0000 mg | ORAL_CAPSULE | Freq: Every evening | ORAL | Status: DC | PRN

## 2012-06-03 NOTE — Telephone Encounter (Signed)
Done hardcopy to robin  

## 2012-06-04 ENCOUNTER — Encounter (HOSPITAL_COMMUNITY)
Admission: RE | Admit: 2012-06-04 | Discharge: 2012-06-04 | Disposition: A | Discharge status: Home / Self Care | Payer: Medicaid Other | Source: Ambulatory Visit | Attending: Internal Medicine | Admitting: Internal Medicine

## 2012-06-04 ENCOUNTER — Encounter (HOSPITAL_COMMUNITY): Payer: Self-pay

## 2012-06-04 NOTE — Telephone Encounter (Signed)
Faxed hardcopy to pharmacy. 

## 2012-06-04 NOTE — Progress Notes (Signed)
Pt graduated from cardiac rehab today. Pt has made significant lifestyle changes and should be commended for his success.  Pt plans to exercise on his own at [employer].  Pt reports he had episodes of tachypalpitations last weekend while working.   Pt denies episodes this week and nothing with exercise.   Pt states he did not notify his cardiologist to report these symptoms however they are concerning to him. Pt instructed to call cardiologist for appointment to discuss. Offered to assist patient with making appt. Pt declined,stating he would call himself. Will forward report to Dr. Johney Frame.  Pt reports improved quality of life and psychosocial. Pt states he feels better. Pt admits he battles chronic depression however he feels this is improved.  Pt was a pleasure to work with.  Thank you for the referral.

## 2012-06-07 ENCOUNTER — Encounter (HOSPITAL_COMMUNITY): Payer: Medicaid Other

## 2012-06-09 ENCOUNTER — Encounter (HOSPITAL_COMMUNITY): Payer: Medicaid Other

## 2012-06-10 ENCOUNTER — Ambulatory Visit (INDEPENDENT_AMBULATORY_CARE_PROVIDER_SITE_OTHER): Payer: Medicaid Other

## 2012-06-10 DIAGNOSIS — I4891 Unspecified atrial fibrillation: Secondary | ICD-10-CM

## 2012-06-10 DIAGNOSIS — I2510 Atherosclerotic heart disease of native coronary artery without angina pectoris: Secondary | ICD-10-CM

## 2012-06-10 DIAGNOSIS — I4892 Unspecified atrial flutter: Secondary | ICD-10-CM

## 2012-06-10 DIAGNOSIS — Z7901 Long term (current) use of anticoagulants: Secondary | ICD-10-CM

## 2012-06-10 MED ORDER — METOPROLOL SUCCINATE ER 25 MG PO TB24: 25.0000 mg | ORAL_TABLET | Freq: Every day | ORAL | Status: DC

## 2012-06-11 ENCOUNTER — Encounter (HOSPITAL_COMMUNITY): Payer: Medicaid Other

## 2012-06-14 ENCOUNTER — Other Ambulatory Visit: Payer: Self-pay | Admitting: Internal Medicine

## 2012-06-14 ENCOUNTER — Encounter (HOSPITAL_COMMUNITY): Payer: Medicaid Other

## 2012-06-16 ENCOUNTER — Encounter (HOSPITAL_COMMUNITY): Payer: Medicaid Other

## 2012-06-18 ENCOUNTER — Encounter (HOSPITAL_COMMUNITY): Payer: Medicaid Other

## 2012-06-23 ENCOUNTER — Encounter: Payer: Self-pay | Admitting: Internal Medicine

## 2012-06-23 ENCOUNTER — Ambulatory Visit (INDEPENDENT_AMBULATORY_CARE_PROVIDER_SITE_OTHER): Payer: Medicaid Other | Admitting: Internal Medicine

## 2012-06-23 VITALS — BP 150/90 | HR 71 | Temp 98.4°F | Ht 66.0 in | Wt 180.8 lb

## 2012-06-23 DIAGNOSIS — I480 Paroxysmal atrial fibrillation: Secondary | ICD-10-CM

## 2012-06-23 DIAGNOSIS — J449 Chronic obstructive pulmonary disease, unspecified: Secondary | ICD-10-CM

## 2012-06-23 DIAGNOSIS — R002 Palpitations: Secondary | ICD-10-CM

## 2012-06-23 DIAGNOSIS — I10 Essential (primary) hypertension: Secondary | ICD-10-CM

## 2012-06-23 DIAGNOSIS — I4891 Unspecified atrial fibrillation: Secondary | ICD-10-CM

## 2012-06-23 DIAGNOSIS — J4489 Other specified chronic obstructive pulmonary disease: Secondary | ICD-10-CM

## 2012-06-23 DIAGNOSIS — R079 Chest pain, unspecified: Secondary | ICD-10-CM

## 2012-06-23 DIAGNOSIS — I25810 Atherosclerosis of coronary artery bypass graft(s) without angina pectoris: Secondary | ICD-10-CM

## 2012-06-23 MED ORDER — METOPROLOL SUCCINATE ER 50 MG PO TB24: 50.0000 mg | ORAL_TABLET | Freq: Every day | ORAL | Status: DC

## 2012-06-23 NOTE — Assessment & Plan Note (Signed)
?   Recent episodes, for f/u with Dr Allred/card

## 2012-06-23 NOTE — Progress Notes (Signed)
Phone call to pt. He forgot to ask Dr Jonny Ruiz about a lab result comment that stated possible kidney disease along with other information. Per Dr Jonny Ruiz does not mean anything for the pt. He had no questions or concerns.

## 2012-06-23 NOTE — Progress Notes (Signed)
Subjective:    Patient ID: Scott [name], male    DOB: 1955-09-28, 57 y.o.   MRN: [id number]  HPI  Here to f/u; overall doing ok,  Pt denies  increased sob or doe, wheezing, orthopnea, PND, increased LE swelling, dizziness or syncope, but has had mild left anterior pleuritic CP for several wks, mild to mod, no radiation or assoc symptoms.  Pt denies polydipsia, polyuria, or low sugar symptoms such as weakness or confusion improved with po intake.  Pt denies new neurological symptoms such as new headache, or facial or extremity weakness or numbness.   Pt states overall good compliance with meds, has been trying to follow lower cholesterol diet, with wt overall stable.  No tobacco.  Has had 3 discrete episodes palpitations he thinks may have been recurrent afib, lasting minutes.  Past Medical History  Diagnosis Date  . HYPOTHYROIDISM, ACQUIRED NEC 09/28/2006  . HYPERLIPIDEMIA 09/28/2006  . ANXIETY 09/28/2006  . OBSTRUCTIVE SLEEP APNEA 01/06/2008  . HYPERTENSION 09/28/2006  . Atrial fibrillation 09/28/2006    s/p afib ablation x 2  . Diastolic dysfunction 09/28/2006  . ALLERGIC RHINITIS 01/14/2007  . CHRONIC OBSTRUCTIVE PULMONARY DISEASE, ACUTE EXACERBATION 10/04/2008  . Long term current use of anticoagulant 04/08/2010  . Right knee DJD 07/09/2010  . Asbestos exposure   . Coronary artery disease 11/24/2011  . S/P CABG x 1 11/26/2011    Right internal mammary artery to right coronary artery  . S/P Maze operation for atrial fibrillation 11/26/2011    complete biatrial lesion set with clipping of LA appendage   Past Surgical History  Procedure Laterality Date  . Hand surgury      left  . Radiofrequency ablation for atrial flutter  2005    CTI  . Radiofrequency ablation for atrial fibrillation  12/30/07 and 06/05/09    afib ablation x 2 by JA  . Maze  11/26/2011    Procedure: MAZE;  Surgeon: Purcell Nails, MD;  Location: [facility] OR;  Service: Open Heart Surgery;  Laterality: N/A;  Cryomaze   . Coronary  artery bypass graft  11/26/2011    Procedure: CORONARY ARTERY BYPASS GRAFTING (CABG);  Surgeon: Purcell Nails, MD;  Location: [facility] OR;  Service: Open Heart Surgery;  Laterality: N/A;  coronary artery bypass on pump times one utilizing the right internal mammary artery, transesophageal echocardiogram     reports that he quit smoking about 17 years ago. His smoking use included Cigarettes. He has a 39 pack-year smoking history. He does not have any smokeless tobacco history on file. He reports that he does not drink alcohol or use illicit drugs. family history includes Atrial fibrillation in his mother; Dementia in his father; Hypertension in his father; and Stroke in his mother. Allergies  Allergen Reactions  . Zolpidem Tartrate Other (See Comments)    Extremely drowsy the next day   Current Outpatient Prescriptions on File Prior to Visit  Medication Sig Dispense Refill  . acetaminophen (TYLENOL) 500 MG tablet Take 500 mg by mouth every 6 (six) hours as needed.      . ALPRAZolam (XANAX) 0.5 MG tablet Take 1 tablet (0.5 mg total) by mouth 2 (two) times daily as needed. For anxiety  60 tablet  2  . aspirin EC 81 MG EC tablet Take 1 tablet (81 mg total) by mouth daily.      . budesonide-formoterol (SYMBICORT) 160-4.5 MCG/ACT inhaler Inhale 2 puffs into the lungs 2 (two) times daily.  3 Inhaler  3  .  citalopram (CELEXA) 20 MG tablet TAKE ONE TABLET BY MOUTH ONE TIME DAILY  90 tablet  1  . enalapril (VASOTEC) 10 MG tablet TAKE ONE TABLET BY MOUTH ONE TIME DAILY  90 tablet  2  . HYDROcodone-acetaminophen (NORCO/VICODIN) 5-325 MG per tablet Take 1 tablet by mouth every 6 (six) hours as needed for pain.  120 tablet  1  . lovastatin (MEVACOR) 40 MG tablet Take 2 tablets (80 mg total) by mouth daily. Take two tablets by mouth once daily  180 tablet  3  . temazepam (RESTORIL) 30 MG capsule Take 1 capsule (30 mg total) by mouth at bedtime as needed. For sleep  30 capsule  2  . warfarin (COUMADIN) 2.5 MG  tablet FOLLOW DIRECTIONS PROVIDED BY PHYSICIAN  90 tablet  1   No current facility-administered medications on file prior to visit.   Review of Systems  Constitutional: Negative for unexpected weight change, or unusual diaphoresis  HENT: Negative for tinnitus.   Eyes: Negative for photophobia and visual disturbance.  Respiratory: Negative for choking and stridor.   Gastrointestinal: Negative for vomiting and blood in stool.  Genitourinary: Negative for hematuria and decreased urine volume.  Musculoskeletal: Negative for acute joint swelling Skin: Negative for color change and wound.  Neurological: Negative for tremors and numbness other than noted  Psychiatric/Behavioral: Negative for decreased concentration or  hyperactivity.       Objective:   Physical Exam BP 150/90  Pulse 71  Temp(Src) 98.4 F (36.9 C) (Oral)  Ht 5\' 6"  (1.676 m)  Wt 180 lb 12.8 oz (82.01 kg)  BMI 29.2 kg/m2  SpO2 97% VS noted,  Constitutional: Pt appears well-developed and well-nourished.  HENT: Head: NCAT.  Right Ear: External ear normal.  Left Ear: External ear normal.  Eyes: Conjunctivae and EOM are normal. Pupils are equal, round, and reactive to light.  Neck: Normal range of motion. Neck supple.  Cardiovascular: Normal rate and regular rhythm.   Pulmonary/Chest: Effort normal and breath sounds without wheezeor rales.  Abd:  Soft, NT, non-distended, + BS Neurological: Pt is alert. Not confused  Skin: Skin is warm. No erythema.  Psychiatric: Pt behavior is normal. Thought content normal.     Assessment & Plan:

## 2012-06-23 NOTE — Assessment & Plan Note (Signed)
Mild elev, for increased toprol XL to 50 qd

## 2012-06-23 NOTE — Assessment & Plan Note (Signed)
Chronic persistent, suspect MSk, doubt GI or pericarditis, ECG reviewed as per emr, also for cxr, cont pain med prn

## 2012-06-23 NOTE — Assessment & Plan Note (Signed)
stable overall by history and exam, recent data reviewed with pt, and pt to continue medical treatment as before,  to f/u any worsening symptoms or concerns SpO2 Readings from Last 3 Encounters:  06/23/12 97%  05/17/12 98%  02/16/12 98%

## 2012-06-23 NOTE — Patient Instructions (Addendum)
Your ECG was OK today Please increase the metoprolol ER to 50 mg per day (a new prescription was sent to your pharmacy, and you can take 2 of the 25 mg pills per day to use them up as well) Please continue all other medications as before, and refills have been done if requested. Please have the pharmacy call with any other refills you may need. Please go to the XRAY Department in the Basement (go straight as you get off the elevator) for the x-ray testing You will be contacted by phone if any changes need to be made immediately.  Otherwise, you will receive a letter about your results with an explanation, but please check with MyChart first. Thank you for enrolling in MyChart. Please follow the instructions below to securely access your online medical record. MyChart allows you to send messages to your doctor, view your test results, renew your prescriptions, schedule appointments, and more. You will be contacted regarding the referral for: cardiology - Dr Johney Frame Please return in 6 months, or sooner if needed

## 2012-07-01 ENCOUNTER — Ambulatory Visit (INDEPENDENT_AMBULATORY_CARE_PROVIDER_SITE_OTHER): Payer: Medicaid Other | Admitting: *Deleted

## 2012-07-01 DIAGNOSIS — I4891 Unspecified atrial fibrillation: Secondary | ICD-10-CM

## 2012-07-01 DIAGNOSIS — I4892 Unspecified atrial flutter: Secondary | ICD-10-CM

## 2012-07-01 DIAGNOSIS — Z7901 Long term (current) use of anticoagulants: Secondary | ICD-10-CM

## 2012-07-05 ENCOUNTER — Encounter (HOSPITAL_COMMUNITY): Payer: Self-pay | Admitting: Pharmacy Technician

## 2012-07-05 ENCOUNTER — Other Ambulatory Visit: Payer: Self-pay | Admitting: Internal Medicine

## 2012-07-05 ENCOUNTER — Ambulatory Visit (INDEPENDENT_AMBULATORY_CARE_PROVIDER_SITE_OTHER): Payer: Medicaid Other | Admitting: Internal Medicine

## 2012-07-05 VITALS — BP 130/72 | HR 80 | Ht 66.0 in | Wt 183.0 lb

## 2012-07-05 DIAGNOSIS — I4891 Unspecified atrial fibrillation: Secondary | ICD-10-CM

## 2012-07-05 DIAGNOSIS — I480 Paroxysmal atrial fibrillation: Secondary | ICD-10-CM

## 2012-07-05 DIAGNOSIS — I10 Essential (primary) hypertension: Secondary | ICD-10-CM

## 2012-07-05 DIAGNOSIS — I2510 Atherosclerotic heart disease of native coronary artery without angina pectoris: Secondary | ICD-10-CM

## 2012-07-05 LAB — PROTIME-INR
INR: 1.5 ratio — ABNORMAL HIGH (ref 0.8–1.0)
Prothrombin Time: 15.7 s — ABNORMAL HIGH (ref 10.2–12.4)

## 2012-07-05 MED ORDER — METOPROLOL SUCCINATE ER 50 MG PO TB24: ORAL_TABLET | ORAL | Status: DC

## 2012-07-05 NOTE — Patient Instructions (Addendum)
Increase Metoprolol to 75mg  (1 and 1/2 tablets daily)  Come to short stay center at [facility] tomorrow at [facility].

## 2012-07-05 NOTE — Assessment & Plan Note (Signed)
Stable No change required today  

## 2012-07-05 NOTE — Progress Notes (Signed)
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 male who presents today for routine electrophysiology followup.  Since last being seen in our clinic, the patient has had several episodes of tachypalpitations.  These are more regular and feel "faster" than his afib. He reports presyncope.  Episodes occur <once per month and last about 30 minutes.  His SOB is unchanged at this point. Today, he denies symptoms of chest pain,  lower extremity edema, dizziness, presyncope, or syncope.  The patient is otherwise without complaint today.   Past Medical History  Diagnosis Date  . HYPOTHYROIDISM, ACQUIRED NEC 09/28/2006  . HYPERLIPIDEMIA 09/28/2006  . ANXIETY 09/28/2006  . OBSTRUCTIVE SLEEP APNEA 01/06/2008  . HYPERTENSION 09/28/2006  . Atrial fibrillation 09/28/2006    s/p afib ablation x 2  . Diastolic dysfunction 09/28/2006  . ALLERGIC RHINITIS 01/14/2007  . CHRONIC OBSTRUCTIVE PULMONARY DISEASE, ACUTE EXACERBATION 10/04/2008  . Long term current use of anticoagulant 04/08/2010  . Right knee DJD 07/09/2010  . Asbestos exposure   . Coronary artery disease 11/24/2011  . S/P CABG x 1 11/26/2011    Right internal mammary artery to right coronary artery  . S/P Maze operation for atrial fibrillation 11/26/2011    complete biatrial lesion set with clipping of LA appendage   Past Surgical History  Procedure Laterality Date  . Hand surgury      left  . Radiofrequency ablation for atrial flutter  2005    CTI  . Radiofrequency ablation for atrial fibrillation  12/30/07 and 06/05/09    afib ablation x 2 by JA  . Maze  11/26/2011    Procedure: MAZE;  Surgeon: Purcell Nails, MD;  Location: [facility] OR;  Service: Open Heart Surgery;  Laterality: N/A;  Cryomaze   . Coronary artery bypass graft  11/26/2011    Procedure: CORONARY ARTERY BYPASS GRAFTING (CABG);  Surgeon: Purcell Nails, MD;  Location: [facility] OR;  Service: Open Heart Surgery;  Laterality: N/A;  coronary artery bypass on pump times  one utilizing the right internal mammary artery, transesophageal echocardiogram     Current Outpatient Prescriptions  Medication Sig Dispense Refill  . acetaminophen (TYLENOL) 500 MG tablet Take 500 mg by mouth every 6 (six) hours as needed.      . ALPRAZolam (XANAX) 0.5 MG tablet Take 1 tablet (0.5 mg total) by mouth 2 (two) times daily as needed. For anxiety  60 tablet  2  . aspirin EC 81 MG EC tablet Take 1 tablet (81 mg total) by mouth daily.      . budesonide-formoterol (SYMBICORT) 160-4.5 MCG/ACT inhaler Inhale 2 puffs into the lungs 2 (two) times daily.  3 Inhaler  3  . citalopram (CELEXA) 20 MG tablet TAKE ONE TABLET BY MOUTH ONE TIME DAILY  90 tablet  1  . enalapril (VASOTEC) 10 MG tablet TAKE ONE TABLET BY MOUTH ONE TIME DAILY  90 tablet  2  . HYDROcodone-acetaminophen (NORCO/VICODIN) 5-325 MG per tablet Take 1 tablet by mouth every 6 (six) hours as needed for pain.  120 tablet  1  . lovastatin (MEVACOR) 40 MG tablet Take 2 tablets (80 mg total) by mouth daily. Take two tablets by mouth once daily  180 tablet  3  . metoprolol succinate (TOPROL-XL) 50 MG 24 hr tablet Take 1 and 1/2 tablets daily.  90 tablet  3  . temazepam (RESTORIL) 30 MG capsule Take 1 capsule (30 mg total) by mouth at bedtime as needed.  For sleep  30 capsule  2  . warfarin (COUMADIN) 2.5 MG tablet FOLLOW DIRECTIONS PROVIDED BY PHYSICIAN  90 tablet  1   No current facility-administered medications for this visit.    Physical Exam: Filed Vitals:   07/05/12 1037  BP: 130/72  Pulse: 80  Height: 5\' 6"  (1.676 m)  Weight: 183 lb (83.008 kg)  SpO2: 96%    GEN- The patient is well appearing, alert and oriented x 3 today.   Head- normocephalic, atraumatic Eyes-  Sclera clear, conjunctiva pink Ears- hearing intact Oropharynx- clear Lungs- Clear to ausculation bilaterally, normal work of breathing Heart- Regular rate and rhythm, no murmurs, rubs or gallops, PMI not laterally displaced GI- soft, NT, ND, +  BS Extremities- no clubbing, cyanosis, or edema  ekg 4/14 is reviewed  Assessment and Plan:

## 2012-07-05 NOTE — Assessment & Plan Note (Signed)
The patient has symptomatic tachypalpitations which he describes as "different" from his afib. I think that ddx includes atch, atrial flutter, VT, or rapidly conducting afib. I think that we need to better understand the arrhythmia.  I also think that given their infrequency that our ability to capture them on event monitor is low.  He will benefit from arrhythmia surveillance with an implantable loop monitor long term. Risks, benefits, and alternatives to implantable loop recorder were discussed at length with the patient who wishes to proceed.

## 2012-07-06 ENCOUNTER — Ambulatory Visit: Payer: Medicaid Other | Admitting: Physician Assistant

## 2012-07-06 ENCOUNTER — Ambulatory Visit (HOSPITAL_COMMUNITY)
Admission: RE | Admit: 2012-07-06 | Discharge: 2012-07-06 | Disposition: A | Discharge status: Home / Self Care | Payer: Medicaid Other | Source: Ambulatory Visit | Attending: Internal Medicine | Admitting: Internal Medicine

## 2012-07-06 ENCOUNTER — Encounter (HOSPITAL_COMMUNITY)
Admission: RE | Disposition: A | Discharge status: Home / Self Care | Payer: Self-pay | Source: Ambulatory Visit | Attending: Internal Medicine

## 2012-07-06 DIAGNOSIS — Z77090 Contact with and (suspected) exposure to asbestos: Secondary | ICD-10-CM | POA: Insufficient documentation

## 2012-07-06 DIAGNOSIS — Z79899 Other long term (current) drug therapy: Secondary | ICD-10-CM | POA: Insufficient documentation

## 2012-07-06 DIAGNOSIS — J309 Allergic rhinitis, unspecified: Secondary | ICD-10-CM | POA: Insufficient documentation

## 2012-07-06 DIAGNOSIS — I4891 Unspecified atrial fibrillation: Secondary | ICD-10-CM

## 2012-07-06 DIAGNOSIS — G4733 Obstructive sleep apnea (adult) (pediatric): Secondary | ICD-10-CM | POA: Insufficient documentation

## 2012-07-06 DIAGNOSIS — I480 Paroxysmal atrial fibrillation: Secondary | ICD-10-CM | POA: Diagnosis present

## 2012-07-06 DIAGNOSIS — I519 Heart disease, unspecified: Secondary | ICD-10-CM | POA: Insufficient documentation

## 2012-07-06 DIAGNOSIS — Z7901 Long term (current) use of anticoagulants: Secondary | ICD-10-CM | POA: Insufficient documentation

## 2012-07-06 DIAGNOSIS — J4489 Other specified chronic obstructive pulmonary disease: Secondary | ICD-10-CM | POA: Insufficient documentation

## 2012-07-06 DIAGNOSIS — E785 Hyperlipidemia, unspecified: Secondary | ICD-10-CM | POA: Insufficient documentation

## 2012-07-06 DIAGNOSIS — Z7982 Long term (current) use of aspirin: Secondary | ICD-10-CM | POA: Insufficient documentation

## 2012-07-06 DIAGNOSIS — I10 Essential (primary) hypertension: Secondary | ICD-10-CM | POA: Insufficient documentation

## 2012-07-06 DIAGNOSIS — E039 Hypothyroidism, unspecified: Secondary | ICD-10-CM | POA: Insufficient documentation

## 2012-07-06 DIAGNOSIS — J449 Chronic obstructive pulmonary disease, unspecified: Secondary | ICD-10-CM | POA: Insufficient documentation

## 2012-07-06 DIAGNOSIS — I2510 Atherosclerotic heart disease of native coronary artery without angina pectoris: Secondary | ICD-10-CM | POA: Insufficient documentation

## 2012-07-06 DIAGNOSIS — R002 Palpitations: Secondary | ICD-10-CM

## 2012-07-06 DIAGNOSIS — R55 Syncope and collapse: Secondary | ICD-10-CM | POA: Insufficient documentation

## 2012-07-06 DIAGNOSIS — Z951 Presence of aortocoronary bypass graft: Secondary | ICD-10-CM | POA: Insufficient documentation

## 2012-07-06 HISTORY — PX: LOOP RECORDER IMPLANT: SHX5477

## 2012-07-06 SURGERY — LOOP RECORDER IMPLANT
Anesthesia: LOCAL

## 2012-07-06 MED ORDER — LIDOCAINE HCL (PF) 1 % IJ SOLN
INTRAMUSCULAR | Status: AC
  Filled 2012-07-06: qty 30

## 2012-07-06 NOTE — H&P (View-Only) (Signed)
PCP: Damiano Stamper John, MD Primary Cardiologist:  Dr [name] is a 57 y.o. male who presents today for routine electrophysiology followup.  Since last being seen in our clinic, the patient has had several episodes of tachypalpitations.  These are more regular and feel "faster" than his afib. He reports presyncope.  Episodes occur <once per month and last about 30 minutes.  His SOB is unchanged at this point. Today, he denies symptoms of chest pain,  lower extremity edema, dizziness, presyncope, or syncope.  The patient is otherwise without complaint today.   Past Medical History  Diagnosis Date  . HYPOTHYROIDISM, ACQUIRED NEC 09/28/2006  . HYPERLIPIDEMIA 09/28/2006  . ANXIETY 09/28/2006  . OBSTRUCTIVE SLEEP APNEA 01/06/2008  . HYPERTENSION 09/28/2006  . Atrial fibrillation 09/28/2006    s/p afib ablation x 2  . Diastolic dysfunction 09/28/2006  . ALLERGIC RHINITIS 01/14/2007  . CHRONIC OBSTRUCTIVE PULMONARY DISEASE, ACUTE EXACERBATION 10/04/2008  . Long term current use of anticoagulant 04/08/2010  . Right knee DJD 07/09/2010  . Asbestos exposure   . Coronary artery disease 11/24/2011  . S/P CABG x 1 11/26/2011    Right internal mammary artery to right coronary artery  . S/P Maze operation for atrial fibrillation 11/26/2011    complete biatrial lesion set with clipping of LA appendage   Past Surgical History  Procedure Laterality Date  . Hand surgury      left  . Radiofrequency ablation for atrial flutter  2005    CTI  . Radiofrequency ablation for atrial fibrillation  12/30/07 and 06/05/09    afib ablation x 2 by JA  . Maze  11/26/2011    Procedure: MAZE;  Surgeon: Clarence H Owen, MD;  Location: [facility] OR;  Service: Open Heart Surgery;  Laterality: N/A;  Cryomaze   . Coronary artery bypass graft  11/26/2011    Procedure: CORONARY ARTERY BYPASS GRAFTING (CABG);  Surgeon: Clarence H Owen, MD;  Location: [facility] OR;  Service: Open Heart Surgery;  Laterality: N/A;  coronary artery bypass on pump times  one utilizing the right internal mammary artery, transesophageal echocardiogram     Current Outpatient Prescriptions  Medication Sig Dispense Refill  . acetaminophen (TYLENOL) 500 MG tablet Take 500 mg by mouth every 6 (six) hours as needed.      . ALPRAZolam (XANAX) 0.5 MG tablet Take 1 tablet (0.5 mg total) by mouth 2 (two) times daily as needed. For anxiety  60 tablet  2  . aspirin EC 81 MG EC tablet Take 1 tablet (81 mg total) by mouth daily.      . budesonide-formoterol (SYMBICORT) 160-4.5 MCG/ACT inhaler Inhale 2 puffs into the lungs 2 (two) times daily.  3 Inhaler  3  . citalopram (CELEXA) 20 MG tablet TAKE ONE TABLET BY MOUTH ONE TIME DAILY  90 tablet  1  . enalapril (VASOTEC) 10 MG tablet TAKE ONE TABLET BY MOUTH ONE TIME DAILY  90 tablet  2  . HYDROcodone-acetaminophen (NORCO/VICODIN) 5-325 MG per tablet Take 1 tablet by mouth every 6 (six) hours as needed for pain.  120 tablet  1  . lovastatin (MEVACOR) 40 MG tablet Take 2 tablets (80 mg total) by mouth daily. Take two tablets by mouth once daily  180 tablet  3  . metoprolol succinate (TOPROL-XL) 50 MG 24 hr tablet Take 1 and 1/2 tablets daily.  90 tablet  3  . temazepam (RESTORIL) 30 MG capsule Take 1 capsule (30 mg total) by mouth at bedtime as needed.   For sleep  30 capsule  2  . warfarin (COUMADIN) 2.5 MG tablet FOLLOW DIRECTIONS PROVIDED BY PHYSICIAN  90 tablet  1   No current facility-administered medications for this visit.    Physical Exam: Filed Vitals:   07/05/12 1037  BP: 130/72  Pulse: 80  Height: 5' 6" (1.676 m)  Weight: 183 lb (83.008 kg)  SpO2: 96%    GEN- The patient is well appearing, alert and oriented x 3 today.   Head- normocephalic, atraumatic Eyes-  Sclera clear, conjunctiva pink Ears- hearing intact Oropharynx- clear Lungs- Clear to ausculation bilaterally, normal work of breathing Heart- Regular rate and rhythm, no murmurs, rubs or gallops, PMI not laterally displaced GI- soft, NT, ND, +  BS Extremities- no clubbing, cyanosis, or edema  ekg 4/14 is reviewed  Assessment and Plan:      

## 2012-07-06 NOTE — Interval H&P Note (Signed)
History and Physical Interval Note:  07/06/2012 2:33 PM  Scott [name]  has presented today for surgery, with the diagnosis of afib  The various methods of treatment have been discussed with the patient and family. After consideration of risks, benefits and other options for treatment, the patient has consented to  Procedure(s): LOOP RECORDER IMPLANT (N/A) as a surgical intervention .  The patient's history has been reviewed, patient examined, no change in status, stable for surgery.  I have reviewed the patient's chart and labs.  Questions were answered to the patient's satisfaction.     [name]

## 2012-07-06 NOTE — Op Note (Signed)
SURGEON:  [name], MD     PREPROCEDURE DIAGNOSIS:  Atrial fibrillation, palpitations    POSTPROCEDURE DIAGNOSIS: Atrial fibrillation, palpitations     PROCEDURES:   1. Implantable loop recorder implantation    INTRODUCTION:  Scott [name] is a 57 y.o. male with a history of paroxysmal atrial fibrillation who has failed multiple AAD including amiodarone.  He is s/p afib ablation x 2 as well as surgical MAZE.  Recently, he has developed increasing tachypalpitations which he describes as "different" and unlike his prior afib.  Though this could be atrial flutter, atrial fibrillation, or atrial tachycardia, I cannot exclude ventricular tachycardia also.  As episodes occur infrequently (< once per month), it is felt that long term monitoring via an implantable loop recorder is necessary at this time. The patient therefore presents today for implantable loop implantation.     DESCRIPTION OF PROCEDURE:  Informed written consent was obtained, and the patient was brought to the electrophysiology lab in a fasting state.  The patient required no sedation for the procedure today.  Mapping over the patient's chest was performed by the EP lab staff to identify the area where electrograms were most prominent for ILR recording.  This area was found to be the left parasternal region over the 3rd-4th intercostal space. The patients left chest was therefore prepped and draped in the usual sterile fashion by the EP lab staff. The skin overlying the left parasternal region was infiltrated with lidocaine for local analgesia.  A 0.5-cm incision was made over the left parasternal region over the 3rd intercostal space.  A subcutaneous ILR pocket was fashioned using a combination of sharp and blunt dissection.  A Medtronic Reveal [place] model [id number] implantable loop recorder was then placed into the pocket  R waves were very prominent and measured 0.42mV.  Steri- Strips and a sterile dressing were then applied.   There were no early apparent complications.     CONCLUSIONS:   1. Successful implantation of a Medtronic Reveal LINQ implantable loop recorder for unexplained palpitations as well as atrial fibrillation monitoring.  2. No early apparent complications.

## 2012-07-16 ENCOUNTER — Encounter: Payer: Self-pay | Admitting: Internal Medicine

## 2012-07-16 ENCOUNTER — Ambulatory Visit (INDEPENDENT_AMBULATORY_CARE_PROVIDER_SITE_OTHER): Payer: Medicaid Other | Admitting: *Deleted

## 2012-07-16 DIAGNOSIS — I480 Paroxysmal atrial fibrillation: Secondary | ICD-10-CM

## 2012-07-16 DIAGNOSIS — I4891 Unspecified atrial fibrillation: Secondary | ICD-10-CM

## 2012-07-16 DIAGNOSIS — R002 Palpitations: Secondary | ICD-10-CM

## 2012-07-16 LAB — PACEMAKER DEVICE OBSERVATION

## 2012-07-16 NOTE — Progress Notes (Signed)
Wound check-ILR in clinic.

## 2012-07-22 ENCOUNTER — Ambulatory Visit (INDEPENDENT_AMBULATORY_CARE_PROVIDER_SITE_OTHER): Payer: Medicaid Other | Admitting: Pharmacist

## 2012-07-22 DIAGNOSIS — I4892 Unspecified atrial flutter: Secondary | ICD-10-CM

## 2012-07-22 DIAGNOSIS — Z7901 Long term (current) use of anticoagulants: Secondary | ICD-10-CM

## 2012-07-22 DIAGNOSIS — I4891 Unspecified atrial fibrillation: Secondary | ICD-10-CM

## 2012-08-06 ENCOUNTER — Institutional Professional Consult (permissible substitution): Payer: Medicaid Other | Admitting: Pulmonary Disease

## 2012-08-09 ENCOUNTER — Other Ambulatory Visit: Payer: Self-pay

## 2012-08-09 MED ORDER — HYDROCODONE-ACETAMINOPHEN 5-325 MG PO TABS: 1.0000 | ORAL_TABLET | Freq: Four times a day (QID) | ORAL | Status: DC | PRN

## 2012-08-09 NOTE — Telephone Encounter (Signed)
Faxed hardcopy to [facility]  

## 2012-08-09 NOTE — Telephone Encounter (Signed)
Done hardcopy to robin  

## 2012-08-12 ENCOUNTER — Ambulatory Visit (INDEPENDENT_AMBULATORY_CARE_PROVIDER_SITE_OTHER): Payer: Medicaid Other | Admitting: *Deleted

## 2012-08-12 DIAGNOSIS — Z7901 Long term (current) use of anticoagulants: Secondary | ICD-10-CM

## 2012-08-12 DIAGNOSIS — I4891 Unspecified atrial fibrillation: Secondary | ICD-10-CM

## 2012-08-12 DIAGNOSIS — I4892 Unspecified atrial flutter: Secondary | ICD-10-CM

## 2012-08-25 ENCOUNTER — Other Ambulatory Visit: Payer: Self-pay

## 2012-08-25 MED ORDER — TEMAZEPAM 30 MG PO CAPS: 30.0000 mg | ORAL_CAPSULE | Freq: Every evening | ORAL | Status: DC | PRN

## 2012-08-25 MED ORDER — ALPRAZOLAM 0.5 MG PO TABS: 0.5000 mg | ORAL_TABLET | Freq: Two times a day (BID) | ORAL | Status: DC | PRN

## 2012-08-25 NOTE — Telephone Encounter (Signed)
Faxed hardcopy to [facility] 

## 2012-08-25 NOTE — Telephone Encounter (Signed)
Done hardcopy to robin  

## 2012-08-26 ENCOUNTER — Ambulatory Visit (INDEPENDENT_AMBULATORY_CARE_PROVIDER_SITE_OTHER): Payer: Medicaid Other | Admitting: *Deleted

## 2012-08-26 DIAGNOSIS — I4891 Unspecified atrial fibrillation: Secondary | ICD-10-CM

## 2012-08-26 DIAGNOSIS — I4892 Unspecified atrial flutter: Secondary | ICD-10-CM

## 2012-08-26 DIAGNOSIS — Z7901 Long term (current) use of anticoagulants: Secondary | ICD-10-CM

## 2012-09-01 ENCOUNTER — Ambulatory Visit (INDEPENDENT_AMBULATORY_CARE_PROVIDER_SITE_OTHER): Payer: Medicaid Other | Admitting: Pulmonary Disease

## 2012-09-01 ENCOUNTER — Encounter: Payer: Self-pay | Admitting: Pulmonary Disease

## 2012-09-01 ENCOUNTER — Ambulatory Visit (INDEPENDENT_AMBULATORY_CARE_PROVIDER_SITE_OTHER)
Admission: RE | Admit: 2012-09-01 | Discharge: 2012-09-01 | Disposition: A | Discharge status: Home / Self Care | Payer: Medicaid Other | Source: Ambulatory Visit | Attending: Pulmonary Disease | Admitting: Pulmonary Disease

## 2012-09-01 VITALS — BP 138/90 | HR 84 | Temp 97.9°F | Ht 66.0 in | Wt 185.0 lb

## 2012-09-01 DIAGNOSIS — R0602 Shortness of breath: Secondary | ICD-10-CM

## 2012-09-01 DIAGNOSIS — R0600 Dyspnea, unspecified: Secondary | ICD-10-CM

## 2012-09-01 DIAGNOSIS — R0609 Other forms of dyspnea: Secondary | ICD-10-CM

## 2012-09-01 DIAGNOSIS — R0989 Other specified symptoms and signs involving the circulatory and respiratory systems: Secondary | ICD-10-CM

## 2012-09-01 NOTE — Assessment & Plan Note (Addendum)
The patient complains of progressive dyspnea on exertion, but currently I do not see anything from a pulmonary standpoint that is concerning.  His PFTs last year are normal except for a mild decrease in diffusion capacity, and his CT only showed emphysematous changes in the apices but no other acute process.  He has not had a recent chest x-ray or PFTs, and I will therefore check these. His lung fields were clear on my exam today, and he is on chronic anticoagulation which would make thromboembolic disease very unlikely.  I will followup on his PFTs and chest x-ray, and if normal, no further pulmonary evaluation is required.  A cardiopulmonary exercise test could be considered.

## 2012-09-01 NOTE — Progress Notes (Signed)
  Subjective:    Patient ID: Scott [name], male    DOB: 06-05-1955, 57 y.o.   MRN: [id number]  HPI The patient is a 57 year old male who I've been asked to see for dyspnea on exertion.  He states that he has had issues with shortness of breath for the last 2 years, but has seen significant worsening over the last one-year.  He has a history of atrial fibrillation and coronary disease, and recently had a loop recorder placed a few months ago.  It is unclear if this had shown anything of significance.  The patient states that he has dyspnea at one half block at a moderate pace on flat ground, but still is able to walk a few miles over a few hours multiple times each week.  He will get winded walking up one flight of stairs, and only on rare occasion bringing groceries in from the car.  He has minimal cough with scant mucus, and often wonders if this is simply post nasal drip.  He denies any significant lower extremity edema, and states that his weight is down 15 pounds over the last few years.  He has no history of thromboembolic disease, and denies any significant pleuritic chest pain.  He does feel that his heart races from time to time.  The patient has a history of tobacco abuse, but has not done so since 1997.  He has been given the diagnosis of COPD, and is currently on symbicort.  However, his pulmonary function studies from last year showed no evidence for airflow obstruction.  He did have a CT chest that showed some emphysematous changes in the apices.   Review of Systems  Constitutional: Positive for unexpected weight change. Negative for fever.  HENT: Positive for ear pain, congestion and dental problem. Negative for nosebleeds, sore throat, rhinorrhea, sneezing, trouble swallowing, postnasal drip and sinus pressure.   Eyes: Negative for redness and itching.  Respiratory: Positive for cough and shortness of breath. Negative for chest tightness and wheezing.   Cardiovascular: Positive for chest  pain and palpitations ( irregular heartbeat). Negative for leg swelling.  Gastrointestinal: Negative for nausea and vomiting.  Genitourinary: Negative for dysuria.  Musculoskeletal: Negative for joint swelling.  Skin: Negative for rash.  Neurological: Negative for headaches.  Hematological: Does not bruise/bleed easily.  Psychiatric/Behavioral: Positive for dysphoric mood. The patient is nervous/anxious.        Objective:   Physical Exam Constitutional:  Well developed, no acute distress  HENT:  Nares patent without discharge, but narrowed.  Oropharynx without exudate, palate and uvula are thick and elongated.   Eyes:  Perrla, eomi, no scleral icterus  Neck:  No JVD, no TMG  Cardiovascular:  Normal rate, regular rhythm, no rubs or gallops.  No murmurs        Intact distal pulses  Pulmonary :  Normal breath sounds, no stridor or respiratory distress   No rales, rhonchi, or wheezing  Abdominal:  Soft, nondistended, bowel sounds present.  No tenderness noted.   Musculoskeletal:  minimal lower extremity edema noted.  Lymph Nodes:  No cervical lymphadenopathy noted  Skin:  No cyanosis noted  Neurologic:  Alert, appropriate, moves all 4 extremities without obvious deficit.         Assessment & Plan:

## 2012-09-01 NOTE — Patient Instructions (Addendum)
I do not see a recent chest xray in computer from hospital, so will check one today. Will schedule you for breathing studies, and will call you with the results once compared to your prior studies.

## 2012-09-09 ENCOUNTER — Ambulatory Visit (INDEPENDENT_AMBULATORY_CARE_PROVIDER_SITE_OTHER): Payer: Medicaid Other | Admitting: *Deleted

## 2012-09-09 DIAGNOSIS — I4891 Unspecified atrial fibrillation: Secondary | ICD-10-CM

## 2012-09-09 DIAGNOSIS — I4892 Unspecified atrial flutter: Secondary | ICD-10-CM

## 2012-09-09 DIAGNOSIS — Z7901 Long term (current) use of anticoagulants: Secondary | ICD-10-CM

## 2012-09-16 ENCOUNTER — Encounter (INDEPENDENT_AMBULATORY_CARE_PROVIDER_SITE_OTHER): Payer: Medicaid Other

## 2012-09-16 DIAGNOSIS — R0989 Other specified symptoms and signs involving the circulatory and respiratory systems: Secondary | ICD-10-CM

## 2012-09-22 ENCOUNTER — Ambulatory Visit (INDEPENDENT_AMBULATORY_CARE_PROVIDER_SITE_OTHER): Payer: Medicaid Other | Admitting: Pulmonary Disease

## 2012-09-22 DIAGNOSIS — J449 Chronic obstructive pulmonary disease, unspecified: Secondary | ICD-10-CM

## 2012-09-22 DIAGNOSIS — R0602 Shortness of breath: Secondary | ICD-10-CM

## 2012-09-22 LAB — PULMONARY FUNCTION TEST

## 2012-09-22 NOTE — Progress Notes (Signed)
PFT done today. 

## 2012-09-23 ENCOUNTER — Ambulatory Visit (INDEPENDENT_AMBULATORY_CARE_PROVIDER_SITE_OTHER): Payer: Medicaid Other | Admitting: *Deleted

## 2012-09-23 DIAGNOSIS — I4891 Unspecified atrial fibrillation: Secondary | ICD-10-CM

## 2012-09-23 DIAGNOSIS — I4892 Unspecified atrial flutter: Secondary | ICD-10-CM

## 2012-09-23 DIAGNOSIS — Z7901 Long term (current) use of anticoagulants: Secondary | ICD-10-CM

## 2012-09-23 LAB — POCT INR: INR: 1.7

## 2012-09-27 ENCOUNTER — Other Ambulatory Visit: Payer: Self-pay | Admitting: Internal Medicine

## 2012-10-04 ENCOUNTER — Other Ambulatory Visit: Payer: Self-pay

## 2012-10-04 MED ORDER — HYDROCODONE-ACETAMINOPHEN 5-325 MG PO TABS: 1.0000 | ORAL_TABLET | Freq: Four times a day (QID) | ORAL | Status: DC | PRN

## 2012-10-04 NOTE — Telephone Encounter (Signed)
Done hardcopy to robin  

## 2012-10-04 NOTE — Telephone Encounter (Signed)
Faxed hardcopy to [facility]  

## 2012-10-07 ENCOUNTER — Ambulatory Visit (INDEPENDENT_AMBULATORY_CARE_PROVIDER_SITE_OTHER): Payer: Medicaid Other | Admitting: *Deleted

## 2012-10-07 DIAGNOSIS — I4892 Unspecified atrial flutter: Secondary | ICD-10-CM

## 2012-10-07 DIAGNOSIS — Z7901 Long term (current) use of anticoagulants: Secondary | ICD-10-CM

## 2012-10-07 DIAGNOSIS — I4891 Unspecified atrial fibrillation: Secondary | ICD-10-CM

## 2012-10-07 LAB — POCT INR: INR: 1.7

## 2012-10-09 ENCOUNTER — Telehealth: Payer: Self-pay | Admitting: Pulmonary Disease

## 2012-10-09 NOTE — Telephone Encounter (Signed)
Let pt know that he has minimal airflow limitation (copd) on his breathing studies.  Would like him to continue on the inhaler as directed.  He does have a very abnormal diffusion capacity, and this is often abnormal with underlying heart disease or elevated pressures in blood vessels in lungs.   I would recommend a cardiac w/u be done to see if this explains your shortness of breath.  Your minimal lung disease does not.

## 2012-10-11 NOTE — Telephone Encounter (Signed)
Results have been explained to patient, pt expressed understanding. Nothing further needed. Dr Larena Sox advise if notes have been sent to pt PCP or Cardiologist. Thanks.

## 2012-10-11 NOTE — Telephone Encounter (Signed)
I have sent results and note to Dr. Johney Frame.

## 2012-10-18 ENCOUNTER — Encounter: Payer: Self-pay | Admitting: Internal Medicine

## 2012-10-18 ENCOUNTER — Ambulatory Visit (INDEPENDENT_AMBULATORY_CARE_PROVIDER_SITE_OTHER): Payer: Medicaid Other | Admitting: Internal Medicine

## 2012-10-18 ENCOUNTER — Ambulatory Visit (INDEPENDENT_AMBULATORY_CARE_PROVIDER_SITE_OTHER): Payer: Medicaid Other | Admitting: *Deleted

## 2012-10-18 VITALS — BP 147/81 | HR 59 | Ht 66.0 in | Wt 186.4 lb

## 2012-10-18 DIAGNOSIS — I480 Paroxysmal atrial fibrillation: Secondary | ICD-10-CM

## 2012-10-18 DIAGNOSIS — I2510 Atherosclerotic heart disease of native coronary artery without angina pectoris: Secondary | ICD-10-CM

## 2012-10-18 DIAGNOSIS — Z7901 Long term (current) use of anticoagulants: Secondary | ICD-10-CM

## 2012-10-18 DIAGNOSIS — I4891 Unspecified atrial fibrillation: Secondary | ICD-10-CM

## 2012-10-18 DIAGNOSIS — I4892 Unspecified atrial flutter: Secondary | ICD-10-CM

## 2012-10-18 DIAGNOSIS — Z8679 Personal history of other diseases of the circulatory system: Secondary | ICD-10-CM

## 2012-10-18 DIAGNOSIS — Z9889 Other specified postprocedural states: Secondary | ICD-10-CM

## 2012-10-18 DIAGNOSIS — R0602 Shortness of breath: Secondary | ICD-10-CM

## 2012-10-18 LAB — PACEMAKER DEVICE OBSERVATION

## 2012-10-18 MED ORDER — METOPROLOL SUCCINATE ER 50 MG PO TB24: 50.0000 mg | ORAL_TABLET | Freq: Every day | ORAL | Status: DC

## 2012-10-18 NOTE — Progress Notes (Signed)
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 male who presents today for routine electrophysiology followup.  Since last being seen in our clinic, the patient has had no further episodes of tachypalpitations.   His SOB is still a major concern.  He was recently seen by Dr Shelle Iron who thinks that further CV testing may be helpful.  Today, he denies symptoms of chest pain,  lower extremity edema, dizziness, presyncope, or syncope.  The patient is otherwise without complaint today.   Past Medical History  Diagnosis Date  . HYPOTHYROIDISM, ACQUIRED NEC 09/28/2006  . HYPERLIPIDEMIA 09/28/2006  . ANXIETY 09/28/2006  . OBSTRUCTIVE SLEEP APNEA 01/06/2008  . HYPERTENSION 09/28/2006  . Atrial fibrillation 09/28/2006    s/p afib ablation x 2  . Diastolic dysfunction 09/28/2006  . ALLERGIC RHINITIS 01/14/2007  . Long term current use of anticoagulant 04/08/2010  . Right knee DJD 07/09/2010  . Asbestos exposure   . Coronary artery disease 11/24/2011  . S/P CABG x 1 11/26/2011    Right internal mammary artery to right coronary artery  . S/P Maze operation for atrial fibrillation 11/26/2011    complete biatrial lesion set with clipping of LA appendage   Past Surgical History  Procedure Laterality Date  . Hand surgury      left  . Radiofrequency ablation for atrial flutter  2005    CTI  . Radiofrequency ablation for atrial fibrillation  12/30/07 and 06/05/09    afib ablation x 2 by JA  . Maze  11/26/2011    Procedure: MAZE;  Surgeon: Purcell Nails, MD;  Location: [facility] OR;  Service: Open Heart Surgery;  Laterality: N/A;  Cryomaze   . Coronary artery bypass graft  11/26/2011    Procedure: CORONARY ARTERY BYPASS GRAFTING (CABG);  Surgeon: Purcell Nails, MD;  Location: [facility] OR;  Service: Open Heart Surgery;  Laterality: N/A;  coronary artery bypass on pump times one utilizing the right internal mammary artery, transesophageal echocardiogram   . Implantable loop recorder  07/06/12   Linq implanted by Dr Johney Frame    Current Outpatient Prescriptions  Medication Sig Dispense Refill  . acetaminophen (TYLENOL) 500 MG tablet Take 500 mg by mouth every 6 (six) hours as needed for pain.       Marland Kitchen ALPRAZolam (XANAX) 0.5 MG tablet Take 1 tablet (0.5 mg total) by mouth 2 (two) times daily as needed. For anxiety  60 tablet  2  . aspirin EC 81 MG EC tablet Take 1 tablet (81 mg total) by mouth daily.      . budesonide-formoterol (SYMBICORT) 160-4.5 MCG/ACT inhaler Inhale 2 puffs into the lungs 2 (two) times daily.  3 Inhaler  3  . citalopram (CELEXA) 20 MG tablet Take one tablet by mouth one time daily  90 tablet  3  . enalapril (VASOTEC) 10 MG tablet Take 10 mg by mouth daily.      Marland Kitchen HYDROcodone-acetaminophen (NORCO/VICODIN) 5-325 MG per tablet Take 1 tablet by mouth every 6 (six) hours as needed for pain.  120 tablet  2  . lovastatin (MEVACOR) 40 MG tablet Take 2 tablets (80 mg total) by mouth daily. Take two tablets by mouth once daily  180 tablet  3  . metoprolol succinate (TOPROL-XL) 50 MG 24 hr tablet Take 75 mg by mouth daily. Take with or immediately following a meal.      . temazepam (RESTORIL) 30 MG capsule Take 1 capsule (30 mg total) by  mouth at bedtime as needed. For sleep  30 capsule  2  . warfarin (COUMADIN) 2.5 MG tablet Take 2.5-5 mg by mouth daily. Take 2.5 mg daily, except on Tuesday take 5 mg       No current facility-administered medications for this visit.    Physical Exam: Filed Vitals:   10/18/12 0945  BP: 147/81  Pulse: 59  Height: 5\' 6"  (1.676 m)  Weight: 186 lb 6.4 oz (84.55 kg)    GEN- The patient is well appearing, alert and oriented x 3 today.   Head- normocephalic, atraumatic Eyes-  Sclera clear, conjunctiva pink Ears- hearing intact Oropharynx- clear Lungs- Clear to ausculation bilaterally, normal work of breathing Heart- Regular rate and rhythm, no murmurs, rubs or gallops, PMI not laterally displaced GI- soft, NT, ND, + BS Extremities- no  clubbing, cyanosis, or edema  Implantable loop recorder transmission reviewed today  Assessment and Plan:  1. afib Well controlled Continue coumadin  2. SOB Unclear etiology Will decrease toprol to 50mg  daily Will order a CPX to better evaluate this  3. HTN Stable No change required today  4. CAD Stable cpx as above  Return to see Dr Ladona Ridgel in 3months I will see in 6 months

## 2012-10-18 NOTE — Patient Instructions (Addendum)
Your physician has recommended that you have a cardiopulmonary stress test (CPX). CPX testing is a non-invasive measurement of heart and lung function. It replaces a traditional treadmill stress test. This type of test provides a tremendous amount of information that relates not only to your present condition but also for future outcomes. This test combines measurements of you ventilation, respiratory gas exchange in the lungs, electrocardiogram (EKG), blood pressure and physical response before, during, and following an exercise protocol.  Your physician has recommended you make the following change in your medication:  1) Decrease Toprol to 50mg  daily  Your physician recommends that you schedule a follow-up appointment in: 3 months with Dr. Ladona Ridgel  Your physician wants you to follow-up in: 6 months with Dr. Johney Frame. You will receive a reminder letter in the mail two months in advance. If you don't receive a letter, please call our office to schedule the follow-up appointment.

## 2012-10-20 ENCOUNTER — Encounter: Payer: Self-pay | Admitting: Pulmonary Disease

## 2012-10-21 ENCOUNTER — Ambulatory Visit (HOSPITAL_COMMUNITY): Payer: Self-pay | Attending: Internal Medicine

## 2012-10-21 DIAGNOSIS — J4489 Other specified chronic obstructive pulmonary disease: Secondary | ICD-10-CM | POA: Insufficient documentation

## 2012-10-21 DIAGNOSIS — R0789 Other chest pain: Secondary | ICD-10-CM | POA: Insufficient documentation

## 2012-10-21 DIAGNOSIS — R0602 Shortness of breath: Secondary | ICD-10-CM

## 2012-10-21 DIAGNOSIS — R0609 Other forms of dyspnea: Secondary | ICD-10-CM | POA: Insufficient documentation

## 2012-10-21 DIAGNOSIS — R209 Unspecified disturbances of skin sensation: Secondary | ICD-10-CM | POA: Insufficient documentation

## 2012-10-21 DIAGNOSIS — J449 Chronic obstructive pulmonary disease, unspecified: Secondary | ICD-10-CM | POA: Insufficient documentation

## 2012-10-21 DIAGNOSIS — R0989 Other specified symptoms and signs involving the circulatory and respiratory systems: Secondary | ICD-10-CM | POA: Insufficient documentation

## 2012-10-24 IMAGING — CR DG CHEST 2V
2 series · 2 of 2 positions shown · non-contrast
Comparison: 02/12/2010

CLINICAL DATA: Precardiac surgery

CHEST - 2 VIEW

[w chest pa]
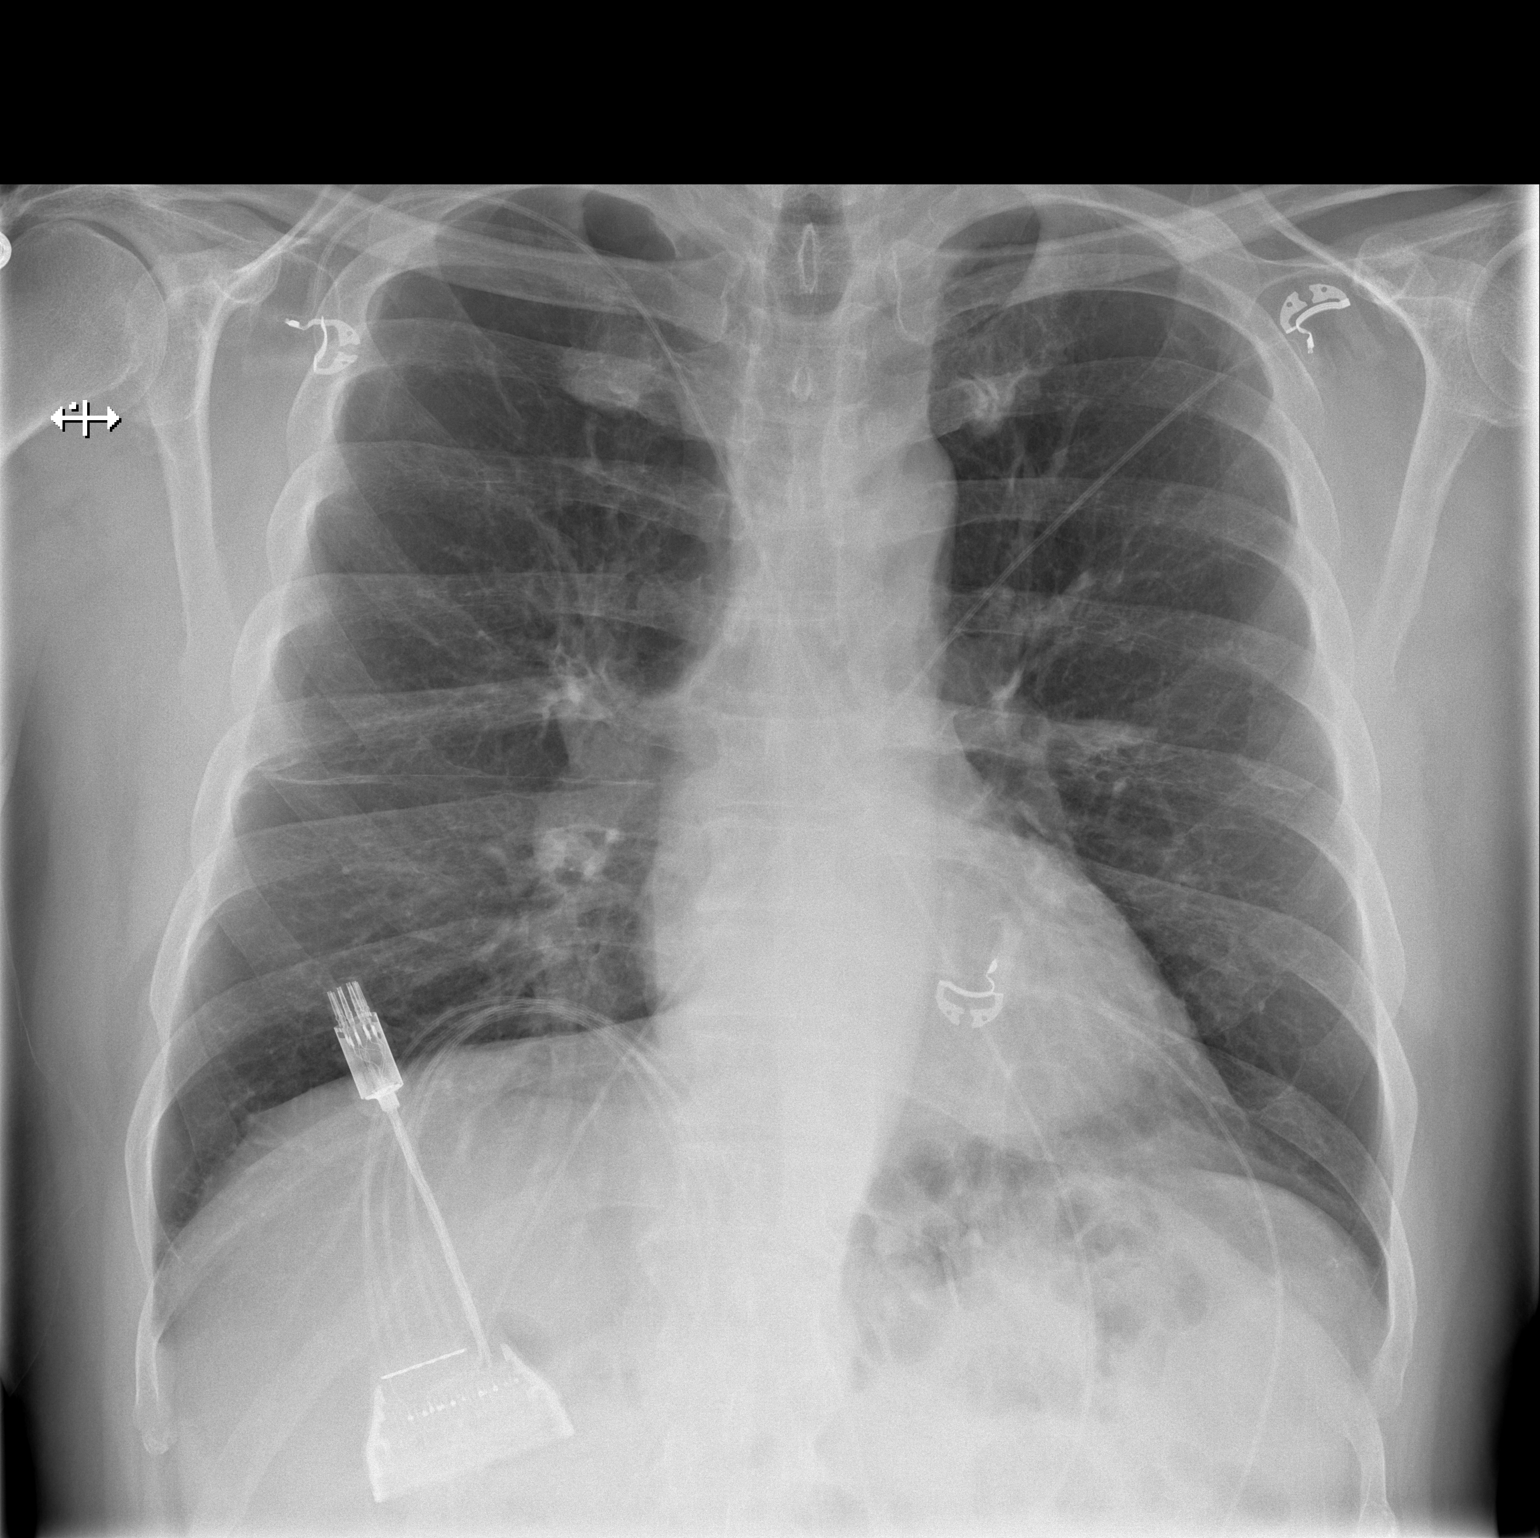

[w chest lat]
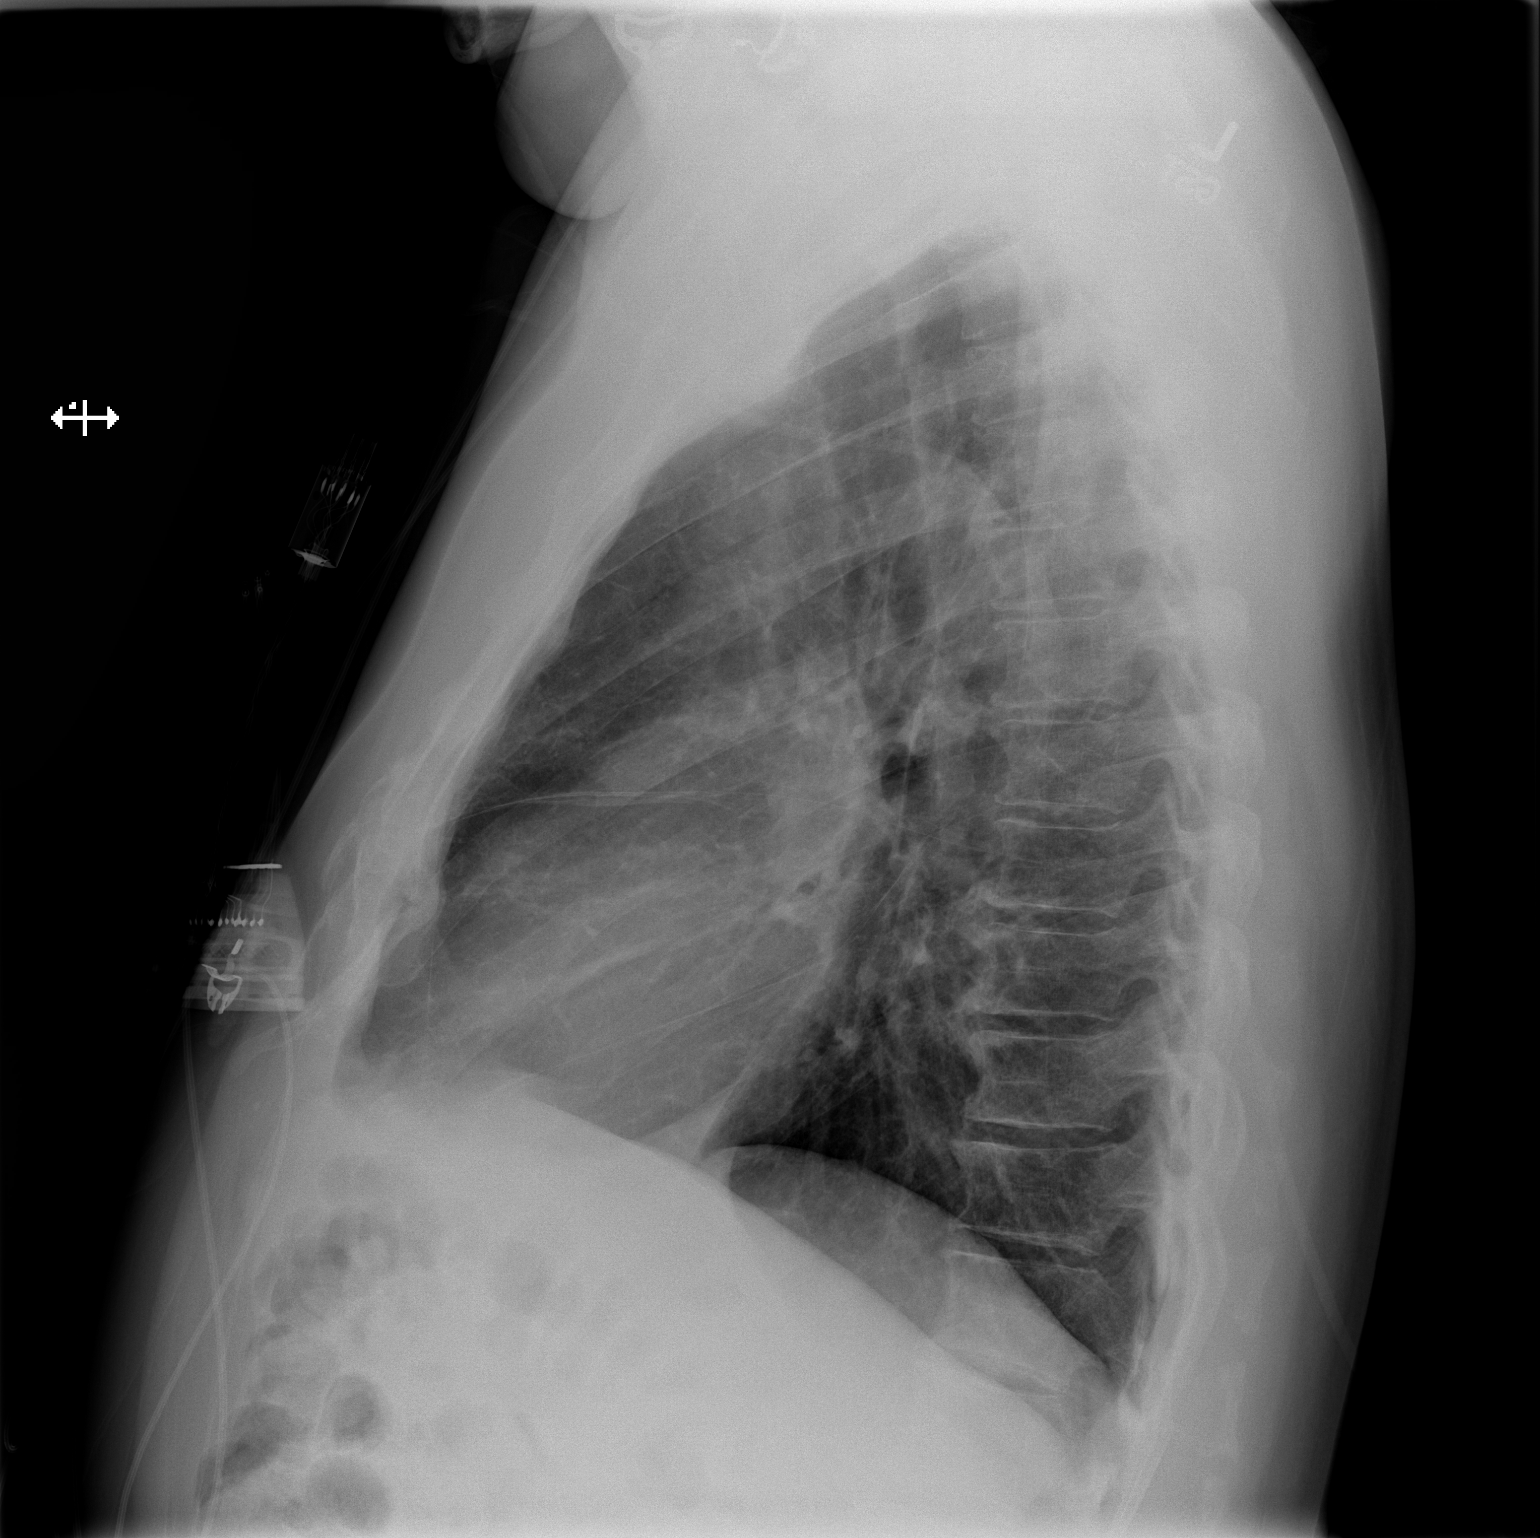

[2 of 2 positions shown; findings below may reference images not displayed]

FINDINGS: Heart size is normal.  No pleural effusion or edema.
Lungs are hyperinflated but clear.  Mild coarsened interstitial
markings noted.
IMPRESSION: 1.  No active cardiopulmonary abnormalities.

## 2012-10-28 ENCOUNTER — Encounter: Payer: Self-pay | Admitting: Internal Medicine

## 2012-10-29 ENCOUNTER — Telehealth: Payer: Self-pay | Admitting: Internal Medicine

## 2012-10-29 ENCOUNTER — Ambulatory Visit (INDEPENDENT_AMBULATORY_CARE_PROVIDER_SITE_OTHER): Payer: Self-pay | Admitting: *Deleted

## 2012-10-29 DIAGNOSIS — I4892 Unspecified atrial flutter: Secondary | ICD-10-CM

## 2012-10-29 DIAGNOSIS — I4891 Unspecified atrial fibrillation: Secondary | ICD-10-CM

## 2012-10-29 DIAGNOSIS — Z7901 Long term (current) use of anticoagulants: Secondary | ICD-10-CM

## 2012-10-29 MED ORDER — TEMAZEPAM 30 MG PO CAPS: 30.0000 mg | ORAL_CAPSULE | Freq: Every evening | ORAL | Status: DC | PRN

## 2012-10-29 MED ORDER — ENALAPRIL MALEATE 10 MG PO TABS: 10.0000 mg | ORAL_TABLET | Freq: Every day | ORAL | Status: DC

## 2012-10-29 MED ORDER — METOPROLOL SUCCINATE ER 50 MG PO TB24: 50.0000 mg | ORAL_TABLET | Freq: Every day | ORAL | Status: DC

## 2012-10-29 MED ORDER — ALPRAZOLAM 0.5 MG PO TABS: 0.5000 mg | ORAL_TABLET | Freq: Two times a day (BID) | ORAL | Status: DC | PRN

## 2012-10-29 NOTE — Telephone Encounter (Signed)
Per pt written request- Done hardcopy to robin

## 2012-10-29 NOTE — Telephone Encounter (Signed)
Called the patient and faxed hardcopy of Alpazolam and temazepam to [facility] pharmacy.

## 2012-11-01 ENCOUNTER — Other Ambulatory Visit: Payer: Self-pay | Admitting: Internal Medicine

## 2012-11-12 ENCOUNTER — Ambulatory Visit (INDEPENDENT_AMBULATORY_CARE_PROVIDER_SITE_OTHER): Payer: Self-pay | Admitting: *Deleted

## 2012-11-12 DIAGNOSIS — I4891 Unspecified atrial fibrillation: Secondary | ICD-10-CM

## 2012-11-12 DIAGNOSIS — R002 Palpitations: Secondary | ICD-10-CM

## 2012-11-12 DIAGNOSIS — Z7901 Long term (current) use of anticoagulants: Secondary | ICD-10-CM

## 2012-11-12 DIAGNOSIS — I4892 Unspecified atrial flutter: Secondary | ICD-10-CM

## 2012-11-12 LAB — POCT INR: INR: 3.4

## 2012-11-15 ENCOUNTER — Ambulatory Visit (INDEPENDENT_AMBULATORY_CARE_PROVIDER_SITE_OTHER): Payer: Self-pay | Admitting: Thoracic Surgery (Cardiothoracic Vascular Surgery)

## 2012-11-15 VITALS — BP 142/84 | HR 58 | Resp 16 | Ht 66.0 in | Wt 180.0 lb

## 2012-11-15 DIAGNOSIS — Z8679 Personal history of other diseases of the circulatory system: Secondary | ICD-10-CM

## 2012-11-15 DIAGNOSIS — Z9889 Other specified postprocedural states: Secondary | ICD-10-CM

## 2012-11-15 DIAGNOSIS — Z951 Presence of aortocoronary bypass graft: Secondary | ICD-10-CM

## 2012-11-15 NOTE — Progress Notes (Signed)
[address]       [name] [address]             [id number]     CARDIOTHORACIC SURGERY OFFICE NOTE  Referring Provider is [name], MD PCP is Oliver Barre, MD   HPI:  Patient returns for followup nearly one-year status post Maze procedure with single-vessel coronary artery bypass grafting.  He was last seen here in our office on 05/17/2012. He continues to followup regularly with Dr. Johney Frame. The last 3 readings from his implanted loop recorder are notable for the absence of any episodes of tachycardia or atrial fibrillation. The patient states that he is doing quite well and he has not had any recurrent tachypalpitations. He does still complain of exertional shortness of breath. This is not associated with any chest discomfort but occasionally is associated with numbness radiating down his left arm. He states that these symptoms are nowhere near as bad as they were prior to surgery and in fact his exercise tolerance is considerably better than it was a year ago. He recently underwent a CPX cardiopulmonary exercise test that was felt to be most suggestive of some type of mechanical ventilatory limitation to exercise. However, his most recent pulmonary function tests demonstrate fairly well compensated spirometry and the patient has not been smoking for quite some time. Chest x-rays have been normal as well and notable for the absence of any sign of diaphragmatic dysfunction.   Current Outpatient Prescriptions  Medication Sig Dispense Refill  . acetaminophen (TYLENOL) 500 MG tablet Take 500 mg by mouth every 6 (six) hours as needed for pain.       Marland Kitchen ALPRAZolam (XANAX) 0.5 MG tablet Take 1 tablet (0.5 mg total) by mouth 2 (two) times daily as needed. For anxiety  180 tablet  1  . aspirin EC 81 MG EC tablet Take 1 tablet (81 mg total) by mouth daily.      . budesonide-formoterol (SYMBICORT) 160-4.5 MCG/ACT inhaler Inhale 2 puffs into the lungs 2 (two) times daily.  3  Inhaler  3  . citalopram (CELEXA) 20 MG tablet Take one tablet by mouth one time daily  90 tablet  3  . enalapril (VASOTEC) 10 MG tablet Take 1 tablet (10 mg total) by mouth daily.  90 tablet  3  . HYDROcodone-acetaminophen (NORCO/VICODIN) 5-325 MG per tablet Take 1 tablet by mouth every 6 (six) hours as needed for pain.  120 tablet  2  . lovastatin (MEVACOR) 40 MG tablet Take 2 tablets (80 mg total) by mouth daily. Take two tablets by mouth once daily  180 tablet  3  . metoprolol succinate (TOPROL-XL) 50 MG 24 hr tablet Take 1 tablet (50 mg total) by mouth daily. Take with or immediately following a meal.  90 tablet  3  . temazepam (RESTORIL) 30 MG capsule Take 1 capsule (30 mg total) by mouth at bedtime as needed. For sleep  90 capsule  1  . warfarin (COUMADIN) 2.5 MG tablet Follow directions provided by physician  60 tablet  3   No current facility-administered medications for this visit.      Physical Exam:   BP 142/84  Pulse 58  Resp 16  Ht 5\' 6"  (1.676 m)  Wt 180 lb (81.647 kg)  BMI 29.07 kg/m2  SpO2 99%  General:  Well-appearing  Chest:   Clear to auscultation with symmetrical breath sounds  CV:   Regular rate and rhythm without murmur  Incisions:  Well-healed, sternum is stable  Abdomen:  Soft nontender  Extremities:  Warm and well-perfused  Diagnostic Tests:  2 channel telemetry rhythm strip demonstrates normal sinus rhythm   Impression:  The patient is clinically doing quite well nearly 1 year following Maze procedure with single-vessel coronary artery bypass grafting. He is maintaining sinus rhythm. He does still report some exertional shortness of breath with occasional episodes of numbness radiating down his left arm.  Plan:  Given the history of coronary artery disease it might be reasonable to consider a radionucleotide stress test and/or followup diagnostic cardiac catheterization. However, the patient seems to be doing well clinically and in fact notes that he  feels much better than he did 1 year ago prior to surgery. We will defer any plans for further diagnostic workup and/or changes in his medications to Dr. Johney Frame and colleagues.  At some point it might be reasonable to consider stopping Coumadin.  We will have him return in one years time for routine followup and rhythm check.   [name]. Cornelius Moras, MD 11/15/2012 1:27 PM

## 2012-11-20 LAB — PACEMAKER DEVICE OBSERVATION

## 2012-11-26 ENCOUNTER — Ambulatory Visit (INDEPENDENT_AMBULATORY_CARE_PROVIDER_SITE_OTHER): Payer: Self-pay | Admitting: *Deleted

## 2012-11-26 DIAGNOSIS — I4891 Unspecified atrial fibrillation: Secondary | ICD-10-CM

## 2012-11-26 DIAGNOSIS — I4892 Unspecified atrial flutter: Secondary | ICD-10-CM

## 2012-11-26 DIAGNOSIS — Z7901 Long term (current) use of anticoagulants: Secondary | ICD-10-CM

## 2012-11-26 LAB — POCT INR: INR: 2.8

## 2012-11-27 ENCOUNTER — Encounter: Payer: Self-pay | Admitting: Internal Medicine

## 2012-12-13 ENCOUNTER — Ambulatory Visit (INDEPENDENT_AMBULATORY_CARE_PROVIDER_SITE_OTHER): Payer: Self-pay | Admitting: *Deleted

## 2012-12-13 DIAGNOSIS — R002 Palpitations: Secondary | ICD-10-CM

## 2012-12-17 ENCOUNTER — Other Ambulatory Visit: Payer: Self-pay | Admitting: Internal Medicine

## 2012-12-21 ENCOUNTER — Ambulatory Visit (INDEPENDENT_AMBULATORY_CARE_PROVIDER_SITE_OTHER): Payer: Self-pay | Admitting: Pharmacist

## 2012-12-21 DIAGNOSIS — Z7901 Long term (current) use of anticoagulants: Secondary | ICD-10-CM

## 2012-12-21 DIAGNOSIS — I4891 Unspecified atrial fibrillation: Secondary | ICD-10-CM

## 2012-12-21 DIAGNOSIS — I4892 Unspecified atrial flutter: Secondary | ICD-10-CM

## 2012-12-21 LAB — POCT INR: INR: 2.8

## 2012-12-30 ENCOUNTER — Other Ambulatory Visit: Payer: Self-pay

## 2012-12-30 NOTE — Progress Notes (Signed)
Remote ILR received  

## 2012-12-31 ENCOUNTER — Encounter: Payer: Self-pay | Admitting: *Deleted

## 2013-01-10 ENCOUNTER — Encounter: Payer: Self-pay | Admitting: Internal Medicine

## 2013-01-14 ENCOUNTER — Ambulatory Visit (INDEPENDENT_AMBULATORY_CARE_PROVIDER_SITE_OTHER): Payer: Self-pay | Admitting: *Deleted

## 2013-01-14 DIAGNOSIS — I480 Paroxysmal atrial fibrillation: Secondary | ICD-10-CM

## 2013-01-14 DIAGNOSIS — I4891 Unspecified atrial fibrillation: Secondary | ICD-10-CM

## 2013-01-17 ENCOUNTER — Encounter: Payer: Self-pay | Admitting: Internal Medicine

## 2013-01-18 ENCOUNTER — Ambulatory Visit (INDEPENDENT_AMBULATORY_CARE_PROVIDER_SITE_OTHER): Payer: Self-pay | Admitting: General Practice

## 2013-01-18 DIAGNOSIS — I4892 Unspecified atrial flutter: Secondary | ICD-10-CM

## 2013-01-18 DIAGNOSIS — Z7901 Long term (current) use of anticoagulants: Secondary | ICD-10-CM

## 2013-01-18 DIAGNOSIS — I4891 Unspecified atrial fibrillation: Secondary | ICD-10-CM

## 2013-01-23 LAB — MDC_IDC_ENUM_SESS_TYPE_REMOTE

## 2013-01-25 ENCOUNTER — Ambulatory Visit (INDEPENDENT_AMBULATORY_CARE_PROVIDER_SITE_OTHER): Payer: Self-pay | Admitting: Internal Medicine

## 2013-01-25 ENCOUNTER — Encounter: Payer: Self-pay | Admitting: Internal Medicine

## 2013-01-25 VITALS — BP 154/90 | HR 75 | Ht 66.0 in | Wt 196.0 lb

## 2013-01-25 DIAGNOSIS — I10 Essential (primary) hypertension: Secondary | ICD-10-CM

## 2013-01-25 DIAGNOSIS — I4891 Unspecified atrial fibrillation: Secondary | ICD-10-CM

## 2013-01-25 DIAGNOSIS — I480 Paroxysmal atrial fibrillation: Secondary | ICD-10-CM

## 2013-01-25 DIAGNOSIS — R002 Palpitations: Secondary | ICD-10-CM

## 2013-01-25 DIAGNOSIS — R0602 Shortness of breath: Secondary | ICD-10-CM

## 2013-01-25 DIAGNOSIS — I495 Sick sinus syndrome: Secondary | ICD-10-CM

## 2013-01-25 LAB — MDC_IDC_ENUM_SESS_TYPE_INCLINIC

## 2013-01-25 MED ORDER — FUROSEMIDE 20 MG PO TABS: 20.0000 mg | ORAL_TABLET | Freq: Every day | ORAL | Status: DC

## 2013-01-25 NOTE — Assessment & Plan Note (Signed)
He is maintaining sinus rhythm very nicely. He'll continue his current medical therapy. 

## 2013-01-25 NOTE — Assessment & Plan Note (Signed)
His blood pressure is elevated today, but at home, and while exercising, his pressures have been well controlled. I've encouraged the patient to reduce his salt intake. He'll continue his current medical therapy.

## 2013-01-25 NOTE — Patient Instructions (Addendum)
Your home monitor will continue to transmit automatically at least once a month. Please record symptoms as your have them.  Your physician recommends that you schedule a follow-up appointment in: 6 months with Dr.Taylor  Your physician has recommended you make the following change in your medication:  1) Start Furosemide 20mg  daily

## 2013-01-25 NOTE — Progress Notes (Signed)
HPI [name] returns today after a long absence from our arrhythmia clinic. He is a very pleasant middle-age man with a history of atrial fibrillation and flutter, status post multiple ablations, who also has coronary disease, and underwent single-vessel bypass and maze surgery just a year ago. In the interim he has been stable except he does have dyspnea with exertion. No he denies chest pain. No peripheral edema. His weight has not changed. He is trying to lose weight. Allergies  Allergen Reactions  . Zolpidem Tartrate Other (See Comments)    Extremely drowsy the next day     Current Outpatient Prescriptions  Medication Sig Dispense Refill  . acetaminophen (TYLENOL) 500 MG tablet Take 500 mg by mouth every 6 (six) hours as needed for pain.       Marland Kitchen ALPRAZolam (XANAX) 0.5 MG tablet Take 1 tablet (0.5 mg total) by mouth 2 (two) times daily as needed. For anxiety  180 tablet  1  . aspirin EC 81 MG EC tablet Take 1 tablet (81 mg total) by mouth daily.      . budesonide-formoterol (SYMBICORT) 160-4.5 MCG/ACT inhaler Inhale 2 puffs into the lungs 2 (two) times daily.  3 Inhaler  3  . citalopram (CELEXA) 20 MG tablet Take one tablet by mouth one time daily  90 tablet  3  . enalapril (VASOTEC) 10 MG tablet Take 1 tablet (10 mg total) by mouth daily.  90 tablet  3  . HYDROcodone-acetaminophen (NORCO/VICODIN) 5-325 MG per tablet Take 1 tablet by mouth every 6 (six) hours as needed for pain.  120 tablet  2  . lovastatin (MEVACOR) 40 MG tablet TAKE 1 TABLET BY MOUTH TWICE DAILY  360 tablet  1  . metoprolol succinate (TOPROL-XL) 50 MG 24 hr tablet Take 1 tablet (50 mg total) by mouth daily. Take with or immediately following a meal.  90 tablet  3  . temazepam (RESTORIL) 30 MG capsule Take 1 capsule (30 mg total) by mouth at bedtime as needed. For sleep  90 capsule  1  . warfarin (COUMADIN) 2.5 MG tablet Follow directions provided by physician  60 tablet  3   No current facility-administered  medications for this visit.     Past Medical History  Diagnosis Date  . HYPOTHYROIDISM, ACQUIRED NEC 09/28/2006  . HYPERLIPIDEMIA 09/28/2006  . ANXIETY 09/28/2006  . OBSTRUCTIVE SLEEP APNEA 01/06/2008  . HYPERTENSION 09/28/2006  . Atrial fibrillation 09/28/2006    s/p afib ablation x 2  . Diastolic dysfunction 09/28/2006  . ALLERGIC RHINITIS 01/14/2007  . Long term current use of anticoagulant 04/08/2010  . Right knee DJD 07/09/2010  . Asbestos exposure   . Coronary artery disease 11/24/2011  . S/P CABG x 1 11/26/2011    Right internal mammary artery to right coronary artery  . S/P Maze operation for atrial fibrillation 11/26/2011    complete biatrial lesion set with clipping of LA appendage    ROS:   All systems reviewed and negative except as noted in the HPI.   Past Surgical History  Procedure Laterality Date  . Hand surgury      left  . Radiofrequency ablation for atrial flutter  2005    CTI  . Radiofrequency ablation for atrial fibrillation  12/30/07 and 06/05/09    afib ablation x 2 by JA  . Maze  11/26/2011    Procedure: MAZE;  Surgeon: Purcell Nails, MD;  Location: [facility] OR;  Service: Open Heart Surgery;  Laterality:  N/A;  Cryomaze   . Coronary artery bypass graft  11/26/2011    Procedure: CORONARY ARTERY BYPASS GRAFTING (CABG);  Surgeon: Purcell Nails, MD;  Location: [facility] OR;  Service: Open Heart Surgery;  Laterality: N/A;  coronary artery bypass on pump times one utilizing the right internal mammary artery, transesophageal echocardiogram   . Implantable loop recorder  07/06/12    Linq implanted by Dr Johney Frame     Family History  Problem Relation Age of Onset  . Dementia Father   . Hypertension Father   . Atrial fibrillation Mother   . Stroke Mother      History   Social History  . Marital Status: Single    Spouse Name: N/A    Number of Children: N/A  . Years of Education: N/A   Occupational History  . worked previously in the [employer] and feels that he was exposed  to asbestos.    Social History Main Topics  . Smoking status: Former Smoker -- 1.50 packs/day for 26 years    Types: Cigarettes    Quit date: 04/06/1995  . Smokeless tobacco: Not on file     Comment: not smoked over 10 years  . Alcohol Use: No  . Drug Use: No  . Sexual Activity: Not Currently   Other Topics Concern  . Not on file   Social History Narrative   Pt lives in [place], [place] with his friend.      There were no vitals taken for this visit.  Physical Exam:  Well appearing NAD HEENT: Unremarkable Neck:  No JVD, no thyromegally Lymphatics:  No adenopathy Back:  No CVA tenderness Lungs:  Clear HEART:  Regular rate rhythm, no murmurs, no rubs, no clicks Abd:  soft, positive bowel sounds, no organomegally, no rebound, no guarding Ext:  2 plus pulses, no edema, no cyanosis, no clubbing Skin:  No rashes no nodules Neuro:  CN II through XII intact, motor grossly intact   DEVICE  Normal device function.  See PaceArt for details. Sinus rhythm  Assess/Plan:

## 2013-01-25 NOTE — Assessment & Plan Note (Signed)
I suspect he has multiple etiologies of his dyspnea. We'll give him a prescription for Lasix today. I've instructed him to take 20 mg daily for a week, and see if his symptoms improve.

## 2013-02-15 ENCOUNTER — Ambulatory Visit (INDEPENDENT_AMBULATORY_CARE_PROVIDER_SITE_OTHER): Payer: Self-pay | Admitting: *Deleted

## 2013-02-15 ENCOUNTER — Encounter: Payer: Self-pay | Admitting: *Deleted

## 2013-02-15 DIAGNOSIS — Z7901 Long term (current) use of anticoagulants: Secondary | ICD-10-CM

## 2013-02-15 DIAGNOSIS — I4891 Unspecified atrial fibrillation: Secondary | ICD-10-CM

## 2013-02-15 DIAGNOSIS — I4892 Unspecified atrial flutter: Secondary | ICD-10-CM

## 2013-02-15 LAB — POCT INR: INR: 3.6

## 2013-02-21 ENCOUNTER — Encounter: Payer: Self-pay | Admitting: Internal Medicine

## 2013-02-22 ENCOUNTER — Telehealth: Payer: Self-pay | Admitting: Internal Medicine

## 2013-02-22 MED ORDER — HYDROCODONE-ACETAMINOPHEN 5-325 MG PO TABS: 1.0000 | ORAL_TABLET | Freq: Four times a day (QID) | ORAL | Status: DC | PRN

## 2013-02-22 NOTE — Telephone Encounter (Signed)
Done hardcopy to robin but also to let pt know  You are given the letter today explaining the transitional pain medication refill policy due to change in recent [place] law, and [place] [facility] regulations  Please be aware that I will no longer be able to offer monthly refills of any Schedule II or higher medication starting Mar 27, 2013

## 2013-02-22 NOTE — Telephone Encounter (Signed)
02/22/2013 pt would like to have a refill on rx : HYDROcodone-acetaminophen (NORCO/VICODIN) 5-325 MG

## 2013-02-22 NOTE — Telephone Encounter (Signed)
Called the patient informed to pickup hardcopy at the front desk.  Also explained enclosed letter regarding pain medication change in refill policy.

## 2013-03-09 ENCOUNTER — Ambulatory Visit (INDEPENDENT_AMBULATORY_CARE_PROVIDER_SITE_OTHER): Payer: Self-pay | Admitting: *Deleted

## 2013-03-09 DIAGNOSIS — I4891 Unspecified atrial fibrillation: Secondary | ICD-10-CM

## 2013-03-09 DIAGNOSIS — I4892 Unspecified atrial flutter: Secondary | ICD-10-CM

## 2013-03-09 DIAGNOSIS — Z7901 Long term (current) use of anticoagulants: Secondary | ICD-10-CM

## 2013-03-09 LAB — POCT INR: INR: 3.1

## 2013-03-17 ENCOUNTER — Ambulatory Visit (INDEPENDENT_AMBULATORY_CARE_PROVIDER_SITE_OTHER): Payer: Self-pay | Admitting: *Deleted

## 2013-03-17 ENCOUNTER — Encounter: Payer: Self-pay | Admitting: Internal Medicine

## 2013-03-17 DIAGNOSIS — R002 Palpitations: Secondary | ICD-10-CM

## 2013-03-17 DIAGNOSIS — I4891 Unspecified atrial fibrillation: Secondary | ICD-10-CM

## 2013-03-17 DIAGNOSIS — I480 Paroxysmal atrial fibrillation: Secondary | ICD-10-CM

## 2013-03-19 ENCOUNTER — Other Ambulatory Visit: Payer: Self-pay | Admitting: Internal Medicine

## 2013-03-30 ENCOUNTER — Ambulatory Visit (INDEPENDENT_AMBULATORY_CARE_PROVIDER_SITE_OTHER): Payer: Self-pay | Admitting: Pharmacist

## 2013-03-30 DIAGNOSIS — I4891 Unspecified atrial fibrillation: Secondary | ICD-10-CM

## 2013-03-30 DIAGNOSIS — Z5181 Encounter for therapeutic drug level monitoring: Secondary | ICD-10-CM | POA: Insufficient documentation

## 2013-03-30 DIAGNOSIS — I4892 Unspecified atrial flutter: Secondary | ICD-10-CM

## 2013-03-30 DIAGNOSIS — Z7901 Long term (current) use of anticoagulants: Secondary | ICD-10-CM

## 2013-03-30 LAB — POCT INR: INR: 2.1

## 2013-04-13 ENCOUNTER — Ambulatory Visit (INDEPENDENT_AMBULATORY_CARE_PROVIDER_SITE_OTHER): Payer: Self-pay | Admitting: Internal Medicine

## 2013-04-13 ENCOUNTER — Encounter: Payer: Self-pay | Admitting: Internal Medicine

## 2013-04-13 VITALS — BP 122/84 | HR 85 | Temp 98.4°F | Ht 66.0 in | Wt 192.4 lb

## 2013-04-13 DIAGNOSIS — F411 Generalized anxiety disorder: Secondary | ICD-10-CM

## 2013-04-13 DIAGNOSIS — J209 Acute bronchitis, unspecified: Secondary | ICD-10-CM

## 2013-04-13 DIAGNOSIS — I10 Essential (primary) hypertension: Secondary | ICD-10-CM

## 2013-04-13 MED ORDER — HYDROCODONE-HOMATROPINE 5-1.5 MG/5ML PO SYRP: 5.0000 mL | ORAL_SOLUTION | Freq: Four times a day (QID) | ORAL | Status: DC | PRN

## 2013-04-13 MED ORDER — TRAMADOL HCL 50 MG PO TABS: 50.0000 mg | ORAL_TABLET | Freq: Three times a day (TID) | ORAL | Status: DC | PRN

## 2013-04-13 MED ORDER — AZITHROMYCIN 250 MG PO TABS: ORAL_TABLET | ORAL | Status: DC

## 2013-04-13 NOTE — Progress Notes (Signed)
Pre-visit discussion using our clinic review tool. No additional management support is needed unless otherwise documented below in the visit note.  

## 2013-04-13 NOTE — Assessment & Plan Note (Signed)
Mild to mod, for antibx course,  to f/u any worsening symptoms or concerns 

## 2013-04-13 NOTE — Patient Instructions (Addendum)
Please take all new medication as prescribed  - the antibiotic, and cough medicine (both ok to take with the blood thinner)  OK to stop the hydrocodone Please take all new medication as prescribed - the tramadol instead  Please continue all other medications as before  Please have the pharmacy call with any other refills you may need.  Please keep your appointments with your specialists as you have planned  Please return in 3 months, or sooner if needed (the office will call)

## 2013-04-13 NOTE — Assessment & Plan Note (Signed)
stable overall by history and exam, and pt to continue medical treatment as before,  to f/u any worsening symptoms or concerns 

## 2013-04-13 NOTE — Assessment & Plan Note (Signed)
stable overall by history and exam, recent data reviewed with pt, and pt to continue medical treatment as before,  to f/u any worsening symptoms or concerns BP Readings from Last 3 Encounters:  04/13/13 122/84  01/25/13 154/90  11/15/12 142/84

## 2013-04-13 NOTE — Progress Notes (Signed)
Subjective:    Patient ID: Scott [name], male    DOB: [birth year], 58 y.o.   MRN: [id number]  HPI  Here with acute onset mild to mod 2-3 days ST, HA, general weakness and malaise, with prod cough greenish sputum, but Pt denies chest pain, increased sob or doe, wheezing, orthopnea, PND, increased LE swelling, palpitations, dizziness or syncope.  Asks for change hydrocodone to tramadol as feels he can do ok with intermittent pain on the tramadol.  Sees cardiology on regular basis.  Denies worsening depressive symptoms, suicidal ideation, or panic.  Pt denies new neurological symptoms such as new headache, or facial or extremity weakness or numbness  Past Medical History  Diagnosis Date  . HYPOTHYROIDISM, ACQUIRED NEC 09/28/2006  . HYPERLIPIDEMIA 09/28/2006  . ANXIETY 09/28/2006  . OBSTRUCTIVE SLEEP APNEA 01/06/2008  . HYPERTENSION 09/28/2006  . Atrial fibrillation 09/28/2006    s/p afib ablation x 2  . Diastolic dysfunction 2/0/2542  . ALLERGIC RHINITIS 01/14/2007  . Long term current use of anticoagulant 04/08/2010  . Right knee DJD 07/09/2010  . Asbestos exposure   . Coronary artery disease 11/24/2011  . S/P CABG x 1 11/26/2011    Right internal mammary artery to right coronary artery  . S/P Maze operation for atrial fibrillation 11/26/2011    complete biatrial lesion set with clipping of LA appendage   Past Surgical History  Procedure Laterality Date  . Hand surgury      left  . Radiofrequency ablation for atrial flutter  2005    CTI  . Radiofrequency ablation for atrial fibrillation  12/30/07 and 06/05/09    afib ablation x 2 by JA  . Maze  11/26/2011    Procedure: MAZE;  Surgeon: Rexene Alberts, MD;  Location: [place];  Service: Open Heart Surgery;  Laterality: N/A;  Cryomaze   . Coronary artery bypass graft  11/26/2011    Procedure: CORONARY ARTERY BYPASS GRAFTING (CABG);  Surgeon: Rexene Alberts, MD;  Location: [place];  Service: Open Heart Surgery;  Laterality: N/A;  coronary artery bypass  on pump times one utilizing the right internal mammary artery, transesophageal echocardiogram   . Implantable loop recorder  07/06/12    Linq implanted by Dr Rayann Heman    reports that he quit smoking about 18 years ago. His smoking use included Cigarettes. He has a 39 pack-year smoking history. He does not have any smokeless tobacco history on file. He reports that he does not drink alcohol or use illicit drugs. family history includes Atrial fibrillation in his mother; Dementia in his father; Hypertension in his father; Stroke in his mother. Allergies  Allergen Reactions  . Zolpidem Tartrate Other (See Comments)    Extremely drowsy the next day   Current Outpatient Prescriptions on File Prior to Visit  Medication Sig Dispense Refill  . acetaminophen (TYLENOL) 500 MG tablet Take 500 mg by mouth every 6 (six) hours as needed for pain.       Marland Kitchen ALPRAZolam (XANAX) 0.5 MG tablet Take 1 tablet (0.5 mg total) by mouth 2 (two) times daily as needed. For anxiety  180 tablet  1  . aspirin EC 81 MG EC tablet Take 1 tablet (81 mg total) by mouth daily.      . budesonide-formoterol (SYMBICORT) 160-4.5 MCG/ACT inhaler Inhale 2 puffs into the lungs 2 (two) times daily.  3 Inhaler  3  . citalopram (CELEXA) 20 MG tablet Take one tablet by mouth one time daily  90 tablet  3  . enalapril (VASOTEC) 10 MG tablet Take 1 tablet (10 mg total) by mouth daily.  90 tablet  3  . furosemide (LASIX) 20 MG tablet Take 1 tablet (20 mg total) by mouth daily.  90 tablet  3  . lovastatin (MEVACOR) 40 MG tablet TAKE 1 TABLET BY MOUTH TWICE DAILY  360 tablet  1  . metoprolol succinate (TOPROL-XL) 50 MG 24 hr tablet Take 1 tablet (50 mg total) by mouth daily. Take with or immediately following a meal.  90 tablet  3  . temazepam (RESTORIL) 30 MG capsule Take 1 capsule (30 mg total) by mouth at bedtime as needed. For sleep  90 capsule  1  . warfarin (COUMADIN) 2.5 MG tablet Take as directed  60 tablet  2   No current  facility-administered medications on file prior to visit.   Review of Systems  Constitutional: Negative for unexpected weight change, or unusual diaphoresis  HENT: Negative for tinnitus.   Eyes: Negative for photophobia and visual disturbance.  Respiratory: Negative for choking and stridor.   Gastrointestinal: Negative for vomiting and blood in stool.  Genitourinary: Negative for hematuria and decreased urine volume.  Musculoskeletal: Negative for acute joint swelling Skin: Negative for color change and wound.  Neurological: Negative for tremors and numbness other than noted  Psychiatric/Behavioral: Negative for decreased concentration or  hyperactivity.       Objective:   Physical Exam BP 122/84  Pulse 85  Temp(Src) 98.4 F (36.9 C) (Oral)  Ht 5\' 6"  (1.676 m)  Wt 192 lb 6 oz (87.261 kg)  BMI 31.07 kg/m2  SpO2 95% VS noted, mild ill Constitutional: Pt appears well-developed and well-nourished.  HENT: Head: NCAT.  Right Ear: External ear normal.  Left Ear: External ear normal.  Eyes: Conjunctivae and EOM are normal. Pupils are equal, round, and reactive to light.  Bilat tm's with mild erythema.  Max sinus areas mild tender.  Pharynx with mild erythema, no exudate Neck: Normal range of motion. Neck supple.  Cardiovascular: Normal rate and regular rhythm.   Pulmonary/Chest: Effort normal and breath sounds without rales or wheezing Neurological: Pt is alert. Not confused  Skin: Skin is warm. No erythema.  Psychiatric: Pt behavior is normal. Thought content normal. not depressed affect or nervous today     Assessment & Plan:

## 2013-04-18 ENCOUNTER — Ambulatory Visit (INDEPENDENT_AMBULATORY_CARE_PROVIDER_SITE_OTHER): Payer: Self-pay | Admitting: *Deleted

## 2013-04-18 DIAGNOSIS — I480 Paroxysmal atrial fibrillation: Secondary | ICD-10-CM

## 2013-04-18 DIAGNOSIS — I4891 Unspecified atrial fibrillation: Secondary | ICD-10-CM

## 2013-04-18 LAB — MDC_IDC_ENUM_SESS_TYPE_REMOTE

## 2013-04-19 ENCOUNTER — Ambulatory Visit: Payer: Self-pay | Admitting: Internal Medicine

## 2013-04-20 ENCOUNTER — Ambulatory Visit (INDEPENDENT_AMBULATORY_CARE_PROVIDER_SITE_OTHER): Payer: Self-pay | Admitting: Internal Medicine

## 2013-04-20 ENCOUNTER — Encounter: Payer: Self-pay | Admitting: Internal Medicine

## 2013-04-20 VITALS — BP 130/92 | HR 91 | Temp 98.5°F | Ht 66.0 in | Wt 192.0 lb

## 2013-04-20 DIAGNOSIS — I509 Heart failure, unspecified: Secondary | ICD-10-CM

## 2013-04-20 DIAGNOSIS — J209 Acute bronchitis, unspecified: Secondary | ICD-10-CM

## 2013-04-20 DIAGNOSIS — I5032 Chronic diastolic (congestive) heart failure: Secondary | ICD-10-CM

## 2013-04-20 DIAGNOSIS — I10 Essential (primary) hypertension: Secondary | ICD-10-CM

## 2013-04-20 NOTE — Assessment & Plan Note (Signed)
Resolving, exam benign, reassure, no further eval or tx needed at this time

## 2013-04-20 NOTE — Assessment & Plan Note (Signed)
.  stable overall by history and exam, recent data reviewed with pt, and pt to continue medical treatment as before,  to f/u any worsening symptoms or concerns BP Readings from Last 3 Encounters:  04/20/13 130/92  04/13/13 122/84  01/25/13 154/90

## 2013-04-20 NOTE — Progress Notes (Signed)
Subjective:    Patient ID: Scott [name], male    DOB: November 04, 1955, 58 y.o.   MRN: [id number]  HPI Here after made appt yesterday for ongoing cough, fatigue, general weakness and malaise, went to bed at 730 pm last evening, but by this AM actually felt as if he "turned the corner" as all symptoms much improved except for a small minimally prod cough, Pt denies chest pain, increased sob or doe, wheezing, orthopnea, PND, increased LE swelling, palpitations, dizziness or syncope.  Pt denies new neurological symptoms such as new headache, or facial or extremity weakness or numbness   Pt denies polydipsia, polyuria. Wt essentially no change. Good compliance with meds Past Medical History  Diagnosis Date  . HYPOTHYROIDISM, ACQUIRED NEC 09/28/2006  . HYPERLIPIDEMIA 09/28/2006  . ANXIETY 09/28/2006  . OBSTRUCTIVE SLEEP APNEA 01/06/2008  . HYPERTENSION 09/28/2006  . Atrial fibrillation 09/28/2006    s/p afib ablation x 2  . Diastolic dysfunction 10/27/7900  . ALLERGIC RHINITIS 01/14/2007  . Long term current use of anticoagulant 04/08/2010  . Right knee DJD 07/09/2010  . Asbestos exposure   . Coronary artery disease 11/24/2011  . S/P CABG x 1 11/26/2011    Right internal mammary artery to right coronary artery  . S/P Maze operation for atrial fibrillation 11/26/2011    complete biatrial lesion set with clipping of LA appendage   Past Surgical History  Procedure Laterality Date  . Hand surgury      left  . Radiofrequency ablation for atrial flutter  2005    CTI  . Radiofrequency ablation for atrial fibrillation  12/30/07 and 06/05/09    afib ablation x 2 by JA  . Maze  11/26/2011    Procedure: MAZE;  Surgeon: Rexene Alberts, MD;  Location: [place];  Service: Open Heart Surgery;  Laterality: N/A;  Cryomaze   . Coronary artery bypass graft  11/26/2011    Procedure: CORONARY ARTERY BYPASS GRAFTING (CABG);  Surgeon: Rexene Alberts, MD;  Location: [place];  Service: Open Heart Surgery;  Laterality: N/A;  coronary  artery bypass on pump times one utilizing the right internal mammary artery, transesophageal echocardiogram   . Implantable loop recorder  07/06/12    Linq implanted by Dr Rayann Heman    reports that he quit smoking about 18 years ago. His smoking use included Cigarettes. He has a 39 pack-year smoking history. He does not have any smokeless tobacco history on file. He reports that he does not drink alcohol or use illicit drugs. family history includes Atrial fibrillation in his mother; Dementia in his father; Hypertension in his father; Stroke in his mother. Allergies  Allergen Reactions  . Zolpidem Tartrate Other (See Comments)    Extremely drowsy the next day   Current Outpatient Prescriptions on File Prior to Visit  Medication Sig Dispense Refill  . acetaminophen (TYLENOL) 500 MG tablet Take 500 mg by mouth every 6 (six) hours as needed for pain.       Marland Kitchen ALPRAZolam (XANAX) 0.5 MG tablet Take 1 tablet (0.5 mg total) by mouth 2 (two) times daily as needed. For anxiety  180 tablet  1  . aspirin EC 81 MG EC tablet Take 1 tablet (81 mg total) by mouth daily.      . budesonide-formoterol (SYMBICORT) 160-4.5 MCG/ACT inhaler Inhale 2 puffs into the lungs 2 (two) times daily.  3 Inhaler  3  . citalopram (CELEXA) 20 MG tablet Take one tablet by mouth one time daily  90  tablet  3  . enalapril (VASOTEC) 10 MG tablet Take 1 tablet (10 mg total) by mouth daily.  90 tablet  3  . furosemide (LASIX) 20 MG tablet Take 1 tablet (20 mg total) by mouth daily.  90 tablet  3  . HYDROcodone-homatropine (HYCODAN) 5-1.5 MG/5ML syrup Take 5 mLs by mouth every 6 (six) hours as needed for cough.  180 mL  0  . lovastatin (MEVACOR) 40 MG tablet TAKE 1 TABLET BY MOUTH TWICE DAILY  360 tablet  1  . metoprolol succinate (TOPROL-XL) 50 MG 24 hr tablet Take 1 tablet (50 mg total) by mouth daily. Take with or immediately following a meal.  90 tablet  3  . temazepam (RESTORIL) 30 MG capsule Take 1 capsule (30 mg total) by mouth at  bedtime as needed. For sleep  90 capsule  1  . traMADol (ULTRAM) 50 MG tablet Take 1 tablet (50 mg total) by mouth every 8 (eight) hours as needed.  60 tablet  2  . warfarin (COUMADIN) 2.5 MG tablet Take as directed  60 tablet  2   No current facility-administered medications on file prior to visit.   Review of Systems  Constitutional: Negative for unexpected weight change, or unusual diaphoresis  HENT: Negative for tinnitus.   Eyes: Negative for photophobia and visual disturbance.  Respiratory: Negative for choking and stridor.   Gastrointestinal: Negative for vomiting and blood in stool.  Genitourinary: Negative for hematuria and decreased urine volume.  Musculoskeletal: Negative for acute joint swelling Skin: Negative for color change and wound.  Neurological: Negative for tremors and numbness other than noted  Psychiatric/Behavioral: Negative for decreased concentration or  hyperactivity.       Objective:   Physical Exam BP 130/92  Pulse 91  Temp(Src) 98.5 F (36.9 C) (Oral)  Ht 5\' 6"  (1.676 m)  Wt 192 lb (87.091 kg)  BMI 31.00 kg/m2  SpO2 96% VS noted,  Constitutional: Pt appears well-developed and well-nourished.  HENT: Head: NCAT.  Right Ear: External ear normal.  Left Ear: External ear normal.  Eyes: Conjunctivae and EOM are normal. Pupils are equal, round, and reactive to light.  Neck: Normal range of motion. Neck supple.  Cardiovascular: Normal rate and regular rhythm.   Pulmonary/Chest: Effort normal and breath sounds decrease bilat, no rales or wheezing Neurological: Pt is alert. Not confused  Skin: Skin is warm. No erythema.  Psychiatric: Pt behavior is normal. Thought content normal.      Assessment & Plan:

## 2013-04-20 NOTE — Patient Instructions (Signed)
Please continue all other medications as before, and refills have been done if requested. Please have the pharmacy call with any other refills you may need.

## 2013-04-20 NOTE — Progress Notes (Signed)
Pre visit review using our clinic review tool, if applicable. No additional management support is needed unless otherwise documented below in the visit note. 

## 2013-04-20 NOTE — Assessment & Plan Note (Signed)
stable overall by history and exam, and pt to continue medical treatment as before,  to f/u any worsening symptoms or concerns 

## 2013-04-27 ENCOUNTER — Ambulatory Visit (INDEPENDENT_AMBULATORY_CARE_PROVIDER_SITE_OTHER): Payer: Self-pay | Admitting: *Deleted

## 2013-04-27 DIAGNOSIS — I4891 Unspecified atrial fibrillation: Secondary | ICD-10-CM

## 2013-04-27 DIAGNOSIS — I4892 Unspecified atrial flutter: Secondary | ICD-10-CM

## 2013-04-27 DIAGNOSIS — Z7901 Long term (current) use of anticoagulants: Secondary | ICD-10-CM

## 2013-04-27 DIAGNOSIS — Z5181 Encounter for therapeutic drug level monitoring: Secondary | ICD-10-CM

## 2013-04-27 LAB — POCT INR: INR: 4.1

## 2013-05-04 ENCOUNTER — Telehealth: Payer: Self-pay | Admitting: Internal Medicine

## 2013-05-04 MED ORDER — TEMAZEPAM 30 MG PO CAPS: 30.0000 mg | ORAL_CAPSULE | Freq: Every evening | ORAL | Status: DC | PRN

## 2013-05-04 MED ORDER — ENALAPRIL MALEATE 10 MG PO TABS: 10.0000 mg | ORAL_TABLET | Freq: Every day | ORAL | Status: DC

## 2013-05-04 MED ORDER — ALPRAZOLAM 0.5 MG PO TABS: 0.5000 mg | ORAL_TABLET | Freq: Two times a day (BID) | ORAL | Status: DC | PRN

## 2013-05-04 NOTE — Telephone Encounter (Signed)
Called the patient to confirm faxed Vasotec, alprazolam and temazepam hardcopy's to [facility] at [phone].

## 2013-05-04 NOTE — Telephone Encounter (Signed)
Done hardcopy to robin  

## 2013-05-04 NOTE — Telephone Encounter (Signed)
Faxed hardcopy's of vasotec, alprazolam and temazepam to [facility] at [phone]

## 2013-05-06 ENCOUNTER — Encounter: Payer: Self-pay | Admitting: Internal Medicine

## 2013-05-11 ENCOUNTER — Ambulatory Visit (INDEPENDENT_AMBULATORY_CARE_PROVIDER_SITE_OTHER): Payer: Self-pay | Admitting: *Deleted

## 2013-05-11 DIAGNOSIS — I4892 Unspecified atrial flutter: Secondary | ICD-10-CM

## 2013-05-11 DIAGNOSIS — I4891 Unspecified atrial fibrillation: Secondary | ICD-10-CM

## 2013-05-11 DIAGNOSIS — Z5181 Encounter for therapeutic drug level monitoring: Secondary | ICD-10-CM

## 2013-05-11 DIAGNOSIS — Z7901 Long term (current) use of anticoagulants: Secondary | ICD-10-CM

## 2013-05-11 LAB — POCT INR: INR: 1.3

## 2013-05-19 ENCOUNTER — Encounter: Payer: Self-pay | Admitting: Internal Medicine

## 2013-05-19 ENCOUNTER — Ambulatory Visit (INDEPENDENT_AMBULATORY_CARE_PROVIDER_SITE_OTHER): Payer: Self-pay | Admitting: *Deleted

## 2013-05-19 DIAGNOSIS — I4891 Unspecified atrial fibrillation: Secondary | ICD-10-CM

## 2013-05-25 ENCOUNTER — Ambulatory Visit (INDEPENDENT_AMBULATORY_CARE_PROVIDER_SITE_OTHER): Payer: Self-pay | Admitting: *Deleted

## 2013-05-25 DIAGNOSIS — Z7901 Long term (current) use of anticoagulants: Secondary | ICD-10-CM

## 2013-05-25 DIAGNOSIS — I4891 Unspecified atrial fibrillation: Secondary | ICD-10-CM

## 2013-05-25 DIAGNOSIS — Z5181 Encounter for therapeutic drug level monitoring: Secondary | ICD-10-CM

## 2013-05-25 DIAGNOSIS — I4892 Unspecified atrial flutter: Secondary | ICD-10-CM

## 2013-05-25 LAB — POCT INR: INR: 2

## 2013-06-09 ENCOUNTER — Encounter: Payer: Self-pay | Admitting: *Deleted

## 2013-06-10 LAB — MDC_IDC_ENUM_SESS_TYPE_REMOTE

## 2013-06-15 ENCOUNTER — Ambulatory Visit (INDEPENDENT_AMBULATORY_CARE_PROVIDER_SITE_OTHER): Payer: Self-pay | Admitting: *Deleted

## 2013-06-15 DIAGNOSIS — Z7901 Long term (current) use of anticoagulants: Secondary | ICD-10-CM

## 2013-06-15 DIAGNOSIS — Z5181 Encounter for therapeutic drug level monitoring: Secondary | ICD-10-CM

## 2013-06-15 DIAGNOSIS — I4891 Unspecified atrial fibrillation: Secondary | ICD-10-CM

## 2013-06-15 DIAGNOSIS — I4892 Unspecified atrial flutter: Secondary | ICD-10-CM

## 2013-06-15 LAB — POCT INR: INR: 2

## 2013-06-20 ENCOUNTER — Ambulatory Visit (INDEPENDENT_AMBULATORY_CARE_PROVIDER_SITE_OTHER): Payer: Self-pay | Admitting: *Deleted

## 2013-06-20 DIAGNOSIS — I4891 Unspecified atrial fibrillation: Secondary | ICD-10-CM

## 2013-06-20 DIAGNOSIS — I480 Paroxysmal atrial fibrillation: Secondary | ICD-10-CM

## 2013-06-30 ENCOUNTER — Encounter: Payer: Self-pay | Admitting: Internal Medicine

## 2013-07-13 ENCOUNTER — Ambulatory Visit (INDEPENDENT_AMBULATORY_CARE_PROVIDER_SITE_OTHER): Payer: Self-pay | Admitting: Internal Medicine

## 2013-07-13 ENCOUNTER — Ambulatory Visit (INDEPENDENT_AMBULATORY_CARE_PROVIDER_SITE_OTHER): Payer: Self-pay | Admitting: General Practice

## 2013-07-13 ENCOUNTER — Encounter: Payer: Self-pay | Admitting: Internal Medicine

## 2013-07-13 VITALS — BP 120/82 | HR 64 | Temp 98.2°F | Ht 66.0 in | Wt 193.5 lb

## 2013-07-13 DIAGNOSIS — I4891 Unspecified atrial fibrillation: Secondary | ICD-10-CM

## 2013-07-13 DIAGNOSIS — I10 Essential (primary) hypertension: Secondary | ICD-10-CM

## 2013-07-13 DIAGNOSIS — Z5181 Encounter for therapeutic drug level monitoring: Secondary | ICD-10-CM

## 2013-07-13 DIAGNOSIS — I509 Heart failure, unspecified: Secondary | ICD-10-CM

## 2013-07-13 DIAGNOSIS — Z7901 Long term (current) use of anticoagulants: Secondary | ICD-10-CM

## 2013-07-13 DIAGNOSIS — I4892 Unspecified atrial flutter: Secondary | ICD-10-CM

## 2013-07-13 DIAGNOSIS — E785 Hyperlipidemia, unspecified: Secondary | ICD-10-CM

## 2013-07-13 DIAGNOSIS — I5032 Chronic diastolic (congestive) heart failure: Secondary | ICD-10-CM

## 2013-07-13 LAB — POCT INR: INR: 2.7

## 2013-07-13 NOTE — Assessment & Plan Note (Addendum)
Volume stable, The current medical regimen is effective;  continue present plan and medications., f/u card as planned

## 2013-07-13 NOTE — Assessment & Plan Note (Signed)
stable overall by history and exam, recent data reviewed with pt, and pt to continue medical treatment as before,  to f/u any worsening symptoms or concerns BP Readings from Last 3 Encounters:  07/13/13 120/82  04/20/13 130/92  04/13/13 122/84

## 2013-07-13 NOTE — Assessment & Plan Note (Signed)
stable overall by history and exam, recent data reviewed with pt, and pt to continue medical treatment as before,  to f/u any worsening symptoms or concerns ble Lab Results  Component Value Date   LDLCALC 102* 11/25/2011   For f/u lab; pt states will bear the cost, though I informed him could cost > $300

## 2013-07-13 NOTE — Progress Notes (Signed)
Pre visit review using our clinic review tool, if applicable. No additional management support is needed unless otherwise documented below in the visit note. 

## 2013-07-13 NOTE — Progress Notes (Signed)
Subjective:    Patient ID: Scott [name], male    DOB: 1955-06-13, 58 y.o.   MRN: [id number]  HPI   Here for yearly f/u; he's not sure but thinks he may have [facility] today where the bills are forgiven; Overall doing ok;  Pt denies CP, worsening SOB, DOE, wheezing, orthopnea, PND, worsening LE edema, palpitations, dizziness or syncope.  Pt denies neurological change such as new headache, facial or extremity weakness.  Pt denies polydipsia, polyuria, or low sugar symptoms. Pt states overall good compliance with treatment and medications, good tolerability, and has been trying to follow lower cholesterol diet.  Pt denies worsening depressive symptoms, suicidal ideation or panic. No fever, night sweats, wt loss, loss of appetite, or other constitutional symptoms.  Pt states fair ability with ADL's, has mod fall risk due to some balance problem and tendency to stumble and falls at home, but not outside the home (just trips), home safety reviewed and adequate, no other significant changes in hearing or vision, and only occasionally active with exercise.  Followed per cardiology. Last INR therapeutic today.  Has been rec'd to consider change to savaysa for possible less cost.  He is re-applying for medicaid, and coumadin checks are up to $80 Past Medical History  Diagnosis Date  . HYPOTHYROIDISM, ACQUIRED NEC 09/28/2006  . HYPERLIPIDEMIA 09/28/2006  . ANXIETY 09/28/2006  . OBSTRUCTIVE SLEEP APNEA 01/06/2008  . HYPERTENSION 09/28/2006  . Atrial fibrillation 09/28/2006    s/p afib ablation x 2  . Diastolic dysfunction 07/30/2945  . ALLERGIC RHINITIS 01/14/2007  . Long term current use of anticoagulant 04/08/2010  . Right knee DJD 07/09/2010  . Asbestos exposure   . Coronary artery disease 11/24/2011  . S/P CABG x 1 11/26/2011    Right internal mammary artery to right coronary artery  . S/P Maze operation for atrial fibrillation 11/26/2011    complete biatrial lesion set with clipping of LA appendage    Past Surgical History  Procedure Laterality Date  . Hand surgury      left  . Radiofrequency ablation for atrial flutter  2005    CTI  . Radiofrequency ablation for atrial fibrillation  12/30/07 and 06/05/09    afib ablation x 2 by JA  . Maze  11/26/2011    Procedure: MAZE;  Surgeon: Rexene Alberts, MD;  Location: [place];  Service: Open Heart Surgery;  Laterality: N/A;  Cryomaze   . Coronary artery bypass graft  11/26/2011    Procedure: CORONARY ARTERY BYPASS GRAFTING (CABG);  Surgeon: Rexene Alberts, MD;  Location: [place];  Service: Open Heart Surgery;  Laterality: N/A;  coronary artery bypass on pump times one utilizing the right internal mammary artery, transesophageal echocardiogram   . Implantable loop recorder  07/06/12    Linq implanted by Dr Rayann Heman    reports that he quit smoking about 18 years ago. His smoking use included Cigarettes. He has a 39 pack-year smoking history. He does not have any smokeless tobacco history on file. He reports that he does not drink alcohol or use illicit drugs. family history includes Atrial fibrillation in his mother; Dementia in his father; Hypertension in his father; Stroke in his mother. Allergies  Allergen Reactions  . Zolpidem Tartrate Other (See Comments)    Extremely drowsy the next day   Current Outpatient Prescriptions on File Prior to Visit  Medication Sig Dispense Refill  . acetaminophen (TYLENOL) 500 MG tablet Take 500 mg by mouth every 6 (six)  hours as needed for pain.       Marland Kitchen ALPRAZolam (XANAX) 0.5 MG tablet Take 1 tablet (0.5 mg total) by mouth 2 (two) times daily as needed. For anxiety  180 tablet  1  . aspirin EC 81 MG EC tablet Take 1 tablet (81 mg total) by mouth daily.      . budesonide-formoterol (SYMBICORT) 160-4.5 MCG/ACT inhaler Inhale 2 puffs into the lungs 2 (two) times daily.  3 Inhaler  3  . citalopram (CELEXA) 20 MG tablet Take one tablet by mouth one time daily  90 tablet  3  . enalapril (VASOTEC) 10 MG tablet Take 1  tablet (10 mg total) by mouth daily.  90 tablet  3  . furosemide (LASIX) 20 MG tablet Take 1 tablet (20 mg total) by mouth daily.  90 tablet  3  . lovastatin (MEVACOR) 40 MG tablet TAKE 1 TABLET BY MOUTH TWICE DAILY  360 tablet  1  . metoprolol succinate (TOPROL-XL) 50 MG 24 hr tablet Take 1 tablet (50 mg total) by mouth daily. Take with or immediately following a meal.  90 tablet  3  . temazepam (RESTORIL) 30 MG capsule Take 1 capsule (30 mg total) by mouth at bedtime as needed. For sleep  90 capsule  1  . traMADol (ULTRAM) 50 MG tablet Take 1 tablet (50 mg total) by mouth every 8 (eight) hours as needed.  60 tablet  2  . warfarin (COUMADIN) 2.5 MG tablet Take as directed  60 tablet  2   No current facility-administered medications on file prior to visit.   Review of Systems Constitutional: Negative for increased diaphoresis, other activity, appetite or other siginficant weight change  HENT: Negative for worsening hearing loss, ear pain, facial swelling, mouth sores and neck stiffness.   Eyes: Negative for other worsening pain, redness or visual disturbance.  Respiratory: Negative for shortness of breath and wheezing.   Cardiovascular: Negative for chest pain and palpitations.  Gastrointestinal: Negative for diarrhea, blood in stool, abdominal distention or other pain Genitourinary: Negative for hematuria, flank pain or change in urine volume.  Musculoskeletal: Negative for myalgias or other joint complaints.  Skin: Negative for color change and wound.  Neurological: Negative for syncope and numbness. other than noted Hematological: Negative for adenopathy. or other swelling Psychiatric/Behavioral: Negative for hallucinations, self-injury, decreased concentration or other worsening agitation.      Objective:   Physical Exam BP 120/82  Pulse 64  Temp(Src) 98.2 F (36.8 C) (Oral)  Ht 5\' 6"  (1.676 m)  Wt 193 lb 8 oz (87.771 kg)  BMI 31.25 kg/m2  SpO2 97% VS noted, not ill  appearing Constitutional: Pt is oriented to person, place, and time. Appears well-developed and well-nourished.  Head: Normocephalic and atraumatic.  Right Ear: External ear normal.  Left Ear: External ear normal.  Nose: Nose normal.  Mouth/Throat: Oropharynx is clear and moist.  Eyes: Conjunctivae and EOM are normal. Pupils are equal, round, and reactive to light.  Neck: Normal range of motion. Neck supple. No JVD present. No tracheal deviation present.  Cardiovascular: Normal rate, regular rhythm, normal heart sounds and intact distal pulses.   Pulmonary/Chest: Effort normal and breath sounds without rales or wheezing  Abdominal: Soft. Bowel sounds are normal. NT. No HSM  Musculoskeletal: Normal range of motion. Exhibits no edema.  Lymphadenopathy:  Has no cervical adenopathy.  Neurological: Pt is alert and oriented to person, place, and time. Pt has normal reflexes. No cranial nerve deficit. Motor grossly intact Skin:  Skin is warm and dry. No rash noted.  No bruising or swelling Psychiatric:  Has irritable mood and affect. Behavior is normal.     Assessment & Plan:

## 2013-07-13 NOTE — Patient Instructions (Signed)
Please continue all other medications as before, and refills have been done if requested. Please have the pharmacy call with any other refills you may need.  Please continue your efforts at being more active, low cholesterol diet, and weight control.  Please keep your appointments with your specialists as you have planned  Please go to the LAB in the Basement (turn left off the elevator) for the tests to be done today  You will be contacted by phone if any changes need to be made immediately.  Otherwise, you will receive a letter about your results with an explanation, but please check with MyChart first.  Please check with [employer] office regarding the [facility], and also apply for medicaid  Please return in 1 year for your yearly visit, or sooner if needed

## 2013-07-19 ENCOUNTER — Ambulatory Visit (INDEPENDENT_AMBULATORY_CARE_PROVIDER_SITE_OTHER): Payer: Self-pay | Admitting: *Deleted

## 2013-07-19 DIAGNOSIS — I480 Paroxysmal atrial fibrillation: Secondary | ICD-10-CM

## 2013-07-19 DIAGNOSIS — I4891 Unspecified atrial fibrillation: Secondary | ICD-10-CM

## 2013-07-19 LAB — MDC_IDC_ENUM_SESS_TYPE_REMOTE
Date Time Interrogation Session: 20150517060535
MDC IDC SET ZONE DETECTION INTERVAL: 2000 ms
MDC IDC SET ZONE DETECTION INTERVAL: 400 ms
Zone Setting Detection Interval: 3000 ms

## 2013-07-26 ENCOUNTER — Encounter: Payer: Self-pay | Admitting: Internal Medicine

## 2013-07-26 ENCOUNTER — Telehealth: Payer: Self-pay | Admitting: General Practice

## 2013-07-26 ENCOUNTER — Ambulatory Visit (INDEPENDENT_AMBULATORY_CARE_PROVIDER_SITE_OTHER): Payer: Self-pay | Admitting: Internal Medicine

## 2013-07-26 VITALS — BP 150/80 | HR 80 | Ht 66.0 in | Wt 187.2 lb

## 2013-07-26 DIAGNOSIS — I4891 Unspecified atrial fibrillation: Secondary | ICD-10-CM

## 2013-07-26 DIAGNOSIS — I480 Paroxysmal atrial fibrillation: Secondary | ICD-10-CM

## 2013-07-26 DIAGNOSIS — I10 Essential (primary) hypertension: Secondary | ICD-10-CM

## 2013-07-26 LAB — BASIC METABOLIC PANEL
BUN: 19 mg/dL (ref 6–23)
CO2: 26 mEq/L (ref 19–32)
Calcium: 9.3 mg/dL (ref 8.4–10.5)
Chloride: 107 mEq/L (ref 96–112)
Creatinine, Ser: 1.1 mg/dL (ref 0.4–1.5)
GFR: 73 mL/min (ref >60.00)
Glucose, Bld: 97 mg/dL (ref 70–99)
POTASSIUM: 4 meq/L (ref 3.5–5.1)
SODIUM: 140 meq/L (ref 135–145)

## 2013-07-26 LAB — MDC_IDC_ENUM_SESS_TYPE_INCLINIC

## 2013-07-26 NOTE — Patient Instructions (Addendum)
Your physician wants you to follow-up in: 6 months with Dr Knox Saliva will receive a reminder letter in the mail two months in advance. If you don't receive a letter, please call our office to schedule the follow-up appointment.  Will continue monthly summary reports with LINQ implant  Your physician recommends that you return for lab work today  BMP    IF BMP normal will start Savaysa 60mg  daily

## 2013-07-26 NOTE — Progress Notes (Signed)
HPI [name] returns today for followup. He is a very pleasant middle-age man with a history of atrial fibrillation and flutter, status post multiple ablations, who also has coronary disease, and underwent single-vessel bypass and maze surgery just a year ago. In the interim he has been stable except he does have dyspnea with exertion. No he denies chest pain. No peripheral edema. His weight has not changed. He is trying to lose weight. He walks up to 3 miles a day. Slowly.  Allergies  Allergen Reactions  . Zolpidem Tartrate Other (See Comments)    Extremely drowsy the next day     Current Outpatient Prescriptions  Medication Sig Dispense Refill  . acetaminophen (TYLENOL) 500 MG tablet Take 500 mg by mouth every 6 (six) hours as needed for pain.       Marland Kitchen ALPRAZolam (XANAX) 0.5 MG tablet Take 1 tablet (0.5 mg total) by mouth 2 (two) times daily as needed. For anxiety  180 tablet  1  . aspirin EC 81 MG EC tablet Take 1 tablet (81 mg total) by mouth daily.      . budesonide-formoterol (SYMBICORT) 160-4.5 MCG/ACT inhaler Inhale 2 puffs into the lungs 2 (two) times daily.  3 Inhaler  3  . citalopram (CELEXA) 20 MG tablet Take one tablet by mouth one time daily  90 tablet  3  . enalapril (VASOTEC) 10 MG tablet Take 1 tablet (10 mg total) by mouth daily.  90 tablet  3  . lovastatin (MEVACOR) 40 MG tablet TAKE 1 TABLET BY MOUTH TWICE DAILY  360 tablet  1  . metoprolol succinate (TOPROL-XL) 50 MG 24 hr tablet Take 1 tablet (50 mg total) by mouth daily. Take with or immediately following a meal.  90 tablet  3  . temazepam (RESTORIL) 30 MG capsule Take 1 capsule (30 mg total) by mouth at bedtime as needed. For sleep  90 capsule  1  . traMADol (ULTRAM) 50 MG tablet Take 1 tablet (50 mg total) by mouth every 8 (eight) hours as needed.  60 tablet  2  . warfarin (COUMADIN) 2.5 MG tablet Take as directed  60 tablet  2   No current facility-administered medications for this visit.     Past  Medical History  Diagnosis Date  . HYPOTHYROIDISM, ACQUIRED NEC 09/28/2006  . HYPERLIPIDEMIA 09/28/2006  . ANXIETY 09/28/2006  . OBSTRUCTIVE SLEEP APNEA 01/06/2008  . HYPERTENSION 09/28/2006  . Atrial fibrillation 09/28/2006    s/p afib ablation x 2  . Diastolic dysfunction 10/02/3808  . ALLERGIC RHINITIS 01/14/2007  . Long term current use of anticoagulant 04/08/2010  . Right knee DJD 07/09/2010  . Asbestos exposure   . Coronary artery disease 11/24/2011  . S/P CABG x 1 11/26/2011    Right internal mammary artery to right coronary artery  . S/P Maze operation for atrial fibrillation 11/26/2011    complete biatrial lesion set with clipping of LA appendage    ROS:   All systems reviewed and negative except as noted in the HPI.   Past Surgical History  Procedure Laterality Date  . Hand surgury      left  . Radiofrequency ablation for atrial flutter  2005    CTI  . Radiofrequency ablation for atrial fibrillation  12/30/07 and 06/05/09    afib ablation x 2 by JA  . Maze  11/26/2011    Procedure: MAZE;  Surgeon: Rexene Alberts, MD;  Location: [facility];  Service: Open Heart Surgery;  Laterality: N/A;  Cryomaze   . Coronary artery bypass graft  11/26/2011    Procedure: CORONARY ARTERY BYPASS GRAFTING (CABG);  Surgeon: Rexene Alberts, MD;  Location: [facility];  Service: Open Heart Surgery;  Laterality: N/A;  coronary artery bypass on pump times one utilizing the right internal mammary artery, transesophageal echocardiogram   . Implantable loop recorder  07/06/12    Linq implanted by Dr Rayann Heman     Family History  Problem Relation Age of Onset  . Dementia Father   . Hypertension Father   . Atrial fibrillation Mother   . Stroke Mother      History   Social History  . Marital Status: Single    Spouse Name: N/A    Number of Children: N/A  . Years of Education: N/A   Occupational History  . worked previously in the [employer] and feels that he was exposed to asbestos.    Social History Main  Topics  . Smoking status: Former Smoker -- 1.50 packs/day for 26 years    Types: Cigarettes    Quit date: 04/06/1995  . Smokeless tobacco: Not on file     Comment: not smoked over 10 years  . Alcohol Use: No  . Drug Use: No  . Sexual Activity: Not Currently   Other Topics Concern  . Not on file   Social History Narrative   Pt lives in [place], [place] with his friend.      BP 150/80  Pulse 80  Ht 5\' 6"  (1.676 m)  Wt 187 lb 3.2 oz (84.913 kg)  BMI 30.23 kg/m2  Physical Exam:  Well appearing 58 yo man, NAD HEENT: Unremarkable Neck:  No JVD, no thyromegally Back:  No CVA tenderness Lungs:  Clear with no wheezes HEART:  Regular rate rhythm, no murmurs, no rubs, no clicks Abd:  soft, positive bowel sounds, no organomegally, no rebound, no guarding Ext:  2 plus pulses, no edema, no cyanosis, no clubbing Skin:  No rashes no nodules Neuro:  CN II through XII intact, motor grossly intact   DEVICE  Normal device function.  ILR. See PaceArt for details. Sinus rhythm no atrial fib  Assess/Plan:

## 2013-07-26 NOTE — Assessment & Plan Note (Signed)
With his underlying lung disease and cough, will switch from his ACE inhibitor to cozaar.

## 2013-07-26 NOTE — Telephone Encounter (Signed)
Message copied by Warden Fillers on Tue Jul 26, 2013  1:22 PM ------      Message from: Evans Lance      Created: Mon Jul 18, 2013  9:50 PM       Sounds good to me. GT      ----- Message -----         From: Warden Fillers, RN         Sent: 07/14/2013   7:56 AM           To: Evans Lance, MD            Dr. Lovena Le,  Please see below message. Thanks!      ----- Message -----         From: Thompson Grayer, MD         Sent: 07/13/2013  10:39 PM           To: Warden Fillers, RN, Evans Lance, MD            Would need to obtain Creatinine Clearance ( I do not see recent labs).  If CrCl is >95 then this would be reasonable.      He is followed primarily by Dr Lovena Le.  I would therefore want his input also.                        ----- Message -----         From: Warden Fillers, RN         Sent: 07/13/2013   8:57 AM           To: Thompson Grayer, MD            Pt taking coumadin and has no insurance. He is interested in alternative drug for coumadin. Would he be a candidate for Savaysa (edoxaban) $4.00/Month for commercial Ins. or cash pay.            Thanks,      Villa Herb, RN                                     ------

## 2013-07-26 NOTE — Assessment & Plan Note (Addendum)
He appears to be maintaining NSR. He will continue his current meds. I will try and switch him to [facility] from coumadin if his creatinine clearance is less than 95.

## 2013-07-28 ENCOUNTER — Other Ambulatory Visit: Payer: Self-pay | Admitting: General Practice

## 2013-07-28 ENCOUNTER — Other Ambulatory Visit: Payer: Self-pay | Admitting: Internal Medicine

## 2013-07-28 MED ORDER — WARFARIN SODIUM 2.5 MG PO TABS: ORAL_TABLET | ORAL | Status: DC

## 2013-07-28 MED ORDER — BUDESONIDE-FORMOTEROL FUMARATE 160-4.5 MCG/ACT IN AERO: 2.0000 | INHALATION_SPRAY | Freq: Two times a day (BID) | RESPIRATORY_TRACT | Status: DC

## 2013-07-28 NOTE — Addendum Note (Signed)
Addended by: Sharon Seller B on: 07/28/2013 02:01 PM   Modules accepted: Orders

## 2013-07-28 NOTE — Telephone Encounter (Signed)
Pt sent  Note dated Jul 27, 2013 for refill request for:  Warfarin, (but should call coumadin clinic)  Temazepam, (but too soon, has 1 refill)  Alprazolam (but too soon, has 1 refill)  symbicort - Done hardcopy to robin  Robin to inform pt

## 2013-07-28 NOTE — Telephone Encounter (Signed)
Called informed the patient to pickup hardcopy at the front desk for symbicort.  Informed to check with the pharmacy on temazepam and alprazolem refills.  Will sent coumadin request to [facility].

## 2013-08-01 IMAGING — CR DG CHEST 2V
2 series · 2 of 2 positions shown · non-contrast
Comparison: 12/22/2011

CLINICAL DATA: Shortness of breath, cough

CHEST - 2 VIEW

[view not recorded (1 of 2)]
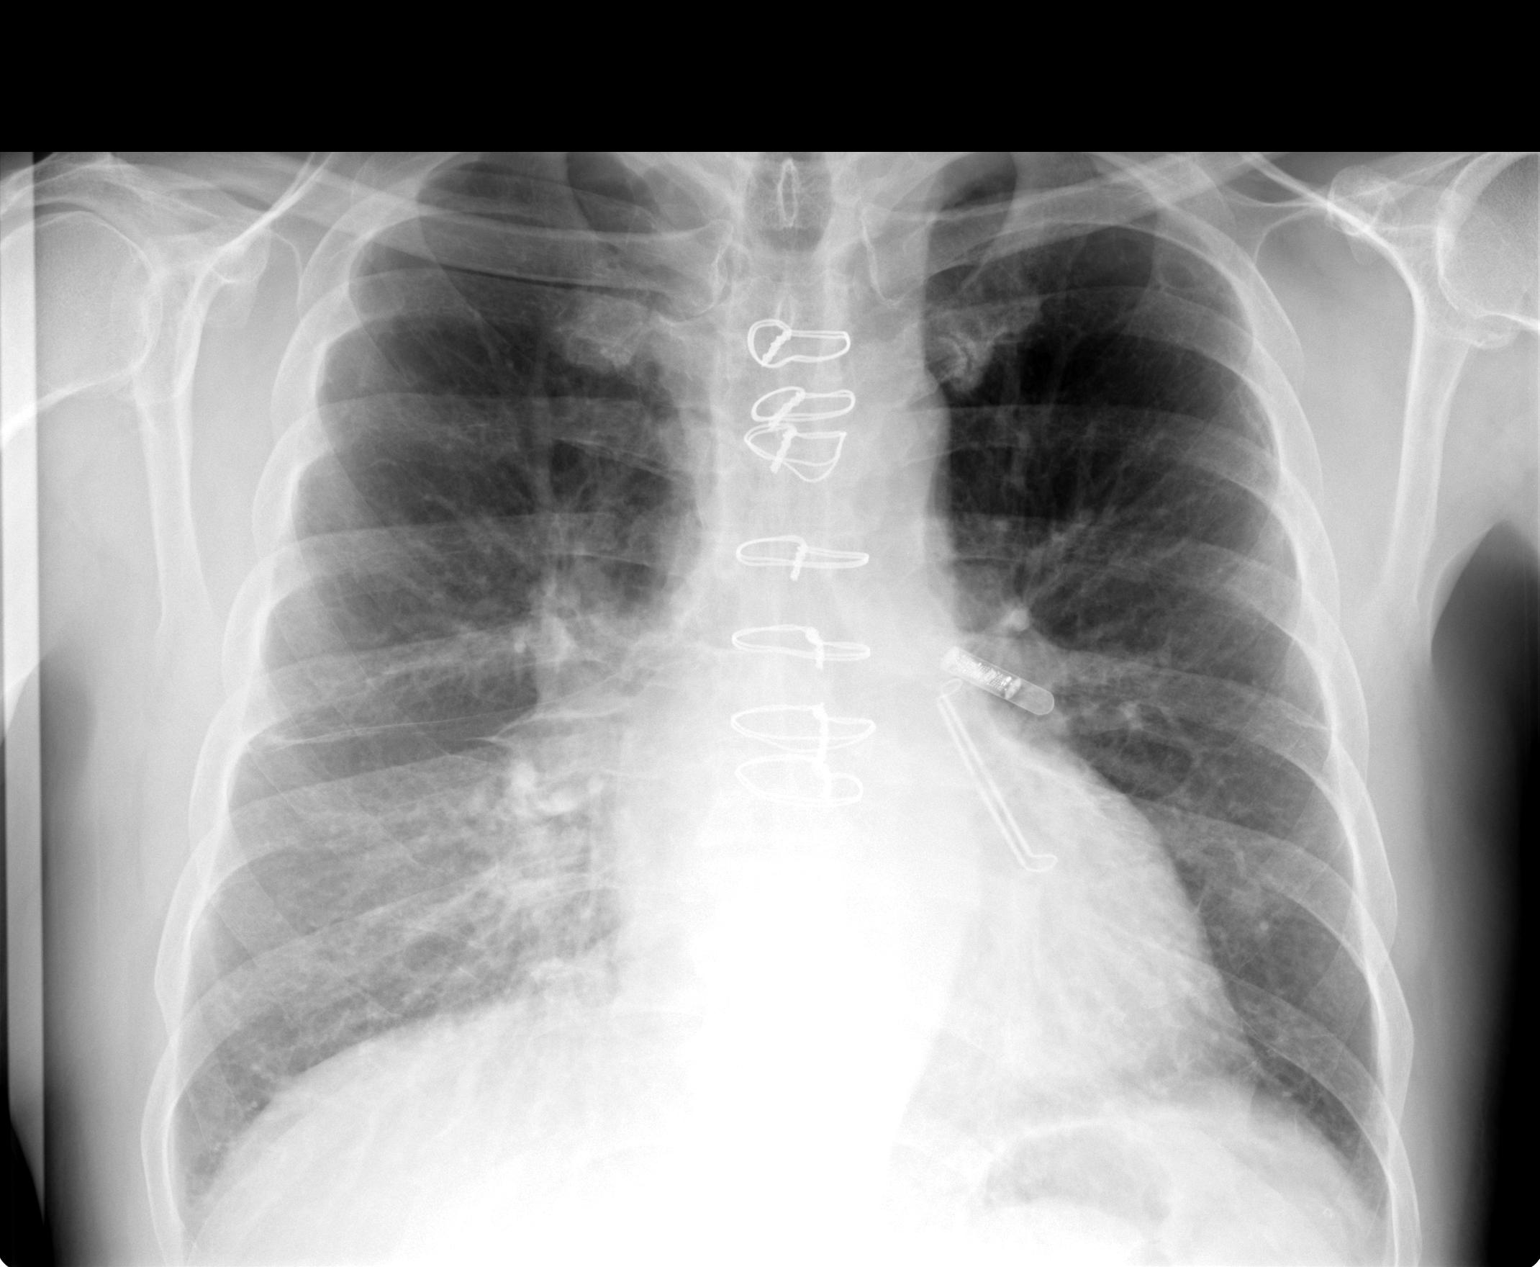

[view not recorded (2 of 2)]
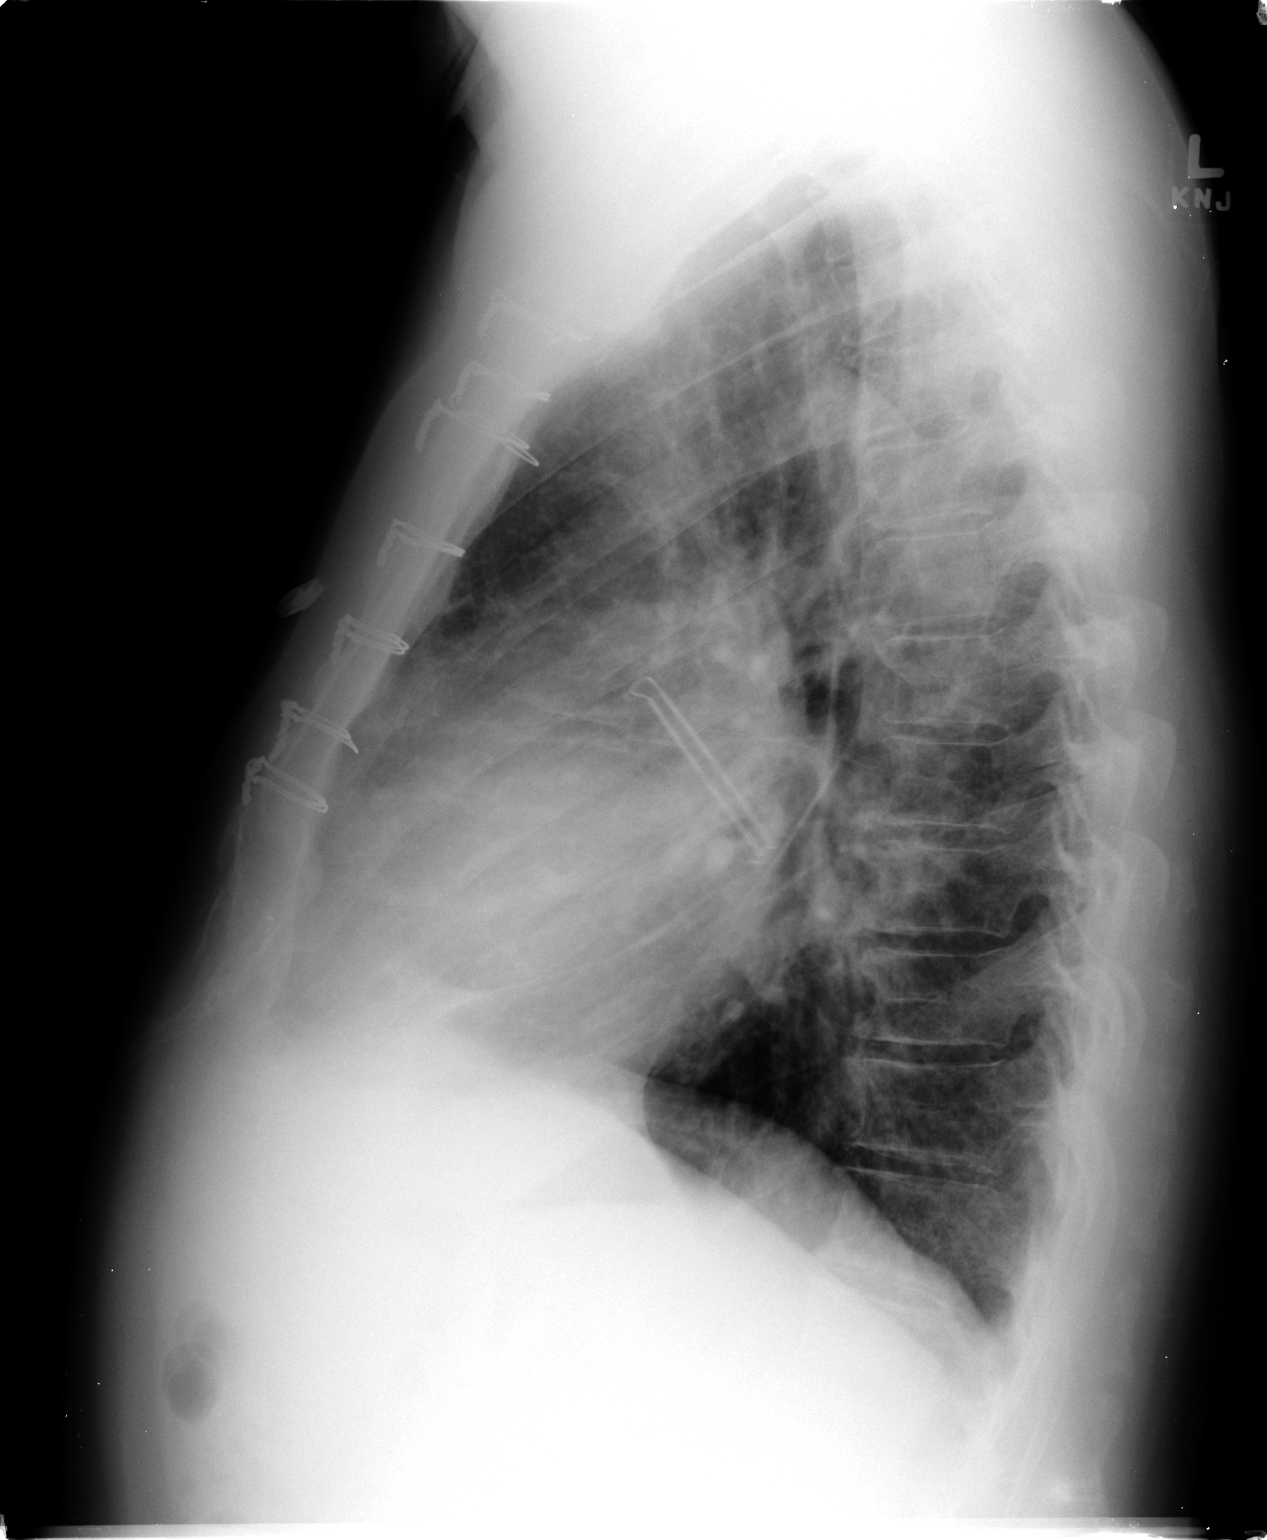

[2 of 2 positions shown; findings below may reference images not displayed]

FINDINGS: Cardiomediastinal silhouette is stable.  Again noted
status post median sternotomy.  No acute infiltrate or pulmonary
edema.  Bony thorax is stable.
IMPRESSION: No active disease.  No significant change.

## 2013-08-09 ENCOUNTER — Telehealth: Payer: Self-pay | Admitting: Internal Medicine

## 2013-08-09 NOTE — Telephone Encounter (Signed)
Spoke with pt about monitor issues he had already resolved the issue.

## 2013-08-09 NOTE — Telephone Encounter (Signed)
New message     Having problems with his "equipment".  Please call

## 2013-08-10 ENCOUNTER — Ambulatory Visit (INDEPENDENT_AMBULATORY_CARE_PROVIDER_SITE_OTHER): Payer: Self-pay | Admitting: General Practice

## 2013-08-10 ENCOUNTER — Other Ambulatory Visit: Payer: Self-pay | Admitting: General Practice

## 2013-08-10 DIAGNOSIS — I4891 Unspecified atrial fibrillation: Secondary | ICD-10-CM

## 2013-08-10 DIAGNOSIS — Z5181 Encounter for therapeutic drug level monitoring: Secondary | ICD-10-CM

## 2013-08-10 DIAGNOSIS — Z7901 Long term (current) use of anticoagulants: Secondary | ICD-10-CM

## 2013-08-10 DIAGNOSIS — I4892 Unspecified atrial flutter: Secondary | ICD-10-CM

## 2013-08-10 LAB — POCT INR: INR: 2.1

## 2013-08-10 MED ORDER — EDOXABAN TOSYLATE 60 MG PO TABS: 60.0000 mg | ORAL_TABLET | Freq: Every day | ORAL | Status: DC

## 2013-08-10 NOTE — Progress Notes (Signed)
Pre visit review using our clinic review tool, if applicable. No additional management support is needed unless otherwise documented below in the visit note. 

## 2013-08-17 ENCOUNTER — Ambulatory Visit (INDEPENDENT_AMBULATORY_CARE_PROVIDER_SITE_OTHER): Payer: Self-pay | Admitting: General Practice

## 2013-08-17 ENCOUNTER — Telehealth: Payer: Self-pay | Admitting: Internal Medicine

## 2013-08-17 DIAGNOSIS — Z5181 Encounter for therapeutic drug level monitoring: Secondary | ICD-10-CM

## 2013-08-17 NOTE — Progress Notes (Signed)
Pre visit review using our clinic review tool, if applicable. No additional management support is needed unless otherwise documented below in the visit note. 

## 2013-08-19 ENCOUNTER — Ambulatory Visit (INDEPENDENT_AMBULATORY_CARE_PROVIDER_SITE_OTHER): Payer: Self-pay | Admitting: *Deleted

## 2013-08-19 DIAGNOSIS — I480 Paroxysmal atrial fibrillation: Secondary | ICD-10-CM

## 2013-08-19 DIAGNOSIS — I4891 Unspecified atrial fibrillation: Secondary | ICD-10-CM

## 2013-08-19 LAB — MDC_IDC_ENUM_SESS_TYPE_REMOTE
MDC IDC SESS DTM: 20150616170434
MDC IDC SET ZONE DETECTION INTERVAL: 2000 ms
MDC IDC SET ZONE DETECTION INTERVAL: 400 ms
Zone Setting Detection Interval: 3000 ms

## 2013-08-22 ENCOUNTER — Other Ambulatory Visit: Payer: Self-pay | Admitting: Internal Medicine

## 2013-08-23 NOTE — Telephone Encounter (Signed)
Done hardcopy to robin  

## 2013-08-23 NOTE — Progress Notes (Signed)
Loop recorder 

## 2013-08-23 NOTE — Telephone Encounter (Signed)
Faxed hardcopy to [facility] Scott [name]

## 2013-08-25 ENCOUNTER — Other Ambulatory Visit: Payer: Self-pay | Admitting: Internal Medicine

## 2013-09-06 ENCOUNTER — Other Ambulatory Visit: Payer: Self-pay | Admitting: Internal Medicine

## 2013-09-15 ENCOUNTER — Encounter: Payer: Self-pay | Admitting: Internal Medicine

## 2013-09-19 ENCOUNTER — Ambulatory Visit (INDEPENDENT_AMBULATORY_CARE_PROVIDER_SITE_OTHER): Payer: Self-pay | Admitting: *Deleted

## 2013-09-19 DIAGNOSIS — I480 Paroxysmal atrial fibrillation: Secondary | ICD-10-CM

## 2013-09-19 DIAGNOSIS — I4891 Unspecified atrial fibrillation: Secondary | ICD-10-CM

## 2013-09-19 LAB — MDC_IDC_ENUM_SESS_TYPE_REMOTE
Date Time Interrogation Session: 20150731193435
MDC IDC SET ZONE DETECTION INTERVAL: 3000 ms
MDC IDC SET ZONE DETECTION INTERVAL: 400 ms
Zone Setting Detection Interval: 2000 ms

## 2013-09-21 NOTE — Progress Notes (Signed)
Loop recorder 

## 2013-09-29 ENCOUNTER — Ambulatory Visit (INDEPENDENT_AMBULATORY_CARE_PROVIDER_SITE_OTHER): Payer: Self-pay | Admitting: Internal Medicine

## 2013-09-29 ENCOUNTER — Encounter: Payer: Self-pay | Admitting: Internal Medicine

## 2013-09-29 VITALS — BP 152/90 | HR 74 | Temp 98.1°F | Ht 66.0 in | Wt 185.2 lb

## 2013-09-29 DIAGNOSIS — F411 Generalized anxiety disorder: Secondary | ICD-10-CM

## 2013-09-29 DIAGNOSIS — J449 Chronic obstructive pulmonary disease, unspecified: Secondary | ICD-10-CM

## 2013-09-29 DIAGNOSIS — I10 Essential (primary) hypertension: Secondary | ICD-10-CM

## 2013-09-29 NOTE — Progress Notes (Signed)
Subjective:    Patient ID: Scott [name], male    DOB: 05-14-1955, 58 y.o.   MRN: [id number]  HPI  Here to f/u; overall doing ok,  Pt denies chest pain, increased sob or doe, wheezing, orthopnea, PND, increased LE swelling, palpitations, dizziness or syncope.  Pt denies polydipsia, polyuria, or low sugar symptoms such as weakness or confusion improved with po intake.  Pt denies new neurological symptoms such as new headache, or facial or extremity weakness or numbness.   Pt states overall good compliance with meds, has been trying to follow lower cholesterol diet, with wt overall stable,  but little exercise however. Had abnormal drug screen for cannabis, wants to state today he does not smoke pot, though he is around others that do.   Wants to try off the citalopram to see if remains stable.  Denies worsening depressive symptoms, suicidal ideation, or panic.   Now on savaysa per cardiology, much more affordable, no overt bleeding or bruising Past Medical History  Diagnosis Date  . HYPOTHYROIDISM, ACQUIRED NEC 09/28/2006  . HYPERLIPIDEMIA 09/28/2006  . ANXIETY 09/28/2006  . OBSTRUCTIVE SLEEP APNEA 01/06/2008  . HYPERTENSION 09/28/2006  . Atrial fibrillation 09/28/2006    s/p afib ablation x 2  . Diastolic dysfunction 04/26/2023  . ALLERGIC RHINITIS 01/14/2007  . Long term current use of anticoagulant 04/08/2010  . Right knee DJD 07/09/2010  . Asbestos exposure   . Coronary artery disease 11/24/2011  . S/P CABG x 1 11/26/2011    Right internal mammary artery to right coronary artery  . S/P Maze operation for atrial fibrillation 11/26/2011    complete biatrial lesion set with clipping of LA appendage   Past Surgical History  Procedure Laterality Date  . Hand surgury      left  . Radiofrequency ablation for atrial flutter  2005    CTI  . Radiofrequency ablation for atrial fibrillation  12/30/07 and 06/05/09    afib ablation x 2 by JA  . Maze  11/26/2011    Procedure: MAZE;  Surgeon: Rexene Alberts,  MD;  Location: [facility];  Service: Open Heart Surgery;  Laterality: N/A;  Cryomaze   . Coronary artery bypass graft  11/26/2011    Procedure: CORONARY ARTERY BYPASS GRAFTING (CABG);  Surgeon: Rexene Alberts, MD;  Location: [facility];  Service: Open Heart Surgery;  Laterality: N/A;  coronary artery bypass on pump times one utilizing the right internal mammary artery, transesophageal echocardiogram   . Implantable loop recorder  07/06/12    Linq implanted by Dr Rayann Heman    reports that he quit smoking about 18 years ago. His smoking use included Cigarettes. He has a 39 pack-year smoking history. He does not have any smokeless tobacco history on file. He reports that he does not drink alcohol or use illicit drugs. family history includes Atrial fibrillation in his mother; Dementia in his father; Hypertension in his father; Stroke in his mother. Allergies  Allergen Reactions  . Zolpidem Tartrate Other (See Comments)    Extremely drowsy the next day   Current Outpatient Prescriptions on File Prior to Visit  Medication Sig Dispense Refill  . acetaminophen (TYLENOL) 500 MG tablet Take 500 mg by mouth every 6 (six) hours as needed for pain.       Marland Kitchen ALPRAZolam (XANAX) 0.5 MG tablet Take 1 tablet (0.5 mg total) by mouth 2 (two) times daily as needed. For anxiety  180 tablet  1  . aspirin EC 81 MG EC tablet  Take 1 tablet (81 mg total) by mouth daily.      . budesonide-formoterol (SYMBICORT) 160-4.5 MCG/ACT inhaler Inhale 2 puffs into the lungs 2 (two) times daily.  3 Inhaler  3  . Edoxaban Tosylate (SAVAYSA) 60 MG TABS Take 60 mg by mouth daily.  30 tablet  11  . enalapril (VASOTEC) 10 MG tablet Take 1 tablet (10 mg total) by mouth daily.  90 tablet  3  . lovastatin (MEVACOR) 40 MG tablet TAKE 1 TABLET BY MOUTH TWICE DAILY  360 tablet  1  . metoprolol succinate (TOPROL-XL) 50 MG 24 hr tablet TAKE 1 TABLET BY MOUTH DAILY. TAKE WITH OR IMMEDIATELY FOLLOWING A MEAL. (GENERIC FOR TOPROL XL)  90 tablet  3  . temazepam  (RESTORIL) 30 MG capsule Take 1 capsule (30 mg total) by mouth at bedtime as needed. For sleep  90 capsule  1  . traMADol (ULTRAM) 50 MG tablet TAKE ONE TABLET BY MOUTH EVERY EIGHT HOURS AS NEEDED   60 tablet  2   No current facility-administered medications on file prior to visit.    Review of Systems  Constitutional: Negative for unusual diaphoresis or other sweats  HENT: Negative for ringing in ear Eyes: Negative for double vision or worsening visual disturbance.  Respiratory: Negative for choking and stridor.   Gastrointestinal: Negative for vomiting or other signifcant bowel change Genitourinary: Negative for hematuria or decreased urine volume.  Musculoskeletal: Negative for other MSK pain or swelling Skin: Negative for color change and worsening wound.  Neurological: Negative for tremors and numbness other than noted  Psychiatric/Behavioral: Negative for decreased concentration or agitation other than above       Objective:   Physical Exam BP 152/90  Pulse 74  Temp(Src) 98.1 F (36.7 C) (Oral)  Ht 5\' 6"  (1.676 m)  Wt 185 lb 4 oz (84.029 kg)  BMI 29.91 kg/m2  SpO2 97% VS noted,  Constitutional: Pt appears well-developed, well-nourished.  HENT: Head: NCAT.  Right Ear: External ear normal.  Left Ear: External ear normal.  Eyes: . Pupils are equal, round, and reactive to light. Conjunctivae and EOM are normal Neck: Normal range of motion. Neck supple.  Cardiovascular: Normal rate and regular rhythm.   Pulmonary/Chest: Effort normal and breath sounds nor.  Abd:  Soft, NT, ND, + BS Neurological: Pt is alert. Not confused , motor grossly intact Skin: Skin is warm. No rash Psychiatric: Pt behavior is normal. No agitation.     Assessment & Plan:

## 2013-09-29 NOTE — Patient Instructions (Signed)
OK to stop the citalopram  Please continue all other medications as before, and refills have been done if requested.  Please have the pharmacy call with any other refills you may need.  Please continue your efforts at being more active, low cholesterol diet, and weight control.  Please keep your appointments with your specialists as you may have planned

## 2013-09-29 NOTE — Assessment & Plan Note (Signed)
stable overall by history and exam, recent data reviewed with pt, and pt to continue medical treatment as before,  to f/u any worsening symptoms or concerns BP Readings from Last 3 Encounters:  09/29/13 152/90  07/26/13 150/80  07/13/13 120/82

## 2013-09-29 NOTE — Progress Notes (Signed)
Pre visit review using our clinic review tool, if applicable. No additional management support is needed unless otherwise documented below in the visit note. 

## 2013-09-29 NOTE — Assessment & Plan Note (Signed)
[place] for off citalopram for now,  to f/u any worsening symptoms or concerns, consider re-start for worsening symtpoms

## 2013-09-29 NOTE — Assessment & Plan Note (Signed)
stable overall by history and exam, recent data reviewed with pt, and pt to continue medical treatment as before,  to f/u any worsening symptoms or concerns SpO2 Readings from Last 3 Encounters:  09/29/13 97%  07/13/13 97%  04/20/13 96%

## 2013-10-11 ENCOUNTER — Other Ambulatory Visit: Payer: Self-pay | Admitting: Internal Medicine

## 2013-10-19 ENCOUNTER — Ambulatory Visit (INDEPENDENT_AMBULATORY_CARE_PROVIDER_SITE_OTHER): Payer: Self-pay | Admitting: *Deleted

## 2013-10-19 DIAGNOSIS — I4891 Unspecified atrial fibrillation: Secondary | ICD-10-CM

## 2013-10-19 DIAGNOSIS — I480 Paroxysmal atrial fibrillation: Secondary | ICD-10-CM

## 2013-10-25 ENCOUNTER — Telehealth: Payer: Self-pay | Admitting: *Deleted

## 2013-10-25 MED ORDER — TEMAZEPAM 30 MG PO CAPS: 30.0000 mg | ORAL_CAPSULE | Freq: Every evening | ORAL | Status: DC | PRN

## 2013-10-25 MED ORDER — ALPRAZOLAM 0.5 MG PO TABS: 0.5000 mg | ORAL_TABLET | Freq: Two times a day (BID) | ORAL | Status: DC | PRN

## 2013-10-25 NOTE — Telephone Encounter (Signed)
Left msg on triage requesting refill on his alprazolam & temazepam. Needing rx [facility] @ [phone]...Scott [name]

## 2013-10-25 NOTE — Telephone Encounter (Signed)
Done hardcopy to robin  

## 2013-10-26 NOTE — Telephone Encounter (Signed)
Faxed hardcopy of Both Alp[facility]olam [facility]  The patient has been informed as well scripts sent in as requested.

## 2013-10-27 NOTE — Progress Notes (Signed)
Loop recorder 

## 2013-10-31 ENCOUNTER — Encounter: Payer: Self-pay | Admitting: Internal Medicine

## 2013-11-03 ENCOUNTER — Encounter: Payer: Self-pay | Admitting: Internal Medicine

## 2013-11-08 LAB — MDC_IDC_ENUM_SESS_TYPE_REMOTE
Date Time Interrogation Session: 20150831121726
MDC IDC SET ZONE DETECTION INTERVAL: 2000 ms
MDC IDC SET ZONE DETECTION INTERVAL: 3000 ms
Zone Setting Detection Interval: 400 ms

## 2013-11-14 ENCOUNTER — Ambulatory Visit (INDEPENDENT_AMBULATORY_CARE_PROVIDER_SITE_OTHER): Payer: Self-pay | Admitting: Thoracic Surgery (Cardiothoracic Vascular Surgery)

## 2013-11-14 ENCOUNTER — Encounter: Payer: Self-pay | Admitting: Thoracic Surgery (Cardiothoracic Vascular Surgery)

## 2013-11-14 VITALS — BP 156/83 | HR 59 | Resp 16 | Ht 66.0 in | Wt 185.0 lb

## 2013-11-14 DIAGNOSIS — Z9889 Other specified postprocedural states: Secondary | ICD-10-CM

## 2013-11-14 DIAGNOSIS — I2510 Atherosclerotic heart disease of native coronary artery without angina pectoris: Secondary | ICD-10-CM

## 2013-11-14 DIAGNOSIS — Z8679 Personal history of other diseases of the circulatory system: Secondary | ICD-10-CM

## 2013-11-14 DIAGNOSIS — Z951 Presence of aortocoronary bypass graft: Secondary | ICD-10-CM

## 2013-11-14 NOTE — Progress Notes (Signed)
[facility] [address]       [place] [address]             [phone]     CARDIOTHORACIC SURGERY OFFICE NOTE  Referring Provider is Scott Grayer, MD Primary Cardiologist is Scott Lance, MD PCP is Scott Cower, MD   HPI:  Patient returns for followup approximately 2 years status post Maze procedure with single-vessel coronary artery bypass grafting on 11/26/2011.  He was last seen here in our office on 11/15/2012. Over the past year he has remained stable, and he has been maintaining sinus rhythm. He states that he feels much better than he did prior to surgery. He still has some exertional shortness of breath and chest tightness, but this is "much improved" in comparison to prior to surgery. This does not limit his physical activities to any significant degree. He walks 4-5 miles every day without any difficulty.  He was seen in followup by Dr. Lovena [name] this past June, at which time Coumadin was stopped and the patient was started on [facility] for anticoagulation.  The patient states that since then he has had problems with insomnia. He denies any tachypalpitations to suggest recurrence of atrial fibrillation. Prior to surgery he was very symptomatic whenever he developed paroxysmal atrial fibrillation. He states that he has not had any similar episodes since his surgery.    Current Outpatient Prescriptions  Medication Sig Dispense Refill  . acetaminophen (TYLENOL) 500 MG tablet Take 500 mg by mouth every 6 (six) hours as needed for pain.       Marland Kitchen ALPRAZolam (XANAX) 0.5 MG tablet Take 1 tablet (0.5 mg total) by mouth 2 (two) times daily as needed. For anxiety  180 tablet  1  . aspirin EC 81 MG EC tablet Take 1 tablet (81 mg total) by mouth daily.      . budesonide-formoterol (SYMBICORT) 160-4.5 MCG/ACT inhaler Inhale 2 puffs into the lungs 2 (two) times daily.  3 Inhaler  3  . Edoxaban Tosylate (SAVAYSA) 60 MG TABS Take 60 mg by mouth daily.  30 tablet  11  . enalapril  (VASOTEC) 10 MG tablet Take 1 tablet (10 mg total) by mouth daily.  90 tablet  3  . lovastatin (MEVACOR) 40 MG tablet TAKE 1 TABLET BY MOUTH TWICE DAILY (GENERIC FOR MEVACOR)  360 tablet  3  . metoprolol succinate (TOPROL-XL) 50 MG 24 hr tablet TAKE 1 TABLET BY MOUTH DAILY. TAKE WITH OR IMMEDIATELY FOLLOWING A MEAL. (GENERIC FOR TOPROL XL)  90 tablet  3  . temazepam (RESTORIL) 30 MG capsule Take 1 capsule (30 mg total) by mouth at bedtime as needed. For sleep  90 capsule  1   No current facility-administered medications for this visit.      Physical Exam:   BP 156/83  Pulse 59  Resp 16  Ht 5\' 6"  (1.676 m)  Wt 185 lb (83.915 kg)  BMI 29.87 kg/m2  SpO2 99%  General:  Well-appearing  Chest:   clear  CV:   Regular rate and rhythm without murmur  Incisions:  Completely healed  Abdomen:  Soft nontender  Extremities:  Warm and well-perfused  Diagnostic Tests:  2 channel telemetry rhythm strip demonstrates normal sinus rhythm   Impression:  Patient is doing very well and maintaining sinus rhythm now 2 years status post Maze procedure and coronary artery bypass grafting x1. The patient describes stable symptoms of exertional shortness of breath and chest discomfort which are reportedly  better than they were prior to surgery. He has not had a stress test performed since surgery, but at present his activity level is quite good.  Plan:  Patient will continue to followup with Dr. Lovena [name] and Dr. Rayann [name]. We will make sure that he is seen by Scott [name] in the atrial fibrillation clinic on an annual basis. In the future he will call and return to see [place] here as needed. All of his questions been addressed.  I spent in excess of 15 minutes during the conduct of this office consultation and >50% of this time involved direct face-to-face encounter with the patient for counseling and/or coordination of their care.   Scott [name]. Scott Manns, MD 11/14/2013 12:36 PM

## 2013-11-17 ENCOUNTER — Encounter: Payer: Self-pay | Admitting: Internal Medicine

## 2013-11-18 ENCOUNTER — Encounter: Payer: Self-pay | Admitting: Internal Medicine

## 2013-11-18 ENCOUNTER — Ambulatory Visit (INDEPENDENT_AMBULATORY_CARE_PROVIDER_SITE_OTHER): Payer: Self-pay | Admitting: *Deleted

## 2013-11-18 DIAGNOSIS — I4891 Unspecified atrial fibrillation: Secondary | ICD-10-CM

## 2013-11-18 DIAGNOSIS — I480 Paroxysmal atrial fibrillation: Secondary | ICD-10-CM

## 2013-12-02 LAB — MDC_IDC_ENUM_SESS_TYPE_REMOTE
Date Time Interrogation Session: 20150930114651
MDC IDC SET ZONE DETECTION INTERVAL: 3000 ms
MDC IDC SET ZONE DETECTION INTERVAL: 400 ms
Zone Setting Detection Interval: 2000 ms

## 2013-12-02 NOTE — Progress Notes (Signed)
Loop recorder 

## 2013-12-05 ENCOUNTER — Telehealth: Payer: Self-pay | Admitting: Internal Medicine

## 2013-12-05 NOTE — Telephone Encounter (Signed)
Symptom episode was printed out and put in folder for GT to review/kwm

## 2013-12-05 NOTE — Telephone Encounter (Signed)
New message     Heart went out of rhythum sat for about an hour.  His bp last night was 153/92 pulse 94---but he is back in rhythum.  Pt has a loop recorder and he recorded the AFIB. Please advise

## 2013-12-09 ENCOUNTER — Other Ambulatory Visit: Payer: Self-pay

## 2013-12-13 ENCOUNTER — Other Ambulatory Visit (INDEPENDENT_AMBULATORY_CARE_PROVIDER_SITE_OTHER): Payer: Medicaid Other

## 2013-12-13 ENCOUNTER — Ambulatory Visit (INDEPENDENT_AMBULATORY_CARE_PROVIDER_SITE_OTHER): Payer: Self-pay | Admitting: Internal Medicine

## 2013-12-13 ENCOUNTER — Encounter: Payer: Self-pay | Admitting: Internal Medicine

## 2013-12-13 VITALS — BP 148/90 | HR 87 | Temp 98.6°F | Wt 189.1 lb

## 2013-12-13 DIAGNOSIS — N320 Bladder-neck obstruction: Secondary | ICD-10-CM

## 2013-12-13 DIAGNOSIS — E785 Hyperlipidemia, unspecified: Secondary | ICD-10-CM

## 2013-12-13 DIAGNOSIS — I10 Essential (primary) hypertension: Secondary | ICD-10-CM

## 2013-12-13 DIAGNOSIS — M1711 Unilateral primary osteoarthritis, right knee: Secondary | ICD-10-CM

## 2013-12-13 LAB — HEPATIC FUNCTION PANEL
ALBUMIN: 3.5 g/dL (ref 3.5–5.2)
ALT: 19 U/L (ref 0–53)
AST: 27 U/L (ref 0–37)
Alkaline Phosphatase: 60 U/L (ref 39–117)
Bilirubin, Direct: 0.1 mg/dL (ref 0.0–0.3)
Total Bilirubin: 1 mg/dL (ref 0.2–1.2)
Total Protein: 7.4 g/dL (ref 6.0–8.3)

## 2013-12-13 LAB — CBC WITH DIFFERENTIAL/PLATELET
Basophils Absolute: 0 10*3/uL (ref 0.0–0.1)
Basophils Relative: 0.4 % (ref 0.0–3.0)
EOS PCT: 3.6 % (ref 0.0–5.0)
Eosinophils Absolute: 0.2 10*3/uL (ref 0.0–0.7)
HEMATOCRIT: 41.2 % (ref 39.0–52.0)
Hemoglobin: 13.5 g/dL (ref 13.0–17.0)
LYMPHS ABS: 1.1 10*3/uL (ref 0.7–4.0)
Lymphocytes Relative: 16.7 % (ref 12.0–46.0)
MCHC: 32.8 g/dL (ref 30.0–36.0)
MCV: 85.3 fl (ref 78.0–100.0)
MONOS PCT: 10.1 % (ref 3.0–12.0)
Monocytes Absolute: 0.7 10*3/uL (ref 0.1–1.0)
NEUTROS PCT: 69.2 % (ref 43.0–77.0)
Neutro Abs: 4.7 10*3/uL (ref 1.4–7.7)
Platelets: 185 10*3/uL (ref 150.0–400.0)
RBC: 4.83 Mil/uL (ref 4.22–5.81)
RDW: 14.3 % (ref 11.5–15.5)
WBC: 6.8 10*3/uL (ref 4.0–10.5)

## 2013-12-13 LAB — URINALYSIS, ROUTINE W REFLEX MICROSCOPIC
Bilirubin Urine: NEGATIVE
Hgb urine dipstick: NEGATIVE
Ketones, ur: NEGATIVE
LEUKOCYTES UA: NEGATIVE
NITRITE: NEGATIVE
PH: 5.5 (ref 5.0–8.0)
Total Protein, Urine: NEGATIVE
Urine Glucose: NEGATIVE
Urobilinogen, UA: 0.2 (ref 0.0–1.0)

## 2013-12-13 LAB — LIPID PANEL
CHOL/HDL RATIO: 3
Cholesterol: 156 mg/dL (ref 0–200)
HDL: 48.4 mg/dL (ref >39.00)
LDL Cholesterol: 99 mg/dL (ref 0–99)
NONHDL: 107.6
TRIGLYCERIDES: 43 mg/dL (ref 0.0–149.0)
VLDL: 8.6 mg/dL (ref 0.0–40.0)

## 2013-12-13 LAB — BASIC METABOLIC PANEL
BUN: 17 mg/dL (ref 6–23)
CHLORIDE: 107 meq/L (ref 96–112)
CO2: 29 mEq/L (ref 19–32)
Calcium: 9.2 mg/dL (ref 8.4–10.5)
Creatinine, Ser: 1 mg/dL (ref 0.4–1.5)
GFR: 85.3 mL/min (ref >60.00)
Glucose, Bld: 85 mg/dL (ref 70–99)
POTASSIUM: 4.3 meq/L (ref 3.5–5.1)
SODIUM: 141 meq/L (ref 135–145)

## 2013-12-13 LAB — TSH: TSH: 2.16 u[IU]/mL (ref 0.35–4.50)

## 2013-12-13 LAB — PSA: PSA: 0.79 ng/mL (ref 0.10–4.00)

## 2013-12-13 MED ORDER — VALSARTAN 320 MG PO TABS: 320.0000 mg | ORAL_TABLET | Freq: Every day | ORAL | Status: DC

## 2013-12-13 MED ORDER — TRAMADOL HCL 50 MG PO TABS: 50.0000 mg | ORAL_TABLET | Freq: Three times a day (TID) | ORAL | Status: DC | PRN

## 2013-12-13 MED ORDER — ENALAPRIL MALEATE 10 MG PO TABS: 10.0000 mg | ORAL_TABLET | Freq: Every day | ORAL | Status: DC

## 2013-12-13 NOTE — Progress Notes (Signed)
Pre visit review using our clinic review tool, if applicable. No additional management support is needed unless otherwise documented below in the visit note. 

## 2013-12-13 NOTE — Progress Notes (Signed)
Subjective:    Patient ID: Scott [name], male    DOB: February 01, 1956, 58 y.o.   MRN: [id number]  HPI  Here to f/u; overall doing ok, but BP has been mild elevated on current meds in the 140-150 sbp range.  Pt denies chest pain, increased sob or doe, wheezing, orthopnea, PND, increased LE swelling, palpitations, dizziness or syncope.  Pt denies polydipsia, polyuria, or low sugar symptoms such as weakness or confusion improved with po intake.  Pt denies new neurological symptoms such as new headache, or facial or extremity weakness or numbness.   Pt states overall good compliance with meds, has been trying to follow lower cholesterol, diabetic diet, with wt overall stable,  but little exercise however, due to ongoing right > left knee pain, occas swelling., no giveaways or falls.  Needs tramadol refill, overall pain o/w controlled Past Medical History  Diagnosis Date  . HYPOTHYROIDISM, ACQUIRED NEC 09/28/2006  . HYPERLIPIDEMIA 09/28/2006  . ANXIETY 09/28/2006  . OBSTRUCTIVE SLEEP APNEA 01/06/2008  . HYPERTENSION 09/28/2006  . Atrial fibrillation 09/28/2006    s/p afib ablation x 2  . Diastolic dysfunction 04/04/9240  . ALLERGIC RHINITIS 01/14/2007  . Long term current use of anticoagulant 04/08/2010  . Right knee DJD 07/09/2010  . Asbestos exposure   . Coronary artery disease 11/24/2011  . S/P CABG x 1 11/26/2011    Right internal mammary artery to right coronary artery  . S/P Maze operation for atrial fibrillation 11/26/2011    complete biatrial lesion set with clipping of LA appendage   Past Surgical History  Procedure Laterality Date  . Hand surgury      left  . Radiofrequency ablation for atrial flutter  2005    CTI  . Radiofrequency ablation for atrial fibrillation  12/30/07 and 06/05/09    afib ablation x 2 by JA  . Maze  11/26/2011    Procedure: MAZE;  Surgeon: Rexene Alberts, MD;  Location: [facility];  Service: Open Heart Surgery;  Laterality: N/A;  Cryomaze   . Coronary artery bypass graft   11/26/2011    Procedure: CORONARY ARTERY BYPASS GRAFTING (CABG);  Surgeon: Rexene Alberts, MD;  Location: [facility];  Service: Open Heart Surgery;  Laterality: N/A;  coronary artery bypass on pump times one utilizing the right internal mammary artery, transesophageal echocardiogram   . Implantable loop recorder  07/06/12    Linq implanted by Dr Rayann Heman    reports that he quit smoking about 18 years ago. His smoking use included Cigarettes. He has a 39 pack-year smoking history. He does not have any smokeless tobacco history on file. He reports that he does not drink alcohol or use illicit drugs. family history includes Atrial fibrillation in his mother; Dementia in his father; Hypertension in his father; Stroke in his mother. Allergies  Allergen Reactions  . Zolpidem Tartrate Other (See Comments)    Extremely drowsy the next day   Current Outpatient Prescriptions on File Prior to Visit  Medication Sig Dispense Refill  . acetaminophen (TYLENOL) 500 MG tablet Take 500 mg by mouth every 6 (six) hours as needed for mild pain.       Marland Kitchen ALPRAZolam (XANAX) 0.5 MG tablet Take 1 tablet (0.5 mg total) by mouth 2 (two) times daily as needed. For anxiety  180 tablet  1  . aspirin EC 81 MG EC tablet Take 1 tablet (81 mg total) by mouth daily.      . budesonide-formoterol (SYMBICORT) 160-4.5 MCG/ACT inhaler Inhale  2 puffs into the lungs 2 (two) times daily.  3 Inhaler  3  . Edoxaban Tosylate (SAVAYSA) 60 MG TABS Take 60 mg by mouth daily.  30 tablet  11  . temazepam (RESTORIL) 30 MG capsule Take 1 capsule (30 mg total) by mouth at bedtime as needed. For sleep  90 capsule  1   No current facility-administered medications on file prior to visit.   Review of Systems  Constitutional: Negative for unusual diaphoresis or other sweats  HENT: Negative for ringing in ear Eyes: Negative for double vision or worsening visual disturbance.  Respiratory: Negative for choking and stridor.   Gastrointestinal: Negative for  vomiting or other signifcant bowel change Genitourinary: Negative for hematuria or decreased urine volume.  Musculoskeletal: Negative for other MSK pain or swelling Skin: Negative for color change and worsening wound.  Neurological: Negative for tremors and numbness other than noted  Psychiatric/Behavioral: Negative for decreased concentration or agitation other than above       Objective:   Physical Exam BP 148/90  Pulse 87  Temp(Src) 98.6 F (37 C) (Oral)  Wt 189 lb 2 oz (85.787 kg)  SpO2 99% VS noted,  Constitutional: Pt appears well-developed, well-nourished.  HENT: Head: NCAT.  Right Ear: External ear normal.  Left Ear: External ear normal.  Eyes: . Pupils are equal, round, and reactive to light. Conjunctivae and EOM are normal Neck: Normal range of motion. Neck supple.  Cardiovascular: Normal rate and regular rhythm.   Pulmonary/Chest: Effort normal and breath sounds normal.  Abd:  Soft, NT, ND, + BS Neurological: Pt is alert. Not confused , motor grossly intact Skin: Skin is warm. No rash Psychiatric: Pt behavior is normal. No agitation.     Assessment & Plan:

## 2013-12-13 NOTE — Patient Instructions (Addendum)
OK to stop the vasotec (enalapril)  Please take all new medication as prescribed - the valsartan 320 mg per day  Please continue all other medications as before, and refills have been done if requested - the tramadol  Please have the pharmacy call with any other refills you may need.  Please keep your appointments with your specialists as you may have planned  Please go to the LAB in the Basement (turn left off the elevator) for the tests to be done today  You will be contacted by phone if any changes need to be made immediately.  Otherwise, you will receive a letter about your results with an explanation, but please check with MyChart first.  Please remember to sign up for MyChart if you have not done so, as this will be important to you in the future with finding out test results, communicating by private email, and scheduling acute appointments online when needed.  Please return in 3 months, or sooner if needed

## 2013-12-14 ENCOUNTER — Telehealth: Payer: Self-pay | Admitting: Internal Medicine

## 2013-12-14 ENCOUNTER — Emergency Department (HOSPITAL_COMMUNITY): Payer: Medicaid Other

## 2013-12-14 ENCOUNTER — Emergency Department (HOSPITAL_COMMUNITY)
Admission: EM | Admit: 2013-12-14 | Discharge: 2013-12-14 | Disposition: A | Discharge status: Home / Self Care | Payer: Medicaid Other | Attending: Emergency Medicine | Admitting: Emergency Medicine

## 2013-12-14 ENCOUNTER — Encounter (HOSPITAL_COMMUNITY): Payer: Self-pay | Admitting: Emergency Medicine

## 2013-12-14 DIAGNOSIS — Z8669 Personal history of other diseases of the nervous system and sense organs: Secondary | ICD-10-CM | POA: Insufficient documentation

## 2013-12-14 DIAGNOSIS — I4891 Unspecified atrial fibrillation: Secondary | ICD-10-CM | POA: Insufficient documentation

## 2013-12-14 DIAGNOSIS — K828 Other specified diseases of gallbladder: Secondary | ICD-10-CM | POA: Diagnosis not present

## 2013-12-14 DIAGNOSIS — Z79899 Other long term (current) drug therapy: Secondary | ICD-10-CM | POA: Insufficient documentation

## 2013-12-14 DIAGNOSIS — F419 Anxiety disorder, unspecified: Secondary | ICD-10-CM | POA: Insufficient documentation

## 2013-12-14 DIAGNOSIS — R0789 Other chest pain: Secondary | ICD-10-CM | POA: Insufficient documentation

## 2013-12-14 DIAGNOSIS — Z951 Presence of aortocoronary bypass graft: Secondary | ICD-10-CM | POA: Diagnosis not present

## 2013-12-14 DIAGNOSIS — Z7901 Long term (current) use of anticoagulants: Secondary | ICD-10-CM | POA: Diagnosis not present

## 2013-12-14 DIAGNOSIS — E785 Hyperlipidemia, unspecified: Secondary | ICD-10-CM | POA: Diagnosis not present

## 2013-12-14 DIAGNOSIS — Z7982 Long term (current) use of aspirin: Secondary | ICD-10-CM | POA: Diagnosis not present

## 2013-12-14 DIAGNOSIS — R079 Chest pain, unspecified: Secondary | ICD-10-CM | POA: Diagnosis present

## 2013-12-14 DIAGNOSIS — Z7951 Long term (current) use of inhaled steroids: Secondary | ICD-10-CM | POA: Insufficient documentation

## 2013-12-14 DIAGNOSIS — I2510 Atherosclerotic heart disease of native coronary artery without angina pectoris: Secondary | ICD-10-CM | POA: Insufficient documentation

## 2013-12-14 DIAGNOSIS — I10 Essential (primary) hypertension: Secondary | ICD-10-CM | POA: Insufficient documentation

## 2013-12-14 DIAGNOSIS — Z9889 Other specified postprocedural states: Secondary | ICD-10-CM | POA: Insufficient documentation

## 2013-12-14 DIAGNOSIS — M179 Osteoarthritis of knee, unspecified: Secondary | ICD-10-CM | POA: Diagnosis not present

## 2013-12-14 DIAGNOSIS — Z87891 Personal history of nicotine dependence: Secondary | ICD-10-CM | POA: Diagnosis not present

## 2013-12-14 LAB — COMPREHENSIVE METABOLIC PANEL
ALT: 41 U/L (ref 0–53)
AST: 72 U/L — AB (ref 0–37)
Albumin: 4.1 g/dL (ref 3.5–5.2)
Alkaline Phosphatase: 93 U/L (ref 39–117)
Anion gap: 13 (ref 5–15)
BUN: 17 mg/dL (ref 6–23)
CHLORIDE: 104 meq/L (ref 96–112)
CO2: 24 mEq/L (ref 19–32)
Calcium: 9.6 mg/dL (ref 8.4–10.5)
Creatinine, Ser: 1.03 mg/dL (ref 0.50–1.35)
GFR calc Af Amer: >90 mL/min (ref >90)
GFR calc non Af Amer: 78 mL/min — ABNORMAL LOW (ref >90)
Glucose, Bld: 159 mg/dL — ABNORMAL HIGH (ref 70–99)
Potassium: 4.1 mEq/L (ref 3.7–5.3)
Sodium: 141 mEq/L (ref 137–147)
Total Bilirubin: 1.8 mg/dL — ABNORMAL HIGH (ref 0.3–1.2)
Total Protein: 8.2 g/dL (ref 6.0–8.3)

## 2013-12-14 LAB — I-STAT TROPONIN, ED
TROPONIN I, POC: 0.01 ng/mL (ref 0.00–0.08)
Troponin i, poc: 0.01 ng/mL (ref 0.00–0.08)

## 2013-12-14 LAB — CBC WITH DIFFERENTIAL/PLATELET
BASOS ABS: 0 10*3/uL (ref 0.0–0.1)
Basophils Relative: 0 % (ref 0–1)
Eosinophils Absolute: 0 10*3/uL (ref 0.0–0.7)
Eosinophils Relative: 0 % (ref 0–5)
HEMATOCRIT: 42.6 % (ref 39.0–52.0)
Hemoglobin: 13.9 g/dL (ref 13.0–17.0)
LYMPHS PCT: 7 % — AB (ref 12–46)
Lymphs Abs: 0.7 10*3/uL (ref 0.7–4.0)
MCH: 28.8 pg (ref 26.0–34.0)
MCHC: 32.6 g/dL (ref 30.0–36.0)
MCV: 88.4 fL (ref 78.0–100.0)
MONO ABS: 0.6 10*3/uL (ref 0.1–1.0)
Monocytes Relative: 6 % (ref 3–12)
NEUTROS ABS: 8.6 10*3/uL — AB (ref 1.7–7.7)
Neutrophils Relative %: 87 % — ABNORMAL HIGH (ref 43–77)
PLATELETS: 196 10*3/uL (ref 150–400)
RBC: 4.82 MIL/uL (ref 4.22–5.81)
RDW: 13.5 % (ref 11.5–15.5)
WBC: 9.9 10*3/uL (ref 4.0–10.5)

## 2013-12-14 LAB — APTT: aPTT: 31 seconds (ref 24–37)

## 2013-12-14 LAB — LIPASE, BLOOD: Lipase: 35 U/L (ref 11–59)

## 2013-12-14 LAB — PROTIME-INR
INR: 1.64 — ABNORMAL HIGH (ref 0.00–1.49)
Prothrombin Time: 19.6 seconds — ABNORMAL HIGH (ref 11.6–15.2)

## 2013-12-14 LAB — D-DIMER, QUANTITATIVE: D-Dimer, Quant: <0.27 ug/mL-FEU (ref 0.00–0.48)

## 2013-12-14 MED ORDER — METHOCARBAMOL 500 MG PO TABS
1000.0000 mg | ORAL_TABLET | Freq: Once | ORAL | Status: AC
  Administered 2013-12-14: 1000 mg via ORAL
  Filled 2013-12-14: qty 2

## 2013-12-14 MED ORDER — HYDROCODONE-ACETAMINOPHEN 5-325 MG PO TABS: 2.0000 | ORAL_TABLET | ORAL | Status: DC | PRN

## 2013-12-14 MED ORDER — KETOROLAC TROMETHAMINE 30 MG/ML IJ SOLN
30.0000 mg | Freq: Once | INTRAMUSCULAR | Status: AC
  Administered 2013-12-14: 30 mg via INTRAVENOUS
  Filled 2013-12-14: qty 1

## 2013-12-14 MED ORDER — ONDANSETRON 4 MG PO TBDP: ORAL_TABLET | ORAL | Status: DC

## 2013-12-14 NOTE — Telephone Encounter (Signed)
emmi emailed °

## 2013-12-14 NOTE — ED Provider Notes (Signed)
CSN: [id number]     Arrival date & time 12/14/13  0000 History   First MD Initiated Contact with Patient 12/14/13 0033     Chief Complaint  Patient presents with  . Chest Pain     (Consider location/radiation/quality/duration/timing/severity/associated sxs/prior Treatment) HPI Patient presents with bilateral lower anterior chest wall pain that started around 9 this evening. Pain started while watching television. Not associated with nausea vomiting. Pain is worse with movement and palpation. Pain is worse with deep inspiration. No definite increasing shortness of breath. Minimal cough. No lower extremity swelling or pain. No extended travel recently or surgeries. He is mildly improved while in the emergency department. Past Medical History  Diagnosis Date  . HYPOTHYROIDISM, ACQUIRED NEC 09/28/2006  . HYPERLIPIDEMIA 09/28/2006  . ANXIETY 09/28/2006  . OBSTRUCTIVE SLEEP APNEA 01/06/2008  . HYPERTENSION 09/28/2006  . Atrial fibrillation 09/28/2006    s/p afib ablation x 2  . Diastolic dysfunction 04/26/6710  . ALLERGIC RHINITIS 01/14/2007  . Long term current use of anticoagulant 04/08/2010  . Right knee DJD 07/09/2010  . Asbestos exposure   . Coronary artery disease 11/24/2011  . S/P CABG x 1 11/26/2011    Right internal mammary artery to right coronary artery  . S/P Maze operation for atrial fibrillation 11/26/2011    complete biatrial lesion set with clipping of LA appendage   Past Surgical History  Procedure Laterality Date  . Hand surgury      left  . Radiofrequency ablation for atrial flutter  2005    CTI  . Radiofrequency ablation for atrial fibrillation  12/30/07 and 06/05/09    afib ablation x 2 by JA  . Maze  11/26/2011    Procedure: MAZE;  Surgeon: Rexene Alberts, MD;  Location: [facility];  Service: Open Heart Surgery;  Laterality: N/A;  Cryomaze   . Coronary artery bypass graft  11/26/2011    Procedure: CORONARY ARTERY BYPASS GRAFTING (CABG);  Surgeon: Rexene Alberts, MD;  Location: [facility];  Service: Open Heart Surgery;  Laterality: N/A;  coronary artery bypass on pump times one utilizing the right internal mammary artery, transesophageal echocardiogram   . Implantable loop recorder  07/06/12    Linq implanted by Dr Rayann Heman   Family History  Problem Relation Age of Onset  . Dementia Father   . Hypertension Father   . Atrial fibrillation Mother   . Stroke Mother    History  Substance Use Topics  . Smoking status: Former Smoker -- 1.50 packs/day for 26 years    Types: Cigarettes    Quit date: 04/06/1995  . Smokeless tobacco: Not on file     Comment: not smoked over 10 years  . Alcohol Use: No    Review of Systems  Constitutional: Negative for fever and chills.  Respiratory: Negative for cough and shortness of breath.   Cardiovascular: Positive for chest pain. Negative for palpitations and leg swelling.  Gastrointestinal: Negative for nausea, vomiting and abdominal pain.  Musculoskeletal: Negative for back pain, neck pain and neck stiffness.  Skin: Negative for rash and wound.  Neurological: Negative for dizziness, weakness, light-headedness, numbness and headaches.  All other systems reviewed and are negative.     Allergies  Zolpidem tartrate  Home Medications   Prior to Admission medications   Medication Sig Start Date End Date Taking? Authorizing Provider  acetaminophen (TYLENOL) 500 MG tablet Take 500 mg by mouth every 6 (six) hours as needed for mild pain.    Yes Historical Provider,  MD  ALPRAZolam (XANAX) 0.5 MG tablet Take 1 tablet (0.5 mg total) by mouth 2 (two) times daily as needed. For anxiety 10/25/13 10/25/14 Yes [name], MD  aspirin EC 81 MG EC tablet Take 1 tablet (81 mg total) by mouth daily. 12/02/11  Yes Donielle Liston Alba, PA-C  budesonide-formoterol (SYMBICORT) 160-4.5 MCG/ACT inhaler Inhale 2 puffs into the lungs 2 (two) times daily. 07/28/13  Yes [name], MD  Edoxaban Tosylate (SAVAYSA) 60 MG TABS Take 60 mg by mouth daily. 08/10/13   Yes Evans Lance, MD  lovastatin (MEVACOR) 40 MG tablet Take 80 mg by mouth every evening.   Yes Historical Provider, MD  metoprolol succinate (TOPROL-XL) 50 MG 24 hr tablet Take 50 mg by mouth daily. Take with or immediately following a meal.   Yes Historical Provider, MD  temazepam (RESTORIL) 30 MG capsule Take 1 capsule (30 mg total) by mouth at bedtime as needed. For sleep 10/25/13  Yes [name], MD  traMADol (ULTRAM) 50 MG tablet Take 50 mg by mouth every 8 (eight) hours as needed for moderate pain.   Yes Historical Provider, MD   BP 167/80  Pulse 70  Temp(Src) 98.1 F (36.7 C)  Resp 16  SpO2 98% Physical Exam  Nursing note and vitals reviewed. Constitutional: He is oriented to person, place, and time. He appears well-developed and well-nourished. No distress.  HENT:  Head: Normocephalic and atraumatic.  Mouth/Throat: Oropharynx is clear and moist.  Eyes: EOM are normal. Pupils are equal, round, and reactive to light.  Neck: Normal range of motion. Neck supple.  Cardiovascular: Normal rate and regular rhythm.   Pulmonary/Chest: Effort normal. No respiratory distress. He has no wheezes. He has no rales. He exhibits tenderness (exquisitely tender bilateral, inferior anterior ribs. No crepitance or deformity.).  Abdominal: Soft. Bowel sounds are normal. He exhibits no distension and no mass. There is no tenderness. There is no rebound and no guarding.  Musculoskeletal: Normal range of motion. He exhibits no edema and no tenderness.  No calf swelling or tenderness.  Neurological: He is alert and oriented to person, place, and time.  Moves all extremities without deficit. Sensation is grossly intact.  Skin: Skin is warm and dry. No rash noted. No erythema.  Psychiatric: He has a normal mood and affect. His behavior is normal.    ED Course  Procedures (including critical care time) Labs Review Labs Reviewed  CBC WITH DIFFERENTIAL - Abnormal; Notable for the following:     Neutrophils Relative % 87 (*)    Neutro Abs 8.6 (*)    Lymphocytes Relative 7 (*)    All other components within normal limits  COMPREHENSIVE METABOLIC PANEL - Abnormal; Notable for the following:    Glucose, Bld 159 (*)    AST 72 (*)    Total Bilirubin 1.8 (*)    GFR calc non Af Amer 78 (*)    All other components within normal limits  PROTIME-INR - Abnormal; Notable for the following:    Prothrombin Time 19.6 (*)    INR 1.64 (*)    All other components within normal limits  LIPASE, BLOOD  D-DIMER, QUANTITATIVE  APTT  I-STAT TROPOININ, ED  Randolm Idol, ED    Imaging Review Dg Chest 2 View  12/14/2013   CLINICAL DATA:  Initial evaluation for acute chest pain. Shortness of breath.  EXAM: CHEST  2 VIEW  COMPARISON:  Prior radiograph from 09/01/2012  FINDINGS: Left atrial appendage clip with loop  recorder device again seen overlying the left heart border, stable. Median sternotomy wires are unchanged. Mild cardiomegaly is stable. Mediastinal silhouette within normal limits.  The lungs are normally inflated. No airspace consolidation, pleural effusion, or pulmonary edema is identified. There is no pneumothorax.  No acute osseous abnormality identified.  IMPRESSION: No active cardiopulmonary disease.   Electronically Signed   By: Jeannine Boga M.D.   On: 12/14/2013 00:37   US Abdomen Complete  12/14/2013   CLINICAL DATA:  Upper abdominal pain in the left upper quadrant.  EXAM: ULTRASOUND ABDOMEN COMPLETE  COMPARISON:  CT abdomen and pelvis 12/07/2011  FINDINGS: Gallbladder: Suggestion of mild layering echogenic material in the gallbladder likely representing small amount of sludge. No discrete stones identified. No wall thickening. Murphy's sign is negative.  Common bile duct: Diameter: 8.7 mm, mildly dilated.  Liver: Mild central intrahepatic bile duct dilatation.  IVC: No abnormality visualized.  Pancreas: Visualized portion unremarkable.  Spleen: Size and appearance within  normal limits.  Right Kidney: Length: 10.8 cm. Echogenicity within normal limits. No mass or hydronephrosis visualized.  Left Kidney: Length: 11.9 cm. Echogenicity within normal limits. No mass or hydronephrosis visualized.  Abdominal aorta: No aneurysm visualized.  Other findings: None.  IMPRESSION: Minimal sludge in the gallbladder without additional inflammatory signs. No gallstones identified. Mild intra and extrahepatic bile duct dilatation. No cause identified. Recommend correlation with liver function studies.   Electronically Signed   By: Lucienne Capers M.D.   On: 12/14/2013 06:58     EKG Interpretation None      MDM   Final diagnoses:  None    No evidence of cholecystitis on [place]. Troponins x2 negative. EKG without acute changes. Patient's symptoms have improved with pain medication. Is mildly enlarged common bile duct. Will get GI followup. About return for worsening pain, persistent vomiting, fever or any concerns. Patient agrees with plan.    Julianne Rice, MD 12/14/13 [id number]

## 2013-12-14 NOTE — ED Notes (Signed)
Pt states after receiving pain meds he started having left sided abdominal pain 9/10 intermit; pt states pain 5/10

## 2013-12-14 NOTE — Discharge Instructions (Signed)
Cholelithiasis  Cholelithiasis (also called gallstones) is a form of gallbladder disease in which gallstones form in your gallbladder. The gallbladder is an organ that stores bile made in the liver, which helps digest fats. Gallstones begin as small crystals and slowly grow into stones. Gallstone pain occurs when the gallbladder spasms and a gallstone is blocking the duct. Pain can also occur when a stone passes out of the duct.   RISK FACTORS   Being male.    Having multiple pregnancies. Health care providers sometimes advise removing diseased gallbladders before future pregnancies.    Being obese.   Eating a diet heavy in fried foods and fat.    Being older than 60 years and increasing age.    Prolonged use of medicines containing male hormones.    Having diabetes mellitus.    Rapidly losing weight.    Having a family history of gallstones (heredity).   SYMPTOMS   Nausea.    Vomiting.   Abdominal pain.    Yellowing of the skin (jaundice).    Sudden pain. It may persist from several minutes to several hours.   Fever.    Tenderness to the touch.  In some cases, when gallstones do not move into the bile duct, people have no pain or symptoms. These are called "silent" gallstones.   TREATMENT  Silent gallstones do not need treatment. In severe cases, emergency surgery may be required. Options for treatment include:   Surgery to remove the gallbladder. This is the most common treatment.   Medicines. These do not always work and may take 6-12 months or more to work.   Shock wave treatment (extracorporeal biliary lithotripsy). In this treatment an ultrasound machine sends shock waves to the gallbladder to break gallstones into smaller pieces that can pass into the intestines or be dissolved by medicine.  HOME CARE INSTRUCTIONS    Only take over-the-counter or prescription medicines for pain, discomfort, or fever as directed by your health care provider.    Follow a low-fat diet until  seen again by your health care provider. Fat causes the gallbladder to contract, which can result in pain.    Follow up with your health care provider as directed. Attacks are almost always recurrent and surgery is usually required for permanent treatment.   SEEK IMMEDIATE MEDICAL CARE IF:    Your pain increases and is not controlled by medicines.    You have a fever or persistent symptoms for more than 2-3 days.    You have a fever and your symptoms suddenly get worse.    You have persistent nausea and vomiting.   MAKE SURE YOU:    Understand these instructions.   Will watch your condition.   Will get help right away if you are not doing well or get worse.  Document Released: 02/06/2005 Document Revised: 10/13/2012 Document Reviewed: 08/04/2012  ExitCare Patient Information 2015 [employer]. This information is not intended to replace advice given to you by your health care provider. Make sure you discuss any questions you have with your health care provider.    Low-Fat Diet for Pancreatitis or Gallbladder Conditions  A low-fat diet can be helpful if you have pancreatitis or a gallbladder condition. With these conditions, your pancreas and gallbladder have trouble digesting fats. A healthy eating plan with less fat will help rest your pancreas and gallbladder and reduce your symptoms.  WHAT DO I NEED TO KNOW ABOUT THIS DIET?   Eat a low-fat diet.   Reduce your   fat intake to less than 20-30% of your total daily calories. This is less than 50-60 g of fat per day.   Remember that you need some fat in your diet. Ask your dietician what your daily goal should be.   Choose nonfat and low-fat healthy foods. Look for the words "nonfat," "low fat," or "fat free."   As a guide, look on the label and choose foods with less than 3 g of fat per serving. Eat only one serving.   Avoid alcohol.   Do not smoke. If you need help quitting, talk with your health care provider.   Eat small frequent meals  instead of three large heavy meals.  WHAT FOODS CAN I EAT?  Grains  Include healthy grains and starches such as potatoes, wheat bread, fiber-rich cereal, and brown rice. Choose whole grain options whenever possible. In adults, whole grains should account for 45-65% of your daily calories.   Fruits and Vegetables  Eat plenty of fruits and vegetables. Fresh fruits and vegetables add fiber to your diet.  Meats and Other Protein Sources  Eat lean meat such as chicken and pork. Trim any fat off of meat before cooking it. Eggs, fish, and beans are other sources of protein. In adults, these foods should account for 10-35% of your daily calories.  Dairy  Choose low-fat milk and dairy options. Dairy includes fat and protein, as well as calcium.   Fats and Oils  Limit high-fat foods such as fried foods, sweets, baked goods, sugary drinks.   Other  Creamy sauces and condiments, such as mayonnaise, can add extra fat. Think about whether or not you need to use them, or use smaller amounts or low fat options.  WHAT FOODS ARE NOT RECOMMENDED?   High fat foods, such as:   Baked goods.   Ice cream.   French toast.   Sweet rolls.   Pizza.   Cheese bread.   Foods covered with batter, butter, creamy sauces, or cheese.   Fried foods.   Sugary drinks and desserts.   Foods that cause gas or bloating  Document Released: 02/15/2013 Document Reviewed: 02/15/2013  [employer] Patient Information 2015 [employer]. This information is not intended to replace advice given to you by your health care provider. Make sure you discuss any questions you have with your health care provider.

## 2013-12-14 NOTE — ED Notes (Addendum)
Pt presents with central chest pain described as sharp- pt reports pain started about 930pm, pt also c/o of epigastric pain that radiates into his back.  Pt appears short of breath, lung sounds clear.  Pt admits to nausea, actively vomiting in triage.  Denies medication at home.  Pt has cardiac hx.

## 2013-12-14 NOTE — ED Notes (Signed)
Patient transported to Ultrasound 

## 2013-12-15 ENCOUNTER — Telehealth: Payer: Self-pay | Admitting: Internal Medicine

## 2013-12-15 NOTE — Telephone Encounter (Signed)
[facility] states [facility] not receive scripts for diovan and tramadol.  Please advise Juanda Crumble.

## 2013-12-15 NOTE — Telephone Encounter (Signed)
Called the patient left message to call back 

## 2013-12-15 NOTE — Telephone Encounter (Signed)
Patient called back.  States pharmacy has received scripts

## 2013-12-19 ENCOUNTER — Ambulatory Visit (INDEPENDENT_AMBULATORY_CARE_PROVIDER_SITE_OTHER): Payer: Self-pay | Admitting: *Deleted

## 2013-12-19 DIAGNOSIS — N320 Bladder-neck obstruction: Secondary | ICD-10-CM | POA: Insufficient documentation

## 2013-12-19 DIAGNOSIS — I480 Paroxysmal atrial fibrillation: Secondary | ICD-10-CM

## 2013-12-19 LAB — MDC_IDC_ENUM_SESS_TYPE_REMOTE
Date Time Interrogation Session: 20151031042002
MDC IDC SET ZONE DETECTION INTERVAL: 400 ms
Zone Setting Detection Interval: 2000 ms
Zone Setting Detection Interval: 3000 ms

## 2013-12-19 NOTE — Assessment & Plan Note (Signed)
Chronic persistent mod to severe, for tramadol prn, consider f/u with ortho but declines now,  to f/u any worsening symptoms or concerns

## 2013-12-19 NOTE — Assessment & Plan Note (Signed)
Also for psa as he is due,  to f/u any worsening symptoms or concerns  

## 2013-12-19 NOTE — Assessment & Plan Note (Addendum)
stable overall by history and exam, recent data reviewed with pt, and pt to continue medical treatment as before,  to f/u any worsening symptoms or concerns Lab Results  Component Value Date   LDLCALC 99 12/13/2013   For f/u labs

## 2013-12-19 NOTE — Assessment & Plan Note (Signed)
Mild uncontrolled, ok to change the vasotec mid dose to the higher dose valsartan, as this particular med is covered by [facility] where he finds less expensive medication access. BP Readings from Last 3 Encounters:  12/14/13 167/80  12/13/13 148/90  11/14/13 156/83

## 2013-12-23 NOTE — Progress Notes (Signed)
Loop recorder 

## 2013-12-29 ENCOUNTER — Encounter: Payer: Self-pay | Admitting: Internal Medicine

## 2014-01-04 ENCOUNTER — Encounter: Payer: Self-pay | Admitting: Internal Medicine

## 2014-01-06 ENCOUNTER — Ambulatory Visit (INDEPENDENT_AMBULATORY_CARE_PROVIDER_SITE_OTHER): Payer: Self-pay | Admitting: *Deleted

## 2014-01-06 DIAGNOSIS — I480 Paroxysmal atrial fibrillation: Secondary | ICD-10-CM

## 2014-01-07 ENCOUNTER — Encounter: Payer: Self-pay | Admitting: Internal Medicine

## 2014-01-25 NOTE — Progress Notes (Signed)
Loop recorder 

## 2014-02-02 ENCOUNTER — Encounter: Payer: Self-pay | Admitting: Internal Medicine

## 2014-02-02 ENCOUNTER — Encounter (HOSPITAL_COMMUNITY): Payer: Self-pay | Admitting: Cardiology

## 2014-02-02 LAB — MDC_IDC_ENUM_SESS_TYPE_REMOTE
MDC IDC SESS DTM: 20151114214118
MDC IDC SET ZONE DETECTION INTERVAL: 400 ms
Zone Setting Detection Interval: 2000 ms
Zone Setting Detection Interval: 3000 ms

## 2014-02-16 ENCOUNTER — Ambulatory Visit (INDEPENDENT_AMBULATORY_CARE_PROVIDER_SITE_OTHER): Payer: Self-pay | Admitting: *Deleted

## 2014-02-16 DIAGNOSIS — I480 Paroxysmal atrial fibrillation: Secondary | ICD-10-CM

## 2014-02-16 LAB — MDC_IDC_ENUM_SESS_TYPE_REMOTE: Date Time Interrogation Session: 20151125050500

## 2014-02-22 NOTE — Progress Notes (Signed)
Loop recorder 

## 2014-02-23 ENCOUNTER — Encounter: Payer: Self-pay | Admitting: Internal Medicine

## 2014-03-17 ENCOUNTER — Ambulatory Visit (INDEPENDENT_AMBULATORY_CARE_PROVIDER_SITE_OTHER): Payer: Self-pay | Admitting: *Deleted

## 2014-03-17 DIAGNOSIS — I480 Paroxysmal atrial fibrillation: Secondary | ICD-10-CM

## 2014-03-17 LAB — MDC_IDC_ENUM_SESS_TYPE_REMOTE: MDC IDC SESS DTM: 20160210050500

## 2014-03-23 NOTE — Progress Notes (Signed)
Loop recorder 

## 2014-03-27 ENCOUNTER — Encounter: Payer: Self-pay | Admitting: Internal Medicine

## 2014-03-28 ENCOUNTER — Telehealth: Payer: Self-pay | Admitting: Internal Medicine

## 2014-03-28 MED ORDER — TRAMADOL HCL 50 MG PO TABS: 50.0000 mg | ORAL_TABLET | Freq: Three times a day (TID) | ORAL | Status: DC | PRN

## 2014-03-28 NOTE — Telephone Encounter (Signed)
Done hardcopy to robin  

## 2014-03-29 NOTE — Telephone Encounter (Signed)
Faxed hardcopy for Tramadol to [facility]

## 2014-04-04 ENCOUNTER — Ambulatory Visit (INDEPENDENT_AMBULATORY_CARE_PROVIDER_SITE_OTHER): Payer: PPO | Admitting: Internal Medicine

## 2014-04-04 ENCOUNTER — Other Ambulatory Visit (INDEPENDENT_AMBULATORY_CARE_PROVIDER_SITE_OTHER): Payer: PPO

## 2014-04-04 ENCOUNTER — Encounter: Payer: Self-pay | Admitting: Internal Medicine

## 2014-04-04 VITALS — BP 146/90 | HR 88 | Temp 97.6°F | Ht 66.0 in | Wt 189.0 lb

## 2014-04-04 DIAGNOSIS — M25561 Pain in right knee: Secondary | ICD-10-CM

## 2014-04-04 DIAGNOSIS — R296 Repeated falls: Secondary | ICD-10-CM

## 2014-04-04 DIAGNOSIS — K921 Melena: Secondary | ICD-10-CM

## 2014-04-04 DIAGNOSIS — Z23 Encounter for immunization: Secondary | ICD-10-CM

## 2014-04-04 DIAGNOSIS — R739 Hyperglycemia, unspecified: Secondary | ICD-10-CM

## 2014-04-04 DIAGNOSIS — G4700 Insomnia, unspecified: Secondary | ICD-10-CM | POA: Insufficient documentation

## 2014-04-04 DIAGNOSIS — Z0000 Encounter for general adult medical examination without abnormal findings: Secondary | ICD-10-CM

## 2014-04-04 DIAGNOSIS — I10 Essential (primary) hypertension: Secondary | ICD-10-CM

## 2014-04-04 DIAGNOSIS — I495 Sick sinus syndrome: Secondary | ICD-10-CM

## 2014-04-04 DIAGNOSIS — M79672 Pain in left foot: Secondary | ICD-10-CM | POA: Insufficient documentation

## 2014-04-04 DIAGNOSIS — M25562 Pain in left knee: Secondary | ICD-10-CM

## 2014-04-04 DIAGNOSIS — F411 Generalized anxiety disorder: Secondary | ICD-10-CM

## 2014-04-04 LAB — CBC WITH DIFFERENTIAL/PLATELET
BASOS ABS: 0 10*3/uL (ref 0.0–0.1)
Basophils Relative: 0.4 % (ref 0.0–3.0)
EOS ABS: 0.2 10*3/uL (ref 0.0–0.7)
Eosinophils Relative: 2.9 % (ref 0.0–5.0)
HCT: 44.9 % (ref 39.0–52.0)
Hemoglobin: 15.4 g/dL (ref 13.0–17.0)
LYMPHS ABS: 0.9 10*3/uL (ref 0.7–4.0)
LYMPHS PCT: 11.7 % — AB (ref 12.0–46.0)
MCHC: 34.2 g/dL (ref 30.0–36.0)
MCV: 87.3 fl (ref 78.0–100.0)
MONO ABS: 0.5 10*3/uL (ref 0.1–1.0)
Monocytes Relative: 6.3 % (ref 3.0–12.0)
Neutro Abs: 5.9 10*3/uL (ref 1.4–7.7)
Neutrophils Relative %: 78.7 % — ABNORMAL HIGH (ref 43.0–77.0)
PLATELETS: 188 10*3/uL (ref 150.0–400.0)
RBC: 5.15 Mil/uL (ref 4.22–5.81)
RDW: 14.7 % (ref 11.5–15.5)
WBC: 7.4 10*3/uL (ref 4.0–10.5)

## 2014-04-04 LAB — TSH: TSH: 1.2 u[IU]/mL (ref 0.35–4.50)

## 2014-04-04 LAB — BASIC METABOLIC PANEL
BUN: 14 mg/dL (ref 6–23)
CALCIUM: 9.7 mg/dL (ref 8.4–10.5)
CO2: 29 mEq/L (ref 19–32)
Chloride: 107 mEq/L (ref 96–112)
Creatinine, Ser: 1.08 mg/dL (ref 0.40–1.50)
GFR: 74.38 mL/min (ref >60.00)
Glucose, Bld: 123 mg/dL — ABNORMAL HIGH (ref 70–99)
Potassium: 4.4 mEq/L (ref 3.5–5.1)
SODIUM: 142 meq/L (ref 135–145)

## 2014-04-04 LAB — HEPATIC FUNCTION PANEL
ALBUMIN: 4.3 g/dL (ref 3.5–5.2)
ALT: 17 U/L (ref 0–53)
AST: 21 U/L (ref 0–37)
Alkaline Phosphatase: 66 U/L (ref 39–117)
BILIRUBIN DIRECT: 0.2 mg/dL (ref 0.0–0.3)
TOTAL PROTEIN: 7.2 g/dL (ref 6.0–8.3)
Total Bilirubin: 0.7 mg/dL (ref 0.2–1.2)

## 2014-04-04 LAB — URINALYSIS, ROUTINE W REFLEX MICROSCOPIC
Bilirubin Urine: NEGATIVE
Hgb urine dipstick: NEGATIVE
Leukocytes, UA: NEGATIVE
Nitrite: NEGATIVE
PH: 6 (ref 5.0–8.0)
RBC / HPF: NONE SEEN (ref 0–?)
Specific Gravity, Urine: >=1.03 — AB (ref 1.000–1.030)
URINE GLUCOSE: NEGATIVE
Urobilinogen, UA: 0.2 (ref 0.0–1.0)

## 2014-04-04 LAB — LIPID PANEL
Cholesterol: 143 mg/dL (ref 0–200)
HDL: 43.6 mg/dL (ref >39.00)
LDL Cholesterol: 83 mg/dL (ref 0–99)
NONHDL: 99.4
TRIGLYCERIDES: 83 mg/dL (ref 0.0–149.0)
Total CHOL/HDL Ratio: 3
VLDL: 16.6 mg/dL (ref 0.0–40.0)

## 2014-04-04 LAB — PSA: PSA: 0.62 ng/mL (ref 0.10–4.00)

## 2014-04-04 MED ORDER — ENALAPRIL MALEATE 10 MG PO TABS: 10.0000 mg | ORAL_TABLET | Freq: Two times a day (BID) | ORAL | Status: DC

## 2014-04-04 MED ORDER — TEMAZEPAM 30 MG PO CAPS: 30.0000 mg | ORAL_CAPSULE | Freq: Every evening | ORAL | Status: DC | PRN

## 2014-04-04 MED ORDER — BUDESONIDE-FORMOTEROL FUMARATE 160-4.5 MCG/ACT IN AERO: 2.0000 | INHALATION_SPRAY | Freq: Two times a day (BID) | RESPIRATORY_TRACT | Status: DC

## 2014-04-04 MED ORDER — ALPRAZOLAM 0.5 MG PO TABS: 0.5000 mg | ORAL_TABLET | Freq: Two times a day (BID) | ORAL | Status: DC | PRN

## 2014-04-04 NOTE — Progress Notes (Signed)
Pre visit review using our clinic review tool, if applicable. No additional management support is needed unless otherwise documented below in the visit note. 

## 2014-04-04 NOTE — Patient Instructions (Addendum)
You had the tetanus shot today  OK to increase the enalapril to 10 mg twice per day  Please continue all other medications as before, and refills have been done if requested.  Please have the pharmacy call with any other refills you may need.  Please continue your efforts at being more active, low cholesterol diet, and weight control.  You are otherwise up to date with prevention measures today.  Please keep your appointments with your specialists as you may have planned  You will be contacted regarding the referral for: Dr Taylor/cardiology, and Gastroenterology (for bleeding on the [facility]), and Dr Tamala Julian - sport medicine  Please go to the LAB in the Basement (turn left off the elevator) for the tests to be done today  You will be contacted by phone if any changes need to be made immediately.  Otherwise, you will receive a letter about your results with an explanation, but please check with MyChart first.  Please remember to sign up for MyChart if you have not done so, as this will be important to you in the future with finding out test results, communicating by private email, and scheduling acute appointments online when needed.  Please return in 6 months, or sooner if needed

## 2014-04-04 NOTE — Progress Notes (Signed)
Subjective:    Patient ID: Scott [name], male    DOB: 01-04-1956, 59 y.o.   MRN: [id number]  HPI Here for wellness and f/u;  Overall doing ok;  Pt denies CP, worsening SOB, DOE, wheezing, orthopnea, PND, worsening LE edema, palpitations,  syncope, though has had occas lightheaded with walking, and several falls, and occas palps and heart out of rhythm despite the MAZE procedure. He is not sure about cardio f/u, last seen June 2015 per Dr Lovena Le, was advised to f/u at 6 mo (dec 2015).   Denies worsening reflux, abd pain, dysphagia, n/v, bowel change but has had several episodes of small but significant BRBPR without pain on savaysa, last hgb normal oct 2015.  Also has ongoing more severe bilat knee pain with crepitus ,mod to sever, worse to stand and walk, ? Mild recurrent swelling, has not seen ortho.  Makes him walk funny, now with left heel pain as well.  Pt denies neurological change such as new headache, facial or extremity weakness.  Pt denies polydipsia, polyuria, or low sugar symptoms. Pt states overall good compliance with treatment and medications, good tolerability, and has been trying to follow lower cholesterol diet.  Pt denies worsening depressive symptoms, suicidal ideation or panic. No fever, night sweats, wt loss, loss of appetite, or other constitutional symptoms.  Pt states good ability with ADL's, has low fall risk, home safety reviewed and adequate, no other significant changes in hearing or vision, and occasionally active with exercise, mostly walking.  BP at home ave 195-210/102 with HR 102 but he's not sure about the veracity. BP here has been elev some as well BP Readings from Last 3 Encounters:  04/04/14 146/90  12/14/13 167/80  12/13/13 148/90   Past Medical History  Diagnosis Date  . HYPOTHYROIDISM, ACQUIRED NEC 09/28/2006  . HYPERLIPIDEMIA 09/28/2006  . ANXIETY 09/28/2006  . OBSTRUCTIVE SLEEP APNEA 01/06/2008  . HYPERTENSION 09/28/2006  . Atrial fibrillation 09/28/2006    s/p  afib ablation x 2  . Diastolic dysfunction 04/25/9516  . ALLERGIC RHINITIS 01/14/2007  . Long term current use of anticoagulant 04/08/2010  . Right knee DJD 07/09/2010  . Asbestos exposure   . Coronary artery disease 11/24/2011  . S/P CABG x 1 11/26/2011    Right internal mammary artery to right coronary artery  . S/P Maze operation for atrial fibrillation 11/26/2011    complete biatrial lesion set with clipping of LA appendage   Past Surgical History  Procedure Laterality Date  . Hand surgury      left  . Radiofrequency ablation for atrial flutter  2005    CTI  . Radiofrequency ablation for atrial fibrillation  12/30/07 and 06/05/09    afib ablation x 2 by JA  . Maze  11/26/2011    Procedure: MAZE;  Surgeon: Rexene Alberts, MD;  Location: [place];  Service: Open Heart Surgery;  Laterality: N/A;  Cryomaze   . Coronary artery bypass graft  11/26/2011    Procedure: CORONARY ARTERY BYPASS GRAFTING (CABG);  Surgeon: Rexene Alberts, MD;  Location: [place];  Service: Open Heart Surgery;  Laterality: N/A;  coronary artery bypass on pump times one utilizing the right internal mammary artery, transesophageal echocardiogram   . Implantable loop recorder  07/06/12    Linq implanted by Dr Rayann Heman  . Left heart catheterization with coronary angiogram N/A 11/24/2011    Procedure: LEFT HEART CATHETERIZATION WITH CORONARY ANGIOGRAM;  Surgeon: Peter M Martinique, MD;  Location: [facility] CATH LAB;  Service: Cardiovascular;  Laterality: N/A;  . Loop recorder implant N/A 07/06/2012    Procedure: LOOP RECORDER IMPLANT;  Surgeon: Thompson Grayer, MD;  Location: [facility] CATH LAB;  Service: Cardiovascular;  Laterality: N/A;    reports that he quit smoking about 19 years ago. His smoking use included Cigarettes. He has a 39 pack-year smoking history. He does not have any smokeless tobacco history on file. He reports that he does not drink alcohol or use illicit drugs. family history includes Atrial fibrillation in his mother; Dementia in his  father; Hypertension in his father; Stroke in his mother. Allergies  Allergen Reactions  . Zolpidem Tartrate Other (See Comments)    Extremely drowsy the next day   Current Outpatient Prescriptions on File Prior to Visit  Medication Sig Dispense Refill  . acetaminophen (TYLENOL) 500 MG tablet Take 500 mg by mouth every 6 (six) hours as needed for mild pain.     Marland Kitchen ALPRAZolam (XANAX) 0.5 MG tablet Take 1 tablet (0.5 mg total) by mouth 2 (two) times daily as needed. For anxiety 180 tablet 1  . aspirin EC 81 MG EC tablet Take 1 tablet (81 mg total) by mouth daily.    . budesonide-formoterol (SYMBICORT) 160-4.5 MCG/ACT inhaler Inhale 2 puffs into the lungs 2 (two) times daily. 3 Inhaler 3  . Edoxaban Tosylate (SAVAYSA) 60 MG TABS Take 60 mg by mouth daily. 30 tablet 11  . lovastatin (MEVACOR) 40 MG tablet Take 80 mg by mouth every evening.    . metoprolol succinate (TOPROL-XL) 50 MG 24 hr tablet Take 50 mg by mouth daily. Take with or immediately following a meal.    . temazepam (RESTORIL) 30 MG capsule Take 1 capsule (30 mg total) by mouth at bedtime as needed. For sleep 90 capsule 1  . traMADol (ULTRAM) 50 MG tablet Take 1 tablet (50 mg total) by mouth every 8 (eight) hours as needed for moderate pain. 90 tablet 2   No current facility-administered medications on file prior to visit.   Review of Systems Constitutional: Negative for increased diaphoresis, other activity, appetite or other siginficant weight change  HENT: Negative for worsening hearing loss, ear pain, facial swelling, mouth sores and neck stiffness.   Eyes: Negative for other worsening pain, redness or visual disturbance.  Respiratory: Negative for shortness of breath and wheezing.   Cardiovascular: Negative for chest pain and palpitations.  Gastrointestinal: Negative for diarrhea, blood in stool, abdominal distention or other pain Genitourinary: Negative for hematuria, flank pain or change in urine volume.  Musculoskeletal:  Negative for myalgias or other joint complaints.  Skin: Negative for color change and wound.  Neurological: Negative for syncope and numbness. other than noted Hematological: Negative for adenopathy. or other swelling Psychiatric/Behavioral: Negative for hallucinations, self-injury, decreased concentration or other worsening agitation.      Objective:   Physical Exam BP 146/90 mmHg  Pulse 88  Temp(Src) 97.6 F (36.4 C) (Oral)  Ht 5\' 6"  (1.676 m)  Wt 189 lb (85.73 kg)  BMI 30.52 kg/m2 VS noted,  Constitutional: Pt is oriented to person, place, and time. Appears well-developed and well-nourished.  Head: Normocephalic and atraumatic.  Right Ear: External ear normal.  Left Ear: External ear normal.  Nose: Nose normal.  Mouth/Throat: Oropharynx is clear and moist.  Eyes: Conjunctivae and EOM are normal. Pupils are equal, round, and reactive to light.  Neck: Normal range of motion. Neck supple. No JVD present. No tracheal deviation present.  Cardiovascular: Normal rate, regular rhythm, normal heart  sounds and intact distal pulses.   Pulmonary/Chest: Effort normal and breath sounds without rales or wheezing  Abdominal: Soft. Bowel sounds are normal. NT. No HSM  Musculoskeletal: Normal range of motion. Exhibits no edema.  Lymphadenopathy:  Has no cervical adenopathy.  Neurological: Pt is alert and oriented to person, place, and time. Pt has normal reflexes. No cranial nerve deficit. Motor grossly intact Skin: Skin is warm and dry. No rash noted.  Psychiatric:  Has normal mood and affect. Behavior is normal.  Declines DRE today    Assessment & Plan:

## 2014-04-06 ENCOUNTER — Other Ambulatory Visit (INDEPENDENT_AMBULATORY_CARE_PROVIDER_SITE_OTHER): Payer: PPO

## 2014-04-06 ENCOUNTER — Encounter: Payer: Self-pay | Admitting: *Deleted

## 2014-04-06 ENCOUNTER — Other Ambulatory Visit: Payer: Self-pay | Admitting: Internal Medicine

## 2014-04-06 ENCOUNTER — Encounter: Payer: Self-pay | Admitting: Family Medicine

## 2014-04-06 ENCOUNTER — Ambulatory Visit (INDEPENDENT_AMBULATORY_CARE_PROVIDER_SITE_OTHER)
Admission: RE | Admit: 2014-04-06 | Discharge: 2014-04-06 | Disposition: A | Discharge status: Home / Self Care | Payer: PPO | Source: Ambulatory Visit | Attending: Family Medicine | Admitting: Family Medicine

## 2014-04-06 ENCOUNTER — Ambulatory Visit (INDEPENDENT_AMBULATORY_CARE_PROVIDER_SITE_OTHER): Payer: PPO | Admitting: Family Medicine

## 2014-04-06 VITALS — BP 140/84 | HR 91 | Ht 66.0 in | Wt 192.0 lb

## 2014-04-06 DIAGNOSIS — M25561 Pain in right knee: Secondary | ICD-10-CM

## 2014-04-06 DIAGNOSIS — M79672 Pain in left foot: Secondary | ICD-10-CM

## 2014-04-06 DIAGNOSIS — S92009A Unspecified fracture of unspecified calcaneus, initial encounter for closed fracture: Secondary | ICD-10-CM | POA: Insufficient documentation

## 2014-04-06 DIAGNOSIS — S92002A Unspecified fracture of left calcaneus, initial encounter for closed fracture: Secondary | ICD-10-CM

## 2014-04-06 DIAGNOSIS — M23200 Derangement of unspecified lateral meniscus due to old tear or injury, right knee: Secondary | ICD-10-CM

## 2014-04-06 DIAGNOSIS — M25562 Pain in left knee: Secondary | ICD-10-CM

## 2014-04-06 DIAGNOSIS — M23203 Derangement of unspecified medial meniscus due to old tear or injury, right knee: Secondary | ICD-10-CM

## 2014-04-06 DIAGNOSIS — M23209 Derangement of unspecified meniscus due to old tear or injury, unspecified knee: Secondary | ICD-10-CM | POA: Insufficient documentation

## 2014-04-06 DIAGNOSIS — M23206 Derangement of unspecified meniscus due to old tear or injury, right knee: Secondary | ICD-10-CM

## 2014-04-06 NOTE — Progress Notes (Signed)
[name] [address] [place], [place] [address] Phone: [phone] Subjective:    I'm seeing this patient by the request  of:  Scott Cower, MD   CC: left heel pain bilateral knee pain  [id number] Scott [name] [name] is a 59 y.o. male coming in with complaint of left heel pain. Patient states that this pain has been going on for multiple weeks. Patient is an avid walker and has noticed that unfortunately when he walks long distances he has pain on the plantar aspect of the heel. Patient states it is even starting with any type of walking. Patient can also have a dull throbbing sensation. Can be sharp from time to time as well. Patient rates the severity of pain as 6 out of 10. Not stopping him from activities but does not seem to be improving. Patient also complains of bilateral knee pain. Patient states he has had this pain for multiple years. Nothing that is stopping him from any activities. Patient has had multiple steroid injections and he states he's been told by other physicians he would need a knee replacement. Patient has not worn a brace previously. Patient continues to be able to do all daily activities. States that walking long distances can give him significant amount of pain right greater than left. Describes it as dull most of the time but sharp pains with certain movements.    Past medical history, social, surgical and family history all reviewed in electronic medical record.   Review of Systems: No headache, visual changes, nausea, vomiting, diarrhea, constipation, dizziness, abdominal pain, skin rash, fevers, chills, night sweats, weight loss, swollen lymph nodes, body aches, joint swelling, muscle aches, chest pain, shortness of breath, mood changes.   Objective Blood pressure 140/84, pulse 91, height 5\' 6"  (1.676 m), weight 192 lb (87.091 kg), SpO2 97 %.  General: No apparent distress alert and oriented x3 mood and affect normal, dressed  appropriately.  HEENT: Pupils equal, extraocular movements intact  Respiratory: Patient's speak in full sentences and does not appear short of breath  Cardiovascular: No lower extremity edema, non tender, no erythema  Skin: Warm dry intact with no signs of infection or rash on extremities or on axial skeleton.  Abdomen: Soft nontender  Neuro: Cranial nerves II through XII are intact, neurovascularly intact in all extremities with 2+ DTRs and 2+ pulses.  Lymph: No lymphadenopathy of posterior or anterior cervical chain or axillae bilaterally.  Gait normal with good balance and coordination.  MSK:  Non tender with full range of motion and good stability and symmetric strength and tone of shoulders, elbows, wrist, hip, and ankles bilaterally.  Foot exam shows the patient does have a pes cavus foot. Patient is severely tender to palpation mostly over the plantar aspect of the calcaneal region.  Knee:bilateral Normal to inspection with no erythema or effusion or obvious bony abnormalities. Mild medial joint line tenderness ROM full in flexion and extension and lower leg rotation. Ligaments with solid consistent endpoints including ACL, PCL, LCL, MCL. Positive rightMcmurray's, Apley's, and Thessalonian tests.no McMurray's on left Non painful patellar compression. Patellar glide moderatecrepitus. Patellar and quadriceps tendons unremarkable. Hamstring and quadriceps strength is normal.   MSK US performed [id number] This study was ordered, performed, and interpreted by Charlann Boxer D.O.  Knee: All structures visualized. Large degenerative tear with significant displacement of the posterior medial meniscus. Mild to moderate osteophytic changes of the medial joint line Patellar Tendon unremarkable on long and transverse views without  effusion. No abnormality of prepatellar bursa. LCL and MCL unremarkable on long and transverse views. No abnormality of origin of medial or lateral head of the  gastrocnemius.  IMPRESSION:  Severe displacement of a  Degenerative meniscal tear of the posterior medial meniscus with underlying arthritis  Limited muscular skeletal ultrasound of the foot shows the patient does have a calcaneal plantar heel spur fracture with mild displacement.  Procedure note 87681; 15 minutes spent for Therapeutic exercises as stated in above notes.  This included exercises focusing on stretching, strengthening, with significant focus on eccentric aspects.   Proper technique shown and discussed handout in great detail with ATC.  All questions were discussed and answered.     Impression and Recommendations:     This case required medical decision making of moderate complexity.

## 2014-04-06 NOTE — Progress Notes (Signed)
Pre visit review using our clinic review tool, if applicable. No additional management support is needed unless otherwise documented below in the visit note. 

## 2014-04-06 NOTE — Patient Instructions (Signed)
Good to see you. Ice 20 minutes 2 times daily. Usually after activity and before bed. Exercises 3 times a week.  Wear boot for next 3 weeks Try brace on knee.  Take tylenol 325 mg three times a day is the best evidence based medicine we have for arthritis.  Take with tramadol.  Glucosamine sulfate 1500mg  twice a day is a supplement that has been shown to help moderate to severe arthritis. Vitamin D 2000 IU daily Fish oil 2 grams daily.   Tumeric 500mg  twice daily.  Capsaicin topically up to four times a day may also help with pain. Cortisone injections are an option if these interventions do not seem to make a difference or need more relief.  If cortisone injections do not help, there are different types of shots that may help but they take longer to take effect.  We can discuss this at follow up.  It's important that you continue to stay active. Controlling your weight is important.  Consider physical therapy to strengthen muscles around the joint that hurts to take pressure off of the joint itself. Water aerobics and cycling with low resistance are the best two types of exercise for arthritis. Come back and see me in 3 weeks.

## 2014-04-06 NOTE — Assessment & Plan Note (Signed)
Cam walker, icing, avoid direct contact.  Will need repeat U/s in 2-3 weeks to make sure healing Discussed vitamin D.

## 2014-04-06 NOTE — Assessment & Plan Note (Signed)
xrays pending,  Discussed icing,  HEP with athletic trainer today.  Discussed topical NSAIDs Will try medial unloader RTC in 4 weeks.

## 2014-04-10 NOTE — Assessment & Plan Note (Signed)
stable overall by history and exam, recent data reviewed with pt, and pt to continue medical treatment as before,  to f/u any worsening symptoms or concerns. For med refills

## 2014-04-10 NOTE — Assessment & Plan Note (Addendum)
Also for GI referral, may need colonoscopy, currently on savaysa, ideally would need clearance per Dr Lovena Le regarding off savaysa for any procedure

## 2014-04-10 NOTE — Assessment & Plan Note (Signed)

## 2014-04-10 NOTE — Assessment & Plan Note (Signed)
For temazepam refill,  to f/u any worsening symptoms or concerns

## 2014-04-10 NOTE — Assessment & Plan Note (Signed)
For sport med referral, likely djd, possible need steroid shot

## 2014-04-10 NOTE — Assessment & Plan Note (Signed)
For fu with Dr Francis Gaines as he is due

## 2014-04-10 NOTE — Assessment & Plan Note (Signed)
Also sport med referral, pain control, avoid overuse and standing, may be related to how he walks due to knee pain,  to f/u any worsening symptoms or concerns

## 2014-04-10 NOTE — Assessment & Plan Note (Signed)
stable overall by history and exam, recent data reviewed with pt, and pt to continue medical treatment as before,  to f/u any worsening symptoms or concerns,  Lab Results  Component Value Date   HGBA1C 5.6 11/24/2011

## 2014-04-10 NOTE — Assessment & Plan Note (Signed)
stable overall by history and exam, recent data reviewed with pt, and pt to continue medical treatment as before,  to f/u any worsening symptoms or concerns BP Readings from Last 3 Encounters:  04/06/14 140/84  04/04/14 146/90  12/14/13 167/80

## 2014-04-12 ENCOUNTER — Encounter: Payer: Self-pay | Admitting: Internal Medicine

## 2014-04-12 ENCOUNTER — Encounter: Payer: Self-pay | Admitting: Gastroenterology

## 2014-04-17 ENCOUNTER — Ambulatory Visit (INDEPENDENT_AMBULATORY_CARE_PROVIDER_SITE_OTHER): Payer: PPO | Admitting: *Deleted

## 2014-04-17 DIAGNOSIS — I480 Paroxysmal atrial fibrillation: Secondary | ICD-10-CM

## 2014-04-17 LAB — MDC_IDC_ENUM_SESS_TYPE_REMOTE: Date Time Interrogation Session: 20160213050500

## 2014-04-18 ENCOUNTER — Encounter: Payer: Self-pay | Admitting: Internal Medicine

## 2014-04-18 NOTE — Progress Notes (Signed)
Loop recorder 

## 2014-04-27 ENCOUNTER — Encounter: Payer: Self-pay | Admitting: *Deleted

## 2014-04-27 ENCOUNTER — Encounter: Payer: Self-pay | Admitting: Family Medicine

## 2014-04-27 ENCOUNTER — Ambulatory Visit (INDEPENDENT_AMBULATORY_CARE_PROVIDER_SITE_OTHER): Payer: PPO | Admitting: Family Medicine

## 2014-04-27 VITALS — BP 140/90 | HR 80 | Ht 66.0 in | Wt 192.0 lb

## 2014-04-27 DIAGNOSIS — S92002A Unspecified fracture of left calcaneus, initial encounter for closed fracture: Secondary | ICD-10-CM

## 2014-04-27 DIAGNOSIS — M23200 Derangement of unspecified lateral meniscus due to old tear or injury, right knee: Secondary | ICD-10-CM

## 2014-04-27 DIAGNOSIS — M23203 Derangement of unspecified medial meniscus due to old tear or injury, right knee: Secondary | ICD-10-CM

## 2014-04-27 DIAGNOSIS — M23206 Derangement of unspecified meniscus due to old tear or injury, right knee: Secondary | ICD-10-CM

## 2014-04-27 NOTE — Progress Notes (Signed)
  [name] [address] [place], [place] [address] Phone: [phone] Subjective:    I'm seeing this patient by the request  of:  Cathlean Cower, MD   CC: left heel pain bilateral knee pain  [id number] Scott [name] is a 59 y.o. male coming in with complaint of left heel pain. Patient was found to have a fracture of the heel spur. Patient was put in a [employer] and states that he is approximate 60% better. Patient continues to wear the Cam Walker on a regular basis. Patient states his long as he is in the [employer] he seems to be doing relatively well.  Patient was found to have actually a degenerative meniscal tear of the right knee as well. Patient states it is feeling a little bit better. Patient has not been able to do the exercises or the instructions were given been due to him focusing on the heel at this time.    Past medical history, social, surgical and family history all reviewed in electronic medical record.   Review of Systems: No headache, visual changes, nausea, vomiting, diarrhea, constipation, dizziness, abdominal pain, skin rash, fevers, chills, night sweats, weight loss, swollen lymph nodes, body aches, joint swelling, muscle aches, chest pain, shortness of breath, mood changes.   Objective Blood pressure 140/90, pulse 80, height 5\' 6"  (1.676 m), weight 192 lb (87.091 kg), SpO2 98 %.  General: No apparent distress alert and oriented x3 mood and affect normal, dressed appropriately.  HEENT: Pupils equal, extraocular movements intact  Respiratory: Patient's speak in full sentences and does not appear short of breath  Cardiovascular: No lower extremity edema, non tender, no erythema  Skin: Warm dry intact with no signs of infection or rash on extremities or on axial skeleton.  Abdomen: Soft nontender  Neuro: Cranial nerves II through XII are intact, neurovascularly intact in all extremities with 2+ DTRs and 2+ pulses.  Lymph: No  lymphadenopathy of posterior or anterior cervical chain or axillae bilaterally.  Gait normal with good balance and coordination.  MSK:  Non tender with full range of motion and good stability and symmetric strength and tone of shoulders, elbows, wrist, hip, and ankles bilaterally.  Foot exam shows the patient does have a pes cavus foot. Patient is severely tender to palpation mostly over the plantar aspect of the calcaneal region.  Knee:bilateral Normal to inspection with no erythema or effusion or obvious bony abnormalities. Mild medial joint line tenderness ROM full in flexion and extension and lower leg rotation. Ligaments with solid consistent endpoints including ACL, PCL, LCL, MCL. Positive right Mcmurray's, Apley's, and Thessalonian tests.no McMurray's on left Non painful patellar compression. Patellar glide moderatecrepitus. Patellar and quadriceps tendons unremarkable. Hamstring and quadriceps strength is normal.  No change from previous exam  MSK US performed of: Left calcaneal heel This study was ordered, performed, and interpreted by Charlann Boxer D.O.  Limited ultrasound shows the patient still has healing to occur from the calcaneal her spur fracture noted. Patient's has 20% to heal. Mild thickening of the bone. Mild callus formation noted.     Impression and Recommendations:     This case required medical decision making of moderate complexity.

## 2014-04-27 NOTE — Patient Instructions (Signed)
Good to see you Ice bath still would be great Take vitamin D still Continue the boot for 2 weeks then try a shoe See me again 3 weeks.

## 2014-04-27 NOTE — Progress Notes (Signed)
Pre visit review using our clinic review tool, if applicable. No additional management support is needed unless otherwise documented below in the visit note. 

## 2014-04-27 NOTE — Assessment & Plan Note (Signed)
No significant change at this time the patient is not focusing on this problem. Once patient's heel is feeling better we will focus again.

## 2014-04-27 NOTE — Assessment & Plan Note (Signed)
Patient is healing that this can take a significant amount of time. We discussed continuing the calcium supplementation and trying to avoid significant amount impact on this area. Patient does like to walk and states that he is going to continue to walk on a regular basis which may slow healing somewhat. We discussed continuing the icing as well. Patient come back and see me again in 3 weeks. At that time we would need amount of the Cam Walker and have him progress and likely will need custom orthotics.

## 2014-05-16 ENCOUNTER — Encounter: Payer: Self-pay | Admitting: Internal Medicine

## 2014-05-17 ENCOUNTER — Ambulatory Visit (INDEPENDENT_AMBULATORY_CARE_PROVIDER_SITE_OTHER): Payer: PPO | Admitting: *Deleted

## 2014-05-17 DIAGNOSIS — I480 Paroxysmal atrial fibrillation: Secondary | ICD-10-CM | POA: Diagnosis not present

## 2014-05-17 LAB — MDC_IDC_ENUM_SESS_TYPE_REMOTE: Date Time Interrogation Session: 20160304050500

## 2014-05-18 ENCOUNTER — Ambulatory Visit (INDEPENDENT_AMBULATORY_CARE_PROVIDER_SITE_OTHER): Payer: PPO | Admitting: Internal Medicine

## 2014-05-18 ENCOUNTER — Encounter: Payer: Self-pay | Admitting: Family Medicine

## 2014-05-18 ENCOUNTER — Ambulatory Visit (INDEPENDENT_AMBULATORY_CARE_PROVIDER_SITE_OTHER): Payer: PPO | Admitting: Family Medicine

## 2014-05-18 VITALS — BP 132/84 | HR 59 | Ht 66.0 in | Wt 190.0 lb

## 2014-05-18 DIAGNOSIS — S92002A Unspecified fracture of left calcaneus, initial encounter for closed fracture: Secondary | ICD-10-CM | POA: Diagnosis not present

## 2014-05-18 DIAGNOSIS — M1711 Unilateral primary osteoarthritis, right knee: Secondary | ICD-10-CM | POA: Diagnosis not present

## 2014-05-18 NOTE — Assessment & Plan Note (Signed)
Injected today. Discussed home exercises and patient was given a medial unloader brace. Patient will try this and follow-up with me in 4 weeks. Declined formal physical therapy.

## 2014-05-18 NOTE — Assessment & Plan Note (Signed)
Significant improvement. Patient will put in a heel cup. Patient will increase activity as tolerated. Patient will follow-up with me in 4-6 weeks. Patient is pain free at that time patient will be to follow-up as needed.

## 2014-05-18 NOTE — Patient Instructions (Signed)
Good to see you Heel cup at [facility] Wear good shoes.  We will try a brace on the knee  Continue the exercises  See me again in 4-5 weeks.

## 2014-05-18 NOTE — Progress Notes (Signed)
Pre visit review using our clinic review tool, if applicable. No additional management support is needed unless otherwise documented below in the visit note. 

## 2014-05-18 NOTE — Progress Notes (Signed)
  [name] [address] [place], [place] [address] Phone: [phone] Subjective:     CC: left heel pain bilateral knee pain follow up  [id number] Scott [name] is a 59 y.o. male coming in with complaint of left heel pain. Patient was found to have a fracture of the heel spur. Patient was in [facility] for a total of 4 weeks he has transitioned into the shoe for the last week. Patient states that he is doing significantly better. Patient states some mild dull throbbing aching pain at the end of a long walks but otherwise seems to be doing well.  Patient was found to have actually a degenerative meniscal tear of the right knee as well. Patient states that his knee is not getting any better. States that his stopping him from walking.    Past medical history, social, surgical and family history all reviewed in electronic medical record.   Review of Systems: No headache, visual changes, nausea, vomiting, diarrhea, constipation, dizziness, abdominal pain, skin rash, fevers, chills, night sweats, weight loss, swollen lymph nodes, body aches, joint swelling, muscle aches, chest pain, shortness of breath, mood changes.   Objective Blood pressure 132/84, pulse 59, height 5\' 6"  (1.676 m), weight 190 lb (86.183 kg), SpO2 99 %.  General: No apparent distress alert and oriented x3 mood and affect normal, dressed appropriately.  HEENT: Pupils equal, extraocular movements intact  Respiratory: Patient's speak in full sentences and does not appear short of breath  Cardiovascular: No lower extremity edema, non tender, no erythema  Skin: Warm dry intact with no signs of infection or rash on extremities or on axial skeleton.  Abdomen: Soft nontender  Neuro: Cranial nerves II through XII are intact, neurovascularly intact in all extremities with 2+ DTRs and 2+ pulses.  Lymph: No lymphadenopathy of posterior or anterior cervical chain or axillae bilaterally.  Gait normal  with good balance and coordination.  MSK:  Non tender with full range of motion and good stability and symmetric strength and tone of shoulders, elbows, wrist, hip, and ankles bilaterally.  Foot exam shows the patient does have a pes cavus foot. Patient is severely tender to palpation mostly over the plantar aspect of the calcaneal region.  Knee:right Normal to inspection with no erythema or effusion or obvious bony abnormalities. Mild medial joint line tenderness ROM full in flexion and extension and lower leg rotation. Ligaments with solid consistent endpoints including ACL, PCL, LCL, MCL. Positive right Mcmurray's, Apley's, and Thessalonian tests.no McMurray's on left Non painful patellar compression. Patellar glide moderatecrepitus. Patellar and quadriceps tendons unremarkable. Hamstring and quadriceps strength is normal.  No change from previous exam  MSK US performed of: Left calcaneal heel This study was ordered, performed, and interpreted by Charlann Boxer D.O.  Limited ultrasound shows the patient still has healing to occur from the calcaneal her spur fracture noted. Significant healing compared to previous exam. 95% healed.   procedure After informed written and verbal consent, patient was seated on exam table. Right knee was prepped with alcohol swab and utilizing anterolateral approach, patient's right knee space was injected with 4:1  marcaine 0.5%: Kenalog 40mg /dL. Patient tolerated the procedure well without immediate complications.     Impression and Recommendations:     This case required medical decision making of moderate complexity.

## 2014-05-23 NOTE — Progress Notes (Signed)
Loop recorder 

## 2014-05-30 ENCOUNTER — Ambulatory Visit: Payer: PPO | Admitting: Gastroenterology

## 2014-06-08 ENCOUNTER — Ambulatory Visit: Payer: PPO | Admitting: Gastroenterology

## 2014-06-15 ENCOUNTER — Ambulatory Visit (INDEPENDENT_AMBULATORY_CARE_PROVIDER_SITE_OTHER): Payer: PPO | Admitting: Family Medicine

## 2014-06-15 ENCOUNTER — Encounter: Payer: Self-pay | Admitting: Family Medicine

## 2014-06-15 VITALS — BP 130/84 | HR 61 | Ht 66.0 in | Wt 180.0 lb

## 2014-06-15 DIAGNOSIS — M1711 Unilateral primary osteoarthritis, right knee: Secondary | ICD-10-CM | POA: Diagnosis not present

## 2014-06-15 DIAGNOSIS — M722 Plantar fascial fibromatosis: Secondary | ICD-10-CM

## 2014-06-15 DIAGNOSIS — S92002A Unspecified fracture of left calcaneus, initial encounter for closed fracture: Secondary | ICD-10-CM

## 2014-06-15 NOTE — Assessment & Plan Note (Signed)
Patient's plantar heel spur has been healed at this time but is having increasing inflammation in the area and patient does now have signs and symptoms of plantar fasciitis. Patient was given an injection today and tolerated the procedure very well. We discussed icing regimen and home exercises. Patient will continue to wear the heel cups and patient will monitor his walking. We discussed I would be better if he did other activities the patient states that he will continue to walk. Patient and will come back and see me again in 3-4 weeks for this problem.

## 2014-06-15 NOTE — Progress Notes (Signed)
[name] [place], [place] [address] Phone: [phone] Subjective:     CC: left heel pain bilateral knee pain follow up  [id number] Scott [name] [name] is a 58 y.o. male coming in with complaint of left heel pain. Patient was found to have a fracture of the heel spur. Patient states that he continues to have some difficulty with walking. Patient Scott [name] states the pain is not as bad with walking as it was previously but is more painful after sitting a long amount of time. Patient states also for states in the morning seem to be worse. Describes it as a dull throbbing aching sensation. Nothing that stops him from activity.  Patient was found to have actually a degenerative meniscal tear of the right knee as well. Patient has had a steroid injection into the knee and did not notice any significant improvement. Patient did have x-rays of the knee after last exam and was found to have some mild degenerative changes of the knee mostly of the medial compartment. Patient states it is still do some difficulty at all times with a dull throbbing aching sensation.    Past medical history, social, surgical and family history all reviewed in electronic medical record.   Review of Systems: No headache, visual changes, nausea, vomiting, diarrhea, constipation, dizziness, abdominal pain, skin rash, fevers, chills, night sweats, weight loss, swollen lymph nodes, body aches, joint swelling, muscle aches, chest pain, shortness of breath, mood changes.   Objective Blood pressure 130/84, pulse 61, height 5\' 6"  (1.676 m), weight 180 lb (81.647 kg), SpO2 99 %.  General: No apparent distress alert and oriented x3 mood and affect normal, dressed appropriately.  HEENT: Pupils equal, extraocular movements intact  Respiratory: Patient's speak in full sentences and does not appear short of breath  Cardiovascular: No lower extremity edema, non tender, no erythema  Skin: Warm  dry intact with no signs of infection or rash on extremities or on axial skeleton.  Abdomen: Soft nontender  Neuro: Cranial nerves II through XII are intact, neurovascularly intact in all extremities with 2+ DTRs and 2+ pulses.  Lymph: No lymphadenopathy of posterior or anterior cervical chain or axillae bilaterally.  Gait normal with good balance and coordination.  MSK:  Non tender with full range of motion and good stability and symmetric strength and tone of shoulders, elbows, wrist, hip, and ankles bilaterally.  Foot exam shows the patient does have a pes cavus foot. Patient is severely tender to palpation mostly over the plantar aspect of the calcaneal region. This is worse than previous exam.   Knee:right Normal to inspection with no erythema or effusion or obvious bony abnormalities. Mild medial joint line tenderness still present and may be worse ROM full in flexion and extension and lower leg rotation. Ligaments with solid consistent endpoints including ACL, PCL, LCL, MCL. Positive right Mcmurray's, Apley's, and Thessalonian tests.no McMurray's on left Non painful patellar compression. Patellar glide moderatecrepitus. Patellar and quadriceps tendons unremarkable. Hamstring and quadriceps strength is normal.  No change from previous exam  MSK US performed of: Left calcaneal heel This study was ordered, performed, and interpreted by Charlann Boxer D.O.  Limited ultrasound shows the patient still has healing to occur from the calcaneal her spur fracture noted. Patient though does have significant inflammation of the plantar fascia today which was not seen previously. This measures approximately 1.26 cm. Plantar fascitis  Procedure: Real-time Ultrasound Guided Injection of left plantar fascial insertion Device: GE Logiq  E  Ultrasound guided injection is preferred based studies that show increased duration, increased effect, greater accuracy, decreased procedural pain, increased response  rate with ultrasound guided versus blind injection.  Verbal informed consent obtained.  Time-out conducted.  Noted no overlying erythema, induration, or other signs of local infection.  Skin prepped in a sterile fashion.  Local anesthesia: Topical Ethyl chloride.  With sterile technique and under real time ultrasound guidance:  Under ultrasound guidance patient had a 25-gauge 1 inch needle injected from the medial aspect of the calcaneal to the area of insertion of the plantar fascia. Patient did have 0.5 mL of 0.5% Marcaine and 0.5 mL of Kenalog 40 mg/dL injected. Completed without difficulty  Pain immediately resolved suggesting accurate placement of the medication.  Advised to call if fevers/chills, erythema, induration, drainage, or persistent bleeding.  Images permanently stored and available for review in the ultrasound unit.  Impression: Technically successful ultrasound guided injection.    procedure note After informed written and verbal consent, patient was seated on exam table. Right knee was prepped with alcohol swab and utilizing anterolateral approach, patient's right knee space was injected with 15 mg/2.5 mL of Orthovisc (sodium hyaluronate) in a prefilled syringe was injected easily into the knee through a 22-gauge needle. Patient tolerated the procedure well without immediate complications.     Impression and Recommendations:     This case required medical decision making of moderate complexity.

## 2014-06-15 NOTE — Assessment & Plan Note (Signed)
Patient started Orthovisc today. We'll see how patient response. Patient and will come back and see me again in 1 week for second in a series of 4 injections. Patient will continue conservative therapy. We will return patient's brace. Patient will come back in 1 week.

## 2014-06-15 NOTE — Patient Instructions (Signed)
Good to see you Tried injection in the heel today We  Will try the Orthovisc injections in your knee.  Ice in 6 hours See me again next week and we will do another injection.

## 2014-06-15 NOTE — Progress Notes (Signed)
Pre visit review using our clinic review tool, if applicable. No additional management support is needed unless otherwise documented below in the visit note. 

## 2014-06-16 ENCOUNTER — Encounter: Payer: Self-pay | Admitting: Family Medicine

## 2014-06-16 ENCOUNTER — Ambulatory Visit (INDEPENDENT_AMBULATORY_CARE_PROVIDER_SITE_OTHER): Payer: PPO | Admitting: *Deleted

## 2014-06-16 DIAGNOSIS — I480 Paroxysmal atrial fibrillation: Secondary | ICD-10-CM

## 2014-06-20 NOTE — Telephone Encounter (Signed)
encounter closed

## 2014-06-21 NOTE — Progress Notes (Signed)
Loop recorder 

## 2014-06-22 ENCOUNTER — Encounter: Payer: Self-pay | Admitting: Family Medicine

## 2014-06-22 ENCOUNTER — Ambulatory Visit (INDEPENDENT_AMBULATORY_CARE_PROVIDER_SITE_OTHER): Payer: PPO | Admitting: Family Medicine

## 2014-06-22 VITALS — BP 138/86 | HR 80 | Ht 66.0 in | Wt 180.0 lb

## 2014-06-22 DIAGNOSIS — M1711 Unilateral primary osteoarthritis, right knee: Secondary | ICD-10-CM

## 2014-06-22 NOTE — Progress Notes (Signed)
  [name] [place], [place] [address] Phone: [phone] Subjective:     CC: Right knee follow-up  [id number] Scott [name] is a 59 y.o. male coming in with complaint of left heel pain. Patient did have plantar fasciitis and states that the injection did help.  Patient is his right knee pain. Has been found to have osteophytic changes and has failed conservative therapy and has started Orthovisc. Patient is here for second in a series of 4 injections. States that the first injection did not make any significant improvement yet. No new symptoms.    Past medical history, social, surgical and family history all reviewed in electronic medical record.   Review of Systems: No headache, visual changes, nausea, vomiting, diarrhea, constipation, dizziness, abdominal pain, skin rash, fevers, chills, night sweats, weight loss, swollen lymph nodes, body aches, joint swelling, muscle aches, chest pain, shortness of breath, mood changes.   Objective There were no vitals taken for this visit.  General: No apparent distress alert and oriented x3 mood and affect normal, dressed appropriately.  HEENT: Pupils equal, extraocular movements intact  Respiratory: Patient's speak in full sentences and does not appear short of breath  Cardiovascular: No lower extremity edema, non tender, no erythema  Skin: Warm dry intact with no signs of infection or rash on extremities or on axial skeleton.  Abdomen: Soft nontender  Neuro: Cranial nerves II through XII are intact, neurovascularly intact in all extremities with 2+ DTRs and 2+ pulses.  Lymph: No lymphadenopathy of posterior or anterior cervical chain or axillae bilaterally.  Gait normal with good balance and coordination.  MSK:  Non tender with full range of motion and good stability and symmetric strength and tone of shoulders, elbows, wrist, hip, and ankles bilaterally.  Foot exam shows the patient does have a  pes cavus foot. Patient is severely tender to palpation mostly over the plantar aspect of the calcaneal region. This is worse than previous exam.   Knee:right Normal to inspection with no erythema or effusion or obvious bony abnormalities. Mild medial joint line tenderness still present and may be worse ROM full in flexion and extension and lower leg rotation. Ligaments with solid consistent endpoints including ACL, PCL, LCL, MCL. Positive Mcmurray's, Apley's, and Thessalonian tests.no McMurray's on left Non painful patellar compression. Patellar glide moderatecrepitus. Patellar and quadriceps tendons unremarkable. Hamstring and quadriceps strength is normal.  No change from previous exam   procedure note After informed written and verbal consent, patient was seated on exam table. Right knee was prepped with alcohol swab and utilizing anterolateral approach, patient's right knee space was injected with 15 mg/2.5 mL of Orthovisc (sodium hyaluronate) in a prefilled syringe was injected easily into the knee through a 22-gauge needle. Patient tolerated the procedure well without immediate complications.     Impression and Recommendations:     This case required medical decision making of moderate complexity.

## 2014-06-22 NOTE — Assessment & Plan Note (Signed)
Second injection in series of 4 injections of viscous supplementation. She'll continue with conservative therapy and we'll see him in 1 week.

## 2014-06-22 NOTE — Patient Instructions (Signed)
Good to see you Ice is your friend Continue to stay active.  2 down and 2 to go! See you again next week!

## 2014-06-23 ENCOUNTER — Encounter: Payer: Self-pay | Admitting: Internal Medicine

## 2014-06-29 ENCOUNTER — Encounter: Payer: Self-pay | Admitting: Family Medicine

## 2014-06-29 ENCOUNTER — Ambulatory Visit (INDEPENDENT_AMBULATORY_CARE_PROVIDER_SITE_OTHER): Payer: PPO | Admitting: Family Medicine

## 2014-06-29 VITALS — BP 120/82 | HR 60 | Ht 66.0 in | Wt 180.0 lb

## 2014-06-29 DIAGNOSIS — M1711 Unilateral primary osteoarthritis, right knee: Secondary | ICD-10-CM | POA: Diagnosis not present

## 2014-06-29 NOTE — Progress Notes (Signed)
Pre visit review using our clinic review tool, if applicable. No additional management support is needed unless otherwise documented below in the visit note. 

## 2014-06-29 NOTE — Assessment & Plan Note (Signed)
Patient given another injection and third in a series of 4 injections of Orthovisc. Patient is doing a little better. Patient will have the fourth and final injection in 1 week. Patient will continue conservative therapy at this time.

## 2014-06-29 NOTE — Patient Instructions (Signed)
Good to see you Try the new compression in the shoe.  Continue everything else See you next week.

## 2014-06-29 NOTE — Progress Notes (Signed)
  [name] [place], [place] [address] Phone: [phone] Subjective:     CC: Right knee follow-up  [id number] Bernie Ransford [name] is a 59 y.o. male coming in with complaint of left heel pain. Arnett worsening symptoms again. Patient has changed shoes though because it is raining.  Patient is his right knee pain. Has been found to have osteophytic changes and has failed conservative therapy and has started Orthovisc. Patient is here for third injection. Patient states still sore. Patient states that he is not being compliant with the home exercises..    Past medical history, social, surgical and family history all reviewed in electronic medical record.   Review of Systems: No headache, visual changes, nausea, vomiting, diarrhea, constipation, dizziness, abdominal pain, skin rash, fevers, chills, night sweats, weight loss, swollen lymph nodes, body aches, joint swelling, muscle aches, chest pain, shortness of breath, mood changes.   Objective Blood pressure 120/82, pulse 60, height 5\' 6"  (1.676 m), weight 180 lb (81.647 kg), SpO2 98 %.  General: No apparent distress alert and oriented x3 mood and affect normal, dressed appropriately.  HEENT: Pupils equal, extraocular movements intact  Respiratory: Patient's speak in full sentences and does not appear short of breath  Cardiovascular: No lower extremity edema, non tender, no erythema  Skin: Warm dry intact with no signs of infection or rash on extremities or on axial skeleton.  Abdomen: Soft nontender  Neuro: Cranial nerves II through XII are intact, neurovascularly intact in all extremities with 2+ DTRs and 2+ pulses.  Lymph: No lymphadenopathy of posterior or anterior cervical chain or axillae bilaterally.  Gait normal with good balance and coordination.  MSK:  Non tender with full range of motion and good stability and symmetric strength and tone of shoulders, elbows, wrist, hip, and ankles  bilaterally.  Foot exam shows the patient does have a pes cavus foot. Patient is severely tender to palpation mostly over the plantar aspect of the calcaneal region. This is worse than previous exam.   Knee:right Normal to inspection with no erythema or effusion or obvious bony abnormalities. Mild medial joint line tenderness still present and may be worse ROM full in flexion and extension and lower leg rotation. Ligaments with solid consistent endpoints including ACL, PCL, LCL, MCL. Positive Mcmurray's, Apley's, and Thessalonian tests.no McMurray's on left Non painful patellar compression. Patellar glide moderatecrepitus. Patellar and quadriceps tendons unremarkable. Hamstring and quadriceps strength is normal.  No change from previous exam   procedure note After informed written and verbal consent, patient was seated on exam table. Right knee was prepped with alcohol swab and utilizing anterolateral approach, patient's right knee space was injected with 15 mg/2.5 mL of Orthovisc (sodium hyaluronate) in a prefilled syringe was injected easily into the knee through a 22-gauge needle. Patient tolerated the procedure well without immediate complications.     Impression and Recommendations:     This case required medical decision making of moderate complexity.

## 2014-07-06 ENCOUNTER — Encounter: Payer: Self-pay | Admitting: Family Medicine

## 2014-07-06 ENCOUNTER — Ambulatory Visit (INDEPENDENT_AMBULATORY_CARE_PROVIDER_SITE_OTHER): Payer: PPO | Admitting: Family Medicine

## 2014-07-06 VITALS — BP 142/80 | HR 61 | Ht 66.0 in | Wt 180.0 lb

## 2014-07-06 DIAGNOSIS — M1711 Unilateral primary osteoarthritis, right knee: Secondary | ICD-10-CM

## 2014-07-06 NOTE — Progress Notes (Signed)
  [name] [place], [place] [address] Phone: [phone] Subjective:     CC: Right knee follow-up  [id number] Scott [name] [name] is a 59 y.o. male coming in with complaint of   Patient is his right knee pain. Has been found to have osteophytic changes and has failed conservative therapy and has started Orthovisc. Here for 4th and final injection. Patient states mild improvement      Past medical history, social, surgical and family history all reviewed in electronic medical record.   Review of Systems: No headache, visual changes, nausea, vomiting, diarrhea, constipation, dizziness, abdominal pain, skin rash, fevers, chills, night sweats, weight loss, swollen lymph nodes, body aches, joint swelling, muscle aches, chest pain, shortness of breath, mood changes.   Objective Blood pressure 142/80, pulse 61, height 5\' 6"  (1.676 m), weight 180 lb (81.647 kg), SpO2 97 %.  General: No apparent distress alert and oriented x3 mood and affect normal, dressed appropriately.  HEENT: Pupils equal, extraocular movements intact  Respiratory: Patient's speak in full sentences and does not appear short of breath  Cardiovascular: No lower extremity edema, non tender, no erythema  Skin: Warm dry intact with no signs of infection or rash on extremities or on axial skeleton.  Abdomen: Soft nontender  Neuro: Cranial nerves II through XII are intact, neurovascularly intact in all extremities with 2+ DTRs and 2+ pulses.  Lymph: No lymphadenopathy of posterior or anterior cervical chain or axillae bilaterally.  Gait normal with good balance and coordination.  MSK:  Non tender with full range of motion and good stability and symmetric strength and tone of shoulders, elbows, wrist, hip, and ankles bilaterally.  Foot exam shows the patient does have a pes cavus foot. Patient is severely tender to palpation mostly over the plantar aspect of the calcaneal region. This is  worse than previous exam.   Knee:right Normal to inspection with no erythema or effusion or obvious bony abnormalities. Mild medial joint line tenderness still present and may be worse ROM full in flexion and extension and lower leg rotation. Ligaments with solid consistent endpoints including ACL, PCL, LCL, MCL. Positive Mcmurray's, Apley's, and Thessalonian tests.no McMurray's on left Non painful patellar compression. Patellar glide moderatecrepitus. Patellar and quadriceps tendons unremarkable. Hamstring and quadriceps strength is normal.  No change from previous exam   procedure note After informed written and verbal consent, patient was seated on exam table. Right knee was prepped with alcohol swab and utilizing anterolateral approach, patient's right knee space was injected with 15 mg/2.5 mL of Orthovisc (sodium hyaluronate) in a prefilled syringe was injected easily into the knee through a 22-gauge needle. Patient tolerated the procedure well without immediate complications.     Impression and Recommendations:     This case required medical decision making of moderate complexity.

## 2014-07-06 NOTE — Progress Notes (Signed)
Pre visit review using our clinic review tool, if applicable. No additional management support is needed unless otherwise documented below in the visit note. 

## 2014-07-06 NOTE — Patient Instructions (Signed)
Congrats you get a break from me.  Conitnue the exercises when you can.  Ice when you need it especially after activity See me again in 4 weeks.

## 2014-07-06 NOTE — Assessment & Plan Note (Signed)
Patient given fourth injection today. Discussed with patient that over that this will continue to improve. We couldn't get custom brace if necessary. We'll discuss at follow-up in 4 weeks.

## 2014-07-07 ENCOUNTER — Telehealth: Payer: Self-pay | Admitting: Internal Medicine

## 2014-07-07 MED ORDER — TRAMADOL HCL 50 MG PO TABS: 50.0000 mg | ORAL_TABLET | Freq: Three times a day (TID) | ORAL | Status: DC | PRN

## 2014-07-07 NOTE — Telephone Encounter (Signed)
Done hardcopy to steph 

## 2014-07-07 NOTE — Telephone Encounter (Addendum)
rx faxed to pharmacy

## 2014-07-17 ENCOUNTER — Ambulatory Visit (INDEPENDENT_AMBULATORY_CARE_PROVIDER_SITE_OTHER): Payer: PPO | Admitting: *Deleted

## 2014-07-17 DIAGNOSIS — I480 Paroxysmal atrial fibrillation: Secondary | ICD-10-CM | POA: Diagnosis not present

## 2014-07-18 LAB — CUP PACEART REMOTE DEVICE CHECK
Date Time Interrogation Session: 20160426013215
MDC IDC SET ZONE DETECTION INTERVAL: 2000 ms
Zone Setting Detection Interval: 3000 ms
Zone Setting Detection Interval: 400 ms

## 2014-07-18 NOTE — Progress Notes (Signed)
Loop recorder 

## 2014-07-28 LAB — CUP PACEART REMOTE DEVICE CHECK: Date Time Interrogation Session: 20160603123508

## 2014-08-01 ENCOUNTER — Ambulatory Visit (INDEPENDENT_AMBULATORY_CARE_PROVIDER_SITE_OTHER): Payer: PPO | Admitting: Internal Medicine

## 2014-08-01 ENCOUNTER — Encounter: Payer: Self-pay | Admitting: Internal Medicine

## 2014-08-01 VITALS — BP 140/88 | HR 63 | Ht 66.0 in | Wt 180.2 lb

## 2014-08-01 DIAGNOSIS — I5032 Chronic diastolic (congestive) heart failure: Secondary | ICD-10-CM

## 2014-08-01 DIAGNOSIS — I480 Paroxysmal atrial fibrillation: Secondary | ICD-10-CM | POA: Diagnosis not present

## 2014-08-01 DIAGNOSIS — I10 Essential (primary) hypertension: Secondary | ICD-10-CM

## 2014-08-01 LAB — CUP PACEART INCLINIC DEVICE CHECK
Date Time Interrogation Session: 20160607112540
Zone Setting Detection Interval: 2000 ms
Zone Setting Detection Interval: 3000 ms
Zone Setting Detection Interval: 400 ms

## 2014-08-01 MED ORDER — EDOXABAN TOSYLATE 60 MG PO TABS: 60.0000 mg | ORAL_TABLET | Freq: Every day | ORAL | Status: DC

## 2014-08-01 NOTE — Progress Notes (Signed)
HPI [name] returns today for followup. He is a very pleasant middle-age man with a history of atrial fibrillation and flutter, status post multiple ablations, who also has coronary disease, and underwent single-vessel bypass and maze surgery just a year ago. In the interim he has been stable except he does have dyspnea with exertion. No he denies chest pain. No peripheral edema. His weight has not changed. He is trying to lose weight. He walks up to 5 miles a day. Slowly.  Allergies  Allergen Reactions  . Zolpidem Tartrate Other (See Comments)    Extremely drowsy the next day     Current Outpatient Prescriptions  Medication Sig Dispense Refill  . acetaminophen (TYLENOL) 500 MG tablet Take 500 mg by mouth every 6 (six) hours as needed for mild pain.     Marland Kitchen ALPRAZolam (XANAX) 0.5 MG tablet Take 1 tablet (0.5 mg total) by mouth 2 (two) times daily as needed. For anxiety 180 tablet 1  . aspirin EC 81 MG EC tablet Take 1 tablet (81 mg total) by mouth daily.    . budesonide-formoterol (SYMBICORT) 160-4.5 MCG/ACT inhaler Inhale 2 puffs into the lungs 2 (two) times daily. 3 Inhaler 3  . edoxaban (SAVAYSA) 60 MG TABS tablet Take 60 mg by mouth daily. 30 tablet 11  . enalapril (VASOTEC) 10 MG tablet Take 1 tablet (10 mg total) by mouth 2 (two) times daily. 180 tablet 3  . lovastatin (MEVACOR) 40 MG tablet Take 80 mg by mouth every evening.    . metoprolol succinate (TOPROL-XL) 50 MG 24 hr tablet Take 50 mg by mouth daily. Take with or immediately following a meal.    . temazepam (RESTORIL) 30 MG capsule Take 1 capsule (30 mg total) by mouth at bedtime as needed. For sleep 90 capsule 1  . traMADol (ULTRAM) 50 MG tablet Take 1 tablet (50 mg total) by mouth every 8 (eight) hours as needed for moderate pain. 90 tablet 2   No current facility-administered medications for this visit.     Past Medical History  Diagnosis Date  . HYPOTHYROIDISM, ACQUIRED NEC 09/28/2006  . HYPERLIPIDEMIA 09/28/2006    . ANXIETY 09/28/2006  . OBSTRUCTIVE SLEEP APNEA 01/06/2008  . HYPERTENSION 09/28/2006  . Atrial fibrillation 09/28/2006    s/p afib ablation x 2  . Diastolic dysfunction 03/04/1476  . ALLERGIC RHINITIS 01/14/2007  . Long term current use of anticoagulant 04/08/2010  . Right knee DJD 07/09/2010  . Asbestos exposure   . Coronary artery disease 11/24/2011  . S/P CABG x 1 11/26/2011    Right internal mammary artery to right coronary artery  . S/P Maze operation for atrial fibrillation 11/26/2011    complete biatrial lesion set with clipping of LA appendage    ROS:   All systems reviewed and negative except as noted in the HPI.   Past Surgical History  Procedure Laterality Date  . Hand surgury      left  . Radiofrequency ablation for atrial flutter  2005    CTI  . Radiofrequency ablation for atrial fibrillation  12/30/07 and 06/05/09    afib ablation x 2 by JA  . Maze  11/26/2011    Procedure: MAZE;  Surgeon: Rexene Alberts, MD;  Location: [facility];  Service: Open Heart Surgery;  Laterality: N/A;  Cryomaze   . Coronary artery bypass graft  11/26/2011    Procedure: CORONARY ARTERY BYPASS GRAFTING (CABG);  Surgeon: Rexene Alberts, MD;  Location: [facility];  Service: Open Heart Surgery;  Laterality: N/A;  coronary artery bypass on pump times one utilizing the right internal mammary artery, transesophageal echocardiogram   . Implantable loop recorder  07/06/12    Linq implanted by Dr Rayann Heman  . Left heart catheterization with coronary angiogram N/A 11/24/2011    Procedure: LEFT HEART CATHETERIZATION WITH CORONARY ANGIOGRAM;  Surgeon: Peter M Martinique, MD;  Location: [facility] CATH LAB;  Service: Cardiovascular;  Laterality: N/A;  . Loop recorder implant N/A 07/06/2012    Procedure: LOOP RECORDER IMPLANT;  Surgeon: Thompson Grayer, MD;  Location: [facility] CATH LAB;  Service: Cardiovascular;  Laterality: N/A;     Family History  Problem Relation Age of Onset  . Dementia Father   . Hypertension Father   . Atrial  fibrillation Mother   . Stroke Mother      History   Social History  . Marital Status: Single    Spouse Name: N/A  . Number of Children: N/A  . Years of Education: N/A   Occupational History  . worked previously in the [employer] and feels that he was exposed to asbestos.    Social History Main Topics  . Smoking status: Former Smoker -- 1.50 packs/day for 26 years    Types: Cigarettes    Quit date: 04/06/1995  . Smokeless tobacco: Not on file     Comment: not smoked over 10 years  . Alcohol Use: No  . Drug Use: No  . Sexual Activity: Not Currently   Other Topics Concern  . Not on file   Social History Narrative   Pt lives in [place], [place] with his friend.      BP 140/88 mmHg  Pulse 63  Ht 5\' 6"  (1.676 m)  Wt 180 lb 3.2 oz (81.738 kg)  BMI 29.10 kg/m2  SpO2 98%  Physical Exam:  Well appearing 59 yo man, NAD HEENT: Unremarkable Neck:  6 cm JVD, no thyromegally Back:  No CVA tenderness Lungs:  Clear with no wheezes HEART:  Regular rate rhythm, no murmurs, no rubs, no clicks Abd:  soft, positive bowel sounds, no organomegally, no rebound, no guarding Ext:  2 plus pulses, no edema, no cyanosis, no clubbing Skin:  No rashes no nodules Neuro:  CN II through XII intact, motor grossly intact   DEVICE  Normal device function.  ILR. See PaceArt for details. Sinus rhythm with rare atrial tachycardia  Assess/Plan:

## 2014-08-01 NOTE — Assessment & Plan Note (Signed)
He is maintaining NSR. Will follow.  

## 2014-08-01 NOTE — Assessment & Plan Note (Signed)
His blood pressure is minimally elevated. Will follow.

## 2014-08-01 NOTE — Patient Instructions (Addendum)
Medication Instructions:  Your physician recommends that you continue on your current medications as directed. Please refer to the Current Medication list given to you today.   Labwork: None  Testing/Procedures: NONE  Follow-Up: Your physician wants you to follow-up in: [place]. You will receive a reminder letter in the mail two months in advance. If you don't receive a letter, please call our office to schedule the follow-up appointment.   Any Other Special Instructions Will Be Listed Below (If Applicable).

## 2014-08-01 NOTE — Assessment & Plan Note (Signed)
His symptoms are class 2. He will continue his current meds.  

## 2014-08-07 ENCOUNTER — Ambulatory Visit (INDEPENDENT_AMBULATORY_CARE_PROVIDER_SITE_OTHER): Payer: PPO | Admitting: Family Medicine

## 2014-08-07 ENCOUNTER — Encounter: Payer: Self-pay | Admitting: Family Medicine

## 2014-08-07 VITALS — BP 132/86 | HR 52 | Ht 66.0 in | Wt 182.0 lb

## 2014-08-07 DIAGNOSIS — M1711 Unilateral primary osteoarthritis, right knee: Secondary | ICD-10-CM

## 2014-08-07 DIAGNOSIS — S2341XA Sprain of ribs, initial encounter: Secondary | ICD-10-CM | POA: Diagnosis not present

## 2014-08-07 DIAGNOSIS — S29011A Strain of muscle and tendon of front wall of thorax, initial encounter: Secondary | ICD-10-CM | POA: Insufficient documentation

## 2014-08-07 NOTE — Progress Notes (Signed)
[name] [name], [name] [address] Phone: [phone] Subjective:     CC: Right knee follow-up  [id number] Scott [name] is a 59 y.o. male coming in with complaint of right knee follow-up as well as some mild increase left-sided rib pain  Patient is his right knee pain. Has been found to have osteophytic changes and has failed conservative therapy and patient one month ago did finish the viscous supplementation. Patient states that he is feeling a little better but no significant improvement. Still able to do daily activities and is walking a considerable amount. Patient did do a lot of yard work this last weekend. No longer wearing the brace. Not taking anything for pain. No clicking or giving out on him. Pain still on the anterior aspect of the knee mostly seems to be worse with extension.  Patient is also complaining of a new problem. States that there is some left-sided rib pain. Patient was on his roof trying to move a lateral with rotation and felt a pop. Patient had pain with the popping sensation. No discoloration of the skin. No radiation the pain. No difficulty with breathing. Able to still do activity but is somewhat tender in this area he states. No fevers chills or any abnormal weight loss. No rashes noted.      Past medical history, social, surgical and family history all reviewed in electronic medical record.   Review of Systems: No headache, visual changes, nausea, vomiting, diarrhea, constipation, dizziness, abdominal pain, skin rash, fevers, chills, night sweats, weight loss, swollen lymph nodes, body aches, joint swelling, muscle aches, chest pain, shortness of breath, mood changes.   Objective Blood pressure 132/86, pulse 52, height 5\' 6"  (1.676 m), weight 182 lb (82.555 kg), SpO2 99 %.  General: No apparent distress alert and oriented x3 mood and affect normal, dressed appropriately.  HEENT: Pupils equal, extraocular  movements intact  Respiratory: Patient's speak in full sentences and does not appear short of breath  Cardiovascular: No lower extremity edema, non tender, no erythema  Skin: Warm dry intact with no signs of infection or rash on extremities or on axial skeleton.  Abdomen: Soft nontender  Neuro: Cranial nerves II through XII are intact, neurovascularly intact in all extremities with 2+ DTRs and 2+ pulses.  Lymph: No lymphadenopathy of posterior or anterior cervical chain or axillae bilaterally.  Gait normal with good balance and coordination.  MSK:  Non tender with full range of motion and good stability and symmetric strength and tone of shoulders, elbows, wrist, hip, and ankles bilaterally.  Foot exam shows the patient does have a pes cavus foot. Patient is severely tender to palpation mostly over the plantar aspect of the calcaneal region. This is worse than previous exam.   Knee:right Normal to inspection with no erythema or effusion or obvious bony abnormalities. Mild medial joint line tenderness still present and may be worse ROM full in flexion and extension and lower leg rotation. Ligaments with solid consistent endpoints including ACL, PCL, LCL, MCL. Negative Mcmurray's, Apley's, and Thessalonian tests.no McMurray's on left Non painful patellar compression. She no does have pain with extension. Patellar glide moderatecrepitus. Patellar and quadriceps tendons unremarkable. Hamstring and quadriceps strength is normal.  No change from previous exam  Back Exam:  Inspection: Unremarkable  Motion: Flexion 45 deg, Extension 45 deg, Side Bending to 45 deg bilaterally,  Rotation to 45 deg bilaterally  SLR laying: Negative  XSLR laying: Negative  Palpable tenderness:  Tender over the 11th rib on the left side. No crepitus noted. Pain more between the 11th and 12th rib. Mid anterior line FABER: negative. Sensory change: Gross sensation intact to all lumbar and sacral dermatomes.    Reflexes: 2+ at both patellar tendons, 2+ at achilles tendons, Babinski's downgoing.  Strength at foot  Plantar-flexion: 5/5 Dorsi-flexion: 5/5 Eversion: 5/5 Inversion: 5/5  Leg strength  Quad: 5/5 Hamstring: 5/5 Hip flexor: 5/5 Hip abductors: 5/5  Gait unremarkable.     Impression and Recommendations:     This case required medical decision making of moderate complexity.

## 2014-08-07 NOTE — Patient Instructions (Addendum)
Good to see you Ice the side when yo uneed it.  Watch lifting for now Avoid extending knee fully if possible and ice the knee at the end of the day See me when you need me.

## 2014-08-07 NOTE — Assessment & Plan Note (Signed)
Overall improving slowly. We'll continue to monitor. Patient symptoms are more secondary to fat pad irritation at this time. We discussed what activities to potentially avoid. Patient will continue with the conservative therapy and will see me as needed. We discussed the possibility of a dance imaging the patient would not want surgical interventions will continue to try conservative therapy.

## 2014-08-07 NOTE — Assessment & Plan Note (Signed)
Patient does have more of an intercostal muscle strain noted. We discussed continuing his regular regimen but continue to monitor his lifting mechanics. We discussed the possibility of topical anti-inflammatory which patient declined. We also discussed the possibility of a short course of oral anti-inflammatory's but due to patient's blood thinner we would like to avoid this on a regular basis. Patient will do icing and continue to monitor. No organomegaly noted and patient has no systemic findings that further workup is not necessary. Patient will follow-up in 3 weeks if not improved.

## 2014-08-07 NOTE — Progress Notes (Signed)
Pre visit review using our clinic review tool, if applicable. No additional management support is needed unless otherwise documented below in the visit note. 

## 2014-08-14 ENCOUNTER — Telehealth: Payer: Self-pay | Admitting: *Deleted

## 2014-08-14 ENCOUNTER — Encounter: Payer: Self-pay | Admitting: Gastroenterology

## 2014-08-14 ENCOUNTER — Encounter: Payer: Self-pay | Admitting: Internal Medicine

## 2014-08-14 ENCOUNTER — Ambulatory Visit (INDEPENDENT_AMBULATORY_CARE_PROVIDER_SITE_OTHER): Payer: PPO | Admitting: Gastroenterology

## 2014-08-14 VITALS — BP 144/80 | HR 60 | Ht 65.5 in | Wt 180.5 lb

## 2014-08-14 DIAGNOSIS — I4892 Unspecified atrial flutter: Secondary | ICD-10-CM

## 2014-08-14 DIAGNOSIS — K625 Hemorrhage of anus and rectum: Secondary | ICD-10-CM

## 2014-08-14 MED ORDER — NA SULFATE-K SULFATE-MG SULF 17.5-3.13-1.6 GM/177ML PO SOLN: 1.0000 | Freq: Once | ORAL | Status: DC

## 2014-08-14 NOTE — Telephone Encounter (Signed)
  08/14/2014   RE: Thaer Miyoshi [name] DOB: [birth year] MRN: [id number]   Dear  Salome Spotted   We have scheduled the above patient for an endoscopic procedure. Our records show that he is on anticoagulation therapy.   Please advise as to how long the patient may come off his therapy of Savaysa prior to the procedure, which is scheduled for  10/19/2014.  Please fax back/ or route the completed form to [place] at [phone].   Sincerely,    Genella Mech

## 2014-08-14 NOTE — Progress Notes (Signed)
_                                                                                                                History of Present Illness:  [name] is a 59 year old white male referred at the request of Dr. [name] for evaluation of rectal bleeding.  He has a history of A. fib for which he is on Savaysa.  On several occasions he's had bright red blood per rectum consisting of bright red blood in the absence of a bowel movement doesn't bleeding down his leg and blood mixed with stools and on the toilet water.  He denies rectal or abdominal pain.  There has been no change in bowel habits.  He's never had a colonoscopy   Past Medical History  Diagnosis Date  . HYPOTHYROIDISM, ACQUIRED NEC 09/28/2006    pt denies and doesn't know  . HYPERLIPIDEMIA 09/28/2006  . ANXIETY 09/28/2006  . OBSTRUCTIVE SLEEP APNEA 01/06/2008  . HYPERTENSION 09/28/2006  . Atrial fibrillation 09/28/2006    s/p afib ablation x 2  . Diastolic dysfunction 08/29/1023  . ALLERGIC RHINITIS 01/14/2007  . Long term current use of anticoagulant 04/08/2010  . Right knee DJD 07/09/2010  . Asbestos exposure   . Coronary artery disease 11/24/2011  . S/P CABG x 1 11/26/2011    Right internal mammary artery to right coronary artery  . S/P Maze operation for atrial fibrillation 11/26/2011    complete biatrial lesion set with clipping of LA appendage  . CHF (congestive heart failure)   . COPD (chronic obstructive pulmonary disease)   . Gallstones    Past Surgical History  Procedure Laterality Date  . Hand surgury Left   . Radiofrequency ablation for atrial flutter  2005    CTI  . Radiofrequency ablation for atrial fibrillation  12/30/07 and 06/05/09    afib ablation x 2 by JA  . Maze  11/26/2011    Procedure: MAZE;  Surgeon: Rexene Alberts, MD;  Location: [facility];  Service: Open Heart Surgery;  Laterality: N/A;  Cryomaze   . Coronary artery bypass graft  11/26/2011    Procedure: CORONARY ARTERY BYPASS GRAFTING (CABG);   Surgeon: Rexene Alberts, MD;  Location: [facility];  Service: Open Heart Surgery;  Laterality: N/A;  coronary artery bypass on pump times one utilizing the right internal mammary artery, transesophageal echocardiogram   . Left heart catheterization with coronary angiogram N/A 11/24/2011    Procedure: LEFT HEART CATHETERIZATION WITH CORONARY ANGIOGRAM;  Surgeon: Peter M Martinique, MD;  Location: [facility] CATH LAB;  Service: Cardiovascular;  Laterality: N/A;  . Loop recorder implant N/A 07/06/2012    Procedure: LOOP RECORDER IMPLANT;  Surgeon: Thompson Grayer, MD;  Location: [facility] CATH LAB;  Service: Cardiovascular;  Laterality: N/A;   family history includes Atrial fibrillation in his mother; Cancer in his paternal grandmother; Dementia in his father and paternal grandfather; Hypertension in his father; Stroke in his mother. Current Outpatient Prescriptions  Medication Sig Dispense Refill  . acetaminophen (TYLENOL)  500 MG tablet Take 500 mg by mouth every 6 (six) hours as needed for mild pain.     Marland Kitchen ALPRAZolam (XANAX) 0.5 MG tablet Take 1 tablet (0.5 mg total) by mouth 2 (two) times daily as needed. For anxiety 180 tablet 1  . aspirin EC 81 MG EC tablet Take 1 tablet (81 mg total) by mouth daily.    . budesonide-formoterol (SYMBICORT) 160-4.5 MCG/ACT inhaler Inhale 2 puffs into the lungs 2 (two) times daily. 3 Inhaler 3  . edoxaban (SAVAYSA) 60 MG TABS tablet Take 60 mg by mouth daily. 30 tablet 11  . enalapril (VASOTEC) 10 MG tablet Take 1 tablet (10 mg total) by mouth 2 (two) times daily. 180 tablet 3  . lovastatin (MEVACOR) 40 MG tablet Take 80 mg by mouth every evening.    . metoprolol succinate (TOPROL-XL) 50 MG 24 hr tablet Take 50 mg by mouth daily. Take with or immediately following a meal.    . temazepam (RESTORIL) 30 MG capsule Take 1 capsule (30 mg total) by mouth at bedtime as needed. For sleep 90 capsule 1  . traMADol (ULTRAM) 50 MG tablet Take 1 tablet (50 mg total) by mouth every 8 (eight) hours as needed  for moderate pain. 90 tablet 2   No current facility-administered medications for this visit.   Allergies as of 08/14/2014 - Review Complete 08/14/2014  Allergen Reaction Noted  . Ambien [zolpidem tartrate]  08/14/2014  . Zolpidem tartrate Other (See Comments) 09/28/2006    reports that he quit smoking about 19 years ago. His smoking use included Cigarettes. He has a 39 pack-year smoking history. He has never used smokeless tobacco. He reports that he does not drink alcohol or use illicit drugs.   Review of Systems: Pertinent positive and negative review of systems were noted in the above HPI section. All other review of systems were otherwise negative.  Vital signs were reviewed in today's medical record Physical Exam: General: Well developed , well nourished, no acute distress Skin: anicteric Head: Normocephalic and atraumatic Eyes:  sclerae anicteric, EOMI Ears: Normal auditory acuity Mouth: No deformity or lesions Neck: Supple, no masses or thyromegaly Lymph Nodes: no lymphadenopathy Lungs: Clear throughout to auscultation Heart: Regular rate and rhythm; no murmurs, rubs or bruits Gastroinestinal: Soft, non tender and non distended. No masses, hepatosplenomegaly or hernias noted. Normal Bowel sounds Rectal: Grade 3 hemorrhoids are present Musculoskeletal: Symmetrical with no gross deformities  Skin: No lesions on visible extremities Pulses:  Normal pulses noted Extremities: No clubbing, cyanosis, edema or deformities noted Neurological: Alert oriented x 4, grossly nonfocal Cervical Nodes:  No significant cervical adenopathy Inguinal Nodes: No significant inguinal adenopathy Psychological:  Alert and cooperative. Normal mood and affect  See Assessment and Plan under Problem List

## 2014-08-14 NOTE — Assessment & Plan Note (Signed)
Rectal bleeding very likely is hemorrhoidal.  Leading is made worse by his anticoagulation therapy.  Patient has never had a colonoscopy.  Recommendations #1 colonoscopy.  I'll check with his cardiologist whether Savaya can be held #2 to consider band ligation of internal hemorrhoids  The risk of holding anticoagulation therapy or antiplatelet medications was discussed including the increased risk for thromboembolic disease that may include DVT, pulmonary emboli and stroke. The patient understands this risk and is willing to proceed with temporally holding the medication provided that this is approved by her PCP or cardiologist.  CC Dr. [name]

## 2014-08-14 NOTE — Patient Instructions (Addendum)
You have been scheduled for a colonoscopy. Please follow written instructions given to you at your visit today.  Please pick up your prep supplies at the pharmacy within the next 1-3 days. If you use inhalers (even only as needed), please bring them with you on the day of your procedure. Your physician has requested that you go to www.startemmi.com and enter the access code given to you at your visit today. This web site gives a general overview about your procedure. However, you should still follow specific instructions given to you by our office regarding your preparation for the procedure.   You will be contacted about holding your blood thinner You will need to follow up with your PCP concerning your blood pressure

## 2014-08-15 ENCOUNTER — Ambulatory Visit (INDEPENDENT_AMBULATORY_CARE_PROVIDER_SITE_OTHER): Payer: PPO | Admitting: *Deleted

## 2014-08-15 DIAGNOSIS — I480 Paroxysmal atrial fibrillation: Secondary | ICD-10-CM | POA: Diagnosis not present

## 2014-08-16 NOTE — Progress Notes (Signed)
Loop recorder 

## 2014-08-21 NOTE — Telephone Encounter (Signed)
Pt has a CHADS score of 2.  Okay to hold Savasya x 48 hours prior to procedure.

## 2014-08-21 NOTE — Telephone Encounter (Signed)
Patient aware of instructions to hold

## 2014-08-22 LAB — CUP PACEART REMOTE DEVICE CHECK: Date Time Interrogation Session: 20160628103214

## 2014-08-31 ENCOUNTER — Encounter: Payer: Self-pay | Admitting: Cardiology

## 2014-09-01 ENCOUNTER — Telehealth: Payer: Self-pay

## 2014-09-01 NOTE — Telephone Encounter (Signed)
Pt is requesting refill of Valsartan 320 mg to [facility]. Medication has been discontinued per pt's medication history. Should pt's medication be continued and refilled?

## 2014-09-01 NOTE — Telephone Encounter (Signed)
Pt should not be taking this, since he is already on 10 mg vasotec, thanks

## 2014-09-04 NOTE — Telephone Encounter (Signed)
Pt advised.

## 2014-09-05 ENCOUNTER — Telehealth: Payer: Self-pay | Admitting: Internal Medicine

## 2014-09-05 NOTE — Telephone Encounter (Signed)
Instructed pt that he received letter b/c we have not received any information from his home monitor since 08-15-14 and all he needs to do is send a manual transmission w/ home monitor. Pt verbalized understanding.

## 2014-09-05 NOTE — Telephone Encounter (Signed)
New message     Pt calling in regards to letter stating he needs to have device checked.  Pt was seen last on 08-01-2014, pt unsure if he needs to be seen again.   Please call to advise.

## 2014-09-06 ENCOUNTER — Encounter: Payer: Self-pay | Admitting: Internal Medicine

## 2014-09-14 ENCOUNTER — Ambulatory Visit (INDEPENDENT_AMBULATORY_CARE_PROVIDER_SITE_OTHER): Payer: PPO

## 2014-09-14 DIAGNOSIS — I480 Paroxysmal atrial fibrillation: Secondary | ICD-10-CM | POA: Diagnosis not present

## 2014-09-27 NOTE — Progress Notes (Signed)
Loop recorder 

## 2014-10-06 LAB — CUP PACEART REMOTE DEVICE CHECK: Date Time Interrogation Session: 20160812155648

## 2014-10-13 ENCOUNTER — Ambulatory Visit (INDEPENDENT_AMBULATORY_CARE_PROVIDER_SITE_OTHER): Payer: PPO | Admitting: *Deleted

## 2014-10-13 DIAGNOSIS — I480 Paroxysmal atrial fibrillation: Secondary | ICD-10-CM | POA: Diagnosis not present

## 2014-10-18 NOTE — Progress Notes (Signed)
Loop recorder 

## 2014-10-19 ENCOUNTER — Encounter: Payer: PPO | Admitting: Gastroenterology

## 2014-10-25 ENCOUNTER — Encounter: Payer: Self-pay | Admitting: Internal Medicine

## 2014-11-02 ENCOUNTER — Other Ambulatory Visit: Payer: Self-pay

## 2014-11-02 MED ORDER — TEMAZEPAM 30 MG PO CAPS: 30.0000 mg | ORAL_CAPSULE | Freq: Every evening | ORAL | Status: DC | PRN

## 2014-11-02 MED ORDER — BUDESONIDE-FORMOTEROL FUMARATE 160-4.5 MCG/ACT IN AERO: 2.0000 | INHALATION_SPRAY | Freq: Two times a day (BID) | RESPIRATORY_TRACT | Status: DC

## 2014-11-02 MED ORDER — METOPROLOL SUCCINATE ER 50 MG PO TB24: 50.0000 mg | ORAL_TABLET | Freq: Every day | ORAL | Status: DC

## 2014-11-02 NOTE — Telephone Encounter (Signed)
.  one Done hardcopy to Dahlia  

## 2014-11-03 NOTE — Telephone Encounter (Signed)
Rx faxed to pharmacy  

## 2014-11-07 ENCOUNTER — Ambulatory Visit (INDEPENDENT_AMBULATORY_CARE_PROVIDER_SITE_OTHER): Payer: PPO | Admitting: Internal Medicine

## 2014-11-07 ENCOUNTER — Other Ambulatory Visit (INDEPENDENT_AMBULATORY_CARE_PROVIDER_SITE_OTHER): Payer: PPO

## 2014-11-07 ENCOUNTER — Encounter: Payer: Self-pay | Admitting: Internal Medicine

## 2014-11-07 VITALS — BP 126/80 | HR 52 | Temp 97.7°F | Ht 66.0 in | Wt 182.0 lb

## 2014-11-07 DIAGNOSIS — M25562 Pain in left knee: Secondary | ICD-10-CM

## 2014-11-07 DIAGNOSIS — M25561 Pain in right knee: Secondary | ICD-10-CM

## 2014-11-07 DIAGNOSIS — R739 Hyperglycemia, unspecified: Secondary | ICD-10-CM

## 2014-11-07 DIAGNOSIS — E785 Hyperlipidemia, unspecified: Secondary | ICD-10-CM

## 2014-11-07 DIAGNOSIS — I10 Essential (primary) hypertension: Secondary | ICD-10-CM | POA: Diagnosis not present

## 2014-11-07 LAB — BASIC METABOLIC PANEL
BUN: 12 mg/dL (ref 6–23)
CALCIUM: 9 mg/dL (ref 8.4–10.5)
CHLORIDE: 108 meq/L (ref 96–112)
CO2: 29 meq/L (ref 19–32)
CREATININE: 0.93 mg/dL (ref 0.40–1.50)
GFR: 88.21 mL/min (ref >60.00)
GLUCOSE: 94 mg/dL (ref 70–99)
Potassium: 4.5 mEq/L (ref 3.5–5.1)
Sodium: 143 mEq/L (ref 135–145)

## 2014-11-07 LAB — HEPATIC FUNCTION PANEL
ALBUMIN: 4.1 g/dL (ref 3.5–5.2)
ALK PHOS: 51 U/L (ref 39–117)
ALT: 14 U/L (ref 0–53)
AST: 19 U/L (ref 0–37)
BILIRUBIN DIRECT: 0.2 mg/dL (ref 0.0–0.3)
TOTAL PROTEIN: 6.8 g/dL (ref 6.0–8.3)
Total Bilirubin: 0.7 mg/dL (ref 0.2–1.2)

## 2014-11-07 LAB — LIPID PANEL
CHOLESTEROL: 131 mg/dL (ref 0–200)
HDL: 48 mg/dL (ref >39.00)
LDL Cholesterol: 74 mg/dL (ref 0–99)
NONHDL: 82.92
Total CHOL/HDL Ratio: 3
Triglycerides: 43 mg/dL (ref 0.0–149.0)
VLDL: 8.6 mg/dL (ref 0.0–40.0)

## 2014-11-07 LAB — HEMOGLOBIN A1C: HEMOGLOBIN A1C: 5.1 % (ref 4.6–6.5)

## 2014-11-07 MED ORDER — ALPRAZOLAM 0.5 MG PO TABS: 0.5000 mg | ORAL_TABLET | Freq: Two times a day (BID) | ORAL | Status: DC | PRN

## 2014-11-07 MED ORDER — TRAMADOL HCL ER 300 MG PO TB24: 300.0000 mg | ORAL_TABLET | Freq: Every day | ORAL | Status: DC

## 2014-11-07 NOTE — Patient Instructions (Signed)
Please take all new medication as prescribed - the tramadol ER  OK to stop the tramadol 50 mg  Please continue all other medications as before, and refills have been done if requested - the alprazolam  Please have the pharmacy call with any other refills you may need.  Please continue your efforts at being more active, low cholesterol diet, and weight control.  Please keep your appointments with your specialists as you may have planned  Please go to the LAB in the Basement (turn left off the elevator) for the tests to be done today  You will be contacted by phone if any changes need to be made immediately.  Otherwise, you will receive a letter about your results with an explanation, but please check with MyChart first.  Please remember to sign up for MyChart if you have not done so, as this will be important to you in the future with finding out test results, communicating by private email, and scheduling acute appointments online when needed.  Please return in 6 months, or sooner if needed

## 2014-11-07 NOTE — Assessment & Plan Note (Signed)
stable overall by history and exam, recent data reviewed with pt, and pt to continue medical treatment as before,  to f/u any worsening symptoms or concerns Lab Results  Component Value Date   LDLCALC 83 04/04/2014

## 2014-11-07 NOTE — Progress Notes (Signed)
Pre visit review using our clinic review tool, if applicable. No additional management support is needed unless otherwise documented below in the visit note. 

## 2014-11-07 NOTE — Progress Notes (Signed)
Subjective:    Patient ID: Scott [name], male    DOB: Jul 12, 1955, 59 y.o.   MRN: [id number]  HPI  Here to f/u; overall doing ok,  Pt denies chest pain, increasing sob or doe, wheezing, orthopnea, PND, increased LE swelling, palpitations, dizziness or syncope.  Pt denies new neurological symptoms such as new headache, or facial or extremity weakness or numbness.  Pt denies polydipsia, polyuria, or low sugar episode.   Pt denies new neurological symptoms such as new headache, or facial or extremity weakness or numbness.   Pt states overall good compliance with meds, mostly trying to follow appropriate diet, with wt overall stable,  but little exercise however. C/o chronic pain to bilat knees with recent injections not much help.  Walks 5 miles per day to keep wt down  Denies worsening depressive symptoms, suicidal ideation, or panic; has ongoing anxiety, needs refill Past Medical History  Diagnosis Date  . HYPOTHYROIDISM, ACQUIRED NEC 09/28/2006    pt denies and doesn't know  . HYPERLIPIDEMIA 09/28/2006  . ANXIETY 09/28/2006  . OBSTRUCTIVE SLEEP APNEA 01/06/2008  . HYPERTENSION 09/28/2006  . Atrial fibrillation 09/28/2006    s/p afib ablation x 2  . Diastolic dysfunction 07/26/6071  . ALLERGIC RHINITIS 01/14/2007  . Long term current use of anticoagulant 04/08/2010  . Right knee DJD 07/09/2010  . Asbestos exposure   . Coronary artery disease 11/24/2011  . S/P CABG x 1 11/26/2011    Right internal mammary artery to right coronary artery  . S/P Maze operation for atrial fibrillation 11/26/2011    complete biatrial lesion set with clipping of LA appendage  . CHF (congestive heart failure)   . COPD (chronic obstructive pulmonary disease)   . Gallstones    Past Surgical History  Procedure Laterality Date  . Hand surgury Left   . Radiofrequency ablation for atrial flutter  2005    CTI  . Radiofrequency ablation for atrial fibrillation  12/30/07 and 06/05/09    afib ablation x 2 by JA  . Maze  11/26/2011     Procedure: MAZE;  Surgeon: Rexene Alberts, MD;  Location: [place];  Service: Open Heart Surgery;  Laterality: N/A;  Cryomaze   . Coronary artery bypass graft  11/26/2011    Procedure: CORONARY ARTERY BYPASS GRAFTING (CABG);  Surgeon: Rexene Alberts, MD;  Location: [place];  Service: Open Heart Surgery;  Laterality: N/A;  coronary artery bypass on pump times one utilizing the right internal mammary artery, transesophageal echocardiogram   . Left heart catheterization with coronary angiogram N/A 11/24/2011    Procedure: LEFT HEART CATHETERIZATION WITH CORONARY ANGIOGRAM;  Surgeon: Peter M Martinique, MD;  Location: [facility] CATH LAB;  Service: Cardiovascular;  Laterality: N/A;  . Loop recorder implant N/A 07/06/2012    Procedure: LOOP RECORDER IMPLANT;  Surgeon: Thompson Grayer, MD;  Location: [facility] CATH LAB;  Service: Cardiovascular;  Laterality: N/A;    reports that he quit smoking about 19 years ago. His smoking use included Cigarettes. He has a 39 pack-year smoking history. He has never used smokeless tobacco. He reports that he does not drink alcohol or use illicit drugs. family history includes Atrial fibrillation in his mother; Cancer in his paternal grandmother; Dementia in his father and paternal grandfather; Hypertension in his father; Stroke in his mother. Allergies  Allergen Reactions  . Ambien [Zolpidem Tartrate]   . Zolpidem Tartrate Other (See Comments)    Extremely drowsy the next day   Current Outpatient Prescriptions on File  Prior to Visit  Medication Sig Dispense Refill  . acetaminophen (TYLENOL) 500 MG tablet Take 500 mg by mouth every 6 (six) hours as needed for mild pain.     Marland Kitchen ALPRAZolam (XANAX) 0.5 MG tablet Take 1 tablet (0.5 mg total) by mouth 2 (two) times daily as needed. For anxiety 180 tablet 1  . aspirin EC 81 MG EC tablet Take 1 tablet (81 mg total) by mouth daily.    . budesonide-formoterol (SYMBICORT) 160-4.5 MCG/ACT inhaler Inhale 2 puffs into the lungs 2 (two) times daily. 3  Inhaler 1  . edoxaban (SAVAYSA) 60 MG TABS tablet Take 60 mg by mouth daily. 30 tablet 11  . enalapril (VASOTEC) 10 MG tablet Take 1 tablet (10 mg total) by mouth 2 (two) times daily. 180 tablet 3  . lovastatin (MEVACOR) 40 MG tablet Take 80 mg by mouth every evening.    . metoprolol succinate (TOPROL-XL) 50 MG 24 hr tablet Take 1 tablet (50 mg total) by mouth daily. Take with or immediately following a meal. 90 tablet 1  . temazepam (RESTORIL) 30 MG capsule Take 1 capsule (30 mg total) by mouth at bedtime as needed. For sleep 90 capsule 1  . Na Sulfate-K Sulfate-Mg Sulf (SUPREP BOWEL PREP) SOLN Take 1 kit by mouth once. (Patient not taking: Reported on 11/07/2014) 1 Bottle 0   No current facility-administered medications on file prior to visit.   Review of Systems  Constitutional: Negative for unusual diaphoresis or night sweats HENT: Negative for ringing in ear or discharge Eyes: Negative for double vision or worsening visual disturbance.  Respiratory: Negative for choking and stridor.   Gastrointestinal: Negative for vomiting or other signifcant bowel change Genitourinary: Negative for hematuria or change in urine volume.  Musculoskeletal: Negative for other MSK pain or swelling Skin: Negative for color change and worsening wound.  Neurological: Negative for tremors and numbness other than noted  Psychiatric/Behavioral: Negative for decreased concentration or agitation other than above       Objective:   Physical Exam BP 126/80 mmHg  Pulse 52  Temp(Src) 97.7 F (36.5 C) (Oral)  Ht 5' 6"  (1.676 m)  Wt 182 lb (82.555 kg)  BMI 29.39 kg/m2  SpO2 99% VS noted,  Constitutional: Pt appears in no significant distress HENT: Head: NCAT.  Right Ear: External ear normal.  Left Ear: External ear normal.  Eyes: . Pupils are equal, round, and reactive to light. Conjunctivae and EOM are normal Neck: Normal range of motion. Neck supple.  Cardiovascular: Normal rate and regular rhythm.     Pulmonary/Chest: Effort normal and breath sounds without rales or wheezing.  Abd:  Soft, NT, ND, + BS Neurological: Pt is alert. Not confused , motor grossly intact Skin: Skin is warm. No rash, no LE edema Psychiatric: Pt behavior is normal. No agitation. mild nervous.  .   Assessment & Plan:

## 2014-11-07 NOTE — Assessment & Plan Note (Signed)
Consider ortho referral, for now to try tramadol ER 300 qd prn

## 2014-11-07 NOTE — Assessment & Plan Note (Signed)
stable overall by history and exam, recent data reviewed with pt, and pt to continue medical treatment as before,  to f/u any worsening symptoms or concerns Lab Results  Component Value Date   HGBA1C 5.6 11/24/2011

## 2014-11-07 NOTE — Assessment & Plan Note (Signed)
stable overall by history and exam, recent data reviewed with pt, and pt to continue medical treatment as before,  to f/u any worsening symptoms or concerns BP Readings from Last 3 Encounters:  11/07/14 126/80  08/14/14 144/80  08/07/14 132/86

## 2014-11-11 LAB — CUP PACEART REMOTE DEVICE CHECK: MDC IDC SESS DTM: 20160917170935

## 2014-11-11 NOTE — Progress Notes (Signed)
Carelink summary report received. Battery status OK. Normal device function. No new symptom episodes, tachy episodes, brady, or pause episodes. No new AF episodes, +ASA 81mg . Monthly summary reports and ROV with GT in 07/2015.

## 2014-11-13 ENCOUNTER — Ambulatory Visit (INDEPENDENT_AMBULATORY_CARE_PROVIDER_SITE_OTHER): Payer: PPO | Admitting: *Deleted

## 2014-11-13 DIAGNOSIS — I480 Paroxysmal atrial fibrillation: Secondary | ICD-10-CM | POA: Diagnosis not present

## 2014-11-15 NOTE — Progress Notes (Signed)
Loop recorder 

## 2014-11-18 LAB — CUP PACEART REMOTE DEVICE CHECK: Date Time Interrogation Session: 20160919223633

## 2014-11-18 NOTE — Progress Notes (Signed)
Carelink summary report received. Battery status OK. Normal device function. No new symptom episodes, tachy episodes, brady, or pause episodes. No new AF episodes. Monthly summary reports and ROV with GT in 07/2015.

## 2014-11-29 ENCOUNTER — Encounter: Payer: Self-pay | Admitting: Internal Medicine

## 2014-11-30 ENCOUNTER — Encounter: Payer: Self-pay | Admitting: Internal Medicine

## 2014-12-13 ENCOUNTER — Ambulatory Visit (INDEPENDENT_AMBULATORY_CARE_PROVIDER_SITE_OTHER): Payer: PPO | Admitting: *Deleted

## 2014-12-13 DIAGNOSIS — I480 Paroxysmal atrial fibrillation: Secondary | ICD-10-CM | POA: Diagnosis not present

## 2014-12-14 NOTE — Progress Notes (Signed)
LOOP RECORDER  

## 2014-12-20 ENCOUNTER — Other Ambulatory Visit: Payer: Self-pay

## 2014-12-20 MED ORDER — LOVASTATIN 40 MG PO TABS: 80.0000 mg | ORAL_TABLET | Freq: Every evening | ORAL | Status: DC

## 2015-01-02 LAB — CUP PACEART REMOTE DEVICE CHECK: Date Time Interrogation Session: 20161019223848

## 2015-01-02 NOTE — Progress Notes (Signed)
Carelink summary report received. Battery status OK. Normal device function. No new symptom episodes, tachy episodes, brady, or pause episodes. No new AF episodes, +Savaysa. Monthly summary reports and ROV with GT in 07/2015.

## 2015-01-05 ENCOUNTER — Other Ambulatory Visit: Payer: Self-pay

## 2015-01-05 MED ORDER — ENALAPRIL MALEATE 10 MG PO TABS
10.0000 mg | ORAL_TABLET | Freq: Two times a day (BID) | ORAL | Status: DC
Start: 2015-01-05 — End: 2016-01-04

## 2015-01-08 ENCOUNTER — Telehealth: Payer: Self-pay | Admitting: Internal Medicine

## 2015-01-08 NOTE — Telephone Encounter (Signed)
[facility] [place] called stating pt is wanting a refill for some lidocaine ointment and she will be faxing a request for this. The phone number is [phone] pts referrance # [id number]

## 2015-01-09 NOTE — Telephone Encounter (Signed)
Does he mean Anamantle HC cream (which has lidocaine in it)?

## 2015-01-12 ENCOUNTER — Ambulatory Visit (INDEPENDENT_AMBULATORY_CARE_PROVIDER_SITE_OTHER): Payer: PPO | Admitting: *Deleted

## 2015-01-12 DIAGNOSIS — I480 Paroxysmal atrial fibrillation: Secondary | ICD-10-CM

## 2015-01-12 NOTE — Telephone Encounter (Signed)
Noted. Unable to contact the patient, will wait for fax from pharmacy

## 2015-01-15 NOTE — Progress Notes (Signed)
LOOP RECORDER  

## 2015-02-12 LAB — CUP PACEART REMOTE DEVICE CHECK: Date Time Interrogation Session: 20161118230929

## 2015-02-13 ENCOUNTER — Ambulatory Visit (INDEPENDENT_AMBULATORY_CARE_PROVIDER_SITE_OTHER): Payer: PPO | Admitting: *Deleted

## 2015-02-13 DIAGNOSIS — I480 Paroxysmal atrial fibrillation: Secondary | ICD-10-CM | POA: Diagnosis not present

## 2015-02-13 NOTE — Progress Notes (Signed)
Carelink Summary Report / Loop Recorder 

## 2015-02-16 ENCOUNTER — Ambulatory Visit (INDEPENDENT_AMBULATORY_CARE_PROVIDER_SITE_OTHER): Payer: PPO | Admitting: Emergency Medicine

## 2015-02-16 VITALS — BP 152/88 | HR 93 | Temp 97.6°F | Resp 14 | Ht 66.25 in | Wt 180.0 lb

## 2015-02-16 DIAGNOSIS — I10 Essential (primary) hypertension: Secondary | ICD-10-CM

## 2015-02-16 DIAGNOSIS — J441 Chronic obstructive pulmonary disease with (acute) exacerbation: Secondary | ICD-10-CM

## 2015-02-16 DIAGNOSIS — I5032 Chronic diastolic (congestive) heart failure: Secondary | ICD-10-CM | POA: Diagnosis not present

## 2015-02-16 DIAGNOSIS — J0140 Acute pansinusitis, unspecified: Secondary | ICD-10-CM

## 2015-02-16 MED ORDER — HYDROCOD POLST-CPM POLST ER 10-8 MG/5ML PO SUER: 5.0000 mL | Freq: Two times a day (BID) | ORAL | Status: DC

## 2015-02-16 MED ORDER — LEVOFLOXACIN 500 MG PO TABS: 500.0000 mg | ORAL_TABLET | Freq: Every day | ORAL | Status: AC

## 2015-02-16 NOTE — Progress Notes (Signed)
Subjective:  Patient ID: Scott [name], male    DOB: [birth year]  Age: 59 y.o. MRN: [id number]  CC: Cough and Nasal Congestion   HPI  Scott [name] presents   Patient is a history of nasal congestion nasal discharge and postnasal drainage is purulent  In color. She has  Pressure inher cheeks and sinuses. She has no fever or chills. He's been fatigued and unable to do her normal activities daily life. She has a cough productive of purulent sputum. The exertional shortness breath due to mild wheezing. She has no nausea vomiting or stool change. No rash. She's had no improvement with over-the-counter medication  History Scott [name] has a past medical history of HYPOTHYROIDISM, ACQUIRED NEC (09/28/2006); HYPERLIPIDEMIA (09/28/2006); ANXIETY (09/28/2006); OBSTRUCTIVE SLEEP APNEA (01/06/2008); HYPERTENSION (09/28/2006); Atrial fibrillation ([place]) (09/28/2006); Diastolic dysfunction (04/29/6292); ALLERGIC RHINITIS (01/14/2007); Long term current use of anticoagulant (04/08/2010); Right knee DJD (07/09/2010); Asbestos exposure; Coronary artery disease (11/24/2011); S/P CABG x 1 (11/26/2011); S/P Maze operation for atrial fibrillation (11/26/2011); CHF (congestive heart failure) ([place]); COPD (chronic obstructive pulmonary disease) ([place]); and Gallstones.   He has past surgical history that includes hand surgury (Left); Radiofrequency ablation for atrial flutter (2005); Radiofrequency Ablation for atrial fibrillation (12/30/07 and 06/05/09); MAZE (11/26/2011); Coronary artery bypass graft (11/26/2011); left heart catheterization with coronary angiogram (N/A, 11/24/2011); and loop recorder implant (N/A, 07/06/2012).   His  family history includes Atrial fibrillation in his mother; Cancer in his paternal grandmother; Dementia in his father and paternal grandfather; Hypertension in his father; Stroke in his mother.  He   reports that he quit smoking about 19 years ago. His smoking use included Cigarettes. He has a 39 pack-year  smoking history. He has never used smokeless tobacco. He reports that he does not drink alcohol or use illicit drugs.  Outpatient Prescriptions Prior to Visit  Medication Sig Dispense Refill  . acetaminophen (TYLENOL) 500 MG tablet Take 500 mg by mouth every 6 (six) hours as needed for mild pain.     Marland Kitchen ALPRAZolam (XANAX) 0.5 MG tablet Take 1 tablet (0.5 mg total) by mouth 2 (two) times daily as needed. For anxiety 180 tablet 1  . aspirin EC 81 MG EC tablet Take 1 tablet (81 mg total) by mouth daily.    . budesonide-formoterol (SYMBICORT) 160-4.5 MCG/ACT inhaler Inhale 2 puffs into the lungs 2 (two) times daily. 3 Inhaler 1  . edoxaban (SAVAYSA) 60 MG TABS tablet Take 60 mg by mouth daily. 30 tablet 11  . enalapril (VASOTEC) 10 MG tablet Take 1 tablet (10 mg total) by mouth 2 (two) times daily. 180 tablet 3  . lovastatin (MEVACOR) 40 MG tablet Take 2 tablets (80 mg total) by mouth every evening. 180 tablet 3  . metoprolol succinate (TOPROL-XL) 50 MG 24 hr tablet Take 1 tablet (50 mg total) by mouth daily. Take with or immediately following a meal. 90 tablet 1  . temazepam (RESTORIL) 30 MG capsule Take 1 capsule (30 mg total) by mouth at bedtime as needed. For sleep 90 capsule 1  . traMADol (ULTRAM-ER) 300 MG 24 hr tablet Take 1 tablet (300 mg total) by mouth daily. 30 tablet 5  . Na Sulfate-K Sulfate-Mg Sulf (SUPREP BOWEL PREP) SOLN Take 1 kit by mouth once. (Patient not taking: Reported on 11/07/2014) 1 Bottle 0   No facility-administered medications prior to visit.    Social History   Social History  . Marital Status: Single    Spouse Name: N/A  . Number of  Children: 0  . Years of Education: N/A   Occupational History  . worked previously in the [employer] and feels that he was exposed to asbestos.    Social History Main Topics  . Smoking status: Former Smoker -- 1.50 packs/day for 26 years    Types: Cigarettes    Quit date: 04/06/1995  . Smokeless tobacco: Never Used     Comment:  not smoked over 10 years  . Alcohol Use: No  . Drug Use: No  . Sexual Activity: Not Currently   Other Topics Concern  . None   Social History Narrative   Pt lives in [place], [place] with his friend.      Review of Systems  Constitutional: Positive for activity change and fatigue. Negative for fever, chills and appetite change.  HENT: Positive for congestion, postnasal drip, rhinorrhea and sinus pressure. Negative for ear pain and sore throat.   Eyes: Negative for pain and redness.  Respiratory: Positive for cough, shortness of breath and wheezing.   Cardiovascular: Negative for leg swelling.  Gastrointestinal: Negative for nausea, vomiting, abdominal pain, diarrhea, constipation and blood in stool.  Endocrine: Negative for polyuria.  Genitourinary: Negative for dysuria, urgency, frequency and flank pain.  Musculoskeletal: Negative for gait problem.  Skin: Negative for rash.  Neurological: Negative for weakness and headaches.  Psychiatric/Behavioral: Negative for confusion and decreased concentration. The patient is not nervous/anxious.     Objective:  BP 152/88 mmHg  Pulse 93  Temp(Src) 97.6 F (36.4 C) (Oral)  Resp 14  Ht 5' 6.25" (1.683 m)  Wt 180 lb (81.647 kg)  BMI 28.83 kg/m2  SpO2 99%  Physical Exam  Constitutional: He is oriented to person, place, and time. He appears well-developed and well-nourished. No distress.  HENT:  Head: Normocephalic and atraumatic.  Right Ear: External ear normal.  Left Ear: External ear normal.  Nose: Nose normal.  Eyes: Conjunctivae and EOM are normal. Pupils are equal, round, and reactive to light. No scleral icterus.  Neck: Normal range of motion. Neck supple. No tracheal deviation present.  Cardiovascular: Normal rate, regular rhythm and normal heart sounds.   Pulmonary/Chest: Effort normal. No respiratory distress. He has no wheezes. He has no rales.  Abdominal: He exhibits no mass. There is no tenderness. There is no rebound  and no guarding.  Musculoskeletal: He exhibits no edema.  Lymphadenopathy:    He has no cervical adenopathy.  Neurological: He is alert and oriented to person, place, and time. Coordination normal.  Skin: Skin is warm and dry. No rash noted.  Psychiatric: He has a normal mood and affect. His behavior is normal.      Assessment & Plan:   Chaim was seen today for cough and nasal congestion.  Diagnoses and all orders for this visit:  Acute pansinusitis, recurrence not specified  COPD exacerbation ([place])  Essential hypertension  Chronic diastolic CHF (congestive heart failure) ([place])  Other orders -     chlorpheniramine-HYDROcodone (TUSSIONEX PENNKINETIC ER) 10-8 MG/5ML SUER; Take 5 mLs by mouth 2 (two) times daily. -     levofloxacin (LEVAQUIN) 500 MG tablet; Take 1 tablet (500 mg total) by mouth daily.  I have discontinued Scott [name]'s Na Sulfate-K Sulfate-Mg Sulf. I am also having him start on chlorpheniramine-HYDROcodone and levofloxacin. Additionally, I am having him maintain his aspirin, acetaminophen, edoxaban, metoprolol succinate, temazepam, budesonide-formoterol, traMADol, ALPRAZolam, lovastatin, enalapril, and Dextromethorphan-Guaifenesin (GUAIFENESIN DM PO).  Meds ordered this encounter  Medications  . Dextromethorphan-Guaifenesin (GUAIFENESIN DM PO)  Sig: Take by mouth.  . chlorpheniramine-HYDROcodone (TUSSIONEX PENNKINETIC ER) 10-8 MG/5ML SUER    Sig: Take 5 mLs by mouth 2 (two) times daily.    Dispense:  60 mL    Refill:  0  . levofloxacin (LEVAQUIN) 500 MG tablet    Sig: Take 1 tablet (500 mg total) by mouth daily.    Dispense:  10 tablet    Refill:  0    Appropriate red flag conditions were discussed with the patient as well as actions that should be taken.  Patient expressed his understanding.  Follow-up: Return if symptoms worsen or fail to improve.  Roselee Culver, MD

## 2015-02-16 NOTE — Patient Instructions (Signed)

## 2015-03-05 ENCOUNTER — Ambulatory Visit: Payer: PPO

## 2015-03-08 ENCOUNTER — Ambulatory Visit (INDEPENDENT_AMBULATORY_CARE_PROVIDER_SITE_OTHER): Payer: PPO

## 2015-03-08 DIAGNOSIS — Z23 Encounter for immunization: Secondary | ICD-10-CM | POA: Diagnosis not present

## 2015-03-13 ENCOUNTER — Ambulatory Visit (INDEPENDENT_AMBULATORY_CARE_PROVIDER_SITE_OTHER): Payer: PPO | Admitting: *Deleted

## 2015-03-13 DIAGNOSIS — I480 Paroxysmal atrial fibrillation: Secondary | ICD-10-CM

## 2015-03-14 NOTE — Progress Notes (Signed)
Carelink Summary Report / Loop Recorder 

## 2015-03-31 LAB — CUP PACEART REMOTE DEVICE CHECK: Date Time Interrogation Session: 20161218230850

## 2015-04-12 ENCOUNTER — Ambulatory Visit (INDEPENDENT_AMBULATORY_CARE_PROVIDER_SITE_OTHER): Payer: PPO | Admitting: *Deleted

## 2015-04-12 DIAGNOSIS — I480 Paroxysmal atrial fibrillation: Secondary | ICD-10-CM | POA: Diagnosis not present

## 2015-04-13 NOTE — Progress Notes (Signed)
Carelink Summary Report / Loop Recorder 

## 2015-04-28 ENCOUNTER — Other Ambulatory Visit: Payer: Self-pay | Admitting: Internal Medicine

## 2015-04-30 NOTE — Telephone Encounter (Signed)
Please advise patient is requesting refill

## 2015-04-30 NOTE — Progress Notes (Signed)
Carelink summary report received. Battery status OK. Normal device function. No new symptom episodes, tachy episodes, brady, or pause episodes. No new AF episodes. Monthly summary reports and ROV/PRN 

## 2015-04-30 NOTE — Telephone Encounter (Signed)
Done hardcopy to Corinne  

## 2015-04-30 NOTE — Telephone Encounter (Signed)
Faxed to pharmacy.   [facility] per pof

## 2015-05-02 LAB — CUP PACEART REMOTE DEVICE CHECK: MDC IDC SESS DTM: 20170216234048

## 2015-05-02 NOTE — Progress Notes (Signed)
Carelink summary report received. Battery status OK. Normal device function. No new symptom episodes, tachy episodes, brady, or pause episodes. No new AF episodes. Monthly summary reports and ROV/PRN 

## 2015-05-04 LAB — CUP PACEART REMOTE DEVICE CHECK: Date Time Interrogation Session: 20170117231056

## 2015-05-08 ENCOUNTER — Telehealth: Payer: Self-pay

## 2015-05-08 ENCOUNTER — Ambulatory Visit (INDEPENDENT_AMBULATORY_CARE_PROVIDER_SITE_OTHER): Payer: PPO | Admitting: Internal Medicine

## 2015-05-08 ENCOUNTER — Encounter: Payer: Self-pay | Admitting: Internal Medicine

## 2015-05-08 ENCOUNTER — Other Ambulatory Visit (INDEPENDENT_AMBULATORY_CARE_PROVIDER_SITE_OTHER): Payer: PPO

## 2015-05-08 VITALS — BP 146/90 | HR 76 | Temp 98.2°F | Resp 20 | Wt 191.0 lb

## 2015-05-08 DIAGNOSIS — R739 Hyperglycemia, unspecified: Secondary | ICD-10-CM | POA: Diagnosis not present

## 2015-05-08 DIAGNOSIS — I10 Essential (primary) hypertension: Secondary | ICD-10-CM | POA: Diagnosis not present

## 2015-05-08 DIAGNOSIS — Z0000 Encounter for general adult medical examination without abnormal findings: Secondary | ICD-10-CM

## 2015-05-08 DIAGNOSIS — R0602 Shortness of breath: Secondary | ICD-10-CM | POA: Diagnosis not present

## 2015-05-08 DIAGNOSIS — I480 Paroxysmal atrial fibrillation: Secondary | ICD-10-CM | POA: Diagnosis not present

## 2015-05-08 DIAGNOSIS — F411 Generalized anxiety disorder: Secondary | ICD-10-CM

## 2015-05-08 DIAGNOSIS — E785 Hyperlipidemia, unspecified: Secondary | ICD-10-CM

## 2015-05-08 DIAGNOSIS — E039 Hypothyroidism, unspecified: Secondary | ICD-10-CM

## 2015-05-08 LAB — LIPID PANEL
CHOLESTEROL: 151 mg/dL (ref 0–200)
HDL: 53 mg/dL (ref >39.00)
LDL Cholesterol: 80 mg/dL (ref 0–99)
NonHDL: 98.46
Total CHOL/HDL Ratio: 3
Triglycerides: 93 mg/dL (ref 0.0–149.0)
VLDL: 18.6 mg/dL (ref 0.0–40.0)

## 2015-05-08 LAB — CBC WITH DIFFERENTIAL/PLATELET
BASOS PCT: 0.4 % (ref 0.0–3.0)
Basophils Absolute: 0 10*3/uL (ref 0.0–0.1)
EOS PCT: 2.5 % (ref 0.0–5.0)
Eosinophils Absolute: 0.2 10*3/uL (ref 0.0–0.7)
HCT: 43.9 % (ref 39.0–52.0)
HEMOGLOBIN: 14.9 g/dL (ref 13.0–17.0)
LYMPHS ABS: 1 10*3/uL (ref 0.7–4.0)
Lymphocytes Relative: 12 % (ref 12.0–46.0)
MCHC: 33.9 g/dL (ref 30.0–36.0)
MCV: 91.7 fl (ref 78.0–100.0)
MONO ABS: 0.6 10*3/uL (ref 0.1–1.0)
Monocytes Relative: 6.8 % (ref 3.0–12.0)
NEUTROS PCT: 78.3 % — AB (ref 43.0–77.0)
Neutro Abs: 6.7 10*3/uL (ref 1.4–7.7)
Platelets: 214 10*3/uL (ref 150.0–400.0)
RBC: 4.79 Mil/uL (ref 4.22–5.81)
RDW: 13.4 % (ref 11.5–15.5)
WBC: 8.5 10*3/uL (ref 4.0–10.5)

## 2015-05-08 LAB — PSA: PSA: 0.98 ng/mL (ref 0.10–4.00)

## 2015-05-08 LAB — HEPATIC FUNCTION PANEL
ALK PHOS: 60 U/L (ref 39–117)
ALT: 22 U/L (ref 0–53)
AST: 25 U/L (ref 0–37)
Albumin: 4.1 g/dL (ref 3.5–5.2)
BILIRUBIN TOTAL: 0.7 mg/dL (ref 0.2–1.2)
Bilirubin, Direct: 0.1 mg/dL (ref 0.0–0.3)
Total Protein: 6.9 g/dL (ref 6.0–8.3)

## 2015-05-08 LAB — BASIC METABOLIC PANEL
BUN: 13 mg/dL (ref 6–23)
CALCIUM: 9.3 mg/dL (ref 8.4–10.5)
CO2: 28 mEq/L (ref 19–32)
CREATININE: 0.97 mg/dL (ref 0.40–1.50)
Chloride: 106 mEq/L (ref 96–112)
GFR: 83.89 mL/min (ref >60.00)
GLUCOSE: 114 mg/dL — AB (ref 70–99)
Potassium: 4.5 mEq/L (ref 3.5–5.1)
SODIUM: 143 meq/L (ref 135–145)

## 2015-05-08 LAB — TSH: TSH: 1.4 u[IU]/mL (ref 0.35–4.50)

## 2015-05-08 MED ORDER — METOPROLOL SUCCINATE ER 50 MG PO TB24
50.0000 mg | ORAL_TABLET | Freq: Every day | ORAL | Status: DC
Start: 2015-05-08 — End: 2015-05-08

## 2015-05-08 MED ORDER — ALPRAZOLAM 0.5 MG PO TABS: 0.5000 mg | ORAL_TABLET | Freq: Two times a day (BID) | ORAL | Status: DC | PRN

## 2015-05-08 MED ORDER — METOPROLOL SUCCINATE ER 50 MG PO TB24: ORAL_TABLET | ORAL | Status: DC

## 2015-05-08 NOTE — Telephone Encounter (Signed)
Orders placed.

## 2015-05-08 NOTE — Assessment & Plan Note (Signed)
By hx less well controlled recently, for TSH, also increase the toprol XL to 75 qd, refer cardiology, to f/u any worsening symptoms or concerns

## 2015-05-08 NOTE — Patient Instructions (Signed)
Your EKG was OK today  OK to increase the Toprol to 1 and 1/2 tabs (75 mg) per day  Please continue all other medications as before, and refills have been done if requested.  Please have the pharmacy call with any other refills you may need.  Please continue your efforts at being more active, low cholesterol diet, and weight control.  You are otherwise up to date with prevention measures today.  Please keep your appointments with your specialists as you may have planned  You will be contacted regarding the referral for: Dr Lovena Le  Please go to the LAB in the Basement (turn left off the elevator) for the tests to be done today  You will be contacted by phone if any changes need to be made immediately.  Otherwise, you will receive a letter about your results with an explanation, but please check with MyChart first.  Please remember to sign up for MyChart if you have not done so, as this will be important to you in the future with finding out test results, communicating by private email, and scheduling acute appointments online when needed.  Please return in 6 months, or sooner if needed

## 2015-05-08 NOTE — Assessment & Plan Note (Addendum)
Likely multifactorial, but assoc with heart racing this visit, then resolved, volume ok, ok to cont same tx, declines cxr for now  Note:  Total time for pt hx, exam, review of record with pt in the room, determination of diagnoses and plan for further eval and tx is > 40 min, with over 50% spent in coordination and counseling of patient

## 2015-05-08 NOTE — Progress Notes (Signed)
Subjective:    Patient ID: Scott [name], male    DOB: [birth year], 60 y.o.   MRN: [id number]  HPI  Here for wellness and f/u;  Overall doing ok;  Pt denies Chest pain, worsening SOB, DOE, wheezing, orthopnea, PND, worsening LE edema, palpitations, dizziness or syncope.  Except has felt poorly for 4-5 mo, worse in the last 3 mo with episodes of racing heart and sob up to 30 min.  Sees Dr Rayann Heman and Dr Lovena Le for cards.    Pt denies neurological change such as new headache, facial or extremity weakness.  Pt denies polydipsia, polyuria, or low sugar symptoms. Pt states overall good compliance with treatment and medications, good tolerability, and has been trying to follow appropriate diet.  Pt denies worsening depressive symptoms, suicidal ideation or panic. No fever, night sweats, wt loss, loss of appetite, or other constitutional symptoms.  Pt states good ability with ADL's, has low fall risk, home safety reviewed and adequate, no other significant changes in hearing or vision, and only occasionally active with exercise., wlaks up to 6 miles per day,  conts on savaysa for anticoag.  Denies hyper or hypo thyroid symptoms such as voice, skin or hair change. Past Medical History  Diagnosis Date  . HYPOTHYROIDISM, ACQUIRED NEC 09/28/2006    pt denies and doesn't know  . HYPERLIPIDEMIA 09/28/2006  . ANXIETY 09/28/2006  . OBSTRUCTIVE SLEEP APNEA 01/06/2008  . HYPERTENSION 09/28/2006  . Atrial fibrillation ([place]) 09/28/2006    s/p afib ablation x 2  . Diastolic dysfunction 99991111  . ALLERGIC RHINITIS 01/14/2007  . Long term current use of anticoagulant 04/08/2010  . Right knee DJD 07/09/2010  . Asbestos exposure   . Coronary artery disease 11/24/2011  . S/P CABG x 1 11/26/2011    Right internal mammary artery to right coronary artery  . S/P Maze operation for atrial fibrillation 11/26/2011    complete biatrial lesion set with clipping of LA appendage  . CHF (congestive heart failure) ([place])   . COPD (chronic  obstructive pulmonary disease) ([place])   . Gallstones    Past Surgical History  Procedure Laterality Date  . Hand surgury Left   . Radiofrequency ablation for atrial flutter  2005    [facility]  . Radiofrequency ablation for atrial fibrillation  12/30/07 and 06/05/09    afib ablation x 2 by JA  . Maze  11/26/2011    Procedure: MAZE;  Surgeon: Rexene Alberts, MD;  Location: [facility];  Service: Open Heart Surgery;  Laterality: N/A;  [place]   . Coronary artery bypass graft  11/26/2011    Procedure: CORONARY ARTERY BYPASS GRAFTING (CABG);  Surgeon: Rexene Alberts, MD;  Location: [facility];  Service: Open Heart Surgery;  Laterality: N/A;  coronary artery bypass on pump times one utilizing the right internal mammary artery, transesophageal echocardiogram   . Left heart catheterization with coronary angiogram N/A 11/24/2011    Procedure: LEFT HEART CATHETERIZATION WITH CORONARY ANGIOGRAM;  Surgeon: Peter M Martinique, MD;  [facility] LAB;  Service: Cardiovascular;  Laterality: N/A;  . Loop recorder implant N/A 07/06/2012    Procedure: LOOP RECORDER IMPLANT;  Surgeon: Thompson Grayer, MD;  Location: [facility] CATH LAB;  Service: Cardiovascular;  Laterality: N/A;    reports that he quit smoking about 20 years ago. His smoking use included Cigarettes. He has a 39 pack-year smoking history. He has never used smokeless tobacco. He reports that he does not drink alcohol or use illicit drugs. family history  includes Atrial fibrillation in his mother; Cancer in his paternal grandmother; Dementia in his father and paternal grandfather; Hypertension in his father; Stroke in his mother. Allergies  Allergen Reactions  . Ambien [Zolpidem Tartrate]   . Zolpidem Tartrate Other (See Comments)    Extremely drowsy the next day   Current Outpatient Prescriptions on File Prior to Visit  Medication Sig Dispense Refill  . acetaminophen (TYLENOL) 500 MG tablet Take 500 mg by mouth every 6 (six) hours as needed for mild pain.     Marland Kitchen  ALPRAZolam (XANAX) 0.5 MG tablet Take 1 tablet (0.5 mg total) by mouth 2 (two) times daily as needed. For anxiety 180 tablet 1  . aspirin EC 81 MG EC tablet Take 1 tablet (81 mg total) by mouth daily.    . budesonide-formoterol (SYMBICORT) 160-4.5 MCG/ACT inhaler Inhale 2 puffs into the lungs 2 (two) times daily. 3 Inhaler 1  . chlorpheniramine-HYDROcodone (TUSSIONEX PENNKINETIC ER) 10-8 MG/5ML SUER Take 5 mLs by mouth 2 (two) times daily. 60 mL 0  . Dextromethorphan-Guaifenesin (GUAIFENESIN DM PO) Take by mouth.    . edoxaban (SAVAYSA) 60 MG TABS tablet Take 60 mg by mouth daily. 30 tablet 11  . enalapril (VASOTEC) 10 MG tablet Take 1 tablet (10 mg total) by mouth 2 (two) times daily. 180 tablet 3  . lovastatin (MEVACOR) 40 MG tablet Take 2 tablets (80 mg total) by mouth every evening. 180 tablet 3  . temazepam (RESTORIL) 30 MG capsule TAKE ONE CAPSULE BY MOUTH AT BEDTIME PRN FOR SLEEP 90 capsule 1  . traMADol (ULTRAM-ER) 300 MG 24 hr tablet Take 1 tablet (300 mg total) by mouth daily. 30 tablet 5   No current facility-administered medications on file prior to visit.      Review of Systems Constitutional: Negative for increased diaphoresis, other activity, appetite or siginficant weight change other than noted HENT: Negative for worsening hearing loss, ear pain, facial swelling, mouth sores and neck stiffness.   Eyes: Negative for other worsening pain, redness or visual disturbance.  Respiratory: Negative for shortness of breath and wheezing  Cardiovascular: Negative for chest pain and palpitations.  Gastrointestinal: Negative for diarrhea, blood in stool, abdominal distention or other pain Genitourinary: Negative for hematuria, flank pain or change in urine volume.  Musculoskeletal: Negative for myalgias or other joint complaints.  Skin: Negative for color change and wound or drainage.  Neurological: Negative for syncope and numbness. other than noted Hematological: Negative for  adenopathy. or other swelling Psychiatric/Behavioral: Negative for hallucinations, SI, self-injury, decreased concentration or other worsening agitation.      Objective:   Physical Exam BP 146/90 mmHg  Pulse 76  Temp(Src) 98.2 F (36.8 C) (Oral)  Resp 20  Wt 191 lb (86.637 kg)  SpO2 97% VS noted,  Constitutional: Pt is oriented to person, place, and time. Appears well-developed and well-nourished, in no significant distress Head: Normocephalic and atraumatic.  Right Ear: External ear normal.  Left Ear: External ear normal.  Nose: Nose normal.  Mouth/Throat: Oropharynx is clear and moist.  Eyes: Conjunctivae and EOM are normal. Pupils are equal, round, and reactive to light.  Neck: Normal range of motion. Neck supple. No JVD present. No tracheal deviation present or significant neck LA or mass Cardiovascular: Normal rate, regular rhythm, normal heart sounds and intact distal pulses.   Pulmonary/Chest: Effort normal and breath sounds without rales or wheezing  Abdominal: Soft. Bowel sounds are normal. NT. No HSM  Musculoskeletal: Normal range of motion. Exhibits no edema.  Lymphadenopathy:  Has no cervical adenopathy.  Neurological: Pt is alert and oriented to person, place, and time. Pt has normal reflexes. No cranial nerve deficit. Motor grossly intact Skin: Skin is warm and dry. No rash noted.  Psychiatric:  Has mild nervous mood and affect. Behavior is normal.   Lab Results  Component Value Date   WBC 7.4 04/04/2014   HGB 15.4 04/04/2014   HCT 44.9 04/04/2014   PLT 188.0 04/04/2014   GLUCOSE 94 11/07/2014   CHOL 131 11/07/2014   TRIG 43.0 11/07/2014   HDL 48.00 11/07/2014   LDLCALC 74 11/07/2014   ALT 14 11/07/2014   AST 19 11/07/2014   NA 143 11/07/2014   K 4.5 11/07/2014   CL 108 11/07/2014   CREATININE 0.93 11/07/2014   BUN 12 11/07/2014   CO2 29 11/07/2014   TSH 1.20 04/04/2014   PSA 0.62 04/04/2014   INR 1.64* 12/14/2013   HGBA1C 5.1 11/07/2014         Assessment & Plan:

## 2015-05-08 NOTE — Assessment & Plan Note (Signed)

## 2015-05-08 NOTE — Assessment & Plan Note (Signed)
stable overall by history and exam, recent data reviewed with pt, and pt to continue medical treatment as before,  to f/u any worsening symptoms or concerns BP Readings from Last 3 Encounters:  05/08/15 146/90  02/16/15 152/88  11/07/14 126/80

## 2015-05-08 NOTE — Assessment & Plan Note (Signed)
stable overall by history and exam, recent data reviewed with pt, and pt to continue medical treatment as before,  to f/u any worsening symptoms or concerns, for f/u TSH 

## 2015-05-08 NOTE — Progress Notes (Signed)
Pre visit review using our clinic review tool, if applicable. No additional management support is needed unless otherwise documented below in the visit note. 

## 2015-05-08 NOTE — Assessment & Plan Note (Signed)
stable overall by history and exam, recent data reviewed with pt, and pt to continue medical treatment as before,  to f/u any worsening symptoms or concerns, for med refill 

## 2015-05-08 NOTE — Assessment & Plan Note (Signed)
stable overall by history and exam, recent data reviewed with pt, and pt to continue medical treatment as before,  to f/u any worsening symptoms or concerns Lab Results  Component Value Date   LDLCALC 74 11/07/2014   For f/u lab

## 2015-05-14 ENCOUNTER — Ambulatory Visit (INDEPENDENT_AMBULATORY_CARE_PROVIDER_SITE_OTHER): Payer: PPO | Admitting: *Deleted

## 2015-05-14 DIAGNOSIS — I480 Paroxysmal atrial fibrillation: Secondary | ICD-10-CM

## 2015-05-15 NOTE — Progress Notes (Signed)
Carelink Summary Report / Loop Recorder 

## 2015-06-07 ENCOUNTER — Ambulatory Visit (INDEPENDENT_AMBULATORY_CARE_PROVIDER_SITE_OTHER): Payer: PPO | Admitting: Internal Medicine

## 2015-06-07 ENCOUNTER — Encounter: Payer: Self-pay | Admitting: Internal Medicine

## 2015-06-07 VITALS — BP 158/84 | HR 64 | Ht 66.0 in | Wt 191.2 lb

## 2015-06-07 DIAGNOSIS — I480 Paroxysmal atrial fibrillation: Secondary | ICD-10-CM

## 2015-06-07 LAB — CUP PACEART INCLINIC DEVICE CHECK: Date Time Interrogation Session: 20170413133816

## 2015-06-07 NOTE — Progress Notes (Signed)
HPI [name] returns today for followup. He is a very pleasant middle-age man with a history of atrial fibrillation and flutter, status post multiple ablations, who also has coronary disease, and underwent single-vessel bypass and maze surgery just a year ago. In the interim he has been stable except he does have dyspnea with exertion. No he denies chest pain. No peripheral edema. His weight has not changed. He is trying to lose weight. He walks up to 4 miles a day.   Allergies  Allergen Reactions  . Ambien [Zolpidem Tartrate]   . Zolpidem Tartrate Other (See Comments)    Extremely drowsy the next day     Current Outpatient Prescriptions  Medication Sig Dispense Refill  . acetaminophen (TYLENOL) 500 MG tablet Take 500 mg by mouth every 6 (six) hours as needed for mild pain.     Marland Kitchen ALPRAZolam (XANAX) 0.5 MG tablet Take 1 tablet (0.5 mg total) by mouth 2 (two) times daily as needed. For anxiety 180 tablet 1  . aspirin EC 81 MG EC tablet Take 1 tablet (81 mg total) by mouth daily.    . budesonide-formoterol (SYMBICORT) 160-4.5 MCG/ACT inhaler Inhale 2 puffs into the lungs 2 (two) times daily. 3 Inhaler 1  . chlorpheniramine-HYDROcodone (TUSSIONEX PENNKINETIC ER) 10-8 MG/5ML SUER Take 5 mLs by mouth 2 (two) times daily. 60 mL 0  . Dextromethorphan-Guaifenesin (GUAIFENESIN DM PO) Take by mouth.    . edoxaban (SAVAYSA) 60 MG TABS tablet Take 60 mg by mouth daily. 30 tablet 11  . enalapril (VASOTEC) 10 MG tablet Take 1 tablet (10 mg total) by mouth 2 (two) times daily. 180 tablet 3  . lovastatin (MEVACOR) 40 MG tablet Take 2 tablets (80 mg total) by mouth every evening. 180 tablet 3  . metoprolol succinate (TOPROL-XL) 50 MG 24 hr tablet 1 and 1/2 tab by mouth per day 135 tablet 3  . temazepam (RESTORIL) 30 MG capsule TAKE ONE CAPSULE BY MOUTH AT BEDTIME PRN FOR SLEEP 90 capsule 1  . traMADol (ULTRAM-ER) 300 MG 24 hr tablet Take 1 tablet (300 mg total) by mouth daily. 30 tablet 5   No  current facility-administered medications for this visit.     Past Medical History  Diagnosis Date  . HYPOTHYROIDISM, ACQUIRED NEC 09/28/2006    pt denies and doesn't know  . HYPERLIPIDEMIA 09/28/2006  . ANXIETY 09/28/2006  . OBSTRUCTIVE SLEEP APNEA 01/06/2008  . HYPERTENSION 09/28/2006  . Atrial fibrillation ([place]) 09/28/2006    s/p afib ablation x 2  . Diastolic dysfunction 99991111  . ALLERGIC RHINITIS 01/14/2007  . Long term current use of anticoagulant 04/08/2010  . Right knee DJD 07/09/2010  . Asbestos exposure   . Coronary artery disease 11/24/2011  . S/P CABG x 1 11/26/2011    Right internal mammary artery to right coronary artery  . S/P Maze operation for atrial fibrillation 11/26/2011    complete biatrial lesion set with clipping of LA appendage  . CHF (congestive heart failure) ([place])   . COPD (chronic obstructive pulmonary disease) ([place])   . Gallstones     ROS:   All systems reviewed and negative except as noted in the HPI.   Past Surgical History  Procedure Laterality Date  . Hand surgury Left   . Radiofrequency ablation for atrial flutter  2005    CTI  . Radiofrequency ablation for atrial fibrillation  12/30/07 and 06/05/09    afib ablation x 2 by JA  . Maze  11/26/2011  Procedure: MAZE;  Surgeon: Rexene Alberts, MD;  Location: [place];  Service: Open Heart Surgery;  Laterality: N/A;  [place]   . Coronary artery bypass graft  11/26/2011    Procedure: CORONARY ARTERY BYPASS GRAFTING (CABG);  Surgeon: Rexene Alberts, MD;  Location: [place];  Service: Open Heart Surgery;  Laterality: N/A;  coronary artery bypass on pump times one utilizing the right internal mammary artery, transesophageal echocardiogram   . Left heart catheterization with coronary angiogram N/A 11/24/2011    Procedure: LEFT HEART CATHETERIZATION WITH CORONARY ANGIOGRAM;  Surgeon: Peter M Martinique, MD;  [facility];  Service: Cardiovascular;  Laterality: N/A;  . Loop recorder implant N/A 07/06/2012     Procedure: LOOP RECORDER IMPLANT;  Surgeon: Thompson Grayer, MD;  Location: [facility] CATH LAB;  Service: Cardiovascular;  Laterality: N/A;     Family History  Problem Relation Age of Onset  . Dementia Father   . Hypertension Father   . Atrial fibrillation Mother   . Stroke Mother   . Dementia Paternal Grandfather   . Cancer Paternal Grandmother     type unknown     Social History   Social History  . Marital Status: Single    Spouse Name: N/A  . Number of Children: 0  . Years of Education: N/A   Occupational History  . worked previously in the [employer] and feels that he was exposed to asbestos.    Social History Main Topics  . Smoking status: Former Smoker -- 1.50 packs/day for 26 years    Types: Cigarettes    Quit date: 04/06/1995  . Smokeless tobacco: Never Used     Comment: not smoked over 10 years  . Alcohol Use: No  . Drug Use: No  . Sexual Activity: Not Currently   Other Topics Concern  . Not on file   Social History Narrative   Pt lives in [place], [place] with his friend.      BP 158/84 mmHg  Pulse 64  Ht 5\' 6"  (1.676 m)  Wt 191 lb 3.2 oz (86.728 kg)  BMI 30.88 kg/m2  Physical Exam:  Well appearing 60 yo man, NAD HEENT: Unremarkable Neck:  6 cm JVD, no thyromegally Back:  No CVA tenderness Lungs:  Clear with no wheezes HEART:  Regular rate rhythm, no murmurs, no rubs, no clicks Abd:  soft, positive bowel sounds, no organomegally, no rebound, no guarding Ext:  2 plus pulses, no edema, no cyanosis, no clubbing Skin:  No rashes no nodules Neuro:  CN II through XII intact, motor grossly intact   DEVICE  Normal device function.  ILR. See PaceArt for details.   Assess/Plan: 1. Atrial fib - he is maintaining nsr. No change in meds. 2. HTN - his blood pressure is elevated. He will work on his salt consumption. 3. Chronic diastolic heart failure - his symptoms are class 2. Will follow.  Mikle Bosworth.D.

## 2015-06-07 NOTE — Patient Instructions (Signed)

## 2015-06-08 LAB — CUP PACEART REMOTE DEVICE CHECK: MDC IDC SESS DTM: 20170318234122

## 2015-06-11 ENCOUNTER — Ambulatory Visit (INDEPENDENT_AMBULATORY_CARE_PROVIDER_SITE_OTHER): Payer: PPO | Admitting: *Deleted

## 2015-06-11 DIAGNOSIS — I480 Paroxysmal atrial fibrillation: Secondary | ICD-10-CM

## 2015-06-12 NOTE — Progress Notes (Signed)
Carelink Summary Report / Loop Recorder 

## 2015-06-18 ENCOUNTER — Other Ambulatory Visit: Payer: Self-pay | Admitting: Internal Medicine

## 2015-06-18 NOTE — Telephone Encounter (Signed)
Please advise 

## 2015-06-19 NOTE — Telephone Encounter (Signed)
Medication sent to pharmacy  

## 2015-06-19 NOTE — Telephone Encounter (Signed)
Done hardcopy to Corinne  

## 2015-06-30 ENCOUNTER — Encounter: Payer: Self-pay | Admitting: Internal Medicine

## 2015-07-04 ENCOUNTER — Telehealth: Payer: Self-pay

## 2015-07-04 NOTE — Telephone Encounter (Signed)
Please advise did we do this?

## 2015-07-04 NOTE — Telephone Encounter (Signed)
I cant recall, but I would verify with pt that bilat knee brace is actually needed, then ask [employer] to send the corrected form to be signed

## 2015-07-04 NOTE — Telephone Encounter (Signed)
[facility] received a fax for knee brace.  They are requesting it to be listed a bilateral knee brace.

## 2015-07-05 NOTE — Telephone Encounter (Signed)
Called patient, unable to reach left message to give us a call back. 

## 2015-07-06 ENCOUNTER — Other Ambulatory Visit: Payer: Self-pay | Admitting: Internal Medicine

## 2015-07-10 DIAGNOSIS — M1711 Unilateral primary osteoarthritis, right knee: Secondary | ICD-10-CM | POA: Diagnosis not present

## 2015-07-10 DIAGNOSIS — M1712 Unilateral primary osteoarthritis, left knee: Secondary | ICD-10-CM | POA: Diagnosis not present

## 2015-07-11 ENCOUNTER — Ambulatory Visit (INDEPENDENT_AMBULATORY_CARE_PROVIDER_SITE_OTHER): Payer: PPO | Admitting: *Deleted

## 2015-07-11 DIAGNOSIS — I480 Paroxysmal atrial fibrillation: Secondary | ICD-10-CM | POA: Diagnosis not present

## 2015-07-13 NOTE — Progress Notes (Signed)
Carelink Summary Report / Loop Recorder 

## 2015-07-23 LAB — CUP PACEART REMOTE DEVICE CHECK: MDC IDC SESS DTM: 20170418000852

## 2015-07-23 NOTE — Progress Notes (Signed)
Carelink summary report received. Battery status OK. Normal device function. No new symptom episodes, tachy episodes, brady, or pause episodes. No new AF episodes. Monthly summary reports and ROV/PRN 

## 2015-08-10 ENCOUNTER — Ambulatory Visit (INDEPENDENT_AMBULATORY_CARE_PROVIDER_SITE_OTHER): Payer: PPO | Admitting: *Deleted

## 2015-08-10 ENCOUNTER — Other Ambulatory Visit: Payer: Self-pay

## 2015-08-10 DIAGNOSIS — I480 Paroxysmal atrial fibrillation: Secondary | ICD-10-CM

## 2015-08-10 NOTE — Patient Outreach (Signed)
[facility]) Care Management  08/10/2015  Scott [name] May 02, 1955 [id number]   Telephone call to patient for Heart Healthy Chronic Care Program.  No answer.  HIPAA compliant voice message left.    Plan: RN Health Coach will attempt patient within 2 weeks.    [name], RN, MSN [place] [phone]

## 2015-08-13 NOTE — Progress Notes (Signed)
Carelink Summary Report / Loop Recorder 

## 2015-08-14 ENCOUNTER — Other Ambulatory Visit: Payer: Self-pay

## 2015-08-14 LAB — CUP PACEART REMOTE DEVICE CHECK: Date Time Interrogation Session: 20170518003813

## 2015-08-14 NOTE — Patient Outreach (Signed)
[facility]) Care Management  [place]  08/14/2015   Nathainel Morone [name] 1955/09/21 [id number]  Subjective: Telephone call to patient for initial assessment. Patient reports he is interested in program to help him maintain his atrial fibrillation.  Patient reports history on 4 cardioversion's, maze procedure with CABG, and ablation.  Patient report he does have short spells of atrial fibrillation but heart rhythm returned quickly.  He states he knows that is normal.  Discussed with patient atrial fibrillation and when to notify physician. He verbalized understanding. Patient reports he tries to maintain his diet of low fat/ low salt. Patient deals with pain in knees and back for which he takes tramadol.   Social: Patient lives alone and it independent with all activities of daily living.  Patient works part-time at the [employer]. He has family in [place].    Objective:   Encounter Medications:  Outpatient Encounter Prescriptions as of 08/14/2015  Medication Sig  . acetaminophen (TYLENOL) 500 MG tablet Take 500 mg by mouth every 6 (six) hours as needed for mild pain.   Marland Kitchen ALPRAZolam (XANAX) 0.5 MG tablet Take 1 tablet (0.5 mg total) by mouth 2 (two) times daily as needed. For anxiety  . aspirin EC 81 MG EC tablet Take 1 tablet (81 mg total) by mouth daily.  Marland Kitchen edoxaban (SAVAYSA) 60 MG TABS tablet Take 60 mg by mouth daily.  . enalapril (VASOTEC) 10 MG tablet Take 1 tablet (10 mg total) by mouth 2 (two) times daily.  Marland Kitchen lovastatin (MEVACOR) 40 MG tablet Take 2 tablets (80 mg total) by mouth every evening.  . metoprolol succinate (TOPROL-XL) 50 MG 24 hr tablet 1 and 1/2 tab by mouth per day  . SYMBICORT 160-4.5 MCG/ACT inhaler INHALE 2 PUFFS INTO THE LUNGS 2 (TWO) TIMES DAILY.  Marland Kitchen temazepam (RESTORIL) 30 MG capsule TAKE ONE CAPSULE BY MOUTH AT BEDTIME PRN FOR SLEEP  . traMADol (ULTRAM-ER) 300 MG 24 hr tablet TAKE 1 TABLET EVERY DAY  . chlorpheniramine-HYDROcodone  (TUSSIONEX PENNKINETIC ER) 10-8 MG/5ML SUER Take 5 mLs by mouth 2 (two) times daily. (Patient not taking: Reported on 08/14/2015)  . Dextromethorphan-Guaifenesin (GUAIFENESIN DM PO) Take by mouth. Reported on 08/14/2015   No facility-administered encounter medications on file as of 08/14/2015.    Functional Status:  In your present state of health, do you have any difficulty performing the following activities: 08/14/2015  Hearing? N  Vision? N  Difficulty concentrating or making decisions? N  Walking or climbing stairs? N  Dressing or bathing? N  Doing errands, shopping? N  Preparing Food and eating ? N  Using the Toilet? N  In the past six months, have you accidently leaked urine? N  Do you have problems with loss of bowel control? N  Managing your Medications? N  Managing your Finances? N  Housekeeping or managing your Housekeeping? N    Fall/Depression Screening: PHQ 2/9 Scores 08/14/2015 05/08/2015 02/16/2015 07/13/2013 06/04/2012 02/17/2012  PHQ - 2 Score 0 0 0 1 1 1     Assessment: Patient will benefit from health coach outreach for A. Fibrillation disease management and support.  Plan:  [facility] CM Care Plan Problem One        Most Recent Value   Care Plan Problem One  Knowledge deficit Atrial Fibrillation   Role Documenting the Problem One  [place] for Problem One  Active   THN Long Term Goal (31-90 days)  Patient will report knowing signs and  symptoms of acute atrial fibrillation and when to notify physician within 90 days.   THN Long Term Goal Start Date  08/14/15   Interventions for Problem One [place] discussed with patient signs and symptoms of atrial fibrillation.   THN CM Short Term Goal #1 (0-30 days)  Patient will report maintaining diet low salt/low fat diet within 30 days.   THN CM Short Term Goal #1 Start Date  08/14/15   Interventions for Short Term Goal #1  RN Health Coach discussed with patient food high in salt and high in  fat.   THN CM Short Term Goal #2 (0-30 days)  Patient report maintaining exercise at least 3 days a week within 30 days.   THN CM Short Term Goal #2 Start Date  08/14/15   Interventions for Short Term Goal #2  RN Health Coach discussed the importance of exercise in maintaining heart health.       RN Health Coach will provide ongoing education for patient on atrial fibrillation through phone calls and sending printed information to patient for further discussion.  RN Health Coach will send welcome packet with consent to patient as well as printed information on atrial fibrillation.  RN Health Coach will send initial barriers letter, assessment, and care plan to primary care physician.  RN Health Coach will contact patient within one month and patient agrees to next contact.   [name], RN, MSN [place] [phone]

## 2015-08-18 ENCOUNTER — Other Ambulatory Visit: Payer: Self-pay | Admitting: Internal Medicine

## 2015-08-21 ENCOUNTER — Ambulatory Visit: Payer: Self-pay

## 2015-09-05 ENCOUNTER — Telehealth: Payer: Self-pay | Admitting: *Deleted

## 2015-09-05 ENCOUNTER — Encounter: Payer: Self-pay | Admitting: Internal Medicine

## 2015-09-05 NOTE — Telephone Encounter (Signed)
Called patient regarding tachy episode on [facility] transmission from 09/04/15, duration 4 sec.  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  He did not feel overheated yesterday anytime near the time of this episode.  Advised patient that Dr. Lovena Le will review the episode and if he has any further recommendations, that I will call him back.  Advised patient to ensure he is taking in enough fluids and electrolytes when he is working outside.  Patient verbalizes understanding of instructions.  He is aware to call with worsening symptoms, questions, or concerns and denies additional concerns at this time.

## 2015-09-07 LAB — CUP PACEART REMOTE DEVICE CHECK: Date Time Interrogation Session: 20170617003830

## 2015-09-10 ENCOUNTER — Ambulatory Visit (INDEPENDENT_AMBULATORY_CARE_PROVIDER_SITE_OTHER): Payer: PPO | Admitting: *Deleted

## 2015-09-10 DIAGNOSIS — I480 Paroxysmal atrial fibrillation: Secondary | ICD-10-CM

## 2015-09-10 NOTE — Progress Notes (Signed)
Carelink Summary Report / Loop Recorder 

## 2015-09-11 ENCOUNTER — Other Ambulatory Visit: Payer: Self-pay

## 2015-09-11 NOTE — Patient Outreach (Signed)
[facility]) Care Management  [place]  09/11/2015   Ishmael Paller [name] 01-09-1956 [id number]  Subjective: Telephone call to patient for monthly call.  Patient reports he is doing ok.  Patient had an episode of atrial fibrillation but patient reports he was asymptomatic during that time but picked from his loop recorder.The cardiologist made no changes in treatment.  Patient reports he is normally symptomatic.  Discussed with patient signs and symptoms episodes. He verbalized understanding. Patient continues to work at the [employer] and states he tries to stay hydrated and normally does not work hard. Discussed with patient EMMI information sent and consent.  Patient reports he has sent his consent back.   Objective:   Encounter Medications:  Outpatient Encounter Prescriptions as of 09/11/2015  Medication Sig  . acetaminophen (TYLENOL) 500 MG tablet Take 500 mg by mouth every 6 (six) hours as needed for mild pain.   Marland Kitchen ALPRAZolam (XANAX) 0.5 MG tablet Take 1 tablet (0.5 mg total) by mouth 2 (two) times daily as needed. For anxiety  . aspirin EC 81 MG EC tablet Take 1 tablet (81 mg total) by mouth daily.  Marland Kitchen Dextromethorphan-Guaifenesin (GUAIFENESIN DM PO) Take by mouth. Reported on 08/14/2015  . enalapril (VASOTEC) 10 MG tablet Take 1 tablet (10 mg total) by mouth 2 (two) times daily.  Marland Kitchen lovastatin (MEVACOR) 40 MG tablet Take 2 tablets (80 mg total) by mouth every evening.  . metoprolol succinate (TOPROL-XL) 50 MG 24 hr tablet 1 and 1/2 tab by mouth per day  . SAVAYSA 60 MG TABS tablet TAKE 60 MG BY MOUTH DAILY.  . SYMBICORT 160-4.5 MCG/ACT inhaler INHALE 2 PUFFS INTO THE LUNGS 2 (TWO) TIMES DAILY.  Marland Kitchen temazepam (RESTORIL) 30 MG capsule TAKE ONE CAPSULE BY MOUTH AT BEDTIME PRN FOR SLEEP  . traMADol (ULTRAM-ER) 300 MG 24 hr tablet TAKE 1 TABLET EVERY DAY  . chlorpheniramine-HYDROcodone (TUSSIONEX PENNKINETIC ER) 10-8 MG/5ML SUER Take 5 mLs by mouth 2 (two) times daily.  (Patient not taking: Reported on 08/14/2015)   No facility-administered encounter medications on file as of 09/11/2015.    Functional Status:  In your present state of health, do you have any difficulty performing the following activities: 08/14/2015  Hearing? N  Vision? N  Difficulty concentrating or making decisions? N  Walking or climbing stairs? N  Dressing or bathing? N  Doing errands, shopping? N  Preparing Food and eating ? N  Using the Toilet? N  In the past six months, have you accidently leaked urine? N  Do you have problems with loss of bowel control? N  Managing your Medications? N  Managing your Finances? N  Housekeeping or managing your Housekeeping? N    Fall/Depression Screening: PHQ 2/9 Scores 09/11/2015 08/14/2015 05/08/2015 02/16/2015 07/13/2013 06/04/2012 02/17/2012  PHQ - 2 Score 0 0 0 0 1 1 1     Assessment: Patient continues to benefit from health coach outreach for disease management and support.    Plan:  [facility] CM Care Plan Problem One        Most Recent Value   Care Plan Problem One  Knowledge deficit Atrial Fibrillation   Role Documenting the Problem One  [place] for Problem One  Active   THN Long Term Goal (31-90 days)  Patient will report knowing signs and symptoms of acute atrial fibrillation and when to notify physician within 90 days.   THN Long Term Goal Start Date  08/14/15   Interventions  for Problem One [place] reviewed with patient signs and symptoms of atrial fibrillation.   THN CM Short Term Goal #1 (0-30 days)  Patient will report maintaining diet low salt/low fat diet within 30 days.   THN CM Short Term Goal #1 Start Date  09/11/15 [goal continued]   Interventions for Short Term Goal #1  RN Health Coach reviewed with patient food high in salt and high in fat.   THN CM Short Term Goal #2 (0-30 days)  Patient report maintaining exercise at least 3 days a week within 30 days.   THN CM Short Term Goal #2  Start Date  09/11/15 [goal continued]   Interventions for Short Term Goal #2  RN Health Coach reviewed the importance of exercise in maintaining heart health.       RN Health Coach will contact patient in the month of August and patient agrees to next outreach.  [name], RN, MSN [place] [phone]

## 2015-10-09 ENCOUNTER — Other Ambulatory Visit: Payer: Self-pay

## 2015-10-09 ENCOUNTER — Ambulatory Visit (INDEPENDENT_AMBULATORY_CARE_PROVIDER_SITE_OTHER): Payer: PPO | Admitting: *Deleted

## 2015-10-09 DIAGNOSIS — I480 Paroxysmal atrial fibrillation: Secondary | ICD-10-CM | POA: Diagnosis not present

## 2015-10-09 NOTE — Patient Outreach (Signed)
[facility]) Care Management  10/09/2015  Scott [name] 09-17-1955 [id number]   Telephone call to patient for monthly call.  No answer. HIPAA compliant voice message left.   Plan: RN Health Coach will attempt patient again in the month of August.   [name] J Meade [place], RN, MSN [place] [phone]

## 2015-10-10 NOTE — Progress Notes (Signed)
Carelink Summary Report / Loop Recorder 

## 2015-10-11 LAB — CUP PACEART REMOTE DEVICE CHECK: Date Time Interrogation Session: 20170717013651

## 2015-10-16 ENCOUNTER — Other Ambulatory Visit: Payer: Self-pay | Admitting: Internal Medicine

## 2015-10-22 ENCOUNTER — Telehealth: Payer: Self-pay | Admitting: Internal Medicine

## 2015-10-22 NOTE — Telephone Encounter (Signed)
New message   Pt verbalized that he noticed a expiration date for his device and he wants to talk to the rn about it

## 2015-10-22 NOTE — Telephone Encounter (Signed)
Returned patient's call.  Advised patient that a LINQ battery typically lasts about 3 years, in his case a little more.  When the battery depletes to RRT/EOS, patient would like to schedule an appointment to discuss future monitoring.  Advised patient that if he wishes, ILR can be left in place or can be extracted and Dr. Lovena Le will discuss this with him at that appointment.  Patient verbalizes understanding and is aware that our office will call him when RRT is reached.  Patient is appreciative and denies additional questions or concerns at this time.

## 2015-10-24 ENCOUNTER — Other Ambulatory Visit: Payer: Self-pay

## 2015-10-24 NOTE — Patient Outreach (Addendum)
[facility]) Care Management  [place]  10/24/2015   [place] Serpe [name] [birth year] [id number]  Subjective: Telephone call to patient for monthly call. Patient reports he is doing good.  Patient remaining active working at the [employer]. Patient denies any episodes of Atrial Fibrillation. Discussed with patient signs of atrial fibrillation and when to notify physician.  He verbalized understanding.  Discussed with calling every other month for outreach.  Patient agreeable.   Objective:   Encounter Medications:  Outpatient Encounter Prescriptions as of 10/24/2015  Medication Sig  . acetaminophen (TYLENOL) 500 MG tablet Take 500 mg by mouth every 6 (six) hours as needed for mild pain.   Marland Kitchen ALPRAZolam (XANAX) 0.5 MG tablet Take 1 tablet (0.5 mg total) by mouth 2 (two) times daily as needed. For anxiety  . aspirin EC 81 MG EC tablet Take 1 tablet (81 mg total) by mouth daily.  Marland Kitchen Dextromethorphan-Guaifenesin (GUAIFENESIN DM PO) Take by mouth. Reported on 08/14/2015  . enalapril (VASOTEC) 10 MG tablet Take 1 tablet (10 mg total) by mouth 2 (two) times daily.  Marland Kitchen lovastatin (MEVACOR) 40 MG tablet Take 2 tablets (80 mg total) by mouth every evening.  . metoprolol succinate (TOPROL-XL) 50 MG 24 hr tablet 1 and 1/2 tab by mouth per day  . SAVAYSA 60 MG TABS tablet TAKE 60 MG BY MOUTH DAILY.  . SYMBICORT 160-4.5 MCG/ACT inhaler INHALE 2 PUFFS INTO THE LUNGS 2 (TWO) TIMES DAILY.  Marland Kitchen temazepam (RESTORIL) 30 MG capsule TAKE ONE CAPSULE AT BEDTIME AS NEEDED FOR SLEEP  . traMADol (ULTRAM-ER) 300 MG 24 hr tablet TAKE 1 TABLET EVERY DAY  . chlorpheniramine-HYDROcodone (TUSSIONEX PENNKINETIC ER) 10-8 MG/5ML SUER Take 5 mLs by mouth 2 (two) times daily. (Patient not taking: Reported on 08/14/2015)   No facility-administered encounter medications on file as of 10/24/2015.     Functional Status:  In your present state of health, do you have any difficulty performing the following  activities: 08/14/2015  Hearing? N  Vision? N  Difficulty concentrating or making decisions? N  Walking or climbing stairs? N  Dressing or bathing? N  Doing errands, shopping? N  Preparing Food and eating ? N  Using the Toilet? N  In the past six months, have you accidently leaked urine? N  Do you have problems with loss of bowel control? N  Managing your Medications? N  Managing your Finances? N  Housekeeping or managing your Housekeeping? N  Some recent data might be hidden    Fall/Depression Screening: PHQ 2/9 Scores 10/24/2015 09/11/2015 08/14/2015 05/08/2015 02/16/2015 07/13/2013 06/04/2012  PHQ - 2 Score 0 0 0 0 0 1 1    Assessment: Patient continues to benefit from health coach outreach for disease management and support.    Plan:  [facility] CM Care Plan Problem One   Flowsheet Row Most Recent Value  Care Plan Problem One  Knowledge deficit Atrial Fibrillation  Role Documenting the Problem One  [place] for Problem One  Active  THN Long Term Goal (31-90 days)  Patient will report knowing signs and symptoms of acute atrial fibrillation and when to notify physician within 90 days.  THN Long Term Goal Start Date  08/14/15  Interventions for Problem One [place] reinforced with patient signs and symptoms of atrial fibrillation.  THN CM Short Term Goal #1 (0-30 days)  Patient will report maintaining diet low salt/low fat diet within 30 days.  THN CM  Short Term Goal #1 Start Date  09/11/15  [facility] CM Short Term Goal #1 Met Date  10/24/15  Interventions for Short Term Goal #1  Patient maintain low sodium diet.  THN CM Short Term Goal #2 (0-30 days)  Patient report maintaining exercise at least 3 days a week within 30 days.  THN CM Short Term Goal #2 Start Date  09/11/15  [facility] CM Short Term Goal #2 Met Date  10/24/15  Interventions for Short Term Goal #2  Patient continues to remain active and exercise.      RN Health Coach will contact patient in the  month of October and patient agrees to next outreach.  [name], RN, MSN [place] [phone]

## 2015-11-06 LAB — CUP PACEART REMOTE DEVICE CHECK: Date Time Interrogation Session: 20170816013801

## 2015-11-08 ENCOUNTER — Ambulatory Visit (INDEPENDENT_AMBULATORY_CARE_PROVIDER_SITE_OTHER): Payer: PPO | Admitting: *Deleted

## 2015-11-08 DIAGNOSIS — I480 Paroxysmal atrial fibrillation: Secondary | ICD-10-CM

## 2015-11-09 NOTE — Progress Notes (Signed)
Carelink Summary Report / Loop Recorder 

## 2015-11-18 ENCOUNTER — Other Ambulatory Visit: Payer: Self-pay | Admitting: Internal Medicine

## 2015-11-20 NOTE — Telephone Encounter (Signed)
Done hardcopy to Corinne  

## 2015-11-20 NOTE — Telephone Encounter (Signed)
faxed

## 2015-11-28 ENCOUNTER — Other Ambulatory Visit: Payer: Self-pay | Admitting: Internal Medicine

## 2015-12-01 LAB — CUP PACEART REMOTE DEVICE CHECK: MDC IDC SESS DTM: 20170915021316

## 2015-12-01 NOTE — Progress Notes (Signed)
Carelink summary report received. Battery status OK. Normal device function. No new symptom episodes, tachy episodes, brady, or pause episodes. No new AF episodes. Monthly summary reports and ROV/PRN 

## 2015-12-07 ENCOUNTER — Telehealth: Payer: Self-pay | Admitting: *Deleted

## 2015-12-07 NOTE — Telephone Encounter (Signed)
Spoke with patient regarding LINQ at RRT.  Patient has decided that he does not want to come in to discuss explant with Dr. Lovena Le at this time.  He currently has a recall in for 05/2016.  Patient removed from [facility], monitor return kit ordered.  Patient is aware it will arrive in 7-10 business days.  He is appreciative of call and denies additional questions or concerns at this time.

## 2015-12-10 ENCOUNTER — Ambulatory Visit: Payer: Self-pay

## 2015-12-13 ENCOUNTER — Other Ambulatory Visit: Payer: Self-pay

## 2015-12-13 NOTE — Patient Outreach (Signed)
[facility]) Care Management  [place]  12/13/2015   Oras Splitt [name] Jan 19, 1956 [id number]  Subjective: Telephone call to patient for assessment call. Patient reports he is doing ok but some shortness of breath at times.  Patient reports he weighs daily and his weight fluctuates.  Reviewed with patient signs of heart failure and notifying physician of weight gain over 5 lbs in one week or other heart failure symptoms. He verbalized understanding. Also discussed signs of atrial fibrillation.  He verbalized understanding.   Objective:   Encounter Medications:  Outpatient Encounter Prescriptions as of 12/13/2015  Medication Sig  . acetaminophen (TYLENOL) 500 MG tablet Take 500 mg by mouth every 6 (six) hours as needed for mild pain.   Marland Kitchen ALPRAZolam (XANAX) 0.5 MG tablet TAKE 1 TABLET BY MOUTH TWICE A DAY AS NEEDED FOR ANXIETY  . aspirin EC 81 MG EC tablet Take 1 tablet (81 mg total) by mouth daily.  Marland Kitchen Dextromethorphan-Guaifenesin (GUAIFENESIN DM PO) Take by mouth. Reported on 08/14/2015  . enalapril (VASOTEC) 10 MG tablet Take 1 tablet (10 mg total) by mouth 2 (two) times daily.  Marland Kitchen lovastatin (MEVACOR) 40 MG tablet TAKE 2 TABLETS (80 MG TOTAL) BY MOUTH EVERY EVENING.  . metoprolol succinate (TOPROL-XL) 50 MG 24 hr tablet 1 and 1/2 tab by mouth per day  . SAVAYSA 60 MG TABS tablet TAKE 60 MG BY MOUTH DAILY.  . SYMBICORT 160-4.5 MCG/ACT inhaler INHALE 2 PUFFS INTO THE LUNGS 2 (TWO) TIMES DAILY.  Marland Kitchen temazepam (RESTORIL) 30 MG capsule TAKE ONE CAPSULE AT BEDTIME AS NEEDED FOR SLEEP  . traMADol (ULTRAM-ER) 300 MG 24 hr tablet TAKE 1 TABLET EVERY DAY  . chlorpheniramine-HYDROcodone (TUSSIONEX PENNKINETIC ER) 10-8 MG/5ML SUER Take 5 mLs by mouth 2 (two) times daily. (Patient not taking: Reported on 12/13/2015)   No facility-administered encounter medications on file as of 12/13/2015.     Functional Status:  In your present state of health, do you have any difficulty  performing the following activities: 08/14/2015  Hearing? N  Vision? N  Difficulty concentrating or making decisions? N  Walking or climbing stairs? N  Dressing or bathing? N  Doing errands, shopping? N  Preparing Food and eating ? N  Using the Toilet? N  In the past six months, have you accidently leaked urine? N  Do you have problems with loss of bowel control? N  Managing your Medications? N  Managing your Finances? N  Housekeeping or managing your Housekeeping? N  Some recent data might be hidden    Fall/Depression Screening: PHQ 2/9 Scores 12/13/2015 10/24/2015 09/11/2015 08/14/2015 05/08/2015 02/16/2015 07/13/2013  PHQ - 2 Score 0 0 0 0 0 0 1    Assessment: Patient continues to benefit from health coach outreach for disease management and support.    Plan:  [facility] CM Care Plan Problem One   Flowsheet Row Most Recent Value  Care Plan Problem One  Knowledge deficit Atrial Fibrillation  Role Documenting the Problem One  [place] for Problem One  Active  THN Long Term Goal (31-90 days)  Patient will report knowing signs and symptoms of acute atrial fibrillation and when to notify physician within 90 days.  THN Long Term Goal Start Date  12/13/15 [goal continued]  Interventions for Problem One Long Term Goal  RN Health Coach reinforced with patient signs and symptoms of atrial fibrillation.  Also reviewed signs of heart failure with patient.       RN  Health Coach will contact patient in the month of December and patient agrees to next outreach.  [name], RN, MSN [place] [phone]

## 2015-12-21 ENCOUNTER — Encounter: Payer: Self-pay | Admitting: Internal Medicine

## 2015-12-21 ENCOUNTER — Ambulatory Visit (INDEPENDENT_AMBULATORY_CARE_PROVIDER_SITE_OTHER): Payer: PPO | Admitting: Internal Medicine

## 2015-12-21 VITALS — BP 130/78 | HR 97 | Resp 20 | Wt 191.2 lb

## 2015-12-21 DIAGNOSIS — Z23 Encounter for immunization: Secondary | ICD-10-CM

## 2015-12-21 DIAGNOSIS — R739 Hyperglycemia, unspecified: Secondary | ICD-10-CM | POA: Diagnosis not present

## 2015-12-21 DIAGNOSIS — J441 Chronic obstructive pulmonary disease with (acute) exacerbation: Secondary | ICD-10-CM

## 2015-12-21 DIAGNOSIS — I10 Essential (primary) hypertension: Secondary | ICD-10-CM

## 2015-12-21 DIAGNOSIS — Z0001 Encounter for general adult medical examination with abnormal findings: Secondary | ICD-10-CM

## 2015-12-21 MED ORDER — ALBUTEROL SULFATE HFA 108 (90 BASE) MCG/ACT IN AERS: 2.0000 | INHALATION_SPRAY | Freq: Four times a day (QID) | RESPIRATORY_TRACT | 11 refills | Status: DC | PRN

## 2015-12-21 MED ORDER — PREDNISONE 10 MG PO TABS: ORAL_TABLET | ORAL | 0 refills | Status: DC

## 2015-12-21 MED ORDER — METHYLPREDNISOLONE ACETATE 80 MG/ML IJ SUSP
80.0000 mg | Freq: Once | INTRAMUSCULAR | Status: AC
  Administered 2015-12-21: 80 mg via INTRAMUSCULAR

## 2015-12-21 NOTE — Progress Notes (Signed)
Subjective:    Patient ID: Scott [name], male    DOB: [birth year], 60 y.o.   MRN: [id number]  HPI    Here with acute onset mild to mod clearish prod cough with wheezeing, sob without fever, HA, sinus pain, ST, chills, and Pt denies chest pain, orthopnea, PND, increased LE swelling, palpitations, dizziness or syncope. Pt denies new neurological symptoms such as new headache, or facial or extremity weakness or numbness   Pt denies polydipsia, polyuria Past Medical History:  Diagnosis Date  . ALLERGIC RHINITIS 01/14/2007  . ANXIETY 09/28/2006  . Asbestos exposure   . Atrial fibrillation ([place]) 09/28/2006   s/p afib ablation x 2  . CHF (congestive heart failure) ([place])   . COPD (chronic obstructive pulmonary disease) ([place])   . Coronary artery disease 11/24/2011  . Diastolic dysfunction 99991111  . Gallstones   . HYPERLIPIDEMIA 09/28/2006  . HYPERTENSION 09/28/2006  . HYPOTHYROIDISM, ACQUIRED NEC 09/28/2006   pt denies and doesn't know  . Long term current use of anticoagulant 04/08/2010  . OBSTRUCTIVE SLEEP APNEA 01/06/2008  . Right knee DJD 07/09/2010  . S/P CABG x 1 11/26/2011   Right internal mammary artery to right coronary artery  . S/P Maze operation for atrial fibrillation 11/26/2011   complete biatrial lesion set with clipping of LA appendage   Past Surgical History:  Procedure Laterality Date  . CORONARY ARTERY BYPASS GRAFT  11/26/2011   Procedure: CORONARY ARTERY BYPASS GRAFTING (CABG);  Surgeon: Rexene Alberts, MD;  Location: [place];  Service: Open Heart Surgery;  Laterality: N/A;  coronary artery bypass on pump times one utilizing the right internal mammary artery, transesophageal echocardiogram   . hand surgury Left   . LEFT HEART CATHETERIZATION WITH CORONARY ANGIOGRAM N/A 11/24/2011   Procedure: LEFT HEART CATHETERIZATION WITH CORONARY ANGIOGRAM;  Surgeon: Peter M Martinique, MD;  Location[facility] C[facility] LAB;  Service: Cardiovascular;  Laterality: N/A;  . LOOP RECORDER IMPLANT N/A 07/06/2012   Procedure: LOOP RECORDER IMPLANT;  Surgeon: Thompson Grayer, MD;  Location: [facility] CATH LAB;  Service: Cardiovascular;  Laterality: N/A;  . MAZE  11/26/2011   Procedure: MAZE;  Surgeon: Rexene Alberts, MD;  Location: [place];  Service: Open Heart Surgery;  Laterality: N/A;  Cryomaze   . Radiofrequency Ablation for atrial fibrillation  12/30/07 and 06/05/09   afib ablation x 2 by JA  . Radiofrequency ablation for atrial flutter  2005   CTI    reports that he quit smoking about 20 years ago. His smoking use included Cigarettes. He has a 39.00 pack-year smoking history. He has never used smokeless tobacco. He reports that he does not drink alcohol or use drugs. family history includes Atrial fibrillation in his mother; Cancer in his paternal grandmother; Dementia in his father and paternal grandfather; Hypertension in his father; Stroke in his mother. Allergies  Allergen Reactions  . Ambien [Zolpidem Tartrate]   . Zolpidem Tartrate Other (See Comments)    Extremely drowsy the next day   Current Outpatient Prescriptions on File Prior to Visit  Medication Sig Dispense Refill  . acetaminophen (TYLENOL) 500 MG tablet Take 500 mg by mouth every 6 (six) hours as needed for mild pain.     Marland Kitchen ALPRAZolam (XANAX) 0.5 MG tablet TAKE 1 TABLET BY MOUTH TWICE A DAY AS NEEDED FOR ANXIETY 180 tablet 1  . aspirin EC 81 MG EC tablet Take 1 tablet (81 mg total) by mouth daily.    . chlorpheniramine-HYDROcodone (TUSSIONEX PENNKINETIC ER) 10-8  MG/5ML SUER Take 5 mLs by mouth 2 (two) times daily. 60 mL 0  . Dextromethorphan-Guaifenesin (GUAIFENESIN DM PO) Take by mouth. Reported on 08/14/2015    . enalapril (VASOTEC) 10 MG tablet Take 1 tablet (10 mg total) by mouth 2 (two) times daily. 180 tablet 3  . lovastatin (MEVACOR) 40 MG tablet TAKE 2 TABLETS (80 MG TOTAL) BY MOUTH EVERY EVENING. 180 tablet 3  . metoprolol succinate (TOPROL-XL) 50 MG 24 hr tablet 1 and 1/2 tab by mouth per day 135 tablet 3  . SAVAYSA 60 MG TABS tablet  TAKE 60 MG BY MOUTH DAILY. 30 tablet 7  . SYMBICORT 160-4.5 MCG/ACT inhaler INHALE 2 PUFFS INTO THE LUNGS 2 (TWO) TIMES DAILY. 30.6 Inhaler 1  . temazepam (RESTORIL) 30 MG capsule TAKE ONE CAPSULE AT BEDTIME AS NEEDED FOR SLEEP 90 capsule 1  . traMADol (ULTRAM-ER) 300 MG 24 hr tablet TAKE 1 TABLET EVERY DAY 30 tablet 5   No current facility-administered medications on file prior to visit.    Review of Systems  Constitutional: Negative for unusual diaphoresis or night sweats HENT: Negative for ear swelling or discharge Eyes: Negative for worsening visual haziness  Respiratory: Negative for choking and stridor.   Gastrointestinal: Negative for distension or worsening eructation Genitourinary: Negative for retention or change in urine volume.  Musculoskeletal: Negative for other MSK pain or swelling Skin: Negative for color change and worsening wound Neurological: Negative for tremors and numbness other than noted  Psychiatric/Behavioral: Negative for decreased concentration or agitation other than above   All other systems neg per pt    Objective:   Physical Exam BP 130/78   Pulse 97   Resp 20   Wt 191 lb 4 oz (86.8 kg)   SpO2 98%   BMI 30.87 kg/m  VS noted, not ill or toxic appearing Constitutional: Pt appears in no apparent distress HENT: Head: NCAT.  Right Ear: External ear normal.  Left Ear: External ear normal.  Eyes: . Pupils are equal, round, and reactive to light. Conjunctivae and EOM are normal Neck: Normal range of motion. Neck supple.  Cardiovascular: Normal rate and regular rhythm.   Pulmonary/Chest: Effort normal and breath sounds decreased  without rales but with mild few wheezing.  Neurological: Pt is alert. Not confused , motor grossly intact Skin: Skin is warm. No rash, no LE edema Psychiatric: Pt behavior is normal. No agitation.     Assessment & Plan:

## 2015-12-21 NOTE — Patient Instructions (Addendum)
OK for the Prevnar 13 pneumonia shot today  You had the steroid shot today  You would be due for he older Pneumovax pneumonia shot in one more year  Please take all new medication as prescribed - the prednisone  Please continue all other medications as before, and refills have been done if requested.  Please have the pharmacy call with any other refills you may need.  Please keep your appointments with your specialists as you may have planned  Please return in 6 months, or sooner if needed, with Lab testing done 3-5 days before

## 2015-12-21 NOTE — Progress Notes (Signed)
Pre visit review using our clinic review tool, if applicable. No additional management support is needed unless otherwise documented below in the visit note. 

## 2015-12-22 DIAGNOSIS — J441 Chronic obstructive pulmonary disease with (acute) exacerbation: Secondary | ICD-10-CM | POA: Insufficient documentation

## 2015-12-22 NOTE — Assessment & Plan Note (Signed)
Asymtp, stable overall by history and exam, and pt to continue medical treatment as before,  to f/u any worsening symptoms or concerns, pt to call for polys or cbg > 200

## 2015-12-22 NOTE — Assessment & Plan Note (Signed)
Mild to mod, for predoac asd after depomerol IM, cont symbicort asd, add proair hfa prn rescue,  to f/u any worsening symptoms or concerns

## 2015-12-22 NOTE — Assessment & Plan Note (Signed)
stable overall by history and exam, recent data reviewed with pt, and pt to continue medical treatment as before,  to f/u any worsening symptoms or concerns BP Readings from Last 3 Encounters:  12/21/15 130/78  06/07/15 (!) 158/84  05/08/15 (!) 146/90

## 2016-01-04 ENCOUNTER — Telehealth: Payer: Self-pay | Admitting: Internal Medicine

## 2016-01-04 ENCOUNTER — Other Ambulatory Visit: Payer: Self-pay | Admitting: Internal Medicine

## 2016-01-04 MED ORDER — BUDESONIDE-FORMOTEROL FUMARATE 160-4.5 MCG/ACT IN AERO: INHALATION_SPRAY | RESPIRATORY_TRACT | 11 refills | Status: DC

## 2016-01-04 NOTE — Telephone Encounter (Signed)
Pt request refill for SYMBICORT 160-4.5 MCG/ACT inhaler send into [facility]. Please advise.

## 2016-01-04 NOTE — Telephone Encounter (Signed)
Done erx 

## 2016-01-11 ENCOUNTER — Encounter: Payer: Self-pay | Admitting: Internal Medicine

## 2016-01-11 ENCOUNTER — Ambulatory Visit (INDEPENDENT_AMBULATORY_CARE_PROVIDER_SITE_OTHER)
Admission: RE | Admit: 2016-01-11 | Discharge: 2016-01-11 | Disposition: A | Discharge status: Home / Self Care | Payer: PPO | Source: Ambulatory Visit | Attending: Internal Medicine | Admitting: Internal Medicine

## 2016-01-11 ENCOUNTER — Ambulatory Visit (INDEPENDENT_AMBULATORY_CARE_PROVIDER_SITE_OTHER): Payer: PPO | Admitting: Internal Medicine

## 2016-01-11 VITALS — BP 144/92 | HR 72 | Temp 98.0°F | Resp 16 | Wt 196.0 lb

## 2016-01-11 DIAGNOSIS — R059 Cough, unspecified: Secondary | ICD-10-CM | POA: Insufficient documentation

## 2016-01-11 DIAGNOSIS — R05 Cough: Secondary | ICD-10-CM | POA: Diagnosis not present

## 2016-01-11 DIAGNOSIS — I10 Essential (primary) hypertension: Secondary | ICD-10-CM

## 2016-01-11 DIAGNOSIS — J441 Chronic obstructive pulmonary disease with (acute) exacerbation: Secondary | ICD-10-CM | POA: Diagnosis not present

## 2016-01-11 DIAGNOSIS — R739 Hyperglycemia, unspecified: Secondary | ICD-10-CM | POA: Diagnosis not present

## 2016-01-11 MED ORDER — ALBUTEROL SULFATE HFA 108 (90 BASE) MCG/ACT IN AERS: 2.0000 | INHALATION_SPRAY | Freq: Four times a day (QID) | RESPIRATORY_TRACT | 11 refills | Status: DC | PRN

## 2016-01-11 MED ORDER — LEVOFLOXACIN 500 MG PO TABS: 500.0000 mg | ORAL_TABLET | Freq: Every day | ORAL | 0 refills | Status: AC

## 2016-01-11 MED ORDER — PREDNISONE 10 MG PO TABS: ORAL_TABLET | ORAL | 0 refills | Status: DC

## 2016-01-11 MED ORDER — HYDROCODONE-HOMATROPINE 5-1.5 MG/5ML PO SYRP: 5.0000 mL | ORAL_SOLUTION | Freq: Four times a day (QID) | ORAL | 0 refills | Status: AC | PRN

## 2016-01-11 MED ORDER — METHYLPREDNISOLONE ACETATE 80 MG/ML IJ SUSP
80.0000 mg | Freq: Once | INTRAMUSCULAR | Status: AC
  Administered 2016-01-11: 80 mg via INTRAMUSCULAR

## 2016-01-11 NOTE — Progress Notes (Signed)
Subjective:    Patient ID: Scott [name], male    DOB: 18-Aug-1955, 60 y.o.   MRN: [id number]  HPI  Here to f/uu with acute; Here with acute onset mild to mod 2-3 days ST, HA, general weakness and malaise, with prod cough greenish sputum and now worsening sob and mild wheezing since yesterday, but Pt denies chest pain, orthopnea, PND, increased LE swelling, palpitations, dizziness or syncope. Pt denies new neurological symptoms such as new headache, or facial or extremity weakness or numbness   Pt denies polydipsia, polyuria, No other new history changes Past Medical History:  Diagnosis Date  . ALLERGIC RHINITIS 01/14/2007  . ANXIETY 09/28/2006  . Asbestos exposure   . Atrial fibrillation ([place]) 09/28/2006   s/p afib ablation x 2  . CHF (congestive heart failure) ([place])   . COPD (chronic obstructive pulmonary disease) ([place])   . Coronary artery disease 11/24/2011  . Diastolic dysfunction 99991111  . Gallstones   . HYPERLIPIDEMIA 09/28/2006  . HYPERTENSION 09/28/2006  . HYPOTHYROIDISM, ACQUIRED NEC 09/28/2006   pt denies and doesn't know  . Long term current use of anticoagulant 04/08/2010  . OBSTRUCTIVE SLEEP APNEA 01/06/2008  . Right knee DJD 07/09/2010  . S/P CABG x 1 11/26/2011   Right internal mammary artery to right coronary artery  . S/P Maze operation for atrial fibrillation 11/26/2011   complete biatrial lesion set with clipping of LA appendage   Past Surgical History:  Procedure Laterality Date  . CORONARY ARTERY BYPASS GRAFT  11/26/2011   Procedure: CORONARY ARTERY BYPASS GRAFTING (CABG);  Surgeon: Rexene Alberts, MD;  Location: [place];  Service: Open Heart Surgery;  Laterality: N/A;  coronary artery bypass on pump times one utilizing the right internal mammary artery, transesophageal echocardiogram   . hand surgury Left   . LEFT HEART CATHETERIZATION WITH CORONARY ANGIOGRAM N/A 11/24/2011   Procedure: LEFT HEART CATHETERIZATION WITH CORONARY ANGIOGRAM;  Surgeon: Peter M Martinique, MD;   Location: [facility] CATH LAB;  Service: Cardiovascular;  Laterality: N/A;  . LOOP RECORDER IMPLANT N/A 07/06/2012   Procedure: LOOP RECORDER IMPLANT;  Surgeon: Thompson Grayer, MD;  Location: [facility] CATH LAB;  Service: Cardiovascular;  Laterality: N/A;  . MAZE  11/26/2011   Procedure: MAZE;  Surgeon: Rexene Alberts, MD;  Location: [place];  Service: Open Heart Surgery;  Laterality: N/A;  Cryomaze   . Radiofrequency Ablation for atrial fibrillation  12/30/07 and 06/05/09   afib ablation x 2 by JA  . Radiofrequency ablation for atrial flutter  2005   CTI    reports that he quit smoking about 20 years ago. His smoking use included Cigarettes. He has a 39.00 pack-year smoking history. He has never used smokeless tobacco. He reports that he does not drink alcohol or use drugs. family history includes Atrial fibrillation in his mother; Cancer in his paternal grandmother; Dementia in his father and paternal grandfather; Hypertension in his father; Stroke in his mother. Allergies  Allergen Reactions  . Ambien [Zolpidem Tartrate]   . Zolpidem Tartrate Other (See Comments)    Extremely drowsy the next day   Current Outpatient Prescriptions on File Prior to Visit  Medication Sig Dispense Refill  . acetaminophen (TYLENOL) 500 MG tablet Take 500 mg by mouth every 6 (six) hours as needed for mild pain.     Marland Kitchen ALPRAZolam (XANAX) 0.5 MG tablet TAKE 1 TABLET BY MOUTH TWICE A DAY AS NEEDED FOR ANXIETY 180 tablet 1  . aspirin EC 81 MG EC tablet  Take 1 tablet (81 mg total) by mouth daily.    . budesonide-formoterol (SYMBICORT) 160-4.5 MCG/ACT inhaler INHALE 2 PUFFS INTO THE LUNGS 2 (TWO) TIMES DAILY. 30.6 Inhaler 11  . chlorpheniramine-HYDROcodone (TUSSIONEX PENNKINETIC ER) 10-8 MG/5ML SUER Take 5 mLs by mouth 2 (two) times daily. 60 mL 0  . Dextromethorphan-Guaifenesin (GUAIFENESIN DM PO) Take by mouth. Reported on 08/14/2015    . enalapril (VASOTEC) 10 MG tablet TAKE 1 TABLET (10 MG TOTAL) BY MOUTH 2 (TWO) TIMES DAILY. 180  tablet 3  . lovastatin (MEVACOR) 40 MG tablet TAKE 2 TABLETS (80 MG TOTAL) BY MOUTH EVERY EVENING. 180 tablet 3  . metoprolol succinate (TOPROL-XL) 50 MG 24 hr tablet 1 and 1/2 tab by mouth per day 135 tablet 3  . SAVAYSA 60 MG TABS tablet TAKE 60 MG BY MOUTH DAILY. 30 tablet 7  . temazepam (RESTORIL) 30 MG capsule TAKE ONE CAPSULE AT BEDTIME AS NEEDED FOR SLEEP 90 capsule 1  . traMADol (ULTRAM-ER) 300 MG 24 hr tablet TAKE 1 TABLET EVERY DAY 30 tablet 5   No current facility-administered medications on file prior to visit.    Review of Systems  Constitutional: Negative for unusual diaphoresis or night sweats HENT: Negative for ear swelling or discharge Eyes: Negative for worsening visual haziness  Respiratory: Negative for choking and stridor.   Gastrointestinal: Negative for distension or worsening eructation Genitourinary: Negative for retention or change in urine volume.  Musculoskeletal: Negative for other MSK pain or swelling Skin: Negative for color change and worsening wound Neurological: Negative for tremors and numbness other than noted  Psychiatric/Behavioral: Negative for decreased concentration or agitation other than above   All otherwise neg per pt    Objective:   Physical Exam BP (!) 144/92   Pulse 72   Temp 98 F (36.7 C) (Oral)   Resp 16   Wt 196 lb (88.9 kg)   SpO2 98%   BMI 31.64 kg/m  VS noted, mild ill appearing Constitutional: Pt appears in no apparent distress HENT: Head: NCAT.  Right Ear: External ear normal.  Left Ear: External ear normal.  Eyes: . Pupils are equal, round, and reactive to light. Conjunctivae and EOM are normal Neck: Normal range of motion. Neck supple.  Cardiovascular: Normal rate and regular rhythm.   Pulmonary/Chest: Effort normal and breath sounds decreased without rales but with bilat scattered wheezing.  Neurological: Pt is alert. Not confused , motor grossly intact Skin: Skin is warm. No rash, no LE edema Psychiatric: Pt  behavior is normal. No agitation.  No other new exam changes    Assessment & Plan:

## 2016-01-11 NOTE — Progress Notes (Signed)
Pre visit review using our clinic review tool, if applicable. No additional management support is needed unless otherwise documented below in the visit note. 

## 2016-01-11 NOTE — Patient Instructions (Addendum)
You had the steroid shot today  Please take all new medication as prescribed - the antibiotic, cough mediciine, prednisone, and inhaler if needed  Please continue all other medications as before, and refills have been done if requested.  Please have the pharmacy call with any other refills you may need.  Please keep your appointments with your specialists as you may have planned  Please go to the XRAY Department in the Basement (go straight as you get off the elevator) for the x-ray testing  You will be contacted by phone if any changes need to be made immediately.  Otherwise, you will receive a letter about your results with an explanation, but please check with MyChart first.  Please remember to sign up for MyChart if you have not done so, as this will be important to you in the future with finding out test results, communicating by private email, and scheduling acute appointments online when needed.

## 2016-01-12 NOTE — Assessment & Plan Note (Signed)
Mild elev today likely reactive, o/w stable overall by history and exam, recent data reviewed with pt, and pt to continue medical treatment as before,  to f/u any worsening symptoms or concerns BP Readings from Last 3 Encounters:  01/11/16 (!) 144/92  12/21/15 130/78  06/07/15 (!) 158/84

## 2016-01-12 NOTE — Assessment & Plan Note (Signed)
Mild to mod, c/w bronchitis vs pna, for cxr, also for antibx course, and cough med prn,  to f/u any worsening symptoms or concerns

## 2016-01-12 NOTE — Assessment & Plan Note (Signed)
Mild to mod, for depomedrol IM 80, predpac asd, inhaler prn,  to f/u any worsening symptoms or concerns 

## 2016-01-12 NOTE — Assessment & Plan Note (Signed)
stable overall by history and exam, recent data reviewed with pt, and pt to continue medical treatment as before,  to f/u any worsening symptoms or concerns Lab Results  Component Value Date   HGBA1C 5.1 11/07/2014   Pt to call for onset polys or cbg > 200 on steroid tx

## 2016-01-14 ENCOUNTER — Other Ambulatory Visit: Payer: Self-pay | Admitting: Internal Medicine

## 2016-01-15 NOTE — Telephone Encounter (Signed)
faxed

## 2016-01-15 NOTE — Telephone Encounter (Signed)
Done hardcopy to Corinne  

## 2016-02-04 ENCOUNTER — Other Ambulatory Visit: Payer: Self-pay

## 2016-02-04 NOTE — Patient Outreach (Signed)
[place] [facility]) Care Management  02/04/2016  Scott [name] 05-13-1955 [id number]   Telephone call to patient for every other month call.  No answer.  HIPAA compliant voice message left.  Plan: RN Health Coach will attempt patient again in the month of December.   [name], RN, MSN [place] [phone]

## 2016-02-15 ENCOUNTER — Other Ambulatory Visit: Payer: Self-pay

## 2016-02-15 NOTE — Patient Outreach (Signed)
[facility]) Care Management  [place]  02/15/2016   Scott [name] March 27, 1955 [id number]  Subjective: Telephone call to patient for assessment. Patient reports he is doing ok.  He reports he tries to remain active by walking his dog and working. Patient states he has had no problems with his heart or atrial fibrillation.  Discussed with patient atrial fibrillation and when to notify physician.  He verbalized understanding.    Objective:   Encounter Medications:  Outpatient Encounter Prescriptions as of 02/15/2016  Medication Sig  . acetaminophen (TYLENOL) 500 MG tablet Take 500 mg by mouth every 6 (six) hours as needed for mild pain.   Marland Kitchen albuterol (PROVENTIL HFA;VENTOLIN HFA) 108 (90 Base) MCG/ACT inhaler Inhale 2 puffs into the lungs every 6 (six) hours as needed for wheezing or shortness of breath.  . ALPRAZolam (XANAX) 0.5 MG tablet TAKE 1 TABLET BY MOUTH TWICE A DAY AS NEEDED FOR ANXIETY  . aspirin EC 81 MG EC tablet Take 1 tablet (81 mg total) by mouth daily.  . budesonide-formoterol (SYMBICORT) 160-4.5 MCG/ACT inhaler INHALE 2 PUFFS INTO THE LUNGS 2 (TWO) TIMES DAILY.  . chlorpheniramine-HYDROcodone (TUSSIONEX PENNKINETIC ER) 10-8 MG/5ML SUER Take 5 mLs by mouth 2 (two) times daily.  Marland Kitchen Dextromethorphan-Guaifenesin (GUAIFENESIN DM PO) Take by mouth. Reported on 08/14/2015  . enalapril (VASOTEC) 10 MG tablet TAKE 1 TABLET (10 MG TOTAL) BY MOUTH 2 (TWO) TIMES DAILY.  Marland Kitchen lovastatin (MEVACOR) 40 MG tablet TAKE 2 TABLETS (80 MG TOTAL) BY MOUTH EVERY EVENING.  . metoprolol succinate (TOPROL-XL) 50 MG 24 hr tablet 1 and 1/2 tab by mouth per day  . SAVAYSA 60 MG TABS tablet TAKE 60 MG BY MOUTH DAILY.  Marland Kitchen temazepam (RESTORIL) 30 MG capsule TAKE ONE CAPSULE AT BEDTIME AS NEEDED FOR SLEEP  . traMADol (ULTRAM-ER) 300 MG 24 hr tablet TAKE 1 TABLET EVERY DAY  . predniSONE (DELTASONE) 10 MG tablet 3 tabs by mouth per day for 3 days,2tabs per day for 3 days,1tab per day for  3 days (Patient not taking: Reported on 02/15/2016)   No facility-administered encounter medications on file as of 02/15/2016.     Functional Status:  In your present state of health, do you have any difficulty performing the following activities: 08/14/2015  Hearing? N  Vision? N  Difficulty concentrating or making decisions? N  Walking or climbing stairs? N  Dressing or bathing? N  Doing errands, shopping? N  Preparing Food and eating ? N  Using the Toilet? N  In the past six months, have you accidently leaked urine? N  Do you have problems with loss of bowel control? N  Managing your Medications? N  Managing your Finances? N  Housekeeping or managing your Housekeeping? N  Some recent data might be hidden    Fall/Depression Screening: PHQ 2/9 Scores 02/15/2016 12/13/2015 10/24/2015 09/11/2015 08/14/2015 05/08/2015 02/16/2015  PHQ - 2 Score 0 0 0 0 0 0 0    Assessment: Patient continues to benefit from health coach outreach for disease management and support.    Plan:  [facility] CM Care Plan Problem One   Flowsheet Row Most Recent Value  Care Plan Problem One  Knowledge deficit Atrial Fibrillation  Role Documenting the Problem One  [place] for Problem One  Active  THN Long Term Goal (31-90 days)  Patient will report knowing signs and symptoms of acute atrial fibrillation and when to notify physician within 90 days.  THN Long Term Goal  Start Date  02/15/16 [goal continued]  Interventions for Problem One Long Term Goal  RN Health Coach reviewed with patient signs and symptoms of atrial fibrillation.  Also reviewed signs of heart failure with patient.      RN Health Coach will contact patient in the month of February and patient agrees to next outreach.  [name], RN, MSN [place] [phone]

## 2016-03-21 ENCOUNTER — Ambulatory Visit (INDEPENDENT_AMBULATORY_CARE_PROVIDER_SITE_OTHER): Payer: PPO | Admitting: *Deleted

## 2016-03-21 VITALS — BP 145/90 | HR 71 | Resp 16 | Ht 66.0 in | Wt 191.5 lb

## 2016-03-21 DIAGNOSIS — Z1211 Encounter for screening for malignant neoplasm of colon: Secondary | ICD-10-CM | POA: Diagnosis not present

## 2016-03-21 DIAGNOSIS — R202 Paresthesia of skin: Secondary | ICD-10-CM

## 2016-03-21 DIAGNOSIS — Z0000 Encounter for general adult medical examination without abnormal findings: Secondary | ICD-10-CM

## 2016-03-21 NOTE — Patient Instructions (Addendum)
  Mr. Joanne Chars , Thank you for taking time to come for your Medicare Wellness Visit. I appreciate your ongoing commitment to your health goals. Please review the following plan we discussed and let me know if I can assist you in the future.   Bring a copy of your advance directives to your next office visit. Schedule a follow-up appointment with Dr. Lovena Le.   These are the goals we discussed: Goals    . Weight (lb) < 155 lb (70.3 kg)       This is a list of the screening recommended for you and due dates:  Health Maintenance  Topic Date Due  .  Hepatitis C: One time screening is recommended by Center for Disease Control  (CDC) for  adults born from [birth year] through 1965.   [birth year]  . HIV Screening  04/05/1970  . Colon Cancer Screening  04/05/2005  . Tetanus Vaccine  04/04/2024  . Flu Shot  Addressed  . Shingles Vaccine  Completed

## 2016-03-21 NOTE — Progress Notes (Addendum)
Subjective:   Scott [name] is a 61 y.o. male who presents for an Initial Medicare Annual Wellness Visit.  Patient c/o numbness from elbow to hand that occurs during activity. It will stops when he rest and will become numb again when he continues activity. Pt. States endorses sob that is unchanged from baseline. This began 7-8 months ago.   Review of Systems  No ROS.  Medicare Wellness Visit.  Cardiac Risk Factors include: advanced age (>32men, >46 women);dyslipidemia;male gender;hypertension  Sleep patterns: has difficulty falling asleep, has restless sleep, does not get up to void and 6-7 hours nightly.   Home Safety/Smoke Alarms: Feels safe in home. Smoke alarms in place. Living environment; residence and Firearm Safety: Lives alone w/ dog. firearms stored safely. Seat Belt Safety/Bike Helmet: Wears seat belt. Wears helmet when riding motorcycle.   Counseling:    Dental- Does not follow w/ dentist.  Male:   CCS- Not on file.     PSA-  Lab Results  Component Value Date   PSA 0.98 05/08/2015   PSA 0.62 04/04/2014   PSA 0.79 12/13/2013      Objective:    Today's Vitals   03/21/16 1442 03/21/16 1443  BP: (!) 145/90   Pulse: 71   Resp: 16   SpO2: 98%   Weight: 191 lb 8 oz (86.9 kg)   Height: 5\' 6"  (1.676 m)   PainSc:  9    Body mass index is 30.91 kg/m.  Current Medications (verified) Outpatient Encounter Prescriptions as of 03/21/2016  Medication Sig  . acetaminophen (TYLENOL) 500 MG tablet Take 1,000 mg by mouth every 6 (six) hours as needed for mild pain.   Marland Kitchen albuterol (PROVENTIL HFA;VENTOLIN HFA) 108 (90 Base) MCG/ACT inhaler Inhale 2 puffs into the lungs every 6 (six) hours as needed for wheezing or shortness of breath.  . ALPRAZolam (XANAX) 0.5 MG tablet TAKE 1 TABLET BY MOUTH TWICE A DAY AS NEEDED FOR ANXIETY  . aspirin EC 81 MG EC tablet Take 1 tablet (81 mg total) by mouth daily.  . budesonide-formoterol (SYMBICORT) 160-4.5 MCG/ACT inhaler INHALE 2 PUFFS  INTO THE LUNGS 2 (TWO) TIMES DAILY.  Marland Kitchen enalapril (VASOTEC) 10 MG tablet TAKE 1 TABLET (10 MG TOTAL) BY MOUTH 2 (TWO) TIMES DAILY.  Marland Kitchen lovastatin (MEVACOR) 40 MG tablet TAKE 2 TABLETS (80 MG TOTAL) BY MOUTH EVERY EVENING.  . metoprolol succinate (TOPROL-XL) 50 MG 24 hr tablet 1 and 1/2 tab by mouth per day  . SAVAYSA 60 MG TABS tablet TAKE 60 MG BY MOUTH DAILY.  Marland Kitchen temazepam (RESTORIL) 30 MG capsule TAKE ONE CAPSULE AT BEDTIME AS NEEDED FOR SLEEP  . traMADol (ULTRAM-ER) 300 MG 24 hr tablet TAKE 1 TABLET EVERY DAY  . [DISCONTINUED] chlorpheniramine-HYDROcodone (TUSSIONEX PENNKINETIC ER) 10-8 MG/5ML SUER Take 5 mLs by mouth 2 (two) times daily. (Patient not taking: Reported on 03/21/2016)  . [DISCONTINUED] Dextromethorphan-Guaifenesin (GUAIFENESIN DM PO) Take by mouth. Reported on 08/14/2015  . [DISCONTINUED] predniSONE (DELTASONE) 10 MG tablet 3 tabs by mouth per day for 3 days,2tabs per day for 3 days,1tab per day for 3 days (Patient not taking: Reported on 02/15/2016)   No facility-administered encounter medications on file as of 03/21/2016.     Allergies (verified) Zolpidem tartrate   History: Past Medical History:  Diagnosis Date  . ALLERGIC RHINITIS 01/14/2007  . ANXIETY 09/28/2006  . Asbestos exposure   . Atrial fibrillation ([place]) 09/28/2006   s/p afib ablation x 2  . CHF (congestive heart failure) (  [place])   . COPD (chronic obstructive pulmonary disease) ([place])   . Coronary artery disease 11/24/2011  . Diastolic dysfunction 99991111  . Gallstones   . HYPERLIPIDEMIA 09/28/2006  . HYPERTENSION 09/28/2006  . HYPOTHYROIDISM, ACQUIRED NEC 09/28/2006   pt denies and doesn't know  . Long term current use of anticoagulant 04/08/2010  . OBSTRUCTIVE SLEEP APNEA 01/06/2008  . Right knee DJD 07/09/2010  . S/P CABG x 1 11/26/2011   Right internal mammary artery to right coronary artery  . S/P Maze operation for atrial fibrillation 11/26/2011   complete biatrial lesion set with clipping of LA appendage    Past Surgical History:  Procedure Laterality Date  . CORONARY ARTERY BYPASS GRAFT  11/26/2011   Procedure: CORONARY ARTERY BYPASS GRAFTING (CABG);  Surgeon: Rexene Alberts, MD;  Location: [facility];  Service: Open Heart Surgery;  Laterality: N/A;  coronary artery bypass on pump times one utilizing the right internal mammary artery, transesophageal echocardiogram   . hand surgury Left   . LEFT HEART CATHETERIZATION WITH CORONARY ANGIOGRAM N/A 11/24/2011   Procedure: LEFT HEART CATHETERIZATION WITH CORONARY ANGIOGRAM;  Surgeon: Peter M Martinique, MD;  [facility] C[facility] LAB;  Service: Cardiovascular;  Laterality: N/A;  . LOOP RECORDER IMPLANT N/A 07/06/2012   Procedure: LOOP RECORDER IMPLANT;  Surgeon: Thompson Grayer, MD;  Location: [facility] CATH LAB;  Service: Cardiovascular;  Laterality: N/A;  . MAZE  11/26/2011   Procedure: MAZE;  Surgeon: Rexene Alberts, MD;  Location: [place];  Service: Open Heart Surgery;  Laterality: N/A;  Cryomaze   . Radiofrequency Ablation for atrial fibrillation  12/30/07 and 06/05/09   afib ablation x 2 by JA  . Radiofrequency ablation for atrial flutter  2005   CTI   Family History  Problem Relation Age of Onset  . Dementia Father   . Hypertension Father   . Atrial fibrillation Mother   . Stroke Mother   . Dementia Paternal Grandfather   . Cancer Paternal Grandmother     type unknown   Social History   Occupational History  . worked previously in the [employer] and feels that he was exposed to asbestos. [employer]   Social History Main Topics  . Smoking status: Former Smoker    Packs/day: 1.50    Years: 26.00    Types: Cigarettes    Quit date: 04/06/1995  . Smokeless tobacco: Never Used  . Alcohol use No  . Drug use: No  . Sexual activity: Yes   Tobacco Counseling Counseling given: Not Answered   Activities of Daily Living In your present state of health, do you have any difficulty performing the following activities: 03/21/2016 08/14/2015   Hearing? N N  Vision? N N  Difficulty concentrating or making decisions? N N  Walking or climbing stairs? N N  Dressing or bathing? N N  Doing errands, shopping? N N  Preparing Food and eating ? N N  Using the Toilet? N N  In the past six months, have you accidently leaked urine? N N  Do you have problems with loss of bowel control? N N  Managing your Medications? N N  Managing your Finances? N N  Housekeeping or managing your Housekeeping? N N  Some recent data might be hidden    Immunizations and Health Maintenance Immunization History  Administered Date(s) Administered  . Influenza Split 11/25/2011  . Influenza Whole 01/13/2005, 11/26/2007, 12/06/2008, 11/29/2009  . Influenza,inj,Quad PF,36+ Mos 03/08/2015  . Influenza-Unspecified 10/25/2012, 12/10/2013, 11/21/2015,  11/25/2015  . Pneumococcal Conjugate-13 12/21/2015  . Pneumococcal Polysaccharide-23 03/11/2011  . Td 03/27/2004, 04/04/2014  . Zoster 01/04/2016   Health Maintenance Due  Topic Date Due  . Hepatitis C Screening  1955-05-26  . HIV Screening  04/05/1970  . COLONOSCOPY  04/05/2005    Patient Care Team: [name], MD as PCP - General Jon Billings, RN as [place] any recent Medical Services you may have received from other than [facility] providers in the past year (date may be approximate).    Assessment:   This is a routine wellness examination for [place]. Physical assessment deferred to PCP.  Hearing/Vision screen Hearing Screening Comments: Able to hear conversational tones w/o difficulty. No issues reported.  Vision Screening Comments: Wearing glasses. No vision issues reported. Follows w/ eye doctor on [place] PR[facility]. Dr. Molli Posey.  Dietary issues and exercise activities discussed: Current Exercise Habits: Home exercise routine, Type of exercise: walking (averages 11,000 steps daily), Frequency (Times/Week): 7, Intensity: Moderate, Exercise limited by:  orthopedic condition(s) (chronic knee pain)  Diet (meal preparation, eat out, water intake, caffeinated beverages, dairy products, fruits and vegetables): in general, a "healthy" diet  , well balanced, on average, 2 meals per day. 4 bottles of water daily. Drinks OJ in the morning. Breakfast: Skips Lunch: Meat and cheese, grapes Dinner: Varies, normally eats at home.   Goals    . Weight (lb) < 155 lb (70.3 kg)      Depression Screen PHQ 2/9 Scores 03/21/2016 02/15/2016 12/13/2015 10/24/2015  PHQ - 2 Score 0 0 0 0    Fall Risk Fall Risk  03/21/2016 02/15/2016 12/13/2015 10/24/2015 09/11/2015  Falls in the past year? Yes No No No No  Number falls in past yr: 2 or more - - - -  Injury with Fall? No - - - -  Risk Factor Category  - - - - -  Risk for fall due to : Impaired balance/gait - - - -    Cognitive Function: MMSE - Mini Mental State Exam 03/21/2016  Orientation to time 4  Orientation to Place 5  Registration 3  Attention/ Calculation 5  Recall 2  Language- name 2 objects 2  Language- repeat 1  Language- follow 3 step command 3  Language- read & follow direction 1  Write a sentence 1  Copy design 1  Total score 28        Screening Tests Health Maintenance  Topic Date Due  . Hepatitis C Screening  Aug 01, 1955  . HIV Screening  04/05/1970  . COLONOSCOPY  04/05/2005  . TETANUS/TDAP  04/04/2024  . INFLUENZA VACCINE  Addressed  . ZOSTAVAX  Completed        Plan:    Left arm numbness: nerve conduction study ordered per Dr. [name]  Schedule follow-up appointment with Dr. Lovena Le  Follow-up with PCP as scheduled.  Bring a copy of your advance directives to your next office visit.  Dental resource list provided  During the course of the visit [place] was educated and counseled about the following appropriate screening and preventive services:   Vaccines to include Pneumoccal, Influenza, Hepatitis B, Td, Zostavax, HCV  Colorectal cancer screening  Cardiovascular  disease screening  Diabetes screening  Glaucoma screening  Nutrition counseling  Prostate cancer screening  Patient Instructions (the written plan) were given to the patient.   Michiel Cowboy, RN   03/21/2016    Medical screening examination/treatment/procedure(s) were performed by non-physician practitioner and as supervising physician  I was immediately available for consultation/collaboration. I agree with above. Cathlean Cower, MD

## 2016-03-25 ENCOUNTER — Other Ambulatory Visit: Payer: Self-pay | Admitting: *Deleted

## 2016-03-25 DIAGNOSIS — R202 Paresthesia of skin: Secondary | ICD-10-CM

## 2016-03-27 ENCOUNTER — Ambulatory Visit (INDEPENDENT_AMBULATORY_CARE_PROVIDER_SITE_OTHER): Payer: PPO | Admitting: Neurology

## 2016-03-27 DIAGNOSIS — R202 Paresthesia of skin: Secondary | ICD-10-CM | POA: Diagnosis not present

## 2016-03-27 DIAGNOSIS — M5412 Radiculopathy, cervical region: Secondary | ICD-10-CM

## 2016-03-27 NOTE — Procedures (Signed)
[facility] Neurology  [address], [address]  [place], [place] [address] Tel: [phone] Fax:  [phone] Test Date:  03/27/2016  Patient: Scott [name] DOB: 07-30-1955 Physician: Narda Amber, DO  Sex: Male Height: 5\' 6"  Ref Phys: Cathlean Cower, M.D.  ID#: [id number] Temp: 34.7C Technician: Jerilynn Mages. Dean   Patient Complaints: This is a 61 year old gentleman referred for evaluation of left hand and forearm numbness.   NCV & EMG Findings: Extensive electrodiagnostic testing of the left upper extremity shows:  1. All sensory responses including the left median, ulnar, radial, mixed palmer, dorsal ulnar cutaneous, medial and lateral antebrachial cutaneous sensory responses are within normal limits.  2. Left median and ulnar nerves are within normal limits.  3. Chronic motor axon loss changes are seen affecting the left first dorsal interosseous, abductor digiti minimi, flexor carpi ulnaris, and extensor indicis proprius muscles. There is no evidence of accompanied active denervation.   Impression: 1. Chronic C8 radiculopathy affecting the left upper extremity; mild-to-moderate in degree electrically. 2. If symptoms progress, repeat electrodiagnostic testing can be considered in 3-6 months to reevaluate the ulnar nerve as there is preferential involvement ulnar-innervated muscles on needle electrode examination, however nerve conduction studies of the ulnar nerve are normal.   ___________________________ Narda Amber, DO    Nerve Conduction Studies Anti Sensory Summary Table   Site NR Peak (ms) Norm Peak (ms) P-T Amp (V) Norm P-T Amp  Left DorsCutan Anti Sensory (Dorsum 5th MC)  34.7C  Wrist    1.6 <3.2 21.2 >5  Left Lat Ante Brach Cutan Anti Sensory (Lat Forearm)  34.7C  Lat Biceps    1.9 <2.8 18.8 >10  Left Med Ante Brach Cutan Anti Sensory (Med Forearm)  34.7C  Elbow    1.9  13.8   Left Median Anti Sensory (2nd Digit)  34.7C  Wrist    3.0 <3.8 21.8 >10  Left Radial Anti  Sensory (Base 1st Digit)  34.7C  Wrist    2.3 <2.8 27.7 >10  Left Ulnar Anti Sensory (5th Digit)  34.7C  Wrist    3.0 <3.2 27.5 >5   Motor Summary Table   Site NR Onset (ms) Norm Onset (ms) O-P Amp (mV) Norm O-P Amp Site1 Site2 Delta-0 (ms) Dist (cm) Vel (m/s) Norm Vel (m/s)  Left Median Motor (Abd Poll Brev)  34.7C  Wrist    3.2 <4.0 6.5 >5 Elbow Wrist 3.8 23.0 61 >50  Elbow    7.0  5.7         Left Ulnar Motor (Abd Dig Minimi)  34.7C  Wrist    2.7 <3.1 9.1 >7 B Elbow Wrist 3.3 17.0 52 >50  B Elbow    6.0  9.0  A Elbow B Elbow 1.7 10.0 59 >50  A Elbow    7.7  8.7          Comparison Summary Table   Site NR Peak (ms) Norm Peak (ms) P-T Amp (V) Site1 Site2 Delta-P (ms) Norm Delta (ms)  Left Median/Ulnar Palm Comparison (Wrist - 8cm)  34.7C  Median Palm    1.9 <2.2 84.6 Median Palm Ulnar Palm 0.1   Ulnar Palm    1.8 <2.2 15.6       EMG   Side Muscle Ins Act Fibs Psw Fasc Number Recrt Dur Dur. Amp Amp. Poly Poly. Comment  Left 1stDorInt Nml Nml Nml Nml 1- Rapid Some 1+ Some 1+ Nml Nml N/A  Left Ext Indicis Nml Nml Nml Nml 1- Rapid  Few 1+ Few 1+ Nml Nml N/A  Left PronatorTeres Nml Nml Nml Nml Nml Nml Nml Nml Nml Nml Nml Nml N/A  Left Biceps Nml Nml Nml Nml Nml Nml Nml Nml Nml Nml Nml Nml N/A  Left Triceps Nml Nml Nml Nml Nml Nml Nml Nml Nml Nml Nml Nml N/A  Left Deltoid Nml Nml Nml Nml Nml Nml Nml Nml Nml Nml Nml Nml N/A  Left ABD Dig Min Nml Nml Nml Nml 3- Rapid Many 1+ Many 1+ Nml Nml N/A  Left FlexCarpiUln Nml Nml Nml Nml 1- Rapid Some 1+ Some 1+ Some 1+ N/A      Waveforms:

## 2016-03-28 ENCOUNTER — Other Ambulatory Visit: Payer: Self-pay | Admitting: Internal Medicine

## 2016-03-28 ENCOUNTER — Encounter: Payer: Self-pay | Admitting: Internal Medicine

## 2016-03-28 DIAGNOSIS — M5412 Radiculopathy, cervical region: Secondary | ICD-10-CM

## 2016-04-02 ENCOUNTER — Other Ambulatory Visit: Payer: Self-pay | Admitting: Internal Medicine

## 2016-04-04 ENCOUNTER — Ambulatory Visit (INDEPENDENT_AMBULATORY_CARE_PROVIDER_SITE_OTHER): Payer: PPO | Admitting: Gastroenterology

## 2016-04-04 ENCOUNTER — Encounter: Payer: Self-pay | Admitting: Gastroenterology

## 2016-04-04 VITALS — BP 150/80 | HR 56 | Ht 66.0 in | Wt 193.6 lb

## 2016-04-04 DIAGNOSIS — Z1211 Encounter for screening for malignant neoplasm of colon: Secondary | ICD-10-CM | POA: Insufficient documentation

## 2016-04-04 DIAGNOSIS — Z7901 Long term (current) use of anticoagulants: Secondary | ICD-10-CM | POA: Diagnosis not present

## 2016-04-04 DIAGNOSIS — I480 Paroxysmal atrial fibrillation: Secondary | ICD-10-CM | POA: Diagnosis not present

## 2016-04-04 NOTE — Progress Notes (Signed)
04/04/2016 Scott [name] [id number] [birth year]   HISTORY OF PRESENT ILLNESS:  This is a 61 year old male who was previously seen here in 2011 to schedule screening colonoscopy but he never proceeded with that study.  Has no GI complaints.  Denies rectal bleeding, bowel changes, etc.  He is on Savaysa for chronic anticoagulation due to atrial fibrillation.  Follows with Dr. Lovena [name] for cardiology.   Past Medical History:  Diagnosis Date  . ALLERGIC RHINITIS 01/14/2007  . ANXIETY 09/28/2006  . Asbestos exposure   . Atrial fibrillation ([place]) 09/28/2006   s/p afib ablation x 2  . CHF (congestive heart failure) ([place])   . COPD (chronic obstructive pulmonary disease) ([place])   . Coronary artery disease 11/24/2011  . Diastolic dysfunction 99991111  . Gallstones   . HYPERLIPIDEMIA 09/28/2006  . HYPERTENSION 09/28/2006  . HYPOTHYROIDISM, ACQUIRED NEC 09/28/2006   pt denies and doesn't know  . Long term current use of anticoagulant 04/08/2010  . OBSTRUCTIVE SLEEP APNEA 01/06/2008  . Right knee DJD 07/09/2010  . S/P CABG x 1 11/26/2011   Right internal mammary artery to right coronary artery  . S/P Maze operation for atrial fibrillation 11/26/2011   complete biatrial lesion set with clipping of LA appendage   Past Surgical History:  Procedure Laterality Date  . CORONARY ARTERY BYPASS GRAFT  11/26/2011   Procedure: CORONARY ARTERY BYPASS GRAFTING (CABG);  Surgeon: Scott Alberts, MD;  Location: [place];  Service: Open Heart Surgery;  Laterality: N/A;  coronary artery bypass on pump times one utilizing the right internal mammary artery, transesophageal echocardiogram   . hand surgury Left   . LEFT HEART CATHETERIZATION WITH CORONARY ANGIOGRAM N/A 11/24/2011   Procedure: LEFT HEART CATHETERIZATION WITH CORONARY ANGIOGRAM;  Surgeon: Scott M Martinique, MD;  [facility] C[facility] LAB;  Service: Cardiovascular;  Laterality: N/A;  . LOOP RECORDER IMPLANT N/A 07/06/2012   Procedure: LOOP RECORDER IMPLANT;  Surgeon:  Scott Grayer, MD;  Location: [facility] CATH LAB;  Service: Cardiovascular;  Laterality: N/A;  . MAZE  11/26/2011   Procedure: MAZE;  Surgeon: Scott Alberts, MD;  Location: [facility];  Service: Open Heart Surgery;  Laterality: N/A;  Cryomaze   . Radiofrequency Ablation for atrial fibrillation  12/30/07 and 06/05/09   afib ablation x 2 by Scott [name]  . Radiofrequency ablation for atrial flutter  2005   CTI    reports that he quit smoking about 21 years ago. His smoking use included Cigarettes. He has a 39.00 pack-year smoking history. He has never used smokeless tobacco. He reports that he does not drink alcohol or use drugs. family history includes Atrial fibrillation in his mother; [name] in his paternal grandmother; Dementia in his father and paternal grandfather; Hypertension in his father; Stroke in his mother. Allergies  Allergen Reactions  . Zolpidem Tartrate Other (See Comments)    Extremely drowsy the next day      Outpatient Encounter Prescriptions as of 04/04/2016  Medication Sig  . acetaminophen (TYLENOL) 500 MG tablet Take 1,000 mg by mouth every 6 (six) hours as needed for mild pain.   Marland Kitchen albuterol (PROVENTIL HFA;VENTOLIN HFA) 108 (90 Base) MCG/ACT inhaler Inhale 2 puffs into the lungs every 6 (six) hours as needed for wheezing or shortness of breath.  . ALPRAZolam (XANAX) 0.5 MG tablet TAKE 1 TABLET BY MOUTH TWICE A DAY AS NEEDED FOR ANXIETY  . aspirin EC 81 MG EC tablet Take 1 tablet (81 mg total) by mouth daily.  Marland Kitchen  budesonide-formoterol (SYMBICORT) 160-4.5 MCG/ACT inhaler INHALE 2 PUFFS INTO THE LUNGS 2 (TWO) TIMES DAILY.  Marland Kitchen enalapril (VASOTEC) 10 MG tablet TAKE 1 TABLET (10 MG TOTAL) BY MOUTH 2 (TWO) TIMES DAILY.  Marland Kitchen lovastatin (MEVACOR) 40 MG tablet TAKE 2 TABLETS (80 MG TOTAL) BY MOUTH EVERY EVENING.  . metoprolol succinate (TOPROL-XL) 50 MG 24 hr tablet 1 and 1/2 tab by mouth per day  . SAVAYSA 60 MG TABS tablet TAKE 60 MG BY MOUTH DAILY.  Marland Kitchen temazepam (RESTORIL) 30 MG capsule TAKE ONE CAPSULE  AT BEDTIME AS NEEDED FOR SLEEP  . traMADol (ULTRAM-ER) 300 MG 24 hr tablet TAKE 1 TABLET EVERY DAY   No facility-administered encounter medications on file as of 04/04/2016.      REVIEW OF SYSTEMS  : All other systems reviewed and negative except where noted in the History of Present Illness.   PHYSICAL EXAM: BP (!) 150/80 (BP Location: Left Arm, Patient Position: Sitting, Cuff Size: Normal)   Pulse (!) 56   Ht 5\' 6"  (1.676 m)   Wt 193 lb 9.6 oz (87.8 kg)   BMI 31.25 kg/m  General: Well developed white male in no acute distress Head: Normocephalic and atraumatic Eyes:  Sclerae anicteric, conjunctiva pink. Ears: Normal auditory acuity Neck: Supple, no masses.  Lungs: Clear throughout to auscultation Heart: Regular rate and rhythm Abdomen: Soft, non-distended.  Normal bowel sounds.  Non-tender. Rectal:  Will be done at the time of colonoscopy. Musculoskeletal: Symmetrical with no gross deformities  Skin: No lesions on visible extremities Extremities: No edema  Neurological: Alert oriented x 4, grossly non-focal Psychological:  Alert and cooperative. Normal mood and affect  ASSESSMENT AND PLAN: -Screening colonoscopy:  Will schedule for colonoscopy with Dr. Henrene [name]. -Chronic anticoagulation:  Will hold Savaysa for 2 days prior to endoscopic procedures - will instruct when and how to resume after procedure. Benefits and risks of procedure explained including risks of bleeding, perforation, infection, missed lesions, reactions to medications and possible need for hospitalization and surgery for complications. Additional rare but real risk of stroke or other vascular clotting events off  also explained and need to seek urgent help if any signs of these problems occur. Will communicate by phone or EMR with patient's  prescribing provider, Dr. Lovena [name], to confirm that holding Scott [name] is reasonable in this case.    CC:  Scott Borg, MD

## 2016-04-04 NOTE — Progress Notes (Signed)
Assessment and plans as outlined noted for anticipated colonoscopy in higher-risk patient

## 2016-04-04 NOTE — Patient Instructions (Addendum)
It has been recommended to you by your physician that you have a(n) colonoscopy completed. Per your request, we did not schedule the procedure(s) today. Please contact our office at [phone] should you decide to have the procedure completed. 

## 2016-04-08 ENCOUNTER — Ambulatory Visit: Payer: Self-pay

## 2016-04-09 ENCOUNTER — Other Ambulatory Visit: Payer: Self-pay

## 2016-04-09 ENCOUNTER — Other Ambulatory Visit: Payer: Self-pay | Admitting: Internal Medicine

## 2016-04-09 NOTE — Patient Outreach (Signed)
[facility]) Care Management  04/09/2016  [place] [name] 01-Jul-1955 [id number]   Telephone call to patient for every other month call.  Patient reports that he cannot talk right now and asked for a call another time.    Plan: RN Health Coach will attempt patient again in the month of February.   [name], RN, MSN [place] [phone]

## 2016-04-15 ENCOUNTER — Other Ambulatory Visit: Payer: Self-pay

## 2016-04-15 NOTE — Patient Outreach (Signed)
[facility]) Care Management  [place]  04/15/2016   Zadiel [place] [name] Jul 18, 1955 [id number]  Subjective: Telephone call to patient for every other month call.  Patient reports he is doing pretty good.  He still works daily at the [employer].  Patient reports some problems with numbness to his left arm.  He states he is scheduled for an MRI on Friday to see what's going on.  Patient denies any problems with his heart beat being irregular.  Discussed with patient signs of Atrial Fibrillation and notifying physician of any episodes.  He verbalized understanding.     Objective:   Encounter Medications:  Outpatient Encounter Prescriptions as of 04/15/2016  Medication Sig  . acetaminophen (TYLENOL) 500 MG tablet Take 1,000 mg by mouth every 6 (six) hours as needed for mild pain.   Marland Kitchen albuterol (PROVENTIL HFA;VENTOLIN HFA) 108 (90 Base) MCG/ACT inhaler Inhale 2 puffs into the lungs every 6 (six) hours as needed for wheezing or shortness of breath.  . ALPRAZolam (XANAX) 0.5 MG tablet TAKE 1 TABLET BY MOUTH TWICE A DAY AS NEEDED FOR ANXIETY  . aspirin EC 81 MG EC tablet Take 1 tablet (81 mg total) by mouth daily.  . budesonide-formoterol (SYMBICORT) 160-4.5 MCG/ACT inhaler INHALE 2 PUFFS INTO THE LUNGS 2 (TWO) TIMES DAILY.  Marland Kitchen enalapril (VASOTEC) 10 MG tablet TAKE 1 TABLET (10 MG TOTAL) BY MOUTH 2 (TWO) TIMES DAILY.  Marland Kitchen lovastatin (MEVACOR) 40 MG tablet TAKE 2 TABLETS (80 MG TOTAL) BY MOUTH EVERY EVENING.  . metoprolol succinate (TOPROL-XL) 50 MG 24 hr tablet Take 1 and 1/2 tab by mouth daily. Yearly physical due in march must see MD for refills  . SAVAYSA 60 MG TABS tablet TAKE 60 MG BY MOUTH DAILY.  Marland Kitchen temazepam (RESTORIL) 30 MG capsule TAKE ONE CAPSULE AT BEDTIME AS NEEDED FOR SLEEP  . traMADol (ULTRAM-ER) 300 MG 24 hr tablet TAKE 1 TABLET EVERY DAY   No facility-administered encounter medications on file as of 04/15/2016.     Functional Status:  In your present state of  health, do you have any difficulty performing the following activities: 03/21/2016 08/14/2015  Hearing? N N  Vision? N N  Difficulty concentrating or making decisions? N N  Walking or climbing stairs? N N  Dressing or bathing? N N  Doing errands, shopping? N N  Preparing Food and eating ? N N  Using the Toilet? N N  In the past six months, have you accidently leaked urine? N N  Do you have problems with loss of bowel control? N N  Managing your Medications? N N  Managing your Finances? N N  Housekeeping or managing your Housekeeping? N N  Some recent data might be hidden    Fall/Depression Screening: PHQ 2/9 Scores 04/15/2016 03/21/2016 02/15/2016 12/13/2015 10/24/2015 09/11/2015 08/14/2015  PHQ - 2 Score 0 0 0 0 0 0 0    Assessment: Patient continues to benefit from health coach outreach for disease management and support.    Plan:  [facility] CM Care Plan Problem One   Flowsheet Row Most Recent Value  Care Plan Problem One  Knowledge deficit Atrial Fibrillation  Role Documenting the Problem One  [place] for Problem One  Active  [facility] Long Term Goal (31-90 days)  Patient will report knowing signs and symptoms of acute atrial fibrillation and when to notify physician within 90 days.  [facility] Long Term Goal Start Date  04/15/16 [goal continued]  Interventions for  Problem One [place] reiterated with patient signs and symptoms of atrial fibrillation.  Also reviewed signs of heart failure with patient.      RN Health Coach will contact patient in the month of April and patient agrees to next outreach.  [name], RN, MSN [place] [phone]

## 2016-04-17 ENCOUNTER — Telehealth: Payer: Self-pay | Admitting: Pharmacist

## 2016-04-17 ENCOUNTER — Other Ambulatory Visit: Payer: Self-pay | Admitting: Internal Medicine

## 2016-04-17 MED ORDER — APIXABAN 5 MG PO TABS: 5.0000 mg | ORAL_TABLET | Freq: Two times a day (BID) | ORAL | 5 refills | Status: DC

## 2016-04-17 NOTE — Telephone Encounter (Signed)
-----   Message from Evans Lance, MD sent at 04/17/2016 11:12 AM EST ----- Regarding: RE: [name] Either is fine with me. GT ----- Message ----- From: Leeroy Bock, RPH Sent: 04/17/2016  10:51 AM To: Evans Lance, MD Subject: Laurell Roof Dr Lovena Le,  We received a refill request for [facility] for [name]. His CrCl is 99 and Savaysa is contraindicated in patients with CrCl > 95 for afib indication (black box warning for increased stroke risk if CrCl >95 due to increased clearance of Savaysa). Can we switch him to either Xarelto or Eliquis? We can take care of the conversion and let the patient know.  Thanks, [employer]

## 2016-04-17 NOTE — Telephone Encounter (Signed)
Spoke with pt and discussed reason for switching from [facility] to a different blood thinner. Gave him the option of Eliquis or Xarelto and he prefers Eliquis. [facility] and [facility] was advised to take his first dose of Eliquis when his next dose of Savaysa would have been due. He is aware it is a BID drug.

## 2016-04-18 ENCOUNTER — Ambulatory Visit
Admission: RE | Admit: 2016-04-18 | Discharge: 2016-04-18 | Disposition: A | Discharge status: Home / Self Care | Payer: PPO | Source: Ambulatory Visit | Attending: Internal Medicine | Admitting: Internal Medicine

## 2016-04-18 ENCOUNTER — Other Ambulatory Visit: Payer: Self-pay | Admitting: Internal Medicine

## 2016-04-18 DIAGNOSIS — Z01818 Encounter for other preprocedural examination: Secondary | ICD-10-CM | POA: Diagnosis not present

## 2016-04-18 DIAGNOSIS — M5412 Radiculopathy, cervical region: Secondary | ICD-10-CM

## 2016-04-18 DIAGNOSIS — Z77018 Contact with and (suspected) exposure to other hazardous metals: Secondary | ICD-10-CM

## 2016-04-18 DIAGNOSIS — M4802 Spinal stenosis, cervical region: Secondary | ICD-10-CM

## 2016-04-18 NOTE — Telephone Encounter (Signed)
Done hardcopy to Anna  

## 2016-04-21 NOTE — Telephone Encounter (Signed)
[facility]

## 2016-05-02 DIAGNOSIS — I10 Essential (primary) hypertension: Secondary | ICD-10-CM | POA: Diagnosis not present

## 2016-05-02 DIAGNOSIS — Z6831 Body mass index (BMI) 31.0-31.9, adult: Secondary | ICD-10-CM | POA: Diagnosis not present

## 2016-05-02 DIAGNOSIS — M4802 Spinal stenosis, cervical region: Secondary | ICD-10-CM | POA: Diagnosis not present

## 2016-05-12 ENCOUNTER — Other Ambulatory Visit: Payer: Self-pay | Admitting: Neurosurgery

## 2016-05-15 ENCOUNTER — Other Ambulatory Visit (INDEPENDENT_AMBULATORY_CARE_PROVIDER_SITE_OTHER): Payer: PPO

## 2016-05-15 ENCOUNTER — Encounter: Payer: Self-pay | Admitting: Internal Medicine

## 2016-05-15 ENCOUNTER — Ambulatory Visit (INDEPENDENT_AMBULATORY_CARE_PROVIDER_SITE_OTHER): Payer: PPO | Admitting: Internal Medicine

## 2016-05-15 VITALS — BP 190/120 | HR 71 | Temp 98.0°F | Ht 65.0 in | Wt 196.0 lb

## 2016-05-15 DIAGNOSIS — I10 Essential (primary) hypertension: Secondary | ICD-10-CM | POA: Diagnosis not present

## 2016-05-15 DIAGNOSIS — R739 Hyperglycemia, unspecified: Secondary | ICD-10-CM

## 2016-05-15 DIAGNOSIS — Z0000 Encounter for general adult medical examination without abnormal findings: Secondary | ICD-10-CM | POA: Diagnosis not present

## 2016-05-15 LAB — CBC WITH DIFFERENTIAL/PLATELET
BASOS ABS: 0.1 10*3/uL (ref 0.0–0.1)
BASOS PCT: 0.7 % (ref 0.0–3.0)
Eosinophils Absolute: 0.4 10*3/uL (ref 0.0–0.7)
Eosinophils Relative: 4.5 % (ref 0.0–5.0)
HCT: 40.2 % (ref 39.0–52.0)
HEMOGLOBIN: 12.8 g/dL — AB (ref 13.0–17.0)
LYMPHS PCT: 18.4 % (ref 12.0–46.0)
Lymphs Abs: 1.7 10*3/uL (ref 0.7–4.0)
MCHC: 31.9 g/dL (ref 30.0–36.0)
MCV: 82.9 fl (ref 78.0–100.0)
MONO ABS: 1 10*3/uL (ref 0.1–1.0)
Monocytes Relative: 10.5 % (ref 3.0–12.0)
Neutro Abs: 6.1 10*3/uL (ref 1.4–7.7)
Neutrophils Relative %: 65.9 % (ref 43.0–77.0)
Platelets: 237 10*3/uL (ref 150.0–400.0)
RBC: 4.85 Mil/uL (ref 4.22–5.81)
RDW: 15.3 % (ref 11.5–15.5)
WBC: 9.2 10*3/uL (ref 4.0–10.5)

## 2016-05-15 LAB — URINALYSIS, ROUTINE W REFLEX MICROSCOPIC
Bilirubin Urine: NEGATIVE
HGB URINE DIPSTICK: NEGATIVE
KETONES UR: NEGATIVE
Leukocytes, UA: NEGATIVE
NITRITE: NEGATIVE
RBC / HPF: NONE SEEN (ref 0–?)
Specific Gravity, Urine: 1.025 (ref 1.000–1.030)
Total Protein, Urine: NEGATIVE
URINE GLUCOSE: NEGATIVE
UROBILINOGEN UA: 0.2 (ref 0.0–1.0)
WBC, UA: NONE SEEN (ref 0–?)
pH: 6 (ref 5.0–8.0)

## 2016-05-15 LAB — HEMOGLOBIN A1C: Hgb A1c MFr Bld: 5.6 % (ref 4.6–6.5)

## 2016-05-15 MED ORDER — METOPROLOL SUCCINATE ER 50 MG PO TB24: ORAL_TABLET | ORAL | 3 refills | Status: DC

## 2016-05-15 MED ORDER — OLMESARTAN-AMLODIPINE-HCTZ 40-5-12.5 MG PO TABS: ORAL_TABLET | ORAL | 3 refills | Status: DC

## 2016-05-15 NOTE — Progress Notes (Signed)
Subjective:    Patient ID: Scott [name], male    DOB: 11-21-1955, 61 y.o.   MRN: [id number]  HPI   Here for wellness and f/u;  Overall doing ok;  Pt denies Chest pain, worsening SOB, DOE, wheezing, orthopnea, PND, worsening LE edema, palpitations, dizziness or syncope.  Pt denies neurological change such as new headache, facial or extremity weakness.  Pt denies polydipsia, polyuria, or low sugar symptoms. Pt states overall good compliance with treatment and medications, good tolerability, and has been trying to follow appropriate diet.  Pt denies worsening depressive symptoms, suicidal ideation or panic. No fever, night sweats, wt loss, loss of appetite, or other constitutional symptoms.  Pt states good ability with ADL's, has low fall risk, home safety reviewed and adequate, no other significant changes in hearing or vision, and only occasionally active with exercise. 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  Has log from jan to date with persistent elev BP to 150 - 170's for most part, and HR usually 60-80 but occas 55.  Needs BP down by about Apr 10 to keep working Past Medical History:  Diagnosis Date  . ALLERGIC RHINITIS 01/14/2007  . ANXIETY 09/28/2006  . Asbestos exposure   . Atrial fibrillation ([place]) 09/28/2006   s/p afib ablation x 2  . CHF (congestive heart failure) ([place])   . COPD (chronic obstructive pulmonary disease) ([place])   . Coronary artery disease 11/24/2011  . Diastolic dysfunction 09/26/5051  . Gallstones   . HYPERLIPIDEMIA 09/28/2006  . HYPERTENSION 09/28/2006  . HYPOTHYROIDISM, ACQUIRED NEC 09/28/2006   pt denies and doesn't know  . Long term current use of anticoagulant 04/08/2010  . OBSTRUCTIVE SLEEP APNEA 01/06/2008  . Right knee DJD 07/09/2010  . S/P CABG x 1 11/26/2011   Right internal  mammary artery to right coronary artery  . S/P Maze operation for atrial fibrillation 11/26/2011   complete biatrial lesion set with clipping of LA appendage   Past Surgical History:  Procedure Laterality Date  . CORONARY ARTERY BYPASS GRAFT  11/26/2011   Procedure: CORONARY ARTERY BYPASS GRAFTING (CABG);  Surgeon: Rexene Alberts, MD;  Location: [place];  Service: Open Heart Surgery;  Laterality: N/A;  coronary artery bypass on pump times one utilizing the right internal mammary artery, transesophageal echocardiogram   . hand surgury Left   . LEFT HEART CATHETERIZATION WITH CORONARY ANGIOGRAM N/A 11/24/2011   Procedure: LEFT HEART CATHETERIZATION WITH CORONARY ANGIOGRAM;  Surgeon: Peter M Martinique, MD;  Location: [facility] CATH LAB;  Service: Cardiovascular;  Laterality: N/A;  . LOOP RECORDER IMPLANT N/A 07/06/2012   Procedure: LOOP RECORDER IMPLANT;  Surgeon: Thompson Grayer, MD;  Location: [facility] CATH LAB;  Service: Cardiovascular;  Laterality: N/A;  . MAZE  11/26/2011   Procedure: MAZE;  Surgeon: Rexene Alberts, MD;  Location: [place];  Service: Open Heart Surgery;  Laterality: N/A;  [place]   . Radiofrequency Ablation for atrial fibrillation  12/30/07 and 06/05/09   afib ablation x 2 by JA  . Radiofrequency ablation for atrial flutter  2005   CTI    reports that he quit smoking about 21 years ago. His smoking use included Cigarettes. He has a 39.00 pack-year smoking history. He has never used smokeless tobacco. He reports that he  does not drink alcohol or use drugs. family history includes Atrial fibrillation in his mother; Cancer in his paternal grandmother; Dementia in his father and paternal grandfather; Hypertension in his father; Stroke in his mother. Allergies  Allergen Reactions  . Zolpidem Tartrate Other (See Comments)    Extremely drowsy the next day   Current Outpatient Prescriptions on File Prior to Visit  Medication Sig Dispense Refill  . acetaminophen (TYLENOL) 500 MG tablet Take 1,000 mg by  mouth every 6 (six) hours as needed for mild pain.     Marland Kitchen albuterol (PROVENTIL HFA;VENTOLIN HFA) 108 (90 Base) MCG/ACT inhaler Inhale 2 puffs into the lungs every 6 (six) hours as needed for wheezing or shortness of breath. 1 Inhaler 11  . ALPRAZolam (XANAX) 0.5 MG tablet TAKE 1 TABLET BY MOUTH TWICE A DAY AS NEEDED FOR ANXIETY 180 tablet 1  . apixaban (ELIQUIS) 5 MG TABS tablet Take 1 tablet (5 mg total) by mouth 2 (two) times daily. 60 tablet 5  . aspirin EC 81 MG EC tablet Take 1 tablet (81 mg total) by mouth daily.    . budesonide-formoterol (SYMBICORT) 160-4.5 MCG/ACT inhaler INHALE 2 PUFFS INTO THE LUNGS 2 (TWO) TIMES DAILY. 30.6 Inhaler 11  . lovastatin (MEVACOR) 40 MG tablet TAKE 2 TABLETS (80 MG TOTAL) BY MOUTH EVERY EVENING. 180 tablet 3  . temazepam (RESTORIL) 30 MG capsule TAKE ONE CAPSULE AT BEDTIME AS NEEDED FOR SLEEP 90 capsule 1  . traMADol (ULTRAM-ER) 300 MG 24 hr tablet TAKE 1 TABLET EVERY DAY 30 tablet 5   No current facility-administered medications on file prior to visit.     Review of Systems Constitutional: Negative for increased diaphoresis, or other activity, appetite or siginficant weight change other than noted HENT: Negative for worsening hearing loss, ear pain, facial swelling, mouth sores and neck stiffness.   Eyes: Negative for other worsening pain, redness or visual disturbance.  Respiratory: Negative for choking or stridor Cardiovascular: Negative for other chest pain and palpitations.  Gastrointestinal: Negative for worsening diarrhea, blood in stool, or abdominal distention Genitourinary: Negative for hematuria, flank pain or change in urine volume.  Musculoskeletal: Negative for myalgias or other joint complaints.  Skin: Negative for other color change and wound or drainage.  Neurological: Negative for syncope and numbness. other than noted Hematological: Negative for adenopathy. or other swelling Psychiatric/Behavioral: Negative for hallucinations, SI,  self-injury, decreased concentration or other worsening agitation.  All other system neg per pt    Objective:   Physical Exam BP (!) 190/120   Pulse 71   Temp 98 F (36.7 C) (Oral)   Ht 5\' 5"  (1.651 m)   Wt 196 lb (88.9 kg)   SpO2 98%   BMI 32.62 kg/m  VS noted,  Constitutional: Pt is oriented to person, place, and time. Appears well-developed and well-nourished, in no significant distress Head: Normocephalic and atraumatic  Eyes: Conjunctivae and EOM are normal. Pupils are equal, round, and reactive to light Right Ear: External ear normal.  Left Ear: External ear normal Nose: Nose normal.  Mouth/Throat: Oropharynx is clear and moist  Neck: Normal range of motion. Neck supple. No JVD present. No tracheal deviation present or significant neck LA or mass Cardiovascular: Normal rate, regular rhythm, normal heart sounds and intact distal pulses.   Pulmonary/Chest: Effort normal and breath sounds without rales or wheezing  Abdominal: Soft. Bowel sounds are normal. NT. No HSM  Musculoskeletal: Normal range of motion. Exhibits no edema Lymphadenopathy: Has no cervical adenopathy.  Neurological:  Pt is alert and oriented to person, place, and time. Pt has normal reflexes. No cranial nerve deficit. Motor grossly intact Skin: Skin is warm and dry. No rash noted or new ulcers Psychiatric:  Has normal mood and affect. Behavior is normal.  No other exam findings    Assessment & Plan:

## 2016-05-15 NOTE — Progress Notes (Signed)
Pre visit review using our clinic review tool, if applicable. No additional management support is needed unless otherwise documented below in the visit note. 

## 2016-05-15 NOTE — Patient Instructions (Signed)
Ok to stop the Enalapril (vasotec)  Please take all new medication as prescribed  - the Olmesartan - amlodipine - HCT (3 medications in 1 pill)  Please continue all other medications as before, including the metoprolol as you have been taking  Please have the pharmacy call with any other refills you may need.  Please continue your efforts at being more active, low cholesterol diet, and weight control.  You are otherwise up to date with prevention measures today.  Please keep your appointments with your specialists as you may have planned  Please go to the LAB in the Basement (turn left off the elevator) for the tests to be done today  You will be contacted by phone if any changes need to be made immediately.  Otherwise, you will receive a letter about your results with an explanation, but please check with MyChart first.  Please remember to sign up for MyChart if you have not done so, as this will be important to you in the future with finding out test results, communicating by private email, and scheduling acute appointments online when needed.  Please return in 1 months, or sooner if needed

## 2016-05-16 LAB — BASIC METABOLIC PANEL
BUN: 21 mg/dL (ref 6–23)
CALCIUM: 9.6 mg/dL (ref 8.4–10.5)
CO2: 28 mEq/L (ref 19–32)
CREATININE: 1.12 mg/dL (ref 0.40–1.50)
Chloride: 105 mEq/L (ref 96–112)
GFR: 70.82 mL/min (ref >60.00)
Glucose, Bld: 100 mg/dL — ABNORMAL HIGH (ref 70–99)
Potassium: 4.4 mEq/L (ref 3.5–5.1)
Sodium: 141 mEq/L (ref 135–145)

## 2016-05-16 LAB — PSA: PSA: 0.72 ng/mL (ref 0.10–4.00)

## 2016-05-16 LAB — LIPID PANEL
CHOL/HDL RATIO: 3
Cholesterol: 148 mg/dL (ref 0–200)
HDL: 47.9 mg/dL (ref >39.00)
LDL CALC: 82 mg/dL (ref 0–99)
NONHDL: 99.61
Triglycerides: 88 mg/dL (ref 0.0–149.0)
VLDL: 17.6 mg/dL (ref 0.0–40.0)

## 2016-05-16 LAB — HEPATIC FUNCTION PANEL
ALT: 29 U/L (ref 0–53)
AST: 31 U/L (ref 0–37)
Albumin: 4.3 g/dL (ref 3.5–5.2)
Alkaline Phosphatase: 62 U/L (ref 39–117)
BILIRUBIN TOTAL: 0.4 mg/dL (ref 0.2–1.2)
Bilirubin, Direct: 0.1 mg/dL (ref 0.0–0.3)
Total Protein: 7 g/dL (ref 6.0–8.3)

## 2016-05-16 LAB — TSH: TSH: 2.54 u[IU]/mL (ref 0.35–4.50)

## 2016-05-17 NOTE — Assessment & Plan Note (Signed)
Severe uncontrolled but seems to tolerate well, volume stable and no arrythmaias or other known end organ dysfxn; to d/c the vasotec, start olmesartan-amlodipine-HCT asd,  to f/u any worsening symptoms or concerns, f/u BP at home and next visit

## 2016-05-17 NOTE — Assessment & Plan Note (Signed)

## 2016-05-17 NOTE — Assessment & Plan Note (Signed)
Minor, for a1c, r/o DM

## 2016-06-07 ENCOUNTER — Other Ambulatory Visit: Payer: Self-pay | Admitting: Internal Medicine

## 2016-06-09 ENCOUNTER — Other Ambulatory Visit: Payer: Self-pay

## 2016-06-09 NOTE — Telephone Encounter (Signed)
Routing to dr john, please advise, thanks 

## 2016-06-09 NOTE — Patient Outreach (Signed)
[name]) Care Management  [place]  06/09/2016   Scott [name] 06/10/1955 [id number]  Subjective: Patient came to the office by mistake for visit.  Patient reports he is doing well.  Patient reports that he will have neck surgery on next week to correct some nerve problems that causes him to have numbness and tingling to his right arm.  Patient reports that he will be inpatient a couple of days.  Explained to patient that he would be called by possibly one of our telephonic nurses for transition of care.  He states he is fine with that.  Patient reports he has had no problem with on going atrial fibrillation.  He reports that sometimes he may feel a little twinge or pause but it stops quickly.  We discussed atrial fibrillation and signs of when to seek help.  He verbalized understanding.   Objective:   Encounter Medications:  Outpatient Encounter Prescriptions as of 06/09/2016  Medication Sig  . acetaminophen (TYLENOL) 500 MG tablet Take 1,000 mg by mouth every 6 (six) hours as needed for mild pain.   Marland Kitchen albuterol (PROVENTIL HFA;VENTOLIN HFA) 108 (90 Base) MCG/ACT inhaler Inhale 2 puffs into the lungs every 6 (six) hours as needed for wheezing or shortness of breath.  . ALPRAZolam (XANAX) 0.5 MG tablet TAKE 1 TABLET BY MOUTH TWICE A DAY AS NEEDED FOR ANXIETY  . apixaban (ELIQUIS) 5 MG TABS tablet Take 1 tablet (5 mg total) by mouth 2 (two) times daily.  Marland Kitchen aspirin EC 81 MG EC tablet Take 1 tablet (81 mg total) by mouth daily.  . budesonide-formoterol (SYMBICORT) 160-4.5 MCG/ACT inhaler INHALE 2 PUFFS INTO THE LUNGS 2 (TWO) TIMES DAILY.  Marland Kitchen lovastatin (MEVACOR) 40 MG tablet TAKE 2 TABLETS (80 MG TOTAL) BY MOUTH EVERY EVENING.  . metoprolol succinate (TOPROL-XL) 50 MG 24 hr tablet Take 1 and 1/2 tab by mouth daily. Yearly physical due in march must see MD for refills  . Olmesartan-Amlodipine-HCTZ 40-5-12.5 MG TABS 1 by mouth every day  . temazepam (RESTORIL) 30 MG  capsule TAKE ONE CAPSULE AT BEDTIME AS NEEDED FOR SLEEP  . traMADol (ULTRAM-ER) 300 MG 24 hr tablet TAKE 1 TABLET EVERY DAY   No facility-administered encounter medications on file as of 06/09/2016.     Functional Status:  In your present state of health, do you have any difficulty performing the following activities: 03/21/2016 08/14/2015  Hearing? N N  Vision? N N  Difficulty concentrating or making decisions? N N  Walking or climbing stairs? N N  Dressing or bathing? N N  Doing errands, shopping? N N  Preparing Food and eating ? N N  Using the Toilet? N N  In the past six months, have you accidently leaked urine? N N  Do you have problems with loss of bowel control? N N  Managing your Medications? N N  Managing your Finances? N N  Housekeeping or managing your Housekeeping? N N  Some recent data might be hidden    Fall/Depression Screening: PHQ 2/9 Scores 05/15/2016 04/15/2016 03/21/2016 02/15/2016 12/13/2015 10/24/2015 09/11/2015  PHQ - 2 Score 0 0 0 0 0 0 0    Assessment: Patient continues to benefit from health coach outreach for disease management and support.    Plan:  [facility] CM Care Plan Problem One     Most Recent Value  Care Plan Problem One  Knowledge deficit Atrial Fibrillation  Role Documenting the Problem One  [place]  for Problem One  Active  [facility] Long Term Goal (31-90 days)  Patient will report knowing signs and symptoms of acute atrial fibrillation and when to notify physician within 90 days.  [facility] Long Term Goal Start Date  06/09/16 [goal continued]  Interventions for Problem One Long Term Goal  RN Health Coach discussed with patient signs and symptoms of atrial fibrillation.  Also reviewed signs of heart failure with patient.      RN Health Coach will contact patient in the month of June and patient agrees to next outreach.  [name], RN, MSN [place] [phone]

## 2016-06-10 ENCOUNTER — Ambulatory Visit: Payer: Self-pay

## 2016-06-10 NOTE — Telephone Encounter (Signed)
faxed

## 2016-06-10 NOTE — Telephone Encounter (Signed)
Done hardcopy to Shir[place]  

## 2016-06-12 NOTE — Pre-Procedure Instructions (Signed)
Spence Soberano [name]  06/12/2016      [facility] - [place] [address] [place] [address] Phone: [phone] Fax: [phone]  [id number] [place], [place] [address] [place] [address] Phone: [phone] Fax: [phone]    Your procedure is scheduled on Friday, April 27.  Report to [facility] Admitting at 6:00 AM                Your surgery or procedure is scheduled for 8:00 AM.   Call this number if you have problems the morning of surgery:[phone]               For any other questions, please call [phone], Monday - Friday 8 AM - 4 PM.    Remember:  Do not eat food or drink liquids after midnight: Thursday, April 26.  Take these medicines the morning of surgery with A SIP OF WATER: metoprolol succinate (TOPROL-XL). Use Albuterol Inhalerif needed and bring it with you.  May take Tylenol if needed.             Hold apixaban (ELIQUIS) as instructed by your surgeon.              1 Week prior to surgery STOP taking Aspirin, Aspirin Products (Goody Powder, Excedrin Migraine), Ibuprofen (Advil), Naproxen (Aleve), Viatiams and Herbal Products (ie Fish Oil) Special instructions:   [facility]- Preparing For Surgery  Before surgery, you can play an important role. Because skin is not sterile, your skin needs to be as free of germs as possible. You can reduce the number of germs on your skin by washing with CHG (chlorahexidine gluconate) Soap before surgery.  CHG is an antiseptic cleaner which kills germs and bonds with the skin to continue killing germs even after washing.  Please do not use if you have an allergy to CHG or antibacterial soaps. If your skin becomes reddened/irritated stop using the CHG.  Do not shave (including legs and underarms) for at least 48 hours prior to first CHG shower. It is OK to shave your face.  Please follow  these instructions carefully.   1. Shower the NIGHT BEFORE SURGERY and the MORNING OF SURGERY with CHG.   2. If you chose to wash your hair, wash your hair first as usual with your normal shampoo.  3. After you shampoo, rinse your hair and body thoroughly to remove the shampoo.  4. Use CHG as you would any other liquid soap. You can apply CHG directly to the skin and wash gently with a scrungie or a clean washcloth.   5. Apply the CHG Soap to your body ONLY FROM THE NECK DOWN.  Do not use on open wounds or open sores. Avoid contact with your eyes, ears, mouth and genitals (private parts). Wash genitals (private parts) with your normal soap.  6. Wash thoroughly, paying special attention to the area where your surgery will be performed.  7. Thoroughly rinse your body with warm water from the neck down.  8. DO NOT shower/wash with your normal soap after using and rinsing off the CHG Soap.  9. Pat yourself dry with a CLEAN TOWEL.   10. Wear CLEAN PAJAMAS   11. Place CLEAN SHEETS on your bed the night of your first shower and DO NOT SLEEP WITH PETS Day of Surgery: Do not apply any deodorants/lotionspowders, or perfumes, .  Please wear clean clothes to the hospital/surgery center.    Do not wear jewelry, make-up or nail polish.  Do not shave 48 hours prior to surgery.  Men may shave face and neck.  Do not bring valuables to the hospital.  [facility] is not responsible for any belongings or valuables.  Contacts, dentures or bridgework may not be worn into surgery.  Leave your suitcase in the car.  After surgery it may be brought to your room.  For patients admitted to the hospital, discharge time will be determined by your treatment team.  Please read over the following fact sheets that you were given:   Patient Instructions for Mupirocin Application, Coughing and Deep Breathing , Pain Booklet, Surgical Site Infections

## 2016-06-13 ENCOUNTER — Telehealth: Payer: Self-pay | Admitting: Internal Medicine

## 2016-06-13 ENCOUNTER — Encounter (HOSPITAL_COMMUNITY)
Admission: RE | Admit: 2016-06-13 | Discharge: 2016-06-13 | Disposition: A | Discharge status: Home / Self Care | Payer: PPO | Source: Ambulatory Visit | Attending: Neurosurgery | Admitting: Neurosurgery

## 2016-06-13 ENCOUNTER — Encounter (HOSPITAL_COMMUNITY): Payer: Self-pay

## 2016-06-13 DIAGNOSIS — I10 Essential (primary) hypertension: Secondary | ICD-10-CM | POA: Insufficient documentation

## 2016-06-13 DIAGNOSIS — Z01812 Encounter for preprocedural laboratory examination: Secondary | ICD-10-CM | POA: Insufficient documentation

## 2016-06-13 DIAGNOSIS — Z01818 Encounter for other preprocedural examination: Secondary | ICD-10-CM | POA: Diagnosis not present

## 2016-06-13 DIAGNOSIS — Z01810 Encounter for preprocedural cardiovascular examination: Secondary | ICD-10-CM | POA: Insufficient documentation

## 2016-06-13 HISTORY — DX: Cardiac arrhythmia, unspecified: I49.9

## 2016-06-13 HISTORY — DX: Dyspnea, unspecified: R06.00

## 2016-06-13 LAB — CBC WITH DIFFERENTIAL/PLATELET
Basophils Absolute: 0 10*3/uL (ref 0.0–0.1)
Basophils Relative: 0 %
Eosinophils Absolute: 0.3 10*3/uL (ref 0.0–0.7)
Eosinophils Relative: 4 %
HEMATOCRIT: 39.3 % (ref 39.0–52.0)
HEMOGLOBIN: 12.4 g/dL — AB (ref 13.0–17.0)
LYMPHS ABS: 1 10*3/uL (ref 0.7–4.0)
Lymphocytes Relative: 14 %
MCH: 27.1 pg (ref 26.0–34.0)
MCHC: 31.6 g/dL (ref 30.0–36.0)
MCV: 86 fL (ref 78.0–100.0)
MONOS PCT: 10 %
Monocytes Absolute: 0.7 10*3/uL (ref 0.1–1.0)
NEUTROS ABS: 5.1 10*3/uL (ref 1.7–7.7)
NEUTROS PCT: 72 %
Platelets: 178 10*3/uL (ref 150–400)
RBC: 4.57 MIL/uL (ref 4.22–5.81)
RDW: 15.4 % (ref 11.5–15.5)
WBC: 7 10*3/uL (ref 4.0–10.5)

## 2016-06-13 LAB — BASIC METABOLIC PANEL
ANION GAP: 8 (ref 5–15)
BUN: 20 mg/dL (ref 6–20)
CHLORIDE: 108 mmol/L (ref 101–111)
CO2: 25 mmol/L (ref 22–32)
CREATININE: 1.12 mg/dL (ref 0.61–1.24)
Calcium: 9.2 mg/dL (ref 8.9–10.3)
GFR calc non Af Amer: >60 mL/min (ref >60)
GLUCOSE: 112 mg/dL — AB (ref 65–99)
Potassium: 4.1 mmol/L (ref 3.5–5.1)
Sodium: 141 mmol/L (ref 135–145)

## 2016-06-13 LAB — SURGICAL PCR SCREEN
MRSA, PCR: NEGATIVE
Staphylococcus aureus: NEGATIVE

## 2016-06-13 NOTE — Progress Notes (Signed)
CALLED [facility] X2 TO GET INSTRUCTION RE: WHEN PATIENT COULD STOP ELIQUIS + ASPIRIN FOR SURGERY.  [facility] OFFICE STATED THEY WOULD CALL PATIENT .  PATIENT INSTRUCTED TO CALL TODAY IF NOT HEARD ANYTHING.

## 2016-06-13 NOTE — Telephone Encounter (Signed)
Ok to hold plavix 5 days prior to surgury, and asa x 7 days prior

## 2016-06-13 NOTE — Telephone Encounter (Signed)
Pt called in and has some questions about his med before his surgery , can you please call him

## 2016-06-13 NOTE — Telephone Encounter (Addendum)
Pt will be having surgery 4/27, need to know if he can stop his blood thinner Eliquis and aspirin and when to do so.

## 2016-06-13 NOTE — Telephone Encounter (Signed)
Patient has been informed.

## 2016-06-13 NOTE — Telephone Encounter (Signed)
[facility] called back.. They needed to know ASAP. Sonia Baller - [phone] or can contact pt at [phone].

## 2016-06-16 NOTE — Progress Notes (Addendum)
Anesthesia Chart Review:  Pt is a 61 year old male scheduled for C5-6, C6-7, C7-T1 ACDF on 06/20/2016 with Earnie Larsson, M.D.  - PCP is Cathlean Cower, MD - EP cardiologist is Cristopher Peru, MD, last office visit 06/07/15.   PMH includes: CAD (s/p 1 vessel CABG 11/26/11 (RIMA to RCA) , atrial fibrillation (none documented since MAZE procedure 11/26/11), CHF, HTN, hyperlipidemia, OSA, COPD, hypothyroidism. Pt has implanted loop recorder that is no longer being monitored (inserted 07/06/12). Former smoker. BMI 32.   Medications include: Albuterol, Eliquis, ASA 81 mg, Symbicort, lovastatin, metoprolol, olmesartan-amlodipine-HCTZ.   Preoperative labs reviewed.    CXR 01/11/16: Stable changes of COPD.  No acute abnormality.  EKG 06/13/16: Sinus bradycardia (53 bpm).   Cardiopulmonary exercise test 10/21/12:  - Exercise testing with gas exchange demonstrates a mild functional limitation when compared to matched sedentary norms. At peak exercise, thepatient is clearly limited by his ventilation. There was no hypoxia noted. The ventilatory defect is out of proportion to his very mild COPD on spirometry and raises thequestion respiratory muscle weakness. Would repeat PFTs with measurement of MIP/MEP and also consider a sniff test under fluoroscopy to evaluate diaphragmatic motion and exclude diaphragmatic paralysis after recent chest surgery.  - Dr. Danton Sewer with pulmonology comments on results "I would recommend that he work on a consistent exercise program and see if his breathing improves over time. I do not see any pulmonary testing that can be done to give [place] more information."  Cardiac cath 11/24/11:  1. Single vessel obstructive coronary disease. (RCA 80-90%; LAD 20-30%, CX <10%, LM 20-30%).  2. Normal left ventricular function. 3. No significant aortoiliac disease.  Reviewed case with Dr. Lissa Hoard.  Pt will need to be seen by cardiology for clearance prior to surgery.   Willeen Cass, FNP-BC [facility]  Short Stay Surgical Center/Anesthesiology Phone: [phone] 06/16/2016 5:08 PM  Addendum:   Dr. Lovena Le has cleared pt for surgery based on last office visit 06/07/15.  Pt to hold eliquis 3 days prior to surgery per Dr. Lovena Le.   If no changes, I anticipate pt can proceed with surgery as scheduled.   Willeen Cass, FNP-BC [facility] Short Stay Surgical Center/Anesthesiology Phone: [phone] 06/17/2016 1:23 PM

## 2016-06-17 ENCOUNTER — Telehealth: Payer: Self-pay | Admitting: Internal Medicine

## 2016-06-17 NOTE — Telephone Encounter (Signed)
Please advise hold on Eliquis and ASA.

## 2016-06-17 NOTE — Telephone Encounter (Signed)
Pt takes Eliquis for afib with CHADS2 score of 2 (HTN and CHF). Ok to hold Eliquis for 3 days prior to spinal procedure per protocol.  Called Lorriane Shire with [facility] (extension [phone]) and she stated they do not need aspirin clearance, they need someone to look at the patient's cath from September 2013. We do not have record of this in Epic. Apparently pt went for his pre op appt and this cath showed severe single vessel CAD with 80-90% blockage. Anesthesia will not perform the patient's procedure until the MD addresses this cath in particular. Pt also has not been seen in a year. They will fax over particular cath that they need Dr Lovena Le to look at. Advised Lorriane Shire that I will call her once I have his response.

## 2016-06-17 NOTE — Telephone Encounter (Signed)
Spoke with Dr Lovena Le who reviewed patient. He underwent CABG in October 2013 as treatment for his single vessel CAD. May proceed with procedure, low risk for surgery per Dr Lovena Le. Updated clearance faxed back to [facility] [phone] and Lorriane Shire with Dr Annette Stable has been updated.

## 2016-06-17 NOTE — Telephone Encounter (Signed)
New message     They received your surgical clearance , but anesthesia wants the CAD address and when should pt hold the Eliquis and how long, needs this addressed today

## 2016-06-18 ENCOUNTER — Ambulatory Visit: Payer: PPO | Admitting: Internal Medicine

## 2016-06-18 ENCOUNTER — Telehealth: Payer: Self-pay | Admitting: Internal Medicine

## 2016-06-18 NOTE — Telephone Encounter (Signed)
Patient made aware that the pharmacist Spoke with Dr Lovena Le who reviewed patient. He underwent CABG in October 2013 as treatment for his single vessel CAD. May proceed with procedure, low risk for surgery per Dr Lovena Le. Updated clearance faxed back to [facility] [phone] and Lorriane Shire with Dr Annette Stable has been updated. (see phone note from 4/24). Patient verbalized understanding.

## 2016-06-18 NOTE — Telephone Encounter (Signed)
New message    Pt is calling to find out if Dr. Lovena Le has spoke with the anesthesiologist that's doing his procedure with Dr. Trenton Gammon.

## 2016-06-20 ENCOUNTER — Inpatient Hospital Stay (HOSPITAL_COMMUNITY)
Admission: RE | Disposition: A | Discharge status: Home / Self Care | Payer: Self-pay | Source: Ambulatory Visit | Attending: Neurosurgery

## 2016-06-20 ENCOUNTER — Encounter (HOSPITAL_COMMUNITY): Payer: Self-pay | Admitting: Anesthesiology

## 2016-06-20 ENCOUNTER — Inpatient Hospital Stay (HOSPITAL_COMMUNITY): Payer: PPO | Admitting: Emergency Medicine

## 2016-06-20 ENCOUNTER — Inpatient Hospital Stay (HOSPITAL_COMMUNITY): Payer: PPO | Admitting: Anesthesiology

## 2016-06-20 ENCOUNTER — Ambulatory Visit: Payer: PPO | Admitting: Internal Medicine

## 2016-06-20 ENCOUNTER — Inpatient Hospital Stay (HOSPITAL_COMMUNITY): Payer: PPO

## 2016-06-20 ENCOUNTER — Inpatient Hospital Stay (HOSPITAL_COMMUNITY)
Admission: RE | Admit: 2016-06-20 | Discharge: 2016-06-21 | DRG: 472 | Disposition: A | Discharge status: Home / Self Care | Payer: PPO | Source: Ambulatory Visit | Attending: Neurosurgery | Admitting: Neurosurgery

## 2016-06-20 DIAGNOSIS — Z7901 Long term (current) use of anticoagulants: Secondary | ICD-10-CM

## 2016-06-20 DIAGNOSIS — I4891 Unspecified atrial fibrillation: Secondary | ICD-10-CM | POA: Diagnosis not present

## 2016-06-20 DIAGNOSIS — I10 Essential (primary) hypertension: Secondary | ICD-10-CM | POA: Diagnosis not present

## 2016-06-20 DIAGNOSIS — M50022 Cervical disc disorder at C5-C6 level with myelopathy: Secondary | ICD-10-CM | POA: Diagnosis not present

## 2016-06-20 DIAGNOSIS — E039 Hypothyroidism, unspecified: Secondary | ICD-10-CM | POA: Diagnosis not present

## 2016-06-20 DIAGNOSIS — I2510 Atherosclerotic heart disease of native coronary artery without angina pectoris: Secondary | ICD-10-CM | POA: Diagnosis present

## 2016-06-20 DIAGNOSIS — I509 Heart failure, unspecified: Secondary | ICD-10-CM | POA: Diagnosis not present

## 2016-06-20 DIAGNOSIS — M4802 Spinal stenosis, cervical region: Secondary | ICD-10-CM | POA: Diagnosis not present

## 2016-06-20 DIAGNOSIS — Z87891 Personal history of nicotine dependence: Secondary | ICD-10-CM

## 2016-06-20 DIAGNOSIS — X58XXXA Exposure to other specified factors, initial encounter: Secondary | ICD-10-CM | POA: Diagnosis not present

## 2016-06-20 DIAGNOSIS — I5032 Chronic diastolic (congestive) heart failure: Secondary | ICD-10-CM | POA: Diagnosis not present

## 2016-06-20 DIAGNOSIS — Z981 Arthrodesis status: Secondary | ICD-10-CM | POA: Diagnosis not present

## 2016-06-20 DIAGNOSIS — M4712 Other spondylosis with myelopathy, cervical region: Secondary | ICD-10-CM | POA: Diagnosis present

## 2016-06-20 DIAGNOSIS — Z6830 Body mass index (BMI) 30.0-30.9, adult: Secondary | ICD-10-CM

## 2016-06-20 DIAGNOSIS — Z419 Encounter for procedure for purposes other than remedying health state, unspecified: Secondary | ICD-10-CM

## 2016-06-20 DIAGNOSIS — Z7982 Long term (current) use of aspirin: Secondary | ICD-10-CM | POA: Diagnosis not present

## 2016-06-20 DIAGNOSIS — F419 Anxiety disorder, unspecified: Secondary | ICD-10-CM | POA: Diagnosis present

## 2016-06-20 DIAGNOSIS — E785 Hyperlipidemia, unspecified: Secondary | ICD-10-CM | POA: Diagnosis not present

## 2016-06-20 DIAGNOSIS — G959 Disease of spinal cord, unspecified: Secondary | ICD-10-CM | POA: Diagnosis not present

## 2016-06-20 DIAGNOSIS — G4730 Sleep apnea, unspecified: Secondary | ICD-10-CM | POA: Diagnosis present

## 2016-06-20 DIAGNOSIS — J449 Chronic obstructive pulmonary disease, unspecified: Secondary | ICD-10-CM | POA: Diagnosis present

## 2016-06-20 DIAGNOSIS — E669 Obesity, unspecified: Secondary | ICD-10-CM | POA: Diagnosis present

## 2016-06-20 DIAGNOSIS — G992 Myelopathy in diseases classified elsewhere: Secondary | ICD-10-CM | POA: Diagnosis present

## 2016-06-20 DIAGNOSIS — R269 Unspecified abnormalities of gait and mobility: Secondary | ICD-10-CM | POA: Diagnosis not present

## 2016-06-20 DIAGNOSIS — I110 Hypertensive heart disease with heart failure: Secondary | ICD-10-CM | POA: Diagnosis not present

## 2016-06-20 DIAGNOSIS — G4733 Obstructive sleep apnea (adult) (pediatric): Secondary | ICD-10-CM | POA: Diagnosis not present

## 2016-06-20 DIAGNOSIS — S0502XA Injury of conjunctiva and corneal abrasion without foreign body, left eye, initial encounter: Secondary | ICD-10-CM | POA: Diagnosis not present

## 2016-06-20 DIAGNOSIS — M542 Cervicalgia: Secondary | ICD-10-CM | POA: Diagnosis not present

## 2016-06-20 DIAGNOSIS — Z951 Presence of aortocoronary bypass graft: Secondary | ICD-10-CM | POA: Diagnosis not present

## 2016-06-20 HISTORY — PX: ANTERIOR CERVICAL DECOMP/DISCECTOMY FUSION: SHX1161

## 2016-06-20 HISTORY — PX: CERVICAL FUSION: SHX112

## 2016-06-20 LAB — TYPE AND SCREEN
ABO/RH(D): B NEG
Antibody Screen: NEGATIVE

## 2016-06-20 LAB — PROTIME-INR
INR: 0.98
Prothrombin Time: 13 seconds (ref 11.4–15.2)

## 2016-06-20 SURGERY — ANTERIOR CERVICAL DECOMPRESSION/DISCECTOMY FUSION 3 LEVELS
Anesthesia: General

## 2016-06-20 MED ORDER — MIDAZOLAM HCL 2 MG/2ML IJ SOLN
INTRAMUSCULAR | Status: AC
  Filled 2016-06-20: qty 2

## 2016-06-20 MED ORDER — CYCLOBENZAPRINE HCL 10 MG PO TABS
ORAL_TABLET | ORAL | Status: AC
  Filled 2016-06-20: qty 1

## 2016-06-20 MED ORDER — ADULT MULTIVITAMIN W/MINERALS CH
1.0000 | ORAL_TABLET | Freq: Every day | ORAL | Status: DC
  Administered 2016-06-20 – 2016-06-21 (×2): 1 via ORAL
  Filled 2016-06-20 (×2): qty 1

## 2016-06-20 MED ORDER — MEPERIDINE HCL 25 MG/ML IJ SOLN: 6.2500 mg | INTRAMUSCULAR | Status: DC | PRN

## 2016-06-20 MED ORDER — TRAMADOL HCL ER 300 MG PO TB24: 300.0000 mg | ORAL_TABLET | Freq: Every day | ORAL | Status: DC

## 2016-06-20 MED ORDER — HYDROMORPHONE HCL 1 MG/ML IJ SOLN
INTRAMUSCULAR | Status: AC
  Filled 2016-06-20: qty 0.5

## 2016-06-20 MED ORDER — PROMETHAZINE HCL 25 MG/ML IJ SOLN
INTRAMUSCULAR | Status: AC
  Administered 2016-06-20: 6.25 mg via INTRAVENOUS
  Filled 2016-06-20: qty 1

## 2016-06-20 MED ORDER — HYDROMORPHONE HCL 1 MG/ML IJ SOLN
INTRAMUSCULAR | Status: AC
  Administered 2016-06-20: 0.5 mg via INTRAVENOUS
  Filled 2016-06-20: qty 0.5

## 2016-06-20 MED ORDER — DOCUSATE SODIUM 100 MG PO CAPS: 100.0000 mg | ORAL_CAPSULE | Freq: Every day | ORAL | Status: DC | PRN

## 2016-06-20 MED ORDER — ROCURONIUM BROMIDE 100 MG/10ML IV SOLN
INTRAVENOUS | Status: DC | PRN
  Administered 2016-06-20: 50 mg via INTRAVENOUS
  Administered 2016-06-20 (×2): 10 mg via INTRAVENOUS

## 2016-06-20 MED ORDER — SODIUM CHLORIDE 0.9% FLUSH
3.0000 mL | Freq: Two times a day (BID) | INTRAVENOUS | Status: DC
  Administered 2016-06-21: 3 mL via INTRAVENOUS

## 2016-06-20 MED ORDER — PROPOFOL 10 MG/ML IV BOLUS
INTRAVENOUS | Status: DC | PRN
  Administered 2016-06-20: 170 mg via INTRAVENOUS

## 2016-06-20 MED ORDER — OLMESARTAN-AMLODIPINE-HCTZ 40-5-12.5 MG PO TABS: 1.0000 | ORAL_TABLET | Freq: Every day | ORAL | Status: DC

## 2016-06-20 MED ORDER — ARTIFICIAL TEARS OPHTHALMIC OINT
TOPICAL_OINTMENT | OPHTHALMIC | Status: DC | PRN
  Filled 2016-06-20: qty 3.5

## 2016-06-20 MED ORDER — ALBUTEROL SULFATE (2.5 MG/3ML) 0.083% IN NEBU: 3.0000 mL | INHALATION_SOLUTION | Freq: Four times a day (QID) | RESPIRATORY_TRACT | Status: DC | PRN

## 2016-06-20 MED ORDER — ONDANSETRON HCL 4 MG PO TABS: 4.0000 mg | ORAL_TABLET | Freq: Four times a day (QID) | ORAL | Status: DC | PRN

## 2016-06-20 MED ORDER — THROMBIN 5000 UNITS EX SOLR
CUTANEOUS | Status: AC
  Filled 2016-06-20: qty 5000

## 2016-06-20 MED ORDER — HYDROCODONE-ACETAMINOPHEN 5-325 MG PO TABS
ORAL_TABLET | ORAL | Status: AC
  Filled 2016-06-20: qty 2

## 2016-06-20 MED ORDER — CEFAZOLIN SODIUM-DEXTROSE 1-4 GM/50ML-% IV SOLN
1.0000 g | Freq: Three times a day (TID) | INTRAVENOUS | Status: AC
  Administered 2016-06-20 – 2016-06-21 (×2): 1 g via INTRAVENOUS
  Filled 2016-06-20 (×2): qty 50

## 2016-06-20 MED ORDER — PRAVASTATIN SODIUM 40 MG PO TABS
40.0000 mg | ORAL_TABLET | Freq: Every day | ORAL | Status: DC
  Administered 2016-06-20: 40 mg via ORAL
  Filled 2016-06-20: qty 1

## 2016-06-20 MED ORDER — TEMAZEPAM 15 MG PO CAPS
30.0000 mg | ORAL_CAPSULE | Freq: Every day | ORAL | Status: DC
  Administered 2016-06-20: 30 mg via ORAL
  Filled 2016-06-20: qty 2

## 2016-06-20 MED ORDER — FENTANYL CITRATE (PF) 250 MCG/5ML IJ SOLN
INTRAMUSCULAR | Status: AC
  Filled 2016-06-20: qty 5

## 2016-06-20 MED ORDER — THROMBIN 5000 UNITS EX SOLR
OROMUCOSAL | Status: DC | PRN
  Administered 2016-06-20 (×2): via TOPICAL

## 2016-06-20 MED ORDER — LIDOCAINE HCL (CARDIAC) 20 MG/ML IV SOLN
INTRAVENOUS | Status: DC | PRN
  Administered 2016-06-20: 100 mg via INTRATRACHEAL

## 2016-06-20 MED ORDER — HYDROMORPHONE HCL 1 MG/ML IJ SOLN
INTRAMUSCULAR | Status: AC
  Administered 2016-06-20: 0.5 mg via INTRAVENOUS
  Filled 2016-06-20: qty 1

## 2016-06-20 MED ORDER — ONDANSETRON HCL 4 MG/2ML IJ SOLN: 4.0000 mg | Freq: Four times a day (QID) | INTRAMUSCULAR | Status: DC | PRN

## 2016-06-20 MED ORDER — METOPROLOL SUCCINATE ER 50 MG PO TB24
75.0000 mg | ORAL_TABLET | Freq: Every day | ORAL | Status: DC
  Administered 2016-06-21: 09:00:00 75 mg via ORAL
  Filled 2016-06-20: qty 1

## 2016-06-20 MED ORDER — SODIUM CHLORIDE 0.9 % IV SOLN
250.0000 mL | INTRAVENOUS | Status: DC
Start: 2016-06-20 — End: 2016-06-21

## 2016-06-20 MED ORDER — HYDROCODONE-ACETAMINOPHEN 5-325 MG PO TABS
1.0000 | ORAL_TABLET | ORAL | Status: DC | PRN
  Administered 2016-06-20 – 2016-06-21 (×4): 2 via ORAL
  Administered 2016-06-21: 1 via ORAL
  Administered 2016-06-21 (×2): 2 via ORAL
  Filled 2016-06-20 (×6): qty 2

## 2016-06-20 MED ORDER — CYCLOBENZAPRINE HCL 10 MG PO TABS
10.0000 mg | ORAL_TABLET | Freq: Three times a day (TID) | ORAL | Status: DC | PRN
  Administered 2016-06-20 – 2016-06-21 (×4): 10 mg via ORAL
  Filled 2016-06-20 (×4): qty 1

## 2016-06-20 MED ORDER — IRBESARTAN 300 MG PO TABS
300.0000 mg | ORAL_TABLET | Freq: Every day | ORAL | Status: DC
  Administered 2016-06-21: 300 mg via ORAL
  Filled 2016-06-20: qty 1

## 2016-06-20 MED ORDER — 0.9 % SODIUM CHLORIDE (POUR BTL) OPTIME
TOPICAL | Status: DC | PRN
  Administered 2016-06-20: 1000 mL

## 2016-06-20 MED ORDER — MOMETASONE FURO-FORMOTEROL FUM 200-5 MCG/ACT IN AERO
2.0000 | INHALATION_SPRAY | Freq: Two times a day (BID) | RESPIRATORY_TRACT | Status: DC
Start: 2016-06-20 — End: 2016-06-21
  Filled 2016-06-20: qty 8.8

## 2016-06-20 MED ORDER — LACTATED RINGERS IV SOLN
INTRAVENOUS | Status: DC | PRN
  Administered 2016-06-20 (×2): via INTRAVENOUS

## 2016-06-20 MED ORDER — OXYCODONE HCL 5 MG PO TABS: 5.0000 mg | ORAL_TABLET | Freq: Once | ORAL | Status: DC | PRN

## 2016-06-20 MED ORDER — DEXAMETHASONE SODIUM PHOSPHATE 10 MG/ML IJ SOLN
10.0000 mg | INTRAMUSCULAR | Status: AC
  Administered 2016-06-20: 10 mg via INTRAVENOUS
  Filled 2016-06-20: qty 1

## 2016-06-20 MED ORDER — HYDROCHLOROTHIAZIDE 12.5 MG PO CAPS
12.5000 mg | ORAL_CAPSULE | Freq: Every day | ORAL | Status: DC
  Administered 2016-06-21: 12.5 mg via ORAL
  Filled 2016-06-20: qty 1

## 2016-06-20 MED ORDER — MENTHOL 3 MG MT LOZG
1.0000 | LOZENGE | OROMUCOSAL | Status: DC | PRN
Start: 2016-06-20 — End: 2016-06-21

## 2016-06-20 MED ORDER — ONDANSETRON HCL 4 MG/2ML IJ SOLN
INTRAMUSCULAR | Status: DC | PRN
  Administered 2016-06-20: 4 mg via INTRAVENOUS

## 2016-06-20 MED ORDER — THROMBIN 20000 UNITS EX KIT
PACK | CUTANEOUS | Status: DC | PRN
  Administered 2016-06-20: 09:00:00 via TOPICAL

## 2016-06-20 MED ORDER — CEFAZOLIN SODIUM-DEXTROSE 2-4 GM/100ML-% IV SOLN
2.0000 g | INTRAVENOUS | Status: AC
  Administered 2016-06-20: 2 g via INTRAVENOUS
  Filled 2016-06-20: qty 100

## 2016-06-20 MED ORDER — PROMETHAZINE HCL 25 MG/ML IJ SOLN
6.2500 mg | INTRAMUSCULAR | Status: DC | PRN
  Administered 2016-06-20: 6.25 mg via INTRAVENOUS

## 2016-06-20 MED ORDER — LACTATED RINGERS IV SOLN
INTRAVENOUS | Status: DC
  Administered 2016-06-20: 50 mL/h via INTRAVENOUS

## 2016-06-20 MED ORDER — SUGAMMADEX SODIUM 200 MG/2ML IV SOLN
INTRAVENOUS | Status: DC | PRN
  Administered 2016-06-20: 200 mg via INTRAVENOUS

## 2016-06-20 MED ORDER — AMLODIPINE BESYLATE 5 MG PO TABS
5.0000 mg | ORAL_TABLET | Freq: Every day | ORAL | Status: DC
  Administered 2016-06-21: 5 mg via ORAL
  Filled 2016-06-20: qty 1

## 2016-06-20 MED ORDER — CHLORHEXIDINE GLUCONATE CLOTH 2 % EX PADS: 6.0000 | MEDICATED_PAD | Freq: Once | CUTANEOUS | Status: DC

## 2016-06-20 MED ORDER — PROPOFOL 10 MG/ML IV BOLUS
INTRAVENOUS | Status: AC
  Filled 2016-06-20: qty 40

## 2016-06-20 MED ORDER — TRAMADOL HCL 50 MG PO TABS
100.0000 mg | ORAL_TABLET | Freq: Three times a day (TID) | ORAL | Status: DC
  Administered 2016-06-21: 100 mg via ORAL
  Filled 2016-06-20: qty 2

## 2016-06-20 MED ORDER — HYDROMORPHONE HCL 1 MG/ML IJ SOLN
0.2500 mg | INTRAMUSCULAR | Status: DC | PRN
  Administered 2016-06-20 (×4): 0.5 mg via INTRAVENOUS

## 2016-06-20 MED ORDER — SODIUM CHLORIDE 0.9 % IR SOLN
Status: DC | PRN
  Administered 2016-06-20: 09:00:00

## 2016-06-20 MED ORDER — PHENOL 1.4 % MT LIQD: 1.0000 | OROMUCOSAL | Status: DC | PRN

## 2016-06-20 MED ORDER — FENTANYL CITRATE (PF) 250 MCG/5ML IJ SOLN
INTRAMUSCULAR | Status: DC | PRN
  Administered 2016-06-20 (×6): 50 ug via INTRAVENOUS
  Administered 2016-06-20: 100 ug via INTRAVENOUS
  Administered 2016-06-20: 50 ug via INTRAVENOUS

## 2016-06-20 MED ORDER — THROMBIN 20000 UNITS EX SOLR
CUTANEOUS | Status: AC
  Filled 2016-06-20: qty 20000

## 2016-06-20 MED ORDER — MIDAZOLAM HCL 5 MG/5ML IJ SOLN
INTRAMUSCULAR | Status: DC | PRN
  Administered 2016-06-20: 2 mg via INTRAVENOUS

## 2016-06-20 MED ORDER — ARTIFICIAL TEARS OPHTHALMIC OINT
TOPICAL_OINTMENT | Freq: Once | OPHTHALMIC | Status: AC
  Administered 2016-06-20: 19:00:00 via OPHTHALMIC
  Filled 2016-06-20: qty 3.5

## 2016-06-20 MED ORDER — ALPRAZOLAM 0.5 MG PO TABS
0.5000 mg | ORAL_TABLET | Freq: Two times a day (BID) | ORAL | Status: DC
  Administered 2016-06-20 – 2016-06-21 (×2): 0.5 mg via ORAL
  Filled 2016-06-20 (×2): qty 1

## 2016-06-20 MED ORDER — OXYCODONE HCL 5 MG/5ML PO SOLN: 5.0000 mg | Freq: Once | ORAL | Status: DC | PRN

## 2016-06-20 MED ORDER — SODIUM CHLORIDE 0.9% FLUSH: 3.0000 mL | INTRAVENOUS | Status: DC | PRN

## 2016-06-20 MED ORDER — VITAMIN D3 25 MCG (1000 UNIT) PO TABS
1000.0000 [IU] | ORAL_TABLET | Freq: Every day | ORAL | Status: DC
  Administered 2016-06-20 – 2016-06-21 (×2): 1000 [IU] via ORAL
  Filled 2016-06-20 (×4): qty 1

## 2016-06-20 MED ORDER — HYDROMORPHONE HCL 1 MG/ML IJ SOLN
0.5000 mg | INTRAMUSCULAR | Status: DC | PRN
  Administered 2016-06-20 – 2016-06-21 (×2): 1 mg via INTRAVENOUS
  Filled 2016-06-20 (×2): qty 1

## 2016-06-20 SURGICAL SUPPLY — 62 items
APL SKNCLS STERI-STRIP NONHPOA (GAUZE/BANDAGES/DRESSINGS) ×1
BAG DECANTER FOR FLEXI CONT (MISCELLANEOUS) ×3 IMPLANT
BENZOIN TINCTURE PRP APPL 2/3 (GAUZE/BANDAGES/DRESSINGS) ×3 IMPLANT
BIT DRILL 13 (BIT) ×1 IMPLANT
BIT DRILL 13MM (BIT) ×1
BUR MATCHSTICK NEURO 3.0 LAGG (BURR) ×5 IMPLANT
CAGE PEEK 6X14X11 (Cage) ×6 IMPLANT
CAGE PEEK 9X14X11 (Cage) ×2 IMPLANT
CAGE SPNL 11X14X6XRADOPQ (Cage) IMPLANT
CANISTER SUCT 3000ML PPV (MISCELLANEOUS) ×3 IMPLANT
CARTRIDGE OIL MAESTRO DRILL (MISCELLANEOUS) ×1 IMPLANT
CLOSURE WOUND 1/2 X4 (GAUZE/BANDAGES/DRESSINGS) ×1
DIFFUSER DRILL AIR PNEUMATIC (MISCELLANEOUS) ×3 IMPLANT
DRAIN JACKSON PRATT 10MM FLAT (MISCELLANEOUS) ×2 IMPLANT
DRAPE C-ARM 42X72 X-RAY (DRAPES) ×6 IMPLANT
DRAPE LAPAROTOMY 100X72 PEDS (DRAPES) ×3 IMPLANT
DRAPE MICROSCOPE LEICA (MISCELLANEOUS) ×3 IMPLANT
DRAPE POUCH INSTRU U-SHP 10X18 (DRAPES) ×3 IMPLANT
DURAPREP 6ML APPLICATOR 50/CS (WOUND CARE) ×3 IMPLANT
ELECT COATED BLADE 2.86 ST (ELECTRODE) ×3 IMPLANT
ELECT REM PT RETURN 9FT ADLT (ELECTROSURGICAL) ×3
ELECTRODE REM PT RTRN 9FT ADLT (ELECTROSURGICAL) ×1 IMPLANT
EVACUATOR SILICONE 100CC (DRAIN) ×2 IMPLANT
GAUZE SPONGE 4X4 12PLY STRL (GAUZE/BANDAGES/DRESSINGS) ×3 IMPLANT
GAUZE SPONGE 4X4 16PLY XRAY LF (GAUZE/BANDAGES/DRESSINGS) ×2 IMPLANT
GLOVE ECLIPSE 9.0 STRL (GLOVE) ×3 IMPLANT
GLOVE EXAM NITRILE LRG STRL (GLOVE) IMPLANT
GLOVE EXAM NITRILE XL STR (GLOVE) IMPLANT
GLOVE EXAM NITRILE XS STR PU (GLOVE) IMPLANT
GOWN STRL REUS W/ TWL LRG LVL3 (GOWN DISPOSABLE) IMPLANT
GOWN STRL REUS W/ TWL XL LVL3 (GOWN DISPOSABLE) IMPLANT
GOWN STRL REUS W/TWL 2XL LVL3 (GOWN DISPOSABLE) ×4 IMPLANT
GOWN STRL REUS W/TWL LRG LVL3 (GOWN DISPOSABLE) ×3
GOWN STRL REUS W/TWL XL LVL3 (GOWN DISPOSABLE) ×3
HALTER HD/CHIN CERV TRACTION D (MISCELLANEOUS) ×3 IMPLANT
HEMOSTAT POWDER KIT SURGIFOAM (HEMOSTASIS) ×4 IMPLANT
KIT BASIN OR (CUSTOM PROCEDURE TRAY) ×3 IMPLANT
KIT ROOM TURNOVER OR (KITS) ×3 IMPLANT
NDL SPNL 20GX3.5 QUINCKE YW (NEEDLE) ×1 IMPLANT
NEEDLE SPNL 20GX3.5 QUINCKE YW (NEEDLE) ×3 IMPLANT
NS IRRIG 1000ML POUR BTL (IV SOLUTION) ×3 IMPLANT
OIL CARTRIDGE MAESTRO DRILL (MISCELLANEOUS) ×3
PACK LAMINECTOMY NEURO (CUSTOM PROCEDURE TRAY) ×3 IMPLANT
PAD ARMBOARD 7.5X6 YLW CONV (MISCELLANEOUS) ×9 IMPLANT
PLATE 3 60XNS SPNE CVD ANT T (Plate) IMPLANT
PLATE 3 ATLANTIS TRANS (Plate) ×3 IMPLANT
RUBBERBAND STERILE (MISCELLANEOUS) ×6 IMPLANT
SCREW ST FIX 4 ATL 3120213 (Screw) ×16 IMPLANT
SPACER SPNL 11X14X6XPEEK CVD (Cage) IMPLANT
SPCR SPNL 11X14X6XPEEK CVD (Cage) ×1 IMPLANT
SPONGE INTESTINAL PEANUT (DISPOSABLE) ×3 IMPLANT
SPONGE SURGIFOAM ABS GEL 100 (HEMOSTASIS) ×3 IMPLANT
STRIP CLOSURE SKIN 1/2X4 (GAUZE/BANDAGES/DRESSINGS) ×2 IMPLANT
SUT BONE WAX W31G (SUTURE) ×2 IMPLANT
SUT VIC AB 3-0 SH 8-18 (SUTURE) ×3 IMPLANT
SUT VIC AB 4-0 RB1 18 (SUTURE) ×5 IMPLANT
TAPE CLOTH 4X10 WHT NS (GAUZE/BANDAGES/DRESSINGS) ×3 IMPLANT
TAPE CLOTH SURG 4X10 WHT LF (GAUZE/BANDAGES/DRESSINGS) ×2 IMPLANT
TOWEL GREEN STERILE (TOWEL DISPOSABLE) ×2 IMPLANT
TOWEL GREEN STERILE FF (TOWEL DISPOSABLE) ×3 IMPLANT
TRAP SPECIMEN MUCOUS 40CC (MISCELLANEOUS) ×3 IMPLANT
WATER STERILE IRR 1000ML POUR (IV SOLUTION) ×3 IMPLANT

## 2016-06-20 NOTE — Op Note (Signed)
Date of procedure: 06/20/2016 Date of dictation: Same  Service: Neurosurgery  Preoperative diagnosis: Cervical stenosis with myelopathy  Postoperative diagnosis: Same  Procedure Name: C5-6, C6-7, C7-T1 anterior cervical discectomy with interbody fusion utilizing interbody peek cages, locally harvested autograft, and anterior plate instrumentation  Surgeon:Manraj Yeo A.Meghann Landing, M.D.  Asst. Surgeon: Ditty  Anesthesia: General  Indication: 61 year old male with neck and bilateral upper extremity pain with progressive numbness and weakness left greater than right extending into his upper extremities. Workup demonstrates evidence of marked multilevel spondylosis with severe stenosis with spinal cord signal change. Patient presents now for three-level anterior cervical decompression and fusion in hopes of improving his symptoms.  Operative note: After induction of anesthesia, patient position supine with neck slightly extended and held in place of halter traction. Patient's anterior cervical region prepped and draped sterilely. Incision made overlying C5-6-7. Dissection performed on the left. Retractor placed. Fluoroscopy used. Levels confirmed. Discectomies then performed at C5-6, C6-7 and C7-T1. Anterior osteophytes were removed. Disc material was removed with various instruments and the high-speed drill. All disc was removed down to level posterior annulus at all 3 levels. Microscope was brought field used throughout the remainder of the discectomy. Remaining aspects of annulus and osteophytes removed using high-speed drill down to level the posterior longitudinal limb. Posterior longitudinal limb is an elevated and resected in piecemeal fashion. Underlying thecal sac was identified. A wide central decompression was then performed by undercutting the bodies of C5 and C6. Decompression then proceeded into each neural foramen. Anterior foraminotomies were performed on course exiting C6 nerve roots bilaterally.  At this point a very thorough decompression been achieved. There was no evidence of injury to thecal sac or nerve roots. Wound is then irrigated MI solution. Procedure then repeated at C6-7 and C7-T1. At C6-7 there was a small right paracentral dural laceration which intermittently leaked CSF but stopped leaking spontaneously. Wound is then irrigated out like solution. Medtronic anatomic peek cages were packed with locally harvested autograft. Cages were impacted at all 3 levels and recessed slightly from the anterior cortical margin. Medtronic Atlantis translational plate was then placed over the C5-T1 levels. This an attachment or fluoroscopic guidance using 13 mm fixed angle screws to reach it all 4 levels. All 8 screws given a final tightening found to be solidly within the bone. Locking screws and gauge at all levels. Final images revealed good position of the cages and the instrumentation at the proper upper level with normal lamina spine. Wounds and irrigated one final time. Hemostasis was achieved with bipolar chart. A Jackson-Pratt drain was left in the prevertebral space. Wounds and close in layers with Vicryl sutures. Steri-Strips and sterile dressing were applied. No apparent complications. Patient tolerated the procedure well and he returns to the recovery room postop.

## 2016-06-20 NOTE — Anesthesia Postprocedure Evaluation (Signed)
Anesthesia Post Note  Patient: Scott [name]  Procedure(s) Performed: Procedure(s) (LRB): Anterior Cervical Discectomy Fusion - Cervical five-Cervical six - Cervical six-Cervical seven - Cervicla seven- Thoracic one (N/A)  Patient location during evaluation: PACU Anesthesia Type: General Level of consciousness: awake and alert Pain management: pain level controlled Vital Signs Assessment: post-procedure vital signs reviewed and stable Respiratory status: spontaneous breathing, nonlabored ventilation and respiratory function stable Cardiovascular status: blood pressure returned to baseline and stable Postop Assessment: no signs of nausea or vomiting Anesthetic complications: no       Last Vitals:  Vitals:   06/20/16 0637  BP: (!) 143/79  Pulse: (!) 50  Resp: 20  Temp: 36.6 C    Last Pain:  Vitals:   06/20/16 0729  TempSrc:   PainSc: 0-No pain                 Lynda Rainwater

## 2016-06-20 NOTE — H&P (Signed)
Scott [name] is an 61 y.o. male.   Chief Complaint: Neck pain and progressive weakness HPI: 60 year old male with significant coronary artery disease is admitted for treatment and evaluation of severe cervical stenosis. Patient has a history of progressive neck pain with radiation to both upper extremities left greater than right. He has progressive sensory loss and weakness in both distal upper extremities. He is having difficulty with gait and ambulation. He is not having any bowel or bladder dysfunction. Workup demonstrates evidence of cervical stenosis with severe cord compression at C5-6, C6-7 and C7-T1. Patient presents now for decompression and fusion in hopes of improving his situation.  Past Medical History:  Diagnosis Date  . ALLERGIC RHINITIS 01/14/2007  . ANXIETY 09/28/2006  . Asbestos exposure   . Atrial fibrillation ([place]) 09/28/2006   s/p afib ablation x 2  . CHF (congestive heart failure) ([place])   . COPD (chronic obstructive pulmonary disease) ([place])   . Coronary artery disease 11/24/2011  . Diastolic dysfunction 04/04/5186  . Dyspnea    W/ EXERTION   . Dysrhythmia    AFIB     . Gallstones   . HYPERLIPIDEMIA 09/28/2006  . HYPERTENSION 09/28/2006  . HYPOTHYROIDISM, ACQUIRED NEC 09/28/2006   pt denies and doesn't know  . Long term current use of anticoagulant 04/08/2010  . OBSTRUCTIVE SLEEP APNEA 01/06/2008   NOT USING    . Right knee DJD 07/09/2010  . S/P CABG x 1 11/26/2011   Right internal mammary artery to right coronary artery  . S/P Maze operation for atrial fibrillation 11/26/2011   complete biatrial lesion set with clipping of LA appendage    Past Surgical History:  Procedure Laterality Date  . CARDIOVERSION     X4   . CORONARY ARTERY BYPASS GRAFT  11/26/2011   Procedure: CORONARY ARTERY BYPASS GRAFTING (CABG);  Surgeon: Rexene Alberts, MD;  Location: [facility];  Service: Open Heart Surgery;  Laterality: N/A;  coronary artery bypass on pump times one utilizing the right  internal mammary artery, transesophageal echocardiogram   . hand surgury Left   . LEFT HEART CATHETERIZATION WITH CORONARY ANGIOGRAM N/A 11/24/2011   Procedure: LEFT HEART CATHETERIZATION WITH CORONARY ANGIOGRAM;  Surgeon: Peter M Martinique, MD;  Location: [facility] C[facility] LAB;  Service: Cardiovascular;  Laterality: N/A;  . LOOP RECORDER IMPLANT N/A 07/06/2012   Procedure: LOOP RECORDER IMPLANT;  Surgeon: Thompson Grayer, MD;  Location: [facility] CATH LAB;  Service: Cardiovascular;  Laterality: N/A;  . MAZE  11/26/2011   Procedure: MAZE;  Surgeon: Rexene Alberts, MD;  Location: [facility];  Service: Open Heart Surgery;  Laterality: N/A;  [place]   . Radiofrequency Ablation for atrial fibrillation  12/30/07 and 06/05/09   afib ablation x 2 by JA  . Radiofrequency ablation for atrial flutter  2005   [facility]I    Family History  Problem Relation Age of Onset  . Dementia Father   . Hypertension Father   . Atrial fibrillation Mother   . Stroke Mother   . Dementia Paternal Grandfather   . Cancer Paternal Grandmother     type unknown   Social History:  reports that he quit smoking about 21 years ago. His smoking use included Cigarettes. He has a 39.00 pack-year smoking history. He has never used smokeless tobacco. He reports that he does not drink alcohol or use drugs.  Allergies:  Allergies  Allergen Reactions  . Zolpidem Tartrate Other (See Comments)    Extremely drowsy the next day  Medications Prior to Admission  Medication Sig Dispense Refill  . acetaminophen (TYLENOL) 500 MG tablet Take 1,000 mg by mouth every 6 (six) hours as needed for mild pain.     Marland Kitchen albuterol (PROVENTIL HFA;VENTOLIN HFA) 108 (90 Base) MCG/ACT inhaler Inhale 2 puffs into the lungs every 6 (six) hours as needed for wheezing or shortness of breath. 1 Inhaler 11  . ALPRAZolam (XANAX) 0.5 MG tablet TAKE 1 TABLET BY MOUTH TWICE A DAY AS NEEDED FOR ANXIETY (Patient taking differently: TAKE 1 TABLET BY MOUTH TWICE A DAY) 180 tablet 1  . apixaban  (ELIQUIS) 5 MG TABS tablet Take 1 tablet (5 mg total) by mouth 2 (two) times daily. 60 tablet 5  . aspirin EC 81 MG EC tablet Take 1 tablet (81 mg total) by mouth daily.    . budesonide-formoterol (SYMBICORT) 160-4.5 MCG/ACT inhaler INHALE 2 PUFFS INTO THE LUNGS 2 (TWO) TIMES DAILY. (Patient taking differently: Inhale 2 puffs into the lungs 2 (two) times daily. ) 30.6 Inhaler 11  . cholecalciferol (VITAMIN D) 1000 units tablet Take 1,000 Units by mouth daily.    Marland Kitchen docusate sodium (COLACE) 100 MG capsule Take 100 mg by mouth daily as needed for mild constipation.    . lovastatin (MEVACOR) 40 MG tablet TAKE 2 TABLETS (80 MG TOTAL) BY MOUTH EVERY EVENING. 180 tablet 3  . metoprolol succinate (TOPROL-XL) 50 MG 24 hr tablet Take 1 and 1/2 tab by mouth daily. Yearly physical due in march must see MD for refills (Patient taking differently: Take 75 mg by mouth daily. Take 1 and 1/2 tab by mouth daily.) 135 tablet 3  . Multiple Vitamin (MULTIVITAMIN WITH MINERALS) TABS tablet Take 1 tablet by mouth daily.    . Olmesartan-Amlodipine-HCTZ 40-5-12.5 MG TABS 1 by mouth every day (Patient taking differently: Take 1 tablet by mouth daily. ) 90 tablet 3  . temazepam (RESTORIL) 30 MG capsule TAKE ONE CAPSULE AT BEDTIME AS NEEDED FOR SLEEP (Patient taking differently: TAKE ONE CAPSULE AT BEDTIME) 90 capsule 1  . traMADol (ULTRAM-ER) 300 MG 24 hr tablet TAKE 1 TABLET EVERY DAY 30 tablet 5    No results found for this or any previous visit (from the past 48 hour(s)). No results found.  Pertinent items noted in HPI and remainder of comprehensive ROS otherwise negative.  Blood pressure (!) 143/79, pulse (!) 50, temperature 97.9 F (36.6 C), temperature source Oral, resp. rate 20, SpO2 100 %.  Patient is awake and alert. He is oriented and appropriate. Speech is fluent. His judgment and insight are intact. His cranial nerve function normal bilaterally. Motor examination reveals weakness of the grips and intrinsics  left greater than right. Patient with moderate spasticity in both lower extremities. Gait is spastic. Reflexes are increased. Hoffmann's responses are present in both hands. Toes are upgoing to plantar stimulation. Examination head ears eyes and throat is unremarkable. Chest and abdomen are benign. Extremities are free from injury or deformity. Assessment/Plan Cervical stenosis with myelopathy. Plan C5-6, C6-7, C7-T1 anterior cervical discectomy and interbody fusion utilizing interbody peek cages, locally harvested autograft, and anterior plate instrumentation. Risks and benefits of been explained. Patient wishes to proceed.  Gerri Acre A 06/20/2016, 7:27 AM

## 2016-06-20 NOTE — Progress Notes (Signed)
Patient c/o left eye pain states he feels like something is in it, we tried flushing out with saline with out any relief. MD notified.  Lonya Johannesen, Tivis Ringer, RN

## 2016-06-20 NOTE — Transfer of Care (Signed)
Immediate Anesthesia Transfer of Care Note  Patient: Scott [name]  Procedure(s) Performed: Procedure(s): Anterior Cervical Discectomy Fusion - Cervical five-Cervical six - Cervical six-Cervical seven - Cervicla seven- Thoracic one (N/A)  Patient Location: PACU  Anesthesia Type:General  Level of Consciousness: awake  Airway & Oxygen Therapy: Patient Spontanous Breathing and Patient connected to face mask oxygen  Post-op Assessment: Report given to RN and Post -op Vital signs reviewed and stable  Post vital signs: Reviewed and stable  Last Vitals:  Vitals:   06/20/16 0637  BP: (!) 143/79  Pulse: (!) 50  Resp: 20  Temp: 36.6 C    Last Pain:  Vitals:   06/20/16 0729  TempSrc:   PainSc: 0-No pain      Patients Stated Pain Goal: 3 (16/96/78 9381)  Complications: No apparent anesthesia complications

## 2016-06-20 NOTE — Anesthesia Preprocedure Evaluation (Addendum)
Anesthesia Evaluation  Patient identified by MRN, date of birth, ID band Patient awake    Reviewed: Allergy & Precautions, H&P , NPO status , Patient's Chart, lab work & pertinent test results, reviewed documented beta blocker date and time   History of Anesthesia Complications (+) history of anesthetic complications  Airway Mallampati: II  TM Distance: >3 FB Neck ROM: Full    Dental  (+) Teeth Intact, Dental Advisory Given   Pulmonary sleep apnea and Continuous Positive Airway Pressure Ventilation , COPD,  COPD inhaler, former smoker,    breath sounds clear to auscultation       Cardiovascular hypertension, Pt. on medications and Pt. on home beta blockers + CAD and +CHF   Rhythm:Regular Rate:Normal     Neuro/Psych Anxiety    GI/Hepatic   Endo/Other  Hypothyroidism   Renal/GU      Musculoskeletal   Abdominal (+) + obese,   Peds  Hematology   Anesthesia Other Findings   Reproductive/Obstetrics                            Anesthesia Physical  Anesthesia Plan  ASA: III  Anesthesia Plan: General   Post-op Pain Management:    Induction: Intravenous  Airway Management Planned: Oral ETT  Additional Equipment:   Intra-op Plan:   Post-operative Plan: Extubation in OR  Informed Consent: I have reviewed the patients History and Physical, chart, labs and discussed the procedure including the risks, benefits and alternatives for the proposed anesthesia with the patient or authorized representative who has indicated his/her understanding and acceptance.   Dental advisory given  Plan Discussed with: CRNA, Anesthesiologist and Surgeon  Anesthesia Plan Comments:        Anesthesia Quick Evaluation

## 2016-06-20 NOTE — Anesthesia Procedure Notes (Signed)
Procedure Name: Intubation Performed by: Maude Leriche D Pre-anesthesia Checklist: Patient identified, Emergency Drugs available, Suction available, Patient being monitored and Timeout performed Patient Re-evaluated:Patient Re-evaluated prior to inductionOxygen Delivery Method: Circle system utilized Preoxygenation: Pre-oxygenation with 100% oxygen Intubation Type: IV induction Ventilation: Mask ventilation without difficulty Laryngoscope Size: Mac and 3 Grade View: Grade III Tube size: 7.5 mm Number of attempts: 2 (DL x 1 by CRNA, with base of arytenoids visualized. and esophageal intubation. Easy mask resumed. Dl x by MDA with MAc 4- base of arytenoids again only visualized. Blue sylet used with successful atraumatic  intubation. ) Airway Equipment and Method: Stylet and Bougie stylet Placement Confirmation: ETT inserted through vocal cords under direct vision,  positive ETCO2 and breath sounds checked- equal and bilateral Secured at: 22 cm Tube secured with: Tape Dental Injury: Teeth and Oropharynx as per pre-operative assessment

## 2016-06-20 NOTE — Brief Op Note (Signed)
06/20/2016  11:04 AM  PATIENT:  Scott [name]  61 y.o. male  PRE-OPERATIVE DIAGNOSIS:  Stenosis  POST-OPERATIVE DIAGNOSIS:  Stenosis  PROCEDURE:  Procedure(s): Anterior Cervical Discectomy Fusion - Cervical five-Cervical six - Cervical six-Cervical seven - Cervicla seven- Thoracic one (N/A)  SURGEON:  Surgeon(s) and Role:    * Earnie Larsson, MD - Primary    * Kevan Ny Ditty, MD - Assisting  PHYSICIAN ASSISTANT:   ASSISTANTS:    ANESTHESIA:   general  EBL:  Total I/O In: 1000 [I.V.:1000] Out: 350 [Blood:350]  BLOOD ADMINISTERED:none  DRAINS: (10 mm) Jackson-Pratt drain(s) with closed bulb suction in the prevertebral space   LOCAL MEDICATIONS USED:  MARCAINE     SPECIMEN:  No Specimen  DISPOSITION OF SPECIMEN:  N/A  COUNTS:  YES  TOURNIQUET:  * No tourniquets in log *  DICTATION: .Dragon Dictation  PLAN OF CARE: Admit to inpatient   PATIENT DISPOSITION:  PACU - hemodynamically stable.   Delay start of Pharmacological VTE agent (>24hrs) due to surgical blood loss or risk of bleeding: yes

## 2016-06-21 MED ORDER — HYDROCODONE-ACETAMINOPHEN 5-325 MG PO TABS: 1.0000 | ORAL_TABLET | ORAL | 0 refills | Status: DC | PRN

## 2016-06-21 MED ORDER — APIXABAN 5 MG PO TABS: 5.0000 mg | ORAL_TABLET | Freq: Two times a day (BID) | ORAL | 5 refills | Status: DC

## 2016-06-21 MED ORDER — CYCLOBENZAPRINE HCL 10 MG PO TABS: 10.0000 mg | ORAL_TABLET | Freq: Three times a day (TID) | ORAL | 0 refills | Status: DC | PRN

## 2016-06-21 MED ORDER — KETOROLAC TROMETHAMINE 0.4 % OP SOLN: 1.0000 [drp] | Freq: Four times a day (QID) | OPHTHALMIC | 0 refills | Status: DC

## 2016-06-21 MED ORDER — ARTIFICIAL TEARS OPHTHALMIC OINT: TOPICAL_OINTMENT | Freq: Three times a day (TID) | OPHTHALMIC | 1 refills | Status: DC

## 2016-06-21 NOTE — Evaluation (Addendum)
Physical Therapy Evaluation Patient Details Name: Scott [name] MRN: [id number] DOB: [birth year] Today's Date: 06/21/2016   History of Present Illness  Patient is a 61 yo male admitted 06/20/16 with progressive weakness and sensory loss in UE's, Lt > Rt, and gait disturbance.  Patient with cervical stenosis with myelopathy.  Patient s/p C5-6, C6-7, C7-T1 anterior cervical discectomy with interbody fusion.    PMH:  CAD, CABG, anxiety, Afib, CHF, COPD, HTN, OSA    Clinical Impression  Patient is functioning at Mod I level with mobility and gait.  Able to negotiate stairs with supervision.  Educated patient on cervical precautions related to mobility.  No further PT needs - PT will sign off.  Patient safe to d/c home today. Contacted Sarah, CM regarding equipment.    Follow Up Recommendations No PT follow up;Supervision - Intermittent    Equipment Recommendations  Rolling walker with 5" wheels;3in1 (PT)    Recommendations for Other Services       Precautions / Restrictions Precautions Precautions: Cervical Precaution Comments: Reviewed cervical precautions with patient and his sister. Required Braces or Orthoses: Cervical Brace Cervical Brace: Soft collar (During ambulation. Don in sitting.) Restrictions Weight Bearing Restrictions: No      Mobility  Bed Mobility Overal bed mobility: Modified Independent             General bed mobility comments: Verbal and tactile cues for proper technique to roll and move sidelying <> sitting.  Practiced x2 and patient able to perform on his own with increased time.  Transfers Overall transfer level: Modified independent Equipment used: Rolling walker (2 wheeled)             General transfer comment: Verbal cues for technique.  Patient able to move to standing with min use of UE's.  Steady with static stance.  Ambulation/Gait Ambulation/Gait assistance: Modified independent (Device/Increase time) Ambulation Distance (Feet): 184  Feet Assistive device: Rolling walker (2 wheeled) Gait Pattern/deviations: Step-through pattern;Decreased stride length Gait velocity: decreased Gait velocity interpretation: Below normal speed for age/gender General Gait Details: Verbal cues for safe use of RW.  No loss of balance with use of RW.  Instructed patient to maintain cervical precautions during gait - especially avoid rotation when talking to someone while walking.  Stairs Stairs: Yes Stairs assistance: Supervision Stair Management: One rail Right;Step to pattern;Forwards Number of Stairs: 3 General stair comments: Instructed patient on step-to technique for safety.  Instructed patient and sister for patient to have assistance on stairs initially for safety.    Wheelchair Mobility    Modified Rankin (Stroke Patients Only)       Balance Overall balance assessment: Modified Independent         Standing balance support: No upper extremity supported Standing balance-Leahy Scale: Fair                               Pertinent Vitals/Pain Pain Assessment: 0-10 Pain Score: 2  Pain Location: Neck Pain Descriptors / Indicators: Sore Pain Intervention(s): Monitored during session    Home Living Family/patient expects to be discharged to:: Private residence Living Arrangements: Alone Available Help at Discharge: Family;Neighbor;Available PRN/intermittently (Sister staying 1 night. Neighbors available.) Type of Home: House Home Access: Stairs to enter Entrance Stairs-Rails: None Entrance Stairs-Number of Steps: 3 Home Layout: One level Home Equipment: Walker - 2 wheels;Bedside commode Additional Comments: Sister is from out of town.  She will stay with patient tonight, and then return  home.  Neighbors available.    Prior Function Level of Independence: Independent         Comments: Very active.  Walks daily.  Drives cars for dealerships.     Hand Dominance        Extremity/Trunk Assessment    Upper Extremity Assessment Upper Extremity Assessment: Overall WFL for tasks assessed (Symmetrical strength.)    Lower Extremity Assessment Lower Extremity Assessment: Overall WFL for tasks assessed    Cervical / Trunk Assessment Cervical / Trunk Assessment: Other exceptions Cervical / Trunk Exceptions: Cervical collar in place.  Communication   Communication: No difficulties  Cognition Arousal/Alertness: Awake/alert Behavior During Therapy: WFL for tasks assessed/performed Overall Cognitive Status: Within Functional Limits for tasks assessed                                        General Comments      Exercises     Assessment/Plan    PT Assessment Patent does not need any further PT services  PT Problem List         PT Treatment Interventions      PT Goals (Current goals can be found in the Care Plan section)  Acute Rehab PT Goals PT Goal Formulation: All assessment and education complete, DC therapy    Frequency     Barriers to discharge        Co-evaluation               End of Session Equipment Utilized During Treatment: Gait belt Activity Tolerance: Patient tolerated treatment well Patient left: in bed;with call bell/phone within reach;with family/visitor present Nurse Communication: Mobility status (No f/u PT needs.  Will need RW and BSC for home.) PT Visit Diagnosis: Difficulty in walking, not elsewhere classified (R26.2);Muscle weakness (generalized) (M62.81);Pain Pain - part of body:  (Neck)    Time: 9030-0923 PT Time Calculation (min) (ACUTE ONLY): 17 min   Charges:   PT Evaluation $PT Eval Moderate Complexity: 1 Procedure     PT G Codes:        Carita Pian. Sanjuana Kava, [facility] Acute Rehab Services Pager [phone]   Despina Pole 06/21/2016, 12:18 PM

## 2016-06-21 NOTE — Care Management Note (Signed)
Case Management Note  Patient Details  Name: Scott [name] MRN: [id number] Date of Birth: 11-13-1955  Subjective/Objective:                  C5-6, C6-7, C7-T1 anterior cervical discectomy with interbody fusion Action/Plan: Discharge planning Expected Discharge Date:  06/21/16               Expected Discharge Plan:  Home/Self Care  In-House Referral:     Discharge planning Services  CM Consult  Post Acute Care Choice:  Durable Medical Equipment Choice offered to:  Patient  DME Arranged:  3-N-1, Walker rolling DME Agency:  [place]:  NA [place] Agency:  NA  Status of Service:  Completed, signed off  If discussed at [place] of Stay Meetings, dates discussed:    Additional Comments: CM received call from PT requesting rolling walker and 3n1 ne delivered to room.  CM notified [place] DME rep, Reggie to please deliver the DME prior to discharge. No other CM needs were communicated. Dellie Catholic, RN 06/21/2016, 11:54 AM

## 2016-06-21 NOTE — Discharge Summary (Signed)
Physician Discharge Summary  Patient ID: Scott [name] MRN: [id number] DOB/AGE: October 11, 1955 61 y.o.  Admit date: 06/20/2016 Discharge date: 06/21/2016  Admission Diagnoses:  Cervical stenosis with myelopathy  Discharge Diagnoses:  Same Active Problems:   Stenosis of cervical spine with myelopathy  Discharged Condition: Stable  Hospital Course:  Scott [name] is a 61 y.o. male who was admitted for below procedure. There were no post operative complications with exception of corneal abrasion. Rx Keotorlac for sx relief. At time of discharge he was ambulating in hall, tolerating po, voiding normal and pain well controlled. Cleared for discharge.  Treatments: Surgery - C5-6, C6-7, C7-T1 anterior cervical discectomy with interbody fusion utilizing interbody peek cages, locally harvested autograft, and anterior plate instrumentation  Discharge Exam: Blood pressure 129/66, pulse 83, temperature 98.3 F (36.8 C), temperature source Oral, resp. rate 17, height 5\' 5"  (1.651 m), weight 84.4 kg (186 lb 1.1 oz), SpO2 98 %. Awake, alert, oriented Speech fluent, appropriate CN grossly intact 5/5 BUE/BLE Wound c/d/i  Disposition: 01-Home or Self Care  Discharge Instructions    Call MD for:  difficulty breathing, headache or visual disturbances    Complete by:  As directed    Call MD for:  persistant dizziness or light-headedness    Complete by:  As directed    Call MD for:  redness, tenderness, or signs of infection (pain, swelling, redness, odor or green/yellow discharge around incision site)    Complete by:  As directed    Call MD for:  severe uncontrolled pain    Complete by:  As directed    Call MD for:  temperature >100.4    Complete by:  As directed    Diet general    Complete by:  As directed    Driving Restrictions    Complete by:  As directed    Do not drive until given clearance.   Increase activity slowly    Complete by:  As directed    Lifting restrictions     Complete by:  As directed    Do not lift anything >10lbs. Avoid bending and twisting in awkward positions. Avoid bending at the back.   Remove dressing in 24 hours    Complete by:  As directed      Allergies as of 06/21/2016      Reactions   Zolpidem Tartrate Other (See Comments)   Extremely drowsy the next day      Medication List    TAKE these medications   acetaminophen 500 MG tablet Commonly known as:  TYLENOL Take 1,000 mg by mouth every 6 (six) hours as needed for mild pain.   albuterol 108 (90 Base) MCG/ACT inhaler Commonly known as:  PROVENTIL HFA;VENTOLIN HFA Inhale 2 puffs into the lungs every 6 (six) hours as needed for wheezing or shortness of breath.   ALPRAZolam 0.5 MG tablet Commonly known as:  XANAX TAKE 1 TABLET BY MOUTH TWICE A DAY AS NEEDED FOR ANXIETY What changed:  See the new instructions.   apixaban 5 MG Tabs tablet Commonly known as:  ELIQUIS Take 1 tablet (5 mg total) by mouth 2 (two) times daily. Start taking on:  06/26/2016   artificial tears Oint ophthalmic ointment Commonly known as:  LACRILUBE Place into the left eye 3 (three) times daily.   aspirin 81 MG EC tablet Take 1 tablet (81 mg total) by mouth daily.   budesonide-formoterol 160-4.5 MCG/ACT inhaler Commonly known as:  SYMBICORT INHALE 2 PUFFS INTO THE LUNGS 2 (  TWO) TIMES DAILY. What changed:  how much to take  how to take this  when to take this  additional instructions   cholecalciferol 1000 units tablet Commonly known as:  VITAMIN D Take 1,000 Units by mouth daily.   cyclobenzaprine 10 MG tablet Commonly known as:  FLEXERIL Take 1 tablet (10 mg total) by mouth 3 (three) times daily as needed for muscle spasms.   docusate sodium 100 MG capsule Commonly known as:  COLACE Take 100 mg by mouth daily as needed for mild constipation.   HYDROcodone-acetaminophen 5-325 MG tablet Commonly known as:  NORCO/VICODIN Take 1-2 tablets by mouth every 4 (four) hours as needed for  severe pain.   lovastatin 40 MG tablet Commonly known as:  MEVACOR TAKE 2 TABLETS (80 MG TOTAL) BY MOUTH EVERY EVENING.   metoprolol succinate 50 MG 24 hr tablet Commonly known as:  TOPROL-XL Take 1 and 1/2 tab by mouth daily. Yearly physical due in march must see MD for refills What changed:  how much to take  how to take this  when to take this  additional instructions   multivitamin with minerals Tabs tablet Take 1 tablet by mouth daily.   Olmesartan-Amlodipine-HCTZ 40-5-12.5 MG Tabs 1 by mouth every day What changed:  how much to take  how to take this  when to take this  additional instructions   temazepam 30 MG capsule Commonly known as:  RESTORIL TAKE ONE CAPSULE AT BEDTIME AS NEEDED FOR SLEEP What changed:  See the new instructions.   traMADol 300 MG 24 hr tablet Commonly known as:  ULTRAM-ER TAKE 1 TABLET EVERY DAY      Follow-up Information    POOL,HENRY A, MD. Schedule an appointment as soon as possible for a visit in 3 day(s).   Specialty:  Neurosurgery Why:  for routine post operative follow up Contact information: [address] [place] [address] [phone]           Signed: Alyson Ingles 06/21/2016, 10:40 AM

## 2016-06-21 NOTE — Progress Notes (Signed)
Patient walked in hallway approximately 30 yards with standby assist and rolling walker. Continue to monitor.

## 2016-06-21 NOTE — Progress Notes (Signed)
After speaking w/Vinni Costella, PA, I pulled JP drain and replaced the dressing aseptically. Tol well.   Ready for home, just waiting for 3in1 and rolling walker.

## 2016-06-21 NOTE — Progress Notes (Signed)
Pt seen and examined Appears ready for discharge but lives alone Would like PT to eval to ensure okay to be home solo Discharge orders placed if cleared by PT

## 2016-06-23 ENCOUNTER — Encounter (HOSPITAL_COMMUNITY): Payer: Self-pay | Admitting: Neurosurgery

## 2016-06-23 ENCOUNTER — Telehealth: Payer: Self-pay | Admitting: Internal Medicine

## 2016-06-23 MED FILL — Thrombin For Soln 5000 Unit: CUTANEOUS | Qty: 5000 | Status: AC

## 2016-06-23 MED FILL — Thrombin For Soln 20000 Unit: CUTANEOUS | Qty: 1 | Status: AC

## 2016-06-23 NOTE — Telephone Encounter (Signed)
Yes, ok to restart now

## 2016-06-23 NOTE — Telephone Encounter (Signed)
Pt called in and has some questions about his meds.  He was off his blood thinner for a surgery and he wanted to know if it was ok to go back on it?

## 2016-06-25 NOTE — Telephone Encounter (Signed)
Called pt and informed him ok to restart

## 2016-06-26 ENCOUNTER — Encounter: Payer: PPO | Admitting: Internal Medicine

## 2016-07-17 ENCOUNTER — Other Ambulatory Visit: Payer: Self-pay | Admitting: Internal Medicine

## 2016-07-17 NOTE — Telephone Encounter (Signed)
faxed

## 2016-07-17 NOTE — Telephone Encounter (Signed)
Done hardcopy to Shir[place]  

## 2016-07-18 ENCOUNTER — Telehealth: Payer: Self-pay | Admitting: Internal Medicine

## 2016-07-18 NOTE — Telephone Encounter (Signed)
Spoke with pt and assured him that the correct Tramadol ER 300mg  was sent in and that the 50mg  is not listed in his chart. I let him know when the pharmacy opens at 9am I will call and get it straightened out and be back in touch with him.

## 2016-07-18 NOTE — Telephone Encounter (Signed)
Patient called in stating that the [facility] filled [facility]mg  and he did not know until he got home. He called the pharmacy, they told him that is what we sent in. I informed the patient I saw we sent the 300mg  ER. He states [facility] is going to call or if we want to call them to get this straight.

## 2016-07-18 NOTE — Telephone Encounter (Signed)
Spoke with pharmacist and informed him the patient does not take 50mg  and that he takes 300mg . I told him the script was faxed over on the 23rd and I'm not sure what the mix up was. I then informed the pt that the situation had been corrected and that he can pick up the script today and take back the 50mg  he picked up and be refunded per the pharmacist.

## 2016-07-25 DIAGNOSIS — M4802 Spinal stenosis, cervical region: Secondary | ICD-10-CM | POA: Diagnosis not present

## 2016-07-29 ENCOUNTER — Other Ambulatory Visit: Payer: Self-pay

## 2016-07-29 NOTE — Patient Outreach (Signed)
[place] [facility]) Care Management  07/29/2016  Scott [name] [name] Jul 08, 1955 [id number]   Telephone call to patient for outreach call.  No answer.  HIPAA compliant voice message left.    Plan: RN Health Coach will attempt patient again in the month of June.  [name], RN, MSN [place] [phone]

## 2016-08-13 ENCOUNTER — Encounter: Payer: Self-pay | Admitting: Internal Medicine

## 2016-08-13 ENCOUNTER — Ambulatory Visit (INDEPENDENT_AMBULATORY_CARE_PROVIDER_SITE_OTHER): Payer: PPO | Admitting: Internal Medicine

## 2016-08-13 VITALS — BP 162/94 | HR 64 | Ht 66.0 in | Wt 184.0 lb

## 2016-08-13 DIAGNOSIS — J449 Chronic obstructive pulmonary disease, unspecified: Secondary | ICD-10-CM

## 2016-08-13 DIAGNOSIS — I10 Essential (primary) hypertension: Secondary | ICD-10-CM

## 2016-08-13 DIAGNOSIS — M25562 Pain in left knee: Secondary | ICD-10-CM

## 2016-08-13 DIAGNOSIS — I5032 Chronic diastolic (congestive) heart failure: Secondary | ICD-10-CM | POA: Diagnosis not present

## 2016-08-13 MED ORDER — HYDROCODONE-ACETAMINOPHEN 10-325 MG PO TABS: 1.0000 | ORAL_TABLET | Freq: Four times a day (QID) | ORAL | 0 refills | Status: DC | PRN

## 2016-08-13 NOTE — Assessment & Plan Note (Signed)
stable overall by history and exam, and pt to continue medical treatment as before,  to f/u any worsening symptoms or concerns 

## 2016-08-13 NOTE — Assessment & Plan Note (Signed)
Has hx of meniscal tear, but Today exam c/w iliotibial band tendonitis; to reduce walking for 1 wk, ice tid, tumeric otc prn, pain control, and left knee soft brace for 1 wk as well; consider f/u with Dr Smith/sports medicine for further evaluation

## 2016-08-13 NOTE — Patient Instructions (Addendum)
You appear to have the left lateral knee pain from:  Iliotibial Band Syndrome  Please take all new medication as prescribed - the pain medication  OK for ICE three times per day, soft knee brace (neoprene) only with walking in the day, and tumeric OTC  Please hold off on walking so much, such as 1/2 mile only per day for 1 week  Please see Dr Tamala Julian if not improved  Please continue all other medications as before, and refills have been done if requested.  Please have the pharmacy call with any other refills you may need.  Please keep your appointments with your specialists as you may have planned

## 2016-08-13 NOTE — Assessment & Plan Note (Signed)
Mild elevated today, likely reactive, ok to cont same tx for now, cont to monitir BP at home and next visit

## 2016-08-13 NOTE — Progress Notes (Signed)
Subjective:    Patient ID: Scott [name], male    DOB: June 03, 1955, 61 y.o.   MRN: [id number]  HPI  Here to f/u with 3 days onset severe pain to the left knee lateral aspect, rated 10/10 and not amenable to hydrocodone 5 325 prn he had available left over at home.  Walks 4 miles per day with his dog, with more pain recently. No trauma, swelling, or knee giveaways or swelling.  No fever or skin change such as redness.  No LBP or neuritic LLE pain.  Pain is only at the lateral left knee, worse with ambulation but no more distal claudication symptoms.  Pt denies chest pain, increased sob or doe, wheezing, orthopnea, PND, increased LE swelling, palpitations, dizziness or syncope.   Pt denies polydipsia, polyuria   Past Medical History:  Diagnosis Date  . ALLERGIC RHINITIS 01/14/2007  . ANXIETY 09/28/2006  . Asbestos exposure   . Atrial fibrillation ([place]) 09/28/2006   s/p afib ablation x 2  . CHF (congestive heart failure) ([place])   . COPD (chronic obstructive pulmonary disease) ([place])   . Coronary artery disease 11/24/2011  . Diastolic dysfunction 10/02/3732  . Dyspnea    W/ EXERTION   . Dysrhythmia    AFIB     . Gallstones   . HYPERLIPIDEMIA 09/28/2006  . HYPERTENSION 09/28/2006  . HYPOTHYROIDISM, ACQUIRED NEC 09/28/2006   pt denies and doesn't know  . Long term current use of anticoagulant 04/08/2010  . OBSTRUCTIVE SLEEP APNEA 01/06/2008   NOT USING    . Right knee DJD 07/09/2010  . S/P CABG x 1 11/26/2011   Right internal mammary artery to right coronary artery  . S/P Maze operation for atrial fibrillation 11/26/2011   complete biatrial lesion set with clipping of LA appendage   Past Surgical History:  Procedure Laterality Date  . ANTERIOR CERVICAL DECOMP/DISCECTOMY FUSION N/A 06/20/2016   Procedure: Anterior Cervical Discectomy Fusion - Cervical five-Cervical six - Cervical six-Cervical seven - Cervicla seven- Thoracic one;  Surgeon: Earnie Larsson, MD;  Location: [place];  Service: Neurosurgery;   Laterality: N/A;  . CARDIOVERSION     X4   . CERVICAL FUSION  06/20/2016   C5-6, C6-7, C7-T1 anterior cervical discectomy with interbody fusion utilizing interbody peek cages, locally harvested autograft, and anterior plate instrumentation  . CORONARY ARTERY BYPASS GRAFT  11/26/2011   Procedure: CORONARY ARTERY BYPASS GRAFTING (CABG);  Surgeon: Rexene Alberts, MD;  Location: [place];  Service: Open Heart Surgery;  Laterality: N/A;  coronary artery bypass on pump times one utilizing the right internal mammary artery, transesophageal echocardiogram   . hand surgury Left   . LEFT HEART CATHETERIZATION WITH CORONARY ANGIOGRAM N/A 11/24/2011   Procedure: LEFT HEART CATHETERIZATION WITH CORONARY ANGIOGRAM;  Surgeon: Peter M Martinique, MD;  Location: [facility] CATH LAB;  Service: Cardiovascular;  Laterality: N/A;  . LOOP RECORDER IMPLANT N/A 07/06/2012   Procedure: LOOP RECORDER IMPLANT;  Surgeon: Thompson Grayer, MD;  Location: [facility] CATH LAB;  Service: Cardiovascular;  Laterality: N/A;  . MAZE  11/26/2011   Procedure: MAZE;  Surgeon: Rexene Alberts, MD;  Location: [place];  Service: Open Heart Surgery;  Laterality: N/A;  [place]   . Radiofrequency Ablation for atrial fibrillation  12/30/07 and 06/05/09   afib ablation x 2 by JA  . Radiofrequency ablation for atrial flutter  2005   [facility]    reports that he quit smoking about 21 years ago. His smoking use included Cigarettes. He has  a 39.00 pack-year smoking history. He has never used smokeless tobacco. He reports that he does not drink alcohol or use drugs. family history includes Atrial fibrillation in his mother; Cancer in his paternal grandmother; Dementia in his father and paternal grandfather; Hypertension in his father; Stroke in his mother. Allergies  Allergen Reactions  . Zolpidem Tartrate Other (See Comments)    Extremely drowsy the next day   Current Outpatient Prescriptions on File Prior to Visit  Medication Sig Dispense Refill  . acetaminophen (TYLENOL) 500  MG tablet Take 1,000 mg by mouth every 6 (six) hours as needed for mild pain.     Marland Kitchen albuterol (PROVENTIL HFA;VENTOLIN HFA) 108 (90 Base) MCG/ACT inhaler Inhale 2 puffs into the lungs every 6 (six) hours as needed for wheezing or shortness of breath. 1 Inhaler 11  . ALPRAZolam (XANAX) 0.5 MG tablet TAKE 1 TABLET BY MOUTH TWICE A DAY AS NEEDED FOR ANXIETY (Patient taking differently: TAKE 1 TABLET BY MOUTH TWICE A DAY) 180 tablet 1  . apixaban (ELIQUIS) 5 MG TABS tablet Take 1 tablet (5 mg total) by mouth 2 (two) times daily. 60 tablet 5  . artificial tears (LACRILUBE) OINT ophthalmic ointment Place into the left eye 3 (three) times daily. 7 g 1  . aspirin EC 81 MG EC tablet Take 1 tablet (81 mg total) by mouth daily.    . budesonide-formoterol (SYMBICORT) 160-4.5 MCG/ACT inhaler INHALE 2 PUFFS INTO THE LUNGS 2 (TWO) TIMES DAILY. (Patient taking differently: Inhale 2 puffs into the lungs 2 (two) times daily. ) 30.6 Inhaler 11  . cholecalciferol (VITAMIN D) 1000 units tablet Take 1,000 Units by mouth daily.    . cyclobenzaprine (FLEXERIL) 10 MG tablet Take 1 tablet (10 mg total) by mouth 3 (three) times daily as needed for muscle spasms. 30 tablet 0  . docusate sodium (COLACE) 100 MG capsule Take 100 mg by mouth daily as needed for mild constipation.    Marland Kitchen ketorolac (ACULAR) 0.4 % SOLN Place 1 drop into the left eye 4 (four) times daily. 5 mL 0  . lovastatin (MEVACOR) 40 MG tablet TAKE 2 TABLETS (80 MG TOTAL) BY MOUTH EVERY EVENING. 180 tablet 3  . metoprolol succinate (TOPROL-XL) 50 MG 24 hr tablet Take 1 and 1/2 tab by mouth daily. Yearly physical due in march must see MD for refills (Patient taking differently: Take 75 mg by mouth daily. Take 1 and 1/2 tab by mouth daily.) 135 tablet 3  . Multiple Vitamin (MULTIVITAMIN WITH MINERALS) TABS tablet Take 1 tablet by mouth daily.    . Olmesartan-Amlodipine-HCTZ 40-5-12.5 MG TABS 1 by mouth every day (Patient taking differently: Take 1 tablet by mouth daily.  ) 90 tablet 3  . temazepam (RESTORIL) 30 MG capsule TAKE ONE CAPSULE AT BEDTIME AS NEEDED FOR SLEEP (Patient taking differently: TAKE ONE CAPSULE AT BEDTIME) 90 capsule 1  . traMADol (ULTRAM-ER) 300 MG 24 hr tablet TAKE 1 TABLET EVERY DAY 30 tablet 5   No current facility-administered medications on file prior to visit.    Review of Systems  Constitutional: Negative for other unusual diaphoresis or sweats HENT: Negative for ear discharge or swelling Eyes: Negative for other worsening visual disturbances Respiratory: Negative for stridor or other swelling  Gastrointestinal: Negative for worsening distension or other blood Genitourinary: Negative for retention or other urinary change Musculoskeletal: Negative for other MSK pain or swelling Skin: Negative for color change or other new lesions Neurological: Negative for worsening tremors and other numbness  Psychiatric/Behavioral:  Negative for worsening agitation or other fatigue All other system neg per pt    Objective:   Physical Exam BP (!) 162/94   Pulse 64   Ht 5\' 6"  (1.676 m)   Wt 184 lb (83.5 kg)   SpO2 99%   BMI 29.70 kg/m  VS noted,  Constitutional: Pt appears in NAD HENT: Head: NCAT.  Right Ear: External ear normal.  Left Ear: External ear normal.  Eyes: . Pupils are equal, round, and reactive to light. Conjunctivae and EOM are normal Nose: without d/c or deformity Neck: Neck supple. Gross normal ROM Cardiovascular: Normal rate and regular rhythm.   Pulmonary/Chest: Effort normal and breath sounds without rales or wheezing.  Neurological: Pt is alert. At baseline orientation, motor grossly intact Left knee with warm and trace swelling at the iliotibial band insertion site, but o/w left knee is FROM, NT without effusion but with some mild crepitus Skin: Skin is warm. No rashes, other new lesions, no LE edema Psychiatric: Pt behavior is normal without agitation  No other exam findings    Assessment & Plan:

## 2016-08-18 ENCOUNTER — Other Ambulatory Visit: Payer: Self-pay

## 2016-08-18 NOTE — Patient Outreach (Signed)
[name]) Care Management  [place]  08/18/2016   Scott [name] 12-29-1955 [id number]  Subjective: Telephone call to patient for every other month call.  Patient reports he is doing ok.  However, he reports a fall last week after he missed a step. He states some back injury but is taking hydrocodone ordered by physician.  Discussed with patient falls precautions. Patient denies any problems with his atrial fibrillation.  Discussed with patient signs of atrial fibrillation and when to notify physician.  He verbalized understanding.   Objective:   Encounter Medications:  Outpatient Encounter Prescriptions as of 08/18/2016  Medication Sig Note  . acetaminophen (TYLENOL) 500 MG tablet Take 1,000 mg by mouth every 6 (six) hours as needed for mild pain.    Marland Kitchen albuterol (PROVENTIL HFA;VENTOLIN HFA) 108 (90 Base) MCG/ACT inhaler Inhale 2 puffs into the lungs every 6 (six) hours as needed for wheezing or shortness of breath.   . ALPRAZolam (XANAX) 0.5 MG tablet TAKE 1 TABLET BY MOUTH TWICE A DAY AS NEEDED FOR ANXIETY (Patient taking differently: TAKE 1 TABLET BY MOUTH TWICE A DAY)   . apixaban (ELIQUIS) 5 MG TABS tablet Take 1 tablet (5 mg total) by mouth 2 (two) times daily.   Marland Kitchen aspirin EC 81 MG EC tablet Take 1 tablet (81 mg total) by mouth daily.   . budesonide-formoterol (SYMBICORT) 160-4.5 MCG/ACT inhaler INHALE 2 PUFFS INTO THE LUNGS 2 (TWO) TIMES DAILY. (Patient taking differently: Inhale 2 puffs into the lungs 2 (two) times daily. )   . cholecalciferol (VITAMIN D) 1000 units tablet Take 1,000 Units by mouth daily.   Marland Kitchen docusate sodium (COLACE) 100 MG capsule Take 100 mg by mouth daily as needed for mild constipation.   Marland Kitchen HYDROcodone-acetaminophen (NORCO) 10-325 MG tablet Take 1 tablet by mouth every 6 (six) hours as needed.   . lovastatin (MEVACOR) 40 MG tablet TAKE 2 TABLETS (80 MG TOTAL) BY MOUTH EVERY EVENING.   . metoprolol succinate (TOPROL-XL) 50 MG 24 hr  tablet Take 1 and 1/2 tab by mouth daily. Yearly physical due in march must see MD for refills (Patient taking differently: Take 75 mg by mouth daily. Take 1 and 1/2 tab by mouth daily.) 06/20/2016: 530am  . Multiple Vitamin (MULTIVITAMIN WITH MINERALS) TABS tablet Take 1 tablet by mouth daily.   . Olmesartan-Amlodipine-HCTZ 40-5-12.5 MG TABS 1 by mouth every day (Patient taking differently: Take 1 tablet by mouth daily. )   . temazepam (RESTORIL) 30 MG capsule TAKE ONE CAPSULE AT BEDTIME AS NEEDED FOR SLEEP (Patient taking differently: TAKE ONE CAPSULE AT BEDTIME)   . traMADol (ULTRAM-ER) 300 MG 24 hr tablet TAKE 1 TABLET EVERY DAY   . artificial tears (LACRILUBE) OINT ophthalmic ointment Place into the left eye 3 (three) times daily. (Patient not taking: Reported on 08/18/2016)   . cyclobenzaprine (FLEXERIL) 10 MG tablet Take 1 tablet (10 mg total) by mouth 3 (three) times daily as needed for muscle spasms. (Patient not taking: Reported on 08/18/2016)   . ketorolac (ACULAR) 0.4 % SOLN Place 1 drop into the left eye 4 (four) times daily. (Patient not taking: Reported on 08/18/2016)    No facility-administered encounter medications on file as of 08/18/2016.     Functional Status:  In your present state of health, do you have any difficulty performing the following activities: 06/20/2016 06/20/2016  Hearing? - N  Vision? - N  Difficulty concentrating or making decisions? - N  Walking or climbing stairs? -  N  Dressing or bathing? - N  Doing errands, shopping? N -  Preparing Food and eating ? - -  Using the Toilet? - -  In the past six months, have you accidently leaked urine? - -  Do you have problems with loss of bowel control? - -  Managing your Medications? - -  Managing your Finances? - -  Housekeeping or managing your Housekeeping? - -  Some recent data might be hidden    Fall/Depression Screening: Fall Risk  08/18/2016 06/10/2016 05/15/2016  Falls in the past year? Yes No No  Number falls  in past yr: 1 - -  Injury with Fall? - - -  Risk Factor Category  - - -  Risk for fall due to : - - -   PHQ 2/9 Scores 08/18/2016 06/10/2016 05/15/2016 04/15/2016 03/21/2016 02/15/2016 12/13/2015  PHQ - 2 Score 0 0 0 0 0 0 0    Assessment: Patient continues to benefit from health coach outreach for disease management and support.   Plan:  [facility] CM Care Plan Problem One     Most Recent Value  Care Plan Problem One  Knowledge deficit Atrial Fibrillation  Role Documenting the Problem One  [place] for Problem One  Active  [facility] Long Term Goal   Patient will report knowing signs and symptoms of acute atrial fibrillation and when to notify physician within 90 days.  [facility] Long Term Goal Start Date  08/18/16 [goal continued]  Interventions for Problem One Long Term Goal  RN Health Coach reviewed with patient signs and symptoms of atrial fibrillation.  Also reviewed signs of heart failure with patient.       RN Health Coach will contact patient in the month of August and patient agrees to next outreach.  [name], RN, MSN [place] [phone]

## 2016-08-22 DIAGNOSIS — M4802 Spinal stenosis, cervical region: Secondary | ICD-10-CM | POA: Diagnosis not present

## 2016-08-25 ENCOUNTER — Ambulatory Visit (INDEPENDENT_AMBULATORY_CARE_PROVIDER_SITE_OTHER): Payer: PPO | Admitting: Internal Medicine

## 2016-08-25 ENCOUNTER — Encounter: Payer: Self-pay | Admitting: Internal Medicine

## 2016-08-25 VITALS — BP 136/76 | HR 76 | Ht 66.0 in | Wt 182.6 lb

## 2016-08-25 DIAGNOSIS — I480 Paroxysmal atrial fibrillation: Secondary | ICD-10-CM | POA: Diagnosis not present

## 2016-08-25 NOTE — Patient Instructions (Signed)
Medication Instructions:  Your physician has recommended you make the following change in your medication: stop taking aspirin.   Labwork: None ordered.  Testing/Procedures: None ordered.  Follow-Up: Your physician wants you to follow-up in: one year with Dr. Lovena Le.   You will receive a reminder letter in the mail two months in advance. If you don't receive a letter, please call our office to schedule the follow-up appointment.   Any Other Special Instructions Will Be Listed Below (If Applicable).   If you need a refill on your cardiac medications before your next appointment, please call your pharmacy.

## 2016-08-25 NOTE — Progress Notes (Signed)
HPI [name] returns today for followup. He is a very pleasant middle-age man with a history of atrial fibrillation and flutter, status post multiple ablations, who also has coronary disease, and underwent single-vessel bypass and maze surgery just a year ago. In the interim he has been stable but notes that he has about an hour of atrial fib a month. He walks up to 4 miles a day. He denies chest pain. He has not been able to lose weight.  Allergies  Allergen Reactions  . Zolpidem Tartrate Other (See Comments)    Extremely drowsy the next day     Current Outpatient Prescriptions  Medication Sig Dispense Refill  . acetaminophen (TYLENOL) 500 MG tablet Take 1,000 mg by mouth every 6 (six) hours as needed for mild pain.     Marland Kitchen albuterol (PROVENTIL HFA;VENTOLIN HFA) 108 (90 Base) MCG/ACT inhaler Inhale 2 puffs into the lungs every 6 (six) hours as needed for wheezing or shortness of breath. 1 Inhaler 11  . ALPRAZolam (XANAX) 0.5 MG tablet TAKE 1 TABLET BY MOUTH TWICE A DAY AS NEEDED FOR ANXIETY (Patient taking differently: TAKE 1 TABLET BY MOUTH TWICE A DAY) 180 tablet 1  . apixaban (ELIQUIS) 5 MG TABS tablet Take 1 tablet (5 mg total) by mouth 2 (two) times daily. 60 tablet 5  . budesonide-formoterol (SYMBICORT) 160-4.5 MCG/ACT inhaler INHALE 2 PUFFS INTO THE LUNGS 2 (TWO) TIMES DAILY. (Patient taking differently: Inhale 2 puffs into the lungs 2 (two) times daily. ) 30.6 Inhaler 11  . cholecalciferol (VITAMIN D) 1000 units tablet Take 1,000 Units by mouth daily.    Marland Kitchen docusate sodium (COLACE) 100 MG capsule Take 100 mg by mouth daily as needed for mild constipation.    . lovastatin (MEVACOR) 40 MG tablet TAKE 2 TABLETS (80 MG TOTAL) BY MOUTH EVERY EVENING. 180 tablet 3  . metoprolol succinate (TOPROL-XL) 50 MG 24 hr tablet Take 1 and 1/2 tab by mouth daily. Yearly physical due in march must see MD for refills (Patient taking differently: Take 75 mg by mouth daily. Take 1 and 1/2 tab by mouth  daily.) 135 tablet 3  . Multiple Vitamin (MULTIVITAMIN WITH MINERALS) TABS tablet Take 1 tablet by mouth daily.    . Olmesartan-Amlodipine-HCTZ 40-5-12.5 MG TABS 1 by mouth every day (Patient taking differently: Take 1 tablet by mouth daily. ) 90 tablet 3  . temazepam (RESTORIL) 30 MG capsule TAKE ONE CAPSULE AT BEDTIME AS NEEDED FOR SLEEP (Patient taking differently: TAKE ONE CAPSULE AT BEDTIME) 90 capsule 1  . traMADol (ULTRAM-ER) 300 MG 24 hr tablet TAKE 1 TABLET EVERY DAY 30 tablet 5   No current facility-administered medications for this visit.      Past Medical History:  Diagnosis Date  . ALLERGIC RHINITIS 01/14/2007  . ANXIETY 09/28/2006  . Asbestos exposure   . Atrial fibrillation ([place]) 09/28/2006   s/p afib ablation x 2  . CHF (congestive heart failure) ([place])   . COPD (chronic obstructive pulmonary disease) ([place])   . Coronary artery disease 11/24/2011  . Diastolic dysfunction 03/03/15  . Dyspnea    W/ EXERTION   . Dysrhythmia    AFIB     . Gallstones   . HYPERLIPIDEMIA 09/28/2006  . HYPERTENSION 09/28/2006  . HYPOTHYROIDISM, ACQUIRED NEC 09/28/2006   pt denies and doesn't know  . Long term current use of anticoagulant 04/08/2010  . OBSTRUCTIVE SLEEP APNEA 01/06/2008   NOT USING    . Right knee DJD  07/09/2010  . S/P CABG x 1 11/26/2011   Right internal mammary artery to right coronary artery  . S/P Maze operation for atrial fibrillation 11/26/2011   complete biatrial lesion set with clipping of LA appendage    ROS:   All systems reviewed and negative except as noted in the HPI.   Past Surgical History:  Procedure Laterality Date  . ANTERIOR CERVICAL DECOMP/DISCECTOMY FUSION N/A 06/20/2016   Procedure: Anterior Cervical Discectomy Fusion - Cervical five-Cervical six - Cervical six-Cervical seven - Cervicla seven- Thoracic one;  Surgeon: Earnie Larsson, MD;  Location: [facility];  Service: Neurosurgery;  Laterality: N/A;  . CARDIOVERSION     X4   . CERVICAL FUSION  06/20/2016   C5-6,  C6-7, C7-T1 anterior cervical discectomy with interbody fusion utilizing interbody peek cages, locally harvested autograft, and anterior plate instrumentation  . CORONARY ARTERY BYPASS GRAFT  11/26/2011   Procedure: CORONARY ARTERY BYPASS GRAFTING (CABG);  Surgeon: Rexene Alberts, MD;  Location: [facility];  Service: Open Heart Surgery;  Laterality: N/A;  coronary artery bypass on pump times one utilizing the right internal mammary artery, transesophageal echocardiogram   . hand surgury Left   . LEFT HEART CATHETERIZATION WITH CORONARY ANGIOGRAM N/A 11/24/2011   Procedure: LEFT HEART CATHETERIZATION WITH CORONARY ANGIOGRAM;  Surgeon: Peter M Martinique, MD;  Location: [facility] CATH LAB;  Service: Cardiovascular;  Laterality: N/A;  . LOOP RECORDER IMPLANT N/A 07/06/2012   Procedure: LOOP RECORDER IMPLANT;  Surgeon: Thompson Grayer, MD;  Location: [facility] CATH LAB;  Service: Cardiovascular;  Laterality: N/A;  . MAZE  11/26/2011   Procedure: MAZE;  Surgeon: Rexene Alberts, MD;  Location: [facility];  Service: Open Heart Surgery;  Laterality: N/A;  Cryomaze   . Radiofrequency Ablation for atrial fibrillation  12/30/07 and 06/05/09   afib ablation x 2 by JA  . Radiofrequency ablation for atrial flutter  2005   CTI     Family History  Problem Relation Age of Onset  . Dementia Father   . Hypertension Father   . Atrial fibrillation Mother   . Stroke Mother   . Dementia Paternal Grandfather   . Cancer Paternal Grandmother        type unknown     Social History   Social History  . Marital status: Single    Spouse name: N/A  . Number of children: 0  . Years of education: N/A   Occupational History  . worked previously in the [employer] and feels that he was exposed to asbestos. [employer]   Social History Main Topics  . Smoking status: Former Smoker    Packs/day: 1.50    Years: 26.00    Types: Cigarettes    Quit date: 04/06/1995  . Smokeless tobacco: Never Used  . Alcohol use No  . Drug use: No    . Sexual activity: Yes   Other Topics Concern  . Not on file   Social History Narrative   Pt lives in [place], [place] with his friend.      BP 136/76   Pulse 76   Ht 5\' 6"  (1.676 m)   Wt 182 lb 9.6 oz (82.8 kg)   BMI 29.47 kg/m   Physical Exam:  Well appearing 61 yo man, NAD HEENT: Unremarkable Neck:  6 cm JVD, no thyromegally Back:  No CVA tenderness Lungs:  Clear with no wheezes HEART:  Regular rate rhythm, no murmurs, no rubs, no clicks Abd:  soft, positive bowel sounds,  no organomegally, no rebound, no guarding Ext:  2 plus pulses, no edema, no cyanosis, no clubbing Skin:  No rashes no nodules Neuro:  CN II through XII intact, motor grossly intact   DEVICE  ILR at end of service  Assess/Plan: 1. Atrial fib - he is maintaining nsr mostly. No change in meds. 2. HTN - his blood pressure is elevated. He will work on reducing his salt consumption. 3. Chronic diastolic heart failure - his symptoms are class 2. Will follow.  Mikle Bosworth.D.

## 2016-09-10 ENCOUNTER — Ambulatory Visit (INDEPENDENT_AMBULATORY_CARE_PROVIDER_SITE_OTHER): Payer: PPO | Admitting: Internal Medicine

## 2016-09-10 ENCOUNTER — Encounter: Payer: Self-pay | Admitting: Internal Medicine

## 2016-09-10 VITALS — BP 126/78 | HR 68 | Ht 66.0 in | Wt 187.0 lb

## 2016-09-10 DIAGNOSIS — M1712 Unilateral primary osteoarthritis, left knee: Secondary | ICD-10-CM | POA: Diagnosis not present

## 2016-09-10 DIAGNOSIS — M79605 Pain in left leg: Secondary | ICD-10-CM | POA: Diagnosis not present

## 2016-09-10 DIAGNOSIS — R1032 Left lower quadrant pain: Secondary | ICD-10-CM

## 2016-09-10 DIAGNOSIS — I739 Peripheral vascular disease, unspecified: Secondary | ICD-10-CM

## 2016-09-10 DIAGNOSIS — R0781 Pleurodynia: Secondary | ICD-10-CM

## 2016-09-10 NOTE — Progress Notes (Signed)
Subjective:    Patient ID: Scott [name], Scott [name]    DOB: [birth year], 61 y.o.   MRN: [id number]  HPI  Here with f/u LLE pain, has known left knee issue but this seems different, as main location of needle like pain seems to be below the left groin a rea, sort of in the middle of the left anterior thigh, now moderate, constant, worse to stand up and walk, better to sit.  Left knee brace has helped the left knee but not this pain, and now left knee pain worse in last few days.  Has seen sports medicine and helped but feels he needs ortho now. Has also pain to the calf worse to ambulate.  Also c/o "dent" area to the left lateral rib cage more posteriorly now persistent for 3 wks after he took a slip and fall backwards, hitting the rib area on a paving stone. Pt denies chest pain, increased sob or doe, wheezing, orthopnea, PND, increased LE swelling, palpitations, dizziness or syncope. Past Medical History:  Diagnosis Date  . ALLERGIC RHINITIS 01/14/2007  . ANXIETY 09/28/2006  . Asbestos exposure   . Atrial fibrillation ([place]) 09/28/2006   s/p afib ablation x 2  . CHF (congestive heart failure) ([place])   . COPD (chronic obstructive pulmonary disease) ([place])   . Coronary artery disease 11/24/2011  . Diastolic dysfunction 3/0/8657  . Dyspnea    W/ EXERTION   . Dysrhythmia    AFIB     . Gallstones   . HYPERLIPIDEMIA 09/28/2006  . HYPERTENSION 09/28/2006  . HYPOTHYROIDISM, ACQUIRED NEC 09/28/2006   pt denies and doesn't know  . Long term current use of anticoagulant 04/08/2010  . OBSTRUCTIVE SLEEP APNEA 01/06/2008   NOT USING    . Right knee DJD 07/09/2010  . S/P CABG x 1 11/26/2011   Right internal mammary artery to right coronary artery  . S/P Maze operation for atrial fibrillation 11/26/2011   complete biatrial lesion set with clipping of LA appendage   Past Surgical History:  Procedure Laterality Date  . ANTERIOR CERVICAL DECOMP/DISCECTOMY FUSION N/A 06/20/2016   Procedure: Anterior Cervical Discectomy  Fusion - Cervical five-Cervical six - Cervical six-Cervical seven - Cervicla seven- Thoracic one;  Surgeon: Earnie Larsson, MD;  Location: [place];  Service: Neurosurgery;  Laterality: N/A;  . CARDIOVERSION     X4   . CERVICAL FUSION  06/20/2016   C5-6, C6-7, C7-T1 anterior cervical discectomy with interbody fusion utilizing interbody peek cages, locally harvested autograft, and anterior plate instrumentation  . CORONARY ARTERY BYPASS GRAFT  11/26/2011   Procedure: CORONARY ARTERY BYPASS GRAFTING (CABG);  Surgeon: Rexene Alberts, MD;  Location: [place];  Service: Open Heart Surgery;  Laterality: N/A;  coronary artery bypass on pump times one utilizing the right internal mammary artery, transesophageal echocardiogram   . hand surgury Left   . LEFT HEART CATHETERIZATION WITH CORONARY ANGIOGRAM N/A 11/24/2011   Procedure: LEFT HEART CATHETERIZATION WITH CORONARY ANGIOGRAM;  Surgeon: Peter M Martinique, MD;  Location: [facility] CATH LAB;  Service: Cardiovascular;  Laterality: N/A;  . LOOP RECORDER IMPLANT N/A 07/06/2012   Procedure: LOOP RECORDER IMPLANT;  Surgeon: Thompson Grayer, MD;  Location: [facility] CATH LAB;  Service: Cardiovascular;  Laterality: N/A;  . MAZE  11/26/2011   Procedure: MAZE;  Surgeon: Rexene Alberts, MD;  Location: [place];  Service: Open Heart Surgery;  Laterality: N/A;  Cryomaze   . Radiofrequency Ablation for atrial fibrillation  12/30/07 and 06/05/09   afib ablation  x 2 by JA  . Radiofrequency ablation for atrial flutter  2005   CTI    reports that he quit smoking about 21 years ago. His smoking use included Cigarettes. He has a 39.00 pack-year smoking history. He has never used smokeless tobacco. He reports that he does not drink alcohol or use drugs. family history includes Atrial fibrillation in his mother; Cancer in his paternal grandmother; Dementia in his father and paternal grandfather; Hypertension in his father; Stroke in his mother. Allergies  Allergen Reactions  . Zolpidem Tartrate Other (See  Comments)    Extremely drowsy the next day   Current Outpatient Prescriptions on File Prior to Visit  Medication Sig Dispense Refill  . acetaminophen (TYLENOL) 500 MG tablet Take 1,000 mg by mouth every 6 (six) hours as needed for mild pain.     Marland Kitchen albuterol (PROVENTIL HFA;VENTOLIN HFA) 108 (90 Base) MCG/ACT inhaler Inhale 2 puffs into the lungs every 6 (six) hours as needed for wheezing or shortness of breath. 1 Inhaler 11  . ALPRAZolam (XANAX) 0.5 MG tablet TAKE 1 TABLET BY MOUTH TWICE A DAY AS NEEDED FOR ANXIETY (Patient taking differently: TAKE 1 TABLET BY MOUTH TWICE A DAY) 180 tablet 1  . apixaban (ELIQUIS) 5 MG TABS tablet Take 1 tablet (5 mg total) by mouth 2 (two) times daily. 60 tablet 5  . budesonide-formoterol (SYMBICORT) 160-4.5 MCG/ACT inhaler INHALE 2 PUFFS INTO THE LUNGS 2 (TWO) TIMES DAILY. (Patient taking differently: Inhale 2 puffs into the lungs 2 (two) times daily. ) 30.6 Inhaler 11  . cholecalciferol (VITAMIN D) 1000 units tablet Take 1,000 Units by mouth daily.    Marland Kitchen docusate sodium (COLACE) 100 MG capsule Take 100 mg by mouth daily as needed for mild constipation.    . lovastatin (MEVACOR) 40 MG tablet TAKE 2 TABLETS (80 MG TOTAL) BY MOUTH EVERY EVENING. 180 tablet 3  . metoprolol succinate (TOPROL-XL) 50 MG 24 hr tablet Take 1 and 1/2 tab by mouth daily. Yearly physical due in march must see MD for refills (Patient taking differently: Take 75 mg by mouth daily. Take 1 and 1/2 tab by mouth daily.) 135 tablet 3  . Multiple Vitamin (MULTIVITAMIN WITH MINERALS) TABS tablet Take 1 tablet by mouth daily.    . Olmesartan-Amlodipine-HCTZ 40-5-12.5 MG TABS 1 by mouth every day (Patient taking differently: Take 1 tablet by mouth daily. ) 90 tablet 3  . temazepam (RESTORIL) 30 MG capsule TAKE ONE CAPSULE AT BEDTIME AS NEEDED FOR SLEEP (Patient taking differently: TAKE ONE CAPSULE AT BEDTIME) 90 capsule 1  . traMADol (ULTRAM-ER) 300 MG 24 hr tablet TAKE 1 TABLET EVERY DAY 30 tablet 5    No current facility-administered medications on file prior to visit.    Review of Systems  Constitutional: Negative for other unusual diaphoresis or sweats HENT: Negative for ear discharge or swelling Eyes: Negative for other worsening visual disturbances Respiratory: Negative for stridor or other swelling  Gastrointestinal: Negative for worsening distension or other blood Genitourinary: Negative for retention or other urinary change Musculoskeletal: Negative for other MSK pain or swelling Skin: Negative for color change or other new lesions Neurological: Negative for worsening tremors and other numbness  Psychiatric/Behavioral: Negative for worsening agitation or other fatigue All other system neg per pt    Objective:   Physical Exam BP 126/78   Pulse 68   Ht 5\' 6"  (1.676 m)   Wt 187 lb (84.8 kg)   SpO2 99%   BMI 30.18 kg/m  VS  noted,  Constitutional: Pt appears in NAD HENT: Head: NCAT.  Right Ear: External ear normal.  Left Ear: External ear normal.  Eyes: . Pupils are equal, round, and reactive to light. Conjunctivae and EOM are normal Nose: without d/c or deformity Neck: Neck supple. Gross normal ROM Cardiovascular: Normal rate and regular rhythm.   Pulmonary/Chest: Effort normal and breath sounds without rales or wheezing.  Abd:  Soft, NT, ND, + BS, no organomegaly Left groin NT without swelling or mass, left calf NT, neg homans Left knee with trace effusion, bony degenerative change, reduced ROM, NT Neurological: Pt is alert. At baseline orientation, motor grossly intact Skin: Skin is warm. No rashes, other new lesions, trace LLE edema to knee, 1+ bilat dorsalis pedis pulse Psychiatric: Pt behavior is normal without agitation  No other exam findings    Assessment & Plan:

## 2016-09-10 NOTE — Patient Instructions (Signed)
OK to stop the left knee brace  Please continue all other medications as before, and refills have been done if requested.  Please have the pharmacy call with any other refills you may need.  Please continue your efforts at being more active, low cholesterol diet, and weight control.  Please keep your appointments with your specialists as you may have planned  You will be contacted regarding the referral for: Dr Wynelle Link - orthopedic  You will be contacted regarding the referral for: Leg circulation test  Please go to the XRAY Department in the Basement (go straight as you get off the elevator) for the x-ray testing at your convenience  You will be contacted by phone if any changes need to be made immediately.  Otherwise, you will receive a letter about your results with an explanation, but please check with MyChart first.  Please remember to sign up for MyChart if you have not done so, as this will be important to you in the future with finding out test results, communicating by private email, and scheduling acute appointments online when needed.

## 2016-09-12 ENCOUNTER — Ambulatory Visit (INDEPENDENT_AMBULATORY_CARE_PROVIDER_SITE_OTHER)
Admission: RE | Admit: 2016-09-12 | Discharge: 2016-09-12 | Disposition: A | Discharge status: Home / Self Care | Payer: PPO | Source: Ambulatory Visit | Attending: Internal Medicine | Admitting: Internal Medicine

## 2016-09-12 DIAGNOSIS — M79605 Pain in left leg: Secondary | ICD-10-CM | POA: Diagnosis not present

## 2016-09-12 DIAGNOSIS — M79652 Pain in left thigh: Secondary | ICD-10-CM | POA: Diagnosis not present

## 2016-09-12 DIAGNOSIS — R0781 Pleurodynia: Secondary | ICD-10-CM

## 2016-09-12 DIAGNOSIS — R1032 Left lower quadrant pain: Secondary | ICD-10-CM | POA: Diagnosis not present

## 2016-09-12 DIAGNOSIS — S79912A Unspecified injury of left hip, initial encounter: Secondary | ICD-10-CM | POA: Diagnosis not present

## 2016-09-12 DIAGNOSIS — S299XXA Unspecified injury of thorax, initial encounter: Secondary | ICD-10-CM | POA: Diagnosis not present

## 2016-09-12 DIAGNOSIS — M25552 Pain in left hip: Secondary | ICD-10-CM | POA: Diagnosis not present

## 2016-09-12 DIAGNOSIS — S8992XA Unspecified injury of left lower leg, initial encounter: Secondary | ICD-10-CM | POA: Diagnosis not present

## 2016-09-13 DIAGNOSIS — R0781 Pleurodynia: Secondary | ICD-10-CM | POA: Insufficient documentation

## 2016-09-13 DIAGNOSIS — R1032 Left lower quadrant pain: Secondary | ICD-10-CM | POA: Insufficient documentation

## 2016-09-13 DIAGNOSIS — M79605 Pain in left leg: Secondary | ICD-10-CM | POA: Insufficient documentation

## 2016-09-13 NOTE — Assessment & Plan Note (Signed)
Etiology unclear, cant r/o hip DJD - for pelvic and hip films,  to f/u any worsening symptoms or concerns

## 2016-09-13 NOTE — Assessment & Plan Note (Signed)
Possibly worsening, for referral ortho, cont same tx

## 2016-09-13 NOTE — Assessment & Plan Note (Addendum)
Also for left femur film but doubt fx, and LE arterial dopplers r/o vascular insufficiency

## 2016-09-13 NOTE — Assessment & Plan Note (Signed)
Mild to mod persistent, for rib films r/o rib fx

## 2016-09-22 ENCOUNTER — Ambulatory Visit (HOSPITAL_COMMUNITY)
Admission: RE | Admit: 2016-09-22 | Discharge: 2016-09-22 | Disposition: A | Discharge status: Home / Self Care | Payer: PPO | Source: Ambulatory Visit | Attending: Internal Medicine | Admitting: Internal Medicine

## 2016-09-22 DIAGNOSIS — M79605 Pain in left leg: Secondary | ICD-10-CM

## 2016-09-22 DIAGNOSIS — I739 Peripheral vascular disease, unspecified: Secondary | ICD-10-CM | POA: Diagnosis not present

## 2016-09-22 NOTE — Progress Notes (Signed)
VASCULAR LAB PRELIMINARY  ARTERIAL  ABI completed: bilateral ABI within normal limits.    RIGHT    LEFT    PRESSURE WAVEFORM  PRESSURE WAVEFORM  BRACHIAL 142 Tri BRACHIAL 141 Tri         AT 173 Tri AT 163 Tri  PT 183 Tri PT 173 Tri                  RIGHT LEFT  ABI 1.29 1.22    Landry Mellow, RDMS, RVT  09/22/2016, 3:04 PM

## 2016-10-12 ENCOUNTER — Other Ambulatory Visit: Payer: Self-pay | Admitting: Internal Medicine

## 2016-10-13 ENCOUNTER — Other Ambulatory Visit: Payer: Self-pay

## 2016-10-13 NOTE — Patient Outreach (Signed)
[facility]) Care Management  [place]  10/13/2016   Scott [name] March 03, 1955 [id number]  Subjective: Telephone call to patient for every other month call.  Patient reports he is doing good.  No problems with atrial fibrillation or signs of heart failure.  Patient continues to work part time at the [employer]. Discussed with patient atrial fibrillation and heart failure and when to seek help.  He verbalized understanding.  Discussed with patient case closure next month and patient in agreement.    Objective:   Encounter Medications:  Outpatient Encounter Prescriptions as of 10/13/2016  Medication Sig Note  . acetaminophen (TYLENOL) 500 MG tablet Take 1,000 mg by mouth every 6 (six) hours as needed for mild pain.    Marland Kitchen albuterol (PROVENTIL HFA;VENTOLIN HFA) 108 (90 Base) MCG/ACT inhaler Inhale 2 puffs into the lungs every 6 (six) hours as needed for wheezing or shortness of breath.   . ALPRAZolam (XANAX) 0.5 MG tablet TAKE 1 TABLET BY MOUTH TWICE A DAY AS NEEDED FOR ANXIETY (Patient taking differently: TAKE 1 TABLET BY MOUTH TWICE A DAY)   . apixaban (ELIQUIS) 5 MG TABS tablet Take 1 tablet (5 mg total) by mouth 2 (two) times daily.   . budesonide-formoterol (SYMBICORT) 160-4.5 MCG/ACT inhaler INHALE 2 PUFFS INTO THE LUNGS 2 (TWO) TIMES DAILY. (Patient taking differently: Inhale 2 puffs into the lungs 2 (two) times daily. )   . cholecalciferol (VITAMIN D) 1000 units tablet Take 1,000 Units by mouth daily.   Marland Kitchen docusate sodium (COLACE) 100 MG capsule Take 100 mg by mouth daily as needed for mild constipation.   . lovastatin (MEVACOR) 40 MG tablet TAKE 2 TABLETS (80 MG TOTAL) BY MOUTH EVERY EVENING.   . metoprolol succinate (TOPROL-XL) 50 MG 24 hr tablet Take 1 and 1/2 tab by mouth daily. Yearly physical due in march must see MD for refills (Patient taking differently: Take 75 mg by mouth daily. Take 1 and 1/2 tab by mouth daily.) 06/20/2016: 530am  . Multiple Vitamin  (MULTIVITAMIN WITH MINERALS) TABS tablet Take 1 tablet by mouth daily.   . Olmesartan-Amlodipine-HCTZ 40-5-12.5 MG TABS 1 by mouth every day (Patient taking differently: Take 1 tablet by mouth daily. )   . temazepam (RESTORIL) 30 MG capsule TAKE ONE CAPSULE AT BEDTIME AS NEEDED FOR SLEEP (Patient taking differently: TAKE ONE CAPSULE AT BEDTIME)   . traMADol (ULTRAM-ER) 300 MG 24 hr tablet TAKE 1 TABLET EVERY DAY    No facility-administered encounter medications on file as of 10/13/2016.     Functional Status:  In your present state of health, do you have any difficulty performing the following activities: 06/20/2016 06/20/2016  Hearing? - N  Vision? - N  Difficulty concentrating or making decisions? - N  Walking or climbing stairs? - N  Dressing or bathing? - N  Doing errands, shopping? N -  Preparing Food and eating ? - -  Using the Toilet? - -  In the past six months, have you accidently leaked urine? - -  Do you have problems with loss of bowel control? - -  Managing your Medications? - -  Managing your Finances? - -  Housekeeping or managing your Housekeeping? - -  Some recent data might be hidden    Fall/Depression Screening: Fall Risk  10/13/2016 08/18/2016 06/10/2016  Falls in the past year? Yes Yes No  Number falls in past yr: - 1 -  Injury with Fall? - - -  Risk Factor Category  - - -  Comment - - -  Risk for fall due to : - - -   PHQ 2/9 Scores 10/13/2016 08/18/2016 06/10/2016 05/15/2016 04/15/2016 03/21/2016 02/15/2016  PHQ - 2 Score 0 0 0 0 0 0 0    Assessment: Patient continues to benefit from health coach outreach for disease management and support.    Plan:  [facility] CM Care Plan Problem One     Most Recent Value  Care Plan Problem One  Knowledge deficit Atrial Fibrillation  Role Documenting the Problem One  [place] for Problem One  Active  [facility] Long Term Goal   Patient will report knowing signs and symptoms of acute atrial fibrillation and when to notify  physician within 90 days.  [facility] Long Term Goal Start Date  10/13/16 [goal continued]  Interventions for Problem One Long Term Goal  RN Health Coach reiterated with patient signs and symptoms of atrial fibrillation.  Also reviewed signs of heart failure with patient.      RN Health Coach will contact patient in the month of September and patient agrees to next outreach.  [name], RN, MSN [place] [phone]

## 2016-10-14 NOTE — Telephone Encounter (Signed)
faxed

## 2016-10-14 NOTE — Telephone Encounter (Signed)
Done hardcopy to Shir[place]  

## 2016-10-22 ENCOUNTER — Telehealth: Payer: Self-pay | Admitting: Internal Medicine

## 2016-10-22 ENCOUNTER — Encounter: Payer: Self-pay | Admitting: *Deleted

## 2016-10-22 DIAGNOSIS — G8929 Other chronic pain: Secondary | ICD-10-CM | POA: Diagnosis not present

## 2016-10-22 DIAGNOSIS — M25561 Pain in right knee: Secondary | ICD-10-CM | POA: Diagnosis not present

## 2016-10-22 DIAGNOSIS — M25562 Pain in left knee: Secondary | ICD-10-CM | POA: Diagnosis not present

## 2016-10-22 NOTE — Telephone Encounter (Signed)
New message    Pt is calling stating that he needs a letter stating that he can have an MRI. He has a loop recorder. He would like to pick up the letter and it's for [place].

## 2016-10-22 NOTE — Telephone Encounter (Signed)
Informed patient that it would be safe to have an MRI. Letter created. Informed patient that it would be left at the front desk for him to pick up tomorrow. Patient verbalized understanding.

## 2016-10-29 DIAGNOSIS — M25562 Pain in left knee: Secondary | ICD-10-CM | POA: Diagnosis not present

## 2016-10-29 DIAGNOSIS — G8929 Other chronic pain: Secondary | ICD-10-CM | POA: Diagnosis not present

## 2016-11-11 ENCOUNTER — Other Ambulatory Visit: Payer: Self-pay

## 2016-11-11 NOTE — Patient Outreach (Signed)
[facility]) Care Management  [place]  11/11/2016   Scott [name] Dec 15, 1955 [id number]  Subjective: Telephone call to patient for case closure call. Patient reports he is doing well.  He has had no problems with his atrial fibrillation. Reiterated with patient signs of atrial fibrillation and seeking assistance.  He verbalized understanding.  Patient agreeable with case closure at this time. Objective:   Encounter Medications:  Outpatient Encounter Prescriptions as of 11/11/2016  Medication Sig Note  . acetaminophen (TYLENOL) 500 MG tablet Take 1,000 mg by mouth every 6 (six) hours as needed for mild pain.    Marland Kitchen albuterol (PROVENTIL HFA;VENTOLIN HFA) 108 (90 Base) MCG/ACT inhaler Inhale 2 puffs into the lungs every 6 (six) hours as needed for wheezing or shortness of breath.   . ALPRAZolam (XANAX) 0.5 MG tablet TAKE 1 TABLET BY MOUTH TWICE A DAY AS NEEDED FOR ANXIETY (Patient taking differently: TAKE 1 TABLET BY MOUTH TWICE A DAY)   . apixaban (ELIQUIS) 5 MG TABS tablet Take 1 tablet (5 mg total) by mouth 2 (two) times daily.   . budesonide-formoterol (SYMBICORT) 160-4.5 MCG/ACT inhaler INHALE 2 PUFFS INTO THE LUNGS 2 (TWO) TIMES DAILY. (Patient taking differently: Inhale 2 puffs into the lungs 2 (two) times daily. )   . cholecalciferol (VITAMIN D) 1000 units tablet Take 1,000 Units by mouth daily.   Marland Kitchen docusate sodium (COLACE) 100 MG capsule Take 100 mg by mouth daily as needed for mild constipation.   . lovastatin (MEVACOR) 40 MG tablet TAKE 2 TABLETS (80 MG TOTAL) BY MOUTH EVERY EVENING.   . metoprolol succinate (TOPROL-XL) 50 MG 24 hr tablet Take 1 and 1/2 tab by mouth daily. Yearly physical due in march must see MD for refills (Patient taking differently: Take 75 mg by mouth daily. Take 1 and 1/2 tab by mouth daily.) 06/20/2016: 530am  . Multiple Vitamin (MULTIVITAMIN WITH MINERALS) TABS tablet Take 1 tablet by mouth daily.   . Olmesartan-Amlodipine-HCTZ  40-5-12.5 MG TABS 1 by mouth every day (Patient taking differently: Take 1 tablet by mouth daily. )   . temazepam (RESTORIL) 30 MG capsule TAKE 1 CAPSULE BY MOUTH AT BEDTIME AS NEEDED FOR SLEEP   . traMADol (ULTRAM-ER) 300 MG 24 hr tablet TAKE 1 TABLET EVERY DAY    No facility-administered encounter medications on file as of 11/11/2016.     Functional Status:  In your present state of health, do you have any difficulty performing the following activities: 06/20/2016 06/20/2016  Hearing? - N  Vision? - N  Difficulty concentrating or making decisions? - N  Walking or climbing stairs? - N  Dressing or bathing? - N  Doing errands, shopping? N -  Preparing Food and eating ? - -  Using the Toilet? - -  In the past six months, have you accidently leaked urine? - -  Do you have problems with loss of bowel control? - -  Managing your Medications? - -  Managing your Finances? - -  Housekeeping or managing your Housekeeping? - -  Some recent data might be hidden    Fall/Depression Screening: Fall Risk  11/11/2016 10/13/2016 08/18/2016  Falls in the past year? Yes Yes Yes  Number falls in past yr: - - 1  Injury with Fall? - - -  Risk Factor Category  - - -  Comment - - -  Risk for fall due to : - - -   PHQ 2/9 Scores 11/11/2016 10/13/2016 08/18/2016 06/10/2016 05/15/2016 04/15/2016  03/21/2016  PHQ - 2 Score 0 0 0 0 0 0 0    Assessment: Patient has met goals of care and is ready for closure  Plan:  [facility] CM Care Plan Problem One     Most Recent Value  Care Plan Problem One  Knowledge deficit Atrial Fibrillation  Role Documenting the Problem One  [place] for Problem One  Active  [facility] Long Term Goal   Patient will report knowing signs and symptoms of acute atrial fibrillation and when to notify physician within 90 days.  [facility] Long Term Goal Start Date  10/13/16  [facility] Long Term Goal Met Date  11/11/16  Interventions for Problem One Long Term Goal  patient aware of signs of atrial  fibrillation.     RN Health coach will close case, notify physician, and care management assistant.  [name], RN, MSN [place] [phone]

## 2016-11-12 ENCOUNTER — Other Ambulatory Visit: Payer: Self-pay | Admitting: Internal Medicine

## 2016-11-14 DIAGNOSIS — M25561 Pain in right knee: Secondary | ICD-10-CM | POA: Diagnosis not present

## 2016-11-14 DIAGNOSIS — G8929 Other chronic pain: Secondary | ICD-10-CM | POA: Diagnosis not present

## 2016-11-14 DIAGNOSIS — M25562 Pain in left knee: Secondary | ICD-10-CM | POA: Diagnosis not present

## 2016-11-21 DIAGNOSIS — G8929 Other chronic pain: Secondary | ICD-10-CM | POA: Diagnosis not present

## 2016-11-21 DIAGNOSIS — M25562 Pain in left knee: Secondary | ICD-10-CM | POA: Diagnosis not present

## 2016-11-21 DIAGNOSIS — M25561 Pain in right knee: Secondary | ICD-10-CM | POA: Diagnosis not present

## 2016-12-11 DIAGNOSIS — M25562 Pain in left knee: Secondary | ICD-10-CM | POA: Diagnosis not present

## 2016-12-11 DIAGNOSIS — M25561 Pain in right knee: Secondary | ICD-10-CM | POA: Diagnosis not present

## 2016-12-11 DIAGNOSIS — G8929 Other chronic pain: Secondary | ICD-10-CM | POA: Diagnosis not present

## 2016-12-11 DIAGNOSIS — M2241 Chondromalacia patellae, right knee: Secondary | ICD-10-CM | POA: Diagnosis not present

## 2016-12-27 ENCOUNTER — Other Ambulatory Visit: Payer: Self-pay | Admitting: Internal Medicine

## 2016-12-29 NOTE — Telephone Encounter (Signed)
Done hardcopy to Shir[place]  

## 2016-12-30 NOTE — Telephone Encounter (Signed)
Faxed

## 2017-01-16 ENCOUNTER — Other Ambulatory Visit: Payer: Self-pay | Admitting: Internal Medicine

## 2017-02-20 ENCOUNTER — Ambulatory Visit: Payer: Self-pay

## 2017-02-20 NOTE — Telephone Encounter (Signed)
Pt. Called to report he has been having more shortness of breath recently.  Described breathing as "difficult" at times.  Reported that due to his COPD, the breathing worsens with activity and with weather changes.  Stated that his shortness of breath is severe at times.  Reported he has been dealing with this for awhile, but it has worsened within the past 6-8 weeks.  During triage questions for Shortness of breath, the pt. admitted to having increased episodes of chest pain.  Reported the chest pain is located mid-chest, on left side of the sternum; rated at 8/10 when he exerts.  Stated he gets some left arm discomfort at times.  Reported he has dizziness, sweating, and shortness of breath with the chest pain.  Stated the chest pain can be 8/10 at times. Admitted to having chest pain during the call, but that it subsided after the call.  BP 138/89 and P.103 at this time, per digital blood pressure machine.  Per protocol, advised pt. He should go to the ER.  Advised that his chest pain and shortness of breath may be related to blockage or narrowing of one of the blood vessels in his heart. Encouraged to have someone drive him to the ER.  Verb. Understanding;  Agreed with plan.     Prior to ending call, the pt. req. an appt. with Dr. [name] next week, on Friday, for right ear pain; scheduled for 02/27/17. Pt. Agreed.        Reason for Disposition . [1] Intermittent  chest pain or "angina" AND [2] increasing in severity or frequency  (Exception: pains lasting a few seconds)  Answer Assessment - Initial Assessment Questions 1. RESPIRATORY STATUS: "Describe your breathing?" (e.g., wheezing, shortness of breath, unable to speak, severe coughing)      Difficult; just walked out to bring my trash cans back in, and up a little incline  2. ONSET: "When did this breathing problem begin?"      Breathing with more effort   3. PATTERN "Does the difficult breathing come and go, or has it been constant since it started?"       Intermittent and depends on the weather 4. SEVERITY: "How bad is your breathing?" (e.g., mild, moderate, severe)    - MILD: No SOB at rest, mild SOB with walking, speaks normally in sentences, can lay down, no retractions, pulse < 100.    - MODERATE: SOB at rest, SOB with minimal exertion and prefers to sit, cannot lie down flat, speaks in phrases, mild retractions, audible wheezing, pulse 100-120.    - SEVERE: Very SOB at rest, speaks in single words, struggling to breathe, sitting hunched forward, retractions, pulse > 120      Severe;  BP 138/89, Pulse 103  5. RECURRENT SYMPTOM: "Have you had difficulty breathing before?" If so, ask: "When was the last time?" and "What happened that time?"      I have been dealing with this for awhile; it has worsened over past 6-8 weeks.  6. CARDIAC HISTORY: "Do you have any history of heart disease?" (e.g., heart attack, angina, bypass surgery, angioplasty)     Hx of Atrial Fib, CABG, [place] procedure   7. LUNG HISTORY: "Do you have any history of lung disease?"  (e.g., pulmonary embolus, asthma, emphysema)    Has COPD 8. CAUSE: "What do you think is causing the breathing problem?"      COPD 9. OTHER SYMPTOMS: "Do you have any other symptoms? (e.g., dizziness, runny nose, cough,  chest pain, fever)     No upper resp. congestion; stated he gets chest discomfort on each side of sternum 1-2 x/ day with exertion; comes and goes; occurs with walking. Denies radiation of chest pain.   10. PREGNANCY: "Is there any chance you are pregnant?" "When was your last menstrual period?"       n/a 11. TRAVEL: "Have you traveled out of the country in the last month?" (e.g., travel history, exposures)       no  Answer Assessment - Initial Assessment Questions 1. LOCATION: "Where does it hurt?"      Middle of chest on left of sternum 2. RADIATION: "Does the pain go anywhere else?" (e.g., into neck, jaw, arms, back)     Does not radiate 3. ONSET: "When did the chest  pain begin?" (Minutes, hours or days)      Chest pain occurring more freq. Over past 6 weeks. 4. PATTERN "Does the pain come and go, or has it been constant since it started?"  "Does it get worse with exertion?"      Comes and goes and is worse with exertion 5. DURATION: "How long does it last" (e.g., seconds, minutes, hours)     About 30-60 min.   6. SEVERITY: "How bad is the pain?"  (e.g., Scale 1-10; mild, moderate, or severe)    - MILD (1-3): doesn't interfere with normal activities     - MODERATE (4-7): interferes with normal activities or awakens from sleep    - SEVERE (8-10): excruciating pain, unable to do any normal activities      With exertion feels it at 8/10 7. CARDIAC RISK FACTORS: "Do you have any history of heart problems or risk factors for heart disease?" (e.g., prior heart attack, angina; high blood pressure, diabetes, being overweight, high cholesterol, smoking, or strong family history of heart disease)    CABG, Atrial Fib, [place] procedure 8. PULMONARY RISK FACTORS: "Do you have any history of lung disease?"  (e.g., blood clots in lung, asthma, emphysema, birth control pills)     COPD 9. CAUSE: "What do you think is causing the chest pain?"     unknown 10. OTHER SYMPTOMS: "Do you have any other symptoms?" (e.g., dizziness, nausea, vomiting, sweating, fever, difficulty breathing, cough)      Some dizziness, sweating, shortness of breath  11. PREGNANCY: "Is there any chance you are pregnant?" "When was your last menstrual period?"       n/a  Protocols used: CHEST PAIN-A-AH, BREATHING DIFFICULTY-A-AH

## 2017-02-27 ENCOUNTER — Other Ambulatory Visit (INDEPENDENT_AMBULATORY_CARE_PROVIDER_SITE_OTHER): Payer: PPO

## 2017-02-27 ENCOUNTER — Ambulatory Visit (INDEPENDENT_AMBULATORY_CARE_PROVIDER_SITE_OTHER): Payer: PPO | Admitting: Internal Medicine

## 2017-02-27 ENCOUNTER — Encounter: Payer: Self-pay | Admitting: Internal Medicine

## 2017-02-27 ENCOUNTER — Ambulatory Visit (INDEPENDENT_AMBULATORY_CARE_PROVIDER_SITE_OTHER)
Admission: RE | Admit: 2017-02-27 | Discharge: 2017-02-27 | Disposition: A | Discharge status: Home / Self Care | Payer: PPO | Source: Ambulatory Visit | Attending: Internal Medicine | Admitting: Internal Medicine

## 2017-02-27 VITALS — BP 126/78 | HR 61 | Temp 97.6°F | Ht 66.0 in | Wt 197.0 lb

## 2017-02-27 DIAGNOSIS — Z1159 Encounter for screening for other viral diseases: Secondary | ICD-10-CM

## 2017-02-27 DIAGNOSIS — R079 Chest pain, unspecified: Secondary | ICD-10-CM

## 2017-02-27 DIAGNOSIS — H9201 Otalgia, right ear: Secondary | ICD-10-CM | POA: Insufficient documentation

## 2017-02-27 DIAGNOSIS — Z114 Encounter for screening for human immunodeficiency virus [HIV]: Secondary | ICD-10-CM

## 2017-02-27 DIAGNOSIS — J449 Chronic obstructive pulmonary disease, unspecified: Secondary | ICD-10-CM

## 2017-02-27 DIAGNOSIS — Z0000 Encounter for general adult medical examination without abnormal findings: Secondary | ICD-10-CM | POA: Diagnosis not present

## 2017-02-27 DIAGNOSIS — I10 Essential (primary) hypertension: Secondary | ICD-10-CM | POA: Diagnosis not present

## 2017-02-27 DIAGNOSIS — R739 Hyperglycemia, unspecified: Secondary | ICD-10-CM | POA: Diagnosis not present

## 2017-02-27 LAB — BASIC METABOLIC PANEL
BUN: 18 mg/dL (ref 6–23)
CALCIUM: 9.2 mg/dL (ref 8.4–10.5)
CO2: 32 mEq/L (ref 19–32)
Chloride: 104 mEq/L (ref 96–112)
Creatinine, Ser: 1.2 mg/dL (ref 0.40–1.50)
GFR: 65.23 mL/min (ref >60.00)
GLUCOSE: 115 mg/dL — AB (ref 70–99)
POTASSIUM: 4.1 meq/L (ref 3.5–5.1)
SODIUM: 142 meq/L (ref 135–145)

## 2017-02-27 LAB — CBC WITH DIFFERENTIAL/PLATELET
BASOS PCT: 0.7 % (ref 0.0–3.0)
Basophils Absolute: 0 10*3/uL (ref 0.0–0.1)
EOS PCT: 6.1 % — AB (ref 0.0–5.0)
Eosinophils Absolute: 0.4 10*3/uL (ref 0.0–0.7)
HCT: 41 % (ref 39.0–52.0)
HEMOGLOBIN: 13.6 g/dL (ref 13.0–17.0)
LYMPHS ABS: 1.1 10*3/uL (ref 0.7–4.0)
Lymphocytes Relative: 16.2 % (ref 12.0–46.0)
MCHC: 33.1 g/dL (ref 30.0–36.0)
MCV: 90.8 fl (ref 78.0–100.0)
MONO ABS: 0.7 10*3/uL (ref 0.1–1.0)
MONOS PCT: 10.3 % (ref 3.0–12.0)
NEUTROS PCT: 66.7 % (ref 43.0–77.0)
Neutro Abs: 4.4 10*3/uL (ref 1.4–7.7)
Platelets: 185 10*3/uL (ref 150.0–400.0)
RBC: 4.51 Mil/uL (ref 4.22–5.81)
RDW: 14.4 % (ref 11.5–15.5)
WBC: 6.6 10*3/uL (ref 4.0–10.5)

## 2017-02-27 LAB — HEPATIC FUNCTION PANEL
ALBUMIN: 4 g/dL (ref 3.5–5.2)
ALT: 19 U/L (ref 0–53)
AST: 22 U/L (ref 0–37)
Alkaline Phosphatase: 59 U/L (ref 39–117)
BILIRUBIN TOTAL: 0.4 mg/dL (ref 0.2–1.2)
Bilirubin, Direct: 0.1 mg/dL (ref 0.0–0.3)
Total Protein: 7 g/dL (ref 6.0–8.3)

## 2017-02-27 LAB — LIPID PANEL
Cholesterol: 142 mg/dL (ref 0–200)
HDL: 52.6 mg/dL (ref >39.00)
LDL Cholesterol: 77 mg/dL (ref 0–99)
NONHDL: 89.33
Total CHOL/HDL Ratio: 3
Triglycerides: 64 mg/dL (ref 0.0–149.0)
VLDL: 12.8 mg/dL (ref 0.0–40.0)

## 2017-02-27 LAB — HEMOGLOBIN A1C: Hgb A1c MFr Bld: 5.6 % (ref 4.6–6.5)

## 2017-02-27 LAB — TROPONIN I: TNIDX: 0.01 ug/l (ref 0.00–0.06)

## 2017-02-27 MED ORDER — ALBUTEROL SULFATE HFA 108 (90 BASE) MCG/ACT IN AERS: 2.0000 | INHALATION_SPRAY | Freq: Four times a day (QID) | RESPIRATORY_TRACT | 11 refills | Status: DC | PRN

## 2017-02-27 NOTE — Addendum Note (Signed)
Addended by: Juliet Rude on: 02/27/2017 09:58 AM   Modules accepted: Orders

## 2017-02-27 NOTE — Patient Instructions (Addendum)
Your EKG was OK today  Please take OTC Zyrtec, and Nasacort for allergies, as well as OTC Mucinex as needed for the ears congestion  Please continue all other medications as before, and refills have been done if requested.  Please have the pharmacy call with any other refills you may need.  Please continue your efforts at being more active, low cholesterol diet, and weight control.  Please keep your appointments with your specialists as you may have planned  You will be contacted regarding the referral for: colonoscopy, as well as Cardiology and Stress test  Please go to the XRAY Department in the Basement (go straight as you get off the elevator) for the x-ray testing  Please go to the LAB in the Basement (turn left off the elevator) for the tests to be done today  You will be contacted by phone if any changes need to be made immediately.  Otherwise, you will receive a letter about your results with an explanation, but please check with MyChart first.  Please remember to sign up for MyChart if you have not done so, as this will be important to you in the future with finding out test results, communicating by private email, and scheduling acute appointments online when needed.  Please return in 6 months, or sooner if needed, with Lab testing done 3-5 days before

## 2017-02-27 NOTE — Assessment & Plan Note (Addendum)
Atypical, I suspect MSK but cant r/o other, for ECG today, stress test order but also refer cardiology ; also for cxr and routine labs  Note:  Total time for pt hx, exam, review of record with pt in the room, determination of diagnoses and plan for further eval and tx is > 40 min, with over 50% spent in coordination and counseling of patient including the differential dx, tx, further evaluation and other management of chest pain, right ear pain, and HTN

## 2017-02-27 NOTE — Assessment & Plan Note (Signed)
stable overall by history and exam, recent data reviewed with pt, and pt to continue medical treatment as before,  to f/u any worsening symptoms or concerns  

## 2017-02-27 NOTE — Progress Notes (Addendum)
Subjective:    Patient ID: Scott [name], male    DOB: 06/25/1955, 62 y.o.   MRN: [id number]  HPI  Here to f/u -   C/o right ear pain ? Swimmers ear like he had years ago, seems to come and go pain and no drainage, no fever, no sinus congestion or pain.  Does have allergies, not sure if allergies are worse recently.    Also with episode of Chest pain low sternal without radiation but did have some arm feeling funny, kind of like he had before with LUE neuritic pain but also different, assoc with sob, diaphoresis, heart racing but no nausea, vomiting, and exertional and pleuritic as it occurred with walking back up the somewhat steep incline to the house rolling 2 trash cans back to the house.  This was unusual exertion, though does walk about 4 miles per day at regular pace.  Has also had some intermittent funny feelings in the chest since then as well, very short lived.  First episode lasted 45 min , and by time he called for nurse it was resolved and did not go to ED as recommended.  Has hx of afib, and felt that might have been worse at that time as well.  Still taking eliquis, good compliance with med.  He tends to downplay his symptoms and did not want to "waste time and money going to the ED".  Sees Dr Francis Gaines, s/p maze procedure and 1 vessel CABG 2013.   No fever, ST, cough,  Currently having pain at this time with a tenderness to palpation only, not exertional this am Past Medical History:  Diagnosis Date  . ALLERGIC RHINITIS 01/14/2007  . ANXIETY 09/28/2006  . Asbestos exposure   . Atrial fibrillation ([place]) 09/28/2006   s/p afib ablation x 2  . CHF (congestive heart failure) ([place])   . COPD (chronic obstructive pulmonary disease) ([place])   . Coronary artery disease 11/24/2011  . Diastolic dysfunction 07/25/4429  . Dyspnea    W/ EXERTION   . Dysrhythmia    AFIB     . Gallstones   . HYPERLIPIDEMIA 09/28/2006  . HYPERTENSION 09/28/2006  . HYPOTHYROIDISM, ACQUIRED NEC 09/28/2006   pt denies  and doesn't know  . Long term current use of anticoagulant 04/08/2010  . OBSTRUCTIVE SLEEP APNEA 01/06/2008   NOT USING    . Right knee DJD 07/09/2010  . S/P CABG x 1 11/26/2011   Right internal mammary artery to right coronary artery  . S/P Maze operation for atrial fibrillation 11/26/2011   complete biatrial lesion set with clipping of LA appendage   Past Surgical History:  Procedure Laterality Date  . ANTERIOR CERVICAL DECOMP/DISCECTOMY FUSION N/A 06/20/2016   Procedure: Anterior Cervical Discectomy Fusion - Cervical five-Cervical six - Cervical six-Cervical seven - Cervicla seven- Thoracic one;  Surgeon: Earnie Larsson, MD;  Location: [place];  Service: Neurosurgery;  Laterality: N/A;  . CARDIOVERSION     X4   . CERVICAL FUSION  06/20/2016   C5-6, C6-7, C7-T1 anterior cervical discectomy with interbody fusion utilizing interbody peek cages, locally harvested autograft, and anterior plate instrumentation  . CORONARY ARTERY BYPASS GRAFT  11/26/2011   Procedure: CORONARY ARTERY BYPASS GRAFTING (CABG);  Surgeon: Rexene Alberts, MD;  Location: [place];  Service: Open Heart Surgery;  Laterality: N/A;  coronary artery bypass on pump times one utilizing the right internal mammary artery, transesophageal echocardiogram   . hand surgury Left   . LEFT HEART CATHETERIZATION WITH  CORONARY ANGIOGRAM N/A 11/24/2011   Procedure: LEFT HEART CATHETERIZATION WITH CORONARY ANGIOGRAM;  Surgeon: Peter M Martinique, MD;  Location: [facility] CATH LAB;  Service: Cardiovascular;  Laterality: N/A;  . LOOP RECORDER IMPLANT N/A 07/06/2012   Procedure: LOOP RECORDER IMPLANT;  Surgeon: Thompson Grayer, MD;  Location: [facility] CATH LAB;  Service: Cardiovascular;  Laterality: N/A;  . MAZE  11/26/2011   Procedure: MAZE;  Surgeon: Rexene Alberts, MD;  Location: [place];  Service: Open Heart Surgery;  Laterality: N/A;  Cryomaze   . Radiofrequency Ablation for atrial fibrillation  12/30/07 and 06/05/09   afib ablation x 2 by JA  . Radiofrequency ablation for  atrial flutter  2005   [facility]    reports that he quit smoking about 21 years ago. His smoking use included cigarettes. He has a 39.00 pack-year smoking history. he has never used smokeless tobacco. He reports that he does not drink alcohol or use drugs. family history includes Atrial fibrillation in his mother; Cancer in his paternal grandmother; Dementia in his father and paternal grandfather; Hypertension in his father; Stroke in his mother. Allergies  Allergen Reactions  . Zolpidem Tartrate Other (See Comments)    Extremely drowsy the next day  Medication Sig Dispense Refill  . acetaminophen (TYLENOL) 500 MG tablet Take 1,000 mg by mouth every 6 (six) hours as needed for mild pain.     Marland Kitchen albuterol (PROVENTIL HFA;VENTOLIN HFA) 108 (90 Base) MCG/ACT inhaler Inhale 2 puffs into the lungs every 6 (six) hours as needed for wheezing or shortness of breath. 1 Inhaler 11  . ALPRAZolam (XANAX) 0.5 MG tablet TAKE 1 TABLET BY MOUTH TWICE A DAY AS NEEDED FOR ANXIETY 180 tablet 1  . apixaban (ELIQUIS) 5 MG TABS tablet Take 1 tablet (5 mg total) by mouth 2 (two) times daily. 60 tablet 5  . budesonide-formoterol (SYMBICORT) 160-4.5 MCG/ACT inhaler INHALE 2 PUFFS INTO THE LUNGS 2 (TWO) TIMES DAILY. (Patient taking differently: Inhale 2 puffs into the lungs 2 (two) times daily. ) 30.6 Inhaler 11  . cholecalciferol (VITAMIN D) 1000 units tablet Take 1,000 Units by mouth daily.    Marland Kitchen docusate sodium (COLACE) 100 MG capsule Take 100 mg by mouth daily as needed for mild constipation.    . lovastatin (MEVACOR) 40 MG tablet TAKE 2 TABLETS (80 MG TOTAL) BY MOUTH EVERY EVENING. 180 tablet 1  . metoprolol succinate (TOPROL-XL) 50 MG 24 hr tablet Take 1 and 1/2 tab by mouth daily. Yearly physical due in march must see MD for refills (Patient taking differently: Take 75 mg by mouth daily. Take 1 and 1/2 tab by mouth daily.) 135 tablet 3  . Multiple Vitamin (MULTIVITAMIN WITH MINERALS) TABS tablet Take 1 tablet by mouth daily.     . Olmesartan-Amlodipine-HCTZ 40-5-12.5 MG TABS 1 by mouth every day (Patient taking differently: Take 1 tablet by mouth daily. ) 90 tablet 3  . temazepam (RESTORIL) 30 MG capsule TAKE 1 CAPSULE BY MOUTH AT BEDTIME AS NEEDED FOR SLEEP 90 capsule 1  . traMADol (ULTRAM-ER) 300 MG 24 hr tablet TAKE 1 TABLET BY MOUTH EVERY DAY 30 tablet 5   No current facility-administered medications on file prior to visit.     Review of Systems Constitutional: Negative for other unusual diaphoresis, sweats, appetite or weight changes HENT: Negative for other worsening hearing loss, ear pain, facial swelling, mouth sores or neck stiffness.   Eyes: Negative for other worsening pain, redness or other visual disturbance.  Respiratory: Negative for other stridor  or swelling Cardiovascular: Negative for other palpitations or other chest pain  Gastrointestinal: Negative for worsening diarrhea or loose stools, blood in stool, distention or other pain Genitourinary: Negative for hematuria, flank pain or other change in urine volume.  Musculoskeletal: Negative for myalgias or other joint swelling.  Skin: Negative for other color change, or other wound or worsening drainage.  Neurological: Negative for other syncope or numbness. Hematological: Negative for other adenopathy or swelling Psychiatric/Behavioral: Negative for hallucinations, other worsening agitation, SI, self-injury, or new decreased concentration All other system neg per pt    Objective:   Physical Exam BP 126/78   Pulse 61   Temp 97.6 F (36.4 C) (Oral)   Ht 5\' 6"  (1.676 m)   Wt 197 lb (89.4 kg)   SpO2 98%   BMI 31.80 kg/m  VS noted, not ill appearing Constitutional: Pt appears in NAD HENT: Head: NCAT.  Right Ear: External ear normal.  Left Ear: External ear normal.  Eyes: . Pupils are equal, round, and reactive to light. Conjunctivae and EOM are normal Nose: without d/c or deformity Bilat tm's with mild erythema left > right with small  effusions,.  Max sinus areas non tender.  Pharynx with mild erythema, no exudate Neck: Neck supple. Gross normal ROM ++ left chest tender to lower sternal and left low parasternal as well Cardiovascular: Normal rate and regular rhythm.   Pulmonary/Chest: Effort normal and breath sounds without rales or wheezing.  Abd:  Soft, NT, ND, + BS, no organomegaly Neurological: Pt is alert. At baseline orientation, motor grossly intact Skin: Skin is warm. No rashes, other new lesions, no LE edema Psychiatric: Pt behavior is normal without agitation , mild nervous No other exam findings  ECG today I have personally interpreted: Sinus bradycardia 54 with PAC o/w no acute ST or T wave changes      Assessment & Plan:

## 2017-02-27 NOTE — Assessment & Plan Note (Signed)
Exam c/w right eustachain dysfxn likely related to ongoing allergies; for zyrtec, nasacort asd, and mucinex prn

## 2017-02-28 LAB — HEPATITIS C ANTIBODY
HEP C AB: NONREACTIVE
SIGNAL TO CUT-OFF: 0.02 (ref <1.00)

## 2017-02-28 LAB — HIV ANTIBODY (ROUTINE TESTING W REFLEX): HIV 1&2 Ab, 4th Generation: NONREACTIVE

## 2017-03-03 ENCOUNTER — Telehealth (HOSPITAL_COMMUNITY): Payer: Self-pay | Admitting: *Deleted

## 2017-03-03 NOTE — Telephone Encounter (Signed)
Patient given detailed instructions per Myocardial Perfusion Study Information Sheet for the test on 03/06/17 at [place]. Patient notified to arrive 15 minutes early and that it is imperative to arrive on time for appointment to keep from having the test rescheduled.  If you need to cancel or reschedule your appointment, please call the office within 24 hours of your appointment. . Patient verbalized understanding.Scott [name], [place]

## 2017-03-06 ENCOUNTER — Ambulatory Visit (HOSPITAL_COMMUNITY): Payer: PPO | Attending: Cardiology

## 2017-03-06 DIAGNOSIS — R079 Chest pain, unspecified: Secondary | ICD-10-CM | POA: Insufficient documentation

## 2017-03-06 LAB — MYOCARDIAL PERFUSION IMAGING
CHL CUP NUCLEAR SSS: 4
CSEPPHR: 129 {beats}/min
LV dias vol: 146 mL (ref 62–150)
LV sys vol: 77 mL
RATE: 0.35
Rest HR: 64 {beats}/min
SDS: 2
SRS: 2
TID: 0.89

## 2017-03-06 MED ORDER — TECHNETIUM TC 99M TETROFOSMIN IV KIT
33.0000 | PACK | Freq: Once | INTRAVENOUS | Status: AC | PRN
  Administered 2017-03-06: 33 via INTRAVENOUS
  Filled 2017-03-06: qty 33

## 2017-03-06 MED ORDER — TECHNETIUM TC 99M TETROFOSMIN IV KIT
10.1000 | PACK | Freq: Once | INTRAVENOUS | Status: AC | PRN
  Administered 2017-03-06: 10.1 via INTRAVENOUS
  Filled 2017-03-06: qty 11

## 2017-03-06 MED ORDER — REGADENOSON 0.4 MG/5ML IV SOLN
0.4000 mg | Freq: Once | INTRAVENOUS | Status: AC
  Administered 2017-03-06: 0.4 mg via INTRAVENOUS

## 2017-03-10 ENCOUNTER — Telehealth: Payer: Self-pay | Admitting: Internal Medicine

## 2017-03-10 NOTE — Telephone Encounter (Signed)
New Message  Pt call requesting to speak with Rn about getting his results from his myo

## 2017-03-10 NOTE — Telephone Encounter (Signed)
Returned message to Pt via [place].  Stress test ordered by Dr. Cathlean Cower on [address].  Informed Pt to call office of Cathlean Cower for results of his stress test.  Pt not scheduled with provider in this office at this time.

## 2017-03-20 ENCOUNTER — Telehealth: Payer: Self-pay | Admitting: Internal Medicine

## 2017-03-20 NOTE — Telephone Encounter (Signed)
Copied from [place]. Topic: Quick Communication - See Telephone Encounter >> Mar 20, 2017  2:30 PM Vernona Rieger wrote: CRM for notification. See Telephone encounter for:   03/20/17.  Patient is requesting his results from 2 weeks ago from his stress test. He was told that he would be called and did not receive the call. Please call back @ [phone]

## 2017-03-23 NOTE — Telephone Encounter (Signed)
Message was left on mychart since he is signed up on jan 11  Stress test did not show any evidence for lack of blood flow to the heart - ok to let pt know, no further eval or tx is needed

## 2017-03-23 NOTE — Telephone Encounter (Signed)
Ok to let him know he should be helped to stop the Mychart with office administration, as I have no control over this and will always send results that way if it appears he is signed up for this, thanks

## 2017-03-23 NOTE — Telephone Encounter (Signed)
I have deactivated patients my Chart.

## 2017-03-23 NOTE — Telephone Encounter (Signed)
Pt has been informed and expressed understanding.   He said that he does not want anything released on MyChart.

## 2017-03-24 ENCOUNTER — Other Ambulatory Visit: Payer: Self-pay | Admitting: Internal Medicine

## 2017-03-27 ENCOUNTER — Other Ambulatory Visit: Payer: Self-pay | Admitting: Internal Medicine

## 2017-04-07 ENCOUNTER — Encounter: Payer: Self-pay | Admitting: Internal Medicine

## 2017-04-07 ENCOUNTER — Ambulatory Visit (INDEPENDENT_AMBULATORY_CARE_PROVIDER_SITE_OTHER): Payer: PPO | Admitting: Internal Medicine

## 2017-04-07 ENCOUNTER — Encounter (INDEPENDENT_AMBULATORY_CARE_PROVIDER_SITE_OTHER): Payer: Self-pay

## 2017-04-07 ENCOUNTER — Telehealth: Payer: Self-pay | Admitting: Internal Medicine

## 2017-04-07 VITALS — BP 130/78 | HR 76 | Ht 66.0 in | Wt 200.4 lb

## 2017-04-07 DIAGNOSIS — Z1211 Encounter for screening for malignant neoplasm of colon: Secondary | ICD-10-CM | POA: Diagnosis not present

## 2017-04-07 DIAGNOSIS — Z7901 Long term (current) use of anticoagulants: Secondary | ICD-10-CM | POA: Diagnosis not present

## 2017-04-07 DIAGNOSIS — I480 Paroxysmal atrial fibrillation: Secondary | ICD-10-CM

## 2017-04-07 MED ORDER — TRAMADOL HCL 50 MG PO TABS: 50.0000 mg | ORAL_TABLET | Freq: Four times a day (QID) | ORAL | 2 refills | Status: DC | PRN

## 2017-04-07 NOTE — Telephone Encounter (Signed)
Patient states he got a letter from [employer] [facility] stating they will no longer cover tramadol 300mg  (I have made copy and will place in Dr. Gwynn Burly box).  Patient states pharmacist told him to try 50mg  tabs to see if insurance would cover this.  Otherwise, patient is asking for something in place of tramadol.

## 2017-04-07 NOTE — Progress Notes (Signed)
HISTORY OF PRESENT ILLNESS:  Scott [name] is a 62 y.o. male , employee at the [employer], with multiple medical problems as listed below including but not limited to atrial fibrillation on chronic anticoagulation, congestive heart failure, COPD, hypertension, hyperlipidemia, sleep apnea, and coronary artery disease status post CABG. Patient sent today by his primary care provider Dr. [name] regarding screening colonoscopy. Patient has been encouraged on several occasions. He has been seen in this office by other providers in 2016 and again in early 2018. Each time he was to be set up for colonoscopy with his anticoagulants held. He never followed through. Patient tells me that he had concerns over procedure cost and cost of the prep. I asked him if he investigator the actual costs, he said no. He reports that his GI review of systems is negative. He denies that there are other laboratory abnormalities or concerns. No family history of colon cancer. He tells me that he has not been advised regarding other possible colon cancer screening strategies. He states that he is stable from a cardiopulmonary perspective. Reviewing the chart appears that is true. Previous ejection fraction 55-60%. Laboratory January 2019 unremarkable including CBC with a hemoglobin of 13.6 and comprehensive metabolic panel. Previous CT scan of the abdomen and pelvis 2013 revealed possible gallbladder sludge. Abdominal ultrasound 2015 suggests the same. Currently on Eliquis for his history of atrial fibrillation.  REVIEW OF SYSTEMS:  All non-GI ROS negative except for  Past Medical History:  Diagnosis Date  . ALLERGIC RHINITIS 01/14/2007  . ANXIETY 09/28/2006  . Asbestos exposure   . Atrial fibrillation ([place]) 09/28/2006   s/p afib ablation x 2  . CHF (congestive heart failure) ([place])   . COPD (chronic obstructive pulmonary disease) ([place])   . Coronary artery disease 11/24/2011  . Diastolic dysfunction 09/27/1515  . Dyspnea    W/  EXERTION   . Dysrhythmia    AFIB     . Gallstones   . HYPERLIPIDEMIA 09/28/2006  . HYPERTENSION 09/28/2006  . HYPOTHYROIDISM, ACQUIRED NEC 09/28/2006   pt denies and doesn't know  . Long term current use of anticoagulant 04/08/2010  . OBSTRUCTIVE SLEEP APNEA 01/06/2008   NOT USING    . Right knee DJD 07/09/2010  . S/P CABG x 1 11/26/2011   Right internal mammary artery to right coronary artery  . S/P Maze operation for atrial fibrillation 11/26/2011   complete biatrial lesion set with clipping of LA appendage    Past Surgical History:  Procedure Laterality Date  . ANTERIOR CERVICAL DECOMP/DISCECTOMY FUSION N/A 06/20/2016   Procedure: Anterior Cervical Discectomy Fusion - Cervical five-Cervical six - Cervical six-Cervical seven - Cervicla seven- Thoracic one;  Surgeon: Earnie Larsson, MD;  Location: [facility];  Service: Neurosurgery;  Laterality: N/A;  . CARDIOVERSION     X4   . CERVICAL FUSION  06/20/2016   C5-6, C6-7, C7-T1 anterior cervical discectomy with interbody fusion utilizing interbody peek cages, locally harvested autograft, and anterior plate instrumentation  . CORONARY ARTERY BYPASS GRAFT  11/26/2011   Procedure: CORONARY ARTERY BYPASS GRAFTING (CABG);  Surgeon: Rexene Alberts, MD;  Location: [facility];  Service: Open Heart Surgery;  Laterality: N/A;  coronary artery bypass on pump times one utilizing the right internal mammary artery, transesophageal echocardiogram   . hand surgury Left   . LEFT HEART CATHETERIZATION WITH CORONARY ANGIOGRAM N/A 11/24/2011   Procedure: LEFT HEART CATHETERIZATION WITH CORONARY ANGIOGRAM;  Surgeon: Peter M Martinique, MD;  Location: [facility] CATH LAB;  Service:  Cardiovascular;  Laterality: N/A;  . LOOP RECORDER IMPLANT N/A 07/06/2012   Procedure: LOOP RECORDER IMPLANT;  Surgeon: Thompson Grayer, MD;  Location: [facility] CATH LAB;  Service: Cardiovascular;  Laterality: N/A;  . MAZE  11/26/2011   Procedure: MAZE;  Surgeon: Rexene Alberts, MD;  Location: [place];  Service: Open Heart  Surgery;  Laterality: N/A;  Cryomaze   . Radiofrequency Ablation for atrial fibrillation  12/30/07 and 06/05/09   afib ablation x 2 by JA  . Radiofrequency ablation for atrial flutter  2005   CTI    Social History Scott [name]  reports that he quit smoking about 22 years ago. His smoking use included cigarettes. He has a 39.00 pack-year smoking history. he has never used smokeless tobacco. He reports that he does not drink alcohol or use drugs.  family history includes Atrial fibrillation in his mother; Cancer in his paternal grandmother; Dementia in his father and paternal grandfather; Hypertension in his father; Stroke in his mother.  Allergies  Allergen Reactions  . Zolpidem Tartrate Other (See Comments)    Extremely drowsy the next day       PHYSICAL EXAMINATION: Vital signs: BP 130/78   Pulse 76   Ht 5\' 6"  (1.676 m)   Wt 200 lb 6.4 oz (90.9 kg)   BMI 32.35 kg/m   Constitutional: generally well-appearing, no acute distress. Dense white Beard Psychiatric: alert and oriented x3, cooperative Eyes: extraocular movements intact, anicteric, conjunctiva pink Mouth: oral pharynx moist, no lesions Neck: supple no lymphadenopathy Cardiovascular: heart regular rate and rhythm, no murmur Lungs: clear to auscultation bilaterally Abdomen: soft, nontender, nondistended, no obvious ascites, no peritoneal signs, normal bowel sounds, no organomegaly Rectal: Omitted Extremities: no clubbing, cyanosis, or lower extremity edema bilaterally Skin: no lesions on visible extremities Neuro: No focal deficits. No asterixis.   ASSESSMENT:  #1. Colon cancer screening. Baseline risk for neoplasia. High-risk for optical colonoscopy #2. Multiple medical problems #3. Chronic anticoagulation therapy in the form of Eliquis for atrial fibrillation   PLAN:  #1. I discussed IN DETAIL with the patient the principal colon cancer screening strategies for asymptomatic baseline risk patients. These  include FIT stool testing, Cologuard DNA testing, and optical colonoscopy. I discussed with him the implications of positive and negative results for each strategy. I discussed with him Relative costs as well as risks and limitations. I also discussed this in context of his need for anticoagulation and temporary disruption with the approval of his cardiologist Dr. Lovena Le. I answered multiple questions. I also invited him to speak with our business office to discuss costs. He is highly informed and can decide if he wishes to proceed and if so with which strategy. He will contact this office if we can assist further. Otherwise he will return to the care of his primary care provider, Dr. [name].  I spent over 25 minutes with this patient. The majority of the time spent counseling regarding colon cancer screening strategies as I have outlined above detail

## 2017-04-07 NOTE — Telephone Encounter (Signed)
Tramadol refilled at 50 mg dosing -

## 2017-04-07 NOTE — Patient Instructions (Signed)
We will call you with the information you requested.

## 2017-04-17 ENCOUNTER — Telehealth: Payer: Self-pay | Admitting: Internal Medicine

## 2017-04-17 NOTE — Telephone Encounter (Signed)
Returned call to Pt.  Pt received call from this office, possibly related to appt made for April 24, 2017.  Pt states does not need this appt.  Cancelled March appt. Pt needs appt in July, no appt's available yet.  Pt asking about results of myoview, low risk.  Notified to call Dr. [name] for more complete results picture.  Pt indicates understanding.

## 2017-04-17 NOTE — Telephone Encounter (Signed)
New Message     Patient would like you to call him a myocardial perfusion

## 2017-04-18 ENCOUNTER — Other Ambulatory Visit: Payer: Self-pay | Admitting: Internal Medicine

## 2017-04-20 NOTE — Telephone Encounter (Signed)
Done erx 

## 2017-04-22 ENCOUNTER — Other Ambulatory Visit: Payer: Self-pay | Admitting: Internal Medicine

## 2017-04-24 ENCOUNTER — Ambulatory Visit: Payer: PPO | Admitting: Internal Medicine

## 2017-04-30 ENCOUNTER — Other Ambulatory Visit: Payer: Self-pay | Admitting: Internal Medicine

## 2017-05-11 ENCOUNTER — Other Ambulatory Visit: Payer: Self-pay | Admitting: Internal Medicine

## 2017-06-27 ENCOUNTER — Other Ambulatory Visit: Payer: Self-pay | Admitting: Internal Medicine

## 2017-06-29 NOTE — Telephone Encounter (Signed)
Done erx 

## 2017-06-29 NOTE — Telephone Encounter (Signed)
03/31/2017  180# 

## 2017-07-16 ENCOUNTER — Other Ambulatory Visit: Payer: Self-pay | Admitting: Internal Medicine

## 2017-07-25 ENCOUNTER — Other Ambulatory Visit: Payer: Self-pay | Admitting: Internal Medicine

## 2017-08-02 ENCOUNTER — Other Ambulatory Visit: Payer: Self-pay | Admitting: Internal Medicine

## 2017-08-03 NOTE — Telephone Encounter (Signed)
06/30/2017 120# 

## 2017-08-03 NOTE — Telephone Encounter (Signed)
Done erx 

## 2017-08-10 ENCOUNTER — Ambulatory Visit (INDEPENDENT_AMBULATORY_CARE_PROVIDER_SITE_OTHER)
Admission: RE | Admit: 2017-08-10 | Discharge: 2017-08-10 | Disposition: A | Discharge status: Home / Self Care | Payer: PPO | Source: Ambulatory Visit | Attending: Family | Admitting: Family

## 2017-08-10 ENCOUNTER — Ambulatory Visit (INDEPENDENT_AMBULATORY_CARE_PROVIDER_SITE_OTHER): Payer: PPO | Admitting: Family

## 2017-08-10 ENCOUNTER — Encounter: Payer: Self-pay | Admitting: Family

## 2017-08-10 ENCOUNTER — Other Ambulatory Visit: Payer: Self-pay | Admitting: Family

## 2017-08-10 VITALS — BP 120/72 | HR 60 | Temp 98.2°F | Ht 66.0 in | Wt 190.0 lb

## 2017-08-10 DIAGNOSIS — M79672 Pain in left foot: Secondary | ICD-10-CM

## 2017-08-10 DIAGNOSIS — R262 Difficulty in walking, not elsewhere classified: Secondary | ICD-10-CM

## 2017-08-10 DIAGNOSIS — M7732 Calcaneal spur, left foot: Secondary | ICD-10-CM

## 2017-08-10 NOTE — Progress Notes (Signed)
Scott [name] is a 62 y.o. male with the following history as recorded in EpicCare:  Patient Active Problem List   Diagnosis Date Noted  . Chest pain 02/27/2017  . Right ear pain 02/27/2017  . Left groin pain 09/13/2016  . Rib pain on left side 09/13/2016  . Left leg pain 09/13/2016  . Stenosis of cervical spine with myelopathy ([place]) 06/20/2016  . Special screening for malignant neoplasms, colon 04/04/2016  . Cough 01/11/2016  . COPD exacerbation ([place]) 12/22/2015  . Rectal bleeding 08/14/2014  . Intercostal muscle strain 08/07/2014  . Chronic meniscal tear of knee 04/06/2014  . Heel bone fracture 04/06/2014  . Hyperglycemia 04/04/2014  . Recurrent falls 04/04/2014  . Bilateral knee pain 04/04/2014  . Intractable left heel pain 04/04/2014  . Insomnia 04/04/2014  . Hematochezia 04/04/2014  . Bladder neck obstruction 12/19/2013  . Encounter for therapeutic drug monitoring 03/30/2013  . Palpitations 07/06/2012  . Shortness of breath 01/19/2012  . Sick sinus syndrome ([place]) 12/01/2011  . S/P CABG x 1 11/26/2011  . S/P Maze operation for atrial fibrillation 11/26/2011  . CAD (coronary artery disease) 11/25/2011  . Paroxysmal atrial fibrillation ([place]) 11/24/2011  . Chronic diastolic CHF (congestive heart failure) ([place]) 11/24/2011  . Gross hematuria 12/12/2010  . Left knee pain 12/12/2010  . Left knee DJD 07/09/2010  . Preventative health care 07/07/2010  . Chronic anticoagulation 04/08/2010  . COPD (chronic obstructive pulmonary disease) ([place]) 10/04/2008  . OBSTRUCTIVE SLEEP APNEA 01/06/2008  . ALLERGIC RHINITIS 01/14/2007  . Hypothyroidism 09/28/2006  . Hyperlipidemia 09/28/2006  . Anxiety state 09/28/2006  . Essential hypertension 09/28/2006    Current Outpatient Medications  Medication Sig Dispense Refill  . acetaminophen (TYLENOL) 500 MG tablet Take 1,000 mg by mouth every 6 (six) hours as needed for mild pain.     Marland Kitchen albuterol (PROVENTIL HFA;VENTOLIN HFA) 108 (90 Base)  MCG/ACT inhaler Inhale 2 puffs into the lungs every 6 (six) hours as needed for wheezing or shortness of breath. 1 Inhaler 11  . ALPRAZolam (XANAX) 0.5 MG tablet TAKE 1 TABLET BY MOUTH TWICE A DAY AS NEEDED FOR ANXIETY 180 tablet 1  . budesonide-formoterol (SYMBICORT) 160-4.5 MCG/ACT inhaler INHALE 2 PUFFS INTO THE LUNGS 2 (TWO) TIMES DAILY. 30.6 Inhaler 4  . cholecalciferol (VITAMIN D) 1000 units tablet Take 1,000 Units by mouth daily.    Marland Kitchen docusate sodium (COLACE) 100 MG capsule Take 100 mg by mouth daily as needed for mild constipation.    Marland Kitchen ELIQUIS 5 MG TABS tablet TAKE 1 TABLET BY MOUTH 2 TIMES DAILY. 60 tablet 4  . lovastatin (MEVACOR) 40 MG tablet TAKE 2 TABLETS (80 MG TOTAL) BY MOUTH EVERY EVENING. 180 tablet 2  . metoprolol succinate (TOPROL-XL) 50 MG 24 hr tablet TAKE ONE AND ONE HALF TABLETS BY MOUTH DAILY 135 tablet 1  . Multiple Vitamin (MULTIVITAMIN WITH MINERALS) TABS tablet Take 1 tablet by mouth daily.    . Olmesartan-amLODIPine-HCTZ 40-5-12.5 MG TABS TAKE 1 TABLET BY MOUTH EVERY DAY 90 tablet 1  . temazepam (RESTORIL) 30 MG capsule TAKE 1 CAPSULE BY MOUTH AT BEDTIME AS NEEDED FOR SLEEP 90 capsule 1  . traMADol (ULTRAM) 50 MG tablet TAKE 1 TABLET BY MOUTH EVERY 6 HOURS AS NEEDED. 120 tablet 2   No current facility-administered medications for this visit.     Allergies: Zolpidem tartrate  Past Medical History:  Diagnosis Date  . ALLERGIC RHINITIS 01/14/2007  . ANXIETY 09/28/2006  . Asbestos exposure   . Atrial fibrillation (  [place]) 09/28/2006   s/p afib ablation x 2  . CHF (congestive heart failure) ([place])   . COPD (chronic obstructive pulmonary disease) ([place])   . Coronary artery disease 11/24/2011  . Diastolic dysfunction 3/0/1601  . Dyspnea    W/ EXERTION   . Dysrhythmia    AFIB     . Gallstones   . HYPERLIPIDEMIA 09/28/2006  . HYPERTENSION 09/28/2006  . HYPOTHYROIDISM, ACQUIRED NEC 09/28/2006   pt denies and doesn't know  . Long term current use of anticoagulant 04/08/2010  .  OBSTRUCTIVE SLEEP APNEA 01/06/2008   NOT USING    . Right knee DJD 07/09/2010  . S/P CABG x 1 11/26/2011   Right internal mammary artery to right coronary artery  . S/P Maze operation for atrial fibrillation 11/26/2011   complete biatrial lesion set with clipping of LA appendage    Past Surgical History:  Procedure Laterality Date  . ANTERIOR CERVICAL DECOMP/DISCECTOMY FUSION N/A 06/20/2016   Procedure: Anterior Cervical Discectomy Fusion - Cervical five-Cervical six - Cervical six-Cervical seven - Cervicla seven- Thoracic one;  Surgeon: Earnie Larsson, MD;  Location: [facility];  Service: Neurosurgery;  Laterality: N/A;  . CARDIOVERSION     X4   . CERVICAL FUSION  06/20/2016   C5-6, C6-7, C7-T1 anterior cervical discectomy with interbody fusion utilizing interbody peek cages, locally harvested autograft, and anterior plate instrumentation  . CORONARY ARTERY BYPASS GRAFT  11/26/2011   Procedure: CORONARY ARTERY BYPASS GRAFTING (CABG);  Surgeon: Rexene Alberts, MD;  Location: [facility];  Service: Open Heart Surgery;  Laterality: N/A;  coronary artery bypass on pump times one utilizing the right internal mammary artery, transesophageal echocardiogram   . hand surgury Left   . LEFT HEART CATHETERIZATION WITH CORONARY ANGIOGRAM N/A 11/24/2011   Procedure: LEFT HEART CATHETERIZATION WITH CORONARY ANGIOGRAM;  Surgeon: Peter M Martinique, MD;  Location: [facility] CATH LAB;  Service: Cardiovascular;  Laterality: N/A;  . LOOP RECORDER IMPLANT N/A 07/06/2012   Procedure: LOOP RECORDER IMPLANT;  Surgeon: Thompson Grayer, MD;  Location: [facility] CATH LAB;  Service: Cardiovascular;  Laterality: N/A;  . MAZE  11/26/2011   Procedure: MAZE;  Surgeon: Rexene Alberts, MD;  Location: [facility];  Service: Open Heart Surgery;  Laterality: N/A;  Cryomaze   . Radiofrequency Ablation for atrial fibrillation  12/30/07 and 06/05/09   afib ablation x 2 by JA  . Radiofrequency ablation for atrial flutter  2005   CTI    Family History  Problem Relation Age  of Onset  . Dementia Father   . Hypertension Father   . Atrial fibrillation Mother   . Stroke Mother   . Dementia Paternal Grandfather   . Cancer Paternal Grandmother        type unknown    Social History   Tobacco Use  . Smoking status: Former Smoker    Packs/day: 1.50    Years: 26.00    Pack years: 39.00    Types: Cigarettes    Last attempt to quit: 04/06/1995    Years since quitting: 22.3  . Smokeless tobacco: Never Used  Substance Use Topics  . Alcohol use: No    Alcohol/week: 0.0 oz    Subjective:  Patient presents with concerns for left heel pain x 2 days; started suddenly yesterday morning; walked Saturday morning his normal 6 miles with no problems; woke up Sunday with no problems and started hurting later in the day; has taken Tramadol with little benefit; describes as a "steady pain" and painful  to walk; no prior history of gout;   Objective:  Vitals:   08/10/17 1522  BP: 120/72  Pulse: 60  Temp: 98.2 F (36.8 C)  TempSrc: Oral  SpO2: 98%  Weight: 190 lb (86.2 kg)  Height: 5\' 6"  (1.676 m)    General: Well developed, well nourished, in no acute distress  Skin : Warm and dry.  Head: Normocephalic and atraumatic  Lungs: Respirations unlabored; clear to auscultation bilaterally without wheeze, rales, rhonchi  Musculoskeletal: No deformities; no active joint inflammation  Extremities: No edema, cyanosis, clubbing  Vessels: Symmetric bilaterally  Neurologic: Alert and oriented; speech intact; face symmetrical; favors left foot while walking; CNII-XII intact without focal deficit  Assessment:  1. Left foot pain     Plan:  Update X-ray due to sudden onset of pain; follow-up to be determined- will most likely need to see sports medicine or podiatry for some type of walking boot;   No follow-ups on file.  Orders Placed This Encounter  Procedures  . DG Foot Complete Left    Standing Status:   Future    Number of Occurrences:   1    Standing Expiration Date:    10/11/2018    Order Specific Question:   Reason for Exam (SYMPTOM  OR DIAGNOSIS REQUIRED)    Answer:   left foot pain/ sudden onset pain/ painful to bear weight    Order Specific Question:   Preferred imaging location?    Answer:   [facility]    Order Specific Question:   Radiology Contrast Protocol - do NOT remove file path    Answer:   [email or web address]    Requested Prescriptions    No prescriptions requested or ordered in this encounter

## 2017-08-18 ENCOUNTER — Ambulatory Visit (INDEPENDENT_AMBULATORY_CARE_PROVIDER_SITE_OTHER): Payer: PPO | Admitting: Podiatry

## 2017-08-18 ENCOUNTER — Encounter: Payer: Self-pay | Admitting: Podiatry

## 2017-08-18 ENCOUNTER — Ambulatory Visit: Payer: PPO

## 2017-08-18 VITALS — BP 135/71 | HR 51 | Resp 16

## 2017-08-18 DIAGNOSIS — M722 Plantar fascial fibromatosis: Secondary | ICD-10-CM

## 2017-08-18 NOTE — Patient Instructions (Addendum)
Look at getting arch supports for your shoes and wear on both feet. Look at getting a "Power Step" or "Superfeet" inserts. You can get these at a sporting good store like [employer] or [employer]   Plantar Fasciitis (Heel Spur Syndrome) with Rehab The plantar fascia is a fibrous, ligament-like, soft-tissue structure that spans the bottom of the foot. Plantar fasciitis is a condition that causes pain in the foot due to inflammation of the tissue. SYMPTOMS   Pain and tenderness on the underneath side of the foot.  Pain that worsens with standing or walking. CAUSES  Plantar fasciitis is caused by irritation and injury to the plantar fascia on the underneath side of the foot. Common mechanisms of injury include:  Direct trauma to bottom of the foot.  Damage to a small nerve that runs under the foot where the main fascia attaches to the heel bone.  Stress placed on the plantar fascia due to bone spurs. RISK INCREASES WITH:   Activities that place stress on the plantar fascia (running, jumping, pivoting, or cutting).  Poor strength and flexibility.  Improperly fitted shoes.  Tight calf muscles.  Flat feet.  Failure to warm-up properly before activity.  Obesity. PREVENTION  Warm up and stretch properly before activity.  Allow for adequate recovery between workouts.  Maintain physical fitness:  Strength, flexibility, and endurance.  Cardiovascular fitness.  Maintain a health body weight.  Avoid stress on the plantar fascia.  Wear properly fitted shoes, including arch supports for individuals who have flat feet.  PROGNOSIS  If treated properly, then the symptoms of plantar fasciitis usually resolve without surgery. However, occasionally surgery is necessary.  RELATED COMPLICATIONS   Recurrent symptoms that may result in a chronic condition.  Problems of the lower back that are caused by compensating for the injury, such as limping.  Pain or weakness of the foot  during push-off following surgery.  Chronic inflammation, scarring, and partial or complete fascia tear, occurring more often from repeated injections.  TREATMENT  Treatment initially involves the use of ice and medication to help reduce pain and inflammation. The use of strengthening and stretching exercises may help reduce pain with activity, especially stretches of the Achilles tendon. These exercises may be performed at home or with a therapist. Your caregiver may recommend that you use heel cups of arch supports to help reduce stress on the plantar fascia. Occasionally, corticosteroid injections are given to reduce inflammation. If symptoms persist for greater than 6 months despite non-surgical (conservative), then surgery may be recommended.   MEDICATION   If pain medication is necessary, then nonsteroidal anti-inflammatory medications, such as aspirin and ibuprofen, or other minor pain relievers, such as acetaminophen, are often recommended.  Do not take pain medication within 7 days before surgery.  Prescription pain relievers may be given if deemed necessary by your caregiver. Use only as directed and only as much as you need.  Corticosteroid injections may be given by your caregiver. These injections should be reserved for the most serious cases, because they may only be given a certain number of times.  HEAT AND COLD  Cold treatment (icing) relieves pain and reduces inflammation. Cold treatment should be applied for 10 to 15 minutes every 2 to 3 hours for inflammation and pain and immediately after any activity that aggravates your symptoms. Use ice packs or massage the area with a piece of ice (ice massage).  Heat treatment may be used prior to performing the stretching and strengthening activities prescribed  by your caregiver, physical therapist, or athletic trainer. Use a heat pack or soak the injury in warm water.  SEEK IMMEDIATE MEDICAL CARE IF:  Treatment seems to offer no  benefit, or the condition worsens.  Any medications produce adverse side effects.  EXERCISES- RANGE OF MOTION (ROM) AND STRETCHING EXERCISES - Plantar Fasciitis (Heel Spur Syndrome) These exercises may help you when beginning to rehabilitate your injury. Your symptoms may resolve with or without further involvement from your physician, physical therapist or athletic trainer. While completing these exercises, remember:   Restoring tissue flexibility helps normal motion to return to the joints. This allows healthier, less painful movement and activity.  An effective stretch should be held for at least 30 seconds.  A stretch should never be painful. You should only feel a gentle lengthening or release in the stretched tissue.  RANGE OF MOTION - Toe Extension, Flexion  Sit with your right / left leg crossed over your opposite knee.  Grasp your toes and gently pull them back toward the top of your foot. You should feel a stretch on the bottom of your toes and/or foot.  Hold this stretch for 10 seconds.  Now, gently pull your toes toward the bottom of your foot. You should feel a stretch on the top of your toes and or foot.  Hold this stretch for 10 seconds. Repeat  times. Complete this stretch 3 times per day.   RANGE OF MOTION - Ankle Dorsiflexion, Active Assisted  Remove shoes and sit on a chair that is preferably not on a carpeted surface.  Place right / left foot under knee. Extend your opposite leg for support.  Keeping your heel down, slide your right / left foot back toward the chair until you feel a stretch at your ankle or calf. If you do not feel a stretch, slide your bottom forward to the edge of the chair, while still keeping your heel down.  Hold this stretch for 10 seconds. Repeat 3 times. Complete this stretch 2 times per day.   STRETCH  Gastroc, Standing  Place hands on wall.  Extend right / left leg, keeping the front knee somewhat bent.  Slightly point your  toes inward on your back foot.  Keeping your right / left heel on the floor and your knee straight, shift your weight toward the wall, not allowing your back to arch.  You should feel a gentle stretch in the right / left calf. Hold this position for 10 seconds. Repeat 3 times. Complete this stretch 2 times per day.  STRETCH  Soleus, Standing  Place hands on wall.  Extend right / left leg, keeping the other knee somewhat bent.  Slightly point your toes inward on your back foot.  Keep your right / left heel on the floor, bend your back knee, and slightly shift your weight over the back leg so that you feel a gentle stretch deep in your back calf.  Hold this position for 10 seconds. Repeat 3 times. Complete this stretch 2 times per day.  STRETCH  Gastrocsoleus, Standing  Note: This exercise can place a lot of stress on your foot and ankle. Please complete this exercise only if specifically instructed by your caregiver.   Place the ball of your right / left foot on a step, keeping your other foot firmly on the same step.  Hold on to the wall or a rail for balance.  Slowly lift your other foot, allowing your body weight to press your  heel down over the edge of the step.  You should feel a stretch in your right / left calf.  Hold this position for 10 seconds.  Repeat this exercise with a slight bend in your right / left knee. Repeat 3 times. Complete this stretch 2 times per day.   STRENGTHENING EXERCISES - Plantar Fasciitis (Heel Spur Syndrome)  These exercises may help you when beginning to rehabilitate your injury. They may resolve your symptoms with or without further involvement from your physician, physical therapist or athletic trainer. While completing these exercises, remember:   Muscles can gain both the endurance and the strength needed for everyday activities through controlled exercises.  Complete these exercises as instructed by your physician, physical therapist or  athletic trainer. Progress the resistance and repetitions only as guided.  STRENGTH - Towel Curls  Sit in a chair positioned on a non-carpeted surface.  Place your foot on a towel, keeping your heel on the floor.  Pull the towel toward your heel by only curling your toes. Keep your heel on the floor. Repeat 3 times. Complete this exercise 2 times per day.  STRENGTH - Ankle Inversion  Secure one end of a rubber exercise band/tubing to a fixed object (table, pole). Loop the other end around your foot just before your toes.  Place your fists between your knees. This will focus your strengthening at your ankle.  Slowly, pull your big toe up and in, making sure the band/tubing is positioned to resist the entire motion.  Hold this position for 10 seconds.  Have your muscles resist the band/tubing as it slowly pulls your foot back to the starting position. Repeat 3 times. Complete this exercises 2 times per day.  Document Released: 02/10/2005 Document Revised: 05/05/2011 Document Reviewed: 05/25/2008 [employer] Patient Information 2014 [place], [place].

## 2017-08-19 DIAGNOSIS — M722 Plantar fascial fibromatosis: Secondary | ICD-10-CM | POA: Insufficient documentation

## 2017-08-19 NOTE — Progress Notes (Signed)
Subjective:   Patient ID: Scott [name], male   DOB: 62 y.o.   MRN: [id number]   HPI 62 year old male presents the office today for concerns of pain in the bottom of his left heel which is been ongoing for about 2 weeks he describes an aching sensation.  His dog in the morning 1 day.  He gets pain when he first gets up and will occasionally get some sharp sensation to the bottom of his heel.  Denies any numbness or tingling.  The pain does not wake him up at night.  He has no other concerns today.   Review of Systems  All other systems reviewed and are negative.   Past Medical History:  Diagnosis Date  . ALLERGIC RHINITIS 01/14/2007  . ANXIETY 09/28/2006  . Asbestos exposure   . Atrial fibrillation ([place]) 09/28/2006   s/p afib ablation x 2  . CHF (congestive heart failure) ([place])   . COPD (chronic obstructive pulmonary disease) ([place])   . Coronary artery disease 11/24/2011  . Diastolic dysfunction 04/02/7822  . Dyspnea    W/ EXERTION   . Dysrhythmia    AFIB     . Gallstones   . HYPERLIPIDEMIA 09/28/2006  . HYPERTENSION 09/28/2006  . HYPOTHYROIDISM, ACQUIRED NEC 09/28/2006   pt denies and doesn't know  . Long term current use of anticoagulant 04/08/2010  . OBSTRUCTIVE SLEEP APNEA 01/06/2008   NOT USING    . Right knee DJD 07/09/2010  . S/P CABG x 1 11/26/2011   Right internal mammary artery to right coronary artery  . S/P Maze operation for atrial fibrillation 11/26/2011   complete biatrial lesion set with clipping of LA appendage    Past Surgical History:  Procedure Laterality Date  . ANTERIOR CERVICAL DECOMP/DISCECTOMY FUSION N/A 06/20/2016   Procedure: Anterior Cervical Discectomy Fusion - Cervical five-Cervical six - Cervical six-Cervical seven - Cervicla seven- Thoracic one;  Surgeon: Earnie Larsson, MD;  Location: [facility];  Service: Neurosurgery;  Laterality: N/A;  . CARDIOVERSION     X4   . CERVICAL FUSION  06/20/2016   C5-6, C6-7, C7-T1 anterior cervical discectomy with interbody fusion  utilizing interbody peek cages, locally harvested autograft, and anterior plate instrumentation  . CORONARY ARTERY BYPASS GRAFT  11/26/2011   Procedure: CORONARY ARTERY BYPASS GRAFTING (CABG);  Surgeon: Rexene Alberts, MD;  Location: [facility];  Service: Open Heart Surgery;  Laterality: N/A;  coronary artery bypass on pump times one utilizing the right internal mammary artery, transesophageal echocardiogram   . hand surgury Left   . LEFT HEART CATHETERIZATION WITH CORONARY ANGIOGRAM N/A 11/24/2011   Procedure: LEFT HEART CATHETERIZATION WITH CORONARY ANGIOGRAM;  Surgeon: Peter M Martinique, MD;  Location: [facility] CATH LAB;  Service: Cardiovascular;  Laterality: N/A;  . LOOP RECORDER IMPLANT N/A 07/06/2012   Procedure: LOOP RECORDER IMPLANT;  Surgeon: Thompson Grayer, MD;  Location: [facility] CATH LAB;  Service: Cardiovascular;  Laterality: N/A;  . MAZE  11/26/2011   Procedure: MAZE;  Surgeon: Rexene Alberts, MD;  Location: [facility];  Service: Open Heart Surgery;  Laterality: N/A;  [place]aze   . Radiofrequency Ablation for atrial fibrillation  12/30/07 and 06/05/09   afib ablation x 2 by JA  . Radiofrequency ablation for atrial flutter  2005   CTI     Current Outpatient Medications:  .  acetaminophen (TYLENOL) 500 MG tablet, Take 1,000 mg by mouth every 6 (six) hours as needed for mild pain. , Disp: , Rfl:  .  albuterol (PROVENTIL  HFA;VENTOLIN HFA) 108 (90 Base) MCG/ACT inhaler, Inhale 2 puffs into the lungs every 6 (six) hours as needed for wheezing or shortness of breath., Disp: 1 Inhaler, Rfl: 11 .  ALPRAZolam (XANAX) 0.5 MG tablet, TAKE 1 TABLET BY MOUTH TWICE A DAY AS NEEDED FOR ANXIETY, Disp: 180 tablet, Rfl: 1 .  budesonide-formoterol (SYMBICORT) 160-4.5 MCG/ACT inhaler, INHALE 2 PUFFS INTO THE LUNGS 2 (TWO) TIMES DAILY., Disp: 30.6 Inhaler, Rfl: 4 .  cholecalciferol (VITAMIN D) 1000 units tablet, Take 1,000 Units by mouth daily., Disp: , Rfl:  .  docusate sodium (COLACE) 100 MG capsule, Take 100 mg by mouth daily as  needed for mild constipation., Disp: , Rfl:  .  ELIQUIS 5 MG TABS tablet, TAKE 1 TABLET BY MOUTH 2 TIMES DAILY., Disp: 60 tablet, Rfl: 4 .  lovastatin (MEVACOR) 40 MG tablet, TAKE 2 TABLETS (80 MG TOTAL) BY MOUTH EVERY EVENING., Disp: 180 tablet, Rfl: 2 .  metoprolol succinate (TOPROL-XL) 50 MG 24 hr tablet, TAKE ONE AND ONE HALF TABLETS BY MOUTH DAILY, Disp: 135 tablet, Rfl: 1 .  Multiple Vitamin (MULTIVITAMIN WITH MINERALS) TABS tablet, Take 1 tablet by mouth daily., Disp: , Rfl:  .  Olmesartan-amLODIPine-HCTZ 40-5-12.5 MG TABS, TAKE 1 TABLET BY MOUTH EVERY DAY, Disp: 90 tablet, Rfl: 1 .  temazepam (RESTORIL) 30 MG capsule, TAKE 1 CAPSULE BY MOUTH AT BEDTIME AS NEEDED FOR SLEEP, Disp: 90 capsule, Rfl: 1 .  traMADol (ULTRAM) 50 MG tablet, TAKE 1 TABLET BY MOUTH EVERY 6 HOURS AS NEEDED., Disp: 120 tablet, Rfl: 2  Allergies  Allergen Reactions  . Zolpidem Tartrate Other (See Comments)    Extremely drowsy the next day    Social History   Socioeconomic History  . Marital status: Single    Spouse name: Not on file  . Number of children: 0  . Years of education: Not on file  . Highest education level: Not on file  Occupational History  . Occupation: worked previously in the [employer] and feels that he was exposed to asbestos.    Employer: [employer]  Social Needs  . Financial resource strain: Not on file  . Food insecurity:    Worry: Not on file    Inability: Not on file  . Transportation needs:    Medical: Not on file    Non-medical: Not on file  Tobacco Use  . Smoking status: Former Smoker    Packs/day: 1.50    Years: 26.00    Pack years: 39.00    Types: Cigarettes    Last attempt to quit: 04/06/1995    Years since quitting: 22.3  . Smokeless tobacco: Never Used  Substance and Sexual Activity  . Alcohol use: No    Alcohol/week: 0.0 oz  . Drug use: No  . Sexual activity: Yes  Lifestyle  . Physical activity:    Days per week: Not on file    Minutes per  session: Not on file  . Stress: Not on file  Relationships  . Social connections:    Talks on phone: Not on file    Gets together: Not on file    Attends religious service: Not on file    Active member of club or organization: Not on file    Attends meetings of clubs or organizations: Not on file    Relationship status: Not on file  . Intimate partner violence:    Fear of current or ex partner: Not on file    Emotionally abused:  Not on file    Physically abused: Not on file    Forced sexual activity: Not on file  Other Topics Concern  . Not on file  Social History Narrative   Pt lives in [place], [place] with his friend.       Objective:  Physical Exam  General: AAO x3, NAD  Dermatological: Skin is warm, dry and supple bilateral. Nails x 10 are well manicured; remaining integument appears unremarkable at this time. There are no open sores, no preulcerative lesions, no rash or signs of infection present.  Vascular: Dorsalis Pedis artery and Posterior Tibial artery pedal pulses are 2/4 bilateral with immedate capillary fill time. There is no pain with calf compression, swelling, warmth, erythema.   Neruologic: Grossly intact via light touch bilateral.  Protective threshold with Semmes Wienstein monofilament intact to all pedal sites bilateral.  Negative Tinel sign.  Musculoskeletal:Tenderness to palpation along the plantar medial tubercle of the calcaneus at the insertion of plantar fascia on the left foot. There is no pain along the course of the plantar fascia within the arch of the foot. Plantar fascia appears to be intact. There is no pain with lateral compression of the calcaneus or pain with vibratory sensation. There is no pain along the course or insertion of the achilles tendon. No other areas of tenderness to bilateral lower extremities.  Muscular strength 5/5 in all groups tested bilateral.  Gait: Unassisted, Nonantalgic.       Assessment:  Left heel pain, plantar  fasciitis     Plan:  -Treatment options discussed including all alternatives, risks, and complications -Etiology of symptoms were discussed -I reviewed the x-rays from his primary care physician's office. -Given medical history we will hold off on anti-inflammatories.  He wants to hold off on steroids or steroid injections.  We discussed stretching, icing exercises.  Discussed shoe modifications and orthotics.  A plantar fascial brace was dispensed.  We also discussed the different kinds of creams he can get over-the-counter including Aspercreme or Biofreeze for the area to avoid any medications. Follow-up in the next 3 to 4 weeks if symptoms continue or sooner if any issues are to arise.  Trula Slade DPM

## 2017-08-28 ENCOUNTER — Encounter: Payer: Self-pay | Admitting: Internal Medicine

## 2017-08-28 ENCOUNTER — Ambulatory Visit (INDEPENDENT_AMBULATORY_CARE_PROVIDER_SITE_OTHER): Payer: PPO | Admitting: Internal Medicine

## 2017-08-28 ENCOUNTER — Other Ambulatory Visit (INDEPENDENT_AMBULATORY_CARE_PROVIDER_SITE_OTHER): Payer: PPO

## 2017-08-28 VITALS — BP 116/78 | HR 69 | Temp 97.9°F | Ht 66.0 in | Wt 189.0 lb

## 2017-08-28 DIAGNOSIS — Z0000 Encounter for general adult medical examination without abnormal findings: Secondary | ICD-10-CM

## 2017-08-28 DIAGNOSIS — R739 Hyperglycemia, unspecified: Secondary | ICD-10-CM | POA: Diagnosis not present

## 2017-08-28 LAB — LIPID PANEL
CHOLESTEROL: 124 mg/dL (ref 0–200)
HDL: 45.1 mg/dL (ref >39.00)
LDL CALC: 70 mg/dL (ref 0–99)
NonHDL: 78.61
Total CHOL/HDL Ratio: 3
Triglycerides: 42 mg/dL (ref 0.0–149.0)
VLDL: 8.4 mg/dL (ref 0.0–40.0)

## 2017-08-28 LAB — TSH: TSH: 2.71 u[IU]/mL (ref 0.35–4.50)

## 2017-08-28 LAB — URINALYSIS, ROUTINE W REFLEX MICROSCOPIC
Bilirubin Urine: NEGATIVE
HGB URINE DIPSTICK: NEGATIVE
Ketones, ur: NEGATIVE
Leukocytes, UA: NEGATIVE
NITRITE: NEGATIVE
PH: 5.5 (ref 5.0–8.0)
Specific Gravity, Urine: >=1.03 — AB (ref 1.000–1.030)
TOTAL PROTEIN, URINE-UPE24: NEGATIVE
URINE GLUCOSE: NEGATIVE
Urobilinogen, UA: 0.2 (ref 0.0–1.0)

## 2017-08-28 LAB — CBC WITH DIFFERENTIAL/PLATELET
BASOS PCT: 0.7 % (ref 0.0–3.0)
Basophils Absolute: 0.1 10*3/uL (ref 0.0–0.1)
Eosinophils Absolute: 0.3 10*3/uL (ref 0.0–0.7)
Eosinophils Relative: 3.7 % (ref 0.0–5.0)
HEMATOCRIT: 42.4 % (ref 39.0–52.0)
Hemoglobin: 14.4 g/dL (ref 13.0–17.0)
LYMPHS ABS: 1.1 10*3/uL (ref 0.7–4.0)
LYMPHS PCT: 15.4 % (ref 12.0–46.0)
MCHC: 34 g/dL (ref 30.0–36.0)
MCV: 94 fl (ref 78.0–100.0)
MONOS PCT: 9 % (ref 3.0–12.0)
Monocytes Absolute: 0.6 10*3/uL (ref 0.1–1.0)
NEUTROS ABS: 5 10*3/uL (ref 1.4–7.7)
NEUTROS PCT: 71.2 % (ref 43.0–77.0)
PLATELETS: 180 10*3/uL (ref 150.0–400.0)
RBC: 4.51 Mil/uL (ref 4.22–5.81)
RDW: 14.2 % (ref 11.5–15.5)
WBC: 7 10*3/uL (ref 4.0–10.5)

## 2017-08-28 LAB — BASIC METABOLIC PANEL
BUN: 17 mg/dL (ref 6–23)
CHLORIDE: 108 meq/L (ref 96–112)
CO2: 27 meq/L (ref 19–32)
CREATININE: 1.18 mg/dL (ref 0.40–1.50)
Calcium: 9.3 mg/dL (ref 8.4–10.5)
GFR: 66.4 mL/min (ref >60.00)
Glucose, Bld: 106 mg/dL — ABNORMAL HIGH (ref 70–99)
Potassium: 4.4 mEq/L (ref 3.5–5.1)
Sodium: 143 mEq/L (ref 135–145)

## 2017-08-28 LAB — HEMOGLOBIN A1C: HEMOGLOBIN A1C: 5.4 % (ref 4.6–6.5)

## 2017-08-28 LAB — HEPATIC FUNCTION PANEL
ALK PHOS: 46 U/L (ref 39–117)
ALT: 24 U/L (ref 0–53)
AST: 27 U/L (ref 0–37)
Albumin: 4.2 g/dL (ref 3.5–5.2)
Bilirubin, Direct: 0.1 mg/dL (ref 0.0–0.3)
TOTAL PROTEIN: 6.7 g/dL (ref 6.0–8.3)
Total Bilirubin: 0.5 mg/dL (ref 0.2–1.2)

## 2017-08-28 LAB — PSA: PSA: 1.64 ng/mL (ref 0.10–4.00)

## 2017-08-28 NOTE — Patient Instructions (Addendum)
You will be contacted regarding the referral for: Gastroenterology for colonoscopy  Please continue all other medications as before, and refills have been done if requested.  Please have the pharmacy call with any other refills you may need.  Please continue your efforts at being more active, low cholesterol diet, and weight control.  You are otherwise up to date with prevention measures today.  Please keep your appointments with your specialists as you may have planned  Please go to the LAB in the Basement (turn left off the elevator) for the tests to be done today  You will be contacted by phone if any changes need to be made immediately.  Otherwise, you will receive a letter about your results with an explanation, but please check with MyChart first.  Please remember to sign up for MyChart if you have not done so, as this will be important to you in the future with finding out test results, communicating by private email, and scheduling acute appointments online when needed.  Please return in 6 months, or sooner if needed, with Lab testing done 3-5 days before

## 2017-08-28 NOTE — Progress Notes (Signed)
Subjective:    Patient ID: Scott [name], male    DOB: 1955/08/31, 62 y.o.   MRN: [id number]  HPI  Here for wellness and f/u;  Overall doing ok;  Pt denies Chest pain, worsening SOB, DOE, wheezing, orthopnea, PND, worsening LE edema, palpitations, dizziness or syncope.  Pt denies neurological change such as new headache, facial or extremity weakness.  Pt denies polydipsia, polyuria, or low sugar symptoms. Pt states overall good compliance with treatment and medications, good tolerability, and has been trying to follow appropriate diet.  Pt denies worsening depressive symptoms, suicidal ideation or panic. No fever, night sweats, wt loss, loss of appetite, or other constitutional symptoms.  Pt states good ability with ADL's, has low fall risk, home safety reviewed and adequate, no other significant changes in hearing or vision, and only occasionally active with exercise. No new complaints Past Medical History:  Diagnosis Date  . ALLERGIC RHINITIS 01/14/2007  . ANXIETY 09/28/2006  . Asbestos exposure   . Atrial fibrillation ([place]) 09/28/2006   s/p afib ablation x 2  . CHF (congestive heart failure) ([place])   . COPD (chronic obstructive pulmonary disease) ([place])   . Coronary artery disease 11/24/2011  . Diastolic dysfunction 07/26/8313  . Dyspnea    W/ EXERTION   . Dysrhythmia    AFIB     . Gallstones   . HYPERLIPIDEMIA 09/28/2006  . HYPERTENSION 09/28/2006  . HYPOTHYROIDISM, ACQUIRED NEC 09/28/2006   pt denies and doesn't know  . Long term current use of anticoagulant 04/08/2010  . OBSTRUCTIVE SLEEP APNEA 01/06/2008   NOT USING    . Right knee DJD 07/09/2010  . S/P CABG x 1 11/26/2011   Right internal mammary artery to right coronary artery  . S/P Maze operation for atrial fibrillation 11/26/2011   complete biatrial lesion set with clipping of LA appendage   Past Surgical History:  Procedure Laterality Date  . ANTERIOR CERVICAL DECOMP/DISCECTOMY FUSION N/A 06/20/2016   Procedure: Anterior Cervical  Discectomy Fusion - Cervical five-Cervical six - Cervical six-Cervical seven - Cervicla seven- Thoracic one;  Surgeon: Earnie Larsson, MD;  Location: [place];  Service: Neurosurgery;  Laterality: N/A;  . CARDIOVERSION     X4   . CERVICAL FUSION  06/20/2016   C5-6, C6-7, C7-T1 anterior cervical discectomy with interbody fusion utilizing interbody peek cages, locally harvested autograft, and anterior plate instrumentation  . CORONARY ARTERY BYPASS GRAFT  11/26/2011   Procedure: CORONARY ARTERY BYPASS GRAFTING (CABG);  Surgeon: Rexene Alberts, MD;  Location: [place];  Service: Open Heart Surgery;  Laterality: N/A;  coronary artery bypass on pump times one utilizing the right internal mammary artery, transesophageal echocardiogram   . hand surgury Left   . LEFT HEART CATHETERIZATION WITH CORONARY ANGIOGRAM N/A 11/24/2011   Procedure: LEFT HEART CATHETERIZATION WITH CORONARY ANGIOGRAM;  Surgeon: Peter M Martinique, MD;  Location: [facility] C[facility] LAB;  Service: Cardiovascular;  Laterality: N/A;  . LOOP RECORDER IMPLANT N/A 07/06/2012   Procedure: LOOP RECORDER IMPLANT;  Surgeon: Thompson Grayer, MD;  Location: [facility] C[facility] LAB;  Service: Cardiovascular;  Laterality: N/A;  . MAZE  11/26/2011   Procedure: MAZE;  Surgeon: Rexene Alberts, MD;  Location: [place];  Service: Open Heart Surgery;  Laterality: N/A;  [place]   . Radiofrequency Ablation for atrial fibrillation  12/30/07 and 06/05/09   afib ablation x 2 by [facility]  . Radiofrequency ablation for atrial flutter  2005   [facility]    reports that he quit smoking about  22 years ago. His smoking use included cigarettes. He has a 39.00 pack-year smoking history. He has never used smokeless tobacco. He reports that he does not drink alcohol or use drugs. family history includes Atrial fibrillation in his mother; Cancer in his paternal grandmother; Dementia in his father and paternal grandfather; Hypertension in his father; Stroke in his mother. Allergies  Allergen Reactions  . Zolpidem Tartrate  Other (See Comments)    Extremely drowsy the next day   Current Outpatient Medications on File Prior to Visit  Medication Sig Dispense Refill  . acetaminophen (TYLENOL) 500 MG tablet Take 1,000 mg by mouth every 6 (six) hours as needed for mild pain.     Marland Kitchen albuterol (PROVENTIL HFA;VENTOLIN HFA) 108 (90 Base) MCG/ACT inhaler Inhale 2 puffs into the lungs every 6 (six) hours as needed for wheezing or shortness of breath. 1 Inhaler 11  . ALPRAZolam (XANAX) 0.5 MG tablet TAKE 1 TABLET BY MOUTH TWICE A DAY AS NEEDED FOR ANXIETY 180 tablet 1  . budesonide-formoterol (SYMBICORT) 160-4.5 MCG/ACT inhaler INHALE 2 PUFFS INTO THE LUNGS 2 (TWO) TIMES DAILY. 30.6 Inhaler 4  . cholecalciferol (VITAMIN D) 1000 units tablet Take 1,000 Units by mouth daily.    Marland Kitchen docusate sodium (COLACE) 100 MG capsule Take 100 mg by mouth daily as needed for mild constipation.    Marland Kitchen ELIQUIS 5 MG TABS tablet TAKE 1 TABLET BY MOUTH 2 TIMES DAILY. 60 tablet 4  . lovastatin (MEVACOR) 40 MG tablet TAKE 2 TABLETS (80 MG TOTAL) BY MOUTH EVERY EVENING. 180 tablet 2  . metoprolol succinate (TOPROL-XL) 50 MG 24 hr tablet TAKE ONE AND ONE HALF TABLETS BY MOUTH DAILY 135 tablet 1  . Multiple Vitamin (MULTIVITAMIN WITH MINERALS) TABS tablet Take 1 tablet by mouth daily.    . Olmesartan-amLODIPine-HCTZ 40-5-12.5 MG TABS TAKE 1 TABLET BY MOUTH EVERY DAY 90 tablet 1  . temazepam (RESTORIL) 30 MG capsule TAKE 1 CAPSULE BY MOUTH AT BEDTIME AS NEEDED FOR SLEEP 90 capsule 1  . traMADol (ULTRAM) 50 MG tablet TAKE 1 TABLET BY MOUTH EVERY 6 HOURS AS NEEDED. 120 tablet 2   No current facility-administered medications on file prior to visit.    Review of Systems Constitutional: Negative for other unusual diaphoresis, sweats, appetite or weight changes HENT: Negative for other worsening hearing loss, ear pain, facial swelling, mouth sores or neck stiffness.   Eyes: Negative for other worsening pain, redness or other visual disturbance.  Respiratory:  Negative for other stridor or swelling Cardiovascular: Negative for other palpitations or other chest pain  Gastrointestinal: Negative for worsening diarrhea or loose stools, blood in stool, distention or other pain Genitourinary: Negative for hematuria, flank pain or other change in urine volume.  Musculoskeletal: Negative for myalgias or other joint swelling.  Skin: Negative for other color change, or other wound or worsening drainage.  Neurological: Negative for other syncope or numbness. Hematological: Negative for other adenopathy or swelling Psychiatric/Behavioral: Negative for hallucinations, other worsening agitation, SI, self-injury, or new decreased concentration All other system neg per pt    Objective:   Physical Exam BP 116/78   Pulse 69   Temp 97.9 F (36.6 C) (Oral)   Ht 5\' 6"  (1.676 m)   Wt 189 lb (85.7 kg)   SpO2 96%   BMI 30.51 kg/m  VS noted,  Constitutional: Pt is oriented to person, place, and time. Appears well-developed and well-nourished, in no significant distress and comfortable Head: Normocephalic and atraumatic  Eyes: Conjunctivae and EOM  are normal. Pupils are equal, round, and reactive to light Right Ear: External ear normal without discharge Left Ear: External ear normal without discharge Nose: Nose without discharge or deformity Mouth/Throat: Oropharynx is without other ulcerations and moist  Neck: Normal range of motion. Neck supple. No JVD present. No tracheal deviation present or significant neck LA or mass Cardiovascular: Normal rate, regular rhythm, normal heart sounds and intact distal pulses.  Pulmonary/Chest: WOB normal and breath sounds without rales or wheezing  Abdominal: Soft. Bowel sounds are normal. NT. No HSM  Musculoskeletal: Normal range of motion. Exhibits no edema Lymphadenopathy: Has no other cervical adenopathy.  Neurological: Pt is alert and oriented to person, place, and time. Pt has normal reflexes. No cranial nerve deficit.  Motor grossly intact, Gait intact Skin: Skin is warm and dry. No rash noted or new ulcerations Psychiatric:  Has normal mood and affect. Behavior is normal without agitation No other exam findings Lab Results  Component Value Date   WBC 6.6 02/27/2017   HGB 13.6 02/27/2017   HCT 41.0 02/27/2017   PLT 185.0 02/27/2017   GLUCOSE 115 (H) 02/27/2017   CHOL 142 02/27/2017   TRIG 64.0 02/27/2017   HDL 52.60 02/27/2017   LDLCALC 77 02/27/2017   ALT 19 02/27/2017   AST 22 02/27/2017   NA 142 02/27/2017   K 4.1 02/27/2017   CL 104 02/27/2017   CREATININE 1.20 02/27/2017   BUN 18 02/27/2017   CO2 32 02/27/2017   TSH 2.54 05/15/2016   PSA 0.72 05/15/2016   INR 0.98 06/20/2016   HGBA1C 5.6 02/27/2017       Assessment & Plan:

## 2017-08-28 NOTE — Assessment & Plan Note (Signed)

## 2017-08-28 NOTE — Assessment & Plan Note (Signed)
stable overall by history and exam, recent data reviewed with pt, and pt to continue medical treatment as before,  to f/u any worsening symptoms or concerns Lab Results  Component Value Date   HGBA1C 5.6 02/27/2017

## 2017-09-01 ENCOUNTER — Ambulatory Visit (INDEPENDENT_AMBULATORY_CARE_PROVIDER_SITE_OTHER): Payer: PPO | Admitting: Podiatry

## 2017-09-01 DIAGNOSIS — M722 Plantar fascial fibromatosis: Secondary | ICD-10-CM | POA: Diagnosis not present

## 2017-09-02 NOTE — Progress Notes (Signed)
Subjective: 62 year old male presents the office today for follow-up evaluation of left heel pain, plantar fasciitis.  He states he is doing better but he still gets discomfort.  He did get an over-the-counter insert inside of his shoes which is helpful as well.  He has been using a plantar fascial brace has been stretching icing.  He gets pain when he first gets up after being on his feet all day as well. Denies any systemic complaints such as fevers, chills, nausea, vomiting. No acute changes since last appointment, and no other complaints at this time.   Objective: AAO x3, NAD DP/PT pulses palpable bilaterally, CRT less than 3 seconds There is tenderness palpation on the plantar medial tubercle of the calcaneus at the insertion of the plantar fascia the left side.  Minimal discomfort on the medial band within the arch of the foot as well.  No pain with lateral compression of calcaneus.  No edema, erythema.  No pain to the toes tenderness appears to be intact.  No open lesions or pre-ulcerative lesions.  No pain with calf compression, swelling, warmth, erythema  Assessment: Left plantar fasciitis  Plan: -All treatment options discussed with the patient including all alternatives, risks, complications.  -He wishes to hold off on medications.  Continue with stretching, icing daily.  Discussed a more supportive orthotic.  Orthotic that he is not very flexible.  I want to try a "powerstep" or "superfeet" type insert.  Also dispensed a night splint today to help with the groin pain.  Continue with supportive shoes as well. -Patient encouraged to call the office with any questions, concerns, change in symptoms.   Trula Slade DPM

## 2017-09-18 ENCOUNTER — Encounter: Payer: Self-pay | Admitting: Internal Medicine

## 2017-10-09 ENCOUNTER — Ambulatory Visit (INDEPENDENT_AMBULATORY_CARE_PROVIDER_SITE_OTHER): Payer: PPO | Admitting: *Deleted

## 2017-10-09 ENCOUNTER — Telehealth: Payer: Self-pay | Admitting: *Deleted

## 2017-10-09 VITALS — BP 144/62 | HR 65 | Resp 17 | Ht 66.0 in | Wt 187.0 lb

## 2017-10-09 DIAGNOSIS — Z0000 Encounter for general adult medical examination without abnormal findings: Secondary | ICD-10-CM

## 2017-10-09 NOTE — Patient Instructions (Addendum)
Continue doing brain stimulating activities (puzzles, reading, adult coloring books, staying active) to keep memory sharp.   Continue to eat heart healthy diet (full of fruits, vegetables, whole grains, lean protein, water--limit salt, fat, and sugar intake) and increase physical activity as tolerated.   Mr. Joanne Chars , Thank you for taking time to come for your Medicare Wellness Visit. I appreciate your ongoing commitment to your health goals. Please review the following plan we discussed and let me know if I can assist you in the future.   These are the goals we discussed: Goals    . Patient Stated     Maintain current health status.    . Weight (lb) < 155 lb (70.3 kg)       This is a list of the screening recommended for you and due dates:  Health Maintenance  Topic Date Due  . Flu Shot  05/29/2018*  . Colon Cancer Screening  10/10/2018*  . Tetanus Vaccine  04/04/2024  .  Hepatitis C: One time screening is recommended by Center for Disease Control  (CDC) for  adults born from [birth year] through 1965.   Completed  . HIV Screening  Completed  *Topic was postponed. The date shown is not the original due date.     Health Maintenance, Male A healthy lifestyle and preventive care is important for your health and wellness. Ask your health care provider about what schedule of regular examinations is right for you. What should I know about weight and diet? Eat a Healthy Diet  Eat plenty of vegetables, fruits, whole grains, low-fat dairy products, and lean protein.  Do not eat a lot of foods high in solid fats, added sugars, or salt.  Maintain a Healthy Weight Regular exercise can help you achieve or maintain a healthy weight. You should:  Do at least 150 minutes of exercise each week. The exercise should increase your heart rate and make you sweat (moderate-intensity exercise).  Do strength-training exercises at least twice a week.  Watch Your Levels of Cholesterol and Blood  Lipids  Have your blood tested for lipids and cholesterol every 5 years starting at 62 years of age. If you are at high risk for heart disease, you should start having your blood tested when you are 62 years old. You may need to have your cholesterol levels checked more often if: ? Your lipid or cholesterol levels are high. ? You are older than 62 years of age. ? You are at high risk for heart disease.  What should I know about cancer screening? Many types of cancers can be detected early and may often be prevented. Lung Cancer  You should be screened every year for lung cancer if: ? You are a current smoker who has smoked for at least 30 years. ? You are a former smoker who has quit within the past 15 years.  Talk to your health care provider about your screening options, when you should start screening, and how often you should be screened.  Colorectal Cancer  Routine colorectal cancer screening usually begins at 62 years of age and should be repeated every 5-10 years until you are 62 years old. You may need to be screened more often if early forms of precancerous polyps or small growths are found. Your health care provider may recommend screening at an earlier age if you have risk factors for colon cancer.  Your health care provider may recommend using home test kits to check for hidden blood in the stool.  A small camera at the end of a tube can be used to examine your colon (sigmoidoscopy or colonoscopy). This checks for the earliest forms of colorectal cancer.  Prostate and Testicular Cancer  Depending on your age and overall health, your health care provider may do certain tests to screen for prostate and testicular cancer.  Talk to your health care provider about any symptoms or concerns you have about testicular or prostate cancer.  Skin Cancer  Check your skin from head to toe regularly.  Tell your health care provider about any new moles or changes in moles, especially  if: ? There is a change in a mole's size, shape, or color. ? You have a mole that is larger than a pencil eraser.  Always use sunscreen. Apply sunscreen liberally and repeat throughout the day.  Protect yourself by wearing long sleeves, pants, a wide-brimmed hat, and sunglasses when outside.  What should I know about heart disease, diabetes, and high blood pressure?  If you are 91-30 years of age, have your blood pressure checked every 3-5 years. If you are 33 years of age or older, have your blood pressure checked every year. You should have your blood pressure measured twice-once when you are at a hospital or clinic, and once when you are not at a hospital or clinic. Record the average of the two measurements. To check your blood pressure when you are not at a hospital or clinic, you can use: ? An automated blood pressure machine at a pharmacy. ? A home blood pressure monitor.  Talk to your health care provider about your target blood pressure.  If you are between 57-33 years old, ask your health care provider if you should take aspirin to prevent heart disease.  Have regular diabetes screenings by checking your fasting blood sugar level. ? If you are at a normal weight and have a low risk for diabetes, have this test once every three years after the age of 66. ? If you are overweight and have a high risk for diabetes, consider being tested at a younger age or more often.  A one-time screening for abdominal aortic aneurysm (AAA) by ultrasound is recommended for men aged 17-75 years who are current or former smokers. What should I know about preventing infection? Hepatitis B If you have a higher risk for hepatitis B, you should be screened for this virus. Talk with your health care provider to find out if you are at risk for hepatitis B infection. Hepatitis C Blood testing is recommended for:  Everyone born from [birth year] through 1965.  Anyone with known risk factors for hepatitis  C.  Sexually Transmitted Diseases (STDs)  You should be screened each year for STDs including gonorrhea and chlamydia if: ? You are sexually active and are younger than 62 years of age. ? You are older than 62 years of age and your health care provider tells you that you are at risk for this type of infection. ? Your sexual activity has changed since you were last screened and you are at an increased risk for chlamydia or gonorrhea. Ask your health care provider if you are at risk.  Talk with your health care provider about whether you are at high risk of being infected with HIV. Your health care provider may recommend a prescription medicine to help prevent HIV infection.  What else can I do?  Schedule regular health, dental, and eye exams.  Stay current with your vaccines (immunizations).  Do not use  any tobacco products, such as cigarettes, chewing tobacco, and e-cigarettes. If you need help quitting, ask your health care provider.  Limit alcohol intake to no more than 2 drinks per day. One drink equals 12 ounces of beer, 5 ounces of wine, or 1 ounces of hard liquor.  Do not use street drugs.  Do not share needles.  Ask your health care provider for help if you need support or information about quitting drugs.  Tell your health care provider if you often feel depressed.  Tell your health care provider if you have ever been abused or do not feel safe at home. This information is not intended to replace advice given to you by your health care provider. Make sure you discuss any questions you have with your health care provider. Document Released: 08/09/2007 Document Revised: 10/10/2015 Document Reviewed: 11/14/2014 Elsevier Interactive Patient Education  [employer].

## 2017-10-09 NOTE — Progress Notes (Addendum)
Subjective:   Scott [name] is a 62 y.o. male who presents for Medicare Annual/Subsequent preventive examination.  Review of Systems:  No ROS.  Medicare Wellness Visit. Additional risk factors are reflected in the social history.  Cardiac Risk Factors include: advanced age (>79men, >69 women);dyslipidemia;hypertension;male gender Sleep patterns: gets up 1-2 times nightly to void and sleeps 6-8 hours nightly.    Home Safety/Smoke Alarms: Feels safe in home. Smoke alarms in place.  Living environment; residence and Firearm Safety: 1-story house/ trailer, no firearms Lives alone, no needs for DME, good support system. Seat Belt Safety/Bike Helmet: Wears seat belt.   PSA-  Lab Results  Component Value Date   PSA 1.64 08/28/2017   PSA 0.72 05/15/2016   PSA 0.98 05/08/2015       Objective:    Vitals: BP (!) 144/62   Pulse 65   Resp 17   Ht 5\' 6"  (1.676 m)   Wt 187 lb (84.8 kg)   SpO2 99%   BMI 30.18 kg/m   Body mass index is 30.18 kg/m.  Advanced Directives 10/09/2017 06/20/2016 06/13/2016 03/21/2016 12/14/2013 07/06/2012 12/08/2011  Does Patient Have a Medical Advance Directive? Yes Yes Yes Yes No Patient has advance directive, copy not in chart Patient has advance directive, copy not in chart  Type of Advance Directive [place];Living will [place];Living will [place];Living will [place];Living will - [place];Living will Living will  Does patient want to make changes to medical advance directive? - No - Patient declined - - - - -  Copy of [place] in Chart? No - copy requested No - copy requested Yes No - copy requested - - -  Would patient like information on creating a medical advance directive? - - - - No - patient declined information - -  Pre-existing out of facility DNR order (yellow form or pink MOST form) - - - - - No No    Tobacco Social History    Tobacco Use  Smoking Status Former Smoker  . Packs/day: 1.50  . Years: 26.00  . Pack years: 39.00  . Types: Cigarettes  . Last attempt to quit: 04/06/1995  . Years since quitting: 22.5  Smokeless Tobacco Never Used     Counseling given: Not Answered  Past Medical History:  Diagnosis Date  . ALLERGIC RHINITIS 01/14/2007  . ANXIETY 09/28/2006  . Asbestos exposure   . Atrial fibrillation ([place]) 09/28/2006   s/p afib ablation x 2  . CHF (congestive heart failure) ([place])   . COPD (chronic obstructive pulmonary disease) ([place])   . Coronary artery disease 11/24/2011  . Diastolic dysfunction 10/30/930  . Dyspnea    W/ EXERTION   . Dysrhythmia    AFIB     . Gallstones   . HYPERLIPIDEMIA 09/28/2006  . HYPERTENSION 09/28/2006  . HYPOTHYROIDISM, ACQUIRED NEC 09/28/2006   pt denies and doesn't know  . Long term current use of anticoagulant 04/08/2010  . OBSTRUCTIVE SLEEP APNEA 01/06/2008   NOT USING    . Right knee DJD 07/09/2010  . S/P CABG x 1 11/26/2011   Right internal mammary artery to right coronary artery  . S/P Maze operation for atrial fibrillation 11/26/2011   complete biatrial lesion set with clipping of LA appendage   Past Surgical History:  Procedure Laterality Date  . ANTERIOR CERVICAL DECOMP/DISCECTOMY FUSION N/A 06/20/2016   Procedure: Anterior Cervical Discectomy Fusion - Cervical five-Cervical  six - Cervical six-Cervical seven - Cervicla seven- Thoracic one;  Surgeon: Earnie Larsson, MD;  Location: [place];  Service: Neurosurgery;  Laterality: N/A;  . CARDIOVERSION     X4   . CERVICAL FUSION  06/20/2016   C5-6, C6-7, C7-T1 anterior cervical discectomy with interbody fusion utilizing interbody peek cages, locally harvested autograft, and anterior plate instrumentation  . CORONARY ARTERY BYPASS GRAFT  11/26/2011   Procedure: CORONARY ARTERY BYPASS GRAFTING (CABG);  Surgeon: Rexene Alberts, MD;  Location: [place];  Service: Open Heart Surgery;  Laterality: N/A;  coronary artery bypass on  pump times one utilizing the right internal mammary artery, transesophageal echocardiogram   . hand surgury Left   . LEFT HEART CATHETERIZATION WITH CORONARY ANGIOGRAM N/A 11/24/2011   Procedure: LEFT HEART CATHETERIZATION WITH CORONARY ANGIOGRAM;  Surgeon: Peter M Martinique, MD;  Location[facility] C[facility] LAB;  Service: Cardiovascular;  Laterality: N/A;  . LOOP RECORDER IMPLANT N/A 07/06/2012   Procedure: LOOP RECORDER IMPLANT;  Surgeon: Thompson Grayer, MD;  Location: [facility] CATH LAB;  Service: Cardiovascular;  Laterality: N/A;  . MAZE  11/26/2011   Procedure: MAZE;  Surgeon: Rexene Alberts, MD;  Location: [place];  Service: Open Heart Surgery;  Laterality: N/A;  Cryomaze   . Radiofrequency Ablation for atrial fibrillation  12/30/07 and 06/05/09   afib ablation x 2 by JA  . Radiofrequency ablation for atrial flutter  2005   CTI   Family History  Problem Relation Age of Onset  . Dementia Father   . Hypertension Father   . Atrial fibrillation Mother   . Stroke Mother   . Dementia Paternal Grandfather   . Cancer Paternal Grandmother        type unknown   Social History   Socioeconomic History  . Marital status: Single    Spouse name: Not on file  . Number of children: 0  . Years of education: Not on file  . Highest education level: Not on file  Occupational History  . Occupation: worked previously in the [employer] and feels that he was exposed to asbestos.    Employer: [employer]  Social Needs  . Financial resource strain: Not hard at all  . Food insecurity:    Worry: Never true    Inability: Never true  . Transportation needs:    Medical: No    Non-medical: No  Tobacco Use  . Smoking status: Former Smoker    Packs/day: 1.50    Years: 26.00    Pack years: 39.00    Types: Cigarettes    Last attempt to quit: 04/06/1995    Years since quitting: 22.5  . Smokeless tobacco: Never Used  Substance and Sexual Activity  . Alcohol use: No    Alcohol/week: 0.0 standard drinks  . Drug  use: No  . Sexual activity: Yes  Lifestyle  . Physical activity:    Days per week: 4 days    Minutes per session: 50 min  . Stress: Not at all  Relationships  . Social connections:    Talks on phone: More than three times a week    Gets together: More than three times a week    Attends religious service: Never    Active member of club or organization: Yes    Attends meetings of clubs or organizations: More than 4 times per year    Relationship status: Not on file  Other Topics Concern  . Not on file  Social History Narrative  Pt lives in [place], [place] with his friend.     Outpatient Encounter Medications as of 10/09/2017  Medication Sig  . acetaminophen (TYLENOL) 500 MG tablet Take 1,000 mg by mouth every 6 (six) hours as needed for mild pain.   Marland Kitchen albuterol (PROVENTIL HFA;VENTOLIN HFA) 108 (90 Base) MCG/ACT inhaler Inhale 2 puffs into the lungs every 6 (six) hours as needed for wheezing or shortness of breath.  . ALPRAZolam (XANAX) 0.5 MG tablet TAKE 1 TABLET BY MOUTH TWICE A DAY AS NEEDED FOR ANXIETY  . budesonide-formoterol (SYMBICORT) 160-4.5 MCG/ACT inhaler INHALE 2 PUFFS INTO THE LUNGS 2 (TWO) TIMES DAILY.  . cholecalciferol (VITAMIN D) 1000 units tablet Take 1,000 Units by mouth daily.  Marland Kitchen docusate sodium (COLACE) 100 MG capsule Take 100 mg by mouth daily as needed for mild constipation.  Marland Kitchen ELIQUIS 5 MG TABS tablet TAKE 1 TABLET BY MOUTH 2 TIMES DAILY.  Marland Kitchen lovastatin (MEVACOR) 40 MG tablet TAKE 2 TABLETS (80 MG TOTAL) BY MOUTH EVERY EVENING.  . metoprolol succinate (TOPROL-XL) 50 MG 24 hr tablet TAKE ONE AND ONE HALF TABLETS BY MOUTH DAILY  . Multiple Vitamin (MULTIVITAMIN WITH MINERALS) TABS tablet Take 1 tablet by mouth daily.  . Olmesartan-amLODIPine-HCTZ 40-5-12.5 MG TABS TAKE 1 TABLET BY MOUTH EVERY DAY  . temazepam (RESTORIL) 30 MG capsule TAKE 1 CAPSULE BY MOUTH AT BEDTIME AS NEEDED FOR SLEEP  . traMADol (ULTRAM) 50 MG tablet TAKE 1 TABLET BY MOUTH EVERY 6 HOURS AS  NEEDED.   No facility-administered encounter medications on file as of 10/09/2017.     Activities of Daily Living In your present state of health, do you have any difficulty performing the following activities: 10/09/2017  Hearing? N  Vision? N  Difficulty concentrating or making decisions? N  Walking or climbing stairs? N  Dressing or bathing? N  Doing errands, shopping? N  Preparing Food and eating ? N  Using the Toilet? N  In the past six months, have you accidently leaked urine? N  Do you have problems with loss of bowel control? N  Managing your Medications? N  Managing your Finances? N  Housekeeping or managing your Housekeeping? N  Some recent data might be hidden    Patient Care Team: [name], MD as PCP - General Jacqualyn Posey Bonna Gains, DPM as Consulting Physician (Podiatry) Evans Lance, MD as Consulting Physician (Cardiology) Irene Shipper, MD as Consulting Physician (Gastroenterology)   Assessment:   This is a routine wellness examination for [place]. Physical assessment deferred to PCP.   Exercise Activities and Dietary recommendations Current Exercise Habits: Home exercise routine, Type of exercise: walking, Time (Minutes): 60, Frequency (Times/Week): 5, Weekly Exercise (Minutes/Week): 300, Intensity: Mild, Exercise limited by: orthopedic condition(s)  Diet (meal preparation, eat out, water intake, caffeinated beverages, dairy products, fruits and vegetables): in general, a "healthy" diet  , well balanced eats a variety of fruits and vegetables daily, limits salt, fat/cholesterol, sugar,carbohydrates,caffeine, drinks 6-8 glasses of water daily.  Goals    . Patient Stated     Maintain current health status.    . Weight (lb) < 155 lb (70.3 kg)       Fall Risk Fall Risk  10/09/2017 08/28/2017 02/27/2017 11/11/2016 10/13/2016  Falls in the past year? Yes Yes Yes Yes Yes  Number falls in past yr: 1 1 2  or more - -  Injury with Fall? No No No - -  Risk Factor  Category  - - - - -  Comment - - - - -  Risk for fall due to : Impaired mobility;Impaired balance/gait - - - -  Follow up Falls prevention discussed - - - -    Depression Screen PHQ 2/9 Scores 10/09/2017 02/27/2017 11/11/2016 10/13/2016  PHQ - 2 Score 0 0 0 0    Cognitive Function MMSE - Mini Mental State Exam 10/09/2017 03/21/2016  Not completed: Refused -  Orientation to time - 4  Orientation to Place - 5  Registration - 3  Attention/ Calculation - 5  Recall - 2  Language- name 2 objects - 2  Language- repeat - 1  Language- follow 3 step command - 3  Language- read & follow direction - 1  Write a sentence - 1  Copy design - 1  Total score - 28       Ad8 score reviewed for issues:  Issues making decisions: no  Less interest in hobbies / activities: no  Repeats questions, stories (family complaining): no  Trouble using ordinary gadgets (microwave, computer, phone):no  Forgets the month or year: no  Mismanaging finances: no  Remembering appts: no  Daily problems with thinking and/or memory: no Ad8 score is= 0   Immunization History  Administered Date(s) Administered  . Influenza Split 11/25/2011  . Influenza Whole 01/13/2005, 11/26/2007, 12/06/2008, 11/29/2009  . Influenza,inj,Quad PF,6+ Mos 03/08/2015  . Influenza-Unspecified 10/25/2012, 11/21/2015, 11/25/2015, 12/25/2016  . Pneumococcal Conjugate-13 12/21/2015  . Pneumococcal Polysaccharide-23 03/11/2011  . Td 03/27/2004, 04/04/2014  . Zoster 01/04/2016   Screening Tests Health Maintenance  Topic Date Due  . INFLUENZA VACCINE  05/29/2018 (Originally 09/24/2017)  . COLONOSCOPY  10/10/2018 (Originally 04/05/2005)  . TETANUS/TDAP  04/04/2024  . Hepatitis C Screening  Completed  . HIV Screening  Completed      Plan:     Continue doing brain stimulating activities (puzzles, reading, adult coloring books, staying active) to keep memory sharp.   Continue to eat heart healthy diet (full of fruits, vegetables,  whole grains, lean protein, water--limit salt, fat, and sugar intake) and increase physical activity as tolerated.  I have personally reviewed and noted the following in the patient's chart:   . Medical and social history . Use of alcohol, tobacco or illicit drugs  . Current medications and supplements . Functional ability and status . Nutritional status . Physical activity . Advanced directives . List of other physicians . Vitals . Screenings to include cognitive, depression, and falls . Referrals and appointments  In addition, I have reviewed and discussed with patient certain preventive protocols, quality metrics, and best practice recommendations. A written personalized care plan for preventive services as well as general preventive health recommendations were provided to patient.     Michiel Cowboy, RN  10/09/2017  Medical screening examination/treatment/procedure(s) were performed by non-physician practitioner and as supervising physician I was immediately available for consultation/collaboration. I agree with above. Cathlean Cower, MD

## 2017-10-09 NOTE — Telephone Encounter (Signed)
During AWV, patient stated he looked back at the results of his last CXR on 02/27/17 and would like an explanation of the result, especially chronic interstitial lung thickening.

## 2017-10-09 NOTE — Telephone Encounter (Signed)
Unfortunately I am not able to do phone consultations due to the extreme pressures we undergo from employer expectations  We can d/w pt at next visit

## 2017-10-11 ENCOUNTER — Other Ambulatory Visit: Payer: Self-pay | Admitting: Internal Medicine

## 2017-10-16 ENCOUNTER — Ambulatory Visit (INDEPENDENT_AMBULATORY_CARE_PROVIDER_SITE_OTHER): Payer: PPO | Admitting: Internal Medicine

## 2017-10-16 ENCOUNTER — Encounter: Payer: Self-pay | Admitting: Internal Medicine

## 2017-10-16 VITALS — BP 138/74 | HR 77 | Ht 66.0 in | Wt 186.4 lb

## 2017-10-16 DIAGNOSIS — I480 Paroxysmal atrial fibrillation: Secondary | ICD-10-CM | POA: Diagnosis not present

## 2017-10-16 DIAGNOSIS — I10 Essential (primary) hypertension: Secondary | ICD-10-CM

## 2017-10-16 DIAGNOSIS — I5032 Chronic diastolic (congestive) heart failure: Secondary | ICD-10-CM

## 2017-10-16 DIAGNOSIS — Z951 Presence of aortocoronary bypass graft: Secondary | ICD-10-CM | POA: Diagnosis not present

## 2017-10-16 NOTE — Progress Notes (Signed)
HPI [name] returns today for followup. He is a pleasant middle aged man with multiple medical problems including PAF, HTN, and remote tachy induced CM. In the interim, he has done well. He is walking his dogs 5-7 miles a day! No chest pain or sob. No edema. Allergies  Allergen Reactions  . Zolpidem Tartrate Other (See Comments)    Extremely drowsy the next day     Current Outpatient Medications  Medication Sig Dispense Refill  . acetaminophen (TYLENOL) 500 MG tablet Take 1,000 mg by mouth every 6 (six) hours as needed for mild pain.     Marland Kitchen albuterol (PROVENTIL HFA;VENTOLIN HFA) 108 (90 Base) MCG/ACT inhaler Inhale 2 puffs into the lungs every 6 (six) hours as needed for wheezing or shortness of breath. 1 Inhaler 11  . ALPRAZolam (XANAX) 0.5 MG tablet TAKE 1 TABLET BY MOUTH TWICE A DAY AS NEEDED FOR ANXIETY 180 tablet 1  . budesonide-formoterol (SYMBICORT) 160-4.5 MCG/ACT inhaler INHALE 2 PUFFS INTO THE LUNGS 2 (TWO) TIMES DAILY. 30.6 Inhaler 4  . cholecalciferol (VITAMIN D) 1000 units tablet Take 1,000 Units by mouth daily.    Marland Kitchen docusate sodium (COLACE) 100 MG capsule Take 100 mg by mouth daily as needed for mild constipation.    Marland Kitchen ELIQUIS 5 MG TABS tablet TAKE 1 TABLET BY MOUTH 2 TIMES DAILY. 60 tablet 4  . lovastatin (MEVACOR) 40 MG tablet TAKE 2 TABLETS (80 MG TOTAL) BY MOUTH EVERY EVENING. 180 tablet 2  . metoprolol succinate (TOPROL-XL) 50 MG 24 hr tablet TAKE ONE AND ONE HALF TABLETS BY MOUTH DAILY 135 tablet 1  . Multiple Vitamin (MULTIVITAMIN WITH MINERALS) TABS tablet Take 1 tablet by mouth daily.    . Olmesartan-amLODIPine-HCTZ 40-5-12.5 MG TABS TAKE 1 TABLET BY MOUTH EVERY DAY 90 tablet 3  . temazepam (RESTORIL) 30 MG capsule TAKE 1 CAPSULE BY MOUTH AT BEDTIME AS NEEDED FOR SLEEP 90 capsule 1  . traMADol (ULTRAM) 50 MG tablet TAKE 1 TABLET BY MOUTH EVERY 6 HOURS AS NEEDED. 120 tablet 2   No current facility-administered medications for this visit.      Past Medical  History:  Diagnosis Date  . ALLERGIC RHINITIS 01/14/2007  . ANXIETY 09/28/2006  . Asbestos exposure   . Atrial fibrillation ([place]) 09/28/2006   s/p afib ablation x 2  . CHF (congestive heart failure) ([place])   . COPD (chronic obstructive pulmonary disease) ([place])   . Coronary artery disease 11/24/2011  . Diastolic dysfunction 07/27/2295  . Dyspnea    W/ EXERTION   . Dysrhythmia    AFIB     . Gallstones   . HYPERLIPIDEMIA 09/28/2006  . HYPERTENSION 09/28/2006  . HYPOTHYROIDISM, ACQUIRED NEC 09/28/2006   pt denies and doesn't know  . Long term current use of anticoagulant 04/08/2010  . OBSTRUCTIVE SLEEP APNEA 01/06/2008   NOT USING    . Right knee DJD 07/09/2010  . S/P CABG x 1 11/26/2011   Right internal mammary artery to right coronary artery  . S/P Maze operation for atrial fibrillation 11/26/2011   complete biatrial lesion set with clipping of LA appendage    ROS:   All systems reviewed and negative except as noted in the HPI.   Past Surgical History:  Procedure Laterality Date  . ANTERIOR CERVICAL DECOMP/DISCECTOMY FUSION N/A 06/20/2016   Procedure: Anterior Cervical Discectomy Fusion - Cervical five-Cervical six - Cervical six-Cervical seven - Cervicla seven- Thoracic one;  Surgeon: Earnie Larsson, MD;  Location: [place];  Service: Neurosurgery;  Laterality: N/A;  . CARDIOVERSION     X4   . CERVICAL FUSION  06/20/2016   C5-6, C6-7, C7-T1 anterior cervical discectomy with interbody fusion utilizing interbody peek cages, locally harvested autograft, and anterior plate instrumentation  . CORONARY ARTERY BYPASS GRAFT  11/26/2011   Procedure: CORONARY ARTERY BYPASS GRAFTING (CABG);  Surgeon: Rexene Alberts, MD;  Location: [place];  Service: Open Heart Surgery;  Laterality: N/A;  coronary artery bypass on pump times one utilizing the right internal mammary artery, transesophageal echocardiogram   . hand surgury Left   . LEFT HEART CATHETERIZATION WITH CORONARY ANGIOGRAM N/A 11/24/2011   Procedure: LEFT  HEART CATHETERIZATION WITH CORONARY ANGIOGRAM;  Surgeon: Peter M Martinique, MD;  Location: [facility] CATH LAB;  Service: Cardiovascular;  Laterality: N/A;  . LOOP RECORDER IMPLANT N/A 07/06/2012   Procedure: LOOP RECORDER IMPLANT;  Surgeon: Thompson Grayer, MD;  Location: [facility] CATH LAB;  Service: Cardiovascular;  Laterality: N/A;  . MAZE  11/26/2011   Procedure: MAZE;  Surgeon: Rexene Alberts, MD;  Location: [place];  Service: Open Heart Surgery;  Laterality: N/A;  Cryomaze   . Radiofrequency Ablation for atrial fibrillation  12/30/07 and 06/05/09   afib ablation x 2 by JA  . Radiofrequency ablation for atrial flutter  2005   CTI     Family History  Problem Relation Age of Onset  . Dementia Father   . Hypertension Father   . Atrial fibrillation Mother   . Stroke Mother   . Dementia Paternal Grandfather   . Cancer Paternal Grandmother        type unknown     Social History   Socioeconomic History  . Marital status: Single    Spouse name: Not on file  . Number of children: 0  . Years of education: Not on file  . Highest education level: Not on file  Occupational History  . Occupation: worked previously in the [employer] and feels that he was exposed to asbestos.    Employer: [employer]  Social Needs  . Financial resource strain: Not hard at all  . Food insecurity:    Worry: Never true    Inability: Never true  . Transportation needs:    Medical: No    Non-medical: No  Tobacco Use  . Smoking status: Former Smoker    Packs/day: 1.50    Years: 26.00    Pack years: 39.00    Types: Cigarettes    Last attempt to quit: 04/06/1995    Years since quitting: 22.5  . Smokeless tobacco: Never Used  Substance and Sexual Activity  . Alcohol use: No    Alcohol/week: 0.0 standard drinks  . Drug use: No  . Sexual activity: Yes  Lifestyle  . Physical activity:    Days per week: 4 days    Minutes per session: 50 min  . Stress: Not at all  Relationships  . Social connections:     Talks on phone: More than three times a week    Gets together: More than three times a week    Attends religious service: Never    Active member of club or organization: Yes    Attends meetings of clubs or organizations: More than 4 times per year    Relationship status: Not on file  . Intimate partner violence:    Fear of current or ex partner: Not on file    Emotionally abused: Not on file    Physically  abused: Not on file    Forced sexual activity: Not on file  Other Topics Concern  . Not on file  Social History Narrative   Pt lives in [place], [place] with his friend.      BP 138/74   Pulse 77   Ht 5\' 6"  (1.676 m)   Wt 186 lb 6.4 oz (84.6 kg)   SpO2 99%   BMI 30.09 kg/m   Physical Exam:  Well appearing 62 yo man, NAD HEENT: Unremarkable Neck:  6 cm JVD, no thyromegally Lymphatics:  No adenopathy Back:  No CVA tenderness Lungs:  Clear with no wheezes HEART:  Regular rate rhythm, no murmurs, no rubs, no clicks Abd:  soft, positive bowel sounds, no organomegally, no rebound, no guarding Ext:  2 plus pulses, no edema, no cyanosis, no clubbing Skin:  No rashes no nodules Neuro:  CN II through XII intact, motor grossly intact  EKG - NSR   Assess/Plan: 1. PAF - he is maintaining NSR very nicely. 2. HTN - his blood pressure is well controlled. 3. Chronic systolic/diastolic heart failure - his symptoms are class 1. His EF has normalized. He will continue his current meds. He admits to sodium indiscretion.   Mikle Bosworth.D.

## 2017-10-16 NOTE — Patient Instructions (Addendum)
Medication Instructions:  Your physician recommends that you continue on your current medications as directed. Please refer to the Current Medication list given to you today.  Labwork: None ordered.  Testing/Procedures: None ordered.  Follow-Up: Your physician wants you to follow-up in: two year with Dr. Lovena Le.   You will receive a reminder letter in the mail two months in advance. If you don't receive a letter, please call our office to schedule the follow-up appointment.  Any Other Special Instructions Will Be Listed Below (If Applicable).  If you need a refill on your cardiac medications before your next appointment, please call your pharmacy.

## 2017-10-17 ENCOUNTER — Other Ambulatory Visit: Payer: Self-pay | Admitting: Internal Medicine

## 2017-10-20 ENCOUNTER — Other Ambulatory Visit: Payer: Self-pay | Admitting: Internal Medicine

## 2017-10-20 NOTE — Telephone Encounter (Signed)
Done erx 

## 2017-10-27 ENCOUNTER — Ambulatory Visit (INDEPENDENT_AMBULATORY_CARE_PROVIDER_SITE_OTHER): Payer: PPO | Admitting: Podiatry

## 2017-10-27 ENCOUNTER — Encounter: Payer: Self-pay | Admitting: Podiatry

## 2017-10-27 DIAGNOSIS — M722 Plantar fascial fibromatosis: Secondary | ICD-10-CM | POA: Diagnosis not present

## 2017-10-27 MED ORDER — TRIAMCINOLONE ACETONIDE 10 MG/ML IJ SUSP
10.0000 mg | Freq: Once | INTRAMUSCULAR | Status: AC
  Administered 2017-10-27: 10 mg

## 2017-10-27 NOTE — Patient Instructions (Signed)
You will likely benefit form orthotics/inserts in your shoes. You can go to the "[employer]" store to look at getting these. If you have any issues with that please let me know!  Ice your foot tonight to help prevent any increase in pain from the steroid injection  Please let me know if you have any questions or concerns.   ----   Plantar Fasciitis (Heel Spur Syndrome) with Rehab The plantar fascia is a fibrous, ligament-like, soft-tissue structure that spans the bottom of the foot. Plantar fasciitis is a condition that causes pain in the foot due to inflammation of the tissue. SYMPTOMS   Pain and tenderness on the underneath side of the foot.  Pain that worsens with standing or walking. CAUSES  Plantar fasciitis is caused by irritation and injury to the plantar fascia on the underneath side of the foot. Common mechanisms of injury include:  Direct trauma to bottom of the foot.  Damage to a small nerve that runs under the foot where the main fascia attaches to the heel bone.  Stress placed on the plantar fascia due to bone spurs. RISK INCREASES WITH:   Activities that place stress on the plantar fascia (running, jumping, pivoting, or cutting).  Poor strength and flexibility.  Improperly fitted shoes.  Tight calf muscles.  Flat feet.  Failure to warm-up properly before activity.  Obesity. PREVENTION  Warm up and stretch properly before activity.  Allow for adequate recovery between workouts.  Maintain physical fitness:  Strength, flexibility, and endurance.  Cardiovascular fitness.  Maintain a health body weight.  Avoid stress on the plantar fascia.  Wear properly fitted shoes, including arch supports for individuals who have flat feet.  PROGNOSIS  If treated properly, then the symptoms of plantar fasciitis usually resolve without surgery. However, occasionally surgery is necessary.  RELATED COMPLICATIONS   Recurrent symptoms that may result in a chronic  condition.  Problems of the lower back that are caused by compensating for the injury, such as limping.  Pain or weakness of the foot during push-off following surgery.  Chronic inflammation, scarring, and partial or complete fascia tear, occurring more often from repeated injections.  TREATMENT  Treatment initially involves the use of ice and medication to help reduce pain and inflammation. The use of strengthening and stretching exercises may help reduce pain with activity, especially stretches of the Achilles tendon. These exercises may be performed at home or with a therapist. Your caregiver may recommend that you use heel cups of arch supports to help reduce stress on the plantar fascia. Occasionally, corticosteroid injections are given to reduce inflammation. If symptoms persist for greater than 6 months despite non-surgical (conservative), then surgery may be recommended.   MEDICATION   If pain medication is necessary, then nonsteroidal anti-inflammatory medications, such as aspirin and ibuprofen, or other minor pain relievers, such as acetaminophen, are often recommended.  Do not take pain medication within 7 days before surgery.  Prescription pain relievers may be given if deemed necessary by your caregiver. Use only as directed and only as much as you need.  Corticosteroid injections may be given by your caregiver. These injections should be reserved for the most serious cases, because they may only be given a certain number of times.  HEAT AND COLD  Cold treatment (icing) relieves pain and reduces inflammation. Cold treatment should be applied for 10 to 15 minutes every 2 to 3 hours for inflammation and pain and immediately after any activity that aggravates your symptoms. Use ice  packs or massage the area with a piece of ice (ice massage).  Heat treatment may be used prior to performing the stretching and strengthening activities prescribed by your caregiver, physical therapist,  or athletic trainer. Use a heat pack or soak the injury in warm water.  SEEK IMMEDIATE MEDICAL CARE IF:  Treatment seems to offer no benefit, or the condition worsens.  Any medications produce adverse side effects.  EXERCISES- RANGE OF MOTION (ROM) AND STRETCHING EXERCISES - Plantar Fasciitis (Heel Spur Syndrome) These exercises may help you when beginning to rehabilitate your injury. Your symptoms may resolve with or without further involvement from your physician, physical therapist or athletic trainer. While completing these exercises, remember:   Restoring tissue flexibility helps normal motion to return to the joints. This allows healthier, less painful movement and activity.  An effective stretch should be held for at least 30 seconds.  A stretch should never be painful. You should only feel a gentle lengthening or release in the stretched tissue.  RANGE OF MOTION - Toe Extension, Flexion  Sit with your right / left leg crossed over your opposite knee.  Grasp your toes and gently pull them back toward the top of your foot. You should feel a stretch on the bottom of your toes and/or foot.  Hold this stretch for 10 seconds.  Now, gently pull your toes toward the bottom of your foot. You should feel a stretch on the top of your toes and or foot.  Hold this stretch for 10 seconds. Repeat  times. Complete this stretch 3 times per day.   RANGE OF MOTION - Ankle Dorsiflexion, Active Assisted  Remove shoes and sit on a chair that is preferably not on a carpeted surface.  Place right / left foot under knee. Extend your opposite leg for support.  Keeping your heel down, slide your right / left foot back toward the chair until you feel a stretch at your ankle or calf. If you do not feel a stretch, slide your bottom forward to the edge of the chair, while still keeping your heel down.  Hold this stretch for 10 seconds. Repeat 3 times. Complete this stretch 2 times per day.    STRETCH  Gastroc, Standing  Place hands on wall.  Extend right / left leg, keeping the front knee somewhat bent.  Slightly point your toes inward on your back foot.  Keeping your right / left heel on the floor and your knee straight, shift your weight toward the wall, not allowing your back to arch.  You should feel a gentle stretch in the right / left calf. Hold this position for 10 seconds. Repeat 3 times. Complete this stretch 2 times per day.  STRETCH  Soleus, Standing  Place hands on wall.  Extend right / left leg, keeping the other knee somewhat bent.  Slightly point your toes inward on your back foot.  Keep your right / left heel on the floor, bend your back knee, and slightly shift your weight over the back leg so that you feel a gentle stretch deep in your back calf.  Hold this position for 10 seconds. Repeat 3 times. Complete this stretch 2 times per day.  STRETCH  Gastrocsoleus, Standing  Note: This exercise can place a lot of stress on your foot and ankle. Please complete this exercise only if specifically instructed by your caregiver.   Place the ball of your right / left foot on a step, keeping your other foot firmly on  the same step.  Hold on to the wall or a rail for balance.  Slowly lift your other foot, allowing your body weight to press your heel down over the edge of the step.  You should feel a stretch in your right / left calf.  Hold this position for 10 seconds.  Repeat this exercise with a slight bend in your right / left knee. Repeat 3 times. Complete this stretch 2 times per day.   STRENGTHENING EXERCISES - Plantar Fasciitis (Heel Spur Syndrome)  These exercises may help you when beginning to rehabilitate your injury. They may resolve your symptoms with or without further involvement from your physician, physical therapist or athletic trainer. While completing these exercises, remember:   Muscles can gain both the endurance and the strength  needed for everyday activities through controlled exercises.  Complete these exercises as instructed by your physician, physical therapist or athletic trainer. Progress the resistance and repetitions only as guided.  STRENGTH - Towel Curls  Sit in a chair positioned on a non-carpeted surface.  Place your foot on a towel, keeping your heel on the floor.  Pull the towel toward your heel by only curling your toes. Keep your heel on the floor. Repeat 3 times. Complete this exercise 2 times per day.  STRENGTH - Ankle Inversion  Secure one end of a rubber exercise band/tubing to a fixed object (table, pole). Loop the other end around your foot just before your toes.  Place your fists between your knees. This will focus your strengthening at your ankle.  Slowly, pull your big toe up and in, making sure the band/tubing is positioned to resist the entire motion.  Hold this position for 10 seconds.  Have your muscles resist the band/tubing as it slowly pulls your foot back to the starting position. Repeat 3 times. Complete this exercises 2 times per day.  Document Released: 02/10/2005 Document Revised: 05/05/2011 Document Reviewed: 05/25/2008 [facility] Patient Information 2014 [place], [place].

## 2017-11-02 NOTE — Progress Notes (Signed)
Subjective: 62 year old male presents the office today for evaluation of left heel pain, plantar fasciitis.  States his pain is somewhat better but overall he has not had much improvement.  He has not yet purchased the inserts either.  He is been stretching icing intermittently.  No recent injury or trauma or any changes. Denies any systemic complaints such as fevers, chills, nausea, vomiting. No acute changes since last appointment, and no other complaints at this time.   Objective: AAO x3, NAD DP/PT pulses palpable bilaterally, CRT less than 3 seconds There is continuation of tenderness to palpation on plantar medial tubercle of the calcaneus at the insertion of plantar fashion the left foot.  The fascia appears to be intact as well as Achilles tendon.  No pain on the arch of the foot.  Negative Tinel sign.  No area pinpoint tenderness. No open lesions or pre-ulcerative lesions.  No pain with calf compression, swelling, warmth, erythema  Assessment: Left foot plantar fasciitis  Plan: -All treatment options discussed with the patient including all alternatives, risks, complications.  -Today we discussed steroid injection he wished to proceed.  See procedure note below.  Continue stretching, icing exercises daily as well as a plantar fascial brace.  Also discussed inserts in his clinic get this Prilosec and over-the-counter insert we discussed somewhat the purchase and look for. -Patient encouraged to call the office with any questions, concerns, change in symptoms.   Procedure: Injection Tendon/Ligament Discussed alternatives, risks, complications and verbal consent was obtained.  Location: Left plantar fascia at the glabrous junction; medial approach. Skin Prep: Alcohol. Injectate: 0.5cc 0.5% marcaine plain, 0.5 cc 2% lidocaine plain and, 1 cc kenalog 10. Disposition: Patient tolerated procedure well. Injection site dressed with a band-aid.  Post-injection care was discussed and return  precautions discussed.   Trula Slade DPM

## 2017-11-10 ENCOUNTER — Other Ambulatory Visit: Payer: Self-pay | Admitting: Internal Medicine

## 2017-11-21 ENCOUNTER — Other Ambulatory Visit: Payer: Self-pay | Admitting: Internal Medicine

## 2017-11-23 NOTE — Telephone Encounter (Signed)
Done erx 

## 2017-11-24 ENCOUNTER — Other Ambulatory Visit: Payer: Self-pay | Admitting: Internal Medicine

## 2017-11-25 NOTE — Telephone Encounter (Signed)
Done erx 

## 2017-12-29 ENCOUNTER — Other Ambulatory Visit: Payer: Self-pay | Admitting: Internal Medicine

## 2017-12-30 NOTE — Telephone Encounter (Signed)
MD approved and sent electronically to pof../lmb  

## 2017-12-30 NOTE — Telephone Encounter (Signed)
Done erx 

## 2018-01-20 ENCOUNTER — Other Ambulatory Visit: Payer: Self-pay | Admitting: Internal Medicine

## 2018-03-05 ENCOUNTER — Ambulatory Visit (INDEPENDENT_AMBULATORY_CARE_PROVIDER_SITE_OTHER): Payer: PPO | Admitting: Internal Medicine

## 2018-03-05 ENCOUNTER — Other Ambulatory Visit (INDEPENDENT_AMBULATORY_CARE_PROVIDER_SITE_OTHER): Payer: PPO

## 2018-03-05 ENCOUNTER — Encounter: Payer: Self-pay | Admitting: Internal Medicine

## 2018-03-05 VITALS — BP 126/84 | HR 77 | Temp 98.3°F | Ht 66.0 in | Wt 192.0 lb

## 2018-03-05 DIAGNOSIS — R739 Hyperglycemia, unspecified: Secondary | ICD-10-CM

## 2018-03-05 DIAGNOSIS — J449 Chronic obstructive pulmonary disease, unspecified: Secondary | ICD-10-CM

## 2018-03-05 DIAGNOSIS — E785 Hyperlipidemia, unspecified: Secondary | ICD-10-CM

## 2018-03-05 DIAGNOSIS — Z0000 Encounter for general adult medical examination without abnormal findings: Secondary | ICD-10-CM

## 2018-03-05 DIAGNOSIS — I10 Essential (primary) hypertension: Secondary | ICD-10-CM

## 2018-03-05 DIAGNOSIS — M5412 Radiculopathy, cervical region: Secondary | ICD-10-CM

## 2018-03-05 LAB — BASIC METABOLIC PANEL
BUN: 20 mg/dL (ref 6–23)
CALCIUM: 9.7 mg/dL (ref 8.4–10.5)
CO2: 29 mEq/L (ref 19–32)
Chloride: 106 mEq/L (ref 96–112)
Creatinine, Ser: 1.17 mg/dL (ref 0.40–1.50)
GFR: 66.94 mL/min (ref >60.00)
Glucose, Bld: 104 mg/dL — ABNORMAL HIGH (ref 70–99)
Potassium: 4.5 mEq/L (ref 3.5–5.1)
Sodium: 142 mEq/L (ref 135–145)

## 2018-03-05 LAB — LIPID PANEL
Cholesterol: 148 mg/dL (ref 0–200)
HDL: 44.5 mg/dL (ref >39.00)
LDL Cholesterol: 89 mg/dL (ref 0–99)
NonHDL: 103.11
Total CHOL/HDL Ratio: 3
Triglycerides: 73 mg/dL (ref 0.0–149.0)
VLDL: 14.6 mg/dL (ref 0.0–40.0)

## 2018-03-05 LAB — CBC WITH DIFFERENTIAL/PLATELET
BASOS PCT: 0.7 % (ref 0.0–3.0)
Basophils Absolute: 0.1 10*3/uL (ref 0.0–0.1)
Eosinophils Absolute: 0.2 10*3/uL (ref 0.0–0.7)
Eosinophils Relative: 2.8 % (ref 0.0–5.0)
HCT: 45.4 % (ref 39.0–52.0)
Hemoglobin: 15.4 g/dL (ref 13.0–17.0)
LYMPHS ABS: 1 10*3/uL (ref 0.7–4.0)
Lymphocytes Relative: 13.9 % (ref 12.0–46.0)
MCHC: 33.9 g/dL (ref 30.0–36.0)
MCV: 93.4 fl (ref 78.0–100.0)
Monocytes Absolute: 0.5 10*3/uL (ref 0.1–1.0)
Monocytes Relative: 7.3 % (ref 3.0–12.0)
NEUTROS PCT: 75.3 % (ref 43.0–77.0)
Neutro Abs: 5.4 10*3/uL (ref 1.4–7.7)
Platelets: 221 10*3/uL (ref 150.0–400.0)
RBC: 4.86 Mil/uL (ref 4.22–5.81)
RDW: 12.9 % (ref 11.5–15.5)
WBC: 7.1 10*3/uL (ref 4.0–10.5)

## 2018-03-05 LAB — URINALYSIS, ROUTINE W REFLEX MICROSCOPIC
Bilirubin Urine: NEGATIVE
Hgb urine dipstick: NEGATIVE
Ketones, ur: NEGATIVE
Leukocytes, UA: NEGATIVE
Nitrite: NEGATIVE
RBC / HPF: NONE SEEN (ref 0–?)
Specific Gravity, Urine: 1.025 (ref 1.000–1.030)
TOTAL PROTEIN, URINE-UPE24: NEGATIVE
Urine Glucose: NEGATIVE
Urobilinogen, UA: 0.2 (ref 0.0–1.0)
WBC, UA: NONE SEEN (ref 0–?)
pH: 5 (ref 5.0–8.0)

## 2018-03-05 LAB — HEPATIC FUNCTION PANEL
ALT: 21 U/L (ref 0–53)
AST: 22 U/L (ref 0–37)
Albumin: 4.1 g/dL (ref 3.5–5.2)
Alkaline Phosphatase: 56 U/L (ref 39–117)
Bilirubin, Direct: 0.1 mg/dL (ref 0.0–0.3)
Total Bilirubin: 0.6 mg/dL (ref 0.2–1.2)
Total Protein: 7.3 g/dL (ref 6.0–8.3)

## 2018-03-05 LAB — PSA: PSA: 1.27 ng/mL (ref 0.10–4.00)

## 2018-03-05 LAB — HEMOGLOBIN A1C: Hgb A1c MFr Bld: 5.8 % (ref 4.6–6.5)

## 2018-03-05 LAB — TSH: TSH: 2.25 u[IU]/mL (ref 0.35–4.50)

## 2018-03-05 MED ORDER — GABAPENTIN 100 MG PO CAPS: 100.0000 mg | ORAL_CAPSULE | Freq: Three times a day (TID) | ORAL | 5 refills | Status: DC

## 2018-03-05 MED ORDER — FLUTICASONE-UMECLIDIN-VILANT 100-62.5-25 MCG/INH IN AEPB: 1.0000 | INHALATION_SPRAY | Freq: Every day | RESPIRATORY_TRACT | 11 refills | Status: DC

## 2018-03-05 NOTE — Patient Instructions (Signed)
OK to change the Symbicort to Trelegy  Please continue all other medications as before, including the albuterol faster acting rescue inhaler  Please take all new medication as prescribed - the gabapentin 100 mg three times per day  Please try the left wrist splint at night for at least 1 wk, but if not better, you can stop this.  Please have the pharmacy call with any other refills you may need.  Please continue your efforts at being more active, low cholesterol diet, and weight control.  You are otherwise up to date with prevention measures today.  Please keep your appointments with your specialists as you may have planned  Please go to the LAB in the Basement (turn left off the elevator) for the tests to be done today  You will be contacted by phone if any changes need to be made immediately.  Otherwise, you will receive a letter about your results with an explanation, but please check with MyChart first.  Please remember to sign up for MyChart if you have not done so, as this will be important to you in the future with finding out test results, communicating by private email, and scheduling acute appointments online when needed.  Please return in 6 months, or sooner if needed

## 2018-03-05 NOTE — Progress Notes (Signed)
Subjective:    Patient ID: Scott [name], male    DOB: 12/15/1955, 63 y.o.   MRN: [id number]  HPI  Here for wellness and f/u;  Overall doing ok;  Pt denies Chest pain, orthopnea, PND, worsening LE edema, palpitations, dizziness or syncope.  Pt denies neurological change such as new headache, facial or extremity weakness. Pt states overall good compliance with treatment and medications, good tolerability, and has been trying to follow appropriate diet.  Pt denies worsening depressive symptoms, suicidal ideation or panic. No fever, night sweats, wt loss, loss of appetite, or other constitutional symptoms.  Pt states good ability with ADL's, has low fall risk, home safety reviewed and adequate, no other significant changes in hearing or vision, and only occasionally active with exercise.   Has had about 6 months mild worsening sob with doe, clearly helped with the inhalers but not enough as before, still able to do ADLs and drives; Also Pt continues to have 3 mo recurring left neck and arm pain gradually worsening dull and burning ike pain, numbness and slight hand/grip weakness without dropping things ,but no bowel or bladder change, fever, wt loss,  worsening LE pain/numbness/weakness, gait change or falls.  Pt denies polydipsia, polyuria,  No other new complaints Past Medical History:  Diagnosis Date  . ALLERGIC RHINITIS 01/14/2007  . ANXIETY 09/28/2006  . Asbestos exposure   . Atrial fibrillation ([place]) 09/28/2006   s/p afib ablation x 2  . CHF (congestive heart failure) ([place])   . COPD (chronic obstructive pulmonary disease) ([place])   . Coronary artery disease 11/24/2011  . Diastolic dysfunction 06/27/2704  . Dyspnea    W/ EXERTION   . Dysrhythmia    AFIB     . Gallstones   . HYPERLIPIDEMIA 09/28/2006  . HYPERTENSION 09/28/2006  . HYPOTHYROIDISM, ACQUIRED NEC 09/28/2006   pt denies and doesn't know  . Long term current use of anticoagulant 04/08/2010  . OBSTRUCTIVE SLEEP APNEA 01/06/2008   NOT USING     . Right knee DJD 07/09/2010  . S/P CABG x 1 11/26/2011   Right internal mammary artery to right coronary artery  . S/P Maze operation for atrial fibrillation 11/26/2011   complete biatrial lesion set with clipping of LA appendage   Past Surgical History:  Procedure Laterality Date  . ANTERIOR CERVICAL DECOMP/DISCECTOMY FUSION N/A 06/20/2016   Procedure: Anterior Cervical Discectomy Fusion - Cervical five-Cervical six - Cervical six-Cervical seven - Cervicla seven- Thoracic one;  Surgeon: Earnie Larsson, MD;  Location: [place];  Service: Neurosurgery;  Laterality: N/A;  . CARDIOVERSION     X4   . CERVICAL FUSION  06/20/2016   C5-6, C6-7, C7-T1 anterior cervical discectomy with interbody fusion utilizing interbody peek cages, locally harvested autograft, and anterior plate instrumentation  . CORONARY ARTERY BYPASS GRAFT  11/26/2011   Procedure: CORONARY ARTERY BYPASS GRAFTING (CABG);  Surgeon: Rexene Alberts, MD;  Location: [place];  Service: Open Heart Surgery;  Laterality: N/A;  coronary artery bypass on pump times one utilizing the right internal mammary artery, transesophageal echocardiogram   . hand surgury Left   . LEFT HEART CATHETERIZATION WITH CORONARY ANGIOGRAM N/A 11/24/2011   Procedure: LEFT HEART CATHETERIZATION WITH CORONARY ANGIOGRAM;  Surgeon: Peter M Martinique, MD;  Location: [facility] CATH LAB;  Service: Cardiovascular;  Laterality: N/A;  . LOOP RECORDER IMPLANT N/A 07/06/2012   Procedure: LOOP RECORDER IMPLANT;  Surgeon: Thompson Grayer, MD;  Location: [facility] CATH LAB;  Service: Cardiovascular;  Laterality: N/A;  .  MAZE  11/26/2011   Procedure: MAZE;  Surgeon: Rexene Alberts, MD;  Location: [place];  Service: Open Heart Surgery;  Laterality: N/A;  [place]aze   . Radiofrequency Ablation for atrial fibrillation  12/30/07 and 06/05/09   afib ablation x 2 by JA  . Radiofrequency ablation for atrial flutter  2005   [facility]    reports that he quit smoking about 22 years ago. His smoking use included cigarettes. He  has a 39.00 pack-year smoking history. He has never used smokeless tobacco. He reports that he does not drink alcohol or use drugs. family history includes Atrial fibrillation in his mother; Cancer in his paternal grandmother; Dementia in his father and paternal grandfather; Hypertension in his father; Stroke in his mother. Allergies  Allergen Reactions  . Zolpidem Tartrate Other (See Comments)    Extremely drowsy the next day   Current Outpatient Medications on File Prior to Visit  Medication Sig Dispense Refill  . acetaminophen (TYLENOL) 500 MG tablet Take 1,000 mg by mouth every 6 (six) hours as needed for mild pain.     Marland Kitchen albuterol (PROVENTIL HFA;VENTOLIN HFA) 108 (90 Base) MCG/ACT inhaler Inhale 2 puffs into the lungs every 6 (six) hours as needed for wheezing or shortness of breath. 1 Inhaler 11  . ALPRAZolam (XANAX) 0.5 MG tablet TAKE 1 TABLET BY MOUTH TWICE A DAY AS NEEDED FOR ANXIETY 180 tablet 1  . cholecalciferol (VITAMIN D) 1000 units tablet Take 1,000 Units by mouth daily.    Marland Kitchen docusate sodium (COLACE) 100 MG capsule Take 100 mg by mouth daily as needed for mild constipation.    Marland Kitchen ELIQUIS 5 MG TABS tablet TAKE 1 TABLET BY MOUTH 2 TIMES DAILY. 180 tablet 1  . lovastatin (MEVACOR) 40 MG tablet TAKE 2 TABLETS (80 MG TOTAL) BY MOUTH EVERY EVENING. 180 tablet 2  . metoprolol succinate (TOPROL-XL) 50 MG 24 hr tablet TAKE ONE AND ONE HALF TABLETS BY MOUTH DAILY 135 tablet 1  . Multiple Vitamin (MULTIVITAMIN WITH MINERALS) TABS tablet Take 1 tablet by mouth daily.    . Olmesartan-amLODIPine-HCTZ 40-5-12.5 MG TABS TAKE 1 TABLET BY MOUTH EVERY DAY 90 tablet 3  . temazepam (RESTORIL) 30 MG capsule TAKE 1 CAPSULE BY MOUTH AT BEDTIME AS NEEDED FOR SLEEP 90 capsule 1  . traMADol (ULTRAM) 50 MG tablet TAKE 1 TABLET BY MOUTH EVERY 6 HOURS AS NEEDED 120 tablet 2  . traMADol (ULTRAM) 50 MG tablet TAKE 1 TABLET BY MOUTH EVERY 6 HOURS AS NEEDED 120 tablet 2   No current facility-administered  medications on file prior to visit.    Review of Systems Constitutional: Negative for other unusual diaphoresis, sweats, appetite or weight changes HENT: Negative for other worsening hearing loss, ear pain, facial swelling, mouth sores or neck stiffness.   Eyes: Negative for other worsening pain, redness or other visual disturbance.  Respiratory: Negative for other stridor or swelling Cardiovascular: Negative for other palpitations or other chest pain  Gastrointestinal: Negative for worsening diarrhea or loose stools, blood in stool, distention or other pain Genitourinary: Negative for hematuria, flank pain or other change in urine volume.  Musculoskeletal: Negative for myalgias or other joint swelling.  Skin: Negative for other color change, or other wound or worsening drainage.  Neurological: Negative for other syncope or numbness. Hematological: Negative for other adenopathy or swelling Psychiatric/Behavioral: Negative for hallucinations, other worsening agitation, SI, self-injury, or new decreased concentration All other system neg per pt    Objective:   Physical Exam BP  126/84   Pulse 77   Temp 98.3 F (36.8 C) (Oral)   Ht 5\' 6"  (1.676 m)   Wt 192 lb (87.1 kg)   SpO2 95%   BMI 30.99 kg/m  VS noted,  Constitutional: Pt is oriented to person, place, and time. Appears well-developed and well-nourished, in no significant distress and comfortable Head: Normocephalic and atraumatic  Eyes: Conjunctivae and EOM are normal. Pupils are equal, round, and reactive to light Right Ear: External ear normal without discharge Left Ear: External ear normal without discharge Nose: Nose without discharge or deformity Mouth/Throat: Oropharynx is without other ulcerations and moist  Neck: Normal range of motion. Neck supple. No JVD present. No tracheal deviation present or significant neck LA or mass Cardiovascular: Normal rate, regular rhythm, normal heart sounds and intact distal pulses.     Pulmonary/Chest: WOB normal and breath sounds decreased without rales or wheezing  Abdominal: Soft. Bowel sounds are normal. NT. No HSM  Musculoskeletal: Normal range of motion. Exhibits no edema Lymphadenopathy: Has no other cervical adenopathy.  Neurological: Pt is alert and oriented to person, place, and time. Pt has normal reflexes. No cranial nerve deficit. Motor grossly intact, Gait intact Skin: Skin is warm and dry. No rash noted or new ulcerations Psychiatric:  Has normal mood and affect. Behavior is normal without agitation No other exam findings Lab Results  Component Value Date   WBC 7.1 03/05/2018   HGB 15.4 03/05/2018   HCT 45.4 03/05/2018   PLT 221.0 03/05/2018   GLUCOSE 104 (H) 03/05/2018   CHOL 148 03/05/2018   TRIG 73.0 03/05/2018   HDL 44.50 03/05/2018   LDLCALC 89 03/05/2018   ALT 21 03/05/2018   AST 22 03/05/2018   NA 142 03/05/2018   K 4.5 03/05/2018   CL 106 03/05/2018   CREATININE 1.17 03/05/2018   BUN 20 03/05/2018   CO2 29 03/05/2018   TSH 2.25 03/05/2018   PSA 1.27 03/05/2018   INR 0.98 06/20/2016   HGBA1C 5.8 03/05/2018       Assessment & Plan:

## 2018-03-06 NOTE — Assessment & Plan Note (Signed)

## 2018-03-06 NOTE — Assessment & Plan Note (Signed)
With symptoms mildly worsening overall, to try change symbicort to trelegy asd, cont all other tx, to f/u any worsening symptoms or concerns

## 2018-03-06 NOTE — Assessment & Plan Note (Signed)
For gabapentin 100 tid trial, consider 300 tid in 1 wk if not effective, also to try left wrist splint for 1 wk to r/o CTS but seems less likley than radiculitis related neuritic symptoms, pt declines C spine MRI to start with, or NCS

## 2018-03-06 NOTE — Assessment & Plan Note (Signed)
stable overall by history and exam, recent data reviewed with pt, and pt to continue medical treatment as before,  to f/u any worsening symptoms or concerns  

## 2018-03-12 ENCOUNTER — Other Ambulatory Visit: Payer: Self-pay | Admitting: Internal Medicine

## 2018-03-13 NOTE — Telephone Encounter (Signed)
Pt has tried and failed symbicort, so please see if this would qualify in a PA so that pt can receive this med, thanks

## 2018-03-13 NOTE — Telephone Encounter (Signed)
Per pharmacy: Alternative Requested:STEP THERAPY, PT MUST TRY AND FAIL (ADVAIR. ANORO, BREO, SEREVENT)

## 2018-03-15 NOTE — Telephone Encounter (Signed)
See other note;  Pt has failed outpt symbicort so no need to try similar adviar  I was hoping the PA could be done to address this, thanks

## 2018-03-15 NOTE — Telephone Encounter (Signed)
I have spoke with the pharmacist. Pt must try/fail one of the listed medications. Please advise.

## 2018-03-17 ENCOUNTER — Other Ambulatory Visit: Payer: Self-pay | Admitting: Internal Medicine

## 2018-03-17 NOTE — Telephone Encounter (Signed)
Key: [id number]

## 2018-03-23 ENCOUNTER — Other Ambulatory Visit: Payer: Self-pay | Admitting: Internal Medicine

## 2018-03-24 NOTE — Telephone Encounter (Signed)
Done erx 

## 2018-03-28 ENCOUNTER — Other Ambulatory Visit: Payer: Self-pay | Admitting: Internal Medicine

## 2018-03-29 ENCOUNTER — Telehealth: Payer: Self-pay | Admitting: Internal Medicine

## 2018-03-29 NOTE — Telephone Encounter (Signed)
Copied from [place] [phone]. Topic: Quick Communication - See Telephone Encounter >> Mar 29, 2018 11:47 AM Bea Graff, NT wrote: CRM for notification. See Telephone encounter for: 03/29/18. Ricky with [employer] states that she is requesting further info, such as the PCN and BIN number for the PA for Fluticasone-Umeclidin-Vilant (TRELEGY ELLIPTA) [id number] MCG/INH AEPB. CB#: [phone] Ref#: [id number]

## 2018-04-08 NOTE — Telephone Encounter (Signed)
Scott [name] with [employer] is calling to f/u on request for PA as they have not received hard copy. Please contact by phone in order to pursue prior auth with the insurance company ([employer]) ph# [phone].

## 2018-04-12 NOTE — Telephone Encounter (Signed)
Medication is covered under his health team advantage plan per Katharine Look, [place] I spoke with at the [phone] number provided. With a co-pay of $45.

## 2018-04-18 ENCOUNTER — Other Ambulatory Visit: Payer: Self-pay | Admitting: Internal Medicine

## 2018-04-19 ENCOUNTER — Telehealth: Payer: Self-pay | Admitting: Internal Medicine

## 2018-04-19 MED ORDER — TEMAZEPAM 30 MG PO CAPS: 30.0000 mg | ORAL_CAPSULE | Freq: Every evening | ORAL | 1 refills | Status: DC | PRN

## 2018-04-19 NOTE — Telephone Encounter (Signed)
Done erx 

## 2018-05-03 ENCOUNTER — Other Ambulatory Visit: Payer: Self-pay | Admitting: Internal Medicine

## 2018-06-02 ENCOUNTER — Other Ambulatory Visit: Payer: Self-pay | Admitting: Internal Medicine

## 2018-06-15 ENCOUNTER — Other Ambulatory Visit: Payer: Self-pay | Admitting: Internal Medicine

## 2018-06-29 ENCOUNTER — Other Ambulatory Visit: Payer: Self-pay | Admitting: Internal Medicine

## 2018-06-29 NOTE — Telephone Encounter (Signed)
Done erx 

## 2018-07-18 ENCOUNTER — Other Ambulatory Visit: Payer: Self-pay | Admitting: Internal Medicine

## 2018-08-16 ENCOUNTER — Telehealth: Payer: Self-pay | Admitting: Internal Medicine

## 2018-08-16 NOTE — Telephone Encounter (Signed)
Patient dropped off a renewal parking placard.  Form has been completed &placed in providers box to review and sign.

## 2018-08-17 NOTE — Telephone Encounter (Signed)
Form has been signed, copy sent to scan.   Original form has been mailed to patient as he requested.

## 2018-09-06 ENCOUNTER — Encounter: Payer: Self-pay | Admitting: Internal Medicine

## 2018-09-06 ENCOUNTER — Other Ambulatory Visit: Payer: Self-pay

## 2018-09-06 ENCOUNTER — Other Ambulatory Visit (INDEPENDENT_AMBULATORY_CARE_PROVIDER_SITE_OTHER): Payer: PPO

## 2018-09-06 ENCOUNTER — Ambulatory Visit (INDEPENDENT_AMBULATORY_CARE_PROVIDER_SITE_OTHER)
Admission: RE | Admit: 2018-09-06 | Discharge: 2018-09-06 | Disposition: A | Discharge status: Home / Self Care | Payer: PPO | Source: Ambulatory Visit | Attending: Internal Medicine | Admitting: Internal Medicine

## 2018-09-06 ENCOUNTER — Ambulatory Visit (INDEPENDENT_AMBULATORY_CARE_PROVIDER_SITE_OTHER): Payer: PPO | Admitting: Internal Medicine

## 2018-09-06 VITALS — BP 122/84 | HR 77 | Temp 98.1°F | Ht 66.0 in | Wt 190.0 lb

## 2018-09-06 DIAGNOSIS — E611 Iron deficiency: Secondary | ICD-10-CM

## 2018-09-06 DIAGNOSIS — M25561 Pain in right knee: Secondary | ICD-10-CM | POA: Diagnosis not present

## 2018-09-06 DIAGNOSIS — R0600 Dyspnea, unspecified: Secondary | ICD-10-CM

## 2018-09-06 DIAGNOSIS — E039 Hypothyroidism, unspecified: Secondary | ICD-10-CM

## 2018-09-06 DIAGNOSIS — E538 Deficiency of other specified B group vitamins: Secondary | ICD-10-CM | POA: Diagnosis not present

## 2018-09-06 DIAGNOSIS — R739 Hyperglycemia, unspecified: Secondary | ICD-10-CM

## 2018-09-06 DIAGNOSIS — E559 Vitamin D deficiency, unspecified: Secondary | ICD-10-CM

## 2018-09-06 DIAGNOSIS — J449 Chronic obstructive pulmonary disease, unspecified: Secondary | ICD-10-CM | POA: Diagnosis not present

## 2018-09-06 DIAGNOSIS — I10 Essential (primary) hypertension: Secondary | ICD-10-CM | POA: Diagnosis not present

## 2018-09-06 DIAGNOSIS — M25461 Effusion, right knee: Secondary | ICD-10-CM | POA: Diagnosis not present

## 2018-09-06 DIAGNOSIS — Z0000 Encounter for general adult medical examination without abnormal findings: Secondary | ICD-10-CM

## 2018-09-06 DIAGNOSIS — M1711 Unilateral primary osteoarthritis, right knee: Secondary | ICD-10-CM | POA: Diagnosis not present

## 2018-09-06 LAB — BASIC METABOLIC PANEL
BUN: 25 mg/dL — ABNORMAL HIGH (ref 6–23)
CO2: 27 mEq/L (ref 19–32)
Calcium: 9.4 mg/dL (ref 8.4–10.5)
Chloride: 105 mEq/L (ref 96–112)
Creatinine, Ser: 1.27 mg/dL (ref 0.40–1.50)
GFR: 57.2 mL/min — ABNORMAL LOW (ref >60.00)
Glucose, Bld: 82 mg/dL (ref 70–99)
Potassium: 4.2 mEq/L (ref 3.5–5.1)
Sodium: 142 mEq/L (ref 135–145)

## 2018-09-06 LAB — HEPATIC FUNCTION PANEL
ALT: 25 U/L (ref 0–53)
AST: 25 U/L (ref 0–37)
Albumin: 4.4 g/dL (ref 3.5–5.2)
Alkaline Phosphatase: 53 U/L (ref 39–117)
Bilirubin, Direct: 0.1 mg/dL (ref 0.0–0.3)
Total Bilirubin: 0.8 mg/dL (ref 0.2–1.2)
Total Protein: 7.1 g/dL (ref 6.0–8.3)

## 2018-09-06 LAB — CBC WITH DIFFERENTIAL/PLATELET
Basophils Absolute: 0 10*3/uL (ref 0.0–0.1)
Basophils Relative: 0.4 % (ref 0.0–3.0)
Eosinophils Absolute: 0.2 10*3/uL (ref 0.0–0.7)
Eosinophils Relative: 3.2 % (ref 0.0–5.0)
HCT: 43.9 % (ref 39.0–52.0)
Hemoglobin: 15 g/dL (ref 13.0–17.0)
Lymphocytes Relative: 15.4 % (ref 12.0–46.0)
Lymphs Abs: 1.2 10*3/uL (ref 0.7–4.0)
MCHC: 34.2 g/dL (ref 30.0–36.0)
MCV: 94.9 fl (ref 78.0–100.0)
Monocytes Absolute: 0.6 10*3/uL (ref 0.1–1.0)
Monocytes Relative: 8 % (ref 3.0–12.0)
Neutro Abs: 5.5 10*3/uL (ref 1.4–7.7)
Neutrophils Relative %: 73 % (ref 43.0–77.0)
Platelets: 180 10*3/uL (ref 150.0–400.0)
RBC: 4.62 Mil/uL (ref 4.22–5.81)
RDW: 12.9 % (ref 11.5–15.5)
WBC: 7.6 10*3/uL (ref 4.0–10.5)

## 2018-09-06 LAB — VITAMIN D 25 HYDROXY (VIT D DEFICIENCY, FRACTURES): VITD: 60.25 ng/mL (ref 30.00–100.00)

## 2018-09-06 LAB — VITAMIN B12: Vitamin B-12: 430 pg/mL (ref 211–911)

## 2018-09-06 LAB — HEMOGLOBIN A1C: Hgb A1c MFr Bld: 5.4 % (ref 4.6–6.5)

## 2018-09-06 LAB — BRAIN NATRIURETIC PEPTIDE: Pro B Natriuretic peptide (BNP): 134 pg/mL — ABNORMAL HIGH (ref 0.0–100.0)

## 2018-09-06 LAB — IBC PANEL
Iron: 232 ug/dL — ABNORMAL HIGH (ref 42–165)
Saturation Ratios: 54.9 % — ABNORMAL HIGH (ref 20.0–50.0)
Transferrin: 302 mg/dL (ref 212.0–360.0)

## 2018-09-06 LAB — TSH: TSH: 3.36 u[IU]/mL (ref 0.35–4.50)

## 2018-09-06 NOTE — Assessment & Plan Note (Signed)
stable overall by history and exam, recent data reviewed with pt, and pt to continue medical treatment as before,  to f/u any worsening symptoms or concerns  

## 2018-09-06 NOTE — Assessment & Plan Note (Signed)
Etiology unclear, etiology unclaer, for cxr, albs as ordered, and echocardiogram  Note:  Total time for pt hx, exam, review of record with pt in the room, determination of diagnoses and plan for further eval and tx is > 40 min, with over 50% spent in coordination and counseling of patient including the differential dx, tx, further evaluation and other management of dyspnea, right knee pain, hyperglycemia, HTN, hypothyroidism

## 2018-09-06 NOTE — Progress Notes (Signed)
Subjective:    Patient ID: Scott [name], male    DOB: [birth year], 63 y.o.   MRN: [id number]  HPI  Here with mild worsening sob/doe x 80mo, intermittent,  works tues wed thur fri and walks the dog after work about 1 mile; goes slowly but using inhaler more foten in the heat; no fever, ST, wheezing, CP but does have scant productive cough, no blood, but yellow/white.  Pt denies chest pain, wheezing, orthopnea, PND, increased LE swelling, palpitations, dizziness or syncope.  Also noticed a posisbly new large varicosity to the rght medial thigh, wondering if might be a problem, No pain.  Also c/o right knee pain, moderate with effusion, intermittetn, worse to walk, better to sit but sometimes wants to buckle with walking, no falls yet.  Pt denies new neurological symptoms such as new headache, or facial or extremity weakness or numbness   Pt denies polydipsia, polyuria.  Denies hyper or hypo thyroid symptoms such as voice, skin or hair change. Past Medical History:  Diagnosis Date  . ALLERGIC RHINITIS 01/14/2007  . ANXIETY 09/28/2006  . Asbestos exposure   . Atrial fibrillation ([place]) 09/28/2006   s/p afib ablation x 2  . CHF (congestive heart failure) ([place])   . COPD (chronic obstructive pulmonary disease) ([place])   . Coronary artery disease 11/24/2011  . Diastolic dysfunction 08/29/6431  . Dyspnea    W/ EXERTION   . Dysrhythmia    AFIB     . Gallstones   . HYPERLIPIDEMIA 09/28/2006  . HYPERTENSION 09/28/2006  . HYPOTHYROIDISM, ACQUIRED NEC 09/28/2006   pt denies and doesn't know  . Long term current use of anticoagulant 04/08/2010  . OBSTRUCTIVE SLEEP APNEA 01/06/2008   NOT USING    . Right knee DJD 07/09/2010  . S/P CABG x 1 11/26/2011   Right internal mammary artery to right coronary artery  . S/P Maze operation for atrial fibrillation 11/26/2011   complete biatrial lesion set with clipping of LA appendage   Past Surgical History:  Procedure Laterality Date  . ANTERIOR CERVICAL DECOMP/DISCECTOMY  FUSION N/A 06/20/2016   Procedure: Anterior Cervical Discectomy Fusion - Cervical five-Cervical six - Cervical six-Cervical seven - Cervicla seven- Thoracic one;  Surgeon: Earnie Larsson, MD;  Location: [place];  Service: Neurosurgery;  Laterality: N/A;  . CARDIOVERSION     X4   . CERVICAL FUSION  06/20/2016   C5-6, C6-7, C7-T1 anterior cervical discectomy with interbody fusion utilizing interbody peek cages, locally harvested autograft, and anterior plate instrumentation  . CORONARY ARTERY BYPASS GRAFT  11/26/2011   Procedure: CORONARY ARTERY BYPASS GRAFTING (CABG);  Surgeon: Rexene Alberts, MD;  Location: [place];  Service: Open Heart Surgery;  Laterality: N/A;  coronary artery bypass on pump times one utilizing the right internal mammary artery, transesophageal echocardiogram   . hand surgury Left   . LEFT HEART CATHETERIZATION WITH CORONARY ANGIOGRAM N/A 11/24/2011   Procedure: LEFT HEART CATHETERIZATION WITH CORONARY ANGIOGRAM;  Surgeon: Peter M Martinique, MD;  Location: [facility] CATH LAB;  Service: Cardiovascular;  Laterality: N/A;  . LOOP RECORDER IMPLANT N/A 07/06/2012   Procedure: LOOP RECORDER IMPLANT;  Surgeon: Thompson Grayer, MD;  Location: [facility] CATH LAB;  Service: Cardiovascular;  Laterality: N/A;  . MAZE  11/26/2011   Procedure: MAZE;  Surgeon: Rexene Alberts, MD;  Location: [place];  Service: Open Heart Surgery;  Laterality: N/A;  Cryomaze   . Radiofrequency Ablation for atrial fibrillation  12/30/07 and 06/05/09   afib ablation x 2  by Greggory Brandy  . Radiofrequency ablation for atrial flutter  2005   CTI    reports that he quit smoking about 23 years ago. His smoking use included cigarettes. He has a 39.00 pack-year smoking history. He has never used smokeless tobacco. He reports that he does not drink alcohol or use drugs. family history includes Atrial fibrillation in his mother; Cancer in his paternal grandmother; Dementia in his father and paternal grandfather; Hypertension in his father; Stroke in his mother.  Allergies  Allergen Reactions  . Zolpidem Tartrate Other (See Comments)    Extremely drowsy the next day   Current Outpatient Medications on File Prior to Visit  Medication Sig Dispense Refill  . acetaminophen (TYLENOL) 500 MG tablet Take 1,000 mg by mouth every 6 (six) hours as needed for mild pain.     Marland Kitchen ALPRAZolam (XANAX) 0.5 MG tablet TAKE 1 TABLET BY MOUTH TWICE A DAY AS NEEDED FOR ANXIETY 180 tablet 1  . budesonide-formoterol (SYMBICORT) 160-4.5 MCG/ACT inhaler TAKE 2 PUFFS BY MOUTH TWICE A DAY 30.6 Inhaler 4  . cholecalciferol (VITAMIN D) 1000 units tablet Take 1,000 Units by mouth daily.    Marland Kitchen docusate sodium (COLACE) 100 MG capsule Take 100 mg by mouth daily as needed for mild constipation.    Marland Kitchen ELIQUIS 5 MG TABS tablet TAKE 1 TABLET BY MOUTH 2 TIMES DAILY. 180 tablet 1  . Fluticasone-Umeclidin-Vilant (TRELEGY ELLIPTA) 100-62.5-25 MCG/INH AEPB Inhale 1 puff into the lungs daily. 1 each 11  . gabapentin (NEURONTIN) 100 MG capsule TAKE 1 CAPSULE BY MOUTH THREE TIMES A DAY 270 capsule 2  . lovastatin (MEVACOR) 40 MG tablet TAKE 2 TABLETS (80 MG TOTAL) BY MOUTH EVERY EVENING. 180 tablet 2  . metoprolol succinate (TOPROL-XL) 50 MG 24 hr tablet TAKE ONE AND ONE HALF TABLETS BY MOUTH DAILY 135 tablet 1  . Multiple Vitamin (MULTIVITAMIN WITH MINERALS) TABS tablet Take 1 tablet by mouth daily.    . Olmesartan-amLODIPine-HCTZ 40-5-12.5 MG TABS TAKE 1 TABLET BY MOUTH EVERY DAY 90 tablet 3  . PROAIR HFA 108 (90 Base) MCG/ACT inhaler TAKE 2 PUFFS BY MOUTH EVERY 6 HOURS AS NEEDED FOR WHEEZE OR SHORTNESS OF BREATH 8.5 Inhaler 11  . temazepam (RESTORIL) 30 MG capsule Take 1 capsule (30 mg total) by mouth at bedtime as needed. for sleep 90 capsule 1  . traMADol (ULTRAM) 50 MG tablet TAKE 1 TABLET BY MOUTH EVERY 6 HOURS AS NEEDED 120 tablet 2  . traMADol (ULTRAM) 50 MG tablet TAKE 1 TABLET BY MOUTH EVERY 6 HOURS AS NEEDED 120 tablet 2   No current facility-administered medications on file prior to  visit.    Review of Systems  Constitutional: Negative for other unusual diaphoresis or sweats HENT: Negative for ear discharge or swelling Eyes: Negative for other worsening visual disturbances Respiratory: Negative for stridor or other swelling  Gastrointestinal: Negative for worsening distension or other blood Genitourinary: Negative for retention or other urinary change Musculoskeletal: Negative for other MSK pain or swelling Skin: Negative for color change or other new lesions Neurological: Negative for worsening tremors and other numbness  Psychiatric/Behavioral: Negative for worsening agitation or other fatigue All other system neg per pt    Objective:   Physical Exam BP 122/84   Pulse 77   Temp 98.1 F (36.7 C) (Oral)   Ht 5\' 6"  (1.676 m)   Wt 190 lb (86.2 kg)   SpO2 95%   BMI 30.67 kg/m  VS noted,  Constitutional: Pt appears in NAD  HENT: Head: NCAT.  Right Ear: External ear normal.  Left Ear: External ear normal.  Eyes: . Pupils are equal, round, and reactive to light. Conjunctivae and EOM are normal Nose: without d/c or deformity Neck: Neck supple. Gross normal ROM Cardiovascular: Normal rate and regular rhythm.   Pulmonary/Chest: Effort normal and breath sounds without rales or wheezing.  Abd:  Soft, NT, ND, + BS, no organomegaly Right knee diffuse tender, warm wih 1+ effusion and decreased ROM Neurological: Pt is alert. At baseline orientation, motor grossly intact Skin: Skin is warm. No rashes, no LE edema but has a single large nontender varicosity to the right medial thigh Psychiatric: Pt behavior is normal without agitation  No other exam findings Lab Results  Component Value Date   WBC 7.6 09/06/2018   HGB 15.0 09/06/2018   HCT 43.9 09/06/2018   PLT 180.0 09/06/2018   GLUCOSE 82 09/06/2018   CHOL 148 03/05/2018   TRIG 73.0 03/05/2018   HDL 44.50 03/05/2018   LDLCALC 89 03/05/2018   ALT 25 09/06/2018   AST 25 09/06/2018   NA 142 09/06/2018   K  4.2 09/06/2018   CL 105 09/06/2018   CREATININE 1.27 09/06/2018   BUN 25 (H) 09/06/2018   CO2 27 09/06/2018   TSH 3.36 09/06/2018   PSA 1.27 03/05/2018   INR 0.98 06/20/2016   HGBA1C 5.4 09/06/2018          Assessment & Plan:

## 2018-09-06 NOTE — Patient Instructions (Signed)
Please continue all other medications as before, and refills have been done if requested.  Please have the pharmacy call with any other refills you may need.  Please continue your efforts at being more active, low cholesterol diet, and weight control.  You are otherwise up to date with prevention measures today.  Please keep your appointments with your specialists as you may have planned  You will be contacted regarding the referral for: Echocardiogram, and referral to Dr Tamala Julian  Please go to the XRAY Department in the Basement (go straight as you get off the elevator) for the x-ray testing for the chest and the right knee  Please go to the LAB in the Basement (turn left off the elevator) for the tests to be done today  You will be contacted by phone if any changes need to be made immediately.  Otherwise, you will receive a letter about your results with an explanation, but please check with MyChart first.  Please remember to sign up for MyChart if you have not done so, as this will be important to you in the future with finding out test results, communicating by private email, and scheduling acute appointments online when needed.  Please return in 6 months, or sooner if needed, with Lab testing done 3-5 days before

## 2018-09-06 NOTE — Assessment & Plan Note (Signed)
C/w likely djd, for film, pain control, refer Sports Medicine

## 2018-09-13 ENCOUNTER — Ambulatory Visit (HOSPITAL_COMMUNITY): Payer: PPO | Attending: Internal Medicine

## 2018-09-13 ENCOUNTER — Other Ambulatory Visit: Payer: Self-pay

## 2018-09-13 ENCOUNTER — Telehealth: Payer: Self-pay

## 2018-09-13 ENCOUNTER — Encounter: Payer: Self-pay | Admitting: Internal Medicine

## 2018-09-13 ENCOUNTER — Other Ambulatory Visit: Payer: Self-pay | Admitting: Internal Medicine

## 2018-09-13 DIAGNOSIS — I429 Cardiomyopathy, unspecified: Secondary | ICD-10-CM

## 2018-09-13 DIAGNOSIS — R0600 Dyspnea, unspecified: Secondary | ICD-10-CM | POA: Diagnosis not present

## 2018-09-13 NOTE — Telephone Encounter (Signed)
-----   Message from [name], MD sent at 09/13/2018 12:54 PM EDT ----- Letter sent, cont same tx except  The test results show that your current treatment is OK, except the overall heart function (ejection fraction) is mildly worse since your last echocardiogram in 2013.  I see on the chart that Dr Lovena Le asked you to return in 2 years when you last saw him in Aug 2019, but I think with your symptoms, and your worsening echocardiogram, that you should probably be seen sooner (now).  I will go ahead and refer to Dr Lovena Le, and hopefully you will hear soon.  Shirron to please inform pt, I will do referral

## 2018-09-13 NOTE — Telephone Encounter (Signed)
Pt has been informed of results and expressed understanding.  °

## 2018-09-25 NOTE — Progress Notes (Signed)
[name] [address] [place], [place] [address] Phone: [phone] Subjective:    I'm seeing this patient by the request  of:    CC: Right knee pain   [id number]  Scott [name] is a 63 y.o. male coming in with complaint of right knee pain. Last seen in 2016 for right knee arthritis. Patient states that his knee started to swell one month ago. Pain is constant but can become sharp with walking. Denies any radiating symptoms. Uses 2 tramadol in the morning for pain.    Patient did have x-rays done on September 06, 2018.  Found to have a joint effusion as well as tricompartmental osteoarthritic changes moderate nature.  Past Medical History:  Diagnosis Date  . ALLERGIC RHINITIS 01/14/2007  . ANXIETY 09/28/2006  . Asbestos exposure   . Atrial fibrillation ([place]) 09/28/2006   s/p afib ablation x 2  . CHF (congestive heart failure) ([place])   . COPD (chronic obstructive pulmonary disease) ([place])   . Coronary artery disease 11/24/2011  . Diastolic dysfunction 09/02/8919  . Dyspnea    W/ EXERTION   . Dysrhythmia    AFIB     . Gallstones   . HYPERLIPIDEMIA 09/28/2006  . HYPERTENSION 09/28/2006  . HYPOTHYROIDISM, ACQUIRED NEC 09/28/2006   pt denies and doesn't know  . Long term current use of anticoagulant 04/08/2010  . OBSTRUCTIVE SLEEP APNEA 01/06/2008   NOT USING    . Right knee DJD 07/09/2010  . S/P CABG x 1 11/26/2011   Right internal mammary artery to right coronary artery  . S/P Maze operation for atrial fibrillation 11/26/2011   complete biatrial lesion set with clipping of LA appendage   Past Surgical History:  Procedure Laterality Date  . ANTERIOR CERVICAL DECOMP/DISCECTOMY FUSION N/A 06/20/2016   Procedure: Anterior Cervical Discectomy Fusion - Cervical five-Cervical six - Cervical six-Cervical seven - Cervicla seven- Thoracic one;  Surgeon: Earnie Larsson, MD;  Location: [place];  Service: Neurosurgery;  Laterality: N/A;  . CARDIOVERSION     X4   . CERVICAL  FUSION  06/20/2016   C5-6, C6-7, C7-T1 anterior cervical discectomy with interbody fusion utilizing interbody peek cages, locally harvested autograft, and anterior plate instrumentation  . CORONARY ARTERY BYPASS GRAFT  11/26/2011   Procedure: CORONARY ARTERY BYPASS GRAFTING (CABG);  Surgeon: Rexene Alberts, MD;  Location: [place];  Service: Open Heart Surgery;  Laterality: N/A;  coronary artery bypass on pump times one utilizing the right internal mammary artery, transesophageal echocardiogram   . hand surgury Left   . LEFT HEART CATHETERIZATION WITH CORONARY ANGIOGRAM N/A 11/24/2011   Procedure: LEFT HEART CATHETERIZATION WITH CORONARY ANGIOGRAM;  Surgeon: Peter M Martinique, MD;  Location[facility] C[facility] LAB;  Service: Cardiovascular;  Laterality: N/A;  . LOOP RECORDER IMPLANT N/A 07/06/2012   Procedure: LOOP RECORDER IMPLANT;  Surgeon: Thompson Grayer, MD;  Location: [facility] CATH LAB;  Service: Cardiovascular;  Laterality: N/A;  . MAZE  11/26/2011   Procedure: MAZE;  Surgeon: Rexene Alberts, MD;  Location: [place];  Service: Open Heart Surgery;  Laterality: N/A;  Cryomaze   . Radiofrequency Ablation for atrial fibrillation  12/30/07 and 06/05/09   afib ablation x 2 by JA  . Radiofrequency ablation for atrial flutter  2005   CTI   Social History   Socioeconomic History  . Marital status: Single    Spouse name: Not on file  . Number of children: 0  . Years of education: Not on  file  . Highest education level: Not on file  Occupational History  . Occupation: worked previously in the [employer] and feels that he was exposed to asbestos.    Employer: [employer]  Social Needs  . Financial resource strain: Not hard at all  . Food insecurity    Worry: Never true    Inability: Never true  . Transportation needs    Medical: No    Non-medical: No  Tobacco Use  . Smoking status: Former Smoker    Packs/day: 1.50    Years: 26.00    Pack years: 39.00    Types: Cigarettes    Quit date: 04/06/1995     Years since quitting: 23.4  . Smokeless tobacco: Never Used  Substance and Sexual Activity  . Alcohol use: No    Alcohol/week: 0.0 standard drinks  . Drug use: No  . Sexual activity: Yes  Lifestyle  . Physical activity    Days per week: 4 days    Minutes per session: 50 min  . Stress: Not at all  Relationships  . Social connections    Talks on phone: More than three times a week    Gets together: More than three times a week    Attends religious service: Never    Active member of club or organization: Yes    Attends meetings of clubs or organizations: More than 4 times per year    Relationship status: Not on file  Other Topics Concern  . Not on file  Social History Narrative   Pt lives in [place], [place] with his friend.    Allergies  Allergen Reactions  . Zolpidem Tartrate Other (See Comments)    Extremely drowsy the next day   Family History  Problem Relation Age of Onset  . Dementia Father   . Hypertension Father   . Atrial fibrillation Mother   . Stroke Mother   . Dementia Paternal Grandfather   . Cancer Paternal Grandmother        type unknown     Current Outpatient Medications (Cardiovascular):  .  lovastatin (MEVACOR) 40 MG tablet, TAKE 2 TABLETS (80 MG TOTAL) BY MOUTH EVERY EVENING. .  metoprolol succinate (TOPROL-XL) 50 MG 24 hr tablet, TAKE ONE AND ONE HALF TABLETS BY MOUTH DAILY .  Olmesartan-amLODIPine-HCTZ 40-5-12.5 MG TABS, TAKE 1 TABLET BY MOUTH EVERY DAY  Current Outpatient Medications (Respiratory):  .  budesonide-formoterol (SYMBICORT) 160-4.5 MCG/ACT inhaler, TAKE 2 PUFFS BY MOUTH TWICE A DAY .  Fluticasone-Umeclidin-Vilant (TRELEGY ELLIPTA) 100-62.5-25 MCG/INH AEPB, Inhale 1 puff into the lungs daily. Marland Kitchen  PROAIR HFA 108 (90 Base) MCG/ACT inhaler, TAKE 2 PUFFS BY MOUTH EVERY 6 HOURS AS NEEDED FOR WHEEZE OR SHORTNESS OF BREATH  Current Outpatient Medications (Analgesics):  .  acetaminophen (TYLENOL) 500 MG tablet, Take 1,000 mg by mouth every 6  (six) hours as needed for mild pain.  .  traMADol (ULTRAM) 50 MG tablet, TAKE 1 TABLET BY MOUTH EVERY 6 HOURS AS NEEDED .  traMADol (ULTRAM) 50 MG tablet, TAKE 1 TABLET BY MOUTH EVERY 6 HOURS AS NEEDED  Current Outpatient Medications (Hematological):  Marland Kitchen  ELIQUIS 5 MG TABS tablet, TAKE 1 TABLET BY MOUTH 2 TIMES DAILY.  Current Outpatient Medications (Other):  Marland Kitchen  ALPRAZolam (XANAX) 0.5 MG tablet, TAKE 1 TABLET BY MOUTH TWICE A DAY AS NEEDED FOR ANXIETY .  cholecalciferol (VITAMIN D) 1000 units tablet, Take 1,000 Units by mouth daily. Marland Kitchen  docusate sodium (COLACE) 100 MG capsule,  Take 100 mg by mouth daily as needed for mild constipation. .  gabapentin (NEURONTIN) 100 MG capsule, TAKE 1 CAPSULE BY MOUTH THREE TIMES A DAY .  Multiple Vitamin (MULTIVITAMIN WITH MINERALS) TABS tablet, Take 1 tablet by mouth daily. .  temazepam (RESTORIL) 30 MG capsule, Take 1 capsule (30 mg total) by mouth at bedtime as needed. for sleep    Past medical history, social, surgical and family history all reviewed in electronic medical record.  No pertanent information unless stated regarding to the chief complaint.   Review of Systems:  No headache, visual changes, nausea, vomiting, diarrhea, constipation, dizziness, abdominal pain, skin rash, fevers, chills, night sweats, weight loss, swollen lymph nodes, body aches, joint swelling,, chest pain, shortness of breath, mood changes.  Positive muscle aches  Objective  Blood pressure 106/68, pulse 71, height 5\' 6"  (1.676 m), weight 190 lb (86.2 kg), SpO2 98 %.    General: No apparent distress alert and oriented x3 mood and affect normal, dressed appropriately.  HEENT: Pupils equal, extraocular movements intact  Respiratory: Patient's speak in full sentences and does not appear short of breath  Cardiovascular: No lower extremity edema, non tender, no erythema  Skin: Warm dry intact with no signs of infection or rash on extremities or on axial skeleton.  Abdomen: Soft  nontender  Neuro: Cranial nerves II through XII are intact, neurovascularly intact in all extremities with 2+ DTRs and 2+ pulses.  Lymph: No lymphadenopathy of posterior or anterior cervical chain or axillae bilaterally.  Gait normal with good balance and coordination.  MSK:  Non tender with full range of motion and good stability and symmetric strength and tone of shoulders, elbows, wrist, hip, knee and ankles bilaterally.  Knee: Right knee valgus deformity noted.  Abnormal thigh to calf ratio.  Tender to palpation over medial and PF joint line.  ROM full in flexion and extension and lower leg rotation. instability with valgus force.  painful patellar compression. Patellar glide with moderate crepitus. Patellar and quadriceps tendons unremarkable. Hamstring and quadriceps strength is normal. Contralateral knee shows mild arthritic changes  After informed written and verbal consent, patient was seated on exam table. Right knee was prepped with alcohol swab and utilizing anterolateral approach, patient's right knee space was injected with 4:1  marcaine 0.5%: Kenalog 40mg /dL. Patient tolerated the procedure well without immediate complications.   Impression and Recommendations:     This case required medical decision making of moderate complexity. The above documentation has been reviewed and is accurate and complete Lyndal Pulley, DO       Note: This dictation was prepared with Dragon dictation along with smaller phrase technology. Any transcriptional errors that result from this process are unintentional.

## 2018-09-27 ENCOUNTER — Other Ambulatory Visit: Payer: Self-pay

## 2018-09-27 ENCOUNTER — Encounter: Payer: Self-pay | Admitting: Family Medicine

## 2018-09-27 ENCOUNTER — Other Ambulatory Visit: Payer: Self-pay | Admitting: Internal Medicine

## 2018-09-27 ENCOUNTER — Ambulatory Visit (INDEPENDENT_AMBULATORY_CARE_PROVIDER_SITE_OTHER): Payer: PPO | Admitting: Family Medicine

## 2018-09-27 DIAGNOSIS — M1711 Unilateral primary osteoarthritis, right knee: Secondary | ICD-10-CM | POA: Diagnosis not present

## 2018-09-27 NOTE — Assessment & Plan Note (Signed)
Patient given injection.  Tolerated the procedure well.  Discussed icing regimen and home exercise.  Discussed which activities to do which wants to avoid.  Increase activity slowly.  Follow-up again in 4 to 8 weeks could be a candidate for Visco supplementation

## 2018-10-02 ENCOUNTER — Other Ambulatory Visit: Payer: Self-pay | Admitting: Internal Medicine

## 2018-10-04 NOTE — Telephone Encounter (Signed)
Done erx 

## 2018-10-11 ENCOUNTER — Ambulatory Visit (INDEPENDENT_AMBULATORY_CARE_PROVIDER_SITE_OTHER): Payer: PPO | Admitting: Student

## 2018-10-11 ENCOUNTER — Ambulatory Visit: Payer: PPO | Admitting: Student

## 2018-10-11 ENCOUNTER — Other Ambulatory Visit: Payer: Self-pay

## 2018-10-11 VITALS — BP 120/68 | HR 78 | Ht 66.0 in | Wt 193.0 lb

## 2018-10-11 DIAGNOSIS — I5032 Chronic diastolic (congestive) heart failure: Secondary | ICD-10-CM | POA: Diagnosis not present

## 2018-10-11 DIAGNOSIS — R0683 Snoring: Secondary | ICD-10-CM

## 2018-10-11 MED ORDER — SPIRONOLACTONE 25 MG PO TABS: 12.5000 mg | ORAL_TABLET | Freq: Every day | ORAL | 3 refills | Status: DC

## 2018-10-11 MED ORDER — FUROSEMIDE 20 MG PO TABS: 20.0000 mg | ORAL_TABLET | ORAL | 1 refills | Status: DC | PRN

## 2018-10-11 NOTE — Patient Instructions (Addendum)
Medication Instructions:   Start taking Spironolactone 12.5 mg once a day at bedtime   Start taking  Lasix 20 mg as needed   If you need a refill on your cardiac medications before your next appointment, please call your pharmacy.   Lab work:  BMET in 10 DAYS   If you have labs (blood work) drawn today and your tests are completely normal, you will receive your results only by: Marland Kitchen MyChart Message (if you have MyChart) OR . A paper copy in the mail If you have any lab test that is abnormal or we need to change your treatment, we will call you to review the results.  Testing/Procedures: Your physician has recommended that you have a sleep study. This test records several body functions during sleep, including: brain activity, eye movement, oxygen and carbon dioxide blood levels, heart rate and rhythm, breathing rate and rhythm, the flow of air through your mouth and nose, snoring, body muscle movements, and chest and belly movement.  Follow-Up: IN 6 WEEKS WITH TILLERY PA-C   Any Other Special Instructions Will Be Listed Below (If Applicable).

## 2018-10-11 NOTE — Progress Notes (Signed)
PCP:  [name], MD Primary Cardiologist: No primary care provider on file. Electrophysiologist: None   Scott [name] is a 63 y.o. male with PMH of PAF, HTN, and remote tachy induced CMP who presents today for routine electrophysiology followup. They are seen for Dr Lovena Le.   Since last being seen in our clinic, the patient reports new complaints of dyspnea on exertion.  He says this has been gradual over the past 6-8 months.    He reports DOE with moderate exertion, orthopnea, and bendopnea. Pt has sleep apnea but "threw away" his machine long ago due to a malfunction per pt. He walks 7-8 miles on the weekends with his dog, but at a slow pace. He is SOB walking up hills, steps, or working outside. He denies SOB with ADLs. On a hot day, he states he will drink 8-10 "small" bottles of water. He denies exertional chest pain. He denies symptoms of palpitations, chest pain, PND, lower extremity edema, claudication, dizziness, presyncope, syncope, bleeding, or neurologic sequela.   Past Medical History:  Diagnosis Date  . ALLERGIC RHINITIS 01/14/2007  . ANXIETY 09/28/2006  . Asbestos exposure   . Atrial fibrillation ([place]) 09/28/2006   s/p afib ablation x 2  . CHF (congestive heart failure) ([place])   . COPD (chronic obstructive pulmonary disease) ([place])   . Coronary artery disease 11/24/2011  . Diastolic dysfunction 04/30/6281  . Dyspnea    W/ EXERTION   . Dysrhythmia    AFIB     . Gallstones   . HYPERLIPIDEMIA 09/28/2006  . HYPERTENSION 09/28/2006  . HYPOTHYROIDISM, ACQUIRED NEC 09/28/2006   pt denies and doesn't know  . Long term current use of anticoagulant 04/08/2010  . OBSTRUCTIVE SLEEP APNEA 01/06/2008   NOT USING    . Right knee DJD 07/09/2010  . S/P CABG x 1 11/26/2011   Right internal mammary artery to right coronary artery  . S/P Maze operation for atrial fibrillation 11/26/2011   complete biatrial lesion set with clipping of LA appendage   Past Surgical History:  Procedure Laterality  Date  . ANTERIOR CERVICAL DECOMP/DISCECTOMY FUSION N/A 06/20/2016   Procedure: Anterior Cervical Discectomy Fusion - Cervical five-Cervical six - Cervical six-Cervical seven - Cervicla seven- Thoracic one;  Surgeon: Earnie Larsson, MD;  Location: [place];  Service: Neurosurgery;  Laterality: N/A;  . CARDIOVERSION     X4   . CERVICAL FUSION  06/20/2016   C5-6, C6-7, C7-T1 anterior cervical discectomy with interbody fusion utilizing interbody peek cages, locally harvested autograft, and anterior plate instrumentation  . CORONARY ARTERY BYPASS GRAFT  11/26/2011   Procedure: CORONARY ARTERY BYPASS GRAFTING (CABG);  Surgeon: Rexene Alberts, MD;  Location: [place];  Service: Open Heart Surgery;  Laterality: N/A;  coronary artery bypass on pump times one utilizing the right internal mammary artery, transesophageal echocardiogram   . hand surgury Left   . LEFT HEART CATHETERIZATION WITH CORONARY ANGIOGRAM N/A 11/24/2011   Procedure: LEFT HEART CATHETERIZATION WITH CORONARY ANGIOGRAM;  Surgeon: Peter M Martinique, MD;  Location: [facility] CATH LAB;  Service: Cardiovascular;  Laterality: N/A;  . LOOP RECORDER IMPLANT N/A 07/06/2012   Procedure: LOOP RECORDER IMPLANT;  Surgeon: Thompson Grayer, MD;  Location: [facility] CATH LAB;  Service: Cardiovascular;  Laterality: N/A;  . MAZE  11/26/2011   Procedure: MAZE;  Surgeon: Rexene Alberts, MD;  Location: [place];  Service: Open Heart Surgery;  Laterality: N/A;  Cryomaze   . Radiofrequency Ablation for atrial fibrillation  12/30/07 and  06/05/09   afib ablation x 2 by JA  . Radiofrequency ablation for atrial flutter  2005   CTI    Current Outpatient Medications  Medication Sig Dispense Refill  . acetaminophen (TYLENOL) 500 MG tablet Take 1,000 mg by mouth every 6 (six) hours as needed for mild pain.     Marland Kitchen ALPRAZolam (XANAX) 0.5 MG tablet TAKE 1 TABLET BY MOUTH TWICE A DAY AS NEEDED FOR ANXIETY 180 tablet 1  . budesonide-formoterol (SYMBICORT) 160-4.5 MCG/ACT inhaler TAKE 2 PUFFS BY MOUTH  TWICE A DAY 30.6 Inhaler 4  . cholecalciferol (VITAMIN D) 1000 units tablet Take 1,000 Units by mouth daily.    Marland Kitchen docusate sodium (COLACE) 100 MG capsule Take 100 mg by mouth daily as needed for mild constipation.    Marland Kitchen ELIQUIS 5 MG TABS tablet TAKE 1 TABLET BY MOUTH 2 TIMES DAILY. 180 tablet 1  . Fluticasone-Umeclidin-Vilant (TRELEGY ELLIPTA) 100-62.5-25 MCG/INH AEPB Inhale 1 puff into the lungs daily. 1 each 11  . gabapentin (NEURONTIN) 100 MG capsule TAKE 1 CAPSULE BY MOUTH THREE TIMES A DAY 270 capsule 2  . lovastatin (MEVACOR) 40 MG tablet TAKE 2 TABLETS (80 MG TOTAL) BY MOUTH EVERY EVENING. 180 tablet 2  . metoprolol succinate (TOPROL-XL) 50 MG 24 hr tablet TAKE ONE AND ONE HALF TABLETS BY MOUTH DAILY 135 tablet 1  . Multiple Vitamin (MULTIVITAMIN WITH MINERALS) TABS tablet Take 1 tablet by mouth daily.    . Olmesartan-amLODIPine-HCTZ 40-5-12.5 MG TABS TAKE 1 TABLET BY MOUTH EVERY DAY 90 tablet 1  . PROAIR HFA 108 (90 Base) MCG/ACT inhaler TAKE 2 PUFFS BY MOUTH EVERY 6 HOURS AS NEEDED FOR WHEEZE OR SHORTNESS OF BREATH 8.5 Inhaler 11  . temazepam (RESTORIL) 30 MG capsule Take 1 capsule (30 mg total) by mouth at bedtime as needed. for sleep 90 capsule 1  . traMADol (ULTRAM) 50 MG tablet TAKE 1 TABLET BY MOUTH EVERY 6 HOURS AS NEEDED 120 tablet 2   No current facility-administered medications for this visit.     Allergies  Allergen Reactions  . Zolpidem Tartrate Other (See Comments)    Extremely drowsy the next day    Social History   Socioeconomic History  . Marital status: Single    Spouse name: Not on file  . Number of children: 0  . Years of education: Not on file  . Highest education level: Not on file  Occupational History  . Occupation: worked previously in the [employer] and feels that he was exposed to asbestos.    Employer: [employer]  Social Needs  . Financial resource strain: Not hard at all  . Food insecurity    Worry: Never true    Inability: Never  true  . Transportation needs    Medical: No    Non-medical: No  Tobacco Use  . Smoking status: Former Smoker    Packs/day: 1.50    Years: 26.00    Pack years: 39.00    Types: Cigarettes    Quit date: 04/06/1995    Years since quitting: 23.5  . Smokeless tobacco: Never Used  Substance and Sexual Activity  . Alcohol use: No    Alcohol/week: 0.0 standard drinks  . Drug use: No  . Sexual activity: Yes  Lifestyle  . Physical activity    Days per week: 4 days    Minutes per session: 50 min  . Stress: Not at all  Relationships  . Social Herbalist on phone:  More than three times a week    Gets together: More than three times a week    Attends religious service: Never    Active member of club or organization: Yes    Attends meetings of clubs or organizations: More than 4 times per year    Relationship status: Not on file  . Intimate partner violence    Fear of current or ex partner: Not on file    Emotionally abused: Not on file    Physically abused: Not on file    Forced sexual activity: Not on file  Other Topics Concern  . Not on file  Social History Narrative   Pt lives in [place], [place] with his friend.      Review of Systems: General: No chills, fever, night sweats or weight changes  Cardiovascular:  No chest pain, dyspnea on exertion, edema, orthopnea, palpitations, paroxysmal nocturnal dyspnea Dermatological: No rash, lesions or masses Respiratory: No cough, dyspnea Urologic: No hematuria, dysuria Abdominal: No nausea, vomiting, diarrhea, bright red blood per rectum, melena, or hematemesis Neurologic: No visual changes, weakness, changes in mental status All other systems reviewed and are otherwise negative except as noted above.  Physical Exam: Vitals:   10/11/18 1129  BP: 120/68  Pulse: 78  Weight: 193 lb (87.5 kg)  Height: 5\' 6"  (1.676 m)    GEN- The patient is well appearing, alert and oriented x 3 today.   HEENT: normocephalic, atraumatic;  sclera clear, conjunctiva pink; hearing intact; oropharynx clear; neck supple, no JVP Lymph- no cervical lymphadenopathy Lungs- Clear to ausculation bilaterally, normal work of breathing.  No wheezes, rales, rhonchi Heart- Regular rate and rhythm, no murmurs, rubs or gallops, PMI not laterally displaced GI- soft, non-tender, non-distended, bowel sounds present, no hepatosplenomegaly Extremities- no clubbing, cyanosis, or edema; DP/PT/radial pulses 2+ bilaterally MS- no significant deformity or atrophy Skin- warm and dry, no rash or lesion Psych- euthymic mood, full affect Neuro- strength and sensation are intact  EKG is ordered today. Personal review shows NSR at 78 bpm with 1st degree AV block  Assessment and Plan:  1. PAF EKG today shows NSR He denies bleeding on Eliquis  2. HTN Controlled on current regimen  3. Chronic systolic CHF  EF reduced to 45/50% on 09/13/2018 compared to previous Echo Continue toprol XL  Continue olmesartan Will start spironolactone 12.5 mg daily. BMET 10 days.  Will give lasix 20 mg to take AS NEEDED.  Encouraged fluid and sodium restriction. Encouraged to look at [employer].   4. OSA He has a distant history of sleep apnea. He wore a CPAP "until it broke" and then threw it away several years ago.  Will order for repeat sleep study.   Disposition: With med changes, will see back in 6 weeks for further med titration.    Shirley Friar, PA-C  10/11/18 11:36 AM

## 2018-10-12 ENCOUNTER — Telehealth: Payer: Self-pay | Admitting: *Deleted

## 2018-10-12 DIAGNOSIS — I440 Atrioventricular block, first degree: Secondary | ICD-10-CM

## 2018-10-12 DIAGNOSIS — R0683 Snoring: Secondary | ICD-10-CM

## 2018-10-12 DIAGNOSIS — G4733 Obstructive sleep apnea (adult) (pediatric): Secondary | ICD-10-CM

## 2018-10-12 HISTORY — DX: Atrioventricular block, first degree: I44.0

## 2018-10-12 NOTE — Telephone Encounter (Signed)
-----   Message from Claude Manges, [place] sent at 10/11/2018  3:22 PM EDT ----- Regarding: PATIENT NEEDS SLEEP STUDY

## 2018-10-14 ENCOUNTER — Other Ambulatory Visit: Payer: Self-pay | Admitting: Internal Medicine

## 2018-10-15 NOTE — Progress Notes (Signed)
Subjective:   Scott [name] is a 63 y.o. male who presents for Medicare Annual/Subsequent preventive examination. I connected with patient by a telephone and verified that I am speaking with the correct person using two identifiers. Patient stated full name and DOB. Patient gave permission to continue with telephonic visit. Patient's location was at home and Nurse's location was at [place] office.   Review of Systems:     Sleep patterns: feels rested on waking, gets up 1-2 times nightly to void and sleeps 6-7 hours nightly.    Home Safety/Smoke Alarms: Feels safe in home. Smoke alarms in place.  Living environment; residence and Firearm Safety: Scott [name], apartment, no firearms, firearms stored safely. Lives alone, no needs for DME, good support system Seat Belt Safety/Bike Helmet: Wears seat belt.   PSA-  Lab Results  Component Value Date   PSA 1.27 03/05/2018   PSA 1.64 08/28/2017   PSA 0.72 05/15/2016       Objective:    Vitals: There were no vitals taken for this visit.  There is no height or weight on file to calculate BMI.  Advanced Directives 10/09/2017 06/20/2016 06/13/2016 03/21/2016 12/14/2013 07/06/2012 12/08/2011  Does Patient Have a Medical Advance Directive? Yes Yes Yes Yes No Patient has advance directive, copy not in chart Patient has advance directive, copy not in chart  Type of Advance Directive [place];Living will [place];Living will [place];Living will [place];Living will - [place];Living will Living will  Does patient want to make changes to medical advance directive? - No - Patient declined - - - - -  Copy of [place] in Chart? No - copy requested No - copy requested Yes No - copy requested - - -  Would patient like information on creating a medical advance directive? - - - - No - patient declined information - -  Pre-existing out of  facility DNR order (yellow form or pink MOST form) - - - - - No No    Tobacco Social History   Tobacco Use  Smoking Status Former Smoker  . Packs/day: 1.50  . Years: 26.00  . Pack years: 39.00  . Types: Cigarettes  . Quit date: 04/06/1995  . Years since quitting: 23.5  Smokeless Tobacco Never Used     Counseling given: Not Answered  Past Medical History:  Diagnosis Date  . ALLERGIC RHINITIS 01/14/2007  . ANXIETY 09/28/2006  . Asbestos exposure   . Atrial fibrillation ([place]) 09/28/2006   s/p afib ablation x 2  . CHF (congestive heart failure) ([place])   . COPD (chronic obstructive pulmonary disease) ([place])   . Coronary artery disease 11/24/2011  . Diastolic dysfunction 99991111  . Dyspnea    W/ EXERTION   . Dysrhythmia    AFIB     . Gallstones   . HYPERLIPIDEMIA 09/28/2006  . HYPERTENSION 09/28/2006  . HYPOTHYROIDISM, ACQUIRED NEC 09/28/2006   pt denies and doesn't know  . Long term current use of anticoagulant 04/08/2010  . OBSTRUCTIVE SLEEP APNEA 01/06/2008   NOT USING    . Right knee DJD 07/09/2010  . S/P CABG x 1 11/26/2011   Right internal mammary artery to right coronary artery  . S/P Maze operation for atrial fibrillation 11/26/2011   complete biatrial lesion set with clipping of LA appendage   Past Surgical History:  Procedure Laterality Date  . ANTERIOR CERVICAL DECOMP/DISCECTOMY FUSION N/A 06/20/2016   Procedure:  Anterior Cervical Discectomy Fusion - Cervical five-Cervical six - Cervical six-Cervical seven - Cervicla seven- Thoracic one;  Surgeon: Earnie Larsson, MD;  Location: [place];  Service: Neurosurgery;  Laterality: N/A;  . CARDIOVERSION     X4   . CERVICAL FUSION  06/20/2016   C5-6, C6-7, C7-T1 anterior cervical discectomy with interbody fusion utilizing interbody peek cages, locally harvested autograft, and anterior plate instrumentation  . CORONARY ARTERY BYPASS GRAFT  11/26/2011   Procedure: CORONARY ARTERY BYPASS GRAFTING (CABG);  Surgeon: Rexene Alberts, MD;   Location: [place];  Service: Open Heart Surgery;  Laterality: N/A;  coronary artery bypass on pump times one utilizing the right internal mammary artery, transesophageal echocardiogram   . hand surgury Left   . LEFT HEART CATHETERIZATION WITH CORONARY ANGIOGRAM N/A 11/24/2011   Procedure: LEFT HEART CATHETERIZATION WITH CORONARY ANGIOGRAM;  Surgeon: Peter M Martinique, MD;  Location: [facility] CATH LAB;  Service: Cardiovascular;  Laterality: N/A;  . LOOP RECORDER IMPLANT N/A 07/06/2012   Procedure: LOOP RECORDER IMPLANT;  Surgeon: Thompson Grayer, MD;  Location[facility] CATH LAB;  Service: Cardiovascular;  Laterality: N/A;  . MAZE  11/26/2011   Procedure: MAZE;  Surgeon: Rexene Alberts, MD;  Location: [place];  Service: Open Heart Surgery;  Laterality: N/A;  Cryomaze   . Radiofrequency Ablation for atrial fibrillation  12/30/07 and 06/05/09   afib ablation x 2 by JA  . Radiofrequency ablation for atrial flutter  2005   CTI   Family History  Problem Relation Age of Onset  . Dementia Father   . Hypertension Father   . Atrial fibrillation Mother   . Stroke Mother   . Dementia Paternal Grandfather   . Cancer Paternal Grandmother        type unknown   Social History   Socioeconomic History  . Marital status: Single    Spouse name: Not on file  . Number of children: 0  . Years of education: Not on file  . Highest education level: Not on file  Occupational History  . Occupation: worked previously in the [employer] and feels that he was exposed to asbestos.    Employer: [employer]  Social Needs  . Financial resource strain: Not hard at all  . Food insecurity    Worry: Never true    Inability: Never true  . Transportation needs    Medical: No    Non-medical: No  Tobacco Use  . Smoking status: Former Smoker    Packs/day: 1.50    Years: 26.00    Pack years: 39.00    Types: Cigarettes    Quit date: 04/06/1995    Years since quitting: 23.5  . Smokeless tobacco: Never Used  Substance and  Sexual Activity  . Alcohol use: No    Alcohol/week: 0.0 standard drinks  . Drug use: No  . Sexual activity: Yes  Lifestyle  . Physical activity    Days per week: 4 days    Minutes per session: 50 min  . Stress: Not at all  Relationships  . Social connections    Talks on phone: More than three times a week    Gets together: More than three times a week    Attends religious service: Never    Active member of club or organization: Yes    Attends meetings of clubs or organizations: More than 4 times per year    Relationship status: Not on file  Other Topics Concern  . Not on  file  Social History Narrative   Pt lives in [place], [place] with his friend.     Outpatient Encounter Medications as of 10/18/2018  Medication Sig  . acetaminophen (TYLENOL) 500 MG tablet Take 1,000 mg by mouth every 6 (six) hours as needed for mild pain.   Marland Kitchen ALPRAZolam (XANAX) 0.5 MG tablet TAKE 1 TABLET BY MOUTH TWICE A DAY AS NEEDED FOR ANXIETY  . budesonide-formoterol (SYMBICORT) 160-4.5 MCG/ACT inhaler TAKE 2 PUFFS BY MOUTH TWICE A DAY  . cholecalciferol (VITAMIN D) 1000 units tablet Take 1,000 Units by mouth daily.  Marland Kitchen docusate sodium (COLACE) 100 MG capsule Take 100 mg by mouth daily as needed for mild constipation.  Marland Kitchen ELIQUIS 5 MG TABS tablet TAKE 1 TABLET BY MOUTH 2 TIMES DAILY.  Marland Kitchen Fluticasone-Umeclidin-Vilant (TRELEGY ELLIPTA) 100-62.5-25 MCG/INH AEPB Inhale 1 puff into the lungs daily.  . furosemide (LASIX) 20 MG tablet Take 1 tablet (20 mg total) by mouth as needed (SWELLING).  Marland Kitchen gabapentin (NEURONTIN) 100 MG capsule TAKE 1 CAPSULE BY MOUTH THREE TIMES A DAY  . lovastatin (MEVACOR) 40 MG tablet TAKE 2 TABLETS (80 MG TOTAL) BY MOUTH EVERY EVENING.  . metoprolol succinate (TOPROL-XL) 50 MG 24 hr tablet TAKE ONE AND ONE HALF TABLETS BY MOUTH DAILY  . Multiple Vitamin (MULTIVITAMIN WITH MINERALS) TABS tablet Take 1 tablet by mouth daily.  . Olmesartan-amLODIPine-HCTZ 40-5-12.5 MG TABS TAKE 1 TABLET BY  MOUTH EVERY DAY  . PROAIR HFA 108 (90 Base) MCG/ACT inhaler TAKE 2 PUFFS BY MOUTH EVERY 6 HOURS AS NEEDED FOR WHEEZE OR SHORTNESS OF BREATH  . spironolactone (ALDACTONE) 25 MG tablet Take 0.5 tablets (12.5 mg total) by mouth at bedtime.  . temazepam (RESTORIL) 30 MG capsule Take 1 capsule (30 mg total) by mouth at bedtime as needed. for sleep  . traMADol (ULTRAM) 50 MG tablet TAKE 1 TABLET BY MOUTH EVERY 6 HOURS AS NEEDED  . [DISCONTINUED] lovastatin (MEVACOR) 40 MG tablet TAKE 2 TABLETS (80 MG TOTAL) BY MOUTH EVERY EVENING.   No facility-administered encounter medications on file as of 10/18/2018.     Activities of Daily Living No flowsheet data found.  Patient Care Team: [name], MD as PCP - General Jacqualyn Posey Bonna Gains, DPM as Consulting Physician (Podiatry) Evans Lance, MD as Consulting Physician (Cardiology) Irene Shipper, MD as Consulting Physician (Gastroenterology)   Assessment:   This is a routine wellness examination for [place]. Physical assessment deferred to PCP.   Exercise Activities and Dietary recommendations   Diet (meal preparation, eat out, water intake, caffeinated beverages, dairy products, fruits and vegetables): in general, a "healthy" diet  , well balanced. eats a variety of fruits and vegetables daily, limits salt, fat/cholesterol, sugar,carbohydrates,caffeine, drinks 6-8 glasses of water daily.  Goals    . Patient Stated     Maintain current health status.    . Weight (lb) < 155 lb (70.3 kg)       Fall Risk Fall Risk  03/05/2018 10/09/2017 08/28/2017 02/27/2017 11/11/2016  Falls in the past year? 1 Yes Yes Yes Yes  Number falls in past yr: 1 1 1 2  or more -  Comment tripped and falls over new puppy - - - -  Injury with Fall? 0 No No No -  Risk Factor Category  - - - - -  Comment - - - - -  Risk for fall due to : - Impaired mobility;Impaired balance/gait - - -  Follow up - Falls prevention discussed - - -  Depression Screen PHQ 2/9 Scores  03/05/2018 10/09/2017 02/27/2017 11/11/2016  PHQ - 2 Score 0 0 0 0    Cognitive Function MMSE - Mini Mental State Exam 10/09/2017 03/21/2016  Not completed: Refused -  Orientation to time - 4  Orientation to Place - 5  Registration - 3  Attention/ Calculation - 5  Recall - 2  Language- name 2 objects - 2  Language- repeat - 1  Language- follow 3 step command - 3  Language- read & follow direction - 1  Write a sentence - 1  Copy design - 1  Total score - 28       Ad8 score reviewed for issues:  Issues making decisions: no  Less interest in hobbies / activities: no  Repeats questions, stories (family complaining): no  Trouble using ordinary gadgets (microwave, computer, phone):no  Forgets the month or year: no  Mismanaging finances: no  Remembering appts: no  Daily problems with thinking and/or memory: no Ad8 score is= 0  Immunization History  Administered Date(s) Administered  . Influenza Split 11/25/2011  . Influenza Whole 01/13/2005, 11/26/2007, 12/06/2008, 11/29/2009  . Influenza,inj,Quad PF,6+ Mos 03/08/2015  . Influenza-Unspecified 10/25/2012, 11/21/2015, 11/25/2015, 12/25/2016  . Pneumococcal Conjugate-13 12/21/2015  . Pneumococcal Polysaccharide-23 03/11/2011  . Td 03/27/2004, 04/04/2014  . Zoster 01/04/2016    Screening Tests Health Maintenance  Topic Date Due  . COLONOSCOPY  04/05/2005  . INFLUENZA VACCINE  09/25/2018  . TETANUS/TDAP  04/04/2024  . Hepatitis C Screening  Completed  . HIV Screening  Completed       Plan:    Reviewed health maintenance screenings with patient today and relevant education, vaccines, and/or referrals were provided.   Continue to eat heart healthy diet (full of fruits, vegetables, whole grains, lean protein, water--limit salt, fat, and sugar intake) and increase physical activity as tolerated.  I have personally reviewed and noted the following in the patient's chart:   . Medical and social history . Use of alcohol,  tobacco or illicit drugs  . Current medications and supplements . Functional ability and status . Nutritional status . Physical activity . Advanced directives . List of other physicians . Screenings to include cognitive, depression, and falls . Referrals and appointments  In addition, I have reviewed and discussed with patient certain preventive protocols, quality metrics, and best practice recommendations. A written personalized care plan for preventive services as well as general preventive health recommendations were provided to patient.     Michiel Cowboy, RN  10/15/2018

## 2018-10-17 ENCOUNTER — Other Ambulatory Visit: Payer: Self-pay | Admitting: Internal Medicine

## 2018-10-18 ENCOUNTER — Other Ambulatory Visit: Payer: Self-pay

## 2018-10-18 ENCOUNTER — Ambulatory Visit (INDEPENDENT_AMBULATORY_CARE_PROVIDER_SITE_OTHER): Payer: PPO | Admitting: Family Medicine

## 2018-10-18 ENCOUNTER — Encounter: Payer: Self-pay | Admitting: Family Medicine

## 2018-10-18 ENCOUNTER — Ambulatory Visit (INDEPENDENT_AMBULATORY_CARE_PROVIDER_SITE_OTHER): Payer: PPO | Admitting: *Deleted

## 2018-10-18 DIAGNOSIS — Z0000 Encounter for general adult medical examination without abnormal findings: Secondary | ICD-10-CM | POA: Diagnosis not present

## 2018-10-18 DIAGNOSIS — M1711 Unilateral primary osteoarthritis, right knee: Secondary | ICD-10-CM

## 2018-10-18 NOTE — Patient Instructions (Addendum)
Continue to eat heart healthy diet (full of fruits, vegetables, whole grains, lean protein, water--limit salt, fat, and sugar intake) and increase physical activity as tolerated.  Continue doing brain stimulating activities (puzzles, reading, adult coloring books, staying active) to keep memory sharp.   Scott [name] , Thank you for taking time to come for your Medicare Wellness Visit. I appreciate your ongoing commitment to your health goals. Please review the following plan we discussed and let me know if I can assist you in the future.   These are the goals we discussed: Goals    . Patient Stated     Maintain current health status.    . Patient Stated    . Weight (lb) < 155 lb (70.3 kg)       This is a list of the screening recommended for you and due dates:  Health Maintenance  Topic Date Due  . Colon Cancer Screening  04/05/2005  . Flu Shot  09/25/2018  . Tetanus Vaccine  04/04/2024  .  Hepatitis C: One time screening is recommended by Center for Disease Control  (CDC) for  adults born from [birth year] through 1965.   Completed  . HIV Screening  Completed     Health Maintenance, Male Adopting a healthy lifestyle and getting preventive care are important in promoting health and wellness. Ask your health care provider about:  The right schedule for you to have regular tests and exams.  Things you can do on your own to prevent diseases and keep yourself healthy. What should I know about diet, weight, and exercise? Eat a healthy diet   Eat a diet that includes plenty of vegetables, fruits, low-fat dairy products, and lean protein.  Do not eat a lot of foods that are high in solid fats, added sugars, or sodium. Maintain a healthy weight Body mass index (BMI) is a measurement that can be used to identify possible weight problems. It estimates body fat based on height and weight. Your health care provider can help determine your BMI and help you achieve or maintain a healthy weight. Get  regular exercise Get regular exercise. This is one of the most important things you can do for your health. Most adults should:  Exercise for at least 150 minutes each week. The exercise should increase your heart rate and make you sweat (moderate-intensity exercise).  Do strengthening exercises at least twice a week. This is in addition to the moderate-intensity exercise.  Spend less time sitting. Even light physical activity can be beneficial. Watch cholesterol and blood lipids Have your blood tested for lipids and cholesterol at 63 years of age, then have this test every 5 years. You may need to have your cholesterol levels checked more often if:  Your lipid or cholesterol levels are high.  You are older than 63 years of age.  You are at high risk for heart disease. What should I know about cancer screening? Many types of cancers can be detected early and may often be prevented. Depending on your health history and family history, you may need to have cancer screening at various ages. This may include screening for:  Colorectal cancer.  Prostate cancer.  Skin cancer.  Lung cancer. What should I know about heart disease, diabetes, and high blood pressure? Blood pressure and heart disease  High blood pressure causes heart disease and increases the risk of stroke. This is more likely to develop in people who have high blood pressure readings, are of African descent, or are  overweight.  Talk with your health care provider about your target blood pressure readings.  Have your blood pressure checked: ? Every 3-5 years if you are 1-66 years of age. ? Every year if you are 12 years old or older.  If you are between the ages of 45 and 60 and are a current or former smoker, ask your health care provider if you should have a one-time screening for abdominal aortic aneurysm (AAA). Diabetes Have regular diabetes screenings. This checks your fasting blood sugar level. Have the screening  done:  Once every three years after age 4 if you are at a normal weight and have a low risk for diabetes.  More often and at a younger age if you are overweight or have a high risk for diabetes. What should I know about preventing infection? Hepatitis B If you have a higher risk for hepatitis B, you should be screened for this virus. Talk with your health care provider to find out if you are at risk for hepatitis B infection. Hepatitis C Blood testing is recommended for:  Everyone born from [birth year] through 1965.  Anyone with known risk factors for hepatitis C. Sexually transmitted infections (STIs)  You should be screened each year for STIs, including gonorrhea and chlamydia, if: ? You are sexually active and are younger than 63 years of age. ? You are older than 63 years of age and your health care provider tells you that you are at risk for this type of infection. ? Your sexual activity has changed since you were last screened, and you are at increased risk for chlamydia or gonorrhea. Ask your health care provider if you are at risk.  Ask your health care provider about whether you are at high risk for HIV. Your health care provider may recommend a prescription medicine to help prevent HIV infection. If you choose to take medicine to prevent HIV, you should first get tested for HIV. You should then be tested every 3 months for as long as you are taking the medicine. Follow these instructions at home: Lifestyle  Do not use any products that contain nicotine or tobacco, such as cigarettes, e-cigarettes, and chewing tobacco. If you need help quitting, ask your health care provider.  Do not use street drugs.  Do not share needles.  Ask your health care provider for help if you need support or information about quitting drugs. Alcohol use  Do not drink alcohol if your health care provider tells you not to drink.  If you drink alcohol: ? Limit how much you have to 0-2 drinks a  day. ? Be aware of how much alcohol is in your drink. In the [place] one drink equals one 12 oz bottle of beer (355 mL), one 5 oz glass of  (148 mL), or one 1 oz glass of hard liquor (44 mL). General instructions  Schedule regular health, dental, and eye exams.  Stay current with your vaccines.  Tell your health care provider if: ? You often feel depressed. ? You have ever been abused or do not feel safe at home. Summary  Adopting a healthy lifestyle and getting preventive care are important in promoting health and wellness.  Follow your health care provider's instructions about healthy diet, exercising, and getting tested or screened for diseases.  Follow your health care provider's instructions on monitoring your cholesterol and blood pressure. This information is not intended to replace advice given to you by your health care provider. Make sure you discuss  any questions you have with your health care provider. Document Released: 08/09/2007 Document Revised: 02/03/2018 Document Reviewed: 02/03/2018 Elsevier Patient Education  2020 Elsevier Inc.  

## 2018-10-18 NOTE — Assessment & Plan Note (Signed)
Patient given Visco supplementation today.  Tolerated the procedure well.  Home exercises, which activities are doing which was.  Follow-up again in 4 to 8 weeks

## 2018-10-18 NOTE — Patient Instructions (Signed)
Good to see you  Will take some time for it to work  Ice 20 minutes 2 times daily. Usually after activity and before bed. Exercises 3 times a week.  See me again in 6 weeks

## 2018-10-18 NOTE — Progress Notes (Signed)
Medical screening examination/treatment/procedure(s) were performed by non-physician practitioner and as supervising physician I was immediately available for consultation/collaboration. I agree with above. Juanangel Soderholm A Alizaya Oshea, MD 

## 2018-10-18 NOTE — Progress Notes (Signed)
Scott [name], Scott [name] [id number] Phone: [phone] Subjective:   Scott [name], am serving as a scribe for Dr. Hulan Saas.   CC: Knee pain follow-up  [id number]   09/27/2018 Patient given injection.  Tolerated the procedure well.  Discussed icing regimen and home exercise.  Discussed which activities to do which wants to avoid.  Increase activity slowly.  Follow-up again in 4 to 8 weeks could be a candidate for Visco supplementation  Update 10/18/2018 Patient has been having right knee pain. Is still having pain. Injection did not provide any relief.  worsenign pain again.  Discussed HEP which he is only been doing intermittently.     Past Medical History:  Diagnosis Date   ALLERGIC RHINITIS 01/14/2007   ANXIETY 09/28/2006   Asbestos exposure    Atrial fibrillation ([place]) 09/28/2006   s/p afib ablation x 2   CHF (congestive heart failure) ([facility])    COPD (chronic obstructive pulmonary disease) ([place])    Coronary artery disease [id number]   Diastolic dysfunction [id number]   Dyspnea    W/ EXERTION    Dysrhythmia    AFIB      Gallstones    HYPERLIPIDEMIA 09/28/2006   HYPERTENSION 09/28/2006   HYPOTHYROIDISM, ACQUIRED NEC 09/28/2006   pt denies and doesn't know   Long term current use of anticoagulant 04/08/2010   OBSTRUCTIVE SLEEP APNEA 01/06/2008   NOT USING     Right knee DJD 07/09/2010   S/P CABG x 1 11/26/2011   Right internal mammary artery to right coronary artery   S/P Maze operation for atrial fibrillation 11/26/2011   complete biatrial lesion set with clipping of LA appendage   Past Surgical History:  Procedure Laterality Date   ANTERIOR CERVICAL DECOMP/DISCECTOMY FUSION N/A 06/20/2016   Procedure: Anterior Cervical Discectomy Fusion - Cervical five-Cervical six - Cervical six-Cervical seven - Cervicla seven- Thoracic one;  Surgeon: Scott Larsson, MD;  Location: [facility];  Service: Neurosurgery;  Laterality:  N/A;   CARDIOVERSION     X4    CERVICAL FUSION  06/20/2016   C5-6, C6-7, C7-T1 anterior cervical discectomy with interbody fusion utilizing interbody peek cages, locally harvested autograft, and anterior plate instrumentation   CORONARY ARTERY BYPASS GRAFT  11/26/2011   Procedure: CORONARY ARTERY BYPASS GRAFTING (CABG);  Surgeon: Scott Alberts, MD;  Location: [facility];  Service: Open Heart Surgery;  Laterality: N/A;  coronary artery bypass on pump times one utilizing the right internal mammary artery, transesophageal echocardiogram    hand surgury Left    LEFT HEART CATHETERIZATION WITH CORONARY ANGIOGRAM N/A 11/24/2011   Procedure: LEFT HEART CATHETERIZATION WITH CORONARY ANGIOGRAM;  Surgeon: Scott M Martinique, MD;  Location: [facility] CATH LAB;  Service: Cardiovascular;  Laterality: N/A;   LOOP RECORDER IMPLANT N/A 07/06/2012   Procedure: LOOP RECORDER IMPLANT;  Surgeon: Scott Grayer, MD;  Location: [facility] CATH LAB;  Service: Cardiovascular;  Laterality: N/A;   MAZE  11/26/2011   Procedure: MAZE;  Surgeon: Scott Alberts, MD;  Location: [facility];  Service: Open Heart Surgery;  Laterality: N/A;  [place]omaze    Radiofrequency Ablation for atrial fibrillation  12/30/07 and 06/05/09   afib ablation x 2 by JA   Radiofrequency ablation for atrial flutter  2005   [facility]   Social History   Socioeconomic History   Marital status: Single    Spouse name: Not on file   Number of children: 0   Years of education: Not on  file   Highest education level: Not on file  Occupational History   Occupation: works at the [employer] part-time  Social Needs   Financial resource strain: Not hard at all   Food insecurity    Worry: Never true    Inability: Never true   Transportation needs    Medical: [name]    Non-medical: [name]  Tobacco Use   Smoking status: Former Smoker    Packs/day: 1.50    Years: 26.00    Pack years: 39.00    Types: Cigarettes    Quit date: 04/06/1995    Years since quitting: 23.5   Smokeless  tobacco: Never Used  Substance and Sexual Activity   Alcohol use: [name]    Alcohol/week: 0.0 standard drinks   Drug use: [name]   Sexual activity: Yes  Lifestyle   Physical activity    Days per week: 5 days    Minutes per session: 80 min   Stress: Not at all  Relationships   Social connections    Talks on phone: More than three times a week    Gets together: More than three times a week    Attends religious service: Never    Active member of club or organization: Yes    Attends meetings of clubs or organizations: More than 4 times per year    Relationship status: Not on file  Other Topics Concern   Not on file  Social History Narrative   Pt lives in [place], [place] with his friend.    Allergies  Allergen Reactions   Zolpidem Tartrate Other (See Comments)    Extremely drowsy the next day   Family History  Problem Relation Age of Onset   Dementia Father    Hypertension Father    Atrial fibrillation Mother    Stroke Mother    Dementia Paternal Grandfather    Cancer Paternal Grandmother        type unknown     Current Outpatient Medications (Cardiovascular):    furosemide (LASIX) 20 MG tablet, Take 1 tablet (20 mg total) by mouth as needed (SWELLING).   lovastatin (MEVACOR) 40 MG tablet, TAKE 2 TABLETS (80 MG TOTAL) BY MOUTH EVERY EVENING.   metoprolol succinate (TOPROL-XL) 50 MG 24 hr tablet, TAKE ONE AND ONE HALF TABLETS BY MOUTH DAILY   Olmesartan-amLODIPine-HCTZ 40-5-12.5 MG TABS, TAKE 1 TABLET BY MOUTH EVERY DAY   omega-3 acid ethyl esters (LOVAZA) 1 g capsule, Take 1 g by mouth daily.   spironolactone (ALDACTONE) 25 MG tablet, Take 0.5 tablets (12.5 mg total) by mouth at bedtime.  Current Outpatient Medications (Respiratory):    budesonide-formoterol (SYMBICORT) 160-4.5 MCG/ACT inhaler, TAKE 2 PUFFS BY MOUTH TWICE A DAY   Fluticasone-Umeclidin-Vilant (TRELEGY ELLIPTA) 100-62.5-25 MCG/INH AEPB, Inhale 1 puff into the lungs daily.   PROAIR HFA 108  (90 Base) MCG/ACT inhaler, TAKE 2 PUFFS BY MOUTH EVERY 6 HOURS AS NEEDED FOR WHEEZE OR SHORTNESS OF BREATH  Current Outpatient Medications (Analgesics):    acetaminophen (TYLENOL) 500 MG tablet, Take 1,000 mg by mouth every 6 (six) hours as needed for mild pain.    traMADol (ULTRAM) 50 MG tablet, TAKE 1 TABLET BY MOUTH EVERY 6 HOURS AS NEEDED  Current Outpatient Medications (Hematological):    ELIQUIS 5 MG TABS tablet, TAKE 1 TABLET BY MOUTH 2 TIMES DAILY.  Current Outpatient Medications (Other):    ALPRAZolam (XANAX) 0.5 MG tablet, TAKE 1 TABLET BY MOUTH TWICE A DAY AS NEEDED FOR ANXIETY   cholecalciferol (VITAMIN D)  1000 units tablet, Take 1,000 Units by mouth daily.   docusate sodium (COLACE) 100 MG capsule, Take 100 mg by mouth daily as needed for mild constipation.   gabapentin (NEURONTIN) 100 MG capsule, TAKE 1 CAPSULE BY MOUTH THREE TIMES A DAY   Multiple Vitamin (MULTIVITAMIN WITH MINERALS) TABS tablet, Take 1 tablet by mouth daily.    temazepam (RESTORIL) 30 MG capsule, TAKE 1 CAPSULE (30 MG TOTAL) BY MOUTH AT BEDTIME AS NEEDED. FOR SLEEP    Past medical history, social, surgical and family history all reviewed in electronic medical record.  [name] pertanent information unless stated regarding to the chief complaint.   Review of Systems:  [name] headache, visual changes, nausea, vomiting, diarrhea, constipation, dizziness, abdominal pain, skin rash, fevers, chills, night sweats, weight loss, swollen lymph nodes, body aches, joint swelling, chest pain, shortness of breath, mood changes.  Positive muscle aches  Objective  Blood pressure 102/60, pulse 61, height 5\' 6"  (1.676 m), SpO2 98 %.   General: [name] apparent distress alert and oriented x3 mood and affect normal, dressed appropriately.  HEENT: Pupils equal, extraocular movements intact  Respiratory: Patient's speak in full sentences and does not appear short of breath  Cardiovascular: [name] lower extremity edema, non tender, [name]  erythema  Skin: Warm dry intact with [name] signs of infection or rash on extremities or on axial skeleton.  Abdomen: Soft nontender  Neuro: Cranial nerves II through XII are intact, neurovascularly intact in all extremities with 2+ DTRs and 2+ pulses.  Lymph: [name] lymphadenopathy of posterior or anterior cervical chain or axillae bilaterally.  Gait normal with good balance and coordination.  MSK:  Non tender with full range of motion and good stability and symmetric strength and tone of shoulders, elbows, wrist, hip and ankles bilaterally.  Knee: Right valgus deformity noted. Large thigh to calf ratio.  Tender to palpation over medial and PF joint line.  ROM full in flexion and extension and lower leg rotation. instability with valgus force.  painful patellar compression. Patellar glide with moderate crepitus. Patellar and quadriceps tendons unremarkable. Hamstring and quadriceps strength is normal. Contralateral knee unremarkable  After informed written and verbal consent, patient was seated on exam table. Right knee was prepped with alcohol swab and utilizing anterolateral approach, patient's right knee space was injected with 22 mg/mL of Monovisc (sodium hyaluronate) in a prefilled syringe was injected easily into the knee through a 22-gauge needle..Patient tolerated the procedure well without immediate complications.   Impression and Recommendations:     This case required medical decision making of moderate complexity. The above documentation has been reviewed and is accurate and complete Lyndal Pulley, DO       Note: This dictation was prepared with Dragon dictation along with smaller phrase technology. Any transcriptional errors that result from this process are unintentional.

## 2018-10-22 ENCOUNTER — Other Ambulatory Visit: Payer: Self-pay

## 2018-10-22 ENCOUNTER — Other Ambulatory Visit: Payer: Self-pay | Admitting: Internal Medicine

## 2018-10-22 ENCOUNTER — Other Ambulatory Visit: Payer: PPO | Admitting: *Deleted

## 2018-10-22 DIAGNOSIS — I5032 Chronic diastolic (congestive) heart failure: Secondary | ICD-10-CM | POA: Diagnosis not present

## 2018-10-22 LAB — BASIC METABOLIC PANEL
BUN/Creatinine Ratio: 19 (ref 10–24)
BUN: 26 mg/dL (ref 8–27)
CO2: 25 mmol/L (ref 20–29)
Calcium: 9.3 mg/dL (ref 8.6–10.2)
Chloride: 104 mmol/L (ref 96–106)
Creatinine, Ser: 1.38 mg/dL — ABNORMAL HIGH (ref 0.76–1.27)
GFR calc Af Amer: 62 mL/min/{1.73_m2} (ref >59)
GFR calc non Af Amer: 54 mL/min/{1.73_m2} — ABNORMAL LOW (ref >59)
Glucose: 100 mg/dL — ABNORMAL HIGH (ref 65–99)
Potassium: 4.6 mmol/L (ref 3.5–5.2)
Sodium: 143 mmol/L (ref 134–144)

## 2018-10-25 ENCOUNTER — Other Ambulatory Visit: Payer: Self-pay | Admitting: Internal Medicine

## 2018-10-25 MED ORDER — TEMAZEPAM 30 MG PO CAPS: 30.0000 mg | ORAL_CAPSULE | Freq: Every evening | ORAL | 2 refills | Status: DC | PRN

## 2018-10-25 NOTE — Telephone Encounter (Signed)
Done erx 

## 2018-10-28 ENCOUNTER — Telehealth: Payer: Self-pay | Admitting: *Deleted

## 2018-10-28 NOTE — Telephone Encounter (Signed)
-----   Message from Shana M Overton, CMA sent at 10/11/2018  3:22 PM EDT ----- Regarding: PATIENT NEEDS SLEEP STUDY   

## 2018-10-28 NOTE — Addendum Note (Signed)
Addended by: Freada Bergeron on: 10/28/2018 02:54 PM   Modules accepted: Orders

## 2018-10-28 NOTE — Telephone Encounter (Signed)
Staff message sent to Charlott Holler received from [facility] for in lab sleep study. Ok to schedule. Auth # [id number]. Valid DOS 10/28/18 to 01/26/19.

## 2018-11-02 ENCOUNTER — Telehealth: Payer: Self-pay | Admitting: *Deleted

## 2018-11-02 ENCOUNTER — Other Ambulatory Visit: Payer: Self-pay | Admitting: Student

## 2018-11-02 NOTE — Telephone Encounter (Signed)
-----   Message from Lauralee Evener, [place] sent at 10/28/2018 12:26 PM EDT ----- Regarding: RE: [place] from [facility]. Ok to schedule sleep study. Auth # [id number]. Valid dates 10/28/18 to 01/26/19. ----- Message ----- From: Claude Manges, CMA Sent: 10/11/2018   3:22 PM EDT To: Freada Bergeron, CMA, [facility] Sleep Studies Subject: PATIENT NEEDS SLEEP STUDY

## 2018-11-02 NOTE — Telephone Encounter (Addendum)
Patient is scheduled for lab study on 9/20. [place] is scheduled for COVID screening on 9/17 2:40 prior to Sleep Study.  Patient understands his sleep study will be done at [facility] sleep lab. Patient understands he will receive a sleep packet in a week or so. Patient understands to call if he does not receive the sleep packet in a timely manner. Patient agrees with treatment and thanked me for call.

## 2018-11-11 ENCOUNTER — Other Ambulatory Visit (HOSPITAL_COMMUNITY)
Admission: RE | Admit: 2018-11-11 | Discharge: 2018-11-11 | Disposition: A | Discharge status: Home / Self Care | Payer: PPO | Source: Ambulatory Visit | Attending: Cardiology | Admitting: Cardiology

## 2018-11-11 DIAGNOSIS — Z01812 Encounter for preprocedural laboratory examination: Secondary | ICD-10-CM | POA: Insufficient documentation

## 2018-11-11 DIAGNOSIS — Z20828 Contact with and (suspected) exposure to other viral communicable diseases: Secondary | ICD-10-CM | POA: Diagnosis not present

## 2018-11-11 LAB — SARS CORONAVIRUS 2 (TAT 6-24 HRS): SARS Coronavirus 2: NEGATIVE

## 2018-11-14 ENCOUNTER — Other Ambulatory Visit: Payer: Self-pay

## 2018-11-14 ENCOUNTER — Ambulatory Visit (HOSPITAL_BASED_OUTPATIENT_CLINIC_OR_DEPARTMENT_OTHER): Payer: PPO | Attending: Student | Admitting: Cardiology

## 2018-11-14 DIAGNOSIS — R0683 Snoring: Secondary | ICD-10-CM

## 2018-11-14 DIAGNOSIS — Z79899 Other long term (current) drug therapy: Secondary | ICD-10-CM | POA: Diagnosis not present

## 2018-11-14 DIAGNOSIS — G4733 Obstructive sleep apnea (adult) (pediatric): Secondary | ICD-10-CM | POA: Diagnosis not present

## 2018-11-14 DIAGNOSIS — R0902 Hypoxemia: Secondary | ICD-10-CM | POA: Insufficient documentation

## 2018-11-14 NOTE — Progress Notes (Signed)
PCP:  [name], MD Primary Cardiologist: No primary care provider on file. Electrophysiologist: None   [name] is a 63 y.o. male with PMH of PAF, HTN, and remote tachy induced CMP who presents today for routine electrophysiology followup. They are seen for Dr Dr. Lovena Le.  Since last being seen in our clinic, the patient reports doing very well with medication adjustment.   Today, he is doing well overall. He works with [employer]. He walks > 15k steps on Sat, Sun, and Mon with his dog, > 10k on the other days. He denies orthopnea, bendopnea, or PND. No peripheral edema. He has been taking the last daily instead of as needed, and doesn't feel like it has changed his breathing. He remains SOB with moderate exertion. Denies chest pain or tachypalpitations. He had a sleep study last night and is awaiting results. He was told "Oh yeah, you've definitely got sleep apnea."  The patient feels that he is tolerating medications without difficulties and is otherwise without complaint today.   Past Medical History:  Diagnosis Date  . ALLERGIC RHINITIS 01/14/2007  . ANXIETY 09/28/2006  . Asbestos exposure   . Atrial fibrillation ([place]) 09/28/2006   s/p afib ablation x 2  . CHF (congestive heart failure) ([place])   . COPD (chronic obstructive pulmonary disease) ([place])   . Coronary artery disease 11/24/2011  . Diastolic dysfunction 99991111  . Dyspnea    W/ EXERTION   . Dysrhythmia    AFIB     . Gallstones   . HYPERLIPIDEMIA 09/28/2006  . HYPERTENSION 09/28/2006  . HYPOTHYROIDISM, ACQUIRED NEC 09/28/2006   pt denies and doesn't know  . Long term current use of anticoagulant 04/08/2010  . OBSTRUCTIVE SLEEP APNEA 01/06/2008   NOT USING    . Right knee DJD 07/09/2010  . S/P CABG x 1 11/26/2011   Right internal mammary artery to right coronary artery  . S/P Maze operation for atrial fibrillation 11/26/2011   complete biatrial lesion set with clipping of LA appendage   Past Surgical History:   Procedure Laterality Date  . ANTERIOR CERVICAL DECOMP/DISCECTOMY FUSION N/A 06/20/2016   Procedure: Anterior Cervical Discectomy Fusion - Cervical five-Cervical six - Cervical six-Cervical seven - Cervicla seven- Thoracic one;  Surgeon: Earnie Larsson, MD;  Location: [facility];  Service: Neurosurgery;  Laterality: N/A;  . CARDIOVERSION     X4   . CERVICAL FUSION  06/20/2016   C5-6, C6-7, C7-T1 anterior cervical discectomy with interbody fusion utilizing interbody peek cages, locally harvested autograft, and anterior plate instrumentation  . CORONARY ARTERY BYPASS GRAFT  11/26/2011   Procedure: CORONARY ARTERY BYPASS GRAFTING (CABG);  Surgeon: Rexene Alberts, MD;  Location: [facility];  Service: Open Heart Surgery;  Laterality: N/A;  coronary artery bypass on pump times one utilizing the right internal mammary artery, transesophageal echocardiogram   . hand surgury Left   . LEFT HEART CATHETERIZATION WITH CORONARY ANGIOGRAM N/A 11/24/2011   Procedure: LEFT HEART CATHETERIZATION WITH CORONARY ANGIOGRAM;  Surgeon: Peter M Martinique, MD;  Location: [facility] C[facility] LAB;  Service: Cardiovascular;  Laterality: N/A;  . LOOP RECORDER IMPLANT N/A 07/06/2012   Procedure: LOOP RECORDER IMPLANT;  Surgeon: Thompson Grayer, MD;  Location: [facility] CATH LAB;  Service: Cardiovascular;  Laterality: N/A;  . MAZE  11/26/2011   Procedure: MAZE;  Surgeon: Rexene Alberts, MD;  Location: [facility];  Service: Open Heart Surgery;  Laterality: N/A;  Cryomaze   . Radiofrequency Ablation for atrial fibrillation  12/30/07  and 06/05/09   afib ablation x 2 by JA  . Radiofrequency ablation for atrial flutter  2005   CTI    Current Outpatient Medications  Medication Sig Dispense Refill  . acetaminophen (TYLENOL) 500 MG tablet Take 1,000 mg by mouth every 6 (six) hours as needed for mild pain.     Marland Kitchen ALPRAZolam (XANAX) 0.5 MG tablet TAKE 1 TABLET BY MOUTH TWICE A DAY AS NEEDED FOR ANXIETY 180 tablet 1  . budesonide-formoterol (SYMBICORT) 160-4.5 MCG/ACT inhaler TAKE  2 PUFFS BY MOUTH TWICE A DAY 30.6 Inhaler 4  . cholecalciferol (VITAMIN D) 1000 units tablet Take 1,000 Units by mouth daily.    Marland Kitchen docusate sodium (COLACE) 100 MG capsule Take 100 mg by mouth daily as needed for mild constipation.    Marland Kitchen ELIQUIS 5 MG TABS tablet TAKE 1 TABLET BY MOUTH TWICE A DAY 180 tablet 1  . Fluticasone-Umeclidin-Vilant (TRELEGY ELLIPTA) 100-62.5-25 MCG/INH AEPB Inhale 1 puff into the lungs daily. 1 each 11  . furosemide (LASIX) 20 MG tablet Take 1 tablet (20 mg total) by mouth as needed (SWELLING). 30 tablet 1  . gabapentin (NEURONTIN) 100 MG capsule TAKE 1 CAPSULE BY MOUTH THREE TIMES A DAY 270 capsule 2  . lovastatin (MEVACOR) 40 MG tablet TAKE 2 TABLETS (80 MG TOTAL) BY MOUTH EVERY EVENING. 180 tablet 2  . metoprolol succinate (TOPROL-XL) 50 MG 24 hr tablet TAKE ONE AND ONE HALF TABLETS BY MOUTH DAILY 135 tablet 1  . Multiple Vitamin (MULTIVITAMIN WITH MINERALS) TABS tablet Take 1 tablet by mouth daily.     . Olmesartan-amLODIPine-HCTZ 40-5-12.5 MG TABS TAKE 1 TABLET BY MOUTH EVERY DAY 90 tablet 1  . omega-3 acid ethyl esters (LOVAZA) 1 g capsule Take 1 g by mouth daily.    Marland Kitchen PROAIR HFA 108 (90 Base) MCG/ACT inhaler TAKE 2 PUFFS BY MOUTH EVERY 6 HOURS AS NEEDED FOR WHEEZE OR SHORTNESS OF BREATH 8.5 Inhaler 11  . spironolactone (ALDACTONE) 25 MG tablet Take 0.5 tablets (12.5 mg total) by mouth at bedtime. 45 tablet 3  . temazepam (RESTORIL) 30 MG capsule Take 1 capsule (30 mg total) by mouth at bedtime as needed. for sleep 90 capsule 2  . traMADol (ULTRAM) 50 MG tablet TAKE 1 TABLET BY MOUTH EVERY 6 HOURS AS NEEDED 120 tablet 2   No current facility-administered medications for this visit.     Allergies  Allergen Reactions  . Zolpidem Tartrate Other (See Comments)    Extremely drowsy the next day    Social History   Socioeconomic History  . Marital status: Single    Spouse name: Not on file  . Number of children: 0  . Years of education: Not on file  . Highest  education level: Not on file  Occupational History  . Occupation: works at the [employer]  . Financial resource strain: Not hard at all  . Food insecurity    Worry: Never true    Inability: Never true  . Transportation needs    Medical: No    Non-medical: No  Tobacco Use  . Smoking status: Former Smoker    Packs/day: 1.50    Years: 26.00    Pack years: 39.00    Types: Cigarettes    Quit date: 04/06/1995    Years since quitting: 23.6  . Smokeless tobacco: Never Used  Substance and Sexual Activity  . Alcohol use: No    Alcohol/week: 0.0 standard drinks  . Drug use: No  .  Sexual activity: Yes  Lifestyle  . Physical activity    Days per week: 5 days    Minutes per session: 80 min  . Stress: Not at all  Relationships  . Social connections    Talks on phone: More than three times a week    Gets together: More than three times a week    Attends religious service: Never    Active member of club or organization: Yes    Attends meetings of clubs or organizations: More than 4 times per year    Relationship status: Not on file  . Intimate partner violence    Fear of current or ex partner: Not on file    Emotionally abused: Not on file    Physically abused: Not on file    Forced sexual activity: Not on file  Other Topics Concern  . Not on file  Social History Narrative   Pt lives in [place], [place] with his friend.      Review of Systems: General: No chills, fever, night sweats or weight changes  Cardiovascular:  No chest pain, dyspnea on exertion, edema, orthopnea, palpitations, paroxysmal nocturnal dyspnea Dermatological: No rash, lesions or masses Respiratory: No cough, dyspnea Urologic: No hematuria, dysuria Abdominal: No nausea, vomiting, diarrhea, bright red blood per rectum, melena, or hematemesis Neurologic: No visual changes, weakness, changes in mental status All other systems reviewed and are otherwise negative except as noted above.   Physical Exam: Vitals:   11/15/18 0803  BP: 118/68  Pulse: 68  Weight: 195 lb (88.5 kg)  Height: 5\' 6"  (1.676 m)   Wt Readings from Last 3 Encounters:  11/15/18 195 lb (88.5 kg)  11/14/18 191 lb (86.6 kg)  10/11/18 193 lb (87.5 kg)    GEN- The patient is well appearing, alert and oriented x 3 today.   HEENT: normocephalic, atraumatic; sclera clear, conjunctiva pink; hearing intact; oropharynx clear; neck supple, no JVP Lymph- no cervical lymphadenopathy Lungs- Clear to ausculation bilaterally, normal work of breathing.  No wheezes, rales, rhonchi Heart- Regular rate and rhythm, no murmurs, rubs or gallops, PMI not laterally displaced GI- soft, non-tender, non-distended, bowel sounds present, no hepatosplenomegaly Extremities- no clubbing, cyanosis, or edema; DP/PT/radial pulses 2+ bilaterally MS- no significant deformity or atrophy Skin- warm and dry, no rash or lesion Psych- euthymic mood, full affect Neuro- strength and sensation are intact  EKG is not ordered today. Personal review of EKG from 10/11/2018 shows NSR 78 bpm with 1st degree AV block  Assessment and Plan:  1. Chronic systolic CHF EF [id number] on 08/2018 Continue toprol XL Continue Olmesartan Continue spiro 12.5 mg daily. BMET today. Recommended he take lasix 20 mg prn only.  2. PAF Regular on exam Continue eliquis  3. HTN Controlled on current regimen  4. OSA Went for repeat sleep study last night. He was told he was very positive. Awaiting formal results and CPAP titration.  Shirley Friar, PA-C  11/15/18 8:07 AM

## 2018-11-15 ENCOUNTER — Ambulatory Visit (INDEPENDENT_AMBULATORY_CARE_PROVIDER_SITE_OTHER): Payer: PPO | Admitting: Student

## 2018-11-15 VITALS — BP 118/68 | HR 68 | Ht 66.0 in | Wt 195.0 lb

## 2018-11-15 DIAGNOSIS — I5032 Chronic diastolic (congestive) heart failure: Secondary | ICD-10-CM | POA: Diagnosis not present

## 2018-11-15 LAB — BASIC METABOLIC PANEL
BUN/Creatinine Ratio: 19 (ref 10–24)
BUN: 29 mg/dL — ABNORMAL HIGH (ref 8–27)
CO2: 26 mmol/L (ref 20–29)
Calcium: 9.8 mg/dL (ref 8.6–10.2)
Chloride: 102 mmol/L (ref 96–106)
Creatinine, Ser: 1.54 mg/dL — ABNORMAL HIGH (ref 0.76–1.27)
GFR calc Af Amer: 55 mL/min/{1.73_m2} — ABNORMAL LOW (ref >59)
GFR calc non Af Amer: 47 mL/min/{1.73_m2} — ABNORMAL LOW (ref >59)
Glucose: 102 mg/dL — ABNORMAL HIGH (ref 65–99)
Potassium: 4.2 mmol/L (ref 3.5–5.2)
Sodium: 142 mmol/L (ref 134–144)

## 2018-11-15 NOTE — Patient Instructions (Signed)
Medication Instructions:   Your physician recommends that you continue on your current medications as directed. Please refer to the Current Medication list given to you today.  If you need a refill on your cardiac medications before your next appointment, please call your pharmacy.   Lab work:  BMET    If you have labs (blood work) drawn today and your tests are completely normal, you will receive your results only by: Marland Kitchen MyChart Message (if you have MyChart) OR . A paper copy in the mail If you have any lab test that is abnormal or we need to change your treatment, we will call you to review the results.  Testing/Procedures:NONE ORDERED  TODAY   Follow-Up: At [facility], you and your health needs are our priority.  As part of our continuing mission to provide you with exceptional heart care, we have created designated Provider Care Teams.  These Care Teams include your primary Cardiologist (physician) and Advanced Practice Providers (APPs -  Physician Assistants and Nurse Practitioners) who all work together to provide you with the care you need, when you need it. You will need a follow up appointment in 6 months.  Please call our office 2 months in advance to schedule this appointment.  You may see Dr. Lovena Le  or one of the following Advanced Practice Providers on your designated Care Team:   Chanetta Marshall, NP . Tommye Standard, PA-C . Joesph July PA-C   Any Other Special Instructions Will Be Listed Below (If Applicable).

## 2018-11-17 ENCOUNTER — Other Ambulatory Visit: Payer: Self-pay | Admitting: *Deleted

## 2018-11-17 DIAGNOSIS — I5032 Chronic diastolic (congestive) heart failure: Secondary | ICD-10-CM

## 2018-11-17 MED ORDER — FUROSEMIDE 20 MG PO TABS: 20.0000 mg | ORAL_TABLET | Freq: Every day | ORAL | 3 refills | Status: DC

## 2018-11-17 NOTE — Addendum Note (Signed)
Addended by: Claude Manges on: 11/17/2018 03:53 PM   Modules accepted: Orders

## 2018-11-18 ENCOUNTER — Other Ambulatory Visit: Payer: Self-pay | Admitting: Student

## 2018-11-18 DIAGNOSIS — I5032 Chronic diastolic (congestive) heart failure: Secondary | ICD-10-CM

## 2018-11-18 MED ORDER — FUROSEMIDE 20 MG PO TABS: 20.0000 mg | ORAL_TABLET | ORAL | 3 refills | Status: DC | PRN

## 2018-11-18 NOTE — Progress Notes (Signed)
Discussed with patient. As originally discussed, plan was to decrease lasix to as needed only.   He had not picked up daily prescription, and will.   Pt aware to call the office if he needs lasix more than twice weekly.   Shirley Friar, PA-C  11/18/2018 9:28 AM

## 2018-11-28 NOTE — Progress Notes (Signed)
[name] [address] [place], [place] [address] Phone: [phone] Subjective:   [name], am serving as a scribe for Dr. Hulan [name].  I'  CC: Right knee pain  [id number]  [name] is a 63 y.o. male coming in with complaint of right knee pain. Pain is worse than last visit. Has been falling due to pain. Pain over the medial aspect of joint. Does walk on asphalt at his work and at home. Uses ice for pain. Is having a hard time sleeping. Is using Tramadol 3x  daily.      Past Medical History:  Diagnosis Date  . ALLERGIC RHINITIS 01/14/2007  . ANXIETY 09/28/2006  . Asbestos exposure   . Atrial fibrillation ([place]) 09/28/2006   s/p afib ablation x 2  . CHF (congestive heart failure) ([place])   . COPD (chronic obstructive pulmonary disease) ([place])   . Coronary artery disease 11/24/2011  . Diastolic dysfunction 99991111  . Dyspnea    W/ EXERTION   . Dysrhythmia    AFIB     . Gallstones   . HYPERLIPIDEMIA 09/28/2006  . HYPERTENSION 09/28/2006  . HYPOTHYROIDISM, ACQUIRED NEC 09/28/2006   pt denies and doesn't know  . Long term current use of anticoagulant 04/08/2010  . OBSTRUCTIVE SLEEP APNEA 01/06/2008   NOT USING    . Right knee DJD 07/09/2010  . S/P CABG x 1 11/26/2011   Right internal mammary artery to right coronary artery  . S/P Maze operation for atrial fibrillation 11/26/2011   complete biatrial lesion set with clipping of LA appendage   Past Surgical History:  Procedure Laterality Date  . ANTERIOR CERVICAL DECOMP/DISCECTOMY FUSION N/A 06/20/2016   Procedure: Anterior Cervical Discectomy Fusion - Cervical five-Cervical six - Cervical six-Cervical seven - Cervicla seven- Thoracic one;  Surgeon: [place] Larsson, MD;  Location: [facility];  Service: Neurosurgery;  Laterality: N/A;  . CARDIOVERSION     X4   . CERVICAL FUSION  06/20/2016   C5-6, C6-7, C7-T1 anterior cervical discectomy with interbody fusion utilizing interbody peek cages, locally  harvested autograft, and anterior plate instrumentation  . CORONARY ARTERY BYPASS GRAFT  11/26/2011   Procedure: CORONARY ARTERY BYPASS GRAFTING (CABG);  Surgeon: [place] Alberts, MD;  Location: [facility];  Service: Open Heart Surgery;  Laterality: N/A;  coronary artery bypass on pump times one utilizing the right internal mammary artery, transesophageal echocardiogram   . hand surgury Left   . LEFT HEART CATHETERIZATION WITH CORONARY ANGIOGRAM N/A 11/24/2011   Procedure: LEFT HEART CATHETERIZATION WITH CORONARY ANGIOGRAM;  Surgeon: Scott M Martinique, MD;  Location: [facility] CATH LAB;  Service: Cardiovascular;  Laterality: N/A;  . LOOP RECORDER IMPLANT N/A 07/06/2012   Procedure: LOOP RECORDER IMPLANT;  Surgeon: [place] Grayer, MD;  Location[facility] CATH LAB;  Service: Cardiovascular;  Laterality: N/A;  . MAZE  11/26/2011   Procedure: MAZE;  Surgeon: [place] Alberts, MD;  Location: [facility];  Service: Open Heart Surgery;  Laterality: N/A;  Cryomaze   . Radiofrequency Ablation for atrial fibrillation  12/30/07 and 06/05/09   afib ablation x 2 by [name]  . Radiofrequency ablation for atrial flutter  2005   CTI   Social History   Socioeconomic History  . Marital status: Single    Spouse name: Not on file  . Number of children: 0  . Years of education: Not on file  . Highest education level: Not on file  Occupational History  . Occupation: works at  the [employer] part-time  Social Needs  . Financial resource strain: Not hard at all  . Food insecurity    Worry: Never true    Inability: Never true  . Transportation needs    Medical: [name]    Non-medical: [name]  Tobacco Use  . Smoking status: Former Smoker    Packs/day: 1.50    Years: 26.00    Pack years: 39.00    Types: Cigarettes    Quit date: 04/06/1995    Years since quitting: 23.6  . Smokeless tobacco: Never Used  Substance and Sexual Activity  . Alcohol use: [name]    Alcohol/week: 0.0 standard drinks  . Drug use: [name]  . Sexual activity: Yes  Lifestyle  . Physical  activity    Days per week: 5 days    Minutes per session: 80 min  . Stress: Not at all  Relationships  . Social connections    Talks on phone: More than three times a week    Gets together: More than three times a week    Attends religious service: Never    Active member of club or organization: Yes    Attends meetings of clubs or organizations: More than 4 times per year    Relationship status: Not on file  Other Topics Concern  . Not on file  Social History Narrative   Pt lives in [name], [place] with his friend.    Allergies  Allergen Reactions  . Zolpidem Tartrate Other (See Comments)    Extremely drowsy the next day   Family History  Problem Relation Age of Onset  . Dementia Father   . Hypertension Father   . Atrial fibrillation Mother   . Stroke Mother   . Dementia Paternal Grandfather   . Cancer Paternal Grandmother        type unknown     Current Outpatient Medications (Cardiovascular):  .  furosemide (LASIX) 20 MG tablet, Take 1 tablet (20 mg total) by mouth as needed. Call the office if needed > twice weekly. Marland Kitchen  lovastatin (MEVACOR) 40 MG tablet, TAKE 2 TABLETS (80 MG TOTAL) BY MOUTH EVERY EVENING. .  metoprolol succinate (TOPROL-XL) 50 MG 24 hr tablet, TAKE ONE AND ONE HALF TABLETS BY MOUTH DAILY .  Olmesartan-amLODIPine-HCTZ 40-5-12.5 MG TABS, TAKE 1 TABLET BY MOUTH EVERY DAY .  omega-3 acid ethyl esters (LOVAZA) 1 g capsule, Take 1 g by mouth daily. Marland Kitchen  spironolactone (ALDACTONE) 25 MG tablet, Take 0.5 tablets (12.5 mg total) by mouth at bedtime.  Current Outpatient Medications (Respiratory):  .  budesonide-formoterol (SYMBICORT) 160-4.5 MCG/ACT inhaler, TAKE 2 PUFFS BY MOUTH TWICE A DAY .  Fluticasone-Umeclidin-Vilant (TRELEGY ELLIPTA) 100-62.5-25 MCG/INH AEPB, Inhale 1 puff into the lungs daily. Marland Kitchen  PROAIR HFA 108 (90 Base) MCG/ACT inhaler, TAKE 2 PUFFS BY MOUTH EVERY 6 HOURS AS NEEDED FOR WHEEZE OR SHORTNESS OF BREATH  Current Outpatient Medications  (Analgesics):  .  acetaminophen (TYLENOL) 500 MG tablet, Take 1,000 mg by mouth every 6 (six) hours as needed for mild pain.  .  traMADol (ULTRAM) 50 MG tablet, TAKE 1 TABLET BY MOUTH EVERY 6 HOURS AS NEEDED  Current Outpatient Medications (Hematological):  Marland Kitchen  ELIQUIS 5 MG TABS tablet, TAKE 1 TABLET BY MOUTH TWICE A DAY  Current Outpatient Medications (Other):  Marland Kitchen  ALPRAZolam (XANAX) 0.5 MG tablet, TAKE 1 TABLET BY MOUTH TWICE A DAY AS NEEDED FOR ANXIETY .  cholecalciferol (VITAMIN D) 1000 units tablet, Take 1,000 Units by mouth daily. Marland Kitchen  docusate sodium (COLACE) 100 MG capsule, Take 100 mg by mouth daily as needed for mild constipation. .  gabapentin (NEURONTIN) 100 MG capsule, TAKE 1 CAPSULE BY MOUTH THREE TIMES A DAY .  Multiple Vitamin (MULTIVITAMIN WITH MINERALS) TABS tablet, Take 1 tablet by mouth daily.  .  temazepam (RESTORIL) 30 MG capsule, Take 1 capsule (30 mg total) by mouth at bedtime as needed. for sleep    Past medical history, social, surgical and family history all reviewed in electronic medical record.  [name] pertanent information unless stated regarding to the chief complaint.   Review of Systems:  [name] headache, visual changes, nausea, vomiting, diarrhea, constipation, dizziness, abdominal pain, skin rash, fevers, chills, night sweats, weight loss, swollen lymph nodes, body aches, joint swelling, muscle aches, chest pain, shortness of breath, mood changes.   Objective  Blood pressure (!) 102/56, pulse 91, height 5\' 6"  (1.676 m), weight 195 lb (88.5 kg), SpO2 98 %.    General: [name] apparent distress alert and oriented x3 mood and affect normal, dressed appropriately.  HEENT: Pupils equal, extraocular movements intact  Respiratory: Patient's speak in full sentences and does not appear short of breath  Cardiovascular: [name] lower extremity edema, non tender, [name] erythema  Skin: Warm dry intact with [name] signs of infection or rash on extremities or on axial skeleton.  Abdomen: Soft  nontender  Neuro: Cranial nerves II through XII are intact, neurovascularly intact in all extremities with 2+ DTRs and 2+ pulses.  Lymph: [name] lymphadenopathy of posterior or anterior cervical chain or axillae bilaterally.  Gait antalgic MSK:  tender with full range of motion and good stability and symmetric strength and tone of shoulders, elbows, wrist, hip, and ankles bilaterally.  Knee: Right valgus deformity noted.  Abnormal thigh to calf ratio.  Tender to palpation over medial and PF joint line.  ROM full in flexion and extension and lower leg rotation. instability with valgus force.  painful patellar compression. Patellar glide with moderate crepitus. Patellar and quadriceps tendons unremarkable. Hamstring and quadriceps strength is normal. Contralateral knee shows mild arthritic changes  After informed written and verbal consent, patient was seated on exam table. Right knee was prepped with alcohol swab and utilizing anterolateral approach, patient's right knee space was injected with 22 mg/mL of Monovisc (sodium hyaluronate) in a prefilled syringe was injected easily into the knee through a 22-gauge needle..Patient tolerated the procedure well without immediate complications.   Impression and Recommendations:     This case required medical decision making of moderate complexity. The above documentation has been reviewed and is accurate and complete Lyndal Pulley, DO       Note: This dictation was prepared with Dragon dictation along with smaller phrase technology. Any transcriptional errors that result from this process are unintentional.

## 2018-11-29 ENCOUNTER — Telehealth: Payer: Self-pay | Admitting: Student

## 2018-11-29 ENCOUNTER — Ambulatory Visit (INDEPENDENT_AMBULATORY_CARE_PROVIDER_SITE_OTHER): Payer: PPO | Admitting: Family Medicine

## 2018-11-29 ENCOUNTER — Telehealth: Payer: Self-pay

## 2018-11-29 ENCOUNTER — Other Ambulatory Visit: Payer: PPO | Admitting: *Deleted

## 2018-11-29 ENCOUNTER — Other Ambulatory Visit: Payer: Self-pay

## 2018-11-29 ENCOUNTER — Telehealth: Payer: Self-pay | Admitting: Cardiology

## 2018-11-29 ENCOUNTER — Encounter: Payer: Self-pay | Admitting: Family Medicine

## 2018-11-29 DIAGNOSIS — I5032 Chronic diastolic (congestive) heart failure: Secondary | ICD-10-CM

## 2018-11-29 DIAGNOSIS — M1711 Unilateral primary osteoarthritis, right knee: Secondary | ICD-10-CM | POA: Diagnosis not present

## 2018-11-29 LAB — BASIC METABOLIC PANEL
BUN/Creatinine Ratio: 17 (ref 10–24)
BUN: 24 mg/dL (ref 8–27)
CO2: 25 mmol/L (ref 20–29)
Calcium: 9.4 mg/dL (ref 8.6–10.2)
Chloride: 104 mmol/L (ref 96–106)
Creatinine, Ser: 1.45 mg/dL — ABNORMAL HIGH (ref 0.76–1.27)
GFR calc Af Amer: 59 mL/min/{1.73_m2} — ABNORMAL LOW (ref >59)
GFR calc non Af Amer: 51 mL/min/{1.73_m2} — ABNORMAL LOW (ref >59)
Glucose: 101 mg/dL — ABNORMAL HIGH (ref 65–99)
Potassium: 4.1 mmol/L (ref 3.5–5.2)
Sodium: 142 mmol/L (ref 134–144)

## 2018-11-29 NOTE — Telephone Encounter (Signed)
Copied from [place] [id number]. Topic: General - Other >> Nov 29, 2018  9:36 AM Burchel, Abbi R wrote: Reason for CRM: Pt calling to get results of sleep study. Please call pt this afternoon (he has appointments this morning).  [phone]

## 2018-11-29 NOTE — Telephone Encounter (Signed)
Please ask pt to contact the ordering physician, as this is out of the scope of my practice

## 2018-11-29 NOTE — Telephone Encounter (Signed)
Pt has been informed to follow up with cardiology since that physician ordered the test.

## 2018-11-29 NOTE — Telephone Encounter (Signed)
Please call with sleep study results.

## 2018-11-29 NOTE — Telephone Encounter (Signed)
There was some confusion today with patient regarding his blood work and visit. He said that blood was taken on his last visit and he thought that today was a visit with Tiller/Taylor. I called triage and talked to Nurse Anderson Malta and she told me that the blood work was needed for today and to send a message to shana regarding if he needs another visit in 6 weeks or 6 months.

## 2018-11-29 NOTE — Assessment & Plan Note (Signed)
Increasing discomfort, patient wants to hold on any type of surgical intervention.  Discussed with patient that icing regimen, home exercises, patient hopefully will respond well to the viscosupplementation.  Due to the abnormal thigh to calf ratio patient will be fitted for custom OA brace secondary to the instability.  Follow-up again in 4 to 6 weeks to see how patient is responding.

## 2018-11-29 NOTE — Patient Instructions (Signed)
Read about PRP Injected right knee with visco today Thurmond Butts will call you about brace See me again in 6 weeks

## 2018-11-30 NOTE — Telephone Encounter (Signed)
New Message     Patient calling in for stress test results.

## 2018-11-30 NOTE — Telephone Encounter (Signed)
Pt calling to ask for his sleep study results from 11/14/18.

## 2018-12-02 NOTE — Telephone Encounter (Signed)
Returned call to patient and explained to him that Dr [name] has not resulted his study yet but I will call him as soon as I get his results.

## 2018-12-05 NOTE — Procedures (Signed)
Patient Name: Scott [name], Scott [name] Date: 11/14/2018 Gender: Male D.O.B: 1955-06-23 Age (years): 63 Referring Provider: Barrington Ellison PA-C Height (inches): 28 Interpreting Physician: Fransico Him MD, ABSM Weight (lbs): 191 RPSGT: Earney Hamburg BMI: 28 MRN: [id number] Neck Size: 17.50  CLINICAL INFORMATION Sleep Study Type: Split Night CPAP  Indication for sleep study: OSA, Snoring  Epworth Sleepiness Score: 13  SLEEP STUDY TECHNIQUE As per the AASM Manual for the Scoring of Sleep and Associated Events v2.3 (April 2016) with a hypopnea requiring 4% desaturations.  The channels recorded and monitored were frontal, central and occipital EEG, electrooculogram (EOG), submentalis EMG (chin), nasal and oral airflow, thoracic and abdominal wall motion, anterior tibialis EMG, snore microphone, electrocardiogram, and pulse oximetry. Continuous positive airway pressure (CPAP) was initiated when the patient met split night criteria and was titrated according to treat sleep-disordered breathing.  MEDICATIONS Medications self-administered by patient taken the night of the study : ALPRAZOLAM, DOCUSATE SODIUM, FUROSEMIDE, GABAPENTIN, LOVASTATIN, SPIRONOLACTONE, TEMAZEPAM, TRAMADOL  RESPIRATORY PARAMETERS Diagnostic Total AHI (/hr): 74.7  RDI (/hr):74.7  OA Index (/hr): 65.8  CA Index (/hr): 0.0 REM AHI (/hr): N/A  NREM AHI (/hr):74.7  Supine AHI (/hr):74.7  Non-supine AHI (/hr): 0 Min O2 Sat (%):83.0  Mean O2 (%): 92.5  Time below 88% (min):13.1   Titration Optimal Pressure (cm):9  AHI at Optimal Pressure (/hr):0.8  Min O2 at Optimal Pressure (%):94.0 Supine % at Optimal (%):43  Sleep % at Optimal (%):95   SLEEP ARCHITECTURE The recording time for the entire night was 406.2 minutes.  During a baseline period of 202.4 minutes, the patient slept for 128.6 minutes in REM and nonREM, yielding a sleep efficiency of 63.5%. Sleep onset after lights out was 69.3 minutes with a  REM latency of N/A minutes. The patient spent 0.8% of the night in stage N1 sleep, 99.2% in stage N2 sleep, 0.0% in stage N3 and 0% in REM.  During the titration period of 190.3 minutes, the patient slept for 149.5 minutes in REM and nonREM, yielding a sleep efficiency of 78.6%. Sleep onset after CPAP initiation was 34.4 minutes with a REM latency of 18.0 minutes. The patient spent 3.3% of the night in stage N1 sleep, 62.5% in stage N2 sleep, 0.0% in stage N3 and 34.1% in REM.  CARDIAC DATA The 2 lead EKG demonstrated sinus rhythm. The mean heart rate was 100.0 beats per minute. Other EKG findings include: None.  LEG MOVEMENT DATA The total Periodic Limb Movements of Sleep (PLMS) were 0. The PLMS index was 0.0 .  IMPRESSIONS - Severe obstructive sleep apnea occurred during the diagnostic portion of the study (AHI = 74.7/hour). An optimal PAP pressure was selected for this patient ( 9 cm of water) - No significant central sleep apnea occurred during the diagnostic portion of the study (CAI = 0.0/hour). - The patient had minimal or no oxygen desaturation during the diagnostic portion of the study (Min O2 = 83.0%) - The patient snored with loud snoring volume during the diagnostic portion of the study. - No cardiac abnormalities were noted during this study. - Clinically significant periodic limb movements did not occur during sleep.  DIAGNOSIS - Obstructive Sleep Apnea (327.23 [G47.33 ICD-10]) - Nocturnal Hypoxemia  RECOMMENDATIONS - Trial of CPAP therapy on 9 cm H2O with a Medium size Fisher&Paykel Full Face Mask Simplus mask and heated humidification. - Avoid alcohol, sedatives and other CNS depressants that may worsen sleep apnea and disrupt normal sleep architecture. - Sleep hygiene should be reviewed to  assess factors that may improve sleep quality. - Weight management and regular exercise should be initiated or continued. - Return to [facility] for re-evaluation after 10 weeks of  therapy  [Electronically signed] 12/05/2018 06:00 PM  Fransico Him MD, ABSM Diplomate, American Board of Sleep Medicine

## 2018-12-07 ENCOUNTER — Telehealth: Payer: Self-pay | Admitting: *Deleted

## 2018-12-07 NOTE — Telephone Encounter (Signed)
-----   Message from Sueanne Margarita, MD sent at 12/05/2018  6:03 PM EDT ----- Please let patient know that they have significant sleep apnea and had successful PAP titration and will be set up with PAP unit.  Please let DME know that order is in EPIC.  Please set patient up for OV in 10 weeks

## 2018-12-07 NOTE — Telephone Encounter (Signed)
Informed patient of sleep study results and patient understanding was verbalized. Patient understands his sleep study showed they have significant sleep apnea and had successful PAP titration and will be set up with PAP unit. Please let DME know that order is in EPIC. Please set patient up for OV in 10 weeks.  Upon patient request DME selection is [facility]. Patient understands his will be contacted by [facility] to set up his cpap. Patient understands to call if [facility] does not contact him with new setup in a timely manner. Patient understands they will be called once confirmation has been received from [facility] that they have received their new machine to schedule 10 week follow up appointment.  [facility] notified of new cpap order  Please add to airview Patient was grateful for the call and thanked me.

## 2018-12-17 ENCOUNTER — Other Ambulatory Visit: Payer: Self-pay | Admitting: Internal Medicine

## 2018-12-24 DIAGNOSIS — M1711 Unilateral primary osteoarthritis, right knee: Secondary | ICD-10-CM | POA: Diagnosis not present

## 2018-12-27 DIAGNOSIS — G4733 Obstructive sleep apnea (adult) (pediatric): Secondary | ICD-10-CM | POA: Diagnosis not present

## 2018-12-30 ENCOUNTER — Other Ambulatory Visit: Payer: Self-pay | Admitting: Internal Medicine

## 2018-12-31 NOTE — Telephone Encounter (Signed)
Done erx 

## 2019-01-05 ENCOUNTER — Telehealth: Payer: Self-pay | Admitting: *Deleted

## 2019-01-05 NOTE — Telephone Encounter (Signed)
Patient has a 10 week follow up appointment scheduled for 03/07/18 3:40. Patient understands he needs to keep this appointment for insurance compliance. Patient was grateful for the call and thanked me.

## 2019-01-09 ENCOUNTER — Other Ambulatory Visit: Payer: Self-pay | Admitting: Internal Medicine

## 2019-01-09 NOTE — Progress Notes (Signed)
[name] [address], [place] [address] Phone: [phone] Subjective:   I Scott [name] am serving as a Education administrator for Dr. Hulan Saas.   CC: Right knee pain  [id number]   11/29/2018 Increasing discomfort, patient wants to hold on any type of surgical intervention.  Discussed with patient that icing regimen, home exercises, patient hopefully will respond well to the viscosupplementation.  Due to the abnormal thigh to calf ratio patient will be fitted for custom OA brace secondary to the instability.  Follow-up again in 4 to 6 weeks to see how patient is responding.  01/10/2019 Scott [name] is a 63 y.o. male coming in with complaint of right knee pain. Patient states the knee is doing terrible. Raking leaves this past weekend when he heard a pop on the lateral side. Does not like the custom knee brace. Pain increased after he felt the pop. States he walked his dogs after the injury.  Patient has increased unfortunately swelling recently.  Continues to have the instability of the knee.  Not wearing the custom brace.      Past Medical History:  Diagnosis Date   ALLERGIC RHINITIS 01/14/2007   ANXIETY 09/28/2006   Asbestos exposure    Atrial fibrillation ([place]) 09/28/2006   s/p afib ablation x 2   CHF (congestive heart failure) ([facility])    COPD (chronic obstructive pulmonary disease) ([facility])    Coronary artery disease [id number]   Diastolic dysfunction [id number]   Dyspnea    W/ EXERTION    Dysrhythmia    AFIB      Gallstones    HYPERLIPIDEMIA 09/28/2006   HYPERTENSION 09/28/2006   HYPOTHYROIDISM, ACQUIRED NEC 09/28/2006   pt denies and doesn't know   Long term current use of anticoagulant 04/08/2010   OBSTRUCTIVE SLEEP APNEA 01/06/2008   NOT USING     Right knee DJD 07/09/2010   S/P CABG x 1 11/26/2011   Right internal mammary artery to right coronary artery   S/P Maze operation for atrial fibrillation 11/26/2011   complete  biatrial lesion set with clipping of LA appendage   Past Surgical History:  Procedure Laterality Date   ANTERIOR CERVICAL DECOMP/DISCECTOMY FUSION N/A 06/20/2016   Procedure: Anterior Cervical Discectomy Fusion - Cervical five-Cervical six - Cervical six-Cervical seven - Cervicla seven- Thoracic one;  Surgeon: Earnie Larsson, MD;  Location: [facility];  Service: Neurosurgery;  Laterality: N/A;   CARDIOVERSION     X4    CERVICAL FUSION  06/20/2016   C5-6, C6-7, C7-T1 anterior cervical discectomy with interbody fusion utilizing interbody peek cages, locally harvested autograft, and anterior plate instrumentation   CORONARY ARTERY BYPASS GRAFT  11/26/2011   Procedure: CORONARY ARTERY BYPASS GRAFTING (CABG);  Surgeon: Rexene Alberts, MD;  Location: [facility];  Service: Open Heart Surgery;  Laterality: N/A;  coronary artery bypass on pump times one utilizing the right internal mammary artery, transesophageal echocardiogram    hand surgury Left    LEFT HEART CATHETERIZATION WITH CORONARY ANGIOGRAM N/A 11/24/2011   Procedure: LEFT HEART CATHETERIZATION WITH CORONARY ANGIOGRAM;  Surgeon: Peter M Martinique, MD;  Location: [facility] CATH LAB;  Service: Cardiovascular;  Laterality: N/A;   LOOP RECORDER IMPLANT N/A 07/06/2012   Procedure: LOOP RECORDER IMPLANT;  Surgeon: Thompson Grayer, MD;  Location: [facility] CATH LAB;  Service: Cardiovascular;  Laterality: N/A;   MAZE  11/26/2011   Procedure: MAZE;  Surgeon: Rexene Alberts, MD;  Location: [facility];  Service: Open Heart Surgery;  Laterality: N/A;  [place]omaze    Radiofrequency Ablation for atrial fibrillation  12/30/07 and 06/05/09   afib ablation x 2 by JA   Radiofrequency ablation for atrial flutter  2005   [facility]   Social History   Socioeconomic History   Marital status: Single    Spouse name: Not on file   Number of children: 0   Years of education: Not on file   Highest education level: Not on file  Occupational History   Occupation: works at the [employer] part-time    Social Needs   Financial resource strain: Not hard at all   Food insecurity    Worry: Never true    Inability: Never true   Transportation needs    Medical: No    Non-medical: No  Tobacco Use   Smoking status: Former Smoker    Packs/day: 1.50    Years: 26.00    Pack years: 39.00    Types: Cigarettes    Quit date: 04/06/1995    Years since quitting: 23.7   Smokeless tobacco: Never Used  Substance and Sexual Activity   Alcohol use: No    Alcohol/week: 0.0 standard drinks   Drug use: No   Sexual activity: Yes  Lifestyle   Physical activity    Days per week: 5 days    Minutes per session: 80 min   Stress: Not at all  Relationships   Social connections    Talks on phone: More than three times a week    Gets together: More than three times a week    Attends religious service: Never    Active member of club or organization: Yes    Attends meetings of clubs or organizations: More than 4 times per year    Relationship status: Not on file  Other Topics Concern   Not on file  Social History Narrative   Pt lives in [place], [place] with his friend.    Allergies  Allergen Reactions   Zolpidem Tartrate Other (See Comments)    Extremely drowsy the next day   Family History  Problem Relation Age of Onset   Dementia Father    Hypertension Father    Atrial fibrillation Mother    Stroke Mother    Dementia Paternal Grandfather    Cancer Paternal Grandmother        type unknown     Current Outpatient Medications (Cardiovascular):    furosemide (LASIX) 20 MG tablet, Take 1 tablet (20 mg total) by mouth as needed. Call the office if needed > twice weekly.   lovastatin (MEVACOR) 40 MG tablet, TAKE 2 TABLETS (80 MG TOTAL) BY MOUTH EVERY EVENING.   metoprolol succinate (TOPROL-XL) 50 MG 24 hr tablet, TAKE ONE AND ONE HALF TABLETS BY MOUTH DAILY   Olmesartan-amLODIPine-HCTZ 40-5-12.5 MG TABS, TAKE 1 TABLET BY MOUTH EVERY DAY   omega-3 acid ethyl esters  (LOVAZA) 1 g capsule, Take 1 g by mouth daily.   spironolactone (ALDACTONE) 25 MG tablet, Take 0.5 tablets (12.5 mg total) by mouth at bedtime.  Current Outpatient Medications (Respiratory):    budesonide-formoterol (SYMBICORT) 160-4.5 MCG/ACT inhaler, TAKE 2 PUFFS BY MOUTH TWICE A DAY   Fluticasone-Umeclidin-Vilant (TRELEGY ELLIPTA) 100-62.5-25 MCG/INH AEPB, Inhale 1 puff into the lungs daily.   PROAIR HFA 108 (90 Base) MCG/ACT inhaler, TAKE 2 PUFFS BY MOUTH EVERY 6 HOURS AS NEEDED FOR WHEEZE OR SHORTNESS OF BREATH  Current Outpatient Medications (Analgesics):    acetaminophen (TYLENOL) 500 MG tablet, Take 1,000 mg by mouth  every 6 (six) hours as needed for mild pain.    traMADol (ULTRAM) 50 MG tablet, TAKE 1 TABLET BY MOUTH EVERY 6 HOURS AS NEEDED  Current Outpatient Medications (Hematological):    ELIQUIS 5 MG TABS tablet, TAKE 1 TABLET BY MOUTH TWICE A DAY  Current Outpatient Medications (Other):    ALPRAZolam (XANAX) 0.5 MG tablet, TAKE 1 TABLET BY MOUTH TWICE A DAY AS NEEDED FOR ANXIETY   cholecalciferol (VITAMIN D) 1000 units tablet, Take 1,000 Units by mouth daily.   docusate sodium (COLACE) 100 MG capsule, Take 100 mg by mouth daily as needed for mild constipation.   gabapentin (NEURONTIN) 100 MG capsule, TAKE 1 CAPSULE BY MOUTH THREE TIMES A DAY   Multiple Vitamin (MULTIVITAMIN WITH MINERALS) TABS tablet, Take 1 tablet by mouth daily.    temazepam (RESTORIL) 30 MG capsule, Take 1 capsule (30 mg total) by mouth at bedtime as needed. for sleep    Past medical history, social, surgical and family history all reviewed in electronic medical record.  No pertanent information unless stated regarding to the chief complaint.   Review of Systems:  No headache, visual changes, nausea, vomiting, diarrhea, constipation, dizziness, abdominal pain, skin rash, fevers, chills, night sweats, weight loss, swollen lymph nodes, body aches, joint swelling, muscle aches, chest pain,  shortness of breath, mood changes.   Objective  Blood pressure 116/62, pulse 82, height 5\' 6"  (1.676 m), weight 202 lb (91.6 kg), SpO2 98 %.   General: No apparent distress alert and oriented x3 mood and affect normal, dressed appropriately.  HEENT: Pupils equal, extraocular movements intact  Respiratory: Patient's speak in full sentences and does not appear short of breath  Cardiovascular: No lower extremity edema, non tender, no erythema  Skin: Warm dry intact with no signs of infection or rash on extremities or on axial skeleton.  Abdomen: Soft nontender  Neuro: Cranial nerves II through XII are intact, neurovascularly intact in all extremities with 2+ DTRs and 2+ pulses.  Lymph: No lymphadenopathy of posterior or anterior cervical chain or axillae bilaterally.  Gait antalgic gait abnormality MSK:  tender with limited range of motion and good stability and symmetric strength and tone of shoulders, elbows, wrist, hip and ankles bilaterally.  Knee: Right valgus deformity noted. Large thigh to calf ratio.  Effusion noted Tender to palpation over medial and PF joint line.  ROM full in flexion and extension and lower leg rotation. instability with valgus force.  painful patellar compression. Patellar glide with moderate crepitus. Patellar and quadriceps tendons unremarkable. Contralateral knee mild arthritis    Impression and Recommendations:     This case required medical decision making of moderate complexity. The above documentation has been reviewed and is accurate and complete Lyndal Pulley, DO       Note: This dictation was prepared with Dragon dictation along with smaller phrase technology. Any transcriptional errors that result from this process are unintentional.

## 2019-01-10 ENCOUNTER — Ambulatory Visit (INDEPENDENT_AMBULATORY_CARE_PROVIDER_SITE_OTHER): Payer: PPO | Admitting: Family Medicine

## 2019-01-10 ENCOUNTER — Encounter: Payer: Self-pay | Admitting: Family Medicine

## 2019-01-10 ENCOUNTER — Other Ambulatory Visit: Payer: Self-pay

## 2019-01-10 DIAGNOSIS — M1711 Unilateral primary osteoarthritis, right knee: Secondary | ICD-10-CM | POA: Diagnosis not present

## 2019-01-10 DIAGNOSIS — M170 Bilateral primary osteoarthritis of knee: Secondary | ICD-10-CM | POA: Diagnosis not present

## 2019-01-10 NOTE — Patient Instructions (Signed)
Good to see you  See me again in 5-6 weeks  

## 2019-01-10 NOTE — Assessment & Plan Note (Signed)
Patient did not like the custom OA brace.  Continues to have instability and swelling.  Patient was avoid any surgical intervention.  Patient wants to hold and tried a hinged brace today.  Hopefully this will be more beneficial.  Discussed icing regimen, topical anti-inflammatories that are over-the-counter, icing regimen.  Follow-up again in 5 weeks.  Worsening symptoms consider aspiration and injection.  Spent  25 minutes with patient face-to-face and had greater than 50% of counseling including as described above in assessment and plan.

## 2019-01-14 ENCOUNTER — Other Ambulatory Visit: Payer: Self-pay | Admitting: Internal Medicine

## 2019-01-18 NOTE — Telephone Encounter (Signed)
Done erx 

## 2019-01-26 DIAGNOSIS — G4733 Obstructive sleep apnea (adult) (pediatric): Secondary | ICD-10-CM | POA: Diagnosis not present

## 2019-02-16 ENCOUNTER — Ambulatory Visit: Payer: PPO | Attending: Internal Medicine

## 2019-02-16 DIAGNOSIS — Z20822 Contact with and (suspected) exposure to covid-19: Secondary | ICD-10-CM

## 2019-02-16 DIAGNOSIS — Z20828 Contact with and (suspected) exposure to other viral communicable diseases: Secondary | ICD-10-CM | POA: Diagnosis not present

## 2019-02-18 LAB — NOVEL CORONAVIRUS, NAA: SARS-CoV-2, NAA: NOT DETECTED

## 2019-02-21 ENCOUNTER — Ambulatory Visit: Payer: PPO | Admitting: Family Medicine

## 2019-02-21 ENCOUNTER — Telehealth: Payer: Self-pay

## 2019-02-21 NOTE — Telephone Encounter (Signed)
Patient calling and states that he went to his employer with his results and states that they advised him that the results were not good enough. States that he is needing a letter stating that his results are negative and that he can return to work. Please advise. Would like a call once this letter is ready for pick up. States that he DOES NOT want it sent through [facility]. CB#: [phone]

## 2019-02-21 NOTE — Telephone Encounter (Signed)
Patient calling and states that he no longer needs this letter. Please disregard

## 2019-02-21 NOTE — Telephone Encounter (Signed)
Please advise 

## 2019-02-21 NOTE — Telephone Encounter (Signed)
Patient called directly into the office and requested most recent COVID results. Advised that results are negative. Patient requesting to come to office and pick up these results. Results printed and placed in pick up cabinet.

## 2019-02-26 DIAGNOSIS — G4733 Obstructive sleep apnea (adult) (pediatric): Secondary | ICD-10-CM | POA: Diagnosis not present

## 2019-03-07 ENCOUNTER — Ambulatory Visit (INDEPENDENT_AMBULATORY_CARE_PROVIDER_SITE_OTHER): Payer: PPO

## 2019-03-07 ENCOUNTER — Other Ambulatory Visit: Payer: Self-pay

## 2019-03-07 ENCOUNTER — Ambulatory Visit (INDEPENDENT_AMBULATORY_CARE_PROVIDER_SITE_OTHER): Payer: PPO | Admitting: Family Medicine

## 2019-03-07 ENCOUNTER — Encounter: Payer: Self-pay | Admitting: Family Medicine

## 2019-03-07 VITALS — BP 116/80 | HR 74 | Ht 66.0 in | Wt 202.0 lb

## 2019-03-07 DIAGNOSIS — M25561 Pain in right knee: Secondary | ICD-10-CM

## 2019-03-07 DIAGNOSIS — G8929 Other chronic pain: Secondary | ICD-10-CM | POA: Diagnosis not present

## 2019-03-07 DIAGNOSIS — M1711 Unilateral primary osteoarthritis, right knee: Secondary | ICD-10-CM

## 2019-03-07 NOTE — Progress Notes (Signed)
Scott Scott [name] [place] Phone: [phone] Subjective:   Scott Scott [name], am serving as a scribe for Dr. Hulan [name]. This visit occurred during the SARS-CoV-2 public health emergency.  Safety protocols were in place, including screening questions prior to the visit, additional usage of staff PPE, and extensive cleaning of exam room while observing appropriate contact time as indicated for disinfecting solutions.   I'm seeing this patient by the request  of:    CC: Right knee pain follow-up  [id number]   01/10/2019 Patient did not like the custom OA brace.  Continues to have instability and swelling.  Patient was avoid any surgical intervention.  Patient wants to hold and tried a hinged brace today.  Hopefully this will be more beneficial.  Discussed icing regimen, topical anti-inflammatories that are over-the-counter, icing regimen.  Follow-up again in 5 weeks.  Worsening symptoms consider aspiration and injection.  Spent  25 minutes with patient face-to-face and had greater than 50% of counseling including as described above in assessment and plan.  Update 03/07/2019 Scott Scott [name] [name] is a 64 y.o. male coming in with complaint of right knee pain. Patient states that his pain has been worsening. Pain increases at night. Walks 18,000 steps 4 days a week.  Increasing instability even with the brace.  Patient has fallen 4 times over the course since we have seen him again.  Patient is concerned he is going to fall and hurt himself at some point.  Has done formal physical therapy, custom bracing, home exercises and icing regimen.  Patient has failed all injections including viscosupplementation.     Past Medical History:  Diagnosis Date  . ALLERGIC RHINITIS 01/14/2007  . ANXIETY 09/28/2006  . Asbestos exposure   . Atrial fibrillation ([place]) 09/28/2006   s/p afib ablation x 2  . CHF (congestive heart failure) ([place])   . COPD (chronic  obstructive pulmonary disease) ([place])   . Coronary artery disease 11/24/2011  . Diastolic dysfunction 99991111  . Dyspnea    W/ EXERTION   . Dysrhythmia    AFIB     . Gallstones   . HYPERLIPIDEMIA 09/28/2006  . HYPERTENSION 09/28/2006  . HYPOTHYROIDISM, ACQUIRED NEC 09/28/2006   pt denies and doesn't know  . Long term current use of anticoagulant 04/08/2010  . OBSTRUCTIVE SLEEP APNEA 01/06/2008   NOT USING    . Right knee DJD 07/09/2010  . S/P CABG x 1 11/26/2011   Right internal mammary artery to right coronary artery  . S/P Maze operation for atrial fibrillation 11/26/2011   complete biatrial lesion set with clipping of LA appendage   Past Surgical History:  Procedure Laterality Date  . ANTERIOR CERVICAL DECOMP/DISCECTOMY FUSION N/A 06/20/2016   Procedure: Anterior Cervical Discectomy Fusion - Cervical five-Cervical six - Cervical six-Cervical seven - Cervicla seven- Thoracic one;  Surgeon: Earnie Larsson, MD;  Location: [facility];  Service: Neurosurgery;  Laterality: N/A;  . CARDIOVERSION     X4   . CERVICAL FUSION  06/20/2016   C5-6, C6-7, C7-T1 anterior cervical discectomy with interbody fusion utilizing interbody peek cages, locally harvested autograft, and anterior plate instrumentation  . CORONARY ARTERY BYPASS GRAFT  11/26/2011   Procedure: CORONARY ARTERY BYPASS GRAFTING (CABG);  Surgeon: Rexene Alberts, MD;  Location: [facility];  Service: Open Heart Surgery;  Laterality: N/A;  coronary artery bypass on pump times one utilizing the right internal mammary artery, transesophageal echocardiogram   . hand surgury Left   .  LEFT HEART CATHETERIZATION WITH CORONARY ANGIOGRAM N/A 11/24/2011   Procedure: LEFT HEART CATHETERIZATION WITH CORONARY ANGIOGRAM;  Surgeon: Peter M Martinique, MD;  Location[facility] LAB;  Service: Cardiovascular;  Laterality: N/A;  . LOOP RECORDER IMPLANT N/A 07/06/2012   Procedure: LOOP RECORDER IMPLANT;  Surgeon: Thompson Grayer, MD;  Location: [facility] CATH LAB;  Service: Cardiovascular;   Laterality: N/A;  . MAZE  11/26/2011   Procedure: MAZE;  Surgeon: Rexene Alberts, MD;  Location: [place];  Service: Open Heart Surgery;  Laterality: N/A;  Cryomaze   . Radiofrequency Ablation for atrial fibrillation  12/30/07 and 06/05/09   afib ablation x 2 by JA  . Radiofrequency ablation for atrial flutter  2005   CTI   Social History   Socioeconomic History  . Marital status: Single    Spouse name: Not on file  . Number of children: 0  . Years of education: Not on file  . Highest education level: Not on file  Occupational History  . Occupation: works at the [employer]  . Smoking status: Former Smoker    Packs/day: 1.50    Years: 26.00    Pack years: 39.00    Types: Cigarettes    Quit date: 04/06/1995    Years since quitting: 23.9  . Smokeless tobacco: Never Used  Substance and Sexual Activity  . Alcohol use: Scott [name]    Alcohol/week: 0.0 standard drinks  . Drug use: Scott [name]  . Sexual activity: Yes  Other Topics Concern  . Not on file  Social History Narrative   Pt lives in [place], [place] with his friend.    Social Determinants of Health   Financial Resource Strain:   . Difficulty of Paying Living Expenses: Not on file  Food Insecurity:   . Worried About Charity fundraiser in the Last Year: Not on file  . Ran Out of Food in the Last Year: Not on file  Transportation Needs:   . Lack of Transportation (Medical): Not on file  . Lack of Transportation (Non-Medical): Not on file  Physical Activity: Sufficiently Active  . Days of Exercise per Week: 5 days  . Minutes of Exercise per Session: 80 min  Stress:   . Feeling of Stress : Not on file  Social Connections:   . Frequency of Communication with Friends and Family: Not on file  . Frequency of Social Gatherings with Friends and Family: Not on file  . Attends Religious Services: Not on file  . Active Member of Clubs or Organizations: Not on file  . Attends Archivist Meetings: Not on file  .  Marital Status: Not on file   Allergies  Allergen Reactions  . Zolpidem Tartrate Other (See Comments)    Extremely drowsy the next day   Family History  Problem Relation Age of Onset  . Dementia Father   . Hypertension Father   . Atrial fibrillation Mother   . Stroke Mother   . Dementia Paternal Grandfather   . Cancer Paternal Grandmother        type unknown     Current Outpatient Medications (Cardiovascular):  .  furosemide (LASIX) 20 MG tablet, Take 1 tablet (20 mg total) by mouth as needed. Call the office if needed > twice weekly. Marland Kitchen  lovastatin (MEVACOR) 40 MG tablet, TAKE 2 TABLETS (80 MG TOTAL) BY MOUTH EVERY EVENING. .  metoprolol succinate (TOPROL-XL) 50 MG 24 hr tablet, TAKE 1 AND 1/2 TABLETS BY MOUTH DAILY .  Olmesartan-amLODIPine-HCTZ 40-5-12.5 MG TABS, TAKE 1 TABLET BY MOUTH EVERY DAY .  omega-3 acid ethyl esters (LOVAZA) 1 g capsule, Take 1 g by mouth daily. Marland Kitchen  spironolactone (ALDACTONE) 25 MG tablet, Take 0.5 tablets (12.5 mg total) by mouth at bedtime.  Current Outpatient Medications (Respiratory):  .  budesonide-formoterol (SYMBICORT) 160-4.5 MCG/ACT inhaler, TAKE 2 PUFFS BY MOUTH TWICE A DAY .  Fluticasone-Umeclidin-Vilant (TRELEGY ELLIPTA) 100-62.5-25 MCG/INH AEPB, Inhale 1 puff into the lungs daily. Marland Kitchen  PROAIR HFA 108 (90 Base) MCG/ACT inhaler, TAKE 2 PUFFS BY MOUTH EVERY 6 HOURS AS NEEDED FOR WHEEZE OR SHORTNESS OF BREATH  Current Outpatient Medications (Analgesics):  .  acetaminophen (TYLENOL) 500 MG tablet, Take 1,000 mg by mouth every 6 (six) hours as needed for mild pain.  .  traMADol (ULTRAM) 50 MG tablet, TAKE 1 TABLET BY MOUTH EVERY 6 HOURS AS NEEDED  Current Outpatient Medications (Hematological):  Marland Kitchen  ELIQUIS 5 MG TABS tablet, TAKE 1 TABLET BY MOUTH TWICE A DAY  Current Outpatient Medications (Other):  Marland Kitchen  ALPRAZolam (XANAX) 0.5 MG tablet, TAKE 1 TABLET BY MOUTH TWICE A DAY AS NEEDED FOR ANXIETY .  cholecalciferol (VITAMIN D) 1000 units tablet, Take  1,000 Units by mouth daily. Marland Kitchen  docusate sodium (COLACE) 100 MG capsule, Take 100 mg by mouth daily as needed for mild constipation. .  gabapentin (NEURONTIN) 100 MG capsule, TAKE 1 CAPSULE BY MOUTH THREE TIMES A DAY .  Multiple Vitamin (MULTIVITAMIN WITH MINERALS) TABS tablet, Take 1 tablet by mouth daily.  .  temazepam (RESTORIL) 30 MG capsule, Take 1 capsule (30 mg total) by mouth at bedtime as needed. for sleep    Past medical history, social, surgical and family history all reviewed in electronic medical record.  Scott [name] pertanent information unless stated regarding to the chief complaint.   Review of Systems:  Scott [name] headache, visual changes, nausea, vomiting, diarrhea, constipation, dizziness, abdominal pain, skin rash, fevers, chills, night sweats, weight loss, swollen lymph nodes, body aches, joint swelling, muscle aches, chest pain, shortness of breath, mood changes.   Objective  Blood pressure 116/80, pulse 74, height 5\' 6"  (1.676 m), weight 202 lb (91.6 kg), SpO2 98 %.    General: Scott [name] apparent distress alert and oriented x3 mood and affect normal, dressed appropriately.  HEENT: Pupils equal, extraocular movements intact  Respiratory: Patient's speak in full sentences and does not appear short of breath  Cardiovascular: Scott [name] lower extremity edema, non tender, Scott [name] erythema  Skin: Warm dry intact with Scott [name] signs of infection or rash on extremities or on axial skeleton.  Abdomen: Soft nontender  Neuro: Cranial nerves II through XII are intact, neurovascularly intact in all extremities with 2+ DTRs and 2+ pulses.  Lymph: Scott [name] lymphadenopathy of posterior or anterior cervical chain or axillae bilaterally.  Gait severely antalgic Patient's right knee does have significant instability.  Decreasing range of motion now lacking 10 degrees of flexion and 5 degrees of extension.  Instability noted with valgus and varus force.  Crepitus with range of motion.  Effusion noted today    Impression and  Recommendations:     This case required medical decision making of moderate complexity. The above documentation has been reviewed and is accurate and complete Lyndal Pulley, DO       Note: This dictation was prepared with Dragon dictation along with smaller phrase technology. Any transcriptional errors that result from this process are unintentional.

## 2019-03-07 NOTE — Assessment & Plan Note (Signed)
Patient is failed all conservative therapy at this time.  Discussed with patient in great length about icing regimen, home exercise, which activities we would have to avoid.  Patient has done viscosupplementation as well as custom bracing and continues to have instability of the knee.  I do believe that surgical intervention is necessary at this time and will be referred accordingly.  All questions answered.  Follow-up with me again as needed

## 2019-03-07 NOTE — Patient Instructions (Signed)
Take a tylenol when you take tramadol I am here if you need me See me again as needed

## 2019-03-08 ENCOUNTER — Encounter: Payer: Self-pay | Admitting: Cardiology

## 2019-03-08 ENCOUNTER — Telehealth (INDEPENDENT_AMBULATORY_CARE_PROVIDER_SITE_OTHER): Payer: PPO | Admitting: Cardiology

## 2019-03-08 ENCOUNTER — Telehealth: Payer: Self-pay | Admitting: *Deleted

## 2019-03-08 VITALS — BP 116/80 | HR 74 | Ht 66.0 in | Wt 202.0 lb

## 2019-03-08 DIAGNOSIS — E669 Obesity, unspecified: Secondary | ICD-10-CM

## 2019-03-08 DIAGNOSIS — I10 Essential (primary) hypertension: Secondary | ICD-10-CM | POA: Diagnosis not present

## 2019-03-08 DIAGNOSIS — G4733 Obstructive sleep apnea (adult) (pediatric): Secondary | ICD-10-CM

## 2019-03-08 NOTE — Telephone Encounter (Signed)
Order placed for cpap supplies and 1 year follow up made.

## 2019-03-08 NOTE — Telephone Encounter (Signed)
-----   Message from Sueanne Margarita, MD sent at 03/08/2019  3:54 PM EST ----- Please order supplies from DME.  Followup with me in 1 year

## 2019-03-08 NOTE — Telephone Encounter (Signed)
Patient has a 10 week follow up appointment scheduled for 03/08/19. Patient understands he needs to keep this appointment for insurance compliance. Patient was grateful for the call and thanked me.

## 2019-03-08 NOTE — Progress Notes (Signed)
Virtual Visit via Video Note   This visit type was conducted due to national recommendations for restrictions regarding the COVID-19 Pandemic (e.g. social distancing) in an effort to limit this patient's exposure and mitigate transmission in our community.  Due to his co-morbid illnesses, this patient is at least at moderate risk for complications without adequate follow up.  This format is felt to be most appropriate for this patient at this time.  All issues noted in this document were discussed and addressed.  A limited physical exam was performed with this format.  Please refer to the patient's chart for his consent to telehealth for [facility].   Evaluation Performed:  Follow-up visit  This visit type was conducted due to national recommendations for restrictions regarding the COVID-19 Pandemic (e.g. social distancing).  This format is felt to be most appropriate for this patient at this time.  All issues noted in this document were discussed and addressed.  No physical exam was performed (except for noted visual exam findings with Video Visits).  Please refer to the patient's chart (MyChart message for video visits and phone note for telephone visits) for the patient's consent to telehealth for [facility].  Date:  03/08/2019   ID:  Scott [name], DOB 09-19-1955, MRN [id number]  Patient Location:  Home  Provider location:   [place]  PCP:  [name], MD  Cardiologist:  Cristopher Peru, MD Electrophysiologist:  None   Chief Complaint:  OSA  History of Present Illness:    Scott [name] is a 64 y.o. male who presents via audio/video conferencing for a telehealth visit today.    This is a 64yo male with a hx of PAF, CHF and CAD who was referred for sleep study due to afib.  He underwent Sleep study showing severe OSA with an AHI of 74/hr and O2 sats as low as 83% with loud snoring.  He underwent CPAP titration to 9cm H2O.    He is doing well with his CPAP device and  thinks that he has gotten used to it.  He tolerates the mask and feels the pressure is adequate.  Since going on CPAP he feels rested in the am and has no significant daytime sleepiness.  He denies any significant mouth or nasal dryness or nasal congestion.  He does not think that he snores.  He is having some problems with his water reservoir running out of H2O before the night is done.    The patient does not have symptoms concerning for COVID-19 infection (fever, chills, cough, or new shortness of breath).   Prior CV studies:   The following studies were reviewed today:  PAP compliance download  Past Medical History:  Diagnosis Date  . ALLERGIC RHINITIS 01/14/2007  . ANXIETY 09/28/2006  . Asbestos exposure   . Atrial fibrillation ([place]) 09/28/2006   s/p afib ablation x 2  . CHF (congestive heart failure) ([place])   . COPD (chronic obstructive pulmonary disease) ([place])   . Coronary artery disease 11/24/2011  . Diastolic dysfunction 99991111  . Dyspnea    W/ EXERTION   . Dysrhythmia    AFIB     . Gallstones   . HYPERLIPIDEMIA 09/28/2006  . HYPERTENSION 09/28/2006  . HYPOTHYROIDISM, ACQUIRED NEC 09/28/2006   pt denies and doesn't know  . Long term current use of anticoagulant 04/08/2010  . OBSTRUCTIVE SLEEP APNEA 01/06/2008   NOT USING    . Right knee DJD 07/09/2010  . S/P CABG x 1  11/26/2011   Right internal mammary artery to right coronary artery  . S/P Maze operation for atrial fibrillation 11/26/2011   complete biatrial lesion set with clipping of LA appendage   Past Surgical History:  Procedure Laterality Date  . ANTERIOR CERVICAL DECOMP/DISCECTOMY FUSION N/A 06/20/2016   Procedure: Anterior Cervical Discectomy Fusion - Cervical five-Cervical six - Cervical six-Cervical seven - Cervicla seven- Thoracic one;  Surgeon: Earnie Larsson, MD;  Location: [facility];  Service: Neurosurgery;  Laterality: N/A;  . CARDIOVERSION     X4   . CERVICAL FUSION  06/20/2016   C5-6, C6-7, C7-T1 anterior cervical  discectomy with interbody fusion utilizing interbody peek cages, locally harvested autograft, and anterior plate instrumentation  . CORONARY ARTERY BYPASS GRAFT  11/26/2011   Procedure: CORONARY ARTERY BYPASS GRAFTING (CABG);  Surgeon: Rexene Alberts, MD;  Location: [facility];  Service: Open Heart Surgery;  Laterality: N/A;  coronary artery bypass on pump times one utilizing the right internal mammary artery, transesophageal echocardiogram   . hand surgury Left   . LEFT HEART CATHETERIZATION WITH CORONARY ANGIOGRAM N/A 11/24/2011   Procedure: LEFT HEART CATHETERIZATION WITH CORONARY ANGIOGRAM;  Surgeon: Peter M Martinique, MD;  Location: [facility] CATH LAB;  Service: Cardiovascular;  Laterality: N/A;  . LOOP RECORDER IMPLANT N/A 07/06/2012   Procedure: LOOP RECORDER IMPLANT;  Surgeon: Thompson Grayer, MD;  Location[facility] CATH LAB;  Service: Cardiovascular;  Laterality: N/A;  . MAZE  11/26/2011   Procedure: MAZE;  Surgeon: Rexene Alberts, MD;  Location: [facility];  Service: Open Heart Surgery;  Laterality: N/A;  Cryomaze   . Radiofrequency Ablation for atrial fibrillation  12/30/07 and 06/05/09   afib ablation x 2 by JA  . Radiofrequency ablation for atrial flutter  2005   CTI     Current Meds  Medication Sig  . acetaminophen (TYLENOL) 500 MG tablet Take 1,000 mg by mouth every 6 (six) hours as needed for mild pain.   Marland Kitchen ALPRAZolam (XANAX) 0.5 MG tablet TAKE 1 TABLET BY MOUTH TWICE A DAY AS NEEDED FOR ANXIETY  . budesonide-formoterol (SYMBICORT) 160-4.5 MCG/ACT inhaler TAKE 2 PUFFS BY MOUTH TWICE A DAY  . cholecalciferol (VITAMIN D) 1000 units tablet Take 1,000 Units by mouth daily.  Marland Kitchen docusate sodium (COLACE) 100 MG capsule Take 100 mg by mouth daily as needed for mild constipation.  Marland Kitchen ELIQUIS 5 MG TABS tablet TAKE 1 TABLET BY MOUTH TWICE A DAY  . furosemide (LASIX) 20 MG tablet Take 1 tablet (20 mg total) by mouth as needed. Call the office if needed > twice weekly.  Marland Kitchen gabapentin (NEURONTIN) 100 MG capsule TAKE 1  CAPSULE BY MOUTH THREE TIMES A DAY  . lovastatin (MEVACOR) 40 MG tablet TAKE 2 TABLETS (80 MG TOTAL) BY MOUTH EVERY EVENING.  . metoprolol succinate (TOPROL-XL) 50 MG 24 hr tablet TAKE 1 AND 1/2 TABLETS BY MOUTH DAILY  . Multiple Vitamin (MULTIVITAMIN WITH MINERALS) TABS tablet Take 1 tablet by mouth daily.   . Olmesartan-amLODIPine-HCTZ 40-5-12.5 MG TABS TAKE 1 TABLET BY MOUTH EVERY DAY  . omega-3 acid ethyl esters (LOVAZA) 1 g capsule Take 1 g by mouth daily.  Marland Kitchen PROAIR HFA 108 (90 Base) MCG/ACT inhaler TAKE 2 PUFFS BY MOUTH EVERY 6 HOURS AS NEEDED FOR WHEEZE OR SHORTNESS OF BREATH  . spironolactone (ALDACTONE) 25 MG tablet Take 0.5 tablets (12.5 mg total) by mouth at bedtime.  . temazepam (RESTORIL) 30 MG capsule Take 1 capsule (30 mg total) by mouth at bedtime as needed.  for sleep  . traMADol (ULTRAM) 50 MG tablet TAKE 1 TABLET BY MOUTH EVERY 6 HOURS AS NEEDED     Allergies:   Zolpidem tartrate   Social History   Tobacco Use  . Smoking status: Former Smoker    Packs/day: 1.50    Years: 26.00    Pack years: 39.00    Types: Cigarettes    Quit date: 04/06/1995    Years since quitting: 23.9  . Smokeless tobacco: Never Used  Substance Use Topics  . Alcohol use: No    Alcohol/week: 0.0 standard drinks  . Drug use: No     Family Hx: The patient's family history includes Atrial fibrillation in his mother; Cancer in his paternal grandmother; Dementia in his father and paternal grandfather; Hypertension in his father; Stroke in his mother.  ROS:   Please see the history of present illness.     All other systems reviewed and are negative.   Labs/Other Tests and Data Reviewed:    Recent Labs: 09/06/2018: ALT 25; Hemoglobin 15.0; Platelets 180.0; Pro B Natriuretic peptide (BNP) 134.0; TSH 3.36 11/29/2018: BUN 24; Creatinine, Ser 1.45; Potassium 4.1; Sodium 142   Recent Lipid Panel Lab Results  Component Value Date/Time   CHOL 148 03/05/2018 10:05 AM   TRIG 73.0 03/05/2018 10:05  AM   HDL 44.50 03/05/2018 10:05 AM   CHOLHDL 3 03/05/2018 10:05 AM   LDLCALC 89 03/05/2018 10:05 AM    Wt Readings from Last 3 Encounters:  03/08/19 202 lb (91.6 kg)  03/07/19 202 lb (91.6 kg)  01/10/19 202 lb (91.6 kg)     Objective:    Vital Signs:  BP 116/80 Comment: Taken yesterday at OV  Pulse 74 Comment: Taken yesterday at OV  Ht 5\' 6"  (1.676 m)   Wt 202 lb (91.6 kg)   BMI 32.60 kg/m    CONSTITUTIONAL:  Well nourished, well developed male in no acute distress.  EYES: anicteric MOUTH: oral mucosa is pink RESPIRATORY: Normal respiratory effort, symmetric expansion CARDIOVASCULAR: No peripheral edema SKIN: No rash, lesions or ulcers MUSCULOSKELETAL: no digital cyanosis NEURO: Cranial Nerves II-XII grossly intact, moves all extremities PSYCH: Intact judgement and insight.  A&O x 3, Mood/affect appropriate   ASSESSMENT & PLAN:    1.  OSA - The patient is tolerating PAP therapy well without any problems. The PAP download was reviewed today and showed an AHI of 4/hr on 9 cm H2O with 100% compliance in using more than 4 hours nightly.  The patient has been using and benefiting from PAP use and will continue to benefit from therapy.   2.  HTN -BP controlled -continue Toprol XL 75mg  daily, Olmesartan-amlodipine-HCTZ 40-5-12.5mg  daily and spiro 12.5mg  daily.  3.  Obesity -I have encouraged him to get into a routine exercise program and cut back on carbs and portions.     COVID-19 Education: The signs and symptoms of COVID-19 were discussed with the patient and how to seek care for testing (follow up with PCP or arrange E-visit).  The importance of social distancing was discussed today.  Patient Risk:   After full review of this patient's clinical status, I feel that they are at least moderate risk at this time.  Time:   Today, I have spent 20 minutes on telemedicine discussing medical problems including telemedicine.  We also reviewed the symptoms of COVID 19 and the  ways to protect against contracting the virus with telehealth technology.  I spent an additional 5 minutes reviewing patient's chart including  PAP compliance download.  Medication Adjustments/Labs and Tests Ordered: Current medicines are reviewed at length with the patient today.  Concerns regarding medicines are outlined above.  Tests Ordered: No orders of the defined types were placed in this encounter.  Medication Changes: No orders of the defined types were placed in this encounter.   Disposition:  Follow up in 1 year(s)  Signed, Fransico Him, MD  03/08/2019 3:48 PM    [place]

## 2019-03-14 ENCOUNTER — Encounter: Payer: Self-pay | Admitting: Internal Medicine

## 2019-03-14 ENCOUNTER — Ambulatory Visit (INDEPENDENT_AMBULATORY_CARE_PROVIDER_SITE_OTHER): Payer: PPO | Admitting: Internal Medicine

## 2019-03-14 ENCOUNTER — Other Ambulatory Visit: Payer: Self-pay

## 2019-03-14 VITALS — BP 118/64 | HR 90 | Temp 98.1°F | Ht 66.0 in | Wt 204.0 lb

## 2019-03-14 DIAGNOSIS — N183 Chronic kidney disease, stage 3 unspecified: Secondary | ICD-10-CM | POA: Diagnosis not present

## 2019-03-14 DIAGNOSIS — M1711 Unilateral primary osteoarthritis, right knee: Secondary | ICD-10-CM | POA: Diagnosis not present

## 2019-03-14 DIAGNOSIS — R739 Hyperglycemia, unspecified: Secondary | ICD-10-CM | POA: Diagnosis not present

## 2019-03-14 DIAGNOSIS — I5042 Chronic combined systolic (congestive) and diastolic (congestive) heart failure: Secondary | ICD-10-CM | POA: Diagnosis not present

## 2019-03-14 DIAGNOSIS — Z0000 Encounter for general adult medical examination without abnormal findings: Secondary | ICD-10-CM | POA: Diagnosis not present

## 2019-03-14 LAB — PSA: PSA: 0.89 ng/mL (ref 0.10–4.00)

## 2019-03-14 LAB — CBC WITH DIFFERENTIAL/PLATELET
Basophils Absolute: 0.1 10*3/uL (ref 0.0–0.1)
Basophils Relative: 0.7 % (ref 0.0–3.0)
Eosinophils Absolute: 0.3 10*3/uL (ref 0.0–0.7)
Eosinophils Relative: 3.6 % (ref 0.0–5.0)
HCT: 44.2 % (ref 39.0–52.0)
Hemoglobin: 14.8 g/dL (ref 13.0–17.0)
Lymphocytes Relative: 13.8 % (ref 12.0–46.0)
Lymphs Abs: 1.2 10*3/uL (ref 0.7–4.0)
MCHC: 33.5 g/dL (ref 30.0–36.0)
MCV: 95.3 fl (ref 78.0–100.0)
Monocytes Absolute: 0.7 10*3/uL (ref 0.1–1.0)
Monocytes Relative: 7.9 % (ref 3.0–12.0)
Neutro Abs: 6.4 10*3/uL (ref 1.4–7.7)
Neutrophils Relative %: 74 % (ref 43.0–77.0)
Platelets: 216 10*3/uL (ref 150.0–400.0)
RBC: 4.64 Mil/uL (ref 4.22–5.81)
RDW: 13 % (ref 11.5–15.5)
WBC: 8.7 10*3/uL (ref 4.0–10.5)

## 2019-03-14 LAB — URINALYSIS, ROUTINE W REFLEX MICROSCOPIC
Bilirubin Urine: NEGATIVE
Hgb urine dipstick: NEGATIVE
Ketones, ur: NEGATIVE
Leukocytes,Ua: NEGATIVE
Nitrite: NEGATIVE
RBC / HPF: NONE SEEN (ref 0–?)
Specific Gravity, Urine: >=1.03 — AB (ref 1.000–1.030)
Total Protein, Urine: NEGATIVE
Urine Glucose: NEGATIVE
Urobilinogen, UA: 0.2 (ref 0.0–1.0)
WBC, UA: NONE SEEN (ref 0–?)
pH: 5 (ref 5.0–8.0)

## 2019-03-14 LAB — TSH: TSH: 1.89 u[IU]/mL (ref 0.35–4.50)

## 2019-03-14 LAB — HEMOGLOBIN A1C: Hgb A1c MFr Bld: 5.2 % (ref 4.6–6.5)

## 2019-03-14 NOTE — Assessment & Plan Note (Signed)
To see ortho to f/u, currently high risk for fall with injury on eliquis

## 2019-03-14 NOTE — Assessment & Plan Note (Signed)
stable overall by history and exam, recent data reviewed with pt, and pt to continue medical treatment as before,  to f/u any worsening symptoms or concerns  

## 2019-03-14 NOTE — Patient Instructions (Signed)

## 2019-03-14 NOTE — Progress Notes (Signed)
Subjective:    Patient ID: Scott [name], male    DOB: [birth year], 64 y.o.   MRN: [id number]  HPI  Here for wellness and f/u;  Overall doing ok;  Pt denies Chest pain, worsening SOB, DOE, wheezing, orthopnea, PND, worsening LE edema, palpitations, dizziness or syncope.  Pt denies neurological change such as new headache, facial or extremity weakness.  Pt denies polydipsia, polyuria, or low sugar symptoms. Pt states overall good compliance with treatment and medications, good tolerability, and has been trying to follow appropriate diet.  Pt denies worsening depressive symptoms, suicidal ideation or panic. No fever, night sweats, wt loss, loss of appetite, or other constitutional symptoms.  Pt states good ability with ADL's, has low fall risk, home safety reviewed and adequate, no other significant changes in hearing or vision, and only occasionally active with exercise with walking the dog a mile per day.   Wt Readings from Last 3 Encounters:  03/14/19 204 lb (92.5 kg)  03/08/19 202 lb (91.6 kg)  03/07/19 202 lb (91.6 kg)  Has fallen about 5 times since thanksgiving with right knee giveaways, and plans to see Dr Rhona Raider ortho soon.   Past Medical History:  Diagnosis Date  . ALLERGIC RHINITIS 01/14/2007  . ANXIETY 09/28/2006  . Asbestos exposure   . Atrial fibrillation ([place]) 09/28/2006   s/p afib ablation x 2  . CHF (congestive heart failure) ([place])   . COPD (chronic obstructive pulmonary disease) ([place])   . Coronary artery disease 11/24/2011  . Diastolic dysfunction 99991111  . Dyspnea    W/ EXERTION   . Dysrhythmia    AFIB     . Gallstones   . HYPERLIPIDEMIA 09/28/2006  . HYPERTENSION 09/28/2006  . HYPOTHYROIDISM, ACQUIRED NEC 09/28/2006   pt denies and doesn't know  . Long term current use of anticoagulant 04/08/2010  . OBSTRUCTIVE SLEEP APNEA 01/06/2008   NOT USING    . Right knee DJD 07/09/2010  . S/P CABG x 1 11/26/2011   Right internal mammary artery to right coronary artery  . S/P Maze  operation for atrial fibrillation 11/26/2011   complete biatrial lesion set with clipping of LA appendage   Past Surgical History:  Procedure Laterality Date  . ANTERIOR CERVICAL DECOMP/DISCECTOMY FUSION N/A 06/20/2016   Procedure: Anterior Cervical Discectomy Fusion - Cervical five-Cervical six - Cervical six-Cervical seven - Cervicla seven- Thoracic one;  Surgeon: Earnie Larsson, MD;  Location: [facility];  Service: Neurosurgery;  Laterality: N/A;  . CARDIOVERSION     X4   . CERVICAL FUSION  06/20/2016   C5-6, C6-7, C7-T1 anterior cervical discectomy with interbody fusion utilizing interbody peek cages, locally harvested autograft, and anterior plate instrumentation  . CORONARY ARTERY BYPASS GRAFT  11/26/2011   Procedure: CORONARY ARTERY BYPASS GRAFTING (CABG);  Surgeon: Rexene Alberts, MD;  Location: [place];  Service: Open Heart Surgery;  Laterality: N/A;  coronary artery bypass on pump times one utilizing the right internal mammary artery, transesophageal echocardiogram   . hand surgury Left   . LEFT HEART CATHETERIZATION WITH CORONARY ANGIOGRAM N/A 11/24/2011   Procedure: LEFT HEART CATHETERIZATION WITH CORONARY ANGIOGRAM;  Surgeon: Peter M Martinique, MD;  Location[facility] CATH LAB;  Service: Cardiovascular;  Laterality: N/A;  . LOOP RECORDER IMPLANT N/A 07/06/2012   Procedure: LOOP RECORDER IMPLANT;  Surgeon: Thompson Grayer, MD;  Location: [facility] CATH LAB;  Service: Cardiovascular;  Laterality: N/A;  . MAZE  11/26/2011   Procedure: MAZE;  Surgeon: Rexene Alberts, MD;  Location:  [place] [facility];  Service: Open Heart Surgery;  Laterality: N/A;  Cryomaze   . Radiofrequency Ablation for atrial fibrillation  12/30/07 and 06/05/09   afib ablation x 2 by JA  . Radiofrequency ablation for atrial flutter  2005   CTI    reports that he quit smoking about 23 years ago. His smoking use included cigarettes. He has a 39.00 pack-year smoking history. He has never used smokeless tobacco. He reports that he does not drink alcohol or use  drugs. family history includes Atrial fibrillation in his mother; Cancer in his paternal grandmother; Dementia in his father and paternal grandfather; Hypertension in his father; Stroke in his mother. Allergies  Allergen Reactions  . Zolpidem Tartrate Other (See Comments)    Extremely drowsy the next day   Current Outpatient Medications on File Prior to Visit  Medication Sig Dispense Refill  . acetaminophen (TYLENOL) 500 MG tablet Take 1,000 mg by mouth every 6 (six) hours as needed for mild pain.     Marland Kitchen ALPRAZolam (XANAX) 0.5 MG tablet TAKE 1 TABLET BY MOUTH TWICE A DAY AS NEEDED FOR ANXIETY 180 tablet 1  . budesonide-formoterol (SYMBICORT) 160-4.5 MCG/ACT inhaler TAKE 2 PUFFS BY MOUTH TWICE A DAY 30.6 Inhaler 4  . cholecalciferol (VITAMIN D) 1000 units tablet Take 1,000 Units by mouth daily.    Marland Kitchen docusate sodium (COLACE) 100 MG capsule Take 100 mg by mouth daily as needed for mild constipation.    Marland Kitchen ELIQUIS 5 MG TABS tablet TAKE 1 TABLET BY MOUTH TWICE A DAY 180 tablet 1  . furosemide (LASIX) 20 MG tablet Take 1 tablet (20 mg total) by mouth as needed. Call the office if needed > twice weekly. 90 tablet 3  . gabapentin (NEURONTIN) 100 MG capsule TAKE 1 CAPSULE BY MOUTH THREE TIMES A DAY 270 capsule 2  . lovastatin (MEVACOR) 40 MG tablet TAKE 2 TABLETS (80 MG TOTAL) BY MOUTH EVERY EVENING. 180 tablet 2  . metoprolol succinate (TOPROL-XL) 50 MG 24 hr tablet TAKE 1 AND 1/2 TABLETS BY MOUTH DAILY 135 tablet 1  . Multiple Vitamin (MULTIVITAMIN WITH MINERALS) TABS tablet Take 1 tablet by mouth daily.     . Olmesartan-amLODIPine-HCTZ 40-5-12.5 MG TABS TAKE 1 TABLET BY MOUTH EVERY DAY 90 tablet 1  . omega-3 acid ethyl esters (LOVAZA) 1 g capsule Take 1 g by mouth daily.    Marland Kitchen PROAIR HFA 108 (90 Base) MCG/ACT inhaler TAKE 2 PUFFS BY MOUTH EVERY 6 HOURS AS NEEDED FOR WHEEZE OR SHORTNESS OF BREATH 8.5 Inhaler 11  . temazepam (RESTORIL) 30 MG capsule Take 1 capsule (30 mg total) by mouth at bedtime as  needed. for sleep 90 capsule 2  . traMADol (ULTRAM) 50 MG tablet TAKE 1 TABLET BY MOUTH EVERY 6 HOURS AS NEEDED 120 tablet 2  . spironolactone (ALDACTONE) 25 MG tablet Take 0.5 tablets (12.5 mg total) by mouth at bedtime. 45 tablet 3   No current facility-administered medications on file prior to visit.   Review of Systems  Constitutional: Negative for other unusual diaphoresis or sweats HENT: Negative for ear discharge or swelling Eyes: Negative for other worsening visual disturbances Respiratory: Negative for stridor or other swelling  Gastrointestinal: Negative for worsening distension or other blood Genitourinary: Negative for retention or other urinary change Musculoskeletal: Negative for other MSK pain or swelling Skin: Negative for color change or other new lesions Neurological: Negative for worsening tremors and other numbness  Psychiatric/Behavioral: Negative for worsening agitation or other fatigue All otherwise neg  per pt     Objective:   Physical Exam BP 118/64   Pulse 90   Temp 98.1 F (36.7 C) (Oral)   Ht 5\' 6"  (1.676 m)   Wt 204 lb (92.5 kg)   SpO2 95%   BMI 32.93 kg/m  VS noted,  Constitutional: Pt appears in NAD HENT: Head: NCAT.  Right Ear: External ear normal.  Left Ear: External ear normal.  Eyes: . Pupils are equal, round, and reactive to light. Conjunctivae and EOM are normal Nose: without d/c or deformity Neck: Neck supple. Gross normal ROM Cardiovascular: Normal rate and regular rhythm.   Pulmonary/Chest: Effort normal and breath sounds without rales or wheezing.  Abd:  Soft, NT, ND, + BS, no organomegaly Neurological: Pt is alert. At baseline orientation, motor grossly intact Skin: Skin is warm. No rashes, other new lesions, no LE edema Psychiatric: Pt behavior is normal without agitation  All otherwise neg per pt Lab Results  Component Value Date   WBC 7.6 09/06/2018   HGB 15.0 09/06/2018   HCT 43.9 09/06/2018   PLT 180.0 09/06/2018    GLUCOSE 101 (H) 11/29/2018   CHOL 148 03/05/2018   TRIG 73.0 03/05/2018   HDL 44.50 03/05/2018   LDLCALC 89 03/05/2018   ALT 25 09/06/2018   AST 25 09/06/2018   NA 142 11/29/2018   K 4.1 11/29/2018   CL 104 11/29/2018   CREATININE 1.45 (H) 11/29/2018   BUN 24 11/29/2018   CO2 25 11/29/2018   TSH 3.36 09/06/2018   PSA 1.27 03/05/2018   INR 0.98 06/20/2016   HGBA1C 5.4 09/06/2018         Assessment & Plan:

## 2019-03-15 ENCOUNTER — Encounter: Payer: Self-pay | Admitting: Internal Medicine

## 2019-03-15 LAB — BASIC METABOLIC PANEL
BUN: 29 mg/dL — ABNORMAL HIGH (ref 6–23)
CO2: 24 mEq/L (ref 19–32)
Calcium: 9.4 mg/dL (ref 8.4–10.5)
Chloride: 106 mEq/L (ref 96–112)
Creatinine, Ser: 1.49 mg/dL (ref 0.40–1.50)
GFR: 47.49 mL/min — ABNORMAL LOW (ref >60.00)
Glucose, Bld: 102 mg/dL — ABNORMAL HIGH (ref 70–99)
Potassium: 3.8 mEq/L (ref 3.5–5.1)
Sodium: 142 mEq/L (ref 135–145)

## 2019-03-15 LAB — HEPATIC FUNCTION PANEL
ALT: 25 U/L (ref 0–53)
AST: 26 U/L (ref 0–37)
Albumin: 4.2 g/dL (ref 3.5–5.2)
Alkaline Phosphatase: 51 U/L (ref 39–117)
Bilirubin, Direct: 0.1 mg/dL (ref 0.0–0.3)
Total Bilirubin: 0.4 mg/dL (ref 0.2–1.2)
Total Protein: 6.9 g/dL (ref 6.0–8.3)

## 2019-03-15 LAB — LIPID PANEL
Cholesterol: 135 mg/dL (ref 0–200)
HDL: 41.2 mg/dL (ref >39.00)
LDL Cholesterol: 65 mg/dL (ref 0–99)
NonHDL: 94.21
Total CHOL/HDL Ratio: 3
Triglycerides: 145 mg/dL (ref 0.0–149.0)
VLDL: 29 mg/dL (ref 0.0–40.0)

## 2019-03-18 ENCOUNTER — Telehealth: Payer: Self-pay | Admitting: *Deleted

## 2019-03-18 DIAGNOSIS — M25561 Pain in right knee: Secondary | ICD-10-CM | POA: Diagnosis not present

## 2019-03-18 NOTE — Telephone Encounter (Signed)
   [facility] Pre-operative Risk Assessment    Request for surgical clearance:  1. What type of surgery is being performed? RIGHT KNEE ARTHROPLASTY   2. When is this surgery scheduled? TBD   3. What type of clearance is required (medical clearance vs. Pharmacy clearance to hold med vs. Both)? MEDICAL  4. Are there any medications that need to be held prior to surgery and how long? ELIQUIS   5. Practice name and name of physician performing surgery? [facility]; DR. Collier Salina DALLFORF   6. What is your office phone number [phone]   7.   What is your office fax number [phone]  8.   Anesthesia type (None, local, MAC, general) ? SPINAL   Julaine Hua 03/18/2019, 3:36 PM  _________________________________________________________________   (provider comments below)

## 2019-03-21 NOTE — Telephone Encounter (Signed)
   Primary Cardiologist: No primary care provider on file.  Electrophysiologist: Dr Lovena Le  Chart reviewed as part of pre-operative protocol coverage. Patient was contacted 03/21/2019 in reference to pre-operative risk assessment for pending surgery as outlined below.  Scott [name] was last seen on 03/08/2019 by Dr. [name] for OSA and on 11/15/2018 by [name], PA for HF and afib. Since that day, Scott [name] has done well. He has a history of afib s/p ablation, CABG and Maze procedure. EF 45-50%. Volume status is stable. He walks about 15,000-20,000 steps per day, slowly due to knee pain. He can easily complete >4 METS of activity without exertional symptoms. He occ needs rescue inhaler when it is cold outside.   Therefore, based on ACC/AHA guidelines, the patient would be at acceptable risk for the planned procedure without further cardiovascular testing.   Per office protocol, patient can hold Eliquis for 3 days prior to procedure.    Patient will not need bridging with Lovenox (enoxaparin) around procedure.  For orthopedic procedures please be sure to resume therapeutic (not prophylactic) dosing.   I will route this recommendation to the requesting party via Epic fax function and remove from pre-op pool.  Please call with questions.  [name], NP 03/21/2019, 1:04 PM

## 2019-03-21 NOTE — Telephone Encounter (Signed)
I called pt to update clinical status. If no new symptoms, can clear for surgery. No answer. I left VM to call back and ask for preop clinic.

## 2019-03-21 NOTE — Telephone Encounter (Signed)
Patient with diagnosis of atrial fibrillation on Eliquis for anticoagulation.    Procedure: right knee arthroplasty Date of procedure: TBD  CHADS2-VASc score of  3 (CHF, HTN, CAD)  CrCl 66.4 Platelet count 216  Per office protocol, patient can hold Eliquis for 3 days prior to procedure.    Patient will not need bridging with Lovenox (enoxaparin) around procedure.  For orthopedic procedures please be sure to resume therapeutic (not prophylactic) dosing.

## 2019-03-23 ENCOUNTER — Telehealth: Payer: Self-pay

## 2019-03-23 ENCOUNTER — Other Ambulatory Visit: Payer: Self-pay | Admitting: Internal Medicine

## 2019-03-23 ENCOUNTER — Other Ambulatory Visit: Payer: Self-pay | Admitting: Orthopaedic Surgery

## 2019-03-23 NOTE — Telephone Encounter (Signed)
Surgical Clearance form from [employer] faxed 03/17/19. Rebecca L. Direct ph [phone]. Please advise if we received and completed form.

## 2019-03-23 NOTE — Telephone Encounter (Signed)
Unable to locate form.  Called Scott [name] left a message for her to refax form for completion for surgery.

## 2019-03-29 ENCOUNTER — Encounter (HOSPITAL_COMMUNITY): Payer: Self-pay

## 2019-03-29 DIAGNOSIS — G4733 Obstructive sleep apnea (adult) (pediatric): Secondary | ICD-10-CM | POA: Diagnosis not present

## 2019-03-29 NOTE — Patient Instructions (Addendum)
DUE TO COVID-19 ONLY ONE VISITOR IS ALLOWED TO COME WITH YOU AND STAY IN THE WAITING ROOM ONLY DURING PRE OP AND PROCEDURE. THE ONE VISITOR MAY VISIT WITH YOU IN YOUR PRIVATE ROOM DURING VISITING HOURS ONLY!!   COVID SWAB TESTING MUST BE COMPLETED ON:  Friday, Feb. 5, 2021 at  1:00 PM   [address] [place] [facility] enter pre surgical testing line (Must self quarantine after testing. Follow instructions on handout.)             Your procedure is scheduled on: Tuesday, Feb. 9, 2021   Report to [facility] Main  Entrance   Report to Short Stay at 5:30 AM   Call this number if you have problems the morning of surgery [phone]    Bring CPAP mask and tubing day of surgery   Do not eat food :After Midnight.   May have liquids until 4:30 AM day of surgery   CLEAR LIQUID DIET  Foods Allowed                                                                     Foods Excluded  Water, Black Coffee and tea, regular and decaf                             liquids that you cannot  Plain Jell-O in any flavor  (No red)                                           see through such as: Fruit ices (not with fruit pulp)                                     milk, soups, orange juice  Iced Popsicles (No red)                                    All solid food Carbonated beverages, regular and diet                                    Apple juices Sports drinks like Gatorade (No red) Lightly seasoned clear broth or consume(fat free) Sugar, honey syrup  Sample Menu Breakfast                                Lunch                                     Supper Cranberry juice                    Beef broth  Chicken broth Jell-O                                     Grape juice                           Apple juice Coffee or tea                        Jell-O                                      Popsicle  Coffee or tea                        Coffee or tea   Complete one Ensure drink the morning of surgery at 4:30 AM the day of surgery.  Oral Hygiene is also important to reduce your risk of infection.                                    Remember - BRUSH YOUR TEETH THE MORNING OF SURGERY WITH YOUR REGULAR TOOTHPASTE   Do NOT smoke after Midnight   Take these medicines the morning of surgery with A SIP OF WATER: Gabapentin, Metoprolol, Alprazolam if needed   Use Inhaler morning of surgery   Bring rescue inhaler day of surgery                               You may not have any metal on your body including jewelry, and body piercings             Do not wear lotions, powders, perfumes/cologne, or deodorant                          Men may shave face and neck.   Do not bring valuables to the hospital. [employer].   Contacts, dentures or bridgework may not be worn into surgery.   Bring small overnight bag day of surgery.    Special Instructions: Bring a copy of your healthcare power of attorney and living will documents         the day of surgery if you haven't scanned them in before.              Please read over the following fact sheets you were given:  [facility] - Preparing for Surgery Before surgery, you can play an important role.  Because skin is not sterile, your skin needs to be as free of germs as possible.  You can reduce the number of germs on your skin by washing with CHG (chlorahexidine gluconate) soap before surgery.  CHG is an antiseptic cleaner which kills germs and bonds with the skin to continue killing germs even after washing. Please DO NOT use if you have an allergy to CHG or antibacterial soaps.  If your skin becomes reddened/irritated stop using the CHG and inform your nurse when you arrive at Short Stay. Do not shave (including legs  and underarms) for at least 48 hours prior to the first CHG shower.  You may shave your  face/neck.  Please follow these instructions carefully:  1.  Shower with CHG Soap the night before surgery and the  morning of surgery.  2.  If you choose to wash your hair, wash your hair first as usual with your normal  shampoo.  3.  After you shampoo, rinse your hair and body thoroughly to remove the shampoo.                             4.  Use CHG as you would any other liquid soap.  You can apply chg directly to the skin and wash.  Gently with a scrungie or clean washcloth.  5.  Apply the CHG Soap to your body ONLY FROM THE NECK DOWN.   Do   not use on face/ open                           Wound or open sores. Avoid contact with eyes, ears mouth and   genitals (private parts).                       Wash face,  Genitals (private parts) with your normal soap.             6.  Wash thoroughly, paying special attention to the area where your    surgery  will be performed.  7.  Thoroughly rinse your body with warm water from the neck down.  8.  DO NOT shower/wash with your normal soap after using and rinsing off the CHG Soap.                9.  Pat yourself dry with a clean towel.            10.  Wear clean pajamas.            11.  Place clean sheets on your bed the night of your first shower and do not  sleep with pets. Day of Surgery : Do not apply any lotions/deodorants the morning of surgery.  Please wear clean clothes to the hospital/surgery center.  FAILURE TO FOLLOW THESE INSTRUCTIONS MAY RESULT IN THE CANCELLATION OF YOUR SURGERY  PATIENT SIGNATURE_________________________________  NURSE SIGNATURE__________________________________  ________________________________________________________________________   Adam Phenix  An incentive spirometer is a tool that can help keep your lungs clear and active. This tool measures how well you are filling your lungs with each breath. Taking long deep breaths may help reverse or decrease the chance of developing breathing (pulmonary)  problems (especially infection) following:  A long period of time when you are unable to move or be active. BEFORE THE PROCEDURE   If the spirometer includes an indicator to show your best effort, your nurse or respiratory therapist will set it to a desired goal.  If possible, sit up straight or lean slightly forward. Try not to slouch.  Hold the incentive spirometer in an upright position. INSTRUCTIONS FOR USE  1. Sit on the edge of your bed if possible, or sit up as far as you can in bed or on a chair. 2. Hold the incentive spirometer in an upright position. 3. Breathe out normally. 4. Place the mouthpiece in your mouth and seal your lips tightly around it. 5. Breathe in slowly and as deeply as possible,  raising the piston or the ball toward the top of the column. 6. Hold your breath for 3-5 seconds or for as long as possible. Allow the piston or ball to fall to the bottom of the column. 7. Remove the mouthpiece from your mouth and breathe out normally. 8. Rest for a few seconds and repeat Steps 1 through 7 at least 10 times every 1-2 hours when you are awake. Take your time and take a few normal breaths between deep breaths. 9. The spirometer may include an indicator to show your best effort. Use the indicator as a goal to work toward during each repetition. 10. After each set of 10 deep breaths, practice coughing to be sure your lungs are clear. If you have an incision (the cut made at the time of surgery), support your incision when coughing by placing a pillow or rolled up towels firmly against it. Once you are able to get out of bed, walk around indoors and cough well. You may stop using the incentive spirometer when instructed by your caregiver.  RISKS AND COMPLICATIONS  Take your time so you do not get dizzy or light-headed.  If you are in pain, you may need to take or ask for pain medication before doing incentive spirometry. It is harder to take a deep breath if you are having  pain. AFTER USE  Rest and breathe slowly and easily.  It can be helpful to keep track of a log of your progress. Your caregiver can provide you with a simple table to help with this. If you are using the spirometer at home, follow these instructions: [place] IF:   You are having difficultly using the spirometer.  You have trouble using the spirometer as often as instructed.  Your pain medication is not giving enough relief while using the spirometer.  You develop fever of 100.5 F (38.1 C) or higher. SEEK IMMEDIATE MEDICAL CARE IF:   You cough up bloody sputum that had not been present before.  You develop fever of 102 F (38.9 C) or greater.  You develop worsening pain at or near the incision site. MAKE SURE YOU:   Understand these instructions.  Will watch your condition.  Will get help right away if you are not doing well or get worse. Document Released: 06/23/2006 Document Revised: 05/05/2011 Document Reviewed: 08/24/2006 ExitCare Patient Information 2014 ExitCare, [place].   ________________________________________________________________________  WHAT IS A BLOOD TRANSFUSION? Blood Transfusion Information  A transfusion is the replacement of blood or some of its parts. Blood is made up of multiple cells which provide different functions.  Red blood cells carry oxygen and are used for blood loss replacement.  White blood cells fight against infection.  Platelets control bleeding.  Plasma helps clot blood.  Other blood products are available for specialized needs, such as hemophilia or other clotting disorders. BEFORE THE TRANSFUSION  Who gives blood for transfusions?   Healthy volunteers who are fully evaluated to make sure their blood is safe. This is blood bank blood. Transfusion therapy is the safest it has ever been in the practice of medicine. Before blood is taken from a donor, a complete history is taken to make sure that person has no history  of diseases nor engages in risky social behavior (examples are intravenous drug use or sexual activity with multiple partners). The donor's travel history is screened to minimize risk of transmitting infections, such as malaria. The donated blood is tested for signs of infectious diseases, such as  HIV and hepatitis. The blood is then tested to be sure it is compatible with you in order to minimize the chance of a transfusion reaction. If you or a relative donates blood, this is often done in anticipation of surgery and is not appropriate for emergency situations. It takes many days to process the donated blood. RISKS AND COMPLICATIONS Although transfusion therapy is very safe and saves many lives, the main dangers of transfusion include:   Getting an infectious disease.  Developing a transfusion reaction. This is an allergic reaction to something in the blood you were given. Every precaution is taken to prevent this. The decision to have a blood transfusion has been considered carefully by your caregiver before blood is given. Blood is not given unless the benefits outweigh the risks. AFTER THE TRANSFUSION  Right after receiving a blood transfusion, you will usually feel much better and more energetic. This is especially true if your red blood cells have gotten low (anemic). The transfusion raises the level of the red blood cells which carry oxygen, and this usually causes an energy increase.  The nurse administering the transfusion will monitor you carefully for complications. HOME CARE INSTRUCTIONS  No special instructions are needed after a transfusion. You may find your energy is better. Speak with your caregiver about any limitations on activity for underlying diseases you may have. SEEK MEDICAL CARE IF:   Your condition is not improving after your transfusion.  You develop redness or irritation at the intravenous (IV) site. SEEK IMMEDIATE MEDICAL CARE IF:  Any of the following symptoms  occur over the next 12 hours:  Shaking chills.  You have a temperature by mouth above 102 F (38.9 C), not controlled by medicine.  Chest, back, or muscle pain.  People around you feel you are not acting correctly or are confused.  Shortness of breath or difficulty breathing.  Dizziness and fainting.  You get a rash or develop hives.  You have a decrease in urine output.  Your urine turns a dark color or changes to pink, red, or brown. Any of the following symptoms occur over the next 10 days:  You have a temperature by mouth above 102 F (38.9 C), not controlled by medicine.  Shortness of breath.  Weakness after normal activity.  The white part of the eye turns yellow (jaundice).  You have a decrease in the amount of urine or are urinating less often.  Your urine turns a dark color or changes to pink, red, or brown. Document Released: 02/08/2000 Document Revised: 05/05/2011 Document Reviewed: 09/27/2007 [facility] Patient Information 2014 [place], [place].  _______________________________________________________________________

## 2019-03-30 NOTE — Care Plan (Signed)
Ortho Bundle Case Management Note  Patient Details  Name: Scott [name] MRN: [id number] Date of Birth: [birth year]  Spoke with patient. He plans to discharge to home with family. Has equipment at home. HHPT referral to [facility]. OPPT set up with [facility] on [facility]. Patient and MD in agreement with plan. Choice offered.              DME Arranged:    DME Agency:     HH Arranged:  PT HH Agency:  [facility] (formerly [facility])  Additional Comments: Please contact me with any questions of if this plan should need to change.  Ladell Heads,  [place] [facility]  [phone] 03/30/2019, 11:56 AM

## 2019-03-31 ENCOUNTER — Other Ambulatory Visit: Payer: Self-pay

## 2019-03-31 ENCOUNTER — Encounter (HOSPITAL_COMMUNITY)
Admission: RE | Admit: 2019-03-31 | Discharge: 2019-03-31 | Disposition: A | Discharge status: Home / Self Care | Payer: PPO | Source: Ambulatory Visit | Attending: Orthopaedic Surgery | Admitting: Orthopaedic Surgery

## 2019-03-31 ENCOUNTER — Encounter (HOSPITAL_COMMUNITY): Payer: Self-pay

## 2019-03-31 DIAGNOSIS — Z01810 Encounter for preprocedural cardiovascular examination: Secondary | ICD-10-CM | POA: Insufficient documentation

## 2019-03-31 DIAGNOSIS — Z01812 Encounter for preprocedural laboratory examination: Secondary | ICD-10-CM | POA: Insufficient documentation

## 2019-03-31 DIAGNOSIS — Z01818 Encounter for other preprocedural examination: Secondary | ICD-10-CM

## 2019-03-31 DIAGNOSIS — G4733 Obstructive sleep apnea (adult) (pediatric): Secondary | ICD-10-CM | POA: Diagnosis not present

## 2019-03-31 HISTORY — DX: Chronic kidney disease, stage 3 unspecified: N18.30

## 2019-03-31 HISTORY — DX: Obesity, unspecified: E66.9

## 2019-03-31 HISTORY — DX: Hyperglycemia, unspecified: R73.9

## 2019-03-31 LAB — CBC WITH DIFFERENTIAL/PLATELET
Abs Immature Granulocytes: 0.02 10*3/uL (ref 0.00–0.07)
Basophils Absolute: 0 10*3/uL (ref 0.0–0.1)
Basophils Relative: 1 %
Eosinophils Absolute: 0.3 10*3/uL (ref 0.0–0.5)
Eosinophils Relative: 4 %
HCT: 44.4 % (ref 39.0–52.0)
Hemoglobin: 14.9 g/dL (ref 13.0–17.0)
Immature Granulocytes: 0 %
Lymphocytes Relative: 17 %
Lymphs Abs: 1.4 10*3/uL (ref 0.7–4.0)
MCH: 32.3 pg (ref 26.0–34.0)
MCHC: 33.6 g/dL (ref 30.0–36.0)
MCV: 96.3 fL (ref 80.0–100.0)
Monocytes Absolute: 0.8 10*3/uL (ref 0.1–1.0)
Monocytes Relative: 10 %
Neutro Abs: 5.4 10*3/uL (ref 1.7–7.7)
Neutrophils Relative %: 68 %
Platelets: 201 10*3/uL (ref 150–400)
RBC: 4.61 MIL/uL (ref 4.22–5.81)
RDW: 12.6 % (ref 11.5–15.5)
WBC: 7.9 10*3/uL (ref 4.0–10.5)
nRBC: 0 % (ref 0.0–0.2)

## 2019-03-31 LAB — URINALYSIS, ROUTINE W REFLEX MICROSCOPIC
Bilirubin Urine: NEGATIVE
Glucose, UA: NEGATIVE mg/dL
Hgb urine dipstick: NEGATIVE
Ketones, ur: NEGATIVE mg/dL
Leukocytes,Ua: NEGATIVE
Nitrite: NEGATIVE
Protein, ur: NEGATIVE mg/dL
Specific Gravity, Urine: 1.021 (ref 1.005–1.030)
pH: 7 (ref 5.0–8.0)

## 2019-03-31 LAB — BASIC METABOLIC PANEL
Anion gap: 12 (ref 5–15)
BUN: 30 mg/dL — ABNORMAL HIGH (ref 8–23)
CO2: 27 mmol/L (ref 22–32)
Calcium: 8.9 mg/dL (ref 8.9–10.3)
Chloride: 105 mmol/L (ref 98–111)
Creatinine, Ser: 1.38 mg/dL — ABNORMAL HIGH (ref 0.61–1.24)
GFR calc Af Amer: >60 mL/min (ref >60)
GFR calc non Af Amer: 54 mL/min — ABNORMAL LOW (ref >60)
Glucose, Bld: 101 mg/dL — ABNORMAL HIGH (ref 70–99)
Potassium: 3.7 mmol/L (ref 3.5–5.1)
Sodium: 144 mmol/L (ref 135–145)

## 2019-03-31 LAB — APTT: aPTT: 30 seconds (ref 24–36)

## 2019-03-31 LAB — SURGICAL PCR SCREEN
MRSA, PCR: NEGATIVE
Staphylococcus aureus: NEGATIVE

## 2019-03-31 LAB — PROTIME-INR
INR: 1 (ref 0.8–1.2)
Prothrombin Time: 13.2 seconds (ref 11.4–15.2)

## 2019-03-31 LAB — ABO/RH: ABO/RH(D): B NEG

## 2019-03-31 NOTE — Progress Notes (Signed)
PCP - Dr. [name] last office visit 03/14/19 in epic Cardiologist - Dr. [name] and Dr. Lovena Le. Cardiac clearance from Dr. [name] last office visit 03/08/19 in epic,  Cardiac clearance J. Phylliss Bob 03/18/19 in epic  Chest x-ray - 09/06/18 in epic EKG - 10/12/18 in epic Stress Test - 03/06/17 in epic ECHO - 09/13/18 in epic Cardiac Cath - N/A  Sleep Study - 10/2018 in epic CPAP - Yes  Fasting Blood Sugar - N/A Checks Blood Sugar _N/A____ times a day  Blood Thinner Instructions: Eliquis per note stop 3 days prior to surgery, Patient was unaware instructed him to contact Drs. Office for clarification Aspirin Instructions: N/A Last Dose: N/A  Anesthesia review: A. Fib, CAD, COPD, CHF, CABG, HTN, OSA, Diastolic Dysfunction  Patient denies shortness of breath, fever, cough and chest pain at PAT appointment   Patient verbalized understanding of instructions that were given to them at the PAT appointment. Patient was also instructed that they will need to review over the PAT instructions again at home before surgery.

## 2019-04-01 ENCOUNTER — Other Ambulatory Visit (HOSPITAL_COMMUNITY)
Admission: RE | Admit: 2019-04-01 | Discharge: 2019-04-01 | Disposition: A | Discharge status: Home / Self Care | Payer: PPO | Source: Ambulatory Visit | Attending: Orthopaedic Surgery | Admitting: Orthopaedic Surgery

## 2019-04-01 DIAGNOSIS — Z20822 Contact with and (suspected) exposure to covid-19: Secondary | ICD-10-CM | POA: Insufficient documentation

## 2019-04-01 DIAGNOSIS — Z01812 Encounter for preprocedural laboratory examination: Secondary | ICD-10-CM | POA: Insufficient documentation

## 2019-04-01 LAB — SARS CORONAVIRUS 2 (TAT 6-24 HRS): SARS Coronavirus 2: NEGATIVE

## 2019-04-01 NOTE — Progress Notes (Signed)
Anesthesia Chart Review   Case: [id number] Date/Time: 04/05/19 0715   Procedure: RIGHT TOTAL KNEE ARTHROPLASTY (Right Knee)   Anesthesia type: Spinal   Pre-op diagnosis: RIGHT KNEE DEGENERATIVE JOINT DISEASE   Location: [facility] ROOM 09 / [facility]   Surgeons: Melrose Nakayama, MD      DISCUSSION:63 y.o. former smoker (39 pack years, quit 04/06/95) with h/o HLD, HTN, atrial fibrillation (on Eliquis, s/p MAZE 2013), CAD (CABG 2013), COPD, CKD Stage III, CHF, OSA, right knee DJD scheduled for above procedure 04/05/19 with Dr. Melrose Nakayama.   Cleared by cardiology.  Per [name], NP, "Patient was contacted 03/21/2019 in reference to pre-operative risk assessment for pending surgery as outlined below.  Scott [name] was last seen on 03/08/2019 by Dr. [name] for OSA and on 11/15/2018 by [name], PA for HF and afib. Since that day, Scott [name] has done well. He has a history of afib s/p ablation, CABG and Maze procedure. EF 45-50%. Volume status is stable. He walks about 15,000-20,000 steps per day, slowly due to knee pain. He can easily complete >4 METS of activity without exertional symptoms. He occ needs rescue inhaler when it is cold outside.  Therefore, based on ACC/AHA guidelines, the patient would be at acceptable risk for the planned procedure without further cardiovascular testing.  Per office protocol, patient can holdEliquisfor 3days prior to procedure.  Patient will not need bridging with Lovenox (enoxaparin) around procedure For orthopedic procedures please be sure to resume therapeutic (not prophylactic) dosing."  Last seen by PCP, Dr. Cathlean Cower, 03/14/19.  Stable at this visit.   Anticipate pt can proceed with planned procedure barring acute status change.   VS: BP (!) 144/76 (BP Location: Right Arm)   Pulse 67   Temp 36.6 C (Oral)   Resp 18   Ht 5\' 6"  (1.676 m)   Wt 93.5 kg   SpO2 97%   BMI 33.27 kg/m   PROVIDERS: [name], MD is PCP last seen  03/14/2019  Cristopher Peru, MD is EP  Fransico Him, MD is Cardiologist  LABS: Labs reviewed: Acceptable for surgery. (all labs ordered are listed, but only abnormal results are displayed)  Labs Reviewed  BASIC METABOLIC PANEL - Abnormal; Notable for the following components:      Result Value   Glucose, Bld 101 (*)    BUN 30 (*)    Creatinine, Ser 1.38 (*)    GFR calc non Af Amer 54 (*)    All other components within normal limits  SURGICAL PCR SCREEN  APTT  CBC WITH DIFFERENTIAL/PLATELET  PROTIME-INR  URINALYSIS, ROUTINE W REFLEX MICROSCOPIC  TYPE AND SCREEN  ABO/RH     IMAGES:   EKG: 10/11/2018 Rate 78bpm Sinus rhythm with 1st degree AV block   CV: Echo 09/13/2018 IMPRESSIONS   1. The left ventricle has mildly reduced systolic function, with an  ejection fraction of 45-50%. The cavity size was normal. There is mildly  increased left ventricular wall thickness. Diastolic function not assessed  - images not acquired.. There is  abnormal septal motion consistent with post-operative status.  2. The right ventricle has moderately reduced systolic function. The  cavity was normal. There is no increase in right ventricular wall  thickness. Right ventricular systolic pressure could not be assessed.  3. The aortic valve is tricuspid. Aortic valve regurgitation is trivial  by color flow Doppler. No stenosis of the aortic valve.  4. Left atrial size was mildly dilated.  5. When  compared to the prior study: 09/2011 - images not available for  side by side comparison.   Myocardial Perfusion 03/06/2017  Nuclear stress EF: 47%.  This is a low risk study.  The left ventricular ejection fraction is mildly decreased (45-54%).  There was no ST segment deviation noted during stress.  No T wave inversion was noted during stress.   Low risk stress nuclear study with normal perfusion and mildly reduced left ventricular systolic function. Past Medical History:  Diagnosis  Date  . ALLERGIC RHINITIS 01/14/2007  . ANXIETY 09/28/2006  . Asbestos exposure   . Atrial fibrillation ([place]) 09/28/2006   s/p afib ablation x 2  . CHF (congestive heart failure) ([place])   . CKD (chronic kidney disease), stage III    patient denies  . COPD (chronic obstructive pulmonary disease) ([place])   . Coronary artery disease 11/24/2011  . Diastolic dysfunction 99991111  . Dyspnea    W/ EXERTION   . Dysrhythmia    AFIB     . First degree AV block 10/12/2018   Noted on EKG  . Gallstones   . Hyperglycemia   . HYPERLIPIDEMIA 09/28/2006  . HYPERTENSION 09/28/2006  . HYPOTHYROIDISM, ACQUIRED NEC 09/28/2006   pt denies and doesn't know  . Long term current use of anticoagulant 04/08/2010  . Obese   . OBSTRUCTIVE SLEEP APNEA 01/06/2008   NOT USING    . Right knee DJD 07/09/2010  . S/P CABG x 1 11/26/2011   Right internal mammary artery to right coronary artery  . S/P Maze operation for atrial fibrillation 11/26/2011   complete biatrial lesion set with clipping of LA appendage    Past Surgical History:  Procedure Laterality Date  . ANTERIOR CERVICAL DECOMP/DISCECTOMY FUSION N/A 06/20/2016   Procedure: Anterior Cervical Discectomy Fusion - Cervical five-Cervical six - Cervical six-Cervical seven - Cervicla seven- Thoracic one;  Surgeon: Earnie Larsson, MD;  Location: [place];  Service: Neurosurgery;  Laterality: N/A;  . CARDIOVERSION     X4   . CORONARY ARTERY BYPASS GRAFT  11/26/2011   Procedure: CORONARY ARTERY BYPASS GRAFTING (CABG);  Surgeon: Rexene Alberts, MD;  Location: [place];  Service: Open Heart Surgery;  Laterality: N/A;  coronary artery bypass on pump times one utilizing the right internal mammary artery, transesophageal echocardiogram   . HAND SURGERY Left   . LEFT HEART CATHETERIZATION WITH CORONARY ANGIOGRAM N/A 11/24/2011   Procedure: LEFT HEART CATHETERIZATION WITH CORONARY ANGIOGRAM;  Surgeon: Peter M Martinique, MD;  Location: [facility] CATH LAB;  Service: Cardiovascular;  Laterality: N/A;  .  LOOP RECORDER IMPLANT N/A 07/06/2012   Procedure: LOOP RECORDER IMPLANT;  Surgeon: Thompson Grayer, MD;  Location: [facility] CATH LAB;  Service: Cardiovascular;  Laterality: N/A;  . MAZE  11/26/2011   Procedure: MAZE;  Surgeon: Rexene Alberts, MD;  Location: [place];  Service: Open Heart Surgery;  Laterality: N/A;  [place]   . Radiofrequency Ablation for atrial fibrillation  12/30/07 and 06/05/09   afib ablation x 2 by JA  . Radiofrequency ablation for atrial flutter  2005   [facility]I    MEDICATIONS: . acetaminophen (TYLENOL) 500 MG tablet  . ALPRAZolam (XANAX) 0.5 MG tablet  . budesonide-formoterol (SYMBICORT) 160-4.5 MCG/ACT inhaler  . cholecalciferol (VITAMIN D) 1000 units tablet  . docusate sodium (COLACE) 100 MG capsule  . ELIQUIS 5 MG TABS tablet  . furosemide (LASIX) 20 MG tablet  . gabapentin (NEURONTIN) 100 MG capsule  . lovastatin (MEVACOR) 40 MG tablet  . metoprolol succinate (  TOPROL-XL) 50 MG 24 hr tablet  . Multiple Vitamin (MULTIVITAMIN WITH MINERALS) TABS tablet  . Olmesartan-amLODIPine-HCTZ 40-5-12.5 MG TABS  . PROAIR HFA 108 (90 Base) MCG/ACT inhaler  . spironolactone (ALDACTONE) 25 MG tablet  . temazepam (RESTORIL) 30 MG capsule  . traMADol (ULTRAM) 50 MG tablet   No current facility-administered medications for this encounter.     Maia Plan [facility] Pre-Surgical Testing [phone] 04/01/19  12:29 PM

## 2019-04-01 NOTE — Anesthesia Preprocedure Evaluation (Addendum)
Anesthesia Evaluation  Patient identified by MRN, date of birth, ID band Patient awake    Reviewed: Allergy & Precautions, NPO status , Patient's Chart, lab work & pertinent test results  History of Anesthesia Complications Negative for: history of anesthetic complications  Airway Mallampati: II  TM Distance: >3 FB Neck ROM: Full    Dental   Pulmonary sleep apnea , COPD,  COPD inhaler, former smoker,    Pulmonary exam normal        Cardiovascular hypertension, Pt. on medications and Pt. on home beta blockers + CAD, + CABG (2013) and +CHF  Normal cardiovascular exam+ dysrhythmias (s/p MAZE 2013; on Eliquis) Atrial Fibrillation      Neuro/Psych PSYCHIATRIC DISORDERS Anxiety negative neurological ROS     GI/Hepatic negative GI ROS, Neg liver ROS,   Endo/Other  negative endocrine ROS  Renal/GU Renal InsufficiencyRenal disease (CKDIII)  negative genitourinary   Musculoskeletal negative musculoskeletal ROS (+)   Abdominal   Peds  Hematology negative hematology ROS (+)   Anesthesia Other Findings Cleared by cardiology.  Per Scott Perch, NP, "Patient was contacted1/25/2021in reference to pre-operative risk assessment for pending surgery as outlined below. Scott [name] last seen on 1/12/2021by Dr. Radford [name] for OSA and on 11/15/2018 by Scott Kilts, PA for HF and afib. Since that day,Scott [name] done well.He has a history of afib s/p ablation, CABG and Maze procedure. EF 45-50%. Volume status is stable. He walks about 15,000-20,000 steps per day, slowly due to knee pain. He can easily complete >4 METS of activity without exertional symptoms. He occ needs rescue inhaler when it is cold outside. Therefore, based on ACC/AHA guidelines, the patient would be at acceptable risk for the planned procedure without further cardiovascular testing. Per office protocol, patient can holdEliquisfor 3days prior to  procedure.  Patient will not need bridging with Lovenox (enoxaparin) around procedure For orthopedic procedures please be sure to resume therapeutic (not prophylactic) dosing."   Echo 09/13/18: EF [id number], mod RV systolic dysfunction Low risk MPS 03/06/17 - normal perfusion, EF 47%  Reproductive/Obstetrics                           Anesthesia Physical Anesthesia Plan  ASA: III  Anesthesia Plan: Spinal   Post-op Pain Management:    Induction:   PONV Risk Score and Plan: 1 and Propofol infusion, Treatment may vary due to age or medical condition, Ondansetron and TIVA  Airway Management Planned: Nasal Cannula and Simple Face Mask  Additional Equipment: None  Intra-op Plan:   Post-operative Plan:   Informed Consent: I have reviewed the patients History and Physical, chart, labs and discussed the procedure including the risks, benefits and alternatives for the proposed anesthesia with the patient or authorized representative who has indicated his/her understanding and acceptance.       Plan Discussed with:   Anesthesia Plan Comments: (Eliquis held >72 hours -- OK for spinal)      Anesthesia Quick Evaluation

## 2019-04-04 MED ORDER — TRANEXAMIC ACID 1000 MG/10ML IV SOLN
2000.0000 mg | INTRAVENOUS | Status: DC
  Filled 2019-04-04: qty 20

## 2019-04-04 MED ORDER — BUPIVACAINE LIPOSOME 1.3 % IJ SUSP
20.0000 mL | Freq: Once | INTRAMUSCULAR | Status: DC
  Filled 2019-04-04: qty 20

## 2019-04-04 NOTE — H&P (Signed)
TOTAL KNEE ADMISSION H&P  Patient is being admitted for right total knee arthroplasty.  Subjective:  Chief Complaint:right knee pain.  HPI: Scott [name]., 64 y.o. male, has a history of pain and functional disability in the right knee due to arthritis and has failed non-surgical conservative treatments for greater than 12 weeks to includeNSAID's and/or analgesics, corticosteriod injections, viscosupplementation injections, flexibility and strengthening excercises, use of assistive devices, weight reduction as appropriate and activity modification.  Onset of symptoms was gradual, starting 5 years ago with gradually worsening course since that time. The patient noted no past surgery on the right knee(s).  Patient currently rates pain in the right knee(s) at 10 out of 10 with activity. Patient has night pain, worsening of pain with activity and weight bearing, pain that interferes with activities of daily living, crepitus and joint swelling.  Patient has evidence of subchondral cysts, subchondral sclerosis, periarticular osteophytes and joint space narrowing by imaging studies. There is no active infection.  Patient Active Problem List   Diagnosis Date Noted  . CKD (chronic kidney disease) stage 3, GFR 30-59 ml/min 03/14/2019  . Degenerative joint disease of knee, right 09/27/2018  . Dyspnea 09/06/2018  . Right knee pain 09/06/2018  . Radiculitis of left cervical region 03/05/2018  . Plantar fasciitis, left 08/19/2017  . Chest pain 02/27/2017  . Right ear pain 02/27/2017  . Left groin pain 09/13/2016  . Rib pain on left side 09/13/2016  . Left leg pain 09/13/2016  . Stenosis of cervical spine with myelopathy ([place]) 06/20/2016  . Special screening for malignant neoplasms, colon 04/04/2016  . Cough 01/11/2016  . COPD exacerbation ([place]) 12/22/2015  . Rectal bleeding 08/14/2014  . Intercostal muscle strain 08/07/2014  . Chronic meniscal tear of knee 04/06/2014  . Heel bone fracture  04/06/2014  . Hyperglycemia 04/04/2014  . Recurrent falls 04/04/2014  . Bilateral knee pain 04/04/2014  . Intractable left heel pain 04/04/2014  . Insomnia 04/04/2014  . Hematochezia 04/04/2014  . Bladder neck obstruction 12/19/2013  . Encounter for therapeutic drug monitoring 03/30/2013  . Palpitations 07/06/2012  . Shortness of breath 01/19/2012  . Sick sinus syndrome ([place]) 12/01/2011  . S/P CABG x 1 11/26/2011  . S/P Maze operation for atrial fibrillation 11/26/2011  . CAD (coronary artery disease) 11/25/2011  . Paroxysmal atrial fibrillation ([place]) 11/24/2011  . Chronic combined systolic (congestive) and diastolic (congestive) heart failure ([place]) 11/24/2011  . Gross hematuria 12/12/2010  . Left knee pain 12/12/2010  . Left knee DJD 07/09/2010  . Preventative health care 07/07/2010  . Chronic anticoagulation 04/08/2010  . COPD (chronic obstructive pulmonary disease) ([place]) 10/04/2008  . OBSTRUCTIVE SLEEP APNEA 01/06/2008  . ALLERGIC RHINITIS 01/14/2007  . Hypothyroidism 09/28/2006  . Hyperlipidemia 09/28/2006  . Anxiety state 09/28/2006  . Essential hypertension 09/28/2006   Past Medical History:  Diagnosis Date  . ALLERGIC RHINITIS 01/14/2007  . ANXIETY 09/28/2006  . Asbestos exposure   . Atrial fibrillation ([place]) 09/28/2006   s/p afib ablation x 2  . CHF (congestive heart failure) ([place])   . CKD (chronic kidney disease), stage III    patient denies  . COPD (chronic obstructive pulmonary disease) ([place])   . Coronary artery disease 11/24/2011  . Diastolic dysfunction [id number]  . Dyspnea    W/ EXERTION   . Dysrhythmia    AFIB     . First degree AV block 10/12/2018   Noted on EKG  . Gallstones   . Hyperglycemia   . HYPERLIPIDEMIA 09/28/2006  .  HYPERTENSION 09/28/2006  . HYPOTHYROIDISM, ACQUIRED NEC 09/28/2006   pt denies and doesn't know  . Long term current use of anticoagulant 04/08/2010  . Obese   . OBSTRUCTIVE SLEEP APNEA 01/06/2008   NOT USING    . Right knee DJD  07/09/2010  . S/P CABG x 1 11/26/2011   Right internal mammary artery to right coronary artery  . S/P Maze operation for atrial fibrillation 11/26/2011   complete biatrial lesion set with clipping of LA appendage    Past Surgical History:  Procedure Laterality Date  . ANTERIOR CERVICAL DECOMP/DISCECTOMY FUSION N/A 06/20/2016   Procedure: Anterior Cervical Discectomy Fusion - Cervical five-Cervical six - Cervical six-Cervical seven - Cervicla seven- Thoracic one;  Surgeon: Earnie Larsson, MD;  Location: [facility];  Service: Neurosurgery;  Laterality: N/A;  . CARDIOVERSION     X4   . CORONARY ARTERY BYPASS GRAFT  11/26/2011   Procedure: CORONARY ARTERY BYPASS GRAFTING (CABG);  Surgeon: Rexene Alberts, MD;  Location: [facility];  Service: Open Heart Surgery;  Laterality: N/A;  coronary artery bypass on pump times one utilizing the right internal mammary artery, transesophageal echocardiogram   . HAND SURGERY Left   . LEFT HEART CATHETERIZATION WITH CORONARY ANGIOGRAM N/A 11/24/2011   Procedure: LEFT HEART CATHETERIZATION WITH CORONARY ANGIOGRAM;  Surgeon: Peter M Martinique, MD;  [facility] C[facility] LAB;  Service: Cardiovascular;  Laterality: N/A;  . LOOP RECORDER IMPLANT N/A 07/06/2012   Procedure: LOOP RECORDER IMPLANT;  Surgeon: Thompson Grayer, MD;  Location: [facility] CATH LAB;  Service: Cardiovascular;  Laterality: N/A;  . MAZE  11/26/2011   Procedure: MAZE;  Surgeon: Rexene Alberts, MD;  Location: [facility];  Service: Open Heart Surgery;  Laterality: N/A;  Cryomaze   . Radiofrequency Ablation for atrial fibrillation  12/30/07 and 06/05/09   afib ablation x 2 by JA  . Radiofrequency ablation for atrial flutter  2005   CTI    Current Facility-Administered Medications  Medication Dose Route Frequency Provider Last Rate Last Admin  . [START ON 04/05/2019] bupivacaine liposome (EXPAREL) 1.3 % injection 266 mg  20 mL Other Once Minda Ditto, RPH      . [START ON 04/05/2019] tranexamic acid (CYKLOKAPRON) 2,000 mg in sodium chloride  0.9 % 50 mL Topical Application  123XX123 mg Topical To OR Minda Ditto, RPH       Current Outpatient Medications  Medication Sig Dispense Refill Last Dose  . acetaminophen (TYLENOL) 500 MG tablet Take 1,000 mg by mouth every 6 (six) hours as needed for mild pain.      Marland Kitchen ALPRAZolam (XANAX) 0.5 MG tablet TAKE 1 TABLET BY MOUTH TWICE A DAY AS NEEDED FOR ANXIETY (Patient taking differently: Take 0.5 mg by mouth 2 (two) times daily as needed for anxiety. ) 180 tablet 1   . budesonide-formoterol (SYMBICORT) 160-4.5 MCG/ACT inhaler TAKE 2 PUFFS BY MOUTH TWICE A DAY (Patient taking differently: Inhale 2 puffs into the lungs 2 (two) times daily. ) 30.6 Inhaler 4   . cholecalciferol (VITAMIN D) 1000 units tablet Take 1,000 Units by mouth daily.     Marland Kitchen docusate sodium (COLACE) 100 MG capsule Take 200 mg by mouth daily as needed for mild constipation.      Marland Kitchen ELIQUIS 5 MG TABS tablet TAKE 1 TABLET BY MOUTH TWICE A DAY (Patient taking differently: Take 5 mg by mouth 2 (two) times daily. ) 180 tablet 1   . furosemide (LASIX) 20 MG tablet Take 1 tablet (20 mg total)  by mouth as needed. Call the office if needed > twice weekly. (Patient taking differently: Take 20 mg by mouth daily as needed for fluid. ) 90 tablet 3   . gabapentin (NEURONTIN) 100 MG capsule TAKE 1 CAPSULE BY MOUTH THREE TIMES A DAY (Patient taking differently: Take 100 mg by mouth 3 (three) times daily. ) 270 capsule 2   . lovastatin (MEVACOR) 40 MG tablet TAKE 2 TABLETS (80 MG TOTAL) BY MOUTH EVERY EVENING. 180 tablet 2   . metoprolol succinate (TOPROL-XL) 50 MG 24 hr tablet TAKE 1 AND 1/2 TABLETS BY MOUTH DAILY (Patient taking differently: Take 75 mg by mouth daily. ) 135 tablet 1   . Multiple Vitamin (MULTIVITAMIN WITH MINERALS) TABS tablet Take 1 tablet by mouth daily.      . Olmesartan-amLODIPine-HCTZ 40-5-12.5 MG TABS TAKE 1 TABLET BY MOUTH EVERY DAY (Patient taking differently: Take 1 tablet by mouth daily. ) 90 tablet 1   . PROAIR HFA 108 (90  Base) MCG/ACT inhaler TAKE 2 PUFFS BY MOUTH EVERY 6 HOURS AS NEEDED FOR WHEEZE OR SHORTNESS OF BREATH (Patient taking differently: Inhale 2 puffs into the lungs every 6 (six) hours as needed for wheezing or shortness of breath. ) 8.5 Inhaler 11   . spironolactone (ALDACTONE) 25 MG tablet Take 0.5 tablets (12.5 mg total) by mouth at bedtime. 45 tablet 3   . temazepam (RESTORIL) 30 MG capsule Take 1 capsule (30 mg total) by mouth at bedtime as needed. for sleep (Patient taking differently: Take 30 mg by mouth at bedtime. for sleep) 90 capsule 2   . traMADol (ULTRAM) 50 MG tablet TAKE 1 TABLET BY MOUTH EVERY 6 HOURS AS NEEDED (Patient taking differently: Take 50 mg by mouth every 6 (six) hours as needed for moderate pain. ) 120 tablet 2    Allergies  Allergen Reactions  . Zolpidem Tartrate Other (See Comments)    Extremely drowsy the next day    Social History   Tobacco Use  . Smoking status: Former Smoker    Packs/day: 1.50    Years: 26.00    Pack years: 39.00    Types: Cigarettes    Quit date: 04/06/1995    Years since quitting: 24.0  . Smokeless tobacco: Never Used  Substance Use Topics  . Alcohol use: No    Alcohol/week: 0.0 standard drinks    Family History  Problem Relation Age of Onset  . Dementia Father   . Hypertension Father   . Atrial fibrillation Mother   . Stroke Mother   . Dementia Paternal Grandfather   . Cancer Paternal Grandmother        type unknown     Review of Systems  Musculoskeletal: Positive for arthralgias and joint swelling.       Right knee  All other systems reviewed and are negative.   Objective:  Physical Exam  Constitutional: He is oriented to person, place, and time. He appears well-developed and well-nourished.  HENT:  Head: Normocephalic and atraumatic.  Eyes: Pupils are equal, round, and reactive to light.  Cardiovascular: Normal rate.  Respiratory: Effort normal.  GI: Soft.  Musculoskeletal:     Cervical back: Normal range of  motion.     Comments: Right knee motion is about 0-115.  He has medial joint line pain and crepitation.  There is trace effusion.  Hip motion is full and straight leg raise is negative.  His knee is stable to varus and valgus stress.  Sensation and motor function are  intact in his feet with palpable pulses on both sides.    Neurological: He is alert and oriented to person, place, and time.  Skin: Skin is warm and dry.  Psychiatric: He has a normal mood and affect. His behavior is normal. Judgment and thought content normal.    Vital signs in last 24 hours:    Labs:   Estimated body mass index is 33.27 kg/m as calculated from the following:   Height as of 03/31/19: 5\' 6"  (1.676 m).   Weight as of 03/31/19: 93.5 kg.   Imaging Review Plain radiographs demonstrate severe degenerative joint disease of the right knee(s). The overall alignment isneutral. The bone quality appears to be good for age and reported activity level.      Assessment/Plan:  End stage primary arthritis, right knee   The patient history, physical examination, clinical judgment of the provider and imaging studies are consistent with end stage degenerative joint disease of the right knee(s) and total knee arthroplasty is deemed medically necessary. The treatment options including medical management, injection therapy arthroscopy and arthroplasty were discussed at length. The risks and benefits of total knee arthroplasty were presented and reviewed. The risks due to aseptic loosening, infection, stiffness, patella tracking problems, thromboembolic complications and other imponderables were discussed. The patient acknowledged the explanation, agreed to proceed with the plan and consent was signed. Patient is being admitted for inpatient treatment for surgery, pain control, PT, OT, prophylactic antibiotics, VTE prophylaxis, progressive ambulation and ADL's and discharge planning. The patient is planning to be discharged home  with home health services   Patient's anticipated LOS is less than 2 midnights, meeting these requirements: - Younger than 76 - Lives within 1 hour of care - Has a competent adult at home to recover with post-op recover - NO history of  - Chronic pain requiring opiods  - Diabetes  - Coronary Artery Disease  - Heart failure  - Heart attack  - Stroke  - DVT/VTE  - Cardiac arrhythmia  - Respiratory Failure/COPD  - Renal failure  - Anemia  - Advanced Liver disease

## 2019-04-05 ENCOUNTER — Encounter (HOSPITAL_COMMUNITY): Payer: Self-pay | Admitting: Orthopaedic Surgery

## 2019-04-05 ENCOUNTER — Encounter (HOSPITAL_COMMUNITY)
Admission: RE | Disposition: A | Discharge status: Home / Self Care | Payer: Self-pay | Source: Home / Self Care | Attending: Orthopaedic Surgery

## 2019-04-05 ENCOUNTER — Ambulatory Visit (HOSPITAL_COMMUNITY): Payer: PPO | Admitting: Physician Assistant

## 2019-04-05 ENCOUNTER — Ambulatory Visit (HOSPITAL_COMMUNITY): Payer: PPO | Admitting: Anesthesiology

## 2019-04-05 ENCOUNTER — Ambulatory Visit (HOSPITAL_COMMUNITY)
Admission: RE | Admit: 2019-04-05 | Discharge: 2019-04-05 | Disposition: A | Discharge status: Home / Self Care | Payer: PPO | Attending: Orthopaedic Surgery | Admitting: Orthopaedic Surgery

## 2019-04-05 DIAGNOSIS — G4733 Obstructive sleep apnea (adult) (pediatric): Secondary | ICD-10-CM | POA: Diagnosis not present

## 2019-04-05 DIAGNOSIS — I5042 Chronic combined systolic (congestive) and diastolic (congestive) heart failure: Secondary | ICD-10-CM | POA: Insufficient documentation

## 2019-04-05 DIAGNOSIS — J449 Chronic obstructive pulmonary disease, unspecified: Secondary | ICD-10-CM | POA: Insufficient documentation

## 2019-04-05 DIAGNOSIS — Z79899 Other long term (current) drug therapy: Secondary | ICD-10-CM | POA: Insufficient documentation

## 2019-04-05 DIAGNOSIS — M1711 Unilateral primary osteoarthritis, right knee: Secondary | ICD-10-CM | POA: Insufficient documentation

## 2019-04-05 DIAGNOSIS — Z87891 Personal history of nicotine dependence: Secondary | ICD-10-CM | POA: Diagnosis not present

## 2019-04-05 DIAGNOSIS — I2510 Atherosclerotic heart disease of native coronary artery without angina pectoris: Secondary | ICD-10-CM | POA: Diagnosis not present

## 2019-04-05 DIAGNOSIS — M4802 Spinal stenosis, cervical region: Secondary | ICD-10-CM | POA: Diagnosis not present

## 2019-04-05 DIAGNOSIS — Z981 Arthrodesis status: Secondary | ICD-10-CM | POA: Insufficient documentation

## 2019-04-05 DIAGNOSIS — I480 Paroxysmal atrial fibrillation: Secondary | ICD-10-CM | POA: Diagnosis not present

## 2019-04-05 DIAGNOSIS — I130 Hypertensive heart and chronic kidney disease with heart failure and stage 1 through stage 4 chronic kidney disease, or unspecified chronic kidney disease: Secondary | ICD-10-CM | POA: Insufficient documentation

## 2019-04-05 DIAGNOSIS — Z951 Presence of aortocoronary bypass graft: Secondary | ICD-10-CM | POA: Diagnosis not present

## 2019-04-05 DIAGNOSIS — E039 Hypothyroidism, unspecified: Secondary | ICD-10-CM | POA: Diagnosis not present

## 2019-04-05 DIAGNOSIS — E1122 Type 2 diabetes mellitus with diabetic chronic kidney disease: Secondary | ICD-10-CM | POA: Insufficient documentation

## 2019-04-05 DIAGNOSIS — E669 Obesity, unspecified: Secondary | ICD-10-CM | POA: Insufficient documentation

## 2019-04-05 DIAGNOSIS — E785 Hyperlipidemia, unspecified: Secondary | ICD-10-CM | POA: Diagnosis not present

## 2019-04-05 DIAGNOSIS — G8918 Other acute postprocedural pain: Secondary | ICD-10-CM | POA: Diagnosis not present

## 2019-04-05 DIAGNOSIS — Z7951 Long term (current) use of inhaled steroids: Secondary | ICD-10-CM | POA: Insufficient documentation

## 2019-04-05 DIAGNOSIS — N183 Chronic kidney disease, stage 3 unspecified: Secondary | ICD-10-CM | POA: Insufficient documentation

## 2019-04-05 DIAGNOSIS — F419 Anxiety disorder, unspecified: Secondary | ICD-10-CM | POA: Diagnosis not present

## 2019-04-05 DIAGNOSIS — Z6833 Body mass index (BMI) 33.0-33.9, adult: Secondary | ICD-10-CM | POA: Diagnosis not present

## 2019-04-05 DIAGNOSIS — Z7901 Long term (current) use of anticoagulants: Secondary | ICD-10-CM | POA: Insufficient documentation

## 2019-04-05 HISTORY — PX: TOTAL KNEE ARTHROPLASTY: SHX125

## 2019-04-05 LAB — TYPE AND SCREEN
ABO/RH(D): B NEG
Antibody Screen: NEGATIVE

## 2019-04-05 SURGERY — ARTHROPLASTY, KNEE, TOTAL
Anesthesia: Spinal | Site: Knee | Laterality: Right

## 2019-04-05 MED ORDER — PHENYLEPHRINE HCL (PRESSORS) 10 MG/ML IV SOLN
INTRAVENOUS | Status: AC
  Filled 2019-04-05: qty 1

## 2019-04-05 MED ORDER — LIDOCAINE 2% (20 MG/ML) 5 ML SYRINGE
INTRAMUSCULAR | Status: AC
  Filled 2019-04-05: qty 5

## 2019-04-05 MED ORDER — SODIUM CHLORIDE (PF) 0.9 % IJ SOLN
INTRAMUSCULAR | Status: DC | PRN
  Administered 2019-04-05: 30 mL

## 2019-04-05 MED ORDER — TRANEXAMIC ACID 1000 MG/10ML IV SOLN
INTRAVENOUS | Status: DC | PRN
  Administered 2019-04-05: 2000 mg via TOPICAL

## 2019-04-05 MED ORDER — CEFAZOLIN SODIUM-DEXTROSE 2-4 GM/100ML-% IV SOLN: 2.0000 g | Freq: Four times a day (QID) | INTRAVENOUS | Status: DC

## 2019-04-05 MED ORDER — METHOCARBAMOL 500 MG PO TABS
ORAL_TABLET | ORAL | Status: AC
  Filled 2019-04-05: qty 1

## 2019-04-05 MED ORDER — 0.9 % SODIUM CHLORIDE (POUR BTL) OPTIME
TOPICAL | Status: DC | PRN
  Administered 2019-04-05: 1000 mL

## 2019-04-05 MED ORDER — PROPOFOL 1000 MG/100ML IV EMUL
INTRAVENOUS | Status: AC
  Filled 2019-04-05: qty 200

## 2019-04-05 MED ORDER — HYDROCODONE-ACETAMINOPHEN 5-325 MG PO TABS: 1.0000 | ORAL_TABLET | Freq: Four times a day (QID) | ORAL | 0 refills | Status: DC | PRN

## 2019-04-05 MED ORDER — DEXAMETHASONE SODIUM PHOSPHATE 10 MG/ML IJ SOLN
INTRAMUSCULAR | Status: AC
  Filled 2019-04-05: qty 1

## 2019-04-05 MED ORDER — PHENYLEPHRINE HCL-NACL 10-0.9 MG/250ML-% IV SOLN
INTRAVENOUS | Status: DC | PRN
  Administered 2019-04-05: 25 ug/min via INTRAVENOUS

## 2019-04-05 MED ORDER — PROPOFOL 500 MG/50ML IV EMUL
INTRAVENOUS | Status: DC | PRN
  Administered 2019-04-05: 100 ug/kg/min via INTRAVENOUS

## 2019-04-05 MED ORDER — FENTANYL CITRATE (PF) 100 MCG/2ML IJ SOLN: 25.0000 ug | INTRAMUSCULAR | Status: DC | PRN

## 2019-04-05 MED ORDER — TIZANIDINE HCL 4 MG PO TABS: 4.0000 mg | ORAL_TABLET | Freq: Four times a day (QID) | ORAL | 1 refills | Status: DC | PRN

## 2019-04-05 MED ORDER — POVIDONE-IODINE 10 % EX SWAB
2.0000 "application " | Freq: Once | CUTANEOUS | Status: AC
  Administered 2019-04-05: 2 via TOPICAL

## 2019-04-05 MED ORDER — BUPIVACAINE LIPOSOME 1.3 % IJ SUSP
INTRAMUSCULAR | Status: DC | PRN
  Administered 2019-04-05: 20 mL

## 2019-04-05 MED ORDER — BUPIVACAINE-EPINEPHRINE (PF) 0.5% -1:200000 IJ SOLN
INTRAMUSCULAR | Status: DC | PRN
  Administered 2019-04-05: 15 mL

## 2019-04-05 MED ORDER — FENTANYL CITRATE (PF) 100 MCG/2ML IJ SOLN
INTRAMUSCULAR | Status: DC | PRN
  Administered 2019-04-05: 100 ug via INTRAVENOUS

## 2019-04-05 MED ORDER — METHOCARBAMOL 500 MG IVPB - SIMPLE MED: 500.0000 mg | Freq: Four times a day (QID) | INTRAVENOUS | Status: DC | PRN

## 2019-04-05 MED ORDER — LIDOCAINE HCL (CARDIAC) PF 100 MG/5ML IV SOSY
PREFILLED_SYRINGE | INTRAVENOUS | Status: DC | PRN
  Administered 2019-04-05: 80 mg via INTRAVENOUS

## 2019-04-05 MED ORDER — METHOCARBAMOL 500 MG PO TABS
500.0000 mg | ORAL_TABLET | Freq: Four times a day (QID) | ORAL | Status: DC | PRN
  Administered 2019-04-05: 500 mg via ORAL

## 2019-04-05 MED ORDER — ONDANSETRON HCL 4 MG/2ML IJ SOLN
INTRAMUSCULAR | Status: AC
  Filled 2019-04-05: qty 2

## 2019-04-05 MED ORDER — OXYCODONE HCL 5 MG/5ML PO SOLN: 5.0000 mg | Freq: Once | ORAL | Status: AC | PRN

## 2019-04-05 MED ORDER — ONDANSETRON HCL 4 MG/2ML IJ SOLN
INTRAMUSCULAR | Status: DC | PRN
  Administered 2019-04-05: 4 mg via INTRAVENOUS

## 2019-04-05 MED ORDER — SODIUM CHLORIDE (PF) 0.9 % IJ SOLN
INTRAMUSCULAR | Status: AC
  Filled 2019-04-05: qty 50

## 2019-04-05 MED ORDER — MIDAZOLAM HCL 5 MG/5ML IJ SOLN
INTRAMUSCULAR | Status: DC | PRN
  Administered 2019-04-05: 2 mg via INTRAVENOUS

## 2019-04-05 MED ORDER — LACTATED RINGERS IV BOLUS: 250.0000 mL | Freq: Once | INTRAVENOUS | Status: DC

## 2019-04-05 MED ORDER — GLYCOPYRROLATE PF 0.2 MG/ML IJ SOSY
PREFILLED_SYRINGE | INTRAMUSCULAR | Status: AC
  Filled 2019-04-05: qty 1

## 2019-04-05 MED ORDER — FENTANYL CITRATE (PF) 100 MCG/2ML IJ SOLN
INTRAMUSCULAR | Status: AC
  Filled 2019-04-05: qty 2

## 2019-04-05 MED ORDER — DEXAMETHASONE SODIUM PHOSPHATE 10 MG/ML IJ SOLN
INTRAMUSCULAR | Status: DC | PRN
  Administered 2019-04-05: 10 mg via INTRAVENOUS

## 2019-04-05 MED ORDER — LACTATED RINGERS IV SOLN: INTRAVENOUS | Status: DC

## 2019-04-05 MED ORDER — MIDAZOLAM HCL 2 MG/2ML IJ SOLN
INTRAMUSCULAR | Status: AC
  Filled 2019-04-05: qty 2

## 2019-04-05 MED ORDER — BUPIVACAINE IN DEXTROSE 0.75-8.25 % IT SOLN
INTRATHECAL | Status: DC | PRN
  Administered 2019-04-05: 1.6 mL via INTRATHECAL

## 2019-04-05 MED ORDER — BUPIVACAINE-EPINEPHRINE 0.5% -1:200000 IJ SOLN
INTRAMUSCULAR | Status: AC
  Filled 2019-04-05: qty 1

## 2019-04-05 MED ORDER — EPHEDRINE SULFATE-NACL 50-0.9 MG/10ML-% IV SOSY
PREFILLED_SYRINGE | INTRAVENOUS | Status: DC | PRN
  Administered 2019-04-05: 10 mg via INTRAVENOUS

## 2019-04-05 MED ORDER — GLYCOPYRROLATE 0.2 MG/ML IJ SOLN
INTRAMUSCULAR | Status: DC | PRN
  Administered 2019-04-05: .2 mg via INTRAVENOUS

## 2019-04-05 MED ORDER — ONDANSETRON HCL 4 MG/2ML IJ SOLN: 4.0000 mg | Freq: Once | INTRAMUSCULAR | Status: DC | PRN

## 2019-04-05 MED ORDER — CEFAZOLIN SODIUM-DEXTROSE 2-4 GM/100ML-% IV SOLN
2.0000 g | INTRAVENOUS | Status: AC
  Administered 2019-04-05: 08:00:00 2 g via INTRAVENOUS
  Filled 2019-04-05: qty 100

## 2019-04-05 MED ORDER — ROPIVACAINE HCL 5 MG/ML IJ SOLN
INTRAMUSCULAR | Status: DC | PRN
  Administered 2019-04-05: 30 mL via PERINEURAL

## 2019-04-05 MED ORDER — TRANEXAMIC ACID-NACL 1000-0.7 MG/100ML-% IV SOLN
INTRAVENOUS | Status: AC
  Administered 2019-04-05: 1000 mg via INTRAVENOUS
  Filled 2019-04-05: qty 100

## 2019-04-05 MED ORDER — TRANEXAMIC ACID-NACL 1000-0.7 MG/100ML-% IV SOLN
1000.0000 mg | INTRAVENOUS | Status: AC
  Administered 2019-04-05: 09:00:00 1000 mg via INTRAVENOUS
  Filled 2019-04-05: qty 100

## 2019-04-05 MED ORDER — OXYCODONE HCL 5 MG PO TABS
ORAL_TABLET | ORAL | Status: AC
  Filled 2019-04-05: qty 1

## 2019-04-05 MED ORDER — PROPOFOL 10 MG/ML IV BOLUS
INTRAVENOUS | Status: DC | PRN
  Administered 2019-04-05 (×2): 20 mg via INTRAVENOUS

## 2019-04-05 MED ORDER — CHLORHEXIDINE GLUCONATE 4 % EX LIQD: 60.0000 mL | Freq: Once | CUTANEOUS | Status: DC

## 2019-04-05 MED ORDER — SODIUM CHLORIDE 0.9 % IR SOLN
Status: DC | PRN
  Administered 2019-04-05: 2000 mL

## 2019-04-05 MED ORDER — OXYCODONE HCL 5 MG PO TABS
5.0000 mg | ORAL_TABLET | Freq: Once | ORAL | Status: AC | PRN
  Administered 2019-04-05: 5 mg via ORAL

## 2019-04-05 MED ORDER — TRANEXAMIC ACID-NACL 1000-0.7 MG/100ML-% IV SOLN: 1000.0000 mg | Freq: Once | INTRAVENOUS | Status: AC

## 2019-04-05 MED ORDER — LACTATED RINGERS IV BOLUS
500.0000 mL | Freq: Once | INTRAVENOUS | Status: AC
  Administered 2019-04-05: 500 mL via INTRAVENOUS

## 2019-04-05 MED ORDER — LACTATED RINGERS IV SOLN: INTRAVENOUS | Status: DC | PRN

## 2019-04-05 SURGICAL SUPPLY — 54 items
ATTUNE MED DOME PAT 38 KNEE (Knees) ×1 IMPLANT
ATTUNE MED DOME PAT 38MM KNEE (Knees) ×1 IMPLANT
ATTUNE PS FEM RT SZ 4 CEM KNEE (Femur) ×2 IMPLANT
ATTUNE PSRP INSR SZ4 6 KNEE (Insert) ×1 IMPLANT
ATTUNE PSRP INSR SZ4 6MM KNEE (Insert) ×1 IMPLANT
BAG DECANTER FOR FLEXI CONT (MISCELLANEOUS) ×3 IMPLANT
BAG SPEC THK2 15X12 ZIP CLS (MISCELLANEOUS) ×1
BAG ZIPLOCK 12X15 (MISCELLANEOUS) ×3 IMPLANT
BASE TIBIA ATTUNE KNEE SYS SZ6 (Knees) IMPLANT
BLADE SAGITTAL 25.0X1.19X90 (BLADE) ×2 IMPLANT
BLADE SAGITTAL 25.0X1.19X90MM (BLADE) ×1
BLADE SAW SGTL 11.0X1.19X90.0M (BLADE) ×3 IMPLANT
BNDG ELASTIC 6X5.8 VLCR STR LF (GAUZE/BANDAGES/DRESSINGS) ×3 IMPLANT
BOOTIES KNEE HIGH SLOAN (MISCELLANEOUS) ×3 IMPLANT
BOWL SMART MIX CTS (DISPOSABLE) ×3 IMPLANT
BSPLAT TIB 6 CMNT ROT PLAT STR (Knees) ×1 IMPLANT
CEMENT HV SMART SET (Cement) ×6 IMPLANT
COVER SURGICAL LIGHT HANDLE (MISCELLANEOUS) ×3 IMPLANT
COVER WAND RF STERILE (DRAPES) ×3 IMPLANT
CUFF TOURN SGL QUICK 34 (TOURNIQUET CUFF) ×3
CUFF TRNQT CYL 34X4.125X (TOURNIQUET CUFF) ×1 IMPLANT
DECANTER SPIKE VIAL GLASS SM (MISCELLANEOUS) ×6 IMPLANT
DRAPE SHEET LG 3/4 BI-LAMINATE (DRAPES) ×3 IMPLANT
DRAPE TOP 10253 STERILE (DRAPES) ×3 IMPLANT
DRAPE U-SHAPE 47X51 STRL (DRAPES) ×3 IMPLANT
DRSG AQUACEL AG ADV 3.5X10 (GAUZE/BANDAGES/DRESSINGS) ×3 IMPLANT
DURAPREP 26ML APPLICATOR (WOUND CARE) ×6 IMPLANT
ELECT REM PT RETURN 15FT ADLT (MISCELLANEOUS) ×3 IMPLANT
GLOVE BIO SURGEON STRL SZ8 (GLOVE) ×6 IMPLANT
GLOVE BIOGEL PI IND STRL 8 (GLOVE) ×2 IMPLANT
GLOVE BIOGEL PI INDICATOR 8 (GLOVE) ×4
GOWN STRL REUS W/TWL XL LVL3 (GOWN DISPOSABLE) ×6 IMPLANT
HANDPIECE INTERPULSE COAX TIP (DISPOSABLE) ×3
HOLDER FOLEY CATH W/STRAP (MISCELLANEOUS) IMPLANT
HOOD PEEL AWAY FLYTE STAYCOOL (MISCELLANEOUS) ×9 IMPLANT
KIT TURNOVER KIT A (KITS) IMPLANT
MANIFOLD NEPTUNE II (INSTRUMENTS) ×3 IMPLANT
NS IRRIG 1000ML POUR BTL (IV SOLUTION) ×3 IMPLANT
PACK TOTAL KNEE CUSTOM (KITS) ×3 IMPLANT
PAD ARMBOARD 7.5X6 YLW CONV (MISCELLANEOUS) ×3 IMPLANT
PENCIL SMOKE EVACUATOR (MISCELLANEOUS) IMPLANT
PIN STEINMAN FIXATION KNEE (PIN) ×2 IMPLANT
PIN THREADED HEADED SIGMA (PIN) ×2 IMPLANT
PROTECTOR NERVE ULNAR (MISCELLANEOUS) ×3 IMPLANT
SET HNDPC FAN SPRY TIP SCT (DISPOSABLE) ×1 IMPLANT
SUT ETHIBOND NAB CT1 #1 30IN (SUTURE) ×6 IMPLANT
SUT VIC AB 0 CT1 36 (SUTURE) ×3 IMPLANT
SUT VIC AB 2-0 CT1 27 (SUTURE) ×3
SUT VIC AB 2-0 CT1 TAPERPNT 27 (SUTURE) ×1 IMPLANT
SUT VICRYL AB 3-0 FS1 BRD 27IN (SUTURE) ×3 IMPLANT
TIBIA ATTUNE KNEE SYS BASE SZ6 (Knees) ×3 IMPLANT
TRAY FOLEY MTR SLVR 16FR STAT (SET/KITS/TRAYS/PACK) IMPLANT
WATER STERILE IRR 1000ML POUR (IV SOLUTION) ×3 IMPLANT
WRAP KNEE MAXI GEL POST OP (GAUZE/BANDAGES/DRESSINGS) ×3 IMPLANT

## 2019-04-05 NOTE — Anesthesia Procedure Notes (Signed)
Spinal  Patient location during procedure: OR End time: 04/05/2019 7:42 AM Staffing Performed: resident/CRNA  Resident/CRNA: Caryl Pina T, CRNA Preanesthetic Checklist Completed: patient identified, IV checked, site marked, risks and benefits discussed, surgical consent, monitors and equipment checked, pre-op evaluation and timeout performed Spinal Block Patient position: sitting Prep: DuraPrep Patient monitoring: heart rate, cardiac monitor, continuous pulse ox and blood pressure Approach: midline Location: L3-4 Injection technique: single-shot Needle Needle type: Pencan  Needle gauge: 24 G Needle length: 9 cm Assessment Sensory level: T4 Additional Notes Expiration date of kit checked and confirmed. Patient tolerated procedure well, without complications.

## 2019-04-05 NOTE — Progress Notes (Signed)
Teaching done at 1330

## 2019-04-05 NOTE — Op Note (Signed)
PREOP DIAGNOSIS: DJD RIGHT KNEE POSTOP DIAGNOSIS: same PROCEDURE: RIGHT TKR ANESTHESIA: Spinal and MAC ATTENDING SURGEON: Hessie Dibble ASSISTANT: Loni Dolly PA  INDICATIONS FOR PROCEDURE: Scott [name]. is a 64 y.o. male who has struggled for a long time with pain due to degenerative arthritis of the right knee.  The patient has failed many conservative non-operative measures and at this point has pain which limits the ability to sleep and walk.  The patient is offered total knee replacement.  Informed operative consent was obtained after discussion of possible risks of anesthesia, infection, neurovascular injury, DVT, and death.  The importance of the post-operative rehabilitation protocol to optimize result was stressed extensively with the patient.  SUMMARY OF FINDINGS AND PROCEDURE:  Scott Pape. was taken to the operative suite where under the above anesthesia a right knee replacement was performed.  There were advanced degenerative changes and the bone quality was excellent.  We used the DePuy Attune system and placed size 4 femur, 6 tibia, 38 mm all polyethylene patella, and a size 6 mm spacer.  Loni Dolly PA-C assisted throughout and was invaluable to the completion of the case in that he helped retract and maintain exposure while I placed components.  He also helped close thereby minimizing OR time.  The patient was admitted for appropriate post-op care to include perioperative antibiotics and mechanical and pharmacologic measures for DVT prophylaxis.  DESCRIPTION OF PROCEDURE:  Scott Duckett. was taken to the operative suite where the above anesthesia was applied.  The patient was positioned supine and prepped and draped in normal sterile fashion.  An appropriate time out was performed.  After the administration of kefzol pre-op antibiotic the leg was elevated and exsanguinated and a tourniquet inflated. A standard longitudinal incision was made on the anterior knee.   Dissection was carried down to the extensor mechanism.  All appropriate anti-infective measures were used including the pre-operative antibiotic, betadine impregnated drape, and closed hooded exhaust systems for each member of the surgical team.  A medial parapatellar incision was made in the extensor mechanism and the knee cap flipped and the knee flexed.  Some residual meniscal tissues were removed along with any remaining ACL/PCL tissue.  A guide was placed on the tibia and a flat cut was made on it's superior surface.  An intramedullary guide was placed in the femur and was utilized to make anterior and posterior cuts creating an appropriate flexion gap.  A second intramedullary guide was placed in the femur to make a distal cut properly balancing the knee with an extension gap equal to the flexion gap.  The three bones sized to the above mentioned sizes and the appropriate guides were placed and utilized.  A trial reduction was done and the knee easily came to full extension and the patella tracked well on flexion.  The trial components were removed and all bones were cleaned with pulsatile lavage and then dried thoroughly.  Cement was mixed and was pressurized onto the bones followed by placement of the aforementioned components.  Excess cement was trimmed and pressure was held on the components until the cement had hardened.  The tourniquet was deflated and a small amount of bleeding was controlled with cautery and pressure.  The knee was irrigated thoroughly.  The extensor mechanism was re-approximated with #1 ethibond in interrupted fashion.  The knee was flexed and the repair was solid.  The subcutaneous tissues were re-approximated with #0 and #2-0 vicryl and the  skin closed with a subcuticular stitch and steristrips.  A sterile dressing was applied.  Intraoperative fluids, EBL, and tourniquet time can be obtained from anesthesia records.  DISPOSITION:  The patient was taken to recovery room in stable  condition and admitted for appropriate post-op care to include peri-operative antibiotic and DVT prophylaxis with mechanical and pharmacologic measures.  Hessie Dibble 04/05/2019, 9:17 AM

## 2019-04-05 NOTE — Anesthesia Procedure Notes (Signed)
Anesthesia Regional Block: Adductor canal block   Pre-Anesthetic Checklist: ,, timeout performed, Correct Patient, Correct Site, Correct Laterality, Correct Procedure, Correct Position, site marked, Risks and benefits discussed,  Surgical consent,  Pre-op evaluation,  At surgeon's request and post-op pain management  Laterality: Right  Prep: chloraprep       Needles:  Injection technique: Single-shot  Needle Type: Echogenic Stimulator Needle     Needle Length: 10cm  Needle Gauge: 21     Additional Needles:   Procedures:,,,, ultrasound used (permanent image in chart),,,,  Narrative:  Start time: 04/05/2019 7:14 AM End time: 04/05/2019 7:17 AM Injection made incrementally with aspirations every 5 mL.  Performed by: Personally  Anesthesiologist: Lidia Collum, MD  Additional Notes: Monitors applied. Injection made in 5cc increments. No resistance to injection. Good needle visualization. Patient tolerated procedure well.

## 2019-04-05 NOTE — Interval H&P Note (Signed)
History and Physical Interval Note:  04/05/2019 7:27 AM  Scott [name].  has presented today for surgery, with the diagnosis of RIGHT KNEE DEGENERATIVE JOINT DISEASE.  The various methods of treatment have been discussed with the patient and family. After consideration of risks, benefits and other options for treatment, the patient has consented to  Procedure(s): RIGHT TOTAL KNEE ARTHROPLASTY (Right) as a surgical intervention.  The patient's history has been reviewed, patient examined, no change in status, stable for surgery.  I have reviewed the patient's chart and labs.  Questions were answered to the patient's satisfaction.     Hessie Dibble

## 2019-04-05 NOTE — Progress Notes (Signed)
PA Nida contacted to clarify when to restart eliquis.  Ordered to restaart eliquis in AM

## 2019-04-05 NOTE — Evaluation (Signed)
Physical Therapy Evaluation Patient Details Name: Scott [name]. MRN: [id number] DOB: [birth year] Today's Date: 04/05/2019   History of Present Illness  s/p R TKA. PMH: repeated falls/gait disturbance, multi-level c spine ACDF, COPD, HTN, sicl sinus syndrome, CHF, CKD, OSA  Clinical Impression  Patient evaluated by Physical Therapy with no further acute PT needs identified. All education has been completed and the patient has no further questions. * Pt doing very well, motivated to work with PT to get back to walking his dog. Ok to d/c with family assist from PT standpoint See below for any follow-up Physical Therapy or equipment needs. PT is signing off. Thank you for this referral.     Follow Up Recommendations Follow surgeon's recommendation for DC plan and follow-up therapies    Equipment Recommendations  None recommended by PT    Recommendations for Other Services       Precautions / Restrictions Precautions Precautions: Fall;Knee Restrictions Weight Bearing Restrictions: No Other Position/Activity Restrictions: WBAT      Mobility  Bed Mobility Overal bed mobility: Needs Assistance Bed Mobility: Supine to Sit;Sit to Supine     Supine to sit: Min guard Sit to supine: Min guard   General bed mobility comments: for safety  Transfers Overall transfer level: Needs assistance Equipment used: Rolling walker (2 wheeled) Transfers: Sit to/from Stand Sit to Stand: Min guard         General transfer comment: cues for hand placement  Ambulation/Gait Ambulation/Gait assistance: Min guard Gait Distance (Feet): 100 Feet Assistive device: Rolling walker (2 wheeled) Gait Pattern/deviations: Step-to pattern;Decreased stance time - right     General Gait Details: cues for sequence and RW position   Stairs  up/down 2 steps with RW and min assist, cues for sequence and technique           Wheelchair Mobility    Modified Rankin (Stroke Patients Only)        Balance                                             Pertinent Vitals/Pain Pain Assessment: 0-10 Pain Score: 4  Pain Location: right knee Pain Descriptors / Indicators: Aching;Sore Pain Intervention(s): Premedicated before session;Monitored during session;Limited activity within patient's tolerance    Home Living Family/patient expects to be discharged to:: Private residence   Available Help at Discharge: Family Type of Home: House Home Access: Stairs to enter Entrance Stairs-Rails: None Entrance Stairs-Number of Steps: 2 Home Layout: One level Home Equipment: Environmental consultant - 2 wheels;Bedside commode Additional Comments: sister is coming from out of town to assist    Prior Function Level of Independence: Independent         Comments: active, walks dog daily     Hand Dominance        Extremity/Trunk Assessment   Upper Extremity Assessment Upper Extremity Assessment: Overall WFL for tasks assessed    Lower Extremity Assessment Lower Extremity Assessment: RLE deficits/detail RLE Deficits / Details: ankle WFL;  knee and hip grossly 2+/5, AAROM righ tknee flexion 10 to ~65 degrees       Communication   Communication: No difficulties  Cognition Arousal/Alertness: Awake/alert Behavior During Therapy: WFL for tasks assessed/performed Overall Cognitive Status: Within Functional Limits for tasks assessed  General Comments      Exercises Total Joint Exercises Ankle Circles/Pumps: AROM;Both Quad Sets: AROM;Both Heel Slides: AAROM;Right;10 reps Hip ABduction/ADduction: AROM;AAROM;Right;10 reps Straight Leg Raises: AROM;AAROM;Right;10 reps   Assessment/Plan    PT Assessment All further PT needs can be met in the next venue of care  PT Problem List         PT Treatment Interventions      PT Goals (Current goals can be found in the Care Plan section)  Acute Rehab PT Goals PT Goal  Formulation: All assessment and education complete, DC therapy    Frequency     Barriers to discharge        Co-evaluation               AM-PAC PT "6 Clicks" Mobility  Outcome Measure Help needed turning from your back to your side while in a flat bed without using bedrails?: A Little Help needed moving from lying on your back to sitting on the side of a flat bed without using bedrails?: A Little Help needed moving to and from a bed to a chair (including a wheelchair)?: A Little Help needed standing up from a chair using your arms (e.g., wheelchair or bedside chair)?: A Little Help needed to walk in hospital room?: A Little Help needed climbing 3-5 steps with a railing? : A Little 6 Click Score: 18    End of Session Equipment Utilized During Treatment: Gait belt Activity Tolerance: Patient tolerated treatment well Patient left: with call bell/phone within reach;in bed   PT Visit Diagnosis: Difficulty in walking, not elsewhere classified (R26.2)    Time: 8003-4917 PT Time Calculation (min) (ACUTE ONLY): 37 min   Charges:   PT Evaluation $PT Eval Low Complexity: 1 Low PT Treatments $Gait Training: 8-22 mins        Baxter Flattery, PT   Acute Rehab Dept [facility]): [phone]   04/05/2019   [facility] 04/05/2019, 1:07 PM

## 2019-04-05 NOTE — Transfer of Care (Signed)
Immediate Anesthesia Transfer of Care Note  Patient: Scott [name].  Procedure(s) Performed: RIGHT TOTAL KNEE ARTHROPLASTY (Right Knee)  Patient Location: PACU  Anesthesia Type:Spinal  Level of Consciousness: awake, alert , oriented and patient cooperative  Airway & Oxygen Therapy: Patient Spontanous Breathing and Patient connected to face mask oxygen  Post-op Assessment: Report given to RN and Post -op Vital signs reviewed and stable  Post vital signs: Reviewed and stable  Last Vitals:  Vitals Value Taken Time  BP 123/76 04/05/19 0945  Temp    Pulse 75 04/05/19 0946  Resp 16 04/05/19 0946  SpO2 100 % 04/05/19 0946  Vitals shown include unvalidated device data.  Last Pain:  Vitals:   04/05/19 0942  TempSrc:   PainSc: (P) 0-No pain      Patients Stated Pain Goal: 4 (0000000 [id number])  Complications: No apparent anesthesia complications

## 2019-04-05 NOTE — Anesthesia Postprocedure Evaluation (Signed)
Anesthesia Post Note  Patient: Scott [name].  Procedure(s) Performed: RIGHT TOTAL KNEE ARTHROPLASTY (Right Knee)     Patient location during evaluation: PACU Anesthesia Type: Spinal Level of consciousness: oriented and awake and alert Pain management: pain level controlled Vital Signs Assessment: post-procedure vital signs reviewed and stable Respiratory status: spontaneous breathing, respiratory function stable and nonlabored ventilation Cardiovascular status: blood pressure returned to baseline and stable Postop Assessment: no headache, no backache, no apparent nausea or vomiting and spinal receding Anesthetic complications: no    Last Vitals:  Vitals:   04/05/19 1103 04/05/19 1220  BP: 125/73 125/64  Pulse: 65   Resp: 18 16  Temp: 36.6 C 36.4 C  SpO2: 96%     Last Pain:  Vitals:   04/05/19 1220  TempSrc: Oral  PainSc:                  Lidia Collum

## 2019-04-05 NOTE — Progress Notes (Signed)
Pt discharged home with sister.  Sister aware no CPM needed, [facility] is listed as home health for PT in pt's discharge forms, and to restatr eliquis in AM.

## 2019-04-05 NOTE — Progress Notes (Signed)
Dr Latanya Maudlin contacted to clarify orders:  No CPM needed.  Home health PT referral has been made.

## 2019-04-06 ENCOUNTER — Encounter: Payer: Self-pay | Admitting: *Deleted

## 2019-04-06 DIAGNOSIS — I480 Paroxysmal atrial fibrillation: Secondary | ICD-10-CM | POA: Diagnosis not present

## 2019-04-06 DIAGNOSIS — I2510 Atherosclerotic heart disease of native coronary artery without angina pectoris: Secondary | ICD-10-CM | POA: Diagnosis not present

## 2019-04-06 DIAGNOSIS — Z7901 Long term (current) use of anticoagulants: Secondary | ICD-10-CM | POA: Diagnosis not present

## 2019-04-06 DIAGNOSIS — Z96651 Presence of right artificial knee joint: Secondary | ICD-10-CM | POA: Diagnosis not present

## 2019-04-06 DIAGNOSIS — M4802 Spinal stenosis, cervical region: Secondary | ICD-10-CM | POA: Diagnosis not present

## 2019-04-06 DIAGNOSIS — E039 Hypothyroidism, unspecified: Secondary | ICD-10-CM | POA: Diagnosis not present

## 2019-04-06 DIAGNOSIS — R296 Repeated falls: Secondary | ICD-10-CM | POA: Diagnosis not present

## 2019-04-06 DIAGNOSIS — Z9889 Other specified postprocedural states: Secondary | ICD-10-CM | POA: Diagnosis not present

## 2019-04-06 DIAGNOSIS — G4733 Obstructive sleep apnea (adult) (pediatric): Secondary | ICD-10-CM | POA: Diagnosis not present

## 2019-04-06 DIAGNOSIS — E789 Disorder of lipoprotein metabolism, unspecified: Secondary | ICD-10-CM | POA: Diagnosis not present

## 2019-04-06 DIAGNOSIS — Z951 Presence of aortocoronary bypass graft: Secondary | ICD-10-CM | POA: Diagnosis not present

## 2019-04-06 DIAGNOSIS — K625 Hemorrhage of anus and rectum: Secondary | ICD-10-CM | POA: Diagnosis not present

## 2019-04-06 DIAGNOSIS — M5412 Radiculopathy, cervical region: Secondary | ICD-10-CM | POA: Diagnosis not present

## 2019-04-06 DIAGNOSIS — I5042 Chronic combined systolic (congestive) and diastolic (congestive) heart failure: Secondary | ICD-10-CM | POA: Diagnosis not present

## 2019-04-06 DIAGNOSIS — Z471 Aftercare following joint replacement surgery: Secondary | ICD-10-CM | POA: Diagnosis not present

## 2019-04-06 DIAGNOSIS — M1712 Unilateral primary osteoarthritis, left knee: Secondary | ICD-10-CM | POA: Diagnosis not present

## 2019-04-06 DIAGNOSIS — I130 Hypertensive heart and chronic kidney disease with heart failure and stage 1 through stage 4 chronic kidney disease, or unspecified chronic kidney disease: Secondary | ICD-10-CM | POA: Diagnosis not present

## 2019-04-06 DIAGNOSIS — N183 Chronic kidney disease, stage 3 unspecified: Secondary | ICD-10-CM | POA: Diagnosis not present

## 2019-04-06 DIAGNOSIS — J441 Chronic obstructive pulmonary disease with (acute) exacerbation: Secondary | ICD-10-CM | POA: Diagnosis not present

## 2019-04-06 DIAGNOSIS — I4892 Unspecified atrial flutter: Secondary | ICD-10-CM | POA: Diagnosis not present

## 2019-04-06 DIAGNOSIS — R739 Hyperglycemia, unspecified: Secondary | ICD-10-CM | POA: Diagnosis not present

## 2019-04-06 DIAGNOSIS — M722 Plantar fascial fibromatosis: Secondary | ICD-10-CM | POA: Diagnosis not present

## 2019-04-06 DIAGNOSIS — I495 Sick sinus syndrome: Secondary | ICD-10-CM | POA: Diagnosis not present

## 2019-04-06 DIAGNOSIS — F411 Generalized anxiety disorder: Secondary | ICD-10-CM | POA: Diagnosis not present

## 2019-04-12 DIAGNOSIS — F411 Generalized anxiety disorder: Secondary | ICD-10-CM | POA: Diagnosis not present

## 2019-04-12 DIAGNOSIS — Z471 Aftercare following joint replacement surgery: Secondary | ICD-10-CM | POA: Diagnosis not present

## 2019-04-12 DIAGNOSIS — J441 Chronic obstructive pulmonary disease with (acute) exacerbation: Secondary | ICD-10-CM | POA: Diagnosis not present

## 2019-04-12 DIAGNOSIS — M1712 Unilateral primary osteoarthritis, left knee: Secondary | ICD-10-CM | POA: Diagnosis not present

## 2019-04-12 DIAGNOSIS — Z951 Presence of aortocoronary bypass graft: Secondary | ICD-10-CM | POA: Diagnosis not present

## 2019-04-12 DIAGNOSIS — G4733 Obstructive sleep apnea (adult) (pediatric): Secondary | ICD-10-CM | POA: Diagnosis not present

## 2019-04-12 DIAGNOSIS — I130 Hypertensive heart and chronic kidney disease with heart failure and stage 1 through stage 4 chronic kidney disease, or unspecified chronic kidney disease: Secondary | ICD-10-CM | POA: Diagnosis not present

## 2019-04-12 DIAGNOSIS — I5042 Chronic combined systolic (congestive) and diastolic (congestive) heart failure: Secondary | ICD-10-CM | POA: Diagnosis not present

## 2019-04-12 DIAGNOSIS — M5412 Radiculopathy, cervical region: Secondary | ICD-10-CM | POA: Diagnosis not present

## 2019-04-12 DIAGNOSIS — Z7901 Long term (current) use of anticoagulants: Secondary | ICD-10-CM | POA: Diagnosis not present

## 2019-04-12 DIAGNOSIS — E039 Hypothyroidism, unspecified: Secondary | ICD-10-CM | POA: Diagnosis not present

## 2019-04-12 DIAGNOSIS — I2510 Atherosclerotic heart disease of native coronary artery without angina pectoris: Secondary | ICD-10-CM | POA: Diagnosis not present

## 2019-04-12 DIAGNOSIS — I4892 Unspecified atrial flutter: Secondary | ICD-10-CM | POA: Diagnosis not present

## 2019-04-12 DIAGNOSIS — I495 Sick sinus syndrome: Secondary | ICD-10-CM | POA: Diagnosis not present

## 2019-04-12 DIAGNOSIS — M4802 Spinal stenosis, cervical region: Secondary | ICD-10-CM | POA: Diagnosis not present

## 2019-04-12 DIAGNOSIS — Z9889 Other specified postprocedural states: Secondary | ICD-10-CM | POA: Diagnosis not present

## 2019-04-12 DIAGNOSIS — K625 Hemorrhage of anus and rectum: Secondary | ICD-10-CM | POA: Diagnosis not present

## 2019-04-12 DIAGNOSIS — E789 Disorder of lipoprotein metabolism, unspecified: Secondary | ICD-10-CM | POA: Diagnosis not present

## 2019-04-12 DIAGNOSIS — R296 Repeated falls: Secondary | ICD-10-CM | POA: Diagnosis not present

## 2019-04-12 DIAGNOSIS — R739 Hyperglycemia, unspecified: Secondary | ICD-10-CM | POA: Diagnosis not present

## 2019-04-12 DIAGNOSIS — I480 Paroxysmal atrial fibrillation: Secondary | ICD-10-CM | POA: Diagnosis not present

## 2019-04-12 DIAGNOSIS — N183 Chronic kidney disease, stage 3 unspecified: Secondary | ICD-10-CM | POA: Diagnosis not present

## 2019-04-12 DIAGNOSIS — M722 Plantar fascial fibromatosis: Secondary | ICD-10-CM | POA: Diagnosis not present

## 2019-04-12 DIAGNOSIS — Z96651 Presence of right artificial knee joint: Secondary | ICD-10-CM | POA: Diagnosis not present

## 2019-04-14 ENCOUNTER — Ambulatory Visit: Payer: PPO

## 2019-04-15 DIAGNOSIS — Z471 Aftercare following joint replacement surgery: Secondary | ICD-10-CM | POA: Diagnosis not present

## 2019-04-15 DIAGNOSIS — Z96651 Presence of right artificial knee joint: Secondary | ICD-10-CM | POA: Diagnosis not present

## 2019-04-18 ENCOUNTER — Ambulatory Visit: Payer: PPO | Attending: Internal Medicine

## 2019-04-18 ENCOUNTER — Other Ambulatory Visit: Payer: Self-pay

## 2019-04-18 DIAGNOSIS — R600 Localized edema: Secondary | ICD-10-CM | POA: Insufficient documentation

## 2019-04-18 DIAGNOSIS — M25661 Stiffness of right knee, not elsewhere classified: Secondary | ICD-10-CM

## 2019-04-18 DIAGNOSIS — R262 Difficulty in walking, not elsewhere classified: Secondary | ICD-10-CM | POA: Diagnosis not present

## 2019-04-18 DIAGNOSIS — M25561 Pain in right knee: Secondary | ICD-10-CM | POA: Diagnosis not present

## 2019-04-18 NOTE — Therapy (Signed)
[facility] [place], [place], [address] Phone: [phone]   Fax:  [phone]  Physical Therapy Evaluation  Patient Details  Name: Scott [name]. MRN: [id number] Date of Birth: [birth year] Referring Provider (PT): Genevive Bi   Encounter Date: 04/18/2019  PT End of Session - 04/18/19 1207    Visit Number  1    Number of Visits  24    Date for PT Re-Evaluation  07/08/19    Authorization Type  [employer]    Authorization Time Period  progress visit 10       KX visit 15    PT Start Time  1130    PT Stop Time  1215    PT Time Calculation (min)  45 min    Activity Tolerance  Patient tolerated treatment well;Patient limited by pain    Behavior During Therapy  [facility] for tasks assessed/performed       Past Medical History:  Diagnosis Date  . ALLERGIC RHINITIS 01/14/2007  . ANXIETY 09/28/2006  . Asbestos exposure   . Atrial fibrillation ([place]) 09/28/2006   s/p afib ablation x 2  . CHF (congestive heart failure) ([place])   . CKD (chronic kidney disease), stage III    patient denies  . COPD (chronic obstructive pulmonary disease) ([place])   . Coronary artery disease 11/24/2011  . Diastolic dysfunction [id number]  . Dyspnea    W/ EXERTION   . Dysrhythmia    AFIB     . First degree AV block 10/12/2018   Noted on EKG  . Gallstones   . Hyperglycemia   . HYPERLIPIDEMIA 09/28/2006  . HYPERTENSION 09/28/2006  . HYPOTHYROIDISM, ACQUIRED NEC 09/28/2006   pt denies and doesn't know  . Long term current use of anticoagulant 04/08/2010  . Obese   . OBSTRUCTIVE SLEEP APNEA 01/06/2008   NOT USING    . Right knee DJD 07/09/2010  . S/P CABG x 1 11/26/2011   Right internal mammary artery to right coronary artery  . S/P Maze operation for atrial fibrillation 11/26/2011   complete biatrial lesion set with clipping of LA appendage    Past Surgical History:  Procedure Laterality Date  . ANTERIOR CERVICAL DECOMP/DISCECTOMY  FUSION N/A 06/20/2016   Procedure: Anterior Cervical Discectomy Fusion - Cervical five-Cervical six - Cervical six-Cervical seven - Cervicla seven- Thoracic one;  Surgeon: Earnie Larsson, MD;  Location: [place];  Service: Neurosurgery;  Laterality: N/A;  . CARDIOVERSION     X4   . CORONARY ARTERY BYPASS GRAFT  11/26/2011   Procedure: CORONARY ARTERY BYPASS GRAFTING (CABG);  Surgeon: Rexene Alberts, MD;  Location: [place];  Service: Open Heart Surgery;  Laterality: N/A;  coronary artery bypass on pump times one utilizing the right internal mammary artery, transesophageal echocardiogram   . HAND SURGERY Left   . LEFT HEART CATHETERIZATION WITH CORONARY ANGIOGRAM N/A 11/24/2011   Procedure: LEFT HEART CATHETERIZATION WITH CORONARY ANGIOGRAM;  Surgeon: Peter M Martinique, MD;  Location: [facility] CATH LAB;  Service: Cardiovascular;  Laterality: N/A;  . LOOP RECORDER IMPLANT N/A 07/06/2012   Procedure: LOOP RECORDER IMPLANT;  Surgeon: Thompson Grayer, MD;  Location: [facility] CATH LAB;  Service: Cardiovascular;  Laterality: N/A;  . MAZE  11/26/2011   Procedure: MAZE;  Surgeon: Rexene Alberts, MD;  Location: [place];  Service: Open Heart Surgery;  Laterality: N/A;  Cryomaze   . Radiofrequency Ablation for atrial fibrillation  12/30/07 and 06/05/09   afib ablation x 2 by  JA  . Radiofrequency ablation for atrial flutter  2005   CTI  . TOTAL KNEE ARTHROPLASTY Right 04/05/2019   Procedure: RIGHT TOTAL KNEE ARTHROPLASTY;  Surgeon: Melrose Nakayama, MD;  Location: [facility];  Service: Orthopedics;  Laterality: Right;    There were no vitals filed for this visit.   Subjective Assessment - 04/18/19 1141    Subjective  RT TKA due to OA.    04/05/19 was Sx HHPT x 3-4 visits.  Stopped 04/15/19.    He reports doing well but with pain.    Limitations  Walking;Standing;House hold activities    How long can you walk comfortably?  house hold distances    Patient Stated Goals  To walk his dog again,  18000 steps.    Pain Score  5     Pain Location  Knee     Pain Orientation  Right;Anterior;Lateral    Pain Descriptors / Indicators  Aching;Constant    Pain Type  Surgical pain         [facility] PT Assessment - 04/18/19 0001      Assessment   Medical Diagnosis  RT TKA    Referring Provider (PT)  Genevive Bi    Onset Date/Surgical Date  --   04/05/19   Next MD Visit  next week    Prior Therapy  HHPT      Precautions   Precautions  None      Restrictions   Weight Bearing Restrictions  No      Balance Screen   Has the patient fallen in the past 6 months  Yes    How many times?  8-10   walking   Has the patient had a decrease in activity level because of a fear of falling?   Yes    Is the patient reluctant to leave their home because of a fear of falling?   No      Home Environment   Living Environment  Private residence    Living Arrangements  Other relatives    Available Help at Discharge  Family    Type of [place] to enter    Entrance Stairs-Number of Steps  3    Entrance Stairs-Rails  None      Prior Function   Level of Independence  Requires assistive device for independence;Needs assistance with homemaking      Cognition   Overall Cognitive Status  Within Functional Limits for tasks assessed      Observation/Other Assessments-Edema    Edema  Circumferential      Circumferential Edema   Circumferential - Right  49 cm    Circumferential - Left   39 cm      ROM / Strength   AROM / PROM / Strength  AROM;PROM;Strength      AROM   AROM Assessment Site  Knee    Right/Left Knee  Left;Right    Right Knee Extension  -25    Right Knee Flexion  83    Left Knee Extension  -10    Left Knee Flexion  132      PROM   PROM Assessment Site  Knee    Right/Left Knee  Right      Strength   Overall Strength Comments  LT hip flex 5/5   RT 4/5           quad lag on RT     Strength Assessment Site  Knee  Right/Left Knee  Right;Left    Left Knee Flexion  5/5    Left Knee Extension  5/5       Ambulation/Gait   Gait Comments  Decr weight RT leg with RW , uses SPC for home ambulation at times                Objective measurements completed on examination: See above findings.      [facility] Adult PT Treatment/Exercise - 04/18/19 0001      Exercises   Exercises  Knee/Hip      Knee/Hip Exercises: Supine   Short Arc Quad Sets  Right;10 reps    Straight Leg Raises  Right;10 reps    Other Supine Knee/Hip Exercises  DF/PF x 10 RT.       Knee/Hip Exercises: Sidelying   Hip ABduction  Right;10 reps             PT Education - 04/18/19 1149    Education Details  POC  bring HHPT exercises  next appointment    Person(s) Educated  Patient    Methods  Explanation    Comprehension  Verbalized understanding       PT Short Term Goals - 04/18/19 1324      PT SHORT TERM GOAL #1   Title  He will be indpendent with initial hEp    Time  4    Period  Weeks    Status  New      PT SHORT TERM GOAL #2   Title  He will be walking out of home with least restrictive device    Time  4    Period  Weeks    Status  New      PT SHORT TERM GOAL #3   Title  He will walk full time in home with [facility]    Time  4    Period  Weeks    Status  New      PT SHORT TERM GOAL #4   Title  Active RT knee flexion to 100 degrees or better    Time  4    Period  Weeks    Status  New        PT Long Term Goals - 04/18/19 1328      PT LONG TERM GOAL #1   Title  He will be indpendent with all HEP issued    Time  12    Period  Weeks    Status  New      PT LONG TERM GOAL #2   Title  He will walk in home no device and out of home least restricted device    Time  12    Period  Weeks    Status  New      PT LONG TERM GOAL #3   Title  He will be indpendent with all normal home tasks    Time  12    Period  Weeks    Status  New      PT LONG TERM GOAL #4   Title  He will return to walking dog 1/4 mile for exercise    Time  12    Period  Weeks    Status  New      PT LONG TERM GOAL  #5   Title  He will improve flexion to 1120 degrees active and extension to -5-10 degrees to improve gait pattern and  tolerance    Time  12  Period  Weeks    Status  New      Additional Long Term Goals   Additional Long Term Goals  Yes      PT LONG TERM GOAL #6   Title  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   He should do well with skilled PT and consistent HEP    Personal Factors and Comorbidities  Comorbidity 1;Comorbidity 2;Transportation    Comorbidities  CABG, ACDF    Examination-Activity Limitations  Locomotion Level;Squat;Stairs;Stand    Examination-Participation Restrictions  Community Activity;Meal Prep;Cleaning;Laundry;Yard Work    Stability/Clinical Decision Making  Evolving/Moderate complexity    Clinical Decision Making  Moderate    Rehab Potential  Good    PT Frequency  2x / week    PT Duration  12 weeks   if progress slow over first 3 weeks will increase to 3x/week   PT Treatment/Interventions  Taping;Manual techniques;Passive range of motion;Patient/family education;Therapeutic exercise;Therapeutic activities;Stair training;Gait training;Vasopneumatic Device    Consulted and Agree with Plan of Care  Patient       Patient will benefit from skilled therapeutic intervention in order to improve the following deficits and impairments:  Pain, Difficulty walking, Decreased range of motion, Decreased balance, Decreased strength, Increased edema  Visit Diagnosis: Acute pain of right knee  Localized edema  Difficulty in walking, not elsewhere classified     Problem List Patient Active Problem List   Diagnosis Date Noted  . Primary osteoarthritis of right knee 04/05/2019  . CKD (chronic kidney disease) stage 3, GFR  30-59 ml/min 03/14/2019  . Degenerative joint disease of knee, right 09/27/2018  . Dyspnea 09/06/2018  . Right knee pain 09/06/2018  . Radiculitis of left cervical region 03/05/2018  . Plantar fasciitis, left 08/19/2017  . Chest pain 02/27/2017  . Right ear pain 02/27/2017  . Left groin pain 09/13/2016  . Rib pain on left side 09/13/2016  . Left leg pain 09/13/2016  . Stenosis of cervical spine with myelopathy ([place]) 06/20/2016  . Special screening for malignant neoplasms, colon 04/04/2016  . Cough 01/11/2016  . COPD exacerbation ([place]) 12/22/2015  . Rectal bleeding 08/14/2014  . Intercostal muscle strain 08/07/2014  . Chronic meniscal tear of knee 04/06/2014  . Heel bone fracture 04/06/2014  . Hyperglycemia 04/04/2014  . Recurrent falls 04/04/2014  . Bilateral knee pain 04/04/2014  . Intractable left heel pain 04/04/2014  . Insomnia 04/04/2014  . Hematochezia 04/04/2014  . Bladder neck obstruction 12/19/2013  . Encounter for therapeutic drug monitoring 03/30/2013  . Palpitations 07/06/2012  . Shortness of breath 01/19/2012  . Sick sinus syndrome ([place]) 12/01/2011  . S/P CABG x 1 11/26/2011  . S/P Maze operation for atrial fibrillation 11/26/2011  . CAD (coronary artery disease) 11/25/2011  . Paroxysmal atrial fibrillation ([place]) 11/24/2011  . Chronic combined systolic (congestive) and diastolic (congestive) heart failure ([place]) 11/24/2011  . Gross hematuria 12/12/2010  . Left knee pain 12/12/2010  . Left knee DJD 07/09/2010  . Preventative health care 07/07/2010  . Chronic anticoagulation 04/08/2010  . COPD (chronic obstructive pulmonary disease) ([place]) 10/04/2008  . OBSTRUCTIVE SLEEP APNEA 01/06/2008  . ALLERGIC RHINITIS 01/14/2007  .  Hypothyroidism 09/28/2006  . Hyperlipidemia 09/28/2006  . Anxiety state 09/28/2006  . Essential hypertension 09/28/2006    Darrel Hoover  PT 04/18/2019, 1:31 PM  [facility] [address] [place], [place], [address] Phone: [phone]   Fax:  [phone]  Name: Tyrice Queener. MRN: [id number] Date of Birth: 1955/07/29

## 2019-04-19 ENCOUNTER — Other Ambulatory Visit: Payer: Self-pay

## 2019-04-19 MED ORDER — APIXABAN 5 MG PO TABS: 5.0000 mg | ORAL_TABLET | Freq: Two times a day (BID) | ORAL | 1 refills | Status: DC

## 2019-04-21 ENCOUNTER — Other Ambulatory Visit: Payer: Self-pay

## 2019-04-21 ENCOUNTER — Ambulatory Visit: Payer: PPO

## 2019-04-21 DIAGNOSIS — M25661 Stiffness of right knee, not elsewhere classified: Secondary | ICD-10-CM

## 2019-04-21 DIAGNOSIS — R600 Localized edema: Secondary | ICD-10-CM

## 2019-04-21 DIAGNOSIS — M25561 Pain in right knee: Secondary | ICD-10-CM | POA: Diagnosis not present

## 2019-04-21 DIAGNOSIS — R262 Difficulty in walking, not elsewhere classified: Secondary | ICD-10-CM

## 2019-04-21 NOTE — Therapy (Signed)
[place], [place], [address] Phone: [phone]   Fax:  [phone]  Physical Therapy Treatment  Patient Details  Name: Scott [name]. MRN: [id number] Date of Birth: 1955/07/01 Referring Provider (PT): Genevive Bi   Encounter Date: 04/21/2019  PT End of Session - 04/21/19 1048    Visit Number  22    Number of Visits  24    Date for PT Re-Evaluation  07/08/19    Authorization Type  Health team advantage    Authorization Time Period  progress visit 10       KX visit 15    PT Start Time  1000    PT Stop Time  1100    PT Time Calculation (min)  60 min    Activity Tolerance  Patient tolerated treatment well    Behavior During Therapy  WFL for tasks assessed/performed       Past Medical History:  Diagnosis Date  . ALLERGIC RHINITIS 01/14/2007  . ANXIETY 09/28/2006  . Asbestos exposure   . Atrial fibrillation ([place]) 09/28/2006   s/p afib ablation x 2  . CHF (congestive heart failure) ([place])   . CKD (chronic kidney disease), stage III    patient denies  . COPD (chronic obstructive pulmonary disease) ([place])   . Coronary artery disease 11/24/2011  . Diastolic dysfunction 99991111  . Dyspnea    W/ EXERTION   . Dysrhythmia    AFIB     . First degree AV block 10/12/2018   Noted on EKG  . Gallstones   . Hyperglycemia   . HYPERLIPIDEMIA 09/28/2006  . HYPERTENSION 09/28/2006  . HYPOTHYROIDISM, ACQUIRED NEC 09/28/2006   pt denies and doesn't know  . Long term current use of anticoagulant 04/08/2010  . Obese   . OBSTRUCTIVE SLEEP APNEA 01/06/2008   NOT USING    . Right knee DJD 07/09/2010  . S/P CABG x 1 11/26/2011   Right internal mammary artery to right coronary artery  . S/P Maze operation for atrial fibrillation 11/26/2011   complete biatrial lesion set with clipping of LA appendage    Past Surgical History:  Procedure Laterality Date  . ANTERIOR CERVICAL DECOMP/DISCECTOMY FUSION N/A 06/20/2016    Procedure: Anterior Cervical Discectomy Fusion - Cervical five-Cervical six - Cervical six-Cervical seven - Cervicla seven- Thoracic one;  Surgeon: Earnie Larsson, MD;  Location: [facility];  Service: Neurosurgery;  Laterality: N/A;  . CARDIOVERSION     X4   . CORONARY ARTERY BYPASS GRAFT  11/26/2011   Procedure: CORONARY ARTERY BYPASS GRAFTING (CABG);  Surgeon: Rexene Alberts, MD;  Location: [facility];  Service: Open Heart Surgery;  Laterality: N/A;  coronary artery bypass on pump times one utilizing the right internal mammary artery, transesophageal echocardiogram   . HAND SURGERY Left   . LEFT HEART CATHETERIZATION WITH CORONARY ANGIOGRAM N/A 11/24/2011   Procedure: LEFT HEART CATHETERIZATION WITH CORONARY ANGIOGRAM;  Surgeon: Peter M Martinique, MD;  Location[facility] C[facility] LAB;  Service: Cardiovascular;  Laterality: N/A;  . LOOP RECORDER IMPLANT N/A 07/06/2012   Procedure: LOOP RECORDER IMPLANT;  Surgeon: Thompson Grayer, MD;  Location: [facility] CATH LAB;  Service: Cardiovascular;  Laterality: N/A;  . MAZE  11/26/2011   Procedure: MAZE;  Surgeon: Rexene Alberts, MD;  Location: [facility];  Service: Open Heart Surgery;  Laterality: N/A;  Cryomaze   . Radiofrequency Ablation for atrial fibrillation  12/30/07 and 06/05/09   afib ablation x 2 by JA  .  Radiofrequency ablation for atrial flutter  2005   CTI  . TOTAL KNEE ARTHROPLASTY Right 04/05/2019   Procedure: RIGHT TOTAL KNEE ARTHROPLASTY;  Surgeon: Melrose Nakayama, MD;  [facility];  Service: Orthopedics;  Laterality: Right;    There were no vitals filed for this visit.  Subjective Assessment - 04/21/19 1054    Subjective  No problems since last session    Pain Score  5     Pain Location  Knee    Pain Orientation  Right;Anterior;Lateral    Pain Descriptors / Indicators  Aching;Constant    Pain Type  Surgical pain         [facility] PT Assessment - 04/21/19 0001      AROM   Right Knee Flexion  90                   [facility] Adult PT Treatment/Exercise -  04/21/19 0001      Knee/Hip Exercises: Standing   Heel Raises  Both;20 reps    Knee Flexion  Right;Left;15 reps    Hip Flexion  Right;Left;15 reps    Hip Abduction  Right;Left;15 reps    Functional Squat  15 reps    Functional Squat Limitations  a t freemotion    Other Standing Knee Exercises  face wall reach LT and RT arm with weight shift to RT cued to contract quad and gluteals      Knee/Hip Exercises: Supine   Quad Sets  Right;10 reps    Quad Sets Limitations  5 sec     Straight Leg Raises  Right;15 reps      Knee/Hip Exercises: Sidelying   Hip ABduction  Right;15 reps      Modalities   Modalities  Vasopneumatic      Vasopneumatic   Number Minutes Vasopneumatic   15 minutes    Vasopnuematic Location   Knee    Vasopneumatic Pressure  Medium    Vasopneumatic Temperature   34 degrees      Manual Therapy   Manual Therapy  Joint mobilization    Joint Mobilization  AP glides Gr 3 tivbia for flexion, patella mobs all directions               PT Short Term Goals - 04/18/19 1324      PT SHORT TERM GOAL #1   Title  He will be indpendent with initial hEp    Time  4    Period  Weeks    Status  New      PT SHORT TERM GOAL #2   Title  He will be walking out of home with least restrictive device    Time  4    Period  Weeks    Status  New      PT SHORT TERM GOAL #3   Title  He will walk full time in home with [facility]    Time  4    Period  Weeks    Status  New      PT SHORT TERM GOAL #4   Title  Active RT knee flexion to 100 degrees or better    Time  4    Period  Weeks    Status  New        PT Long Term Goals - 04/18/19 1328      PT LONG TERM GOAL #1   Title  He will be indpendent with all HEP issued    Time  12  Period  Weeks    Status  New      PT LONG TERM GOAL #2   Title  He will walk in home no device and out of home least restricted device    Time  12    Period  Weeks    Status  New      PT LONG TERM GOAL #3   Title  He will be indpendent  with all normal home tasks    Time  12    Period  Weeks    Status  New      PT LONG TERM GOAL #4   Title  He will return to walking dog 1/4 mile for exercise    Time  12    Period  Weeks    Status  New      PT LONG TERM GOAL #5   Title  He will improve flexion to 1120 degrees active and extension to -5-10 degrees to improve gait pattern and  tolerance    Time  12    Period  Weeks    Status  New      Additional Long Term Goals   Additional Long Term Goals  Yes      PT LONG TERM GOAL #6   Title  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 ROM improved .   Will try SPC next session.  All HEP reviewed and modified with incr rep or position change with hip abduction    PT Treatment/Interventions  Taping;Manual techniques;Passive range of motion;Patient/family education;Therapeutic exercise;Therapeutic activities;Stair training;Gait training;Vasopneumatic Device    PT Next Visit Plan  contniue strength , vaso, manial,  SPC gait    PT Home Exercise Plan  All HEP reviewed and pt independent    Consulted and Agree with Plan of Care  Patient       Patient will benefit from skilled therapeutic intervention in order to improve the following deficits and impairments:  Pain, Difficulty walking, Decreased range of motion, Decreased balance, Decreased strength, Increased edema  Visit Diagnosis: Acute pain of right knee  Localized edema  Difficulty in walking, not elsewhere classified  Stiffness of right knee, not elsewhere classified     Problem List Patient Active Problem List   Diagnosis Date Noted  . Primary osteoarthritis of right knee 04/05/2019  . CKD (chronic kidney disease) stage 3, GFR 30-59 ml/min 03/14/2019  . Degenerative joint disease of knee, right 09/27/2018  . Dyspnea 09/06/2018  .  Right knee pain 09/06/2018  . Radiculitis of left cervical region 03/05/2018  . Plantar fasciitis, left 08/19/2017  . Chest pain 02/27/2017  . Right ear pain 02/27/2017  . Left groin pain 09/13/2016  . Rib pain on left side 09/13/2016  . Left leg pain 09/13/2016  . Stenosis of cervical spine with myelopathy ([place]) 06/20/2016  . Special screening for malignant neoplasms, colon 04/04/2016  . Cough 01/11/2016  . COPD exacerbation ([place]) 12/22/2015  . Rectal bleeding 08/14/2014  . Intercostal muscle strain 08/07/2014  . Chronic meniscal tear of knee 04/06/2014  . Heel bone fracture 04/06/2014  . Hyperglycemia 04/04/2014  . Recurrent falls 04/04/2014  . Bilateral knee pain 04/04/2014  . Intractable left heel pain 04/04/2014  .  Insomnia 04/04/2014  . Hematochezia 04/04/2014  . Bladder neck obstruction 12/19/2013  . Encounter for therapeutic drug monitoring 03/30/2013  . Palpitations 07/06/2012  . Shortness of breath 01/19/2012  . Sick sinus syndrome ([place]) 12/01/2011  . S/P CABG x 1 11/26/2011  . S/P Maze operation for atrial fibrillation 11/26/2011  . CAD (coronary artery disease) 11/25/2011  . Paroxysmal atrial fibrillation ([place]) 11/24/2011  . Chronic combined systolic (congestive) and diastolic (congestive) heart failure ([place]) 11/24/2011  . Gross hematuria 12/12/2010  . Left knee pain 12/12/2010  . Left knee DJD 07/09/2010  . Preventative health care 07/07/2010  . Chronic anticoagulation 04/08/2010  . COPD (chronic obstructive pulmonary disease) ([place]) 10/04/2008  . OBSTRUCTIVE SLEEP APNEA 01/06/2008  . ALLERGIC RHINITIS 01/14/2007  . Hypothyroidism 09/28/2006  . Hyperlipidemia 09/28/2006  . Anxiety state 09/28/2006  . Essential hypertension 09/28/2006    Darrel Hoover  PT 04/21/2019, 10:55 AM  [facility] [address] [place], [place], [address] Phone: [phone]   Fax:  [phone]  Name: Arne Massengill. MRN: [id number] Date of Birth: [birth year]

## 2019-04-25 ENCOUNTER — Other Ambulatory Visit: Payer: Self-pay

## 2019-04-25 ENCOUNTER — Encounter: Payer: Self-pay | Admitting: Physical Therapy

## 2019-04-25 ENCOUNTER — Ambulatory Visit: Payer: PPO | Attending: Internal Medicine | Admitting: Physical Therapy

## 2019-04-25 DIAGNOSIS — M25661 Stiffness of right knee, not elsewhere classified: Secondary | ICD-10-CM | POA: Diagnosis not present

## 2019-04-25 DIAGNOSIS — R600 Localized edema: Secondary | ICD-10-CM | POA: Diagnosis not present

## 2019-04-25 DIAGNOSIS — R262 Difficulty in walking, not elsewhere classified: Secondary | ICD-10-CM

## 2019-04-25 DIAGNOSIS — M25561 Pain in right knee: Secondary | ICD-10-CM | POA: Insufficient documentation

## 2019-04-25 NOTE — Therapy (Signed)
[place], [place], [address] Phone: [phone]   Fax:  [phone]  Physical Therapy Treatment  Patient Details  Name: Scott [name]. MRN: [id number] Date of Birth: 10-10-1955 Referring Provider (PT): Genevive Bi   Encounter Date: 04/25/2019  PT End of Session - 04/25/19 1128    Visit Number  3    Number of Visits  24    Date for PT Re-Evaluation  07/08/19    Authorization Type  Health team advantage    Authorization Time Period  progress visit 10       KX visit 15    PT Start Time  [id number]    PT Stop Time  1110    PT Time Calculation (min)  55 min       Past Medical History:  Diagnosis Date  . ALLERGIC RHINITIS 01/14/2007  . ANXIETY 09/28/2006  . Asbestos exposure   . Atrial fibrillation ([place]) 09/28/2006   s/p afib ablation x 2  . CHF (congestive heart failure) ([place])   . CKD (chronic kidney disease), stage III    patient denies  . COPD (chronic obstructive pulmonary disease) ([place])   . Coronary artery disease 11/24/2011  . Diastolic dysfunction 99991111  . Dyspnea    W/ EXERTION   . Dysrhythmia    AFIB     . First degree AV block 10/12/2018   Noted on EKG  . Gallstones   . Hyperglycemia   . HYPERLIPIDEMIA 09/28/2006  . HYPERTENSION 09/28/2006  . HYPOTHYROIDISM, ACQUIRED NEC 09/28/2006   pt denies and doesn't know  . Long term current use of anticoagulant 04/08/2010  . Obese   . OBSTRUCTIVE SLEEP APNEA 01/06/2008   NOT USING    . Right knee DJD 07/09/2010  . S/P CABG x 1 11/26/2011   Right internal mammary artery to right coronary artery  . S/P Maze operation for atrial fibrillation 11/26/2011   complete biatrial lesion set with clipping of LA appendage    Past Surgical History:  Procedure Laterality Date  . ANTERIOR CERVICAL DECOMP/DISCECTOMY FUSION N/A 06/20/2016   Procedure: Anterior Cervical Discectomy Fusion - Cervical five-Cervical six - Cervical six-Cervical seven - Cervicla  seven- Thoracic one;  Surgeon: Earnie Larsson, MD;  Location: [facility];  Service: Neurosurgery;  Laterality: N/A;  . CARDIOVERSION     X4   . CORONARY ARTERY BYPASS GRAFT  11/26/2011   Procedure: CORONARY ARTERY BYPASS GRAFTING (CABG);  Surgeon: Rexene Alberts, MD;  Location: [place] [facility];  Service: Open Heart Surgery;  Laterality: N/A;  coronary artery bypass on pump times one utilizing the right internal mammary artery, transesophageal echocardiogram   . HAND SURGERY Left   . LEFT HEART CATHETERIZATION WITH CORONARY ANGIOGRAM N/A 11/24/2011   Procedure: LEFT HEART CATHETERIZATION WITH CORONARY ANGIOGRAM;  Surgeon: Peter M Martinique, MD;  Location: [facility] CATH LAB;  Service: Cardiovascular;  Laterality: N/A;  . LOOP RECORDER IMPLANT N/A 07/06/2012   Procedure: LOOP RECORDER IMPLANT;  Surgeon: Thompson Grayer, MD;  Location: [facility] CATH LAB;  Service: Cardiovascular;  Laterality: N/A;  . MAZE  11/26/2011   Procedure: MAZE;  Surgeon: Rexene Alberts, MD;  Location: [facility];  Service: Open Heart Surgery;  Laterality: N/A;  Cryomaze   . Radiofrequency Ablation for atrial fibrillation  12/30/07 and 06/05/09   afib ablation x 2 by JA  . Radiofrequency ablation for atrial flutter  2005   CTI  . TOTAL KNEE ARTHROPLASTY Right 04/05/2019   Procedure: RIGHT  TOTAL KNEE ARTHROPLASTY;  Surgeon: Melrose Nakayama, MD;  [facility];  Service: Orthopedics;  Laterality: Right;    There were no vitals filed for this visit.  Subjective Assessment - 04/25/19 1027    Subjective  /I walked up the hill this weekend. I walk alot. The knee is a .         [facility] PT Assessment - 04/25/19 0001      AROM   Right Knee Flexion  90   AAROM 98 with strap                   [facility] Adult PT Treatment/Exercise - 04/25/19 0001      Ambulation/Gait   Gait Comments  gait training with SPC in clinic today. Good technique , safe      Knee/Hip Exercises: Stretches   Active Hamstring Stretch  2 reps;30 seconds    Active Hamstring Stretch  Limitations  strap supine      Knee/Hip Exercises: Aerobic   Nustep  L3 UE/LE x 5 minutes       Knee/Hip Exercises: Seated   Other Seated Knee/Hip Exercises  knee flexion with overpressure from opposite LE 10 sec x 3       Knee/Hip Exercises: Supine   Quad Sets  Right;10 reps    Short Arc Quad Sets  Right;2 sets;10 reps    Heel Slides Limitations  3 reps with strap assist     Straight Leg Raises  10 reps;2 sets      Knee/Hip Exercises: Sidelying   Hip ABduction  Right;10 reps;2 sets      Vasopneumatic   Number Minutes Vasopneumatic   15 minutes    Vasopnuematic Location   Knee    Vasopneumatic Pressure  Medium    Vasopneumatic Temperature   34 degrees               PT Short Term Goals - 04/18/19 1324      PT SHORT TERM GOAL #1   Title  He will be indpendent with initial hEp    Time  4    Period  Weeks    Status  New      PT SHORT TERM GOAL #2   Title  He will be walking out of home with least restrictive device    Time  4    Period  Weeks    Status  New      PT SHORT TERM GOAL #3   Title  He will walk full time in home with [facility]    Time  4    Period  Weeks    Status  New      PT SHORT TERM GOAL #4   Title  Active RT knee flexion to 100 degrees or better    Time  4    Period  Weeks    Status  New        PT Long Term Goals - 04/18/19 1328      PT LONG TERM GOAL #1   Title  He will be indpendent with all HEP issued    Time  12    Period  Weeks    Status  New      PT LONG TERM GOAL #2   Title  He will walk in home no device and out of home least restricted device    Time  12    Period  Weeks    Status  New  PT LONG TERM GOAL #3   Title  He will be indpendent with all normal home tasks    Time  12    Period  Weeks    Status  New      PT LONG TERM GOAL #4   Title  He will return to walking dog 1/4 mile for exercise    Time  12    Period  Weeks    Status  New      PT LONG TERM GOAL #5   Title  He will improve flexion to 1120 degrees  active and extension to -5-10 degrees to improve gait pattern and  tolerance    Time  12    Period  Weeks    Status  New      Additional Long Term Goals   Additional Long Term Goals  Yes      PT LONG TERM GOAL #6   Title  He will return to work at [employer]    Time  12    Period  Weeks    Status  New            Plan - 04/25/19 1150    Clinical Impression Statement  Pt brought home health HEP for review. His ROM is improved to 98 AAROM knee flexion.    PT Next Visit Plan  contniue strength , vaso, manual,  SPC gait    PT Home Exercise Plan  All HEP reviewed and pt independent (HHPT HEP quad set, SAQ, LAQ, assisted knee flexion in sitting, heel slides SLR, side hip abduction)       Patient will benefit from skilled therapeutic intervention in order to improve the following deficits and impairments:  Pain, Difficulty walking, Decreased range of motion, Decreased balance, Decreased strength, Increased edema  Visit Diagnosis: Acute pain of right knee  Localized edema  Difficulty in walking, not elsewhere classified  Stiffness of right knee, not elsewhere classified     Problem List Patient Active Problem List   Diagnosis Date Noted  . Primary osteoarthritis of right knee 04/05/2019  . CKD (chronic kidney disease) stage 3, GFR 30-59 ml/min 03/14/2019  . Degenerative joint disease of knee, right 09/27/2018  . Dyspnea 09/06/2018  . Right knee pain 09/06/2018  . Radiculitis of left cervical region 03/05/2018  . Plantar fasciitis, left 08/19/2017  . Chest pain 02/27/2017  . Right ear pain 02/27/2017  . Left groin pain 09/13/2016  . Rib pain on left side 09/13/2016  . Left leg pain 09/13/2016  . Stenosis of cervical spine with myelopathy ([place]) 06/20/2016  . Special screening for malignant neoplasms, colon 04/04/2016  . Cough 01/11/2016  . COPD exacerbation ([place]) 12/22/2015  . Rectal bleeding 08/14/2014  . Intercostal muscle strain 08/07/2014  . Chronic meniscal  tear of knee 04/06/2014  . Heel bone fracture 04/06/2014  . Hyperglycemia 04/04/2014  . Recurrent falls 04/04/2014  . Bilateral knee pain 04/04/2014  . Intractable left heel pain 04/04/2014  . Insomnia 04/04/2014  . Hematochezia 04/04/2014  . Bladder neck obstruction 12/19/2013  . Encounter for therapeutic drug monitoring 03/30/2013  . Palpitations 07/06/2012  . Shortness of breath 01/19/2012  . Sick sinus syndrome ([place]) 12/01/2011  . S/P CABG x 1 11/26/2011  . S/P Maze operation for atrial fibrillation 11/26/2011  . CAD (coronary artery disease) 11/25/2011  . Paroxysmal atrial fibrillation ([place]) 11/24/2011  . Chronic combined systolic (congestive) and diastolic (congestive) heart failure ([place]) 11/24/2011  . Gross hematuria 12/12/2010  .  Left knee pain 12/12/2010  . Left knee DJD 07/09/2010  . Preventative health care 07/07/2010  . Chronic anticoagulation 04/08/2010  . COPD (chronic obstructive pulmonary disease) ([place]) 10/04/2008  . OBSTRUCTIVE SLEEP APNEA 01/06/2008  . ALLERGIC RHINITIS 01/14/2007  . Hypothyroidism 09/28/2006  . Hyperlipidemia 09/28/2006  . Anxiety state 09/28/2006  . Essential hypertension 09/28/2006    Dorene Ar, PTA 04/25/2019, 12:01 PM  [facility] [address] [place], [place], [address] Phone: [phone]   Fax:  [phone]  Name: Charvis Gholar. MRN: [id number] Date of Birth: November 20, 1955

## 2019-04-26 DIAGNOSIS — G4733 Obstructive sleep apnea (adult) (pediatric): Secondary | ICD-10-CM | POA: Diagnosis not present

## 2019-04-27 ENCOUNTER — Ambulatory Visit: Payer: PPO | Admitting: Physical Therapy

## 2019-04-27 ENCOUNTER — Other Ambulatory Visit: Payer: Self-pay

## 2019-04-27 ENCOUNTER — Encounter: Payer: Self-pay | Admitting: Physical Therapy

## 2019-04-27 DIAGNOSIS — M25561 Pain in right knee: Secondary | ICD-10-CM | POA: Diagnosis not present

## 2019-04-27 DIAGNOSIS — R262 Difficulty in walking, not elsewhere classified: Secondary | ICD-10-CM

## 2019-04-27 DIAGNOSIS — R600 Localized edema: Secondary | ICD-10-CM

## 2019-04-27 DIAGNOSIS — M25661 Stiffness of right knee, not elsewhere classified: Secondary | ICD-10-CM

## 2019-04-27 NOTE — Therapy (Signed)
[place], [place], [address] Phone: [phone]   Fax:  [phone]  Physical Therapy Treatment  Patient Details  Name: Scott [name]. MRN: [id number] Date of Birth: 1956-02-08 Referring Provider (PT): Genevive Bi   Encounter Date: 04/27/2019  PT End of Session - 04/27/19 0847    Visit Number  4    Number of Visits  24    Date for PT Re-Evaluation  07/08/19    Authorization Type  Health team advantage    Authorization Time Period  progress visit 10       KX visit 15    PT Start Time  0845    PT Stop Time  0940    PT Time Calculation (min)  55 min       Past Medical History:  Diagnosis Date  . ALLERGIC RHINITIS 01/14/2007  . ANXIETY 09/28/2006  . Asbestos exposure   . Atrial fibrillation ([place]) 09/28/2006   s/p afib ablation x 2  . CHF (congestive heart failure) ([place])   . CKD (chronic kidney disease), stage III    patient denies  . COPD (chronic obstructive pulmonary disease) ([place])   . Coronary artery disease 11/24/2011  . Diastolic dysfunction 99991111  . Dyspnea    W/ EXERTION   . Dysrhythmia    AFIB     . First degree AV block 10/12/2018   Noted on EKG  . Gallstones   . Hyperglycemia   . HYPERLIPIDEMIA 09/28/2006  . HYPERTENSION 09/28/2006  . HYPOTHYROIDISM, ACQUIRED NEC 09/28/2006   pt denies and doesn't know  . Long term current use of anticoagulant 04/08/2010  . Obese   . OBSTRUCTIVE SLEEP APNEA 01/06/2008   NOT USING    . Right knee DJD 07/09/2010  . S/P CABG x 1 11/26/2011   Right internal mammary artery to right coronary artery  . S/P Maze operation for atrial fibrillation 11/26/2011   complete biatrial lesion set with clipping of LA appendage    Past Surgical History:  Procedure Laterality Date  . ANTERIOR CERVICAL DECOMP/DISCECTOMY FUSION N/A 06/20/2016   Procedure: Anterior Cervical Discectomy Fusion - Cervical five-Cervical six - Cervical six-Cervical seven - Cervicla  seven- Thoracic one;  Surgeon: Earnie Larsson, MD;  Location: [facility];  Service: Neurosurgery;  Laterality: N/A;  . CARDIOVERSION     X4   . CORONARY ARTERY BYPASS GRAFT  11/26/2011   Procedure: CORONARY ARTERY BYPASS GRAFTING (CABG);  Surgeon: Rexene Alberts, MD;  Location: [facility];  Service: Open Heart Surgery;  Laterality: N/A;  coronary artery bypass on pump times one utilizing the right internal mammary artery, transesophageal echocardiogram   . HAND SURGERY Left   . LEFT HEART CATHETERIZATION WITH CORONARY ANGIOGRAM N/A 11/24/2011   Procedure: LEFT HEART CATHETERIZATION WITH CORONARY ANGIOGRAM;  Surgeon: Peter M Martinique, MD;  Location: [facility] CATH LAB;  Service: Cardiovascular;  Laterality: N/A;  . LOOP RECORDER IMPLANT N/A 07/06/2012   Procedure: LOOP RECORDER IMPLANT;  Surgeon: Thompson Grayer, MD;  Location: [facility] CATH LAB;  Service: Cardiovascular;  Laterality: N/A;  . MAZE  11/26/2011   Procedure: MAZE;  Surgeon: Rexene Alberts, MD;  Location: [facility];  Service: Open Heart Surgery;  Laterality: N/A;  Cryomaze   . Radiofrequency Ablation for atrial fibrillation  12/30/07 and 06/05/09   afib ablation x 2 by JA  . Radiofrequency ablation for atrial flutter  2005   CTI  . TOTAL KNEE ARTHROPLASTY Right 04/05/2019   Procedure: RIGHT  TOTAL KNEE ARTHROPLASTY;  Surgeon: Melrose Nakayama, MD;  Location: [facility];  Service: Orthopedics;  Laterality: Right;    There were no vitals filed for this visit.  Subjective Assessment - 04/27/19 0846    Subjective  I feel like I have more movement in the knee.    Currently in Pain?  Yes    Pain Score  5     Pain Location  Knee    Pain Orientation  Right;Anterior;Lateral    Pain Descriptors / Indicators  Aching    Pain Type  Surgical pain    Aggravating Factors   standing    Pain Relieving Factors  keep moving         [facility] PT Assessment - 04/27/19 0001      PROM   Right Knee Flexion  103                   [facility] Adult PT Treatment/Exercise - 04/27/19  0001      Knee/Hip Exercises: Stretches   Active Hamstring Stretch  2 reps;30 seconds    Active Hamstring Stretch Limitations  strap supine and seated       Knee/Hip Exercises: Aerobic   Nustep  L4 UE/LE x 6 minutes       Knee/Hip Exercises: Standing   Heel Raises  Both;20 reps    Knee Flexion  Right;Left;15 reps    Hip Flexion  Right;Left;15 reps    Hip Flexion Limitations  alternating     Hip Abduction  Right;Left;15 reps      Knee/Hip Exercises: Seated   Long Arc Quad  20 reps    Other Seated Knee/Hip Exercises  knee flexion with overpressure from opposite LE 10 sec x 3       Knee/Hip Exercises: Supine   Quad Sets  20 reps    Quad Sets Limitations  heel prop and tactile cues     Short Arc Quad Sets  Right;2 sets;10 reps    Heel Slides Limitations  3 reps with strap assist     Straight Leg Raises  10 reps;2 sets      Knee/Hip Exercises: Sidelying   Hip ABduction  Right;10 reps;2 sets      Vasopneumatic   Number Minutes Vasopneumatic   15 minutes    Vasopnuematic Location   Knee    Vasopneumatic Pressure  Medium    Vasopneumatic Temperature   34 degrees      Manual Therapy   Joint Mobilization  Ap glides for knee flexion followed by PROm, patella mobs all planes                PT Short Term Goals - 04/18/19 1324      PT SHORT TERM GOAL #1   Title  He will be indpendent with initial hEp    Time  4    Period  Weeks    Status  New      PT SHORT TERM GOAL #2   Title  He will be walking out of home with least restrictive device    Time  4    Period  Weeks    Status  New      PT SHORT TERM GOAL #3   Title  He will walk full time in home with [facility]    Time  4    Period  Weeks    Status  New      PT SHORT TERM GOAL #4   Title  Active  RT knee flexion to 100 degrees or better    Time  4    Period  Weeks    Status  New        PT Long Term Goals - 04/18/19 1328      PT LONG TERM GOAL #1   Title  He will be indpendent with all HEP issued    Time  12     Period  Weeks    Status  New      PT LONG TERM GOAL #2   Title  He will walk in home no device and out of home least restricted device    Time  12    Period  Weeks    Status  New      PT LONG TERM GOAL #3   Title  He will be indpendent with all normal home tasks    Time  12    Period  Weeks    Status  New      PT LONG TERM GOAL #4   Title  He will return to walking dog 1/4 mile for exercise    Time  12    Period  Weeks    Status  New      PT LONG TERM GOAL #5   Title  He will improve flexion to 1120 degrees active and extension to -5-10 degrees to improve gait pattern and  tolerance    Time  12    Period  Weeks    Status  New      Additional Long Term Goals   Additional Long Term Goals  Yes      PT LONG TERM GOAL #6   Title  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 He does note continued swelling, Education provided on the need to elevate his leg when icing and increase frequency at home. PROM improved.    PT Next Visit Plan  contniue strength , vaso, manual,  SPC gait    PT Home Exercise Plan  All HEP reviewed and pt independent (HHPT HEP quad set, SAQ, LAQ, assisted knee flexion in sitting, heel slides SLR, side hip abduction)       Patient will benefit from skilled therapeutic intervention in order to improve the following deficits and impairments:  Pain, Difficulty walking, Decreased range of motion, Decreased balance, Decreased strength, Increased edema  Visit Diagnosis: Acute pain of right knee  Localized edema  Difficulty in walking, not elsewhere classified  Stiffness of right knee, not elsewhere classified     Problem List Patient Active Problem List   Diagnosis Date Noted  . Primary osteoarthritis of right knee 04/05/2019  . CKD (chronic kidney disease) stage 3, GFR 30-59 ml/min  03/14/2019  . Degenerative joint disease of knee, right 09/27/2018  . Dyspnea 09/06/2018  . Right knee pain 09/06/2018  . Radiculitis of left cervical region 03/05/2018  . Plantar fasciitis, left 08/19/2017  . Chest pain 02/27/2017  . Right ear pain 02/27/2017  . Left groin pain 09/13/2016  . Rib pain on left side 09/13/2016  . Left leg pain 09/13/2016  . Stenosis of cervical spine with myelopathy ([place]) 06/20/2016  . Special screening for malignant neoplasms, colon 04/04/2016  .  Cough 01/11/2016  . COPD exacerbation ([place]) 12/22/2015  . Rectal bleeding 08/14/2014  . Intercostal muscle strain 08/07/2014  . Chronic meniscal tear of knee 04/06/2014  . Heel bone fracture 04/06/2014  . Hyperglycemia 04/04/2014  . Recurrent falls 04/04/2014  . Bilateral knee pain 04/04/2014  . Intractable left heel pain 04/04/2014  . Insomnia 04/04/2014  . Hematochezia 04/04/2014  . Bladder neck obstruction 12/19/2013  . Encounter for therapeutic drug monitoring 03/30/2013  . Palpitations 07/06/2012  . Shortness of breath 01/19/2012  . Sick sinus syndrome ([place]) 12/01/2011  . S/P CABG x 1 11/26/2011  . S/P Maze operation for atrial fibrillation 11/26/2011  . CAD (coronary artery disease) 11/25/2011  . Paroxysmal atrial fibrillation ([place]) 11/24/2011  . Chronic combined systolic (congestive) and diastolic (congestive) heart failure ([place]) 11/24/2011  . Gross hematuria 12/12/2010  . Left knee pain 12/12/2010  . Left knee DJD 07/09/2010  . Preventative health care 07/07/2010  . Chronic anticoagulation 04/08/2010  . COPD (chronic obstructive pulmonary disease) ([place]) 10/04/2008  . OBSTRUCTIVE SLEEP APNEA 01/06/2008  . ALLERGIC RHINITIS 01/14/2007  . Hypothyroidism 09/28/2006  . Hyperlipidemia 09/28/2006  . Anxiety state 09/28/2006  . Essential hypertension 09/28/2006    Dorene Ar, PTA 04/27/2019, 9:50 AM  [facility] [address] [place], [place], [address] Phone: [phone]   Fax:  [phone]  Name: Scott [name]. MRN: [id number] Date of Birth: 1955-06-22

## 2019-05-02 ENCOUNTER — Other Ambulatory Visit: Payer: Self-pay

## 2019-05-02 ENCOUNTER — Ambulatory Visit: Payer: PPO

## 2019-05-02 DIAGNOSIS — R262 Difficulty in walking, not elsewhere classified: Secondary | ICD-10-CM

## 2019-05-02 DIAGNOSIS — R600 Localized edema: Secondary | ICD-10-CM

## 2019-05-02 DIAGNOSIS — M25661 Stiffness of right knee, not elsewhere classified: Secondary | ICD-10-CM

## 2019-05-02 DIAGNOSIS — M25561 Pain in right knee: Secondary | ICD-10-CM | POA: Diagnosis not present

## 2019-05-02 NOTE — Therapy (Signed)
[place], [place], [address] Phone: [phone]   Fax:  [phone]  Physical Therapy Treatment  Patient Details  Name: Scott [name]. MRN: [id number] Date of Birth: [birth year] Referring Provider (PT): Genevive Bi   Encounter Date: 05/02/2019  PT End of Session - 05/02/19 0954    Visit Number  5    Number of Visits  24    Date for PT Re-Evaluation  07/08/19    Authorization Type  Health team advantage    Authorization Time Period  progress visit 10       KX visit 15    PT Start Time  0955    PT Stop Time  1050    PT Time Calculation (min)  55 min    Activity Tolerance  Patient tolerated treatment well    Behavior During Therapy  [facility] for tasks assessed/performed       Past Medical History:  Diagnosis Date  . ALLERGIC RHINITIS 01/14/2007  . ANXIETY 09/28/2006  . Asbestos exposure   . Atrial fibrillation ([place]) 09/28/2006   s/p afib ablation x 2  . CHF (congestive heart failure) ([place])   . CKD (chronic kidney disease), stage III    patient denies  . COPD (chronic obstructive pulmonary disease) ([place])   . Coronary artery disease 11/24/2011  . Diastolic dysfunction 99991111  . Dyspnea    W/ EXERTION   . Dysrhythmia    AFIB     . First degree AV block 10/12/2018   Noted on EKG  . Gallstones   . Hyperglycemia   . HYPERLIPIDEMIA 09/28/2006  . HYPERTENSION 09/28/2006  . HYPOTHYROIDISM, ACQUIRED NEC 09/28/2006   pt denies and doesn't know  . Long term current use of anticoagulant 04/08/2010  . Obese   . OBSTRUCTIVE SLEEP APNEA 01/06/2008   NOT USING    . Right knee DJD 07/09/2010  . S/P CABG x 1 11/26/2011   Right internal mammary artery to right coronary artery  . S/P Maze operation for atrial fibrillation 11/26/2011   complete biatrial lesion set with clipping of LA appendage    Past Surgical History:  Procedure Laterality Date  . ANTERIOR CERVICAL DECOMP/DISCECTOMY FUSION N/A 06/20/2016    Procedure: Anterior Cervical Discectomy Fusion - Cervical five-Cervical six - Cervical six-Cervical seven - Cervicla seven- Thoracic one;  Surgeon: Earnie Larsson, MD;  Location: [facility];  Service: Neurosurgery;  Laterality: N/A;  . CARDIOVERSION     X4   . CORONARY ARTERY BYPASS GRAFT  11/26/2011   Procedure: CORONARY ARTERY BYPASS GRAFTING (CABG);  Surgeon: Rexene Alberts, MD;  Location: [facility];  Service: Open Heart Surgery;  Laterality: N/A;  coronary artery bypass on pump times one utilizing the right internal mammary artery, transesophageal echocardiogram   . HAND SURGERY Left   . LEFT HEART CATHETERIZATION WITH CORONARY ANGIOGRAM N/A 11/24/2011   Procedure: LEFT HEART CATHETERIZATION WITH CORONARY ANGIOGRAM;  Surgeon: Peter M Martinique, MD;  Location: [facility] CATH LAB;  Service: Cardiovascular;  Laterality: N/A;  . LOOP RECORDER IMPLANT N/A 07/06/2012   Procedure: LOOP RECORDER IMPLANT;  Surgeon: Thompson Grayer, MD;  Location: [facility] CATH LAB;  Service: Cardiovascular;  Laterality: N/A;  . MAZE  11/26/2011   Procedure: MAZE;  Surgeon: Rexene Alberts, MD;  Location: [facility];  Service: Open Heart Surgery;  Laterality: N/A;  Cryomaze   . Radiofrequency Ablation for atrial fibrillation  12/30/07 and 06/05/09   afib ablation x 2 by JA  .  Radiofrequency ablation for atrial flutter  2005   CTI  . TOTAL KNEE ARTHROPLASTY Right 04/05/2019   Procedure: RIGHT TOTAL KNEE ARTHROPLASTY;  Surgeon: Melrose Nakayama, MD;  [facility];  Service: Orthopedics;  Laterality: Right;    There were no vitals filed for this visit.  Subjective Assessment - 05/02/19 1046    Subjective  Walking more but still with RW out of home. Pain manageble .  Wonder if ROM is better . Wondered how he was doing    Pain Score  4     Pain Location  Knee    Pain Orientation  Right;Anterior;Lateral    Pain Descriptors / Indicators  Aching    Pain Type  Surgical pain    Aggravating Factors   stand and walking , bending and straightening    Pain  Relieving Factors  moving , cold.         [facility] PT Assessment - 05/02/19 0001      AROM   Right Knee Flexion  120      PROM   Right Knee Extension  -8                   [facility] Adult PT Treatment/Exercise - 05/02/19 0001      Knee/Hip Exercises: Aerobic   Nustep  L4 UE/LE x 6 minutes       Knee/Hip Exercises: Standing   Heel Raises  Both;20 reps    Knee Flexion  Right;Left;20 reps    Hip Flexion  Right;Left;20 reps    Hip Flexion Limitations  alternating     Hip Abduction  Right;Left;20 reps      Knee/Hip Exercises: Seated   Long Arc Quad  Right;Left;20 reps;Weights    Long Arc Quad Weight  4 lbs.      Knee/Hip Exercises: Supine   Heel Slides  Right;5 reps    Heel Slides Limitations  hold  sec    Bridges  Both;15 reps    Straight Leg Raises  Right;20 reps    Other Supine Knee/Hip Exercises  clams  green band  x 15      Vasopneumatic   Number Minutes Vasopneumatic   15 minutes    Vasopnuematic Location   Knee    Vasopneumatic Pressure  Medium    Vasopneumatic Temperature   34 degrees      Manual Therapy   Joint Mobilization  AP glides for knee flexion followed by PROm, patella mobs all planes  and extension 30 sec holds                PT Short Term Goals - 04/18/19 1324      PT SHORT TERM GOAL #1   Title  He will be indpendent with initial hEp    Time  4    Period  Weeks    Status  New      PT SHORT TERM GOAL #2   Title  He will be walking out of home with least restrictive device    Time  4    Period  Weeks    Status  New      PT SHORT TERM GOAL #3   Title  He will walk full time in home with [facility]    Time  4    Period  Weeks    Status  New      PT SHORT TERM GOAL #4   Title  Active RT knee flexion to 100 degrees Scott better  Time  4    Period  Weeks    Status  New        PT Long Term Goals - 04/18/19 1328      PT LONG TERM GOAL #1   Title  He will be indpendent with all HEP issued    Time  12    Period  Weeks     Status  New      PT LONG TERM GOAL #2   Title  He will walk in home no device and out of home least restricted device    Time  12    Period  Weeks    Status  New      PT LONG TERM GOAL #3   Title  He will be indpendent with all normal home tasks    Time  12    Period  Weeks    Status  New      PT LONG TERM GOAL #4   Title  He will return to walking dog 1/4 mile for exercise    Time  12    Period  Weeks    Status  New      PT LONG TERM GOAL #5   Title  He will improve flexion to 1120 degrees active and extension to -5-10 degrees to improve gait pattern and  tolerance    Time  12    Period  Weeks    Status  New      Additional Long Term Goals   Additional Long Term Goals  Yes      PT LONG TERM GOAL #6   Title  He will return to work at [employer]    Time  12    Period  Weeks    Status  New            [place] - 05/02/19 0955    Clinical Impression Statement  Continues to improve tolerating incr reps and weight. ROM is improved. Discussed walking and how to modify to not increase pain .  He is doing well.    PT Treatment/Interventions  Taping;Manual techniques;Passive range of motion;Patient/family education;Therapeutic exercise;Therapeutic activities;Stair training;Gait training;Vasopneumatic Device    PT Next Visit Plan  contniue strength , vaso, manual,  SPC gait    PT Home Exercise Plan  All HEP reviewed and pt independent (HHPT HEP quad set, SAQ, LAQ, assisted knee flexion in sitting, heel slides SLR, side hip abduction)    Consulted and Agree with Plan of Care  Patient       Patient will benefit from skilled therapeutic intervention in order to improve the following deficits and impairments:  Pain, Difficulty walking, Decreased range of motion, Decreased balance, Decreased strength, Increased edema  Visit Diagnosis: Acute pain of right knee  Localized edema  Difficulty in walking, not elsewhere classified  Stiffness of right knee, not elsewhere  classified     Problem List Patient Active Problem List   Diagnosis Date Noted  . Primary osteoarthritis of right knee 04/05/2019  . CKD (chronic kidney disease) stage 3, GFR 30-59 ml/min 03/14/2019  . Degenerative joint disease of knee, right 09/27/2018  . Dyspnea 09/06/2018  . Right knee pain 09/06/2018  . Radiculitis of left cervical region 03/05/2018  . Plantar fasciitis, left 08/19/2017  . Chest pain 02/27/2017  . Right ear pain 02/27/2017  . Left groin pain 09/13/2016  . Rib pain on left side 09/13/2016  . Left leg pain 09/13/2016  . Stenosis of cervical  spine with myelopathy ([place]) 06/20/2016  . Special screening for malignant neoplasms, colon 04/04/2016  . Cough 01/11/2016  . COPD exacerbation ([place]) 12/22/2015  . Rectal bleeding 08/14/2014  . Intercostal muscle strain 08/07/2014  . Chronic meniscal tear of knee 04/06/2014  . Heel bone fracture 04/06/2014  . Hyperglycemia 04/04/2014  . Recurrent falls 04/04/2014  . Bilateral knee pain 04/04/2014  . Intractable left heel pain 04/04/2014  . Insomnia 04/04/2014  . Hematochezia 04/04/2014  . Bladder neck obstruction 12/19/2013  . Encounter for therapeutic drug monitoring 03/30/2013  . Palpitations 07/06/2012  . Shortness of breath 01/19/2012  . Sick sinus syndrome ([place]) 12/01/2011  . S/P CABG x 1 11/26/2011  . S/P Maze operation for atrial fibrillation 11/26/2011  . CAD (coronary artery disease) 11/25/2011  . Paroxysmal atrial fibrillation ([place]) 11/24/2011  . Chronic combined systolic (congestive) and diastolic (congestive) heart failure ([place]) 11/24/2011  . Gross hematuria 12/12/2010  . Left knee pain 12/12/2010  . Left knee DJD 07/09/2010  . Preventative health care 07/07/2010  . Chronic anticoagulation 04/08/2010  . COPD (chronic obstructive pulmonary disease) ([place]) 10/04/2008  . OBSTRUCTIVE SLEEP APNEA 01/06/2008  . ALLERGIC RHINITIS 01/14/2007  . Hypothyroidism 09/28/2006  . Hyperlipidemia 09/28/2006  .  Anxiety state 09/28/2006  . Essential hypertension 09/28/2006    Scott [name]  PT 05/02/2019, 10:47 AM  [facility] [address] [place], [place], [address] Phone: [phone]   Fax:  [phone]  Name: Parker Delacerda. MRN: [id number] Date of Birth: 01-Apr-1955

## 2019-05-05 ENCOUNTER — Ambulatory Visit: Payer: PPO

## 2019-05-05 ENCOUNTER — Other Ambulatory Visit: Payer: Self-pay

## 2019-05-05 DIAGNOSIS — M25661 Stiffness of right knee, not elsewhere classified: Secondary | ICD-10-CM

## 2019-05-05 DIAGNOSIS — R262 Difficulty in walking, not elsewhere classified: Secondary | ICD-10-CM

## 2019-05-05 DIAGNOSIS — R600 Localized edema: Secondary | ICD-10-CM

## 2019-05-05 DIAGNOSIS — M25561 Pain in right knee: Secondary | ICD-10-CM

## 2019-05-05 NOTE — Therapy (Signed)
[place], [place], [address] Phone: [phone]   Fax:  [phone]  Physical Therapy Treatment  Patient Details  Name: Scott [name]. MRN: [id number] Date of Birth: 09-29-1955 Referring Provider (PT): Genevive Bi   Encounter Date: 05/05/2019  PT End of Session - 05/05/19 1048    Visit Number  6    Number of Visits  24    Date for PT Re-Evaluation  07/08/19    Authorization Type  [employer] team advantage    Authorization Time Period  progress visit 10       KX visit 15    PT Start Time  9924    PT Stop Time  1140    PT Time Calculation (min)  58 min    Activity Tolerance  Patient tolerated treatment well    Behavior During Therapy  WFL for tasks assessed/performed       Past Medical History:  Diagnosis Date  . ALLERGIC RHINITIS 01/14/2007  . ANXIETY 09/28/2006  . Asbestos exposure   . Atrial fibrillation ([place]) 09/28/2006   s/p afib ablation x 2  . CHF (congestive heart failure) ([place])   . CKD (chronic kidney disease), stage III    patient denies  . COPD (chronic obstructive pulmonary disease) ([place])   . Coronary artery disease 11/24/2011  . Diastolic dysfunction 04/02/8339  . Dyspnea    W/ EXERTION   . Dysrhythmia    AFIB     . First degree AV block 10/12/2018   Noted on EKG  . Gallstones   . Hyperglycemia   . HYPERLIPIDEMIA 09/28/2006  . HYPERTENSION 09/28/2006  . HYPOTHYROIDISM, ACQUIRED NEC 09/28/2006   pt denies and doesn't know  . Long term current use of anticoagulant 04/08/2010  . Obese   . OBSTRUCTIVE SLEEP APNEA 01/06/2008   NOT USING    . Right knee DJD 07/09/2010  . S/P CABG x 1 11/26/2011   Right internal mammary artery to right coronary artery  . S/P Maze operation for atrial fibrillation 11/26/2011   complete biatrial lesion set with clipping of LA appendage    Past Surgical History:  Procedure Laterality Date  . ANTERIOR CERVICAL DECOMP/DISCECTOMY FUSION N/A 06/20/2016   Procedure: Anterior Cervical Discectomy Fusion - Cervical five-Cervical six - Cervical six-Cervical seven - Cervicla seven- Thoracic one;  Surgeon: Earnie Larsson, MD;  Location: [facility];  Service: Neurosurgery;  Laterality: N/A;  . CARDIOVERSION     X4   . CORONARY ARTERY BYPASS GRAFT  11/26/2011   Procedure: CORONARY ARTERY BYPASS GRAFTING (CABG);  Surgeon: Rexene Alberts, MD;  Location: [facility];  Service: Open Heart Surgery;  Laterality: N/A;  coronary artery bypass on pump times one utilizing the right internal mammary artery, transesophageal echocardiogram   . HAND SURGERY Left   . LEFT HEART CATHETERIZATION WITH CORONARY ANGIOGRAM N/A 11/24/2011   Procedure: LEFT HEART CATHETERIZATION WITH CORONARY ANGIOGRAM;  Surgeon: Peter M Martinique, MD;  Location: [facility] CATH LAB;  Service: Cardiovascular;  Laterality: N/A;  . LOOP RECORDER IMPLANT N/A 07/06/2012   Procedure: LOOP RECORDER IMPLANT;  Surgeon: Thompson Grayer, MD;  Location: [facility] CATH LAB;  Service: Cardiovascular;  Laterality: N/A;  . MAZE  11/26/2011   Procedure: MAZE;  Surgeon: Rexene Alberts, MD;  Location: [facility];  Service: Open Heart Surgery;  Laterality: N/A;  Cryomaze   . Radiofrequency Ablation for atrial fibrillation  12/30/07 and 06/05/09   afib ablation x 2 by JA  . Radiofrequency  ablation for atrial flutter  2005   CTI  . TOTAL KNEE ARTHROPLASTY Right 04/05/2019   Procedure: RIGHT TOTAL KNEE ARTHROPLASTY;  Surgeon: Melrose Nakayama, MD;  Location: [facility];  Service: Orthopedics;  Laterality: Right;    There were no vitals filed for this visit.  Subjective Assessment - 05/05/19 1049    Subjective  Walked more yesterday  so more discomfort today 6/10 but more stiff than pain    Pain Score  6                        [facility] Adult PT Treatment/Exercise - 05/05/19 0001      Knee/Hip Exercises: Stretches   Gastroc Stretch  Both;60 seconds    Gastroc Stretch Limitations  slant board      Knee/Hip Exercises: Aerobic   Nustep  L4 UE/LE  x 6 minutes       Knee/Hip Exercises: Standing   Heel Raises  Both;20 reps    Knee Flexion  Right;Left;20 reps    Hip Flexion  Right;Left;20 reps    Hip Flexion Limitations  alternating     Hip Abduction  Right;Left;20 reps    Lateral Step Up  Right;Left;Hand Hold: 2;Step Height: 6";15 reps    Other Standing Knee Exercises  weight shifting RT and LT with arm reaching overhead      Knee/Hip Exercises: Seated   Long Arc Quad  Right;20 reps    Long Arc Quad Weight  6 lbs.      Vasopneumatic   Number Minutes Vasopneumatic   15 minutes    Vasopnuematic Location   Knee    Vasopneumatic Pressure  Medium    Vasopneumatic Temperature   34 degrees      Manual Therapy   Joint Mobilization  AP glides for knee flexion followed by PROm, patella mobs all planes  and extension 30 sec holds                PT Short Term Goals - 05/05/19 1126      PT SHORT TERM GOAL #1   Title  He will be indpendent with initial hEp    Status  Achieved      PT SHORT TERM GOAL #2   Title  He will be walking out of home with least restrictive device    Baseline  uses SPC now but longer walks still with walker as he is on road    Status  Partially Met      PT SHORT TERM GOAL #3   Title  He will walk full time in home with [facility]    Status  Achieved        PT Long Term Goals - 04/18/19 1328      PT LONG TERM GOAL #1   Title  He will be indpendent with all HEP issued    Time  12    Period  Weeks    Status  New      PT LONG TERM GOAL #2   Title  He will walk in home no device and out of home least restricted device    Time  12    Period  Weeks    Status  New      PT LONG TERM GOAL #3   Title  He will be indpendent with all normal home tasks    Time  12    Period  Weeks    Status  New  PT LONG TERM GOAL #4   Title  He will return to walking dog 1/4 mile for exercise    Time  12    Period  Weeks    Status  New      PT LONG TERM GOAL #5   Title  He will improve flexion to 1120  degrees active and extension to -5-10 degrees to improve gait pattern and  tolerance    Time  12    Period  Weeks    Status  New      Additional Long Term Goals   Additional Long Term Goals  Yes      PT LONG TERM GOAL #6   Title  He will return to work at [employer]    Time  12    Period  Weeks    Status  New            Plan - 05/05/19 1048    Clinical Impression Statement  i think he is progressing well with ROM and strength.  He continues to walk on regular basisi . Weakness and tightness primary issues.   6 in step was difficult.    PT Treatment/Interventions  Taping;Manual techniques;Passive range of motion;Patient/family education;Therapeutic exercise;Therapeutic activities;Stair training;Gait training;Vasopneumatic Device    PT Next Visit Plan  contniue strength , vaso, manual,  SPC gait    PT Home Exercise Plan  All HEP reviewed and pt independent (HHPT HEP quad set, SAQ, LAQ, assisted knee flexion in sitting, heel slides SLR, side hip abduction)    Consulted and Agree with Plan of Care  Patient       Patient will benefit from skilled therapeutic intervention in order to improve the following deficits and impairments:  Pain, Difficulty walking, Decreased range of motion, Decreased balance, Decreased strength, Increased edema  Visit Diagnosis: Acute pain of right knee  Localized edema  Difficulty in walking, not elsewhere classified  Stiffness of right knee, not elsewhere classified     Problem List Patient Active Problem List   Diagnosis Date Noted  . Primary osteoarthritis of right knee 04/05/2019  . CKD (chronic kidney disease) stage 3, GFR 30-59 ml/min 03/14/2019  . Degenerative joint disease of knee, right 09/27/2018  . Dyspnea 09/06/2018  . Right knee pain 09/06/2018  . Radiculitis of left cervical region 03/05/2018  . Plantar fasciitis, left 08/19/2017  . Chest pain 02/27/2017  . Right ear pain 02/27/2017  . Left groin pain 09/13/2016  . Rib  pain on left side 09/13/2016  . Left leg pain 09/13/2016  . Stenosis of cervical spine with myelopathy ([place]) 06/20/2016  . Special screening for malignant neoplasms, colon 04/04/2016  . Cough 01/11/2016  . COPD exacerbation ([place]) 12/22/2015  . Rectal bleeding 08/14/2014  . Intercostal muscle strain 08/07/2014  . Chronic meniscal tear of knee 04/06/2014  . Heel bone fracture 04/06/2014  . Hyperglycemia 04/04/2014  . Recurrent falls 04/04/2014  . Bilateral knee pain 04/04/2014  . Intractable left heel pain 04/04/2014  . Insomnia 04/04/2014  . Hematochezia 04/04/2014  . Bladder neck obstruction 12/19/2013  . Encounter for therapeutic drug monitoring 03/30/2013  . Palpitations 07/06/2012  . Shortness of breath 01/19/2012  . Sick sinus syndrome ([place]) 12/01/2011  . S/P CABG x 1 11/26/2011  . S/P Maze operation for atrial fibrillation 11/26/2011  . CAD (coronary artery disease) 11/25/2011  . Paroxysmal atrial fibrillation ([place]) 11/24/2011  . Chronic combined systolic (congestive) and diastolic (congestive) heart failure ([place]) 11/24/2011  . Gross hematuria  12/12/2010  . Left knee pain 12/12/2010  . Left knee DJD 07/09/2010  . Preventative health care 07/07/2010  . Chronic anticoagulation 04/08/2010  . COPD (chronic obstructive pulmonary disease) ([place]) 10/04/2008  . OBSTRUCTIVE SLEEP APNEA 01/06/2008  . ALLERGIC RHINITIS 01/14/2007  . Hypothyroidism 09/28/2006  . Hyperlipidemia 09/28/2006  . Anxiety state 09/28/2006  . Essential hypertension 09/28/2006    Darrel Hoover  PT 05/05/2019, 12:47 PM  [facility] [address] [place], [place], [address] Phone: [phone]   Fax:  [phone]  Name: Scott [name]. MRN: [id number] Date of Birth: 13-Apr-1955

## 2019-05-06 ENCOUNTER — Encounter: Payer: Self-pay | Admitting: Internal Medicine

## 2019-05-09 ENCOUNTER — Ambulatory Visit: Payer: PPO

## 2019-05-09 ENCOUNTER — Other Ambulatory Visit: Payer: Self-pay

## 2019-05-09 DIAGNOSIS — M25561 Pain in right knee: Secondary | ICD-10-CM | POA: Diagnosis not present

## 2019-05-09 DIAGNOSIS — R262 Difficulty in walking, not elsewhere classified: Secondary | ICD-10-CM

## 2019-05-09 DIAGNOSIS — M25661 Stiffness of right knee, not elsewhere classified: Secondary | ICD-10-CM

## 2019-05-09 DIAGNOSIS — R600 Localized edema: Secondary | ICD-10-CM

## 2019-05-09 NOTE — Therapy (Signed)
[place], [place], [address] Phone: [phone]   Fax:  [phone]  Physical Therapy Treatment  Patient Details  Name: Scott [name]. MRN: [id number] Date of Birth: 13-Mar-1955 Referring Provider (PT): Genevive Bi   Encounter Date: 05/09/2019  PT End of Session - 05/09/19 1056    Visit Number  7    Number of Visits  24    Date for PT Re-Evaluation  07/08/19    Authorization Type  Health team advantage    Authorization Time Period  progress visit 10       KX visit 15    PT Start Time  1045    PT Stop Time  1145    PT Time Calculation (min)  60 min    Activity Tolerance  Patient tolerated treatment well    Behavior During Therapy  WFL for tasks assessed/performed       Past Medical History:  Diagnosis Date  . ALLERGIC RHINITIS 01/14/2007  . ANXIETY 09/28/2006  . Asbestos exposure   . Atrial fibrillation ([place]) 09/28/2006   s/p afib ablation x 2  . CHF (congestive heart failure) ([place])   . CKD (chronic kidney disease), stage III    patient denies  . COPD (chronic obstructive pulmonary disease) ([place])   . Coronary artery disease 11/24/2011  . Diastolic dysfunction 04/04/9240  . Dyspnea    W/ EXERTION   . Dysrhythmia    AFIB     . First degree AV block 10/12/2018   Noted on EKG  . Gallstones   . Hyperglycemia   . HYPERLIPIDEMIA 09/28/2006  . HYPERTENSION 09/28/2006  . HYPOTHYROIDISM, ACQUIRED NEC 09/28/2006   pt denies and doesn't know  . Long term current use of anticoagulant 04/08/2010  . Obese   . OBSTRUCTIVE SLEEP APNEA 01/06/2008   NOT USING    . Right knee DJD 07/09/2010  . S/P CABG x 1 11/26/2011   Right internal mammary artery to right coronary artery  . S/P Maze operation for atrial fibrillation 11/26/2011   complete biatrial lesion set with clipping of LA appendage    Past Surgical History:  Procedure Laterality Date  . ANTERIOR CERVICAL DECOMP/DISCECTOMY FUSION N/A 06/20/2016   Procedure: Anterior Cervical Discectomy Fusion - Cervical five-Cervical six - Cervical six-Cervical seven - Cervicla seven- Thoracic one;  Surgeon: Earnie Larsson, MD;  Location: [facility];  Service: Neurosurgery;  Laterality: N/A;  . CARDIOVERSION     X4   . CORONARY ARTERY BYPASS GRAFT  11/26/2011   Procedure: CORONARY ARTERY BYPASS GRAFTING (CABG);  Surgeon: Rexene Alberts, MD;  Location: [facility];  Service: Open Heart Surgery;  Laterality: N/A;  coronary artery bypass on pump times one utilizing the right internal mammary artery, transesophageal echocardiogram   . HAND SURGERY Left   . LEFT HEART CATHETERIZATION WITH CORONARY ANGIOGRAM N/A 11/24/2011   Procedure: LEFT HEART CATHETERIZATION WITH CORONARY ANGIOGRAM;  Surgeon: Peter M Martinique, MD;  Location: [facility] CATH LAB;  Service: Cardiovascular;  Laterality: N/A;  . LOOP RECORDER IMPLANT N/A 07/06/2012   Procedure: LOOP RECORDER IMPLANT;  Surgeon: Thompson Grayer, MD;  Location: [facility] CATH LAB;  Service: Cardiovascular;  Laterality: N/A;  . MAZE  11/26/2011   Procedure: MAZE;  Surgeon: Rexene Alberts, MD;  Location: [facility];  Service: Open Heart Surgery;  Laterality: N/A;  Cryomaze   . Radiofrequency Ablation for atrial fibrillation  12/30/07 and 06/05/09   afib ablation x 2 by JA  . Radiofrequency  ablation for atrial flutter  2005   CTI  . TOTAL KNEE ARTHROPLASTY Right 04/05/2019   Procedure: RIGHT TOTAL KNEE ARTHROPLASTY;  Surgeon: Melrose Nakayama, MD;  [facility];  Service: Orthopedics;  Laterality: Right;    There were no vitals filed for this visit.  Subjective Assessment - 05/09/19 1117    Subjective  Sore but not toobad. Walked dog this weekend and was sore after.    Pain Score  5     Pain Location  Knee    Pain Orientation  Right;Anterior;Lateral    Pain Descriptors / Indicators  Aching    Pain Type  Surgical pain    Aggravating Factors   activity on feet.  ROM stretching    Pain Relieving Factors  cold , Gentle activity         [facility] PT  Assessment - 05/09/19 0001      AROM   Right Knee Extension  -20    Right Knee Flexion  125      PROM   Right Knee Extension  -8                   [facility] Adult PT Treatment/Exercise - 05/09/19 0001      Knee/Hip Exercises: Aerobic   Nustep  L4 UE/Scott 7 minutes       Knee/Hip Exercises: Standing   Heel Raises  Both;20 reps    Functional Squat  15 reps    Functional Squat Limitations  a t freemotion      Vasopneumatic   Number Minutes Vasopneumatic   15 minutes    Vasopnuematic Location   Knee    Vasopneumatic Pressure  Medium    Vasopneumatic Temperature   34 degrees      Manual Therapy   Joint Mobilization  AP glides for knee flexion followed by PROm, patella mobs all planes  and extension 30 sec holds       SAQ  X 30 5 sec 5#  SLR x 20 RT cued to max quad contraction       PT Short Term Goals - 05/09/19 1131      PT SHORT TERM GOAL #1   Title  He will be indpendent with initial hEp    Status  Achieved      PT SHORT TERM GOAL #2   Title  He will be walking out of home with least restrictive device    Baseline  uses SPC now but longer walks still with walker as he is on road    Status  Partially Met      PT SHORT TERM GOAL #3   Title  He will walk full time in home with [facility]    Status  Achieved      PT SHORT TERM GOAL #4   Title  Active RT knee flexion to 100 degrees or better    Status  Achieved        PT Long Term Goals - 04/18/19 1328      PT LONG TERM GOAL #1   Title  He will be indpendent with all HEP issued    Time  12    Period  Weeks    Status  New      PT LONG TERM GOAL #2   Title  He will walk in home no device and out of home least restricted device    Time  12    Period  Weeks    Status  New  PT LONG TERM GOAL #3   Title  He will be indpendent with all normal home tasks    Time  12    Period  Weeks    Status  New      PT LONG TERM GOAL #4   Title  He will return to walking dog 1/4 mile for exercise    Time  12     Period  Weeks    Status  New      PT LONG TERM GOAL #5   Title  He will improve flexion to 1120 degrees active and extension to -5-10 degrees to improve gait pattern and  tolerance    Time  12    Period  Weeks    Status  New      Additional Long Term Goals   Additional Long Term Goals  Yes      PT LONG TERM GOAL #6   Title  He will return to work at [employer]    Time  12    Period  Weeks    Status  New            Plan - 05/09/19 1057    Clinical Impression Statement  Flexion ROM is improved and extension  with less lag.   Still limited with ambulation tolerance but did walk dog this weekend for short period.  Was sore after  this    PT Treatment/Interventions  Taping;Manual techniques;Passive range of motion;Patient/family education;Therapeutic exercise;Therapeutic activities;Stair training;Gait training;Vasopneumatic Device    PT Next Visit Plan  contniue strength , vaso, manual,  SPC gait    PT Home Exercise Plan  All HEP reviewed and pt independent (HHPT HEP quad set, SAQ, LAQ, assisted knee flexion in sitting, heel slides SLR, side hip abduction)    Consulted and Agree with Plan of Care  Patient       Patient will benefit from skilled therapeutic intervention in order to improve the following deficits and impairments:  Pain, Difficulty walking, Decreased range of motion, Decreased balance, Decreased strength, Increased edema  Visit Diagnosis: Acute pain of right knee  Localized edema  Difficulty in walking, not elsewhere classified  Stiffness of right knee, not elsewhere classified     Problem List Patient Active Problem List   Diagnosis Date Noted  . Primary osteoarthritis of right knee 04/05/2019  . CKD (chronic kidney disease) stage 3, GFR 30-59 ml/min 03/14/2019  . Degenerative joint disease of knee, right 09/27/2018  . Dyspnea 09/06/2018  . Right knee pain 09/06/2018  . Radiculitis of left cervical region 03/05/2018  . Plantar fasciitis, left  08/19/2017  . Chest pain 02/27/2017  . Right ear pain 02/27/2017  . Left groin pain 09/13/2016  . Rib pain on left side 09/13/2016  . Left leg pain 09/13/2016  . Stenosis of cervical spine with myelopathy ([place]) 06/20/2016  . Special screening for malignant neoplasms, colon 04/04/2016  . Cough 01/11/2016  . COPD exacerbation ([place]) 12/22/2015  . Rectal bleeding 08/14/2014  . Intercostal muscle strain 08/07/2014  . Chronic meniscal tear of knee 04/06/2014  . Heel bone fracture 04/06/2014  . Hyperglycemia 04/04/2014  . Recurrent falls 04/04/2014  . Bilateral knee pain 04/04/2014  . Intractable left heel pain 04/04/2014  . Insomnia 04/04/2014  . Hematochezia 04/04/2014  . Bladder neck obstruction 12/19/2013  . Encounter for therapeutic drug monitoring 03/30/2013  . Palpitations 07/06/2012  . Shortness of breath 01/19/2012  . Sick sinus syndrome ([place]) 12/01/2011  . S/P CABG x  1 11/26/2011  . S/P Maze operation for atrial fibrillation 11/26/2011  . CAD (coronary artery disease) 11/25/2011  . Paroxysmal atrial fibrillation ([place]) 11/24/2011  . Chronic combined systolic (congestive) and diastolic (congestive) heart failure ([place]) 11/24/2011  . Gross hematuria 12/12/2010  . Left knee pain 12/12/2010  . Left knee DJD 07/09/2010  . Preventative health care 07/07/2010  . Chronic anticoagulation 04/08/2010  . COPD (chronic obstructive pulmonary disease) ([place]) 10/04/2008  . OBSTRUCTIVE SLEEP APNEA 01/06/2008  . ALLERGIC RHINITIS 01/14/2007  . Hypothyroidism 09/28/2006  . Hyperlipidemia 09/28/2006  . Anxiety state 09/28/2006  . Essential hypertension 09/28/2006    Darrel Hoover  PT 05/09/2019, 11:33 AM  [facility] [address] [place], [place], [address] Phone: [phone]   Fax:  [phone]  Name: Jarred Purtee. MRN: [id number] Date of Birth: 07-08-1955

## 2019-05-12 ENCOUNTER — Other Ambulatory Visit: Payer: Self-pay

## 2019-05-12 ENCOUNTER — Ambulatory Visit: Payer: PPO

## 2019-05-12 DIAGNOSIS — R262 Difficulty in walking, not elsewhere classified: Secondary | ICD-10-CM

## 2019-05-12 DIAGNOSIS — M25561 Pain in right knee: Secondary | ICD-10-CM

## 2019-05-12 DIAGNOSIS — M25661 Stiffness of right knee, not elsewhere classified: Secondary | ICD-10-CM

## 2019-05-12 DIAGNOSIS — R600 Localized edema: Secondary | ICD-10-CM

## 2019-05-12 NOTE — Therapy (Signed)
[facility] [place], [place], [address] Phone: [phone]   Fax:  [phone]  Physical Therapy Treatment  Patient Details  Name: Scott [name]. MRN: [id number] Date of Birth: 1955-10-26 Referring Provider (PT): Genevive Bi   Encounter Date: 05/12/2019  PT End of Session - 05/12/19 1047    Visit Number  8    Number of Visits  24    Date for PT Re-Evaluation  07/08/19    Authorization Type  [employer]    Authorization Time Period  progress visit 10       KX visit 15    PT Start Time  0145    PT Stop Time  1145    PT Time Calculation (min)  600 min    Activity Tolerance  Patient tolerated treatment well    Behavior During Therapy  WFL for tasks assessed/performed       Past Medical History:  Diagnosis Date  . ALLERGIC RHINITIS 01/14/2007  . ANXIETY 09/28/2006  . Asbestos exposure   . Atrial fibrillation ([place]) 09/28/2006   s/p afib ablation x 2  . CHF (congestive heart failure) ([place])   . CKD (chronic kidney disease), stage III    patient denies  . COPD (chronic obstructive pulmonary disease) ([place])   . Coronary artery disease 11/24/2011  . Diastolic dysfunction 06/27/2990  . Dyspnea    W/ EXERTION   . Dysrhythmia    AFIB     . First degree AV block 10/12/2018   Noted on EKG  . Gallstones   . Hyperglycemia   . HYPERLIPIDEMIA 09/28/2006  . HYPERTENSION 09/28/2006  . HYPOTHYROIDISM, ACQUIRED NEC 09/28/2006   pt denies and doesn't know  . Long term current use of anticoagulant 04/08/2010  . Obese   . OBSTRUCTIVE SLEEP APNEA 01/06/2008   NOT USING    . Right knee DJD 07/09/2010  . S/P CABG x 1 11/26/2011   Right internal mammary artery to right coronary artery  . S/P Maze operation for atrial fibrillation 11/26/2011   complete biatrial lesion set with clipping of LA appendage    Past Surgical History:  Procedure Laterality Date  . ANTERIOR CERVICAL DECOMP/DISCECTOMY FUSION N/A 06/20/2016   Procedure: Anterior Cervical Discectomy Fusion - Cervical five-Cervical six - Cervical six-Cervical seven - Cervicla seven- Thoracic one;  Surgeon: Earnie Larsson, MD;  Location: [facility];  Service: Neurosurgery;  Laterality: N/A;  . CARDIOVERSION     X4   . CORONARY ARTERY BYPASS GRAFT  11/26/2011   Procedure: CORONARY ARTERY BYPASS GRAFTING (CABG);  Surgeon: Rexene Alberts, MD;  Location: [facility];  Service: Open Heart Surgery;  Laterality: N/A;  coronary artery bypass on pump times one utilizing the right internal mammary artery, transesophageal echocardiogram   . HAND SURGERY Left   . LEFT HEART CATHETERIZATION WITH CORONARY ANGIOGRAM N/A 11/24/2011   Procedure: LEFT HEART CATHETERIZATION WITH CORONARY ANGIOGRAM;  Surgeon: Peter M Martinique, MD;  Location: [facility] CATH LAB;  Service: Cardiovascular;  Laterality: N/A;  . LOOP RECORDER IMPLANT N/A 07/06/2012   Procedure: LOOP RECORDER IMPLANT;  Surgeon: Thompson Grayer, MD;  Location: [facility] CATH LAB;  Service: Cardiovascular;  Laterality: N/A;  . MAZE  11/26/2011   Procedure: MAZE;  Surgeon: Rexene Alberts, MD;  Location: [facility];  Service: Open Heart Surgery;  Laterality: N/A;  Cryomaze   . Radiofrequency Ablation for atrial fibrillation  12/30/07 and 06/05/09   afib ablation x 2 by JA  . Radiofrequency  ablation for atrial flutter  2005   CTI  . TOTAL KNEE ARTHROPLASTY Right 04/05/2019   Procedure: RIGHT TOTAL KNEE ARTHROPLASTY;  Surgeon: Melrose Nakayama, MD;  Location: [facility];  Service: Orthopedics;  Laterality: Right;    There were no vitals filed for this visit.  Subjective Assessment - 05/12/19 1053    Subjective  Pain is considerably lower than  normal    Pain Score  3     Pain Location  Knee    Pain Orientation  Right;Anterior;Lateral    Pain Descriptors / Indicators  Aching    Pain Type  Surgical pain         [facility] PT Assessment - 05/12/19 0001      Circumferential Edema   Circumferential - Right  44cm       AROM   Right Knee Extension  -15     Right Knee Flexion  127                   [facility] Adult PT Treatment/Exercise - 05/12/19 0001      Knee/Hip Exercises: Aerobic   Nustep  L6 UE/LE 7 minutes       Knee/Hip Exercises: Standing   Heel Raises  Both;20 reps    Heel Raises Limitations  toe raises x 15    Hip Flexion  Right;Left;20 reps    Hip Flexion Limitations  alternating  4# each leg    Hip Abduction  Right;Left;15 reps    Abduction Limitations  4#    Hip Extension  Right;Left;15 reps;Knee straight    Extension Limitations  4#    Functional Squat  15 reps    Functional Squat Limitations  a t freemotion      Knee/Hip Exercises: Seated   Long Arc Quad  Right;20 reps      Knee/Hip Exercises: Supine   Short Arc Quad Sets  Right    Short Arc Quad Sets Limitations  6# x 25      Vasopneumatic   Number Minutes Vasopneumatic   15 minutes    Vasopnuematic Location   Knee    Vasopneumatic Pressure  Medium    Vasopneumatic Temperature   34 degrees      Manual Therapy   Joint Mobilization  AP glides for knee flexion followed by PROm, patella mobs all planes  and extension 30 sec holds                PT Short Term Goals - 05/09/19 1131      PT SHORT TERM GOAL #1   Title  He will be indpendent with initial hEp    Status  Achieved      PT SHORT TERM GOAL #2   Title  He will be walking out of home with least restrictive device    Baseline  uses SPC now but longer walks still with walker as he is on road    Status  Partially Met      PT SHORT TERM GOAL #3   Title  He will walk full time in home with [place]    Status  Achieved      PT SHORT TERM GOAL #4   Title  Active RT knee flexion to 100 degrees or better    Status  Achieved        PT Long Term Goals - 04/18/19 1328      PT LONG TERM GOAL #1   Title  He will be indpendent with all HEP  issued    Time  12    Period  Weeks    Status  New      PT LONG TERM GOAL #2   Title  He will walk in home no device and out of home least restricted  device    Time  12    Period  Weeks    Status  New      PT LONG TERM GOAL #3   Title  He will be indpendent with all normal home tasks    Time  12    Period  Weeks    Status  New      PT LONG TERM GOAL #4   Title  He will return to walking dog 1/4 mile for exercise    Time  12    Period  Weeks    Status  New      PT LONG TERM GOAL #5   Title  He will improve flexion to 1120 degrees active and extension to -5-10 degrees to improve gait pattern and  tolerance    Time  12    Period  Weeks    Status  New      Additional Long Term Goals   Additional Long Term Goals  Yes      PT LONG TERM GOAL #6   Title  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   Pain better and walking farther at thome and walking doge with him.  Overall he feels he is doing better.    PT Treatment/Interventions  Taping;Manual techniques;Passive range of motion;Patient/family education;Therapeutic exercise;Therapeutic activities;Stair training;Gait training;Vasopneumatic Device    PT Next Visit Plan  contniue strength , vaso, manual,  SPC gait    PT Home Exercise Plan  All HEP reviewed and pt independent (HHPT HEP quad set, SAQ, LAQ, assisted knee flexion in sitting, heel slides SLR, side hip abduction)    Consulted and Agree with Plan of Care  Patient       Patient will benefit from skilled therapeutic intervention in order to improve the following deficits and impairments:  Pain, Difficulty walking, Decreased range of motion, Decreased balance, Decreased strength, Increased edema  Visit Diagnosis: Acute pain of right knee  Localized edema  Difficulty in walking, not elsewhere classified  Stiffness of right knee, not elsewhere classified     Problem List Patient Active Problem List   Diagnosis Date Noted  . Primary osteoarthritis of right  knee 04/05/2019  . CKD (chronic kidney disease) stage 3, GFR 30-59 ml/min 03/14/2019  . Degenerative joint disease of knee, right 09/27/2018  . Dyspnea 09/06/2018  . Right knee pain 09/06/2018  . Radiculitis of left cervical region 03/05/2018  . Plantar fasciitis, left 08/19/2017  . Chest pain 02/27/2017  . Right ear pain 02/27/2017  . Left groin pain 09/13/2016  . Rib pain on left side 09/13/2016  . Left leg pain 09/13/2016  . Stenosis of cervical spine with myelopathy ([place]) 06/20/2016  . Special screening for malignant neoplasms, colon 04/04/2016  . Cough 01/11/2016  . COPD exacerbation ([place]) 12/22/2015  . Rectal bleeding 08/14/2014  . Intercostal muscle strain 08/07/2014  . Chronic meniscal tear of knee 04/06/2014  . Heel bone fracture 04/06/2014  .  Hyperglycemia 04/04/2014  . Recurrent falls 04/04/2014  . Bilateral knee pain 04/04/2014  . Intractable left heel pain 04/04/2014  . Insomnia 04/04/2014  . Hematochezia 04/04/2014  . Bladder neck obstruction 12/19/2013  . Encounter for therapeutic drug monitoring 03/30/2013  . Palpitations 07/06/2012  . Shortness of breath 01/19/2012  . Sick sinus syndrome ([place]) 12/01/2011  . S/P CABG x 1 11/26/2011  . S/P Maze operation for atrial fibrillation 11/26/2011  . CAD (coronary artery disease) 11/25/2011  . Paroxysmal atrial fibrillation ([place]) 11/24/2011  . Chronic combined systolic (congestive) and diastolic (congestive) heart failure ([place]) 11/24/2011  . Gross hematuria 12/12/2010  . Left knee pain 12/12/2010  . Left knee DJD 07/09/2010  . Preventative health care 07/07/2010  . Chronic anticoagulation 04/08/2010  . COPD (chronic obstructive pulmonary disease) ([place]) 10/04/2008  . OBSTRUCTIVE SLEEP APNEA 01/06/2008  . ALLERGIC RHINITIS 01/14/2007  . Hypothyroidism 09/28/2006  . Hyperlipidemia 09/28/2006  . Anxiety state 09/28/2006  . Essential hypertension 09/28/2006    Darrel Hoover  PT 05/12/2019, 11:27 AM  [facility] [address] [place], [place], [address] Phone: [phone]   Fax:  [phone]  Name: Scott [name]. MRN: [id number] Date of Birth: [birth year]

## 2019-05-16 ENCOUNTER — Ambulatory Visit (INDEPENDENT_AMBULATORY_CARE_PROVIDER_SITE_OTHER): Payer: PPO | Admitting: Internal Medicine

## 2019-05-16 ENCOUNTER — Other Ambulatory Visit: Payer: Self-pay

## 2019-05-16 ENCOUNTER — Encounter: Payer: Self-pay | Admitting: Internal Medicine

## 2019-05-16 VITALS — BP 118/76 | HR 78 | Resp 15 | Ht 66.0 in | Wt 198.8 lb

## 2019-05-16 DIAGNOSIS — I480 Paroxysmal atrial fibrillation: Secondary | ICD-10-CM | POA: Diagnosis not present

## 2019-05-16 DIAGNOSIS — I5042 Chronic combined systolic (congestive) and diastolic (congestive) heart failure: Secondary | ICD-10-CM | POA: Diagnosis not present

## 2019-05-16 DIAGNOSIS — I10 Essential (primary) hypertension: Secondary | ICD-10-CM | POA: Diagnosis not present

## 2019-05-16 NOTE — Patient Instructions (Addendum)
Medication Instructions:  Your physician recommends that you continue on your current medications as directed. Please refer to the Current Medication list given to you today.  Call Dr. Cathlean Cower to set up an appointment for wheezing.  [phone]   Labwork: None ordered.  Testing/Procedures: None ordered.  Follow-Up: Your physician wants you to follow-up in: one year with [name], PA.   You will receive a reminder letter in the mail two months in advance. If you don't receive a letter, please call our office to schedule the follow-up appointment.   Any Other Special Instructions Will Be Listed Below (If Applicable).  If you need a refill on your cardiac medications before your next appointment, please call your pharmacy.

## 2019-05-16 NOTE — Progress Notes (Signed)
HPI [name] returns today for followup. He is a pleasant middle aged man with multiple medical problems including PAF, HTN, and remote tachy induced CM. In the interim, he has done well though he underwent knee replacement surgery several week He has been a little slow getting back walking.  No chest pain or sob. No edema. His leg is a little sore. Allergies  Allergen Reactions  . Zolpidem Tartrate Other (See Comments)    Extremely drowsy the next day     Current Outpatient Medications  Medication Sig Dispense Refill  . acetaminophen (TYLENOL) 500 MG tablet Take 1,000 mg by mouth every 6 (six) hours as needed for mild pain.     Marland Kitchen ALPRAZolam (XANAX) 0.5 MG tablet TAKE 1 TABLET BY MOUTH TWICE A DAY AS NEEDED FOR ANXIETY (Patient taking differently: Take 0.5 mg by mouth 2 (two) times daily as needed for anxiety. ) 180 tablet 1  . apixaban (ELIQUIS) 5 MG TABS tablet Take 1 tablet (5 mg total) by mouth 2 (two) times daily. 180 tablet 1  . budesonide-formoterol (SYMBICORT) 160-4.5 MCG/ACT inhaler TAKE 2 PUFFS BY MOUTH TWICE A DAY (Patient taking differently: Inhale 2 puffs into the lungs 2 (two) times daily. ) 30.6 Inhaler 4  . cholecalciferol (VITAMIN D) 1000 units tablet Take 1,000 Units by mouth daily.    Marland Kitchen docusate sodium (COLACE) 100 MG capsule Take 200 mg by mouth daily as needed for mild constipation.     . furosemide (LASIX) 20 MG tablet Take 1 tablet (20 mg total) by mouth as needed. Call the office if needed > twice weekly. (Patient taking differently: Take 20 mg by mouth daily as needed for fluid. ) 90 tablet 3  . gabapentin (NEURONTIN) 100 MG capsule TAKE 1 CAPSULE BY MOUTH THREE TIMES A DAY (Patient taking differently: Take 100 mg by mouth 3 (three) times daily. ) 270 capsule 2  . lovastatin (MEVACOR) 40 MG tablet TAKE 2 TABLETS (80 MG TOTAL) BY MOUTH EVERY EVENING. 180 tablet 2  . metoprolol succinate (TOPROL-XL) 50 MG 24 hr tablet TAKE 1 AND 1/2 TABLETS BY MOUTH DAILY  (Patient taking differently: Take 75 mg by mouth daily. ) 135 tablet 1  . Multiple Vitamin (MULTIVITAMIN WITH MINERALS) TABS tablet Take 1 tablet by mouth daily.     . Olmesartan-amLODIPine-HCTZ 40-5-12.5 MG TABS TAKE 1 TABLET BY MOUTH EVERY DAY (Patient taking differently: Take 1 tablet by mouth daily. ) 90 tablet 1  . PROAIR HFA 108 (90 Base) MCG/ACT inhaler TAKE 2 PUFFS BY MOUTH EVERY 6 HOURS AS NEEDED FOR WHEEZE OR SHORTNESS OF BREATH (Patient taking differently: Inhale 2 puffs into the lungs every 6 (six) hours as needed for wheezing or shortness of breath. ) 8.5 Inhaler 11  . spironolactone (ALDACTONE) 25 MG tablet Take 0.5 tablets (12.5 mg total) by mouth at bedtime. 45 tablet 3  . temazepam (RESTORIL) 30 MG capsule Take 1 capsule (30 mg total) by mouth at bedtime as needed. for sleep (Patient taking differently: Take 30 mg by mouth at bedtime. for sleep) 90 capsule 2  . tiZANidine (ZANAFLEX) 4 MG tablet Take 1 tablet (4 mg total) by mouth every 6 (six) hours as needed for muscle spasms. 40 tablet 1  . traMADol (ULTRAM) 50 MG tablet Take 50 mg by mouth every 6 (six) hours as needed.     No current facility-administered medications for this visit.     Past Medical History:  Diagnosis Date  . ALLERGIC  RHINITIS 01/14/2007  . ANXIETY 09/28/2006  . Asbestos exposure   . Atrial fibrillation ([place]) 09/28/2006   s/p afib ablation x 2  . CHF (congestive heart failure) ([place])   . CKD (chronic kidney disease), stage III    patient denies  . COPD (chronic obstructive pulmonary disease) ([place])   . Coronary artery disease 11/24/2011  . Diastolic dysfunction [id number]  . Dyspnea    W/ EXERTION   . Dysrhythmia    AFIB     . First degree AV block 10/12/2018   Noted on EKG  . Gallstones   . Hyperglycemia   . HYPERLIPIDEMIA 09/28/2006  . HYPERTENSION 09/28/2006  . HYPOTHYROIDISM, ACQUIRED NEC 09/28/2006   pt denies and doesn't know  . Long term current use of anticoagulant 04/08/2010  . Obese   .  OBSTRUCTIVE SLEEP APNEA 01/06/2008   NOT USING    . Right knee DJD 07/09/2010  . S/P CABG x 1 11/26/2011   Right internal mammary artery to right coronary artery  . S/P Maze operation for atrial fibrillation 11/26/2011   complete biatrial lesion set with clipping of LA appendage    ROS:   All systems reviewed and negative except as noted in the HPI.   Past Surgical History:  Procedure Laterality Date  . ANTERIOR CERVICAL DECOMP/DISCECTOMY FUSION N/A 06/20/2016   Procedure: Anterior Cervical Discectomy Fusion - Cervical five-Cervical six - Cervical six-Cervical seven - Cervicla seven- Thoracic one;  Surgeon: Earnie Larsson, MD;  Location: [facility];  Service: Neurosurgery;  Laterality: N/A;  . CARDIOVERSION     X4   . CORONARY ARTERY BYPASS GRAFT  11/26/2011   Procedure: CORONARY ARTERY BYPASS GRAFTING (CABG);  Surgeon: Rexene Alberts, MD;  Location: [facility];  Service: Open Heart Surgery;  Laterality: N/A;  coronary artery bypass on pump times one utilizing the right internal mammary artery, transesophageal echocardiogram   . HAND SURGERY Left   . LEFT HEART CATHETERIZATION WITH CORONARY ANGIOGRAM N/A 11/24/2011   Procedure: LEFT HEART CATHETERIZATION WITH CORONARY ANGIOGRAM;  Surgeon: Peter M Martinique, MD;  Location: [facility] CATH LAB;  Service: Cardiovascular;  Laterality: N/A;  . LOOP RECORDER IMPLANT N/A 07/06/2012   Procedure: LOOP RECORDER IMPLANT;  Surgeon: Thompson Grayer, MD;  Location: [facility] CATH LAB;  Service: Cardiovascular;  Laterality: N/A;  . MAZE  11/26/2011   Procedure: MAZE;  Surgeon: Rexene Alberts, MD;  Location: [place] [facility];  Service: Open Heart Surgery;  Laterality: N/A;  Cryomaze   . Radiofrequency Ablation for atrial fibrillation  12/30/07 and 06/05/09   afib ablation x 2 by JA  . Radiofrequency ablation for atrial flutter  2005   CTI  . TOTAL KNEE ARTHROPLASTY Right 04/05/2019   Procedure: RIGHT TOTAL KNEE ARTHROPLASTY;  Surgeon: Melrose Nakayama, MD;  Location[facility];  Service: Orthopedics;   Laterality: Right;     Family History  Problem Relation Age of Onset  . Dementia Father   . Hypertension Father   . Atrial fibrillation Mother   . Stroke Mother   . Dementia Paternal Grandfather   . Cancer Paternal Grandmother        type unknown     Social History   Socioeconomic History  . Marital status: Single    Spouse name: Not on file  . Number of children: 0  . Years of education: Not on file  . Highest education level: Not on file  Occupational History  . Occupation: works at the [employer]  .  Smoking status: Former Smoker    Packs/day: 1.50    Years: 26.00    Pack years: 39.00    Types: Cigarettes    Quit date: 04/06/1995    Years since quitting: 24.1  . Smokeless tobacco: Never Used  Substance and Sexual Activity  . Alcohol use: No    Alcohol/week: 0.0 standard drinks  . Drug use: No  . Sexual activity: Yes  Other Topics Concern  . Not on file  Social History Narrative   Pt lives in [place], [place] with his friend.    Social Determinants of Health   Financial Resource Strain:   . Difficulty of Paying Living Expenses:   Food Insecurity:   . Worried About Charity fundraiser in the Last Year:   . Arboriculturist in the Last Year:   Transportation Needs:   . Film/video editor (Medical):   Marland Kitchen Lack of Transportation (Non-Medical):   Physical Activity: Sufficiently Active  . Days of Exercise per Week: 5 days  . Minutes of Exercise per Session: 80 min  Stress:   . Feeling of Stress :   Social Connections:   . Frequency of Communication with Friends and Family:   . Frequency of Social Gatherings with Friends and Family:   . Attends Religious Services:   . Active Member of Clubs or Organizations:   . Attends Archivist Meetings:   Marland Kitchen Marital Status:   Intimate Partner Violence:   . Fear of Current or Ex-Partner:   . Emotionally Abused:   Marland Kitchen Physically Abused:   . Sexually Abused:      BP 118/76   Pulse 78    Resp 15   Ht 5\' 6"  (1.676 m)   Wt 198 lb 12.8 oz (90.2 kg)   SpO2 97%   BMI 32.09 kg/m   Physical Exam:  Well appearing NAD HEENT: Unremarkable Neck:  No JVD, no thyromegally Lymphatics:  No adenopathy Back:  No CVA tenderness Lungs:  Clear with no wheezes HEART:  Regular rate rhythm, no murmurs, no rubs, no clicks Abd:  soft, positive bowel sounds, no organomegally, no rebound, no guarding Ext:  2 plus pulses, no edema, no cyanosis, no clubbing Skin:  No rashes no nodules Neuro:  CN II through XII intact, motor grossly intact  Assess/Plan: 1. PAF - he appears to be mostly maintaining NSR. He has minimal palpitations. 2. Obesity - he has gained 20 lbs since he was able to walk less well. I encouraged him to try and lose weight. 3. HTN - his bp is well controlled. No change in meds. 4. Chronic diastolic heart failure - his symptoms are class 1. He will continue his current meds. He is encouraged to start walking again and maintain a low sodium diet.  Mikle Bosworth.D.

## 2019-05-17 ENCOUNTER — Ambulatory Visit: Payer: PPO

## 2019-05-17 DIAGNOSIS — M25561 Pain in right knee: Secondary | ICD-10-CM | POA: Diagnosis not present

## 2019-05-17 DIAGNOSIS — R600 Localized edema: Secondary | ICD-10-CM

## 2019-05-17 DIAGNOSIS — R262 Difficulty in walking, not elsewhere classified: Secondary | ICD-10-CM

## 2019-05-17 DIAGNOSIS — M25661 Stiffness of right knee, not elsewhere classified: Secondary | ICD-10-CM

## 2019-05-17 NOTE — Therapy (Signed)
[place], [place], [address] Phone: [phone]   Fax:  [phone]  Physical Therapy Treatment  Patient Details  Name: Scott Thim. MRN: [id number] Date of Birth: Nov 20, 1955 Referring Provider (PT): Genevive Bi   Encounter Date: 05/17/2019  PT End of Session - 05/17/19 1008    Visit Number  9    Number of Visits  24    Date for PT Re-Evaluation  07/08/19    Authorization Type  Health team advantage    Authorization Time Period  progress visit 10       KX visit 15    PT Start Time  1000    PT Stop Time  0155    PT Time Calculation (min)  955 min    Activity Tolerance  Patient tolerated treatment well;Patient limited by pain    Behavior During Therapy  [facility] for tasks assessed/performed       Past Medical History:  Diagnosis Date  . ALLERGIC RHINITIS 01/14/2007  . ANXIETY 09/28/2006  . Asbestos exposure   . Atrial fibrillation ([place]) 09/28/2006   s/p afib ablation x 2  . CHF (congestive heart failure) ([place])   . CKD (chronic kidney disease), stage III    patient denies  . COPD (chronic obstructive pulmonary disease) ([place])   . Coronary artery disease 11/24/2011  . Diastolic dysfunction 99991111  . Dyspnea    W/ EXERTION   . Dysrhythmia    AFIB     . First degree AV block 10/12/2018   Noted on EKG  . Gallstones   . Hyperglycemia   . HYPERLIPIDEMIA 09/28/2006  . HYPERTENSION 09/28/2006  . HYPOTHYROIDISM, ACQUIRED NEC 09/28/2006   pt denies and doesn't know  . Long term current use of anticoagulant 04/08/2010  . Obese   . OBSTRUCTIVE SLEEP APNEA 01/06/2008   NOT USING    . Right knee DJD 07/09/2010  . S/P CABG x 1 11/26/2011   Right internal mammary artery to right coronary artery  . S/P Maze operation for atrial fibrillation 11/26/2011   complete biatrial lesion set with clipping of LA appendage    Past Surgical History:  Procedure Laterality Date  . ANTERIOR CERVICAL DECOMP/DISCECTOMY  FUSION N/A 06/20/2016   Procedure: Anterior Cervical Discectomy Fusion - Cervical five-Cervical six - Cervical six-Cervical seven - Cervicla seven- Thoracic one;  Surgeon: Earnie Larsson, MD;  Location: [place];  Service: Neurosurgery;  Laterality: N/A;  . CARDIOVERSION     X4   . CORONARY ARTERY BYPASS GRAFT  11/26/2011   Procedure: CORONARY ARTERY BYPASS GRAFTING (CABG);  Surgeon: Rexene Alberts, MD;  Location: [place];  Service: Open Heart Surgery;  Laterality: N/A;  coronary artery bypass on pump times one utilizing the right internal mammary artery, transesophageal echocardiogram   . HAND SURGERY Left   . LEFT HEART CATHETERIZATION WITH CORONARY ANGIOGRAM N/A 11/24/2011   Procedure: LEFT HEART CATHETERIZATION WITH CORONARY ANGIOGRAM;  Surgeon: Peter M Martinique, MD;  Location[facility] LAB;  Service: Cardiovascular;  Laterality: N/A;  . LOOP RECORDER IMPLANT N/A 07/06/2012   Procedure: LOOP RECORDER IMPLANT;  Surgeon: Thompson Grayer, MD;  Location: [facility] CATH LAB;  Service: Cardiovascular;  Laterality: N/A;  . MAZE  11/26/2011   Procedure: MAZE;  Surgeon: Rexene Alberts, MD;  Location: [place];  Service: Open Heart Surgery;  Laterality: N/A;  Cryomaze   . Radiofrequency Ablation for atrial fibrillation  12/30/07 and 06/05/09   afib ablation x 2 by  JA  . Radiofrequency ablation for atrial flutter  2005   CTI  . TOTAL KNEE ARTHROPLASTY Right 04/05/2019   Procedure: RIGHT TOTAL KNEE ARTHROPLASTY;  Surgeon: Melrose Nakayama, MD;  [facility];  Service: Orthopedics;  Laterality: Right;    There were no vitals filed for this visit.  Subjective Assessment - 05/17/19 1005    Subjective  He feels he overdid it with alot of walking this weekend and now posterior RT knee increased pain.     He walked farther (3x) than normal.    Pain Score  7     Pain Location  Knee    Pain Orientation  Right;Anterior;Posterior    Pain Descriptors / Indicators  Aching;Sore    Pain Type  Surgical pain         [facility] PT  Assessment - 05/17/19 0001      Circumferential Edema   Circumferential - Right  45 cm      AROM   Right Knee Extension  -10    Right Knee Flexion  124                   [facility] Adult PT Treatment/Exercise - 05/17/19 0001      Knee/Hip Exercises: Aerobic   Nustep  L6 UE/LE 7 minutes       Knee/Hip Exercises: Standing   Heel Raises  Both;20 reps    Heel Raises Limitations  toe raises x 15    Hip Flexion  Right;Left;20 reps    Hip Flexion Limitations  alternating  4# each leg    Hip Abduction  Right;Left;15 reps    Abduction Limitations  4#    Hip Extension  Right;Left;15 reps;Knee straight    Extension Limitations  4#    Functional Squat  15 reps    Functional Squat Limitations  a t freemotion      Knee/Hip Exercises: Supine   Short Arc Quad Sets  Right    Short Arc Quad Sets Limitations  8# 2x10    Straight Leg Raises  Right;20 reps      Vasopneumatic   Number Minutes Vasopneumatic   15 minutes    Vasopnuematic Location   Knee    Vasopneumatic Pressure  Medium    Vasopneumatic Temperature   34 degrees               PT Short Term Goals - 05/17/19 1009      PT SHORT TERM GOAL #1   Title  He will be indpendent with initial hEp    Status  Achieved      PT SHORT TERM GOAL #2   Title  He will be walking out of home with least restrictive device    Status  Achieved      PT SHORT TERM GOAL #3   Title  He will walk full time in home with [facility]    Status  Achieved      PT SHORT TERM GOAL #4   Title  Active RT knee flexion to 100 degrees or better    Status  Achieved        PT Long Term Goals - 04/18/19 1328      PT LONG TERM GOAL #1   Title  He will be indpendent with all HEP issued    Time  12    Period  Weeks    Status  New      PT LONG TERM GOAL #2   Title  He  will walk in home no device and out of home least restricted device    Time  12    Period  Weeks    Status  New      PT LONG TERM GOAL #3   Title  He will be indpendent with  all normal home tasks    Time  12    Period  Weeks    Status  New      PT LONG TERM GOAL #4   Title  He will return to walking dog 1/4 mile for exercise    Time  12    Period  Weeks    Status  New      PT LONG TERM GOAL #5   Title  He will improve flexion to 1120 degrees active and extension to -5-10 degrees to improve gait pattern and  tolerance    Time  12    Period  Weeks    Status  New      Additional Long Term Goals   Additional Long Term Goals  Yes      PT LONG TERM GOAL #6   Title  He will return to work at [employer]    Time  12    Period  Weeks    Status  New            Plan - 05/17/19 1009    Clinical Impression Statement  He pushed his activity and has more pain.    no significant changes in symptoms and ROM still good and gait not changed Will work wit pain by modifying if needed. Asked him to ice and elevate more of rthis week    PT Treatment/Interventions  Taping;Manual techniques;Passive range of motion;Patient/family education;Therapeutic exercise;Therapeutic activities;Stair training;Gait training;Vasopneumatic Device    PT Next Visit Plan  contniue strength , vaso, manual,  SPC gait    PT Home Exercise Plan  All HEP reviewed and pt independent (HHPT HEP quad set, SAQ, LAQ, assisted knee flexion in sitting, heel slides SLR, side hip abduction)    Consulted and Agree with Plan of Care  Patient       Patient will benefit from skilled therapeutic intervention in order to improve the following deficits and impairments:  Pain, Difficulty walking, Decreased range of motion, Decreased balance, Decreased strength, Increased edema  Visit Diagnosis: Acute pain of right knee  Localized edema  Difficulty in walking, not elsewhere classified  Stiffness of right knee, not elsewhere classified     Problem List Patient Active Problem List   Diagnosis Date Noted  . Primary osteoarthritis of right knee 04/05/2019  . CKD (chronic kidney disease) stage 3,  GFR 30-59 ml/min 03/14/2019  . Degenerative joint disease of knee, right 09/27/2018  . Dyspnea 09/06/2018  . Right knee pain 09/06/2018  . Radiculitis of left cervical region 03/05/2018  . Plantar fasciitis, left 08/19/2017  . Chest pain 02/27/2017  . Right ear pain 02/27/2017  . Left groin pain 09/13/2016  . Rib pain on left side 09/13/2016  . Left leg pain 09/13/2016  . Stenosis of cervical spine with myelopathy ([place]) 06/20/2016  . Special screening for malignant neoplasms, colon 04/04/2016  . Cough 01/11/2016  . COPD exacerbation ([place]) 12/22/2015  . Rectal bleeding 08/14/2014  . Intercostal muscle strain 08/07/2014  . Chronic meniscal tear of knee 04/06/2014  . Heel bone fracture 04/06/2014  . Hyperglycemia 04/04/2014  . Recurrent falls 04/04/2014  . Bilateral knee pain 04/04/2014  . Intractable left heel  pain 04/04/2014  . Insomnia 04/04/2014  . Hematochezia 04/04/2014  . Bladder neck obstruction 12/19/2013  . Encounter for therapeutic drug monitoring 03/30/2013  . Palpitations 07/06/2012  . Shortness of breath 01/19/2012  . Sick sinus syndrome ([place]) 12/01/2011  . S/P CABG x 1 11/26/2011  . S/P Maze operation for atrial fibrillation 11/26/2011  . CAD (coronary artery disease) 11/25/2011  . Paroxysmal atrial fibrillation ([place]) 11/24/2011  . Chronic combined systolic (congestive) and diastolic (congestive) heart failure ([place]) 11/24/2011  . Gross hematuria 12/12/2010  . Left knee pain 12/12/2010  . Left knee DJD 07/09/2010  . Preventative health care 07/07/2010  . Chronic anticoagulation 04/08/2010  . COPD (chronic obstructive pulmonary disease) ([place]) 10/04/2008  . OBSTRUCTIVE SLEEP APNEA 01/06/2008  . ALLERGIC RHINITIS 01/14/2007  . Hypothyroidism 09/28/2006  . Hyperlipidemia 09/28/2006  . Anxiety state 09/28/2006  . Essential hypertension 09/28/2006    Darrel Hoover PT 05/17/2019, 10:44 AM  [facility] [address] [place], [place], [address] Phone: [phone]   Fax:  [phone]  Name: Scott [name]. MRN: [id number] Date of Birth: Apr 01, 1955

## 2019-05-19 ENCOUNTER — Ambulatory Visit: Payer: PPO

## 2019-05-19 ENCOUNTER — Other Ambulatory Visit: Payer: Self-pay

## 2019-05-19 DIAGNOSIS — M25561 Pain in right knee: Secondary | ICD-10-CM

## 2019-05-19 DIAGNOSIS — R600 Localized edema: Secondary | ICD-10-CM

## 2019-05-19 DIAGNOSIS — M25661 Stiffness of right knee, not elsewhere classified: Secondary | ICD-10-CM

## 2019-05-19 DIAGNOSIS — R262 Difficulty in walking, not elsewhere classified: Secondary | ICD-10-CM

## 2019-05-19 NOTE — Therapy (Signed)
[place], [place], [address] Phone: [phone]   Fax:  [phone]  Physical Therapy Treatment  Patient Details  Name: Scott [name]. MRN: [id number] Date of Birth: Apr 29, 1955 Referring Provider (PT): Genevive Bi Progress Note Reporting Period 04/18/19 to 05/19/19  See note below for Objective Data and Assessment of Progress/Goals.       Encounter Date: 05/19/2019  PT End of Session - 05/19/19 1046    Visit Number  10    Number of Visits  24    Date for PT Re-Evaluation  07/08/19    Authorization Type  Health team advantage    Authorization Time Period  progress visit 10       KX visit 15    PT Start Time  0950    PT Stop Time  1050    PT Time Calculation (min)  60 min    Activity Tolerance  Patient tolerated treatment well;Patient limited by pain    Behavior During Therapy  [facility] for tasks assessed/performed       Past Medical History:  Diagnosis Date  . ALLERGIC RHINITIS 01/14/2007  . ANXIETY 09/28/2006  . Asbestos exposure   . Atrial fibrillation ([place]) 09/28/2006   s/p afib ablation x 2  . CHF (congestive heart failure) ([place])   . CKD (chronic kidney disease), stage III    patient denies  . COPD (chronic obstructive pulmonary disease) ([place])   . Coronary artery disease 11/24/2011  . Diastolic dysfunction 99991111  . Dyspnea    W/ EXERTION   . Dysrhythmia    AFIB     . First degree AV block 10/12/2018   Noted on EKG  . Gallstones   . Hyperglycemia   . HYPERLIPIDEMIA 09/28/2006  . HYPERTENSION 09/28/2006  . HYPOTHYROIDISM, ACQUIRED NEC 09/28/2006   pt denies and doesn't know  . Long term current use of anticoagulant 04/08/2010  . Obese   . OBSTRUCTIVE SLEEP APNEA 01/06/2008   NOT USING    . Right knee DJD 07/09/2010  . S/P CABG x 1 11/26/2011   Right internal mammary artery to right coronary artery  . S/P Maze operation for atrial fibrillation 11/26/2011   complete biatrial lesion  set with clipping of LA appendage    Past Surgical History:  Procedure Laterality Date  . ANTERIOR CERVICAL DECOMP/DISCECTOMY FUSION N/A 06/20/2016   Procedure: Anterior Cervical Discectomy Fusion - Cervical five-Cervical six - Cervical six-Cervical seven - Cervicla seven- Thoracic one;  Surgeon: Earnie Larsson, MD;  Location: [place];  Service: Neurosurgery;  Laterality: N/A;  . CARDIOVERSION     X4   . CORONARY ARTERY BYPASS GRAFT  11/26/2011   Procedure: CORONARY ARTERY BYPASS GRAFTING (CABG);  Surgeon: Rexene Alberts, MD;  Location: [place];  Service: Open Heart Surgery;  Laterality: N/A;  coronary artery bypass on pump times one utilizing the right internal mammary artery, transesophageal echocardiogram   . HAND SURGERY Left   . LEFT HEART CATHETERIZATION WITH CORONARY ANGIOGRAM N/A 11/24/2011   Procedure: LEFT HEART CATHETERIZATION WITH CORONARY ANGIOGRAM;  Surgeon: Peter M Martinique, MD;  Location: [facility] CATH LAB;  Service: Cardiovascular;  Laterality: N/A;  . LOOP RECORDER IMPLANT N/A 07/06/2012   Procedure: LOOP RECORDER IMPLANT;  Surgeon: Thompson Grayer, MD;  Location: [facility] CATH LAB;  Service: Cardiovascular;  Laterality: N/A;  . MAZE  11/26/2011   Procedure: MAZE;  Surgeon: Rexene Alberts, MD;  Location: [place];  Service: Open Heart Surgery;  Laterality:  N/A;  Cryomaze   . Radiofrequency Ablation for atrial fibrillation  12/30/07 and 06/05/09   afib ablation x 2 by JA  . Radiofrequency ablation for atrial flutter  2005   CTI  . TOTAL KNEE ARTHROPLASTY Right 04/05/2019   Procedure: RIGHT TOTAL KNEE ARTHROPLASTY;  Surgeon: Melrose Nakayama, MD;  [facility];  Service: Orthopedics;  Laterality: Right;    There were no vitals filed for this visit.  Subjective Assessment - 05/19/19 0954    Subjective  No better or worse today . Did alot of walking yesterday so i is sore in psoterior RT knee.    Pain Score  7     Pain Location  Knee    Pain Orientation  Right;Anterior;Posterior         [facility] PT  Assessment - 05/19/19 0001      AROM   Right Knee Flexion  130                   [facility] Adult PT Treatment/Exercise - 05/19/19 0001      Knee/Hip Exercises: Aerobic   Nustep  L6 UE/LE 10 minutes       Knee/Hip Exercises: Standing   Heel Raises  Both;20 reps      Knee/Hip Exercises: Seated   Long Arc Quad Weight  8 lbs.    Long [employer] Limitations  30 reps RT      Knee/Hip Exercises: Supine   Quad Sets  Right;15 reps    [employer] Limitations  10 sec, cued to max effort    Bridges with Clamshell  Both;15 reps    Straight Leg Raises  Right;20 reps      Vasopneumatic   Number Minutes Vasopneumatic   15 minutes    Vasopnuematic Location   Knee    Vasopneumatic Pressure  Medium    Vasopneumatic Temperature   34 degrees      Manual Therapy   Joint Mobilization  STW to posterior medial hamstring and use of  tennis ball for STW at home most in sitting             PT Education - 05/19/19 1046    Education Details  Use of tennis ball for STW to posterior RT knee.    Person(s) Educated  Patient    Methods  Explanation;Demonstration    Comprehension  Verbalized understanding;Returned demonstration       PT Short Term Goals - 05/17/19 1009      PT SHORT TERM GOAL #1   Title  He will be indpendent with initial hEp    Status  Achieved      PT SHORT TERM GOAL #2   Title  He will be walking out of home with least restrictive device    Status  Achieved      PT SHORT TERM GOAL #3   Title  He will walk full time in home with [facility]    Status  Achieved      PT SHORT TERM GOAL #4   Title  Active RT knee flexion to 100 degrees or better    Status  Achieved        PT Long Term Goals - 04/18/19 1328      PT LONG TERM GOAL #1   Title  He will be indpendent with all HEP issued    Time  12    Period  Weeks    Status  New      PT LONG TERM  GOAL #2   Title  He will walk in home no device and out of home least restricted device    Time  12    Period  Weeks     Status  New      PT LONG TERM GOAL #3   Title  He will be indpendent with all normal home tasks    Time  12    Period  Weeks    Status  New      PT LONG TERM GOAL #4   Title  He will return to walking dog 1/4 mile for exercise    Time  12    Period  Weeks    Status  New      PT LONG TERM GOAL #5   Title  He will improve flexion to 1120 degrees active and extension to -5-10 degrees to improve gait pattern and  tolerance    Time  12    Period  Weeks    Status  New      Additional Long Term Goals   Additional Long Term Goals  Yes      PT LONG TERM GOAL #6   Title  He will return to work at [employer]    Time  12    Period  Weeks    Status  New            Plan - 05/19/19 1047    Clinical Impression Statement  Flexion ROM improved and worked on STW to postretior RT knee . Quite tender to palpation so will work onthis more.    PT Treatment/Interventions  Taping;Manual techniques;Passive range of motion;Patient/family education;Therapeutic exercise;Therapeutic activities;Stair training;Gait training;Vasopneumatic Device    PT Next Visit Plan  contniue strength , vaso, manual, STW post knee    PT Home Exercise Plan  All HEP reviewed and pt independent (HHPT HEP quad set, SAQ, LAQ, assisted knee flexion in sitting, heel slides SLR, side hip abduction)  Tennis ball STW    Consulted and Agree with Plan of Care  Patient       Patient will benefit from skilled therapeutic intervention in order to improve the following deficits and impairments:  Pain, Difficulty walking, Decreased range of motion, Decreased balance, Decreased strength, Increased edema  Visit Diagnosis: Difficulty in walking, not elsewhere classified  Stiffness of right knee, not elsewhere classified  Acute pain of right knee  Localized edema     Problem List Patient Active Problem List   Diagnosis Date Noted  . Primary osteoarthritis of right knee 04/05/2019  . CKD (chronic kidney disease) stage  3, GFR 30-59 ml/min 03/14/2019  . Degenerative joint disease of knee, right 09/27/2018  . Dyspnea 09/06/2018  . Right knee pain 09/06/2018  . Radiculitis of left cervical region 03/05/2018  . Plantar fasciitis, left 08/19/2017  . Chest pain 02/27/2017  . Right ear pain 02/27/2017  . Left groin pain 09/13/2016  . Rib pain on left side 09/13/2016  . Left leg pain 09/13/2016  . Stenosis of cervical spine with myelopathy ([place]) 06/20/2016  . Special screening for malignant neoplasms, colon 04/04/2016  . Cough 01/11/2016  . COPD exacerbation ([place]) 12/22/2015  . Rectal bleeding 08/14/2014  . Intercostal muscle strain 08/07/2014  . Chronic meniscal tear of knee 04/06/2014  . Heel bone fracture 04/06/2014  . Hyperglycemia 04/04/2014  . Recurrent falls 04/04/2014  . Bilateral knee pain 04/04/2014  . Intractable left heel pain 04/04/2014  . Insomnia 04/04/2014  . Hematochezia 04/04/2014  .  Bladder neck obstruction 12/19/2013  . Encounter for therapeutic drug monitoring 03/30/2013  . Palpitations 07/06/2012  . Shortness of breath 01/19/2012  . Sick sinus syndrome ([place]) 12/01/2011  . S/P CABG x 1 11/26/2011  . S/P Maze operation for atrial fibrillation 11/26/2011  . CAD (coronary artery disease) 11/25/2011  . Paroxysmal atrial fibrillation ([place]) 11/24/2011  . Chronic combined systolic (congestive) and diastolic (congestive) heart failure ([place]) 11/24/2011  . Gross hematuria 12/12/2010  . Left knee pain 12/12/2010  . Left knee DJD 07/09/2010  . Preventative health care 07/07/2010  . Chronic anticoagulation 04/08/2010  . COPD (chronic obstructive pulmonary disease) ([place]) 10/04/2008  . OBSTRUCTIVE SLEEP APNEA 01/06/2008  . ALLERGIC RHINITIS 01/14/2007  . Hypothyroidism 09/28/2006  . Hyperlipidemia 09/28/2006  . Anxiety state 09/28/2006  . Essential hypertension 09/28/2006    Darrel Hoover  PT 05/19/2019, 10:56 AM  [facility] [address] [place], [place], [address] Phone: [phone]   Fax:  [phone]  Name: Chayden [place]. MRN: [id number] Date of Birth: 02-Jul-1955

## 2019-05-23 ENCOUNTER — Ambulatory Visit: Payer: PPO

## 2019-05-23 ENCOUNTER — Other Ambulatory Visit: Payer: Self-pay

## 2019-05-23 ENCOUNTER — Ambulatory Visit: Payer: PPO | Admitting: Physical Therapy

## 2019-05-23 ENCOUNTER — Encounter: Payer: Self-pay | Admitting: Physical Therapy

## 2019-05-23 DIAGNOSIS — R262 Difficulty in walking, not elsewhere classified: Secondary | ICD-10-CM

## 2019-05-23 DIAGNOSIS — M25561 Pain in right knee: Secondary | ICD-10-CM

## 2019-05-23 DIAGNOSIS — M25661 Stiffness of right knee, not elsewhere classified: Secondary | ICD-10-CM

## 2019-05-23 DIAGNOSIS — R600 Localized edema: Secondary | ICD-10-CM

## 2019-05-23 NOTE — Patient Instructions (Signed)
Access Code: [email or web address]: [email or web address]: 03/29/2021Prepared by: Anderson Malta PaaExercises  Supine ITB Stretch with Strap - 2 x daily - 7 x weekly - 1 sets - 5 reps - 30 hold

## 2019-05-23 NOTE — Therapy (Signed)
[place], [place], [address] Phone: [phone]   Fax:  [phone]  Physical Therapy Treatment  Patient Details  Name: Scott [name]. MRN: [id number] Date of Birth: November 05, 1955 Referring Provider (PT): Genevive Bi   Encounter Date: 05/23/2019  PT End of Session - 05/23/19 1019    Visit Number  11    Number of Visits  24    Date for PT Re-Evaluation  07/08/19    Authorization Type  Health team advantage    Authorization Time Period  progress visit 10       KX visit 15    PT Start Time  1002    PT Stop Time  1045    PT Time Calculation (min)  43 min    Activity Tolerance  Patient tolerated treatment well    Behavior During Therapy  WFL for tasks assessed/performed       Past Medical History:  Diagnosis Date  . ALLERGIC RHINITIS 01/14/2007  . ANXIETY 09/28/2006  . Asbestos exposure   . Atrial fibrillation ([place]) 09/28/2006   s/p afib ablation x 2  . CHF (congestive heart failure) ([place])   . CKD (chronic kidney disease), stage III    patient denies  . COPD (chronic obstructive pulmonary disease) ([place])   . Coronary artery disease 11/24/2011  . Diastolic dysfunction 03/04/3788  . Dyspnea    W/ EXERTION   . Dysrhythmia    AFIB     . First degree AV block 10/12/2018   Noted on EKG  . Gallstones   . Hyperglycemia   . HYPERLIPIDEMIA 09/28/2006  . HYPERTENSION 09/28/2006  . HYPOTHYROIDISM, ACQUIRED NEC 09/28/2006   pt denies and doesn't know  . Long term current use of anticoagulant 04/08/2010  . Obese   . OBSTRUCTIVE SLEEP APNEA 01/06/2008   NOT USING    . Right knee DJD 07/09/2010  . S/P CABG x 1 11/26/2011   Right internal mammary artery to right coronary artery  . S/P Maze operation for atrial fibrillation 11/26/2011   complete biatrial lesion set with clipping of LA appendage    Past Surgical History:  Procedure Laterality Date  . ANTERIOR CERVICAL DECOMP/DISCECTOMY FUSION N/A 06/20/2016   Procedure: Anterior Cervical Discectomy Fusion - Cervical five-Cervical six - Cervical six-Cervical seven - Cervicla seven- Thoracic one;  Surgeon: Earnie Larsson, MD;  Location: [place];  Service: Neurosurgery;  Laterality: N/A;  . CARDIOVERSION     X4   . CORONARY ARTERY BYPASS GRAFT  11/26/2011   Procedure: CORONARY ARTERY BYPASS GRAFTING (CABG);  Surgeon: Rexene Alberts, MD;  Location: [place];  Service: Open Heart Surgery;  Laterality: N/A;  coronary artery bypass on pump times one utilizing the right internal mammary artery, transesophageal echocardiogram   . HAND SURGERY Left   . LEFT HEART CATHETERIZATION WITH CORONARY ANGIOGRAM N/A 11/24/2011   Procedure: LEFT HEART CATHETERIZATION WITH CORONARY ANGIOGRAM;  Surgeon: Peter M Martinique, MD;  Location: [facility] CATH LAB;  Service: Cardiovascular;  Laterality: N/A;  . LOOP RECORDER IMPLANT N/A 07/06/2012   Procedure: LOOP RECORDER IMPLANT;  Surgeon: Thompson Grayer, MD;  Location: [facility] CATH LAB;  Service: Cardiovascular;  Laterality: N/A;  . MAZE  11/26/2011   Procedure: MAZE;  Surgeon: Rexene Alberts, MD;  Location: [place];  Service: Open Heart Surgery;  Laterality: N/A;  Cryomaze   . Radiofrequency Ablation for atrial fibrillation  12/30/07 and 06/05/09   afib ablation x 2 by JA  . Radiofrequency  ablation for atrial flutter  2005   CTI  . TOTAL KNEE ARTHROPLASTY Right 04/05/2019   Procedure: RIGHT TOTAL KNEE ARTHROPLASTY;  Surgeon: Melrose Nakayama, MD;  [facility];  Service: Orthopedics;  Laterality: Right;    There were no vitals filed for this visit.  Subjective Assessment - 05/23/19 1000    Subjective  I woke up Saturday and decided I didn't need my cane.  Walked the dog 2 miles. I walk slow!    Currently in Pain?  Yes    Pain Score  3     Pain Location  Knee    Pain Orientation  Left    Pain Descriptors / Indicators  Tightness;Shooting    Pain Type  Surgical pain    Pain Radiating Towards  post knee    Pain Onset  More than a month ago    Pain  Frequency  Intermittent         [facility] PT Assessment - 05/23/19 0001      AROM   Right Knee Extension  6    Right Knee Flexion  125      Strength   Right Knee Flexion  4+/5    Right Knee Extension  4/5      Transfers   Five time sit to stand comments   16.7 sec, needs UE support of chair arms           [facility] Adult PT Treatment/Exercise - 05/23/19 0001      Knee/Hip Exercises: Stretches   Active Hamstring Stretch  Right;3 reps;30 seconds    ITB Stretch  Right;3 reps;30 seconds      Knee/Hip Exercises: Aerobic   Stationary Bike  --    Nustep  L6 UE/LE 10 minutes       Knee/Hip Exercises: Seated   Sit to Sand  10 reps;without UE support      Knee/Hip Exercises: Supine   Quad Sets  Right;15 reps    Straight Leg Raises  Right;20 reps      Manual Therapy   Manual Therapy  Soft tissue mobilization    Manual therapy comments  demo use of tennis ball     Soft tissue mobilization  ITB, post lateral hamstring              PT Education - 05/23/19 1049    Education Details  ITB stretch    Person(s) Educated  Patient    Methods  Explanation;Handout    Comprehension  Verbalized understanding       PT Short Term Goals - 05/23/19 1032      PT SHORT TERM GOAL #1   Title  He will be indpendent with initial hEp    Status  Achieved      PT SHORT TERM GOAL #2   Title  He will be walking out of home with least restrictive device    Status  Achieved      PT SHORT TERM GOAL #3   Title  He will walk full time in home with [facility]    Status  Achieved      PT SHORT TERM GOAL #4   Title  Active RT knee flexion to 100 degrees or better    Status  Achieved        PT Long Term Goals - 05/23/19 1019      PT LONG TERM GOAL #1   Title  He will be indpendent with all HEP issued    Time  12  Period  Weeks    Status  On-going      PT LONG TERM GOAL #2   Title  He will walk in home no device and out of home least restricted device    Baseline  he is beginning walk  without the cane now    Time  12    Period  Weeks    Status  On-going      PT LONG TERM GOAL #3   Title  He will be independent with all normal home tasks    Time  12    Period  Weeks    Status  Achieved      PT LONG TERM GOAL #4   Title  He will return to walking dog 1/4 mile for exercise    Time  12    Period  Weeks    Status  Partially Met      PT LONG TERM GOAL #5   Title  He will improve flexion to 120 degrees active and extension to -5-10 degrees to improve gait pattern and  tolerance    Status  Achieved      PT LONG TERM GOAL #6   Title  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  He forgot the cane over the weekend but was able to walk the dog without it.  He was sore in ITB added in a lateral hip stretch to target this at home.  He would like to be able to cont PT to address gait and ability to confidently and safely walk in the community. AROM 0-6-125 today in L knee.    PT Treatment/Interventions  Taping;Manual techniques;Passive range of motion;Patient/family education;Therapeutic exercise;Therapeutic activities;Stair training;Gait training;Vasopneumatic Device    PT Home Exercise Plan  All HEP reviewed and pt independent (HHPT HEP quad set, SAQ, LAQ, assisted knee flexion in sitting, heel slides SLR, side hip abduction), ITB  Tennis ball STW    Consulted and Agree with Plan of Care  Patient       Patient will benefit from skilled therapeutic intervention in order to improve the following deficits and impairments:  Pain, Difficulty walking, Decreased range of motion, Decreased balance, Decreased strength, Increased edema  Visit Diagnosis: Difficulty in walking, not elsewhere classified  Stiffness of right knee, not elsewhere classified  Acute pain of right knee  Localized edema     Problem List Patient  Active Problem List   Diagnosis Date Noted  . Primary osteoarthritis of right knee 04/05/2019  . CKD (chronic kidney disease) stage 3, GFR 30-59 ml/min 03/14/2019  . Degenerative joint disease of knee, right 09/27/2018  . Dyspnea 09/06/2018  . Right knee pain 09/06/2018  . Radiculitis of left cervical region 03/05/2018  . Plantar fasciitis, left 08/19/2017  . Chest pain 02/27/2017  . Right ear pain 02/27/2017  . Left groin pain 09/13/2016  . Rib pain on left side 09/13/2016  . Left leg pain 09/13/2016  . Stenosis of cervical spine with myelopathy ([place]) 06/20/2016  . Special screening for malignant neoplasms, colon 04/04/2016  . Cough 01/11/2016  . COPD exacerbation ([place]) 12/22/2015  . Rectal bleeding 08/14/2014  . Intercostal muscle strain 08/07/2014  . Chronic meniscal tear of knee 04/06/2014  .  Heel bone fracture 04/06/2014  . Hyperglycemia 04/04/2014  . Recurrent falls 04/04/2014  . Bilateral knee pain 04/04/2014  . Intractable left heel pain 04/04/2014  . Insomnia 04/04/2014  . Hematochezia 04/04/2014  . Bladder neck obstruction 12/19/2013  . Encounter for therapeutic drug monitoring 03/30/2013  . Palpitations 07/06/2012  . Shortness of breath 01/19/2012  . Sick sinus syndrome ([place]) 12/01/2011  . S/P CABG x 1 11/26/2011  . S/P Maze operation for atrial fibrillation 11/26/2011  . CAD (coronary artery disease) 11/25/2011  . Paroxysmal atrial fibrillation ([place]) 11/24/2011  . Chronic combined systolic (congestive) and diastolic (congestive) heart failure ([place]) 11/24/2011  . Gross hematuria 12/12/2010  . Left knee pain 12/12/2010  . Left knee DJD 07/09/2010  . Preventative health care 07/07/2010  . Chronic anticoagulation 04/08/2010  . COPD (chronic obstructive pulmonary disease) ([place]) 10/04/2008  . OBSTRUCTIVE SLEEP APNEA 01/06/2008  . ALLERGIC RHINITIS 01/14/2007  . Hypothyroidism 09/28/2006  . Hyperlipidemia 09/28/2006  . Anxiety state 09/28/2006  . Essential  hypertension 09/28/2006    PAA,JENNIFER 05/23/2019, 11:03 AM  [facility] [address] [place], [place], [address] Phone: [phone]   Fax:  [phone]  Name: Scott [name]. MRN: [id number] Date of Birth: 1956-01-10  Raeford Razor, PT 05/23/19 11:03 AM Phone: [phone] Fax: [phone]

## 2019-05-26 ENCOUNTER — Encounter: Payer: Self-pay | Admitting: Physical Therapy

## 2019-05-26 ENCOUNTER — Ambulatory Visit: Payer: PPO | Attending: Internal Medicine | Admitting: Physical Therapy

## 2019-05-26 ENCOUNTER — Other Ambulatory Visit: Payer: Self-pay

## 2019-05-26 ENCOUNTER — Ambulatory Visit: Payer: PPO | Attending: Internal Medicine

## 2019-05-26 DIAGNOSIS — R262 Difficulty in walking, not elsewhere classified: Secondary | ICD-10-CM | POA: Diagnosis not present

## 2019-05-26 DIAGNOSIS — Z96651 Presence of right artificial knee joint: Secondary | ICD-10-CM | POA: Insufficient documentation

## 2019-05-26 DIAGNOSIS — M25661 Stiffness of right knee, not elsewhere classified: Secondary | ICD-10-CM | POA: Insufficient documentation

## 2019-05-26 DIAGNOSIS — R600 Localized edema: Secondary | ICD-10-CM | POA: Insufficient documentation

## 2019-05-26 DIAGNOSIS — M25561 Pain in right knee: Secondary | ICD-10-CM | POA: Insufficient documentation

## 2019-05-26 DIAGNOSIS — Z23 Encounter for immunization: Secondary | ICD-10-CM

## 2019-05-26 NOTE — Progress Notes (Signed)
   [id number]  Name:  Scott [name].    MRN: [id number] DOB: [birth year]  05/26/2019  Scott [name] was observed post Covid-19 immunization for 15 minutes without incident. He was provided with Vaccine Information Sheet and instruction to access the V-Safe system.   Scott [name] was instructed to call 911 with any severe reactions post vaccine: Marland Kitchen Difficulty breathing  . Swelling of face and throat  . A fast heartbeat  . A bad rash all over body  . Dizziness and weakness   Immunizations Administered    Name Date Dose VIS Date Route   Pfizer COVID-19 Vaccine 05/26/2019  8:21 AM 0.3 mL 02/04/2019 Intramuscular   Manufacturer: [place]   Lot: [id number]   [place]: [id number]

## 2019-05-26 NOTE — Therapy (Signed)
[place], [place], [address] Phone: [phone]   Fax:  [phone]  Physical Therapy Treatment  Patient Details  Name: Scott [name]. MRN: [id number] Date of Birth: 31-Aug-1955 Referring Provider (PT): Genevive Bi   Encounter Date: 05/26/2019  PT End of Session - 05/26/19 1009    Visit Number  12    Number of Visits  24    Date for PT Re-Evaluation  07/08/19    Authorization Type  Health team advantage    Authorization Time Period  progress visit 10       KX visit 15    PT Start Time  1005    PT Stop Time  1050    PT Time Calculation (min)  45 min    Activity Tolerance  Patient tolerated treatment well    Behavior During Therapy  WFL for tasks assessed/performed       Past Medical History:  Diagnosis Date  . ALLERGIC RHINITIS 01/14/2007  . ANXIETY 09/28/2006  . Asbestos exposure   . Atrial fibrillation ([place]) 09/28/2006   s/p afib ablation x 2  . CHF (congestive heart failure) ([place])   . CKD (chronic kidney disease), stage III    patient denies  . COPD (chronic obstructive pulmonary disease) ([place])   . Coronary artery disease 11/24/2011  . Diastolic dysfunction 02/29/7260  . Dyspnea    W/ EXERTION   . Dysrhythmia    AFIB     . First degree AV block 10/12/2018   Noted on EKG  . Gallstones   . Hyperglycemia   . HYPERLIPIDEMIA 09/28/2006  . HYPERTENSION 09/28/2006  . HYPOTHYROIDISM, ACQUIRED NEC 09/28/2006   pt denies and doesn't know  . Long term current use of anticoagulant 04/08/2010  . Obese   . OBSTRUCTIVE SLEEP APNEA 01/06/2008   NOT USING    . Right knee DJD 07/09/2010  . S/P CABG x 1 11/26/2011   Right internal mammary artery to right coronary artery  . S/P Maze operation for atrial fibrillation 11/26/2011   complete biatrial lesion set with clipping of LA appendage    Past Surgical History:  Procedure Laterality Date  . ANTERIOR CERVICAL DECOMP/DISCECTOMY FUSION N/A 06/20/2016   Procedure: Anterior Cervical Discectomy Fusion - Cervical five-Cervical six - Cervical six-Cervical seven - Cervicla seven- Thoracic one;  Surgeon: Earnie Larsson, MD;  Location: [place];  Service: Neurosurgery;  Laterality: N/A;  . CARDIOVERSION     X4   . CORONARY ARTERY BYPASS GRAFT  11/26/2011   Procedure: CORONARY ARTERY BYPASS GRAFTING (CABG);  Surgeon: Rexene Alberts, MD;  Location: [place];  Service: Open Heart Surgery;  Laterality: N/A;  coronary artery bypass on pump times one utilizing the right internal mammary artery, transesophageal echocardiogram   . HAND SURGERY Left   . LEFT HEART CATHETERIZATION WITH CORONARY ANGIOGRAM N/A 11/24/2011   Procedure: LEFT HEART CATHETERIZATION WITH CORONARY ANGIOGRAM;  Surgeon: Peter M Martinique, MD;  Location: [facility] CATH LAB;  Service: Cardiovascular;  Laterality: N/A;  . LOOP RECORDER IMPLANT N/A 07/06/2012   Procedure: LOOP RECORDER IMPLANT;  Surgeon: Thompson Grayer, MD;  Location: [facility] CATH LAB;  Service: Cardiovascular;  Laterality: N/A;  . MAZE  11/26/2011   Procedure: MAZE;  Surgeon: Rexene Alberts, MD;  Location: [place];  Service: Open Heart Surgery;  Laterality: N/A;  Cryomaze   . Radiofrequency Ablation for atrial fibrillation  12/30/07 and 06/05/09   afib ablation x 2 by JA  . Radiofrequency  ablation for atrial flutter  2005   CTI  . TOTAL KNEE ARTHROPLASTY Right 04/05/2019   Procedure: RIGHT TOTAL KNEE ARTHROPLASTY;  Surgeon: Melrose Nakayama, MD;  [facility];  Service: Orthopedics;  Laterality: Right;    There were no vitals filed for this visit.  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  Pain in Rt lateral knee , theres a big knot in it.    Currently in Pain?  Yes    Pain Score  7        [facility] Adult PT Treatment/Exercise - 05/26/19 0001      Knee/Hip Exercises: Stretches   Active Hamstring Stretch  Right;3 reps;30 seconds    ITB Stretch  Right;3 reps;30 seconds      Knee/Hip Exercises:  Aerobic   Nustep  L6 UE/LE 8  minutes       Knee/Hip Exercises: Machines for Strengthening   Cybex Leg Press  2 x 15 reps, parallel and wide , mild stretch Rt post knee     Total Gym Leg Press  --      Knee/Hip Exercises: Standing   Hip Flexion  --    Hip Abduction  1 set;10 reps    Lateral Step Up  1 set;20 reps;Hand Hold: 1;Step Height: 6"    Functional Squat Limitations  x 10 with UE suport more for ROM     Other Standing Knee Exercises  sidestepping for balance 4 x 15       Knee/Hip Exercises: Supine   Bridges  Strengthening;Both;1 set;15 reps    Straight Leg Raises Limitations  x 15     Straight Leg Raise with External Rotation Limitations  x 15       Vasopneumatic   Number Minutes Vasopneumatic   --    Vasopnuematic Location   --    Vasopneumatic Pressure  --    Vasopneumatic Temperature   --      Manual Therapy   Joint Mobilization  patellar     Soft tissue mobilization  ITB, vastus lateralis, post lateral hamstring    IASTM to lateral knee, ITB             PT Education - 05/26/19 1157    Education Details  "knot" in lateral knee, watch swelling, pillow posiitoning on his LT side (between knees)    Person(s) Educated  Patient    Methods  Explanation;Demonstration;Verbal cues;Handout    Comprehension  Verbalized understanding;Returned demonstration       PT Short Term Goals - 05/23/19 1032      PT SHORT TERM GOAL #1   Title  He will be indpendent with initial hEp    Status  Achieved      PT SHORT TERM GOAL #2   Title  He will be walking out of home with least restrictive device    Status  Achieved      PT SHORT TERM GOAL #3   Title  He will walk full time in home with [facility]    Status  Achieved      PT SHORT TERM GOAL #4   Title  Active RT knee flexion to 100 degrees or better    Status  Achieved        PT Long Term Goals - 05/23/19 1019      PT LONG TERM GOAL #1   Title  He will be indpendent with all HEP issued  Time  12    Period  Weeks     Status  On-going      PT LONG TERM GOAL #2   Title  He will walk in home no device and out of home least restricted device    Baseline  he is beginning walk without the cane now    Time  12    Period  Weeks    Status  On-going      PT LONG TERM GOAL #3   Title  He will be independent with all normal home tasks    Time  12    Period  Weeks    Status  Achieved      PT LONG TERM GOAL #4   Title  He will return to walking dog 1/4 mile for exercise    Time  12    Period  Weeks    Status  Partially Met      PT LONG TERM GOAL #5   Title  He will improve flexion to 120 degrees active and extension to -5-10 degrees to improve gait pattern and  tolerance    Status  Achieved      PT LONG TERM GOAL #6   Title  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  Worked on loosening surrounding tissue and declined ice here.  He was asked to watch for fluctuations in size but is likely a normal finding given diagnosis.  Cont with Rt post knee tightness.  gait pattern is more stable today, able to recover from light postural sway with sidestepping.    PT Treatment/Interventions  Taping;Manual techniques;Passive range of motion;Patient/family education;Therapeutic exercise;Therapeutic activities;Stair training;Gait training;Vasopneumatic Device    PT Next Visit Plan  contniue strength , vaso, manual, STW post knee    PT Home Exercise Plan  All HEP reviewed and pt independent (HHPT HEP quad set, SAQ, LAQ, assisted knee flexion in sitting, heel slides SLR, side hip abduction), ITB  Tennis ball STW    Consulted and Agree with Plan of Care  Patient       Patient will benefit from skilled therapeutic intervention in order to improve the following deficits and impairments:  Pain, Difficulty walking,  Decreased range of motion, Decreased balance, Decreased strength, Increased edema  Visit Diagnosis: Difficulty in walking, not elsewhere classified  Stiffness of right knee, not elsewhere classified  Acute pain of right knee  Localized edema     Problem List Patient Active Problem List   Diagnosis Date Noted  . Primary osteoarthritis of right knee 04/05/2019  . CKD (chronic kidney disease) stage 3, GFR 30-59 ml/min 03/14/2019  . Degenerative joint disease of knee, right 09/27/2018  . Dyspnea 09/06/2018  . Right knee pain 09/06/2018  . Radiculitis of left cervical region 03/05/2018  . Plantar fasciitis, left 08/19/2017  . Chest pain 02/27/2017  . Right ear pain 02/27/2017  . Left groin pain 09/13/2016  . Rib pain on left side 09/13/2016  . Left leg pain 09/13/2016  . Stenosis of cervical spine with myelopathy ([place]) 06/20/2016  . Special screening for malignant neoplasms, colon 04/04/2016  . Cough 01/11/2016  . COPD exacerbation ([place]) 12/22/2015  .  Rectal bleeding 08/14/2014  . Intercostal muscle strain 08/07/2014  . Chronic meniscal tear of knee 04/06/2014  . Heel bone fracture 04/06/2014  . Hyperglycemia 04/04/2014  . Recurrent falls 04/04/2014  . Bilateral knee pain 04/04/2014  . Intractable left heel pain 04/04/2014  . Insomnia 04/04/2014  . Hematochezia 04/04/2014  . Bladder neck obstruction 12/19/2013  . Encounter for therapeutic drug monitoring 03/30/2013  . Palpitations 07/06/2012  . Shortness of breath 01/19/2012  . Sick sinus syndrome ([place]) 12/01/2011  . S/P CABG x 1 11/26/2011  . S/P Maze operation for atrial fibrillation 11/26/2011  . CAD (coronary artery disease) 11/25/2011  . Paroxysmal atrial fibrillation ([place]) 11/24/2011  . Chronic combined systolic (congestive) and diastolic (congestive) heart failure ([place]) 11/24/2011  . Gross hematuria 12/12/2010  . Left knee pain 12/12/2010  . Left knee DJD 07/09/2010  . Preventative health care 07/07/2010  .  Chronic anticoagulation 04/08/2010  . COPD (chronic obstructive pulmonary disease) ([place]) 10/04/2008  . OBSTRUCTIVE SLEEP APNEA 01/06/2008  . ALLERGIC RHINITIS 01/14/2007  . Hypothyroidism 09/28/2006  . Hyperlipidemia 09/28/2006  . Anxiety state 09/28/2006  . Essential hypertension 09/28/2006    Kayly Kriegel 05/26/2019, 12:02 PM  [facility] [address] [place], [place], [address] Phone: [phone]   Fax:  [phone]  Name: Scott [name]. MRN: [id number] Date of Birth: 1956/02/15  Raeford Razor, PT 05/26/19 12:02 PM Phone: [phone] Fax: [phone]

## 2019-05-26 NOTE — Patient Instructions (Signed)
HIP: Flexion / KNEE: Extension, Straight Leg Raise    Raise leg, keeping knee straight. Perform slowly. __10_ reps per set, _2-3__ sets per day, __5_ days per week   Copyright  VHI. All rights reserved.

## 2019-05-27 DIAGNOSIS — G4733 Obstructive sleep apnea (adult) (pediatric): Secondary | ICD-10-CM | POA: Diagnosis not present

## 2019-06-02 ENCOUNTER — Ambulatory Visit: Payer: PPO | Admitting: Physical Therapy

## 2019-06-02 ENCOUNTER — Other Ambulatory Visit: Payer: Self-pay

## 2019-06-02 DIAGNOSIS — R262 Difficulty in walking, not elsewhere classified: Secondary | ICD-10-CM

## 2019-06-02 DIAGNOSIS — M25661 Stiffness of right knee, not elsewhere classified: Secondary | ICD-10-CM

## 2019-06-02 DIAGNOSIS — M25561 Pain in right knee: Secondary | ICD-10-CM

## 2019-06-02 DIAGNOSIS — R600 Localized edema: Secondary | ICD-10-CM

## 2019-06-02 NOTE — Therapy (Signed)
[place], [place], [address] Phone: [phone]   Fax:  [phone]  Physical Therapy Treatment  Patient Details  Name: Scott [name]. MRN: [id number] Date of Birth: 09-04-1955 Referring Provider (PT): Genevive Bi   Encounter Date: 06/02/2019  PT End of Session - 06/02/19 1416    Visit Number  13    Number of Visits  24    Date for PT Re-Evaluation  07/08/19    Authorization Type  Health team advantage    Authorization Time Period  progress visit 10       KX visit 15    PT Start Time  1403    PT Stop Time  1505    PT Time Calculation (min)  62 min    Activity Tolerance  Patient tolerated treatment well    Behavior During Therapy  WFL for tasks assessed/performed       Past Medical History:  Diagnosis Date  . ALLERGIC RHINITIS 01/14/2007  . ANXIETY 09/28/2006  . Asbestos exposure   . Atrial fibrillation ([place]) 09/28/2006   s/p afib ablation x 2  . CHF (congestive heart failure) ([place])   . CKD (chronic kidney disease), stage III    patient denies  . COPD (chronic obstructive pulmonary disease) ([place])   . Coronary artery disease 11/24/2011  . Diastolic dysfunction 04/29/7015  . Dyspnea    W/ EXERTION   . Dysrhythmia    AFIB     . First degree AV block 10/12/2018   Noted on EKG  . Gallstones   . Hyperglycemia   . HYPERLIPIDEMIA 09/28/2006  . HYPERTENSION 09/28/2006  . HYPOTHYROIDISM, ACQUIRED NEC 09/28/2006   pt denies and doesn't know  . Long term current use of anticoagulant 04/08/2010  . Obese   . OBSTRUCTIVE SLEEP APNEA 01/06/2008   NOT USING    . Right knee DJD 07/09/2010  . S/P CABG x 1 11/26/2011   Right internal mammary artery to right coronary artery  . S/P Maze operation for atrial fibrillation 11/26/2011   complete biatrial lesion set with clipping of LA appendage    Past Surgical History:  Procedure Laterality Date  . ANTERIOR CERVICAL DECOMP/DISCECTOMY FUSION N/A 06/20/2016    Procedure: Anterior Cervical Discectomy Fusion - Cervical five-Cervical six - Cervical six-Cervical seven - Cervicla seven- Thoracic one;  Surgeon: Earnie Larsson, MD;  Location: [place];  Service: Neurosurgery;  Laterality: N/A;  . CARDIOVERSION     X4   . CORONARY ARTERY BYPASS GRAFT  11/26/2011   Procedure: CORONARY ARTERY BYPASS GRAFTING (CABG);  Surgeon: Rexene Alberts, MD;  Location: [place];  Service: Open Heart Surgery;  Laterality: N/A;  coronary artery bypass on pump times one utilizing the right internal mammary artery, transesophageal echocardiogram   . HAND SURGERY Left   . LEFT HEART CATHETERIZATION WITH CORONARY ANGIOGRAM N/A 11/24/2011   Procedure: LEFT HEART CATHETERIZATION WITH CORONARY ANGIOGRAM;  Surgeon: Peter M Martinique, MD;  Location: [facility] CATH LAB;  Service: Cardiovascular;  Laterality: N/A;  . LOOP RECORDER IMPLANT N/A 07/06/2012   Procedure: LOOP RECORDER IMPLANT;  Surgeon: Thompson Grayer, MD;  Location: [facility] CATH LAB;  Service: Cardiovascular;  Laterality: N/A;  . MAZE  11/26/2011   Procedure: MAZE;  Surgeon: Rexene Alberts, MD;  Location: [place];  Service: Open Heart Surgery;  Laterality: N/A;  Cryomaze   . Radiofrequency Ablation for atrial fibrillation  12/30/07 and 06/05/09   afib ablation x 2 by JA  .  Radiofrequency ablation for atrial flutter  2005   CTI  . TOTAL KNEE ARTHROPLASTY Right 04/05/2019   Procedure: RIGHT TOTAL KNEE ARTHROPLASTY;  Surgeon: Melrose Nakayama, MD;  Location: [facility];  Service: Orthopedics;  Laterality: Right;    There were no vitals filed for this visit.  Subjective Assessment - 06/02/19 1401    Subjective  When is this swelling going to go away? Worked and did OK, its just getting in and out of the car alot, walking. I iced it and walked the dog after.    Currently in Pain?  Yes    Pain Score  1     Pain Location  Knee    Pain Orientation  Left    Pain Descriptors / Indicators  Discomfort    Pain Type  Surgical pain    Pain Onset  More than a month ago     Pain Frequency  Intermittent         [facility] PT Assessment - 06/02/19 0001      Circumferential Edema   Circumferential - Right  16. 5 inch     Circumferential - Left   15.25      High Level Balance   High Level Balance Activites  Side stepping;Backward walking;Head turns;Other (comment)    High Level Balance Comments  head turns vertical, horizontal, used parallel bars for high knees and retrogait , cues to look forward , very minimal cervical ROM and with mild LOB , sidestep with mini squat        [facility] Adult PT Treatment/Exercise - 06/02/19 0001      Knee/Hip Exercises: Stretches   Active Hamstring Stretch Limitations  active with blue bandx 15     Knee: Self-Stretch Limitations  knee to chest x 1 min       Knee/Hip Exercises: Aerobic   Nustep  L6 UE/LE 6 minutes       Knee/Hip Exercises: Standing   Forward Step Up  Right;1 set;20 reps;Hand Hold: 1;Step Height: 6"    Other Standing Knee Exercises  step downs 4 inch x 20 1 UE assist       Vasopneumatic   Number Minutes Vasopneumatic   15 minutes    Vasopnuematic Location   Knee    Vasopneumatic Pressure  Low    Vasopneumatic Temperature   32      Manual Therapy   Joint Mobilization  Gr I-II extension mobs with towel , good motion     Soft tissue mobilization  Rt quad, ITB, post hamstring                PT Short Term Goals - 05/23/19 1032      PT SHORT TERM GOAL #1   Title  He will be indpendent with initial hEp    Status  Achieved      PT SHORT TERM GOAL #2   Title  He will be walking out of home with least restrictive device    Status  Achieved      PT SHORT TERM GOAL #3   Title  He will walk full time in home with [facility]    Status  Achieved      PT SHORT TERM GOAL #4   Title  Active RT knee flexion to 100 degrees or better    Status  Achieved        PT Long Term Goals - 05/23/19 1019      PT LONG TERM GOAL #1   Title  He will be indpendent with all HEP issued    Time  12    Period  Weeks     Status  On-going      PT LONG TERM GOAL #2   Title  He will walk in home no device and out of home least restricted device    Baseline  he is beginning walk without the cane now    Time  12    Period  Weeks    Status  On-going      PT LONG TERM GOAL #3   Title  He will be independent with all normal home tasks    Time  12    Period  Weeks    Status  Achieved      PT LONG TERM GOAL #4   Title  He will return to walking dog 1/4 mile for exercise    Time  12    Period  Weeks    Status  Partially Met      PT LONG TERM GOAL #5   Title  He will improve flexion to 120 degrees active and extension to -5-10 degrees to improve gait pattern and  tolerance    Status  Achieved      PT LONG TERM GOAL #6   Title  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  Worked on challenging balance today, limited ability to sidestep without looking down at his feet.  He states this is functional for him and he has always done this.  Reported "fire" under his kneecap after session.  Used vaso to address swelling and advised him to elevate legs and use hands to flush fluid up into lymph nodes. No pain post session.    PT Treatment/Interventions  Taping;Manual techniques;Passive range of motion;Patient/family education;Therapeutic exercise;Therapeutic activities;Stair training;Gait training;Vasopneumatic Device;Cryotherapy    PT Next Visit Plan  contniue balane/gait, strength , vaso, manual, STW post knee    PT Home Exercise Plan  All HEP reviewed and pt independent (HHPT HEP quad set, SAQ, LAQ, assisted knee flexion in sitting, heel slides SLR, side hip abduction), ITB  Tennis ball STW    Consulted and Agree with Plan of Care  Patient       Patient will benefit from skilled therapeutic  intervention in order to improve the following deficits and impairments:  Pain, Difficulty walking, Decreased range of motion, Decreased balance, Decreased strength, Increased edema  Visit Diagnosis: Stiffness of right knee, not elsewhere classified  Acute pain of right knee  Localized edema  Difficulty in walking, not elsewhere classified     Problem List Patient Active Problem List   Diagnosis Date Noted  . Primary osteoarthritis of right knee 04/05/2019  . CKD (chronic kidney disease) stage 3, GFR 30-59 ml/min 03/14/2019  . Degenerative joint disease of knee, right 09/27/2018  . Dyspnea 09/06/2018  . Right knee pain 09/06/2018  . Radiculitis of left cervical region 03/05/2018  . Plantar fasciitis, left 08/19/2017  . Chest pain 02/27/2017  . Right ear pain 02/27/2017  . Left groin pain 09/13/2016  . Rib pain on left side 09/13/2016  . Left leg pain 09/13/2016  . Stenosis  of cervical spine with myelopathy ([place]) 06/20/2016  . Special screening for malignant neoplasms, colon 04/04/2016  . Cough 01/11/2016  . COPD exacerbation ([place]) 12/22/2015  . Rectal bleeding 08/14/2014  . Intercostal muscle strain 08/07/2014  . Chronic meniscal tear of knee 04/06/2014  . Heel bone fracture 04/06/2014  . Hyperglycemia 04/04/2014  . Recurrent falls 04/04/2014  . Bilateral knee pain 04/04/2014  . Intractable left heel pain 04/04/2014  . Insomnia 04/04/2014  . Hematochezia 04/04/2014  . Bladder neck obstruction 12/19/2013  . Encounter for therapeutic drug monitoring 03/30/2013  . Palpitations 07/06/2012  . Shortness of breath 01/19/2012  . Sick sinus syndrome ([place]) 12/01/2011  . S/P CABG x 1 11/26/2011  . S/P Maze operation for atrial fibrillation 11/26/2011  . CAD (coronary artery disease) 11/25/2011  . Paroxysmal atrial fibrillation ([place]) 11/24/2011  . Chronic combined systolic (congestive) and diastolic (congestive) heart failure ([place]) 11/24/2011  . Gross hematuria 12/12/2010  .  Left knee pain 12/12/2010  . Left knee DJD 07/09/2010  . Preventative health care 07/07/2010  . Chronic anticoagulation 04/08/2010  . COPD (chronic obstructive pulmonary disease) ([place]) 10/04/2008  . OBSTRUCTIVE SLEEP APNEA 01/06/2008  . ALLERGIC RHINITIS 01/14/2007  . Hypothyroidism 09/28/2006  . Hyperlipidemia 09/28/2006  . Anxiety state 09/28/2006  . Essential hypertension 09/28/2006    Lazaro Isenhower 06/02/2019, 3:08 PM  [facility] [address] [place], [place], [address] Phone: [phone]   Fax:  [phone]  Name: Deny Chevez. MRN: [id number] Date of Birth: [birth year]  Raeford Razor, PT 06/02/19 3:09 PM Phone: [phone] Fax: [phone]

## 2019-06-04 ENCOUNTER — Other Ambulatory Visit: Payer: Self-pay | Admitting: Internal Medicine

## 2019-06-05 NOTE — Telephone Encounter (Signed)
Done erx 

## 2019-06-06 ENCOUNTER — Other Ambulatory Visit: Payer: Self-pay

## 2019-06-06 ENCOUNTER — Ambulatory Visit: Payer: PPO

## 2019-06-06 DIAGNOSIS — M25561 Pain in right knee: Secondary | ICD-10-CM

## 2019-06-06 DIAGNOSIS — R262 Difficulty in walking, not elsewhere classified: Secondary | ICD-10-CM | POA: Diagnosis not present

## 2019-06-06 DIAGNOSIS — M25661 Stiffness of right knee, not elsewhere classified: Secondary | ICD-10-CM

## 2019-06-06 NOTE — Therapy (Signed)
[place], [place], [address] Phone: [phone]   Fax:  [phone]  Physical Therapy Treatment  Patient Details  Name: Scott [name]. MRN: [id number] Date of Birth: 1956-01-23 Referring Provider (PT): Genevive Bi   Encounter Date: 06/06/2019  PT End of Session - 06/06/19 1040    Visit Number  14    Number of Visits  24    Date for PT Re-Evaluation  07/08/19    Authorization Type  Health team advantage    Authorization Time Period  progress visit 10       KX visit 15    PT Start Time  1045    PT Stop Time  1127    PT Time Calculation (min)  42 min    Activity Tolerance  Patient tolerated treatment well    Behavior During Therapy  WFL for tasks assessed/performed       Past Medical History:  Diagnosis Date  . ALLERGIC RHINITIS 01/14/2007  . ANXIETY 09/28/2006  . Asbestos exposure   . Atrial fibrillation ([place]) 09/28/2006   s/p afib ablation x 2  . CHF (congestive heart failure) ([place])   . CKD (chronic kidney disease), stage III    patient denies  . COPD (chronic obstructive pulmonary disease) ([place])   . Coronary artery disease 11/24/2011  . Diastolic dysfunction 10/26/6943  . Dyspnea    W/ EXERTION   . Dysrhythmia    AFIB     . First degree AV block 10/12/2018   Noted on EKG  . Gallstones   . Hyperglycemia   . HYPERLIPIDEMIA 09/28/2006  . HYPERTENSION 09/28/2006  . HYPOTHYROIDISM, ACQUIRED NEC 09/28/2006   pt denies and doesn't know  . Long term current use of anticoagulant 04/08/2010  . Obese   . OBSTRUCTIVE SLEEP APNEA 01/06/2008   NOT USING    . Right knee DJD 07/09/2010  . S/P CABG x 1 11/26/2011   Right internal mammary artery to right coronary artery  . S/P Maze operation for atrial fibrillation 11/26/2011   complete biatrial lesion set with clipping of LA appendage    Past Surgical History:  Procedure Laterality Date  . ANTERIOR CERVICAL DECOMP/DISCECTOMY FUSION N/A 06/20/2016    Procedure: Anterior Cervical Discectomy Fusion - Cervical five-Cervical six - Cervical six-Cervical seven - Cervicla seven- Thoracic one;  Surgeon: Earnie Larsson, MD;  Location: [place];  Service: Neurosurgery;  Laterality: N/A;  . CARDIOVERSION     X4   . CORONARY ARTERY BYPASS GRAFT  11/26/2011   Procedure: CORONARY ARTERY BYPASS GRAFTING (CABG);  Surgeon: Rexene Alberts, MD;  Location: [place];  Service: Open Heart Surgery;  Laterality: N/A;  coronary artery bypass on pump times one utilizing the right internal mammary artery, transesophageal echocardiogram   . HAND SURGERY Left   . LEFT HEART CATHETERIZATION WITH CORONARY ANGIOGRAM N/A 11/24/2011   Procedure: LEFT HEART CATHETERIZATION WITH CORONARY ANGIOGRAM;  Surgeon: Peter M Martinique, MD;  Location[facility] CATH LAB;  Service: Cardiovascular;  Laterality: N/A;  . LOOP RECORDER IMPLANT N/A 07/06/2012   Procedure: LOOP RECORDER IMPLANT;  Surgeon: Thompson Grayer, MD;  Location: [facility] CATH LAB;  Service: Cardiovascular;  Laterality: N/A;  . MAZE  11/26/2011   Procedure: MAZE;  Surgeon: Rexene Alberts, MD;  Location: [place];  Service: Open Heart Surgery;  Laterality: N/A;  Cryomaze   . Radiofrequency Ablation for atrial fibrillation  12/30/07 and 06/05/09   afib ablation x 2 by JA  .  Radiofrequency ablation for atrial flutter  2005   CTI  . TOTAL KNEE ARTHROPLASTY Right 04/05/2019   Procedure: RIGHT TOTAL KNEE ARTHROPLASTY;  Surgeon: Melrose Nakayama, MD;  [facility];  Service: Orthopedics;  Laterality: Right;    There were no vitals filed for this visit.  Subjective Assessment - 06/06/19 1045    Subjective  He reports he feels like his patella is too mobile. not using assisted device now. Can walk a good distance (2 miles) but the next day increased pain and can't walk  for distance.    Currently in Pain?  No/denies         [facility] PT Assessment - 06/06/19 0001      AROM   Right Knee Extension  6    Right Knee Flexion  125                    [facility] Adult PT Treatment/Exercise - 06/06/19 0001      Knee/Hip Exercises: Stretches   Gastroc Stretch  Both;2 reps;20 seconds    Gastroc Stretch Limitations  off step      Knee/Hip Exercises: Aerobic   Nustep  L6 LE 6 minutes       Knee/Hip Exercises: Machines for Strengthening   Cybex Leg Press  2 x 15 reps, parallel and wide , mild stretch Rt post knee       Knee/Hip Exercises: Standing   Forward Step Up  Right;15 reps;Hand Hold: 2;Step Height: 8"    Functional Squat Limitations  x 10 with UE suport more for ROM     Other Standing Knee Exercises  step downs 4 inch x 15 2 UE assist     Other Standing Knee Exercises  side steps x 30 RT and LT yellow band.        Knee/Hip Exercises: Seated   Long Arc Quad  Right;20 reps    Long Arc Quad Weight  5 lbs.    Long [employer] Limitations  some lateral and medial pain but  tolerable               PT Short Term Goals - 05/23/19 1032      PT SHORT TERM GOAL #1   Title  He will be indpendent with initial hEp    Status  Achieved      PT SHORT TERM GOAL #2   Title  He will be walking out of home with least restrictive device    Status  Achieved      PT SHORT TERM GOAL #3   Title  He will walk full time in home with [facility]    Status  Achieved      PT SHORT TERM GOAL #4   Title  Active RT knee flexion to 100 degrees or better    Status  Achieved        PT Long Term Goals - 05/23/19 1019      PT LONG TERM GOAL #1   Title  He will be indpendent with all HEP issued    Time  12    Period  Weeks    Status  On-going      PT LONG TERM GOAL #2   Title  He will walk in home no device and out of home least restricted device    Baseline  he is beginning walk without the cane now    Time  12    Period  Weeks    Status  On-going      PT LONG TERM GOAL #3   Title  He will be independent with all normal home tasks    Time  12    Period  Weeks    Status  Achieved      PT LONG TERM GOAL #4   Title   He will return to walking dog 1/4 mile for exercise    Time  12    Period  Weeks    Status  Partially Met      PT LONG TERM GOAL #5   Title  He will improve flexion to 120 degrees active and extension to -5-10 degrees to improve gait pattern and  tolerance    Status  Achieved      PT LONG TERM GOAL #6   Title  He will return to work at [employer]    Time  12    Period  Weeks    Status  On-going            Plan - 06/06/19 1041    Clinical Impression Statement  He is concerned about pain and swelling. Patella appears mobile due to swelling. He is improved with decreased need for device and  no instability. He will follow up with MD this week. Declined Vaso this session    PT Treatment/Interventions  Taping;Manual techniques;Passive range of motion;Patient/family education;Therapeutic exercise;Therapeutic activities;Stair training;Gait training;Vasopneumatic Device;Cryotherapy    PT Next Visit Plan  contniue balane/gait, strength , vaso, manual, STW post knee    PT Home Exercise Plan  All HEP reviewed and pt independent (HHPT HEP quad set, SAQ, LAQ, assisted knee flexion in sitting, heel slides SLR, side hip abduction), ITB  Tennis ball STW    Consulted and Agree with Plan of Care  Patient       Patient will benefit from skilled therapeutic intervention in order to improve the following deficits and impairments:  Pain, Difficulty walking, Decreased range of motion, Decreased balance, Decreased strength, Increased edema  Visit Diagnosis: Stiffness of right knee, not elsewhere classified  Acute pain of right knee  Difficulty in walking, not elsewhere classified     Problem List Patient Active Problem List   Diagnosis Date Noted  . Primary osteoarthritis of right knee 04/05/2019  . CKD (chronic kidney disease) stage 3, GFR 30-59 ml/min 03/14/2019  . Degenerative joint disease of knee, right 09/27/2018  . Dyspnea 09/06/2018  . Right knee pain 09/06/2018  . Radiculitis of  left cervical region 03/05/2018  . Plantar fasciitis, left 08/19/2017  . Chest pain 02/27/2017  . Right ear pain 02/27/2017  . Left groin pain 09/13/2016  . Rib pain on left side 09/13/2016  . Left leg pain 09/13/2016  . Stenosis of cervical spine with myelopathy ([place]) 06/20/2016  . Special screening for malignant neoplasms, colon 04/04/2016  . Cough 01/11/2016  . COPD exacerbation ([place]) 12/22/2015  . Rectal bleeding 08/14/2014  . Intercostal muscle strain 08/07/2014  . Chronic meniscal tear of knee 04/06/2014  . Heel bone fracture 04/06/2014  . Hyperglycemia 04/04/2014  . Recurrent falls 04/04/2014  . Bilateral knee pain 04/04/2014  . Intractable left heel pain 04/04/2014  . Insomnia 04/04/2014  . Hematochezia 04/04/2014  . Bladder neck obstruction 12/19/2013  . Encounter for therapeutic drug monitoring 03/30/2013  . Palpitations 07/06/2012  . Shortness of breath 01/19/2012  . Sick sinus syndrome ([place]) 12/01/2011  . S/P CABG x 1 11/26/2011  . S/P Maze operation for atrial fibrillation 11/26/2011  . CAD (  coronary artery disease) 11/25/2011  . Paroxysmal atrial fibrillation ([place]) 11/24/2011  . Chronic combined systolic (congestive) and diastolic (congestive) heart failure ([place]) 11/24/2011  . Gross hematuria 12/12/2010  . Left knee pain 12/12/2010  . Left knee DJD 07/09/2010  . Preventative health care 07/07/2010  . Chronic anticoagulation 04/08/2010  . COPD (chronic obstructive pulmonary disease) ([place]) 10/04/2008  . OBSTRUCTIVE SLEEP APNEA 01/06/2008  . ALLERGIC RHINITIS 01/14/2007  . Hypothyroidism 09/28/2006  . Hyperlipidemia 09/28/2006  . Anxiety state 09/28/2006  . Essential hypertension 09/28/2006    Darrel Hoover PT 06/06/2019, 11:30 AM  [facility] [address] [place], [place], [address] Phone: [phone]   Fax:  [phone]  Name: Scott [name]. MRN: [id number] Date of Birth: 1955-11-07

## 2019-06-09 ENCOUNTER — Other Ambulatory Visit: Payer: Self-pay

## 2019-06-09 ENCOUNTER — Ambulatory Visit: Payer: PPO

## 2019-06-09 DIAGNOSIS — R600 Localized edema: Secondary | ICD-10-CM

## 2019-06-09 DIAGNOSIS — M25661 Stiffness of right knee, not elsewhere classified: Secondary | ICD-10-CM

## 2019-06-09 DIAGNOSIS — R262 Difficulty in walking, not elsewhere classified: Secondary | ICD-10-CM | POA: Diagnosis not present

## 2019-06-09 DIAGNOSIS — Z96651 Presence of right artificial knee joint: Secondary | ICD-10-CM

## 2019-06-09 NOTE — Therapy (Signed)
[place], [place], [address] Phone: [phone]   Fax:  [phone]  Physical Therapy Treatment  Patient Details  Name: Scott [name]. MRN: [id number] Date of Birth: [birth year] Referring Provider (PT): Genevive Bi   Encounter Date: 06/09/2019  PT End of Session - 06/09/19 1058    Visit Number  15    Number of Visits  24    Date for PT Re-Evaluation  07/08/19    Authorization Type  Health team advantage    Authorization Time Period  progress visit 10       KX visit 15    PT Start Time  1045    PT Stop Time  1130    PT Time Calculation (min)  45 min    Activity Tolerance  Patient tolerated treatment well    Behavior During Therapy  WFL for tasks assessed/performed       Past Medical History:  Diagnosis Date  . ALLERGIC RHINITIS 01/14/2007  . ANXIETY 09/28/2006  . Asbestos exposure   . Atrial fibrillation ([place]) 09/28/2006   s/p afib ablation x 2  . CHF (congestive heart failure) ([place])   . CKD (chronic kidney disease), stage III    patient denies  . COPD (chronic obstructive pulmonary disease) ([place])   . Coronary artery disease 11/24/2011  . Diastolic dysfunction 4/0/3474  . Dyspnea    W/ EXERTION   . Dysrhythmia    AFIB     . First degree AV block 10/12/2018   Noted on EKG  . Gallstones   . Hyperglycemia   . HYPERLIPIDEMIA 09/28/2006  . HYPERTENSION 09/28/2006  . HYPOTHYROIDISM, ACQUIRED NEC 09/28/2006   pt denies and doesn't know  . Long term current use of anticoagulant 04/08/2010  . Obese   . OBSTRUCTIVE SLEEP APNEA 01/06/2008   NOT USING    . Right knee DJD 07/09/2010  . S/P CABG x 1 11/26/2011   Right internal mammary artery to right coronary artery  . S/P Maze operation for atrial fibrillation 11/26/2011   complete biatrial lesion set with clipping of LA appendage    Past Surgical History:  Procedure Laterality Date  . ANTERIOR CERVICAL DECOMP/DISCECTOMY FUSION N/A 06/20/2016   Procedure: Anterior Cervical Discectomy Fusion - Cervical five-Cervical six - Cervical six-Cervical seven - Cervicla seven- Thoracic one;  Surgeon: Earnie Larsson, MD;  Location: [place];  Service: Neurosurgery;  Laterality: N/A;  . CARDIOVERSION     X4   . CORONARY ARTERY BYPASS GRAFT  11/26/2011   Procedure: CORONARY ARTERY BYPASS GRAFTING (CABG);  Surgeon: Rexene Alberts, MD;  Location: [place];  Service: Open Heart Surgery;  Laterality: N/A;  coronary artery bypass on pump times one utilizing the right internal mammary artery, transesophageal echocardiogram   . HAND SURGERY Left   . LEFT HEART CATHETERIZATION WITH CORONARY ANGIOGRAM N/A 11/24/2011   Procedure: LEFT HEART CATHETERIZATION WITH CORONARY ANGIOGRAM;  Surgeon: Peter M Martinique, MD;  Location: [facility] CATH LAB;  Service: Cardiovascular;  Laterality: N/A;  . LOOP RECORDER IMPLANT N/A 07/06/2012   Procedure: LOOP RECORDER IMPLANT;  Surgeon: Thompson Grayer, MD;  Location: [facility] CATH LAB;  Service: Cardiovascular;  Laterality: N/A;  . MAZE  11/26/2011   Procedure: MAZE;  Surgeon: Rexene Alberts, MD;  Location: [place];  Service: Open Heart Surgery;  Laterality: N/A;  Cryomaze   . Radiofrequency Ablation for atrial fibrillation  12/30/07 and 06/05/09   afib ablation x 2 by JA  . Radiofrequency  ablation for atrial flutter  2005   CTI  . TOTAL KNEE ARTHROPLASTY Right 04/05/2019   Procedure: RIGHT TOTAL KNEE ARTHROPLASTY;  Surgeon: [place] Nakayama, MD;  [facility];  Service: Orthopedics;  Laterality: Right;    There were no vitals filed for this visit.  Subjective Assessment - 06/09/19 1055    Subjective  Doing well.    Currently in Pain?  No/denies         [facility] PT Assessment - 06/09/19 0001      Assessment   Medical Diagnosis  RT TKA    Referring Provider (PT)  Genevive Bi      PROM   Right Knee Extension  -10    Right Knee Flexion  130      Strength   Right Knee Flexion  5/5    Right Knee Extension  5/5      Ambulation/Gait    Assistive device  None    Gait Pattern  Step-through pattern    Ambulation Surface  Level;Unlevel;Indoor;Outdoor    Gait Comments  able to walk 9000 steps walking dog                   [facility] Adult PT Treatment/Exercise - 06/09/19 0001      Knee/Hip Exercises: Aerobic   Nustep  L6 LE 6 minutes       Knee/Hip Exercises: Machines for Strengthening   Cybex Leg Press  3 plates  5W65   LT knee  stopped  exercise     Knee/Hip Exercises: Standing   Forward Step Up  Right;15 reps;Hand Hold: 2;Step Height: 8"    Forward Step Up Limitations  then 10 more reps    Step Down  Right;Step Height: 4";Hand Hold: 2;20 reps      Knee/Hip Exercises: Seated   Long Arc Quad  Right;20 reps    Long Arc Quad Weight  7 lbs.               PT Short Term Goals - 05/23/19 1032      PT SHORT TERM GOAL #1   Title  He will be indpendent with initial hEp    Status  Achieved      PT SHORT TERM GOAL #2   Title  He will be walking out of home with least restrictive device    Status  Achieved      PT SHORT TERM GOAL #3   Title  He will walk full time in home with [facility]    Status  Achieved      PT SHORT TERM GOAL #4   Title  Active RT knee flexion to 100 degrees or better    Status  Achieved        PT Long Term Goals - 05/23/19 1019      PT LONG TERM GOAL #1   Title  He will be indpendent with all HEP issued    Time  12    Period  Weeks    Status  On-going      PT LONG TERM GOAL #2   Title  He will walk in home no device and out of home least restricted device    Baseline  he is beginning walk without the cane now    Time  12    Period  Weeks    Status  On-going      PT LONG TERM GOAL #3   Title  He will be independent with all normal home  tasks    Time  12    Period  Weeks    Status  Achieved      PT LONG TERM GOAL #4   Title  He will return to walking dog 1/4 mile for exercise    Time  12    Period  Weeks    Status  Partially Met      PT LONG TERM GOAL #5    Title  He will improve flexion to 120 degrees active and extension to -5-10 degrees to improve gait pattern and  tolerance    Status  Achieved      PT LONG TERM GOAL #6   Title  He will return to work at [employer]    Time  12    Period  Weeks    Status  On-going            Plan - 06/09/19 1059    Clinical Impression Statement  Swelling down and less movemnt of patella noted.  Worked all day yesterday with mild to mod discomfort.   Prgressing well . LT knee pain tends to stop bilateral activity/exercise    PT Treatment/Interventions  Taping;Manual techniques;Passive range of motion;Patient/family education;Therapeutic exercise;Therapeutic activities;Stair training;Gait training;Vasopneumatic Device;Cryotherapy    PT Next Visit Plan  contniue balane/gait, strength , vaso, manual, STW post knee    PT Home Exercise Plan  All HEP reviewed and pt independent (HHPT HEP quad set, SAQ, LAQ, assisted knee flexion in sitting, heel slides SLR, side hip abduction), ITB  Tennis ball STW    Consulted and Agree with Plan of Care  Patient       Patient will benefit from skilled therapeutic intervention in order to improve the following deficits and impairments:  Pain, Difficulty walking, Decreased range of motion, Decreased balance, Decreased strength, Increased edema  Visit Diagnosis: Stiffness of right knee, not elsewhere classified  Difficulty in walking, not elsewhere classified  Localized edema  Status post total right knee replacement     Problem List Patient Active Problem List   Diagnosis Date Noted  . Primary osteoarthritis of right knee 04/05/2019  . CKD (chronic kidney disease) stage 3, GFR 30-59 ml/min 03/14/2019  . Degenerative joint disease of knee, right 09/27/2018  . Dyspnea 09/06/2018  . Right knee pain 09/06/2018  . Radiculitis of left cervical region 03/05/2018  . Plantar fasciitis, left 08/19/2017  . Chest pain 02/27/2017  . Right ear pain 02/27/2017  . Left  groin pain 09/13/2016  . Rib pain on left side 09/13/2016  . Left leg pain 09/13/2016  . Stenosis of cervical spine with myelopathy ([place]) 06/20/2016  . Special screening for malignant neoplasms, colon 04/04/2016  . Cough 01/11/2016  . COPD exacerbation ([place]) 12/22/2015  . Rectal bleeding 08/14/2014  . Intercostal muscle strain 08/07/2014  . Chronic meniscal tear of knee 04/06/2014  . Heel bone fracture 04/06/2014  . Hyperglycemia 04/04/2014  . Recurrent falls 04/04/2014  . Bilateral knee pain 04/04/2014  . Intractable left heel pain 04/04/2014  . Insomnia 04/04/2014  . Hematochezia 04/04/2014  . Bladder neck obstruction 12/19/2013  . Encounter for therapeutic drug monitoring 03/30/2013  . Palpitations 07/06/2012  . Shortness of breath 01/19/2012  . Sick sinus syndrome ([place]) 12/01/2011  . S/P CABG x 1 11/26/2011  . S/P Maze operation for atrial fibrillation 11/26/2011  . CAD (coronary artery disease) 11/25/2011  . Paroxysmal atrial fibrillation ([place]) 11/24/2011  . Chronic combined systolic (congestive) and diastolic (congestive) heart failure ([place]) 11/24/2011  .  Gross hematuria 12/12/2010  . Left knee pain 12/12/2010  . Left knee DJD 07/09/2010  . Preventative health care 07/07/2010  . Chronic anticoagulation 04/08/2010  . COPD (chronic obstructive pulmonary disease) ([place]) 10/04/2008  . OBSTRUCTIVE SLEEP APNEA 01/06/2008  . ALLERGIC RHINITIS 01/14/2007  . Hypothyroidism 09/28/2006  . Hyperlipidemia 09/28/2006  . Anxiety state 09/28/2006  . Essential hypertension 09/28/2006    Darrel Hoover  PT 06/09/2019, 11:46 AM  [facility] [address] [place], [place], [address] Phone: [phone]   Fax:  [phone]  Name: Scott [name]. MRN: [id number] Date of Birth: Apr 26, 1955

## 2019-06-10 DIAGNOSIS — Z9889 Other specified postprocedural states: Secondary | ICD-10-CM | POA: Diagnosis not present

## 2019-06-10 DIAGNOSIS — Z96651 Presence of right artificial knee joint: Secondary | ICD-10-CM | POA: Diagnosis not present

## 2019-06-13 ENCOUNTER — Other Ambulatory Visit: Payer: Self-pay

## 2019-06-13 ENCOUNTER — Ambulatory Visit: Payer: PPO

## 2019-06-13 DIAGNOSIS — R262 Difficulty in walking, not elsewhere classified: Secondary | ICD-10-CM | POA: Diagnosis not present

## 2019-06-13 DIAGNOSIS — R600 Localized edema: Secondary | ICD-10-CM

## 2019-06-13 DIAGNOSIS — Z96651 Presence of right artificial knee joint: Secondary | ICD-10-CM

## 2019-06-13 DIAGNOSIS — M25661 Stiffness of right knee, not elsewhere classified: Secondary | ICD-10-CM

## 2019-06-13 NOTE — Therapy (Signed)
[place], [place], [address] Phone: [phone]   Fax:  [phone]  Physical Therapy Treatment  Patient Details  Name: Scott [name]. MRN: [id number] Date of Birth: 01-14-1956 Referring Provider (PT): Genevive Bi   Encounter Date: 06/13/2019  PT End of Session - 06/13/19 1033    Visit Number  17    Number of Visits  24    Date for PT Re-Evaluation  07/08/19    Authorization Type  Health team advantage    Authorization Time Period  progress visit 10       KX visit 15    PT Start Time  1033    PT Stop Time  1120    PT Time Calculation (min)  47 min    Activity Tolerance  Patient tolerated treatment well    Behavior During Therapy  WFL for tasks assessed/performed       Past Medical History:  Diagnosis Date  . ALLERGIC RHINITIS 01/14/2007  . ANXIETY 09/28/2006  . Asbestos exposure   . Atrial fibrillation ([place]) 09/28/2006   s/p afib ablation x 2  . CHF (congestive heart failure) ([place])   . CKD (chronic kidney disease), stage III    patient denies  . COPD (chronic obstructive pulmonary disease) ([place])   . Coronary artery disease 11/24/2011  . Diastolic dysfunction [id number]  . Dyspnea    W/ EXERTION   . Dysrhythmia    AFIB     . First degree AV block 10/12/2018   Noted on EKG  . Gallstones   . Hyperglycemia   . HYPERLIPIDEMIA 09/28/2006  . HYPERTENSION 09/28/2006  . HYPOTHYROIDISM, ACQUIRED NEC 09/28/2006   pt denies and doesn't know  . Long term current use of anticoagulant 04/08/2010  . Obese   . OBSTRUCTIVE SLEEP APNEA 01/06/2008   NOT USING    . Right knee DJD 07/09/2010  . S/P CABG x 1 11/26/2011   Right internal mammary artery to right coronary artery  . S/P Maze operation for atrial fibrillation 11/26/2011   complete biatrial lesion set with clipping of LA appendage    Past Surgical History:  Procedure Laterality Date  . ANTERIOR CERVICAL DECOMP/DISCECTOMY FUSION N/A 06/20/2016   Procedure: Anterior Cervical Discectomy Fusion - Cervical five-Cervical six - Cervical six-Cervical seven - Cervicla seven- Thoracic one;  Surgeon: Earnie Larsson, MD;  Location: [place];  Service: Neurosurgery;  Laterality: N/A;  . CARDIOVERSION     X4   . CORONARY ARTERY BYPASS GRAFT  11/26/2011   Procedure: CORONARY ARTERY BYPASS GRAFTING (CABG);  Surgeon: Rexene Alberts, MD;  Location: [place];  Service: Open Heart Surgery;  Laterality: N/A;  coronary artery bypass on pump times one utilizing the right internal mammary artery, transesophageal echocardiogram   . HAND SURGERY Left   . LEFT HEART CATHETERIZATION WITH CORONARY ANGIOGRAM N/A 11/24/2011   Procedure: LEFT HEART CATHETERIZATION WITH CORONARY ANGIOGRAM;  Surgeon: Peter M Martinique, MD;  Location: [facility] CATH LAB;  Service: Cardiovascular;  Laterality: N/A;  . LOOP RECORDER IMPLANT N/A 07/06/2012   Procedure: LOOP RECORDER IMPLANT;  Surgeon: Thompson Grayer, MD;  Location: [facility] CATH LAB;  Service: Cardiovascular;  Laterality: N/A;  . MAZE  11/26/2011   Procedure: MAZE;  Surgeon: Rexene Alberts, MD;  Location: [place];  Service: Open Heart Surgery;  Laterality: N/A;  Cryomaze   . Radiofrequency Ablation for atrial fibrillation  12/30/07 and 06/05/09   afib ablation x 2 by JA  . Radiofrequency  ablation for atrial flutter  2005   CTI  . TOTAL KNEE ARTHROPLASTY Right 04/05/2019   Procedure: RIGHT TOTAL KNEE ARTHROPLASTY;  Surgeon: [place] Nakayama, MD;  [facility];  Service: Orthopedics;  Laterality: Right;    There were no vitals filed for this visit.  Subjective Assessment - 06/13/19 1034    Subjective  No new complaints    Currently in Pain?  No/denies                       [facility] Adult PT Treatment/Exercise - 06/13/19 0001      Knee/Hip Exercises: Aerobic   Nustep  L6 LE m inutes       Knee/Hip Exercises: Standing   Hip Flexion  Right;Left;15 reps    Hip Flexion Limitations  red band    Forward Lunges  Right;Left;15 reps     Side Lunges  Right;Left;15 reps    Hip Abduction  Right;Left;15 reps;Knee straight    Abduction Limitations  red band    Hip Extension  Right;Left;15 reps    Extension Limitations  red band    Forward Step Up  Right;15 reps;Hand Hold: 2;Step Height: 8"    Step Down  Right;Step Height: 4";Hand Hold: 2;20 reps    Functional Squat  20 reps    Functional Squat Limitations  --    Lunge Walking - Round Trips  RT.LT x 15 With small ROM to comfort.     Other Standing Knee Exercises  step downs 4 inch x 15 2 UE assist     Other Standing Knee Exercises  side stepping , marching high knees in bars      Knee/Hip Exercises: Seated   Long Arc Quad  Right    Long Arc Quad Weight  7 lbs.    Long Arc Quad Limitations  30 reps               PT Short Term Goals - 05/23/19 1032      PT SHORT TERM GOAL #1   Title  He will be indpendent with initial hEp    Status  Achieved      PT SHORT TERM GOAL #2   Title  He will be walking out of home with least restrictive device    Status  Achieved      PT SHORT TERM GOAL #3   Title  He will walk full time in home with [facility]    Status  Achieved      PT SHORT TERM GOAL #4   Title  Active RT knee flexion to 100 degrees or better    Status  Achieved        PT Long Term Goals - 06/13/19 1130      PT LONG TERM GOAL #1   Title  He will be indpendent with all HEP issued    Status  On-going      PT LONG TERM GOAL #2   Title  He will walk in home no device and out of home least restricted device    Status  Achieved      PT LONG TERM GOAL #3   Title  He will be independent with all normal home tasks    Status  Achieved      PT LONG TERM GOAL #4   Title  He will return to walking dog 1/4 mile for exercise    Status  Achieved      PT LONG TERM GOAL #  5   Title  He will improve flexion to 120 degrees active and extension to -5-10 degrees to improve gait pattern and  tolerance    Status  Achieved      PT LONG TERM GOAL #6   Title  He will  return to work at [employer] - 06/13/19 1033    Clinical Impression Statement  Md assured Scott [name] was doing well and he needs ttime to fully heal and progress function.  Continues with Lt knee pain with closed chain exercise but this doe not last  after he ices at home.   Will continue to push as pain allows.    PT Treatment/Interventions  Taping;Manual techniques;Passive range of motion;Patient/family education;Therapeutic exercise;Therapeutic activities;Stair training;Gait training;Vasopneumatic Device;Cryotherapy    PT Next Visit Plan  contniue balance/gait, strength , vaso, manual, STW post knee if needed    PT Home Exercise Plan  All HEP reviewed and pt independent (HHPT HEP quad set, SAQ, LAQ, assisted knee flexion in sitting, heel slides SLR, side hip abduction), ITB  Tennis ball STW    Consulted and Agree with Plan of Care  Patient       Patient will benefit from skilled therapeutic intervention in order to improve the following deficits and impairments:  Pain, Difficulty walking, Decreased range of motion, Decreased balance, Decreased strength, Increased edema  Visit Diagnosis: Stiffness of right knee, not elsewhere classified  Difficulty in walking, not elsewhere classified  Localized edema  Status post total right knee replacement     Problem List Patient Active Problem List   Diagnosis Date Noted  . Primary osteoarthritis of right knee 04/05/2019  . CKD (chronic kidney disease) stage 3, GFR 30-59 ml/min 03/14/2019  . Degenerative joint disease of knee, right 09/27/2018  . Dyspnea 09/06/2018  . Right knee pain 09/06/2018  . Radiculitis of left cervical region 03/05/2018  . Plantar fasciitis, left 08/19/2017  . Chest pain 02/27/2017  . Right ear pain 02/27/2017  . Left groin pain 09/13/2016  . Rib pain on left side 09/13/2016  . Left leg pain 09/13/2016  . Stenosis of cervical spine with myelopathy ([place]) 06/20/2016  .  Special screening for malignant neoplasms, colon 04/04/2016  . Cough 01/11/2016  . COPD exacerbation ([place]) 12/22/2015  . Rectal bleeding 08/14/2014  . Intercostal muscle strain 08/07/2014  . Chronic meniscal tear of knee 04/06/2014  . Heel bone fracture 04/06/2014  . Hyperglycemia 04/04/2014  . Recurrent falls 04/04/2014  . Bilateral knee pain 04/04/2014  . Intractable left heel pain 04/04/2014  . Insomnia 04/04/2014  . Hematochezia 04/04/2014  . Bladder neck obstruction 12/19/2013  . Encounter for therapeutic drug monitoring 03/30/2013  . Palpitations 07/06/2012  . Shortness of breath 01/19/2012  . Sick sinus syndrome ([place]) 12/01/2011  . S/P CABG x 1 11/26/2011  . S/P Maze operation for atrial fibrillation 11/26/2011  . CAD (coronary artery disease) 11/25/2011  . Paroxysmal atrial fibrillation ([place]) 11/24/2011  . Chronic combined systolic (congestive) and diastolic (congestive) heart failure ([place]) 11/24/2011  . Gross hematuria 12/12/2010  . Left knee pain 12/12/2010  . Left knee DJD 07/09/2010  . Preventative health care 07/07/2010  . Chronic anticoagulation 04/08/2010  . COPD (chronic obstructive pulmonary disease) ([place]) 10/04/2008  . OBSTRUCTIVE SLEEP APNEA 01/06/2008  . ALLERGIC RHINITIS 01/14/2007  . Hypothyroidism 09/28/2006  . Hyperlipidemia 09/28/2006  . Anxiety state 09/28/2006  . Essential hypertension 09/28/2006  Darrel Hoover PT 06/13/2019, 11:35 AM  [facility] [address] [place], [place], [address] Phone: [phone]   Fax:  [phone]  Name: Scott [name]. MRN: [id number] Date of Birth: 1955/07/30

## 2019-06-16 ENCOUNTER — Ambulatory Visit: Payer: PPO

## 2019-06-16 ENCOUNTER — Other Ambulatory Visit: Payer: Self-pay

## 2019-06-16 DIAGNOSIS — M25661 Stiffness of right knee, not elsewhere classified: Secondary | ICD-10-CM

## 2019-06-16 DIAGNOSIS — R600 Localized edema: Secondary | ICD-10-CM

## 2019-06-16 DIAGNOSIS — Z96651 Presence of right artificial knee joint: Secondary | ICD-10-CM

## 2019-06-16 DIAGNOSIS — R262 Difficulty in walking, not elsewhere classified: Secondary | ICD-10-CM

## 2019-06-16 NOTE — Therapy (Signed)
[place], [place], [address] Phone: [phone]   Fax:  [phone]  Physical Therapy Treatment  Patient Details  Name: Scott [name]. MRN: [id number] Date of Birth: Jan 13, 1956 Referring Provider (PT): Genevive Bi   Encounter Date: 06/16/2019  PT End of Session - 06/16/19 1104    Visit Number  18    Number of Visits  24    Date for PT Re-Evaluation  07/08/19    Authorization Type  Health team advantage    Authorization Time Period  progress visit 10       KX visit 15    PT Start Time  1040    PT Stop Time  1120    PT Time Calculation (min)  40 min    Activity Tolerance  Patient tolerated treatment well;Patient limited by pain    Behavior During Therapy  [facility] for tasks assessed/performed       Past Medical History:  Diagnosis Date  . ALLERGIC RHINITIS 01/14/2007  . ANXIETY 09/28/2006  . Asbestos exposure   . Atrial fibrillation ([place]) 09/28/2006   s/p afib ablation x 2  . CHF (congestive heart failure) ([place])   . CKD (chronic kidney disease), stage III    patient denies  . COPD (chronic obstructive pulmonary disease) ([place])   . Coronary artery disease 11/24/2011  . Diastolic dysfunction 99991111  . Dyspnea    W/ EXERTION   . Dysrhythmia    AFIB     . First degree AV block 10/12/2018   Noted on EKG  . Gallstones   . Hyperglycemia   . HYPERLIPIDEMIA 09/28/2006  . HYPERTENSION 09/28/2006  . HYPOTHYROIDISM, ACQUIRED NEC 09/28/2006   pt denies and doesn't know  . Long term current use of anticoagulant 04/08/2010  . Obese   . OBSTRUCTIVE SLEEP APNEA 01/06/2008   NOT USING    . Right knee DJD 07/09/2010  . S/P CABG x 1 11/26/2011   Right internal mammary artery to right coronary artery  . S/P Maze operation for atrial fibrillation 11/26/2011   complete biatrial lesion set with clipping of LA appendage    Past Surgical History:  Procedure Laterality Date  . ANTERIOR CERVICAL DECOMP/DISCECTOMY  FUSION N/A 06/20/2016   Procedure: Anterior Cervical Discectomy Fusion - Cervical five-Cervical six - Cervical six-Cervical seven - Cervicla seven- Thoracic one;  Surgeon: Earnie Larsson, MD;  Location: [place];  Service: Neurosurgery;  Laterality: N/A;  . CARDIOVERSION     X4   . CORONARY ARTERY BYPASS GRAFT  11/26/2011   Procedure: CORONARY ARTERY BYPASS GRAFTING (CABG);  Surgeon: Rexene Alberts, MD;  Location: [place];  Service: Open Heart Surgery;  Laterality: N/A;  coronary artery bypass on pump times one utilizing the right internal mammary artery, transesophageal echocardiogram   . HAND SURGERY Left   . LEFT HEART CATHETERIZATION WITH CORONARY ANGIOGRAM N/A 11/24/2011   Procedure: LEFT HEART CATHETERIZATION WITH CORONARY ANGIOGRAM;  Surgeon: Peter M Martinique, MD;  Location: [facility] CATH LAB;  Service: Cardiovascular;  Laterality: N/A;  . LOOP RECORDER IMPLANT N/A 07/06/2012   Procedure: LOOP RECORDER IMPLANT;  Surgeon: Thompson Grayer, MD;  Location: [facility] CATH LAB;  Service: Cardiovascular;  Laterality: N/A;  . MAZE  11/26/2011   Procedure: MAZE;  Surgeon: Rexene Alberts, MD;  Location: [place];  Service: Open Heart Surgery;  Laterality: N/A;  Cryomaze   . Radiofrequency Ablation for atrial fibrillation  12/30/07 and 06/05/09   afib ablation x 2 by  JA  . Radiofrequency ablation for atrial flutter  2005   CTI  . TOTAL KNEE ARTHROPLASTY Right 04/05/2019   Procedure: RIGHT TOTAL KNEE ARTHROPLASTY;  Surgeon: Melrose Nakayama, MD;  [facility];  Service: Orthopedics;  Laterality: Right;    There were no vitals filed for this visit.  Subjective Assessment - 06/16/19 1102    Subjective  doing fine.. 1-2 pain. Walking better though hills still cause more effort/fatigue    Pain Score  2     Pain Location  Knee    Pain Orientation  Right    Pain Descriptors / Indicators  Discomfort    Pain Type  Surgical pain    Pain Onset  More than a month ago    Pain Frequency  Intermittent    Aggravating Factors   walking     Pain Relieving Factors  cold                       [facility] Adult PT Treatment/Exercise - 06/16/19 0001      Knee/Hip Exercises: Aerobic   Nustep  L6 LE m 8 inutes       Knee/Hip Exercises: Standing   Heel Raises  Both;20 reps    Knee Flexion  Right;Left;2 sets;10 reps    Knee Flexion Limitations  5#    Hip Flexion  Right;Left;20 reps    Hip Flexion Limitations  5#    Forward Lunges  Right;Left;20 reps    Forward Lunges Limitations  5#    Side Lunges  Right;Left;15 reps    Side Lunges Limitations  5#    Hip Abduction  Right;Left;20 reps    Abduction Limitations  5#    Hip Extension  Right;Left;15 reps    Extension Limitations  5#    Functional Squat  20 reps    Lunge Walking - Round Trips  RT.LT x 15 With small ROM to comfort.       Knee/Hip Exercises: Seated   Long Arc Quad  Right    Long Arc Quad Weight  5 lbs.    Long Arc Quad Limitations  30 reps               PT Short Term Goals - 05/23/19 1032      PT SHORT TERM GOAL #1   Title  He will be indpendent with initial hEp    Status  Achieved      PT SHORT TERM GOAL #2   Title  He will be walking out of home with least restrictive device    Status  Achieved      PT SHORT TERM GOAL #3   Title  He will walk full time in home with [facility]    Status  Achieved      PT SHORT TERM GOAL #4   Title  Active RT knee flexion to 100 degrees or better    Status  Achieved        PT Long Term Goals - 06/13/19 1130      PT LONG TERM GOAL #1   Title  He will be indpendent with all HEP issued    Status  On-going      PT LONG TERM GOAL #2   Title  He will walk in home no device and out of home least restricted device    Status  Achieved      PT LONG TERM GOAL #3   Title  He will be independent with  all normal home tasks    Status  Achieved      PT LONG TERM GOAL #4   Title  He will return to walking dog 1/4 mile for exercise    Status  Achieved      PT LONG TERM GOAL #5   Title  He will improve  flexion to 120 degrees active and extension to -5-10 degrees to improve gait pattern and  tolerance    Status  Achieved      PT LONG TERM GOAL #6   Title  He will return to work at [employer] - 06/16/19 1105    Clinical Impression Statement  Started step ups/downs and began to have increased knee pain. He walked and mowed grass yesterday so I felt he ha enough for today. i felt he was having more pain than usual. We will resume all closed chain exercise next weekand monitor painlevel buhe will have less pain.t wxpwct    PT Treatment/Interventions  Taping;Manual techniques;Passive range of motion;Patient/family education;Therapeutic exercise;Therapeutic activities;Stair training;Gait training;Vasopneumatic Device;Cryotherapy    PT Next Visit Plan  contniue balance/gait, strength , vaso, manual, STW post knee if needed    PT Home Exercise Plan  All HEP reviewed and pt independent (HHPT HEP quad set, SAQ, LAQ, assisted knee flexion in sitting, heel slides SLR, side hip abduction), ITB  Tennis ball STW    Consulted and Agree with Plan of Care  Patient       Patient will benefit from skilled therapeutic intervention in order to improve the following deficits and impairments:  Pain, Difficulty walking, Decreased range of motion, Decreased balance, Decreased strength, Increased edema  Visit Diagnosis: Stiffness of right knee, not elsewhere classified  Difficulty in walking, not elsewhere classified  Localized edema  Status post total right knee replacement     Problem List Patient Active Problem List   Diagnosis Date Noted  . Primary osteoarthritis of right knee 04/05/2019  . CKD (chronic kidney disease) stage 3, GFR 30-59 ml/min 03/14/2019  . Degenerative joint disease of knee, right 09/27/2018  . Dyspnea 09/06/2018  . Right knee pain 09/06/2018  . Radiculitis of left cervical region 03/05/2018  . Plantar fasciitis, left 08/19/2017  .  Chest pain 02/27/2017  . Right ear pain 02/27/2017  . Left groin pain 09/13/2016  . Rib pain on left side 09/13/2016  . Left leg pain 09/13/2016  . Stenosis of cervical spine with myelopathy ([place]) 06/20/2016  . Special screening for malignant neoplasms, colon 04/04/2016  . Cough 01/11/2016  . COPD exacerbation ([place]) 12/22/2015  . Rectal bleeding 08/14/2014  . Intercostal muscle strain 08/07/2014  . Chronic meniscal tear of knee 04/06/2014  . Heel bone fracture 04/06/2014  . Hyperglycemia 04/04/2014  . Recurrent falls 04/04/2014  . Bilateral knee pain 04/04/2014  . Intractable left heel pain 04/04/2014  . Insomnia 04/04/2014  . Hematochezia 04/04/2014  . Bladder neck obstruction 12/19/2013  . Encounter for therapeutic drug monitoring 03/30/2013  . Palpitations 07/06/2012  . Shortness of breath 01/19/2012  . Sick sinus syndrome ([place]) 12/01/2011  . S/P CABG x 1 11/26/2011  . S/P Maze operation for atrial fibrillation 11/26/2011  . CAD (coronary artery disease) 11/25/2011  . Paroxysmal atrial fibrillation ([place]) 11/24/2011  . Chronic combined systolic (congestive) and diastolic (congestive) heart failure ([place]) 11/24/2011  . Gross hematuria 12/12/2010  . Left knee pain 12/12/2010  . Left  knee DJD 07/09/2010  . Preventative health care 07/07/2010  . Chronic anticoagulation 04/08/2010  . COPD (chronic obstructive pulmonary disease) ([place]) 10/04/2008  . OBSTRUCTIVE SLEEP APNEA 01/06/2008  . ALLERGIC RHINITIS 01/14/2007  . Hypothyroidism 09/28/2006  . Hyperlipidemia 09/28/2006  . Anxiety state 09/28/2006  . Essential hypertension 09/28/2006    Darrel Hoover  PT 06/16/2019, 11:27 AM  [facility] [address] [place], [place], [address] Phone: [phone]   Fax:  [phone]  Name: Scott [name]. MRN: [id number] Date of Birth: 05/13/1955

## 2019-06-20 ENCOUNTER — Ambulatory Visit: Payer: PPO

## 2019-06-20 ENCOUNTER — Ambulatory Visit: Payer: PPO | Attending: Internal Medicine

## 2019-06-20 ENCOUNTER — Other Ambulatory Visit: Payer: Self-pay | Admitting: Internal Medicine

## 2019-06-20 ENCOUNTER — Other Ambulatory Visit: Payer: Self-pay

## 2019-06-20 DIAGNOSIS — R262 Difficulty in walking, not elsewhere classified: Secondary | ICD-10-CM

## 2019-06-20 DIAGNOSIS — Z96651 Presence of right artificial knee joint: Secondary | ICD-10-CM

## 2019-06-20 DIAGNOSIS — Z23 Encounter for immunization: Secondary | ICD-10-CM

## 2019-06-20 DIAGNOSIS — R600 Localized edema: Secondary | ICD-10-CM

## 2019-06-20 DIAGNOSIS — M25661 Stiffness of right knee, not elsewhere classified: Secondary | ICD-10-CM

## 2019-06-20 NOTE — Therapy (Signed)
[place], [place], [address] Phone: [phone]   Fax:  [phone]  Physical Therapy Treatment  Patient Details  Name: Scott [name]. MRN: [id number] Date of Birth: 1955/09/07 Referring Provider (PT): Genevive Bi   Encounter Date: 06/20/2019  PT End of Session - 06/20/19 1053    Visit Number  19    Number of Visits  28    Date for PT Re-Evaluation  07/22/19    Authorization Type  Health team advantage    Authorization Time Period  progress visit 20       KX visit 15    PT Start Time  1040    PT Stop Time  1130    PT Time Calculation (min)  50 min    Activity Tolerance  Patient tolerated treatment well;Patient limited by pain    Behavior During Therapy  [facility] for tasks assessed/performed       Past Medical History:  Diagnosis Date  . ALLERGIC RHINITIS 01/14/2007  . ANXIETY 09/28/2006  . Asbestos exposure   . Atrial fibrillation ([place]) 09/28/2006   s/p afib ablation x 2  . CHF (congestive heart failure) ([place])   . CKD (chronic kidney disease), stage III    patient denies  . COPD (chronic obstructive pulmonary disease) ([place])   . Coronary artery disease 11/24/2011  . Diastolic dysfunction 99991111  . Dyspnea    W/ EXERTION   . Dysrhythmia    AFIB     . First degree AV block 10/12/2018   Noted on EKG  . Gallstones   . Hyperglycemia   . HYPERLIPIDEMIA 09/28/2006  . HYPERTENSION 09/28/2006  . HYPOTHYROIDISM, ACQUIRED NEC 09/28/2006   pt denies and doesn't know  . Long term current use of anticoagulant 04/08/2010  . Obese   . OBSTRUCTIVE SLEEP APNEA 01/06/2008   NOT USING    . Right knee DJD 07/09/2010  . S/P CABG x 1 11/26/2011   Right internal mammary artery to right coronary artery  . S/P Maze operation for atrial fibrillation 11/26/2011   complete biatrial lesion set with clipping of LA appendage    Past Surgical History:  Procedure Laterality Date  . ANTERIOR CERVICAL DECOMP/DISCECTOMY  FUSION N/A 06/20/2016   Procedure: Anterior Cervical Discectomy Fusion - Cervical five-Cervical six - Cervical six-Cervical seven - Cervicla seven- Thoracic one;  Surgeon: Earnie Larsson, MD;  Location: [place];  Service: Neurosurgery;  Laterality: N/A;  . CARDIOVERSION     X4   . CORONARY ARTERY BYPASS GRAFT  11/26/2011   Procedure: CORONARY ARTERY BYPASS GRAFTING (CABG);  Surgeon: Rexene Alberts, MD;  Location: [place];  Service: Open Heart Surgery;  Laterality: N/A;  coronary artery bypass on pump times one utilizing the right internal mammary artery, transesophageal echocardiogram   . HAND SURGERY Left   . LEFT HEART CATHETERIZATION WITH CORONARY ANGIOGRAM N/A 11/24/2011   Procedure: LEFT HEART CATHETERIZATION WITH CORONARY ANGIOGRAM;  Surgeon: Peter M Martinique, MD;  Location: [facility] CATH LAB;  Service: Cardiovascular;  Laterality: N/A;  . LOOP RECORDER IMPLANT N/A 07/06/2012   Procedure: LOOP RECORDER IMPLANT;  Surgeon: Thompson Grayer, MD;  Location: [facility] CATH LAB;  Service: Cardiovascular;  Laterality: N/A;  . MAZE  11/26/2011   Procedure: MAZE;  Surgeon: Rexene Alberts, MD;  Location: [place];  Service: Open Heart Surgery;  Laterality: N/A;  Cryomaze   . Radiofrequency Ablation for atrial fibrillation  12/30/07 and 06/05/09   afib ablation x 2 by  JA  . Radiofrequency ablation for atrial flutter  2005   CTI  . TOTAL KNEE ARTHROPLASTY Right 04/05/2019   Procedure: RIGHT TOTAL KNEE ARTHROPLASTY;  Surgeon: Melrose Nakayama, MD;  [facility];  Service: Orthopedics;  Laterality: Right;    There were no vitals filed for this visit.  Subjective Assessment - 06/20/19 1123    Subjective  RT pain patella tendon after  standing on  asphalt  4 hours this weekend. Steeper hills are difficult. , turn slower,   Getting out of back seat of car.    Pain Location  Knee    Pain Orientation  Right    Pain Descriptors / Indicators  Discomfort    Pain Type  Surgical pain    Pain Onset  More than a month ago    Pain Frequency   Constant                       [facility] Adult PT Treatment/Exercise - 06/20/19 0001      Knee/Hip Exercises: Aerobic   Nustep  L6 LE m 12 inutes       Knee/Hip Exercises: Standing   Heel Raises  Both;20 reps    Knee Flexion  Right;Left;2 sets;10 reps    Knee Flexion Limitations  7#    Hip Flexion  Right;Left;20 reps    Hip Flexion Limitations  7#    Hip Abduction  Right;Left;20 reps    Abduction Limitations  7#    Hip Extension  Right;Left;20 reps    Extension Limitations  7#    Other Standing Knee Exercises  side stepping , iwth red band 12 feet x 5 RT/LT       Knee/Hip Exercises: Seated   Long Arc Quad  Right    Long Arc Quad Weight  7 lbs.    Long [employer] Limitations  30    Sit to [employer]  10 reps;without UE support      Manual Therapy   Manual Therapy  Taping    Manual therapy comments  kineseotape cross patella tendon and latearl to patella across joint flexed kneed laid on tape               PT Short Term Goals - 05/23/19 1032      PT SHORT TERM GOAL #1   Title  He will be indpendent with initial hEp    Status  Achieved      PT SHORT TERM GOAL #2   Title  He will be walking out of home with least restrictive device    Status  Achieved      PT SHORT TERM GOAL #3   Title  He will walk full time in home with [employer]    Status  Achieved      PT SHORT TERM GOAL #4   Title  Active RT knee flexion to 100 degrees or better    Status  Achieved        PT Long Term Goals - 06/13/19 1130      PT LONG TERM GOAL #1   Title  He will be indpendent with all HEP issued    Status  On-going      PT LONG TERM GOAL #2   Title  He will walk in home no device and out of home least restricted device    Status  Achieved      PT LONG TERM GOAL #3   Title  He will be  independent with all normal home tasks    Status  Achieved      PT LONG TERM GOAL #4   Title  He will return to walking dog 1/4 mile for exercise    Status  Achieved      PT LONG TERM GOAL  #5   Title  He will improve flexion to 120 degrees active and extension to -5-10 degrees to improve gait pattern and  tolerance    Status  Achieved      PT LONG TERM GOAL #6   Title  He will return to work at [employer] - 06/20/19 1054    Clinical Impression Statement  Psain beginnig to limit pt. He reprted getting on his knee on floor a week or so ago with severe pain so may be more sensitive  activity now. Needs more strength in flexion closed chain for etting out of cars and inclines    PT Treatment/Interventions  Taping;Manual techniques;Passive range of motion;Patient/family education;Therapeutic exercise;Therapeutic activities;Stair training;Gait training;Vasopneumatic Device;Cryotherapy    PT Next Visit Plan  contniue balance/gait, strength , vaso, manual, STW post knee if needed  progress note next    PT Home Exercise Plan  All HEP reviewed and pt independent (HHPT HEP quad set, SAQ, LAQ, assisted knee flexion in sitting, heel slides SLR, side hip abduction), ITB  Tennis ball STW    Consulted and Agree with Plan of Care  Patient       Patient will benefit from skilled therapeutic intervention in order to improve the following deficits and impairments:  Pain, Difficulty walking, Decreased range of motion, Decreased balance, Decreased strength, Increased edema  Visit Diagnosis: Stiffness of right knee, not elsewhere classified  Difficulty in walking, not elsewhere classified  Localized edema  Status post total right knee replacement     Problem List Patient Active Problem List   Diagnosis Date Noted  . Primary osteoarthritis of right knee 04/05/2019  . CKD (chronic kidney disease) stage 3, GFR 30-59 ml/min 03/14/2019  . Degenerative joint disease of knee, right 09/27/2018  . Dyspnea 09/06/2018  . Right knee pain 09/06/2018  . Radiculitis of left cervical region 03/05/2018  . Plantar fasciitis, left 08/19/2017  . Chest pain  02/27/2017  . Right ear pain 02/27/2017  . Left groin pain 09/13/2016  . Rib pain on left side 09/13/2016  . Left leg pain 09/13/2016  . Stenosis of cervical spine with myelopathy ([place]) 06/20/2016  . Special screening for malignant neoplasms, colon 04/04/2016  . Cough 01/11/2016  . COPD exacerbation ([place]) 12/22/2015  . Rectal bleeding 08/14/2014  . Intercostal muscle strain 08/07/2014  . Chronic meniscal tear of knee 04/06/2014  . Heel bone fracture 04/06/2014  . Hyperglycemia 04/04/2014  . Recurrent falls 04/04/2014  . Bilateral knee pain 04/04/2014  . Intractable left heel pain 04/04/2014  . Insomnia 04/04/2014  . Hematochezia 04/04/2014  . Bladder neck obstruction 12/19/2013  . Encounter for therapeutic drug monitoring 03/30/2013  . Palpitations 07/06/2012  . Shortness of breath 01/19/2012  . Sick sinus syndrome ([place]) 12/01/2011  . S/P CABG x 1 11/26/2011  . S/P Maze operation for atrial fibrillation 11/26/2011  . CAD (coronary artery disease) 11/25/2011  . Paroxysmal atrial fibrillation ([place]) 11/24/2011  . Chronic combined systolic (congestive) and diastolic (congestive) heart failure ([place]) 11/24/2011  . Gross hematuria 12/12/2010  . Left knee pain 12/12/2010  . Left knee  DJD 07/09/2010  . Preventative health care 07/07/2010  . Chronic anticoagulation 04/08/2010  . COPD (chronic obstructive pulmonary disease) ([place]) 10/04/2008  . OBSTRUCTIVE SLEEP APNEA 01/06/2008  . ALLERGIC RHINITIS 01/14/2007  . Hypothyroidism 09/28/2006  . Hyperlipidemia 09/28/2006  . Anxiety state 09/28/2006  . Essential hypertension 09/28/2006    Darrel Hoover PT 06/20/2019, 11:37 AM  [facility] [address] [place], [place], [address] Phone: [phone]   Fax:  [phone]  Name: Md Gory. MRN: [id number] Date of Birth: March 03, 1955

## 2019-06-20 NOTE — Progress Notes (Signed)
   [id number]  Name:  Zakery Daffin.    MRN: [id number] DOB: January 21, 1956  06/20/2019  [name] was observed post Covid-19 immunization for 15 minutes without incident. He was provided with Vaccine Information Sheet and instruction to access the V-Safe system.   Mr. Joanne Chars was instructed to call 911 with any severe reactions post vaccine: Marland Kitchen Difficulty breathing  . Swelling of face and throat  . A fast heartbeat  . A bad rash all over body  . Dizziness and weakness   Immunizations Administered    Name Date Dose VIS Date Route   Pfizer COVID-19 Vaccine 06/20/2019  8:12 AM 0.3 mL 04/20/2018 Intramuscular   Manufacturer: [place]   Lot: [id number]   [place]: [id number]      Covid-19 Vaccination Clinic  Name:  Kolton Hollander.    MRN: [id number] DOB: 01/23/1956  06/20/2019  [name] was observed post Covid-19 immunization for 15 minutes without incident. He was provided with Vaccine Information Sheet and instruction to access the V-Safe system.   Mr. Joanne Chars was instructed to call 911 with any severe reactions post vaccine: Marland Kitchen Difficulty breathing  . Swelling of face and throat  . A fast heartbeat  . A bad rash all over body  . Dizziness and weakness   Immunizations Administered    Name Date Dose VIS Date Route   Pfizer COVID-19 Vaccine 06/20/2019  8:12 AM 0.3 mL 04/20/2018 Intramuscular   Manufacturer: [place]   Lot: [id number]   [place]: [id number]

## 2019-06-23 ENCOUNTER — Other Ambulatory Visit: Payer: Self-pay

## 2019-06-23 ENCOUNTER — Ambulatory Visit: Payer: PPO

## 2019-06-23 DIAGNOSIS — R262 Difficulty in walking, not elsewhere classified: Secondary | ICD-10-CM

## 2019-06-23 DIAGNOSIS — M25561 Pain in right knee: Secondary | ICD-10-CM

## 2019-06-23 DIAGNOSIS — Z96651 Presence of right artificial knee joint: Secondary | ICD-10-CM

## 2019-06-23 DIAGNOSIS — M25661 Stiffness of right knee, not elsewhere classified: Secondary | ICD-10-CM

## 2019-06-23 DIAGNOSIS — R600 Localized edema: Secondary | ICD-10-CM

## 2019-06-23 NOTE — Therapy (Signed)
[place], [place], [address] Phone: [phone]   Fax:  [phone]  Physical Therapy Treatment  Patient Details  Name: Scott [name]. MRN: [id number] Date of Birth: 1955-12-18 Referring Provider (PT): Genevive Bi  Progress Note Reporting Period 05/19/19 to 06/23/19  See note below for Objective Data and Assessment of Progress/Goals.      Encounter Date: 06/23/2019  PT End of Session - 06/23/19 1050    Visit Number  20    Number of Visits  28    Date for PT Re-Evaluation  07/22/19    Authorization Type  Health team advantage    Authorization Time Period  progress visit 20       KX visit 15    PT Start Time  1040    PT Stop Time  1130    PT Time Calculation (min)  50 min    Activity Tolerance  Patient tolerated treatment well;Patient limited by pain    Behavior During Therapy  WFL for tasks assessed/performed       Past Medical History:  Diagnosis Date  . ALLERGIC RHINITIS 01/14/2007  . ANXIETY 09/28/2006  . Asbestos exposure   . Atrial fibrillation ([place]) 09/28/2006   s/p afib ablation x 2  . CHF (congestive heart failure) ([place])   . CKD (chronic kidney disease), stage III    patient denies  . COPD (chronic obstructive pulmonary disease) ([place])   . Coronary artery disease 11/24/2011  . Diastolic dysfunction 99991111  . Dyspnea    W/ EXERTION   . Dysrhythmia    AFIB     . First degree AV block 10/12/2018   Noted on EKG  . Gallstones   . Hyperglycemia   . HYPERLIPIDEMIA 09/28/2006  . HYPERTENSION 09/28/2006  . HYPOTHYROIDISM, ACQUIRED NEC 09/28/2006   pt denies and doesn't know  . Long term current use of anticoagulant 04/08/2010  . Obese   . OBSTRUCTIVE SLEEP APNEA 01/06/2008   NOT USING    . Right knee DJD 07/09/2010  . S/P CABG x 1 11/26/2011   Right internal mammary artery to right coronary artery  . S/P Maze operation for atrial fibrillation 11/26/2011   complete biatrial lesion  set with clipping of LA appendage    Past Surgical History:  Procedure Laterality Date  . ANTERIOR CERVICAL DECOMP/DISCECTOMY FUSION N/A 06/20/2016   Procedure: Anterior Cervical Discectomy Fusion - Cervical five-Cervical six - Cervical six-Cervical seven - Cervicla seven- Thoracic one;  Surgeon: Earnie Larsson, MD;  Location: [facility];  Service: Neurosurgery;  Laterality: N/A;  . CARDIOVERSION     X4   . CORONARY ARTERY BYPASS GRAFT  11/26/2011   Procedure: CORONARY ARTERY BYPASS GRAFTING (CABG);  Surgeon: Rexene Alberts, MD;  Location: [facility];  Service: Open Heart Surgery;  Laterality: N/A;  coronary artery bypass on pump times one utilizing the right internal mammary artery, transesophageal echocardiogram   . HAND SURGERY Left   . LEFT HEART CATHETERIZATION WITH CORONARY ANGIOGRAM N/A 11/24/2011   Procedure: LEFT HEART CATHETERIZATION WITH CORONARY ANGIOGRAM;  Surgeon: Peter M Martinique, MD;  Location: [facility] CATH LAB;  Service: Cardiovascular;  Laterality: N/A;  . LOOP RECORDER IMPLANT N/A 07/06/2012   Procedure: LOOP RECORDER IMPLANT;  Surgeon: Thompson Grayer, MD;  Location: [facility] CATH LAB;  Service: Cardiovascular;  Laterality: N/A;  . MAZE  11/26/2011   Procedure: MAZE;  Surgeon: Rexene Alberts, MD;  Location: [facility];  Service: Open Heart Surgery;  Laterality:  N/A;  Cryomaze   . Radiofrequency Ablation for atrial fibrillation  12/30/07 and 06/05/09   afib ablation x 2 by JA  . Radiofrequency ablation for atrial flutter  2005   CTI  . TOTAL KNEE ARTHROPLASTY Right 04/05/2019   Procedure: RIGHT TOTAL KNEE ARTHROPLASTY;  Surgeon: Melrose Nakayama, MD;  Location[facility];  Service: Orthopedics;  Laterality: Right;    There were no vitals filed for this visit.  Subjective Assessment - 06/23/19 1048    Subjective  Feel good . Tape eased pain    Currently in Pain?  No/denies                       [facility] Adult PT Treatment/Exercise - 06/23/19 0001      Knee/Hip Exercises: Machines for  Strengthening   Cybex Leg Press  3 plates  [id number]      Knee/Hip Exercises: Standing   Heel Raises  Both;20 reps    Heel Raises Limitations  8# on ankles    Knee Flexion  Right;Left;2 sets;10 reps    Knee Flexion Limitations  8#    Hip Flexion  Right;Left;20 reps    Hip Flexion Limitations  8    Side Lunges  Right;Left;15 reps    Hip Abduction  Right;Left;20 reps    Abduction Limitations  8#    Hip Extension  Right;Left;20 reps    Extension Limitations  8#    Forward Step Up  Right;Hand Hold: 2;Step Height: 8";20 reps    Step Down  Right;Step Height: 4";Hand Hold: 2;20 reps    Functional Squat  15 reps      Knee/Hip Exercises: Prone   Hip Extension  AROM;Left      Manual Therapy   Manual therapy comments  kineseotape cross patella tendon and latearl to patella across joint flexed kneed laid on tape             PT Education - 06/23/19 1146    Education Details  application of kineseotape. issued tape for home    Person(s) Educated  Patient    Methods  Explanation       PT Short Term Goals - 05/23/19 1032      PT SHORT TERM GOAL #1   Title  He will be indpendent with initial hEp    Status  Achieved      PT SHORT TERM GOAL #2   Title  He will be walking out of home with least restrictive device    Status  Achieved      PT SHORT TERM GOAL #3   Title  He will walk full time in home with [facility]    Status  Achieved      PT SHORT TERM GOAL #4   Title  Active RT knee flexion to 100 degrees or better    Status  Achieved        PT Long Term Goals - 06/13/19 1130      PT LONG TERM GOAL #1   Title  He will be indpendent with all HEP issued    Status  On-going      PT LONG TERM GOAL #2   Title  He will walk in home no device and out of home least restricted device    Status  Achieved      PT LONG TERM GOAL #3   Title  He will be independent with all normal home tasks    Status  Achieved  PT LONG TERM GOAL #4   Title  He will return to walking dog 1/4 mile  for exercise    Status  Achieved      PT LONG TERM GOAL #5   Title  He will improve flexion to 120 degrees active and extension to -5-10 degrees to improve gait pattern and  tolerance    Status  Achieved      PT LONG TERM GOAL #6   Title  He will return to work at [employer] - 06/23/19 1051    Clinical Impression Statement  Able to do all exercise and walk at home with min pain with tape on knee. Will progess exercise with tape and continue per plan.    PT Treatment/Interventions  Taping;Manual techniques;Passive range of motion;Patient/family education;Therapeutic exercise;Therapeutic activities;Stair training;Gait training;Vasopneumatic Device;Cryotherapy    PT Next Visit Plan  contniue balance/gait, strength , vaso, manual, STW post knee if needed  progress note next    PT Home Exercise Plan  All HEP reviewed and pt independent (HHPT HEP quad set, SAQ, LAQ, assisted knee flexion in sitting, heel slides SLR, side hip abduction), ITB  Tennis ball STW    Consulted and Agree with Plan of Care  Patient       Patient will benefit from skilled therapeutic intervention in order to improve the following deficits and impairments:  Pain, Difficulty walking, Decreased range of motion, Decreased balance, Decreased strength, Increased edema  Visit Diagnosis: Stiffness of right knee, not elsewhere classified  Difficulty in walking, not elsewhere classified  Localized edema  Status post total right knee replacement  Acute pain of right knee     Problem List Patient Active Problem List   Diagnosis Date Noted  . Primary osteoarthritis of right knee 04/05/2019  . CKD (chronic kidney disease) stage 3, GFR 30-59 ml/min 03/14/2019  . Degenerative joint disease of knee, right 09/27/2018  . Dyspnea 09/06/2018  . Right knee pain 09/06/2018  . Radiculitis of left cervical region 03/05/2018  . Plantar fasciitis, left 08/19/2017  . Chest pain  02/27/2017  . Right ear pain 02/27/2017  . Left groin pain 09/13/2016  . Rib pain on left side 09/13/2016  . Left leg pain 09/13/2016  . Stenosis of cervical spine with myelopathy ([place]) 06/20/2016  . Special screening for malignant neoplasms, colon 04/04/2016  . Cough 01/11/2016  . COPD exacerbation ([place]) 12/22/2015  . Rectal bleeding 08/14/2014  . Intercostal muscle strain 08/07/2014  . Chronic meniscal tear of knee 04/06/2014  . Heel bone fracture 04/06/2014  . Hyperglycemia 04/04/2014  . Recurrent falls 04/04/2014  . Bilateral knee pain 04/04/2014  . Intractable left heel pain 04/04/2014  . Insomnia 04/04/2014  . Hematochezia 04/04/2014  . Bladder neck obstruction 12/19/2013  . Encounter for therapeutic drug monitoring 03/30/2013  . Palpitations 07/06/2012  . Shortness of breath 01/19/2012  . Sick sinus syndrome ([place]) 12/01/2011  . S/P CABG x 1 11/26/2011  . S/P Maze operation for atrial fibrillation 11/26/2011  . CAD (coronary artery disease) 11/25/2011  . Paroxysmal atrial fibrillation ([place]) 11/24/2011  . Chronic combined systolic (congestive) and diastolic (congestive) heart failure ([place]) 11/24/2011  . Gross hematuria 12/12/2010  . Left knee pain 12/12/2010  . Left knee DJD 07/09/2010  . Preventative health care 07/07/2010  . Chronic anticoagulation 04/08/2010  . COPD (chronic obstructive pulmonary disease) ([place]) 10/04/2008  . OBSTRUCTIVE SLEEP APNEA 01/06/2008  .  ALLERGIC RHINITIS 01/14/2007  . Hypothyroidism 09/28/2006  . Hyperlipidemia 09/28/2006  . Anxiety state 09/28/2006  . Essential hypertension 09/28/2006    Darrel Hoover PT 06/23/2019, 11:48 AM  [facility] [address] [place], [place], [address] Phone: [phone]   Fax:  [phone]  Name: Oswaldo Sailor. MRN: [id number] Date of Birth: 11-08-1955

## 2019-06-26 ENCOUNTER — Other Ambulatory Visit: Payer: Self-pay | Admitting: Internal Medicine

## 2019-06-26 DIAGNOSIS — G4733 Obstructive sleep apnea (adult) (pediatric): Secondary | ICD-10-CM | POA: Diagnosis not present

## 2019-06-26 NOTE — Telephone Encounter (Signed)
Done erx 

## 2019-06-28 ENCOUNTER — Ambulatory Visit: Payer: PPO | Attending: Internal Medicine

## 2019-06-28 ENCOUNTER — Other Ambulatory Visit: Payer: Self-pay | Admitting: Internal Medicine

## 2019-06-28 ENCOUNTER — Other Ambulatory Visit: Payer: Self-pay

## 2019-06-28 DIAGNOSIS — M25661 Stiffness of right knee, not elsewhere classified: Secondary | ICD-10-CM

## 2019-06-28 DIAGNOSIS — M6281 Muscle weakness (generalized): Secondary | ICD-10-CM | POA: Diagnosis not present

## 2019-06-28 DIAGNOSIS — R262 Difficulty in walking, not elsewhere classified: Secondary | ICD-10-CM | POA: Insufficient documentation

## 2019-06-28 DIAGNOSIS — Z96651 Presence of right artificial knee joint: Secondary | ICD-10-CM | POA: Diagnosis not present

## 2019-06-28 NOTE — Therapy (Signed)
[place], [place], [address] Phone: [phone]   Fax:  [phone]  Physical Therapy Treatment  Patient Details  Name: Scott [name]. MRN: [id number] Date of Birth: [birth year] Referring Provider (PT): Genevive Bi   Encounter Date: 06/28/2019  PT End of Session - 06/28/19 1331    Visit Number  21    Date for PT Re-Evaluation  07/22/19    Authorization Type  Health team advantage    Authorization Time Period  progress visit 31       KX visit 15    PT Start Time  1210    PT Stop Time  1255    PT Time Calculation (min)  45 min    Activity Tolerance  Patient tolerated treatment well    Behavior During Therapy  WFL for tasks assessed/performed       Past Medical History:  Diagnosis Date  . ALLERGIC RHINITIS 01/14/2007  . ANXIETY 09/28/2006  . Asbestos exposure   . Atrial fibrillation ([place]) 09/28/2006   s/p afib ablation x 2  . CHF (congestive heart failure) ([place])   . CKD (chronic kidney disease), stage III    patient denies  . COPD (chronic obstructive pulmonary disease) ([place])   . Coronary artery disease 11/24/2011  . Diastolic dysfunction 99991111  . Dyspnea    W/ EXERTION   . Dysrhythmia    AFIB     . First degree AV block 10/12/2018   Noted on EKG  . Gallstones   . Hyperglycemia   . HYPERLIPIDEMIA 09/28/2006  . HYPERTENSION 09/28/2006  . HYPOTHYROIDISM, ACQUIRED NEC 09/28/2006   pt denies and doesn't know  . Long term current use of anticoagulant 04/08/2010  . Obese   . OBSTRUCTIVE SLEEP APNEA 01/06/2008   NOT USING    . Right knee DJD 07/09/2010  . S/P CABG x 1 11/26/2011   Right internal mammary artery to right coronary artery  . S/P Maze operation for atrial fibrillation 11/26/2011   complete biatrial lesion set with clipping of LA appendage    Past Surgical History:  Procedure Laterality Date  . ANTERIOR CERVICAL DECOMP/DISCECTOMY FUSION N/A 06/20/2016   Procedure: Anterior  Cervical Discectomy Fusion - Cervical five-Cervical six - Cervical six-Cervical seven - Cervicla seven- Thoracic one;  Surgeon: Earnie Larsson, MD;  Location: [facility];  Service: Neurosurgery;  Laterality: N/A;  . CARDIOVERSION     X4   . CORONARY ARTERY BYPASS GRAFT  11/26/2011   Procedure: CORONARY ARTERY BYPASS GRAFTING (CABG);  Surgeon: Rexene Alberts, MD;  Location: [facility];  Service: Open Heart Surgery;  Laterality: N/A;  coronary artery bypass on pump times one utilizing the right internal mammary artery, transesophageal echocardiogram   . HAND SURGERY Left   . LEFT HEART CATHETERIZATION WITH CORONARY ANGIOGRAM N/A 11/24/2011   Procedure: LEFT HEART CATHETERIZATION WITH CORONARY ANGIOGRAM;  Surgeon: Peter M Martinique, MD;  Location: [facility] CATH LAB;  Service: Cardiovascular;  Laterality: N/A;  . LOOP RECORDER IMPLANT N/A 07/06/2012   Procedure: LOOP RECORDER IMPLANT;  Surgeon: Thompson Grayer, MD;  Location[facility] C[facility] LAB;  Service: Cardiovascular;  Laterality: N/A;  . MAZE  11/26/2011   Procedure: MAZE;  Surgeon: Rexene Alberts, MD;  Location: [facility];  Service: Open Heart Surgery;  Laterality: N/A;  Cryomaze   . Radiofrequency Ablation for atrial fibrillation  12/30/07 and 06/05/09   afib ablation x 2 by JA  . Radiofrequency ablation for atrial flutter  2005  CTI  . TOTAL KNEE ARTHROPLASTY Right 04/05/2019   Procedure: RIGHT TOTAL KNEE ARTHROPLASTY;  Surgeon: Melrose Nakayama, MD;  [facility];  Service: Orthopedics;  Laterality: Right;    There were no vitals filed for this visit.  Subjective Assessment - 06/28/19 1223    Subjective  Found the tape at [facility] and retaped. no pain now Still get tired easy.    Patient is accompained by:  --    Currently in Pain?  No/denies                       [facility] Adult PT Treatment/Exercise - 06/28/19 0001      Knee/Hip Exercises: Aerobic   Nustep  L6 LE m 10 inutes       Knee/Hip Exercises: Machines for Strengthening   Cybex Leg Press  3 plates  x1   4 plates x 10, 5 plates x 10      Knee/Hip Exercises: Standing   Heel Raises Limitations  25#    Knee Flexion  Right;Left;2 sets;10 reps    Knee Flexion Limitations  8#    Hip Flexion  Right;Left;20 reps    Hip Flexion Limitations  8    Hip Abduction  Right;Left;20 reps    Abduction Limitations  8#    Hip Extension  Right;Left;20 reps    Extension Limitations  8#    Other Standing Knee Exercises  stairs forward up and down 6 in step x 20 steps,  backward up 20 steps , sideway RT and LT  20 steps      Knee/Hip Exercises: Seated   Long Arc Quad  Right    Long Arc Quad Weight  7 lbs.    Long Arc Quad Limitations  30               PT Short Term Goals - 05/23/19 1032      PT SHORT TERM GOAL #1   Title  He will be indpendent with initial hEp    Status  Achieved      PT SHORT TERM GOAL #2   Title  He will be walking out of home with least restrictive device    Status  Achieved      PT SHORT TERM GOAL #3   Title  He will walk full time in home with [facility]    Status  Achieved      PT SHORT TERM GOAL #4   Title  Active RT knee flexion to 100 degrees or better    Status  Achieved        PT Long Term Goals - 06/28/19 1335      PT LONG TERM GOAL #1   Title  He will be indpendent with all HEP issued    Status  On-going      PT LONG TERM GOAL #2   Title  He will walk in home no device and out of home least restricted device    Status  Achieved      PT LONG TERM GOAL #3   Title  He will be independent with all normal home tasks    Status  Achieved      PT LONG TERM GOAL #4   Title  He will return to walking dog 1/4 mile for exercise    Status  Achieved      PT LONG TERM GOAL #5   Title  He will improve flexion to 120 degrees active and extension  to -5-10 degrees to improve gait pattern and  tolerance    Status  Achieved      PT LONG TERM GOAL #6   Title  He will return to work at [employer] - 06/28/19 1332     Clinical Impression Statement  No pain and able to retape at home. No isuses though reports easy fatigue with more strenuos activity.    PT Treatment/Interventions  Taping;Manual techniques;Passive range of motion;Patient/family education;Therapeutic exercise;Therapeutic activities;Stair training;Gait training;Vasopneumatic Device;Cryotherapy    PT Next Visit Plan  contniue balance/gait, strength ,    ROM measure    PT Home Exercise Plan  All HEP reviewed and pt independent (HHPT HEP quad set, SAQ, LAQ, assisted knee flexion in sitting, heel slides SLR, side hip abduction), ITB  Tennis ball STW    Consulted and Agree with Plan of Care  Patient       Patient will benefit from skilled therapeutic intervention in order to improve the following deficits and impairments:  Pain, Difficulty walking, Decreased range of motion, Decreased balance, Decreased strength, Increased edema  Visit Diagnosis: Stiffness of right knee, not elsewhere classified  Difficulty in walking, not elsewhere classified  Status post total right knee replacement     Problem List Patient Active Problem List   Diagnosis Date Noted  . Primary osteoarthritis of right knee 04/05/2019  . CKD (chronic kidney disease) stage 3, GFR 30-59 ml/min 03/14/2019  . Degenerative joint disease of knee, right 09/27/2018  . Dyspnea 09/06/2018  . Right knee pain 09/06/2018  . Radiculitis of left cervical region 03/05/2018  . Plantar fasciitis, left 08/19/2017  . Chest pain 02/27/2017  . Right ear pain 02/27/2017  . Left groin pain 09/13/2016  . Rib pain on left side 09/13/2016  . Left leg pain 09/13/2016  . Stenosis of cervical spine with myelopathy ([place]) 06/20/2016  . Special screening for malignant neoplasms, colon 04/04/2016  . Cough 01/11/2016  . COPD exacerbation ([place]) 12/22/2015  . Rectal bleeding 08/14/2014  . Intercostal muscle strain 08/07/2014  . Chronic meniscal tear of knee 04/06/2014  . Heel bone fracture 04/06/2014   . Hyperglycemia 04/04/2014  . Recurrent falls 04/04/2014  . Bilateral knee pain 04/04/2014  . Intractable left heel pain 04/04/2014  . Insomnia 04/04/2014  . Hematochezia 04/04/2014  . Bladder neck obstruction 12/19/2013  . Encounter for therapeutic drug monitoring 03/30/2013  . Palpitations 07/06/2012  . Shortness of breath 01/19/2012  . Sick sinus syndrome ([place]) 12/01/2011  . S/P CABG x 1 11/26/2011  . S/P Maze operation for atrial fibrillation 11/26/2011  . CAD (coronary artery disease) 11/25/2011  . Paroxysmal atrial fibrillation ([place]) 11/24/2011  . Chronic combined systolic (congestive) and diastolic (congestive) heart failure ([place]) 11/24/2011  . Gross hematuria 12/12/2010  . Left knee pain 12/12/2010  . Left knee DJD 07/09/2010  . Preventative health care 07/07/2010  . Chronic anticoagulation 04/08/2010  . COPD (chronic obstructive pulmonary disease) ([place]) 10/04/2008  . OBSTRUCTIVE SLEEP APNEA 01/06/2008  . ALLERGIC RHINITIS 01/14/2007  . Hypothyroidism 09/28/2006  . Hyperlipidemia 09/28/2006  . Anxiety state 09/28/2006  . Essential hypertension 09/28/2006    Darrel Hoover  PT 06/28/2019, 1:41 PM  [facility] [address] [place], [place], [address] Phone: [phone]   Fax:  [phone]  Name: Aleph Sparhawk. MRN: [id number] Date of Birth: Sep 22, 1955

## 2019-06-30 ENCOUNTER — Other Ambulatory Visit: Payer: Self-pay

## 2019-06-30 ENCOUNTER — Ambulatory Visit: Payer: PPO

## 2019-06-30 DIAGNOSIS — M6281 Muscle weakness (generalized): Secondary | ICD-10-CM

## 2019-06-30 DIAGNOSIS — R262 Difficulty in walking, not elsewhere classified: Secondary | ICD-10-CM

## 2019-06-30 DIAGNOSIS — Z96651 Presence of right artificial knee joint: Secondary | ICD-10-CM

## 2019-06-30 DIAGNOSIS — M25661 Stiffness of right knee, not elsewhere classified: Secondary | ICD-10-CM | POA: Diagnosis not present

## 2019-06-30 NOTE — Therapy (Signed)
[facility] [place], [place], [address] Phone: [phone]   Fax:  [phone]  Physical Therapy Treatment  Patient Details  Name: Scott [name]. MRN: [id number] Date of Birth: 08-Jul-1955 Referring Provider (PT): Genevive Bi   Encounter Date: 06/30/2019  PT End of Session - 06/30/19 1354    Visit Number  22    Number of Visits  28    Date for PT Re-Evaluation  07/22/19    Authorization Type  Health team advantage    Authorization Time Period  progress visit 47       KX visit 15    PT Start Time  0154    PT Stop Time  0240    PT Time Calculation (min)  46 min    Activity Tolerance  Patient tolerated treatment well    Behavior During Therapy  [facility] for tasks assessed/performed       Past Medical History:  Diagnosis Date  . ALLERGIC RHINITIS 01/14/2007  . ANXIETY 09/28/2006  . Asbestos exposure   . Atrial fibrillation ([place]) 09/28/2006   s/p afib ablation x 2  . CHF (congestive heart failure) ([place])   . CKD (chronic kidney disease), stage III    patient denies  . COPD (chronic obstructive pulmonary disease) ([place])   . Coronary artery disease 11/24/2011  . Diastolic dysfunction 99991111  . Dyspnea    W/ EXERTION   . Dysrhythmia    AFIB     . First degree AV block 10/12/2018   Noted on EKG  . Gallstones   . Hyperglycemia   . HYPERLIPIDEMIA 09/28/2006  . HYPERTENSION 09/28/2006  . HYPOTHYROIDISM, ACQUIRED NEC 09/28/2006   pt denies and doesn't know  . Long term current use of anticoagulant 04/08/2010  . Obese   . OBSTRUCTIVE SLEEP APNEA 01/06/2008   NOT USING    . Right knee DJD 07/09/2010  . S/P CABG x 1 11/26/2011   Right internal mammary artery to right coronary artery  . S/P Maze operation for atrial fibrillation 11/26/2011   complete biatrial lesion set with clipping of LA appendage    Past Surgical History:  Procedure Laterality Date  . ANTERIOR CERVICAL DECOMP/DISCECTOMY FUSION N/A 06/20/2016   Procedure: Anterior Cervical Discectomy Fusion - Cervical five-Cervical six - Cervical six-Cervical seven - Cervicla seven- Thoracic one;  Surgeon: Earnie Larsson, MD;  Location: [place];  Service: Neurosurgery;  Laterality: N/A;  . CARDIOVERSION     X4   . CORONARY ARTERY BYPASS GRAFT  11/26/2011   Procedure: CORONARY ARTERY BYPASS GRAFTING (CABG);  Surgeon: Rexene Alberts, MD;  Location: [place];  Service: Open Heart Surgery;  Laterality: N/A;  coronary artery bypass on pump times one utilizing the right internal mammary artery, transesophageal echocardiogram   . HAND SURGERY Left   . LEFT HEART CATHETERIZATION WITH CORONARY ANGIOGRAM N/A 11/24/2011   Procedure: LEFT HEART CATHETERIZATION WITH CORONARY ANGIOGRAM;  Surgeon: Peter M Martinique, MD;  Location: [facility] CATH LAB;  Service: Cardiovascular;  Laterality: N/A;  . LOOP RECORDER IMPLANT N/A 07/06/2012   Procedure: LOOP RECORDER IMPLANT;  Surgeon: Thompson Grayer, MD;  Location[facility] CATH LAB;  Service: Cardiovascular;  Laterality: N/A;  . MAZE  11/26/2011   Procedure: MAZE;  Surgeon: Rexene Alberts, MD;  Location: [place];  Service: Open Heart Surgery;  Laterality: N/A;  Cryomaze   . Radiofrequency Ablation for atrial fibrillation  12/30/07 and 06/05/09   afib ablation x 2 by JA  . Radiofrequency  ablation for atrial flutter  2005   CTI  . TOTAL KNEE ARTHROPLASTY Right 04/05/2019   Procedure: RIGHT TOTAL KNEE ARTHROPLASTY;  Surgeon: Melrose Nakayama, MD;  [facility];  Service: Orthopedics;  Laterality: Right;    There were no vitals filed for this visit.  Subjective Assessment - 06/30/19 1444    Subjective  No pain today . Still tired post walking and actrivity like mowing grass.         [facility] PT Assessment - 06/30/19 0001      AROM   Right Knee Extension  -15    Right Knee Flexion  127      PROM   Right Knee Extension  -10    Right Knee Flexion  130                   [facility] Adult PT Treatment/Exercise - 06/30/19 0001      Neuro  Re-ed    Neuro Re-ed Details   single leg TL ball toss to rebounder with LT toe on ground,  single leg hip hinge sliding Lt foot back on wash cloth. no UE support.       Knee/Hip Exercises: Aerobic   Nustep  L6 LE m 7 m inutes       Knee/Hip Exercises: Machines for Strengthening   Cybex Leg Press     4 plates x 10, 5 plates x 10      Knee/Hip Exercises: Standing   Heel Raises Limitations  25 reps    Side Lunges  Right;Left;15 reps    Side Lunges Limitations  green band    Hip Abduction  Right;Left;15 reps    Abduction Limitations  10#    Hip Extension  Right;Left;15 reps    Extension Limitations  10#    Functional Squat  --    Functional Squat Limitations  3x 5 with 10# kettle bell      Knee/Hip Exercises: Seated   Long Arc Quad  Right;20 reps    Long Arc Quad Weight  10 lbs.    Long Arc Quad Limitations  --               PT Short Term Goals - 05/23/19 1032      PT SHORT TERM GOAL #1   Title  He will be indpendent with initial hEp    Status  Achieved      PT SHORT TERM GOAL #2   Title  He will be walking out of home with least restrictive device    Status  Achieved      PT SHORT TERM GOAL #3   Title  He will walk full time in home with [facility]    Status  Achieved      PT SHORT TERM GOAL #4   Title  Active RT knee flexion to 100 degrees or better    Status  Achieved        PT Long Term Goals - 06/28/19 1335      PT LONG TERM GOAL #1   Title  He will be indpendent with all HEP issued    Status  On-going      PT LONG TERM GOAL #2   Title  He will walk in home no device and out of home least restricted device    Status  Achieved      PT LONG TERM GOAL #3   Title  He will be independent with all normal home tasks  Status  Achieved      PT LONG TERM GOAL #4   Title  He will return to walking dog 1/4 mile for exercise    Status  Achieved      PT LONG TERM GOAL #5   Title  He will improve flexion to 120 degrees active and extension to -5-10 degrees to  improve gait pattern and  tolerance    Status  Achieved      PT LONG TERM GOAL #6   Title  He will return to work at [employer] - 06/30/19 [place] with some mild soreness but no pain.   No new complaints.  functioning well at home though again sore with proglonged walking.    PT Treatment/Interventions  Taping;Manual techniques;Passive range of motion;Patient/family education;Therapeutic exercise;Therapeutic activities;Stair training;Gait training;Vasopneumatic Device;Cryotherapy    PT Next Visit Plan  contniue balance/gait, strength ,    ROM measure    PT Home Exercise Plan  All HEP reviewed and pt independent (HHPT HEP quad set, SAQ, LAQ, assisted knee flexion in sitting, heel slides SLR, side hip abduction), ITB  Tennis ball STW    Consulted and Agree with Plan of Care  Patient       Patient will benefit from skilled therapeutic intervention in order to improve the following deficits and impairments:  Pain, Difficulty walking, Decreased range of motion, Decreased balance, Decreased strength, Increased edema  Visit Diagnosis: Status post total right knee replacement  Difficulty in walking, not elsewhere classified  Muscle weakness (generalized)     Problem List Patient Active Problem List   Diagnosis Date Noted  . Primary osteoarthritis of right knee 04/05/2019  . CKD (chronic kidney disease) stage 3, GFR 30-59 ml/min 03/14/2019  . Degenerative joint disease of knee, right 09/27/2018  . Dyspnea 09/06/2018  . Right knee pain 09/06/2018  . Radiculitis of left cervical region 03/05/2018  . Plantar fasciitis, left 08/19/2017  . Chest pain 02/27/2017  . Right ear pain 02/27/2017  . Left groin pain 09/13/2016  . Rib pain on left side 09/13/2016  . Left leg pain 09/13/2016  . Stenosis of cervical spine with myelopathy ([place]) 06/20/2016  . Special screening for malignant neoplasms, colon 04/04/2016   . Cough 01/11/2016  . COPD exacerbation ([place]) 12/22/2015  . Rectal bleeding 08/14/2014  . Intercostal muscle strain 08/07/2014  . Chronic meniscal tear of knee 04/06/2014  . Heel bone fracture 04/06/2014  . Hyperglycemia 04/04/2014  . Recurrent falls 04/04/2014  . Bilateral knee pain 04/04/2014  . Intractable left heel pain 04/04/2014  . Insomnia 04/04/2014  . Hematochezia 04/04/2014  . Bladder neck obstruction 12/19/2013  . Encounter for therapeutic drug monitoring 03/30/2013  . Palpitations 07/06/2012  . Shortness of breath 01/19/2012  . Sick sinus syndrome ([place]) 12/01/2011  . S/P CABG x 1 11/26/2011  . S/P Maze operation for atrial fibrillation 11/26/2011  . CAD (coronary artery disease) 11/25/2011  . Paroxysmal atrial fibrillation ([place]) 11/24/2011  . Chronic combined systolic (congestive) and diastolic (congestive) heart failure ([place]) 11/24/2011  . Gross hematuria 12/12/2010  . Left knee pain 12/12/2010  . Left knee DJD 07/09/2010  . Preventative health care 07/07/2010  . Chronic anticoagulation 04/08/2010  . COPD (chronic obstructive pulmonary disease) ([place]) 10/04/2008  . OBSTRUCTIVE SLEEP APNEA 01/06/2008  . ALLERGIC RHINITIS 01/14/2007  . Hypothyroidism 09/28/2006  . Hyperlipidemia 09/28/2006  .  Anxiety state 09/28/2006  . Essential hypertension 09/28/2006    Darrel Hoover PT 06/30/2019, 2:45 PM  [facility] [address] [place], [place], [address] Phone: [phone]   Fax:  [phone]  Name: Scott [name]. MRN: [id number] Date of Birth: 28-May-1955

## 2019-07-05 ENCOUNTER — Ambulatory Visit: Payer: PPO

## 2019-07-05 ENCOUNTER — Other Ambulatory Visit: Payer: Self-pay

## 2019-07-05 DIAGNOSIS — Z96651 Presence of right artificial knee joint: Secondary | ICD-10-CM

## 2019-07-05 DIAGNOSIS — M6281 Muscle weakness (generalized): Secondary | ICD-10-CM

## 2019-07-05 DIAGNOSIS — M25661 Stiffness of right knee, not elsewhere classified: Secondary | ICD-10-CM | POA: Diagnosis not present

## 2019-07-05 DIAGNOSIS — R262 Difficulty in walking, not elsewhere classified: Secondary | ICD-10-CM

## 2019-07-05 NOTE — Therapy (Signed)
[facility] [place], [place], [address] Phone: [phone]   Fax:  [phone]  Physical Therapy Treatment  Patient Details  Name: Scott [name]. MRN: [id number] Date of Birth: Jul 08, 1955 Referring Provider (PT): Genevive Bi   Encounter Date: 07/05/2019  PT End of Session - 07/05/19 1038    Visit Number  23    Number of Visits  28    Date for PT Re-Evaluation  07/22/19    Authorization Type  Health team advantage    PT Start Time  1036    PT Stop Time  1122    PT Time Calculation (min)  46 min    Activity Tolerance  Patient tolerated treatment well    Behavior During Therapy  [facility] for tasks assessed/performed       Past Medical History:  Diagnosis Date  . ALLERGIC RHINITIS 01/14/2007  . ANXIETY 09/28/2006  . Asbestos exposure   . Atrial fibrillation ([place]) 09/28/2006   s/p afib ablation x 2  . CHF (congestive heart failure) ([place])   . CKD (chronic kidney disease), stage III    patient denies  . COPD (chronic obstructive pulmonary disease) ([place])   . Coronary artery disease 11/24/2011  . Diastolic dysfunction 99991111  . Dyspnea    W/ EXERTION   . Dysrhythmia    AFIB     . First degree AV block 10/12/2018   Noted on EKG  . Gallstones   . Hyperglycemia   . HYPERLIPIDEMIA 09/28/2006  . HYPERTENSION 09/28/2006  . HYPOTHYROIDISM, ACQUIRED NEC 09/28/2006   pt denies and doesn't know  . Long term current use of anticoagulant 04/08/2010  . Obese   . OBSTRUCTIVE SLEEP APNEA 01/06/2008   NOT USING    . Right knee DJD 07/09/2010  . S/P CABG x 1 11/26/2011   Right internal mammary artery to right coronary artery  . S/P Maze operation for atrial fibrillation 11/26/2011   complete biatrial lesion set with clipping of LA appendage    Past Surgical History:  Procedure Laterality Date  . ANTERIOR CERVICAL DECOMP/DISCECTOMY FUSION N/A 06/20/2016   Procedure: Anterior Cervical Discectomy Fusion - Cervical  five-Cervical six - Cervical six-Cervical seven - Cervicla seven- Thoracic one;  Surgeon: Earnie Larsson, MD;  Location: [place];  Service: Neurosurgery;  Laterality: N/A;  . CARDIOVERSION     X4   . CORONARY ARTERY BYPASS GRAFT  11/26/2011   Procedure: CORONARY ARTERY BYPASS GRAFTING (CABG);  Surgeon: Rexene Alberts, MD;  Location: [place];  Service: Open Heart Surgery;  Laterality: N/A;  coronary artery bypass on pump times one utilizing the right internal mammary artery, transesophageal echocardiogram   . HAND SURGERY Left   . LEFT HEART CATHETERIZATION WITH CORONARY ANGIOGRAM N/A 11/24/2011   Procedure: LEFT HEART CATHETERIZATION WITH CORONARY ANGIOGRAM;  Surgeon: Peter M Martinique, MD;  Location: [facility] CATH LAB;  Service: Cardiovascular;  Laterality: N/A;  . LOOP RECORDER IMPLANT N/A 07/06/2012   Procedure: LOOP RECORDER IMPLANT;  Surgeon: Thompson Grayer, MD;  Location: [facility] CATH LAB;  Service: Cardiovascular;  Laterality: N/A;  . MAZE  11/26/2011   Procedure: MAZE;  Surgeon: Rexene Alberts, MD;  Location: [place];  Service: Open Heart Surgery;  Laterality: N/A;  Cryomaze   . Radiofrequency Ablation for atrial fibrillation  12/30/07 and 06/05/09   afib ablation x 2 by JA  . Radiofrequency ablation for atrial flutter  2005   CTI  . TOTAL KNEE ARTHROPLASTY Right 04/05/2019  Procedure: RIGHT TOTAL KNEE ARTHROPLASTY;  Surgeon: Melrose Nakayama, MD;  [facility];  Service: Orthopedics;  Laterality: Right;    There were no vitals filed for this visit.  Subjective Assessment - 07/05/19 1038    Subjective  No pain . no new problems. Walked the other day and felt for 1/2 of walk I was walking nomal then bagan to favor RT leg.    Currently in Pain?  No/denies                       [facility] Adult PT Treatment/Exercise - 07/05/19 0001      Knee/Hip Exercises: Aerobic   Nustep  L6 LE m 7 m inutes       Knee/Hip Exercises: Machines for Strengthening   Cybex Leg Press  5 plates  [id number]    Other Machine   freemotion single arm pull and walk backward and forward x 10 RT/Lt arm 20#       Knee/Hip Exercises: Standing   Heel Raises Limitations  25 reps    Hip Abduction  Right;Left;15 reps    Abduction Limitations  10#    Hip Extension  Right;Left;15 reps    Extension Limitations  10#    Forward Step Up  Right;20 reps;Hand Hold: 2    Forward Step Up Limitations  11 inche step    Functional Squat Limitations  2x10 with 25# kettle bell      Knee/Hip Exercises: Seated   Long Arc [employer] reps;Left    Long Arc Quad Weight  10 lbs.               PT Short Term Goals - 05/23/19 1032      PT SHORT TERM GOAL #1   Title  He will be indpendent with initial hEp    Status  Achieved      PT SHORT TERM GOAL #2   Title  He will be walking out of home with least restrictive device    Status  Achieved      PT SHORT TERM GOAL #3   Title  He will walk full time in home with [facility]    Status  Achieved      PT SHORT TERM GOAL #4   Title  Active RT knee flexion to 100 degrees or better    Status  Achieved        PT Long Term Goals - 07/05/19 1137      PT LONG TERM GOAL #1   Title  He will be indpendent with all HEP issued    Status  On-going      PT LONG TERM GOAL #2   Title  He will walk in home no device and out of home least restricted device    Status  Achieved      PT LONG TERM GOAL #3   Title  He will be independent with all normal home tasks    Status  Achieved      PT LONG TERM GOAL #4   Title  He will return to walking dog 1/4 mile for exercise    Status  Achieved      PT LONG TERM GOAL #5   Title  He will improve flexion to 120 degrees active and extension to -5-10 degrees to improve gait pattern and  tolerance    Status  Achieved      PT LONG TERM GOAL #6   Title  He will  return to work at [employer] - 07/05/19 1039    Clinical Impression Statement  Gsit pattern appears more normal. flexion ROM at 130 degrees  so  this is good there is no loss of ROM.  He does get fatigued with exercise but with brief rest he is able to continue.  Will work to end of POC then probable discharge    PT Treatment/Interventions  Taping;Manual techniques;Passive range of motion;Patient/family education;Therapeutic exercise;Therapeutic activities;Stair training;Gait training;Vasopneumatic Device;Cryotherapy    PT Next Visit Plan  contniue balance/gait, strength ,     FOTO    PT Home Exercise Plan  All HEP reviewed and pt independent (HHPT HEP quad set, SAQ, LAQ, assisted knee flexion in sitting, heel slides SLR, side hip abduction), ITB  Tennis ball STW    Consulted and Agree with Plan of Care  Patient       Patient will benefit from skilled therapeutic intervention in order to improve the following deficits and impairments:  Pain, Difficulty walking, Decreased range of motion, Decreased balance, Decreased strength, Increased edema  Visit Diagnosis: Status post total right knee replacement  Difficulty in walking, not elsewhere classified  Muscle weakness (generalized)     Problem List Patient Active Problem List   Diagnosis Date Noted  . Primary osteoarthritis of right knee 04/05/2019  . CKD (chronic kidney disease) stage 3, GFR 30-59 ml/min 03/14/2019  . Degenerative joint disease of knee, right 09/27/2018  . Dyspnea 09/06/2018  . Right knee pain 09/06/2018  . Radiculitis of left cervical region 03/05/2018  . Plantar fasciitis, left 08/19/2017  . Chest pain 02/27/2017  . Right ear pain 02/27/2017  . Left groin pain 09/13/2016  . Rib pain on left side 09/13/2016  . Left leg pain 09/13/2016  . Stenosis of cervical spine with myelopathy ([place]) 06/20/2016  . Special screening for malignant neoplasms, colon 04/04/2016  . Cough 01/11/2016  . COPD exacerbation ([place]) 12/22/2015  . Rectal bleeding 08/14/2014  . Intercostal muscle strain 08/07/2014  . Chronic meniscal tear of knee 04/06/2014  . Heel bone fracture  04/06/2014  . Hyperglycemia 04/04/2014  . Recurrent falls 04/04/2014  . Bilateral knee pain 04/04/2014  . Intractable left heel pain 04/04/2014  . Insomnia 04/04/2014  . Hematochezia 04/04/2014  . Bladder neck obstruction 12/19/2013  . Encounter for therapeutic drug monitoring 03/30/2013  . Palpitations 07/06/2012  . Shortness of breath 01/19/2012  . Sick sinus syndrome ([place]) 12/01/2011  . S/P CABG x 1 11/26/2011  . S/P Maze operation for atrial fibrillation 11/26/2011  . CAD (coronary artery disease) 11/25/2011  . Paroxysmal atrial fibrillation ([place]) 11/24/2011  . Chronic combined systolic (congestive) and diastolic (congestive) heart failure ([place]) 11/24/2011  . Gross hematuria 12/12/2010  . Left knee pain 12/12/2010  . Left knee DJD 07/09/2010  . Preventative health care 07/07/2010  . Chronic anticoagulation 04/08/2010  . COPD (chronic obstructive pulmonary disease) ([place]) 10/04/2008  . OBSTRUCTIVE SLEEP APNEA 01/06/2008  . ALLERGIC RHINITIS 01/14/2007  . Hypothyroidism 09/28/2006  . Hyperlipidemia 09/28/2006  . Anxiety state 09/28/2006  . Essential hypertension 09/28/2006    Darrel Hoover  PT 07/05/2019, 11:40 AM  [facility] [address] [place], [place], [address] Phone: [phone]   Fax:  [phone]  Name: Scott [name]. MRN: [id number] Date of Birth: Apr 16, 1955

## 2019-07-06 ENCOUNTER — Other Ambulatory Visit: Payer: Self-pay | Admitting: Internal Medicine

## 2019-07-06 NOTE — Telephone Encounter (Signed)
Please refill as per office routine med refill policy (all routine meds refilled for 3 mo or monthly per pt preference up to one year from last visit, then month to month grace period for 3 mo, then further med refills will have to be denied)  

## 2019-07-07 ENCOUNTER — Ambulatory Visit: Payer: PPO

## 2019-07-07 ENCOUNTER — Other Ambulatory Visit: Payer: Self-pay

## 2019-07-07 DIAGNOSIS — M25661 Stiffness of right knee, not elsewhere classified: Secondary | ICD-10-CM | POA: Diagnosis not present

## 2019-07-07 DIAGNOSIS — R262 Difficulty in walking, not elsewhere classified: Secondary | ICD-10-CM

## 2019-07-07 DIAGNOSIS — M6281 Muscle weakness (generalized): Secondary | ICD-10-CM

## 2019-07-07 DIAGNOSIS — Z96651 Presence of right artificial knee joint: Secondary | ICD-10-CM

## 2019-07-07 NOTE — Therapy (Signed)
[place], [place], [address] Phone: [phone]   Fax:  [phone]  Physical Therapy Treatment  Patient Details  Name: Scott [name]. MRN: [id number] Date of Birth: [birth year] Referring Provider (PT): Genevive Bi   Encounter Date: 07/07/2019  PT End of Session - 07/07/19 1406    Visit Number  24    Number of Visits  28    Date for PT Re-Evaluation  07/22/19    Authorization Type  Health team advantage    Authorization Time Period  progress visit 33       KX visit 15    PT Start Time  0204    PT Stop Time  0245    PT Time Calculation (min)  41 min    Activity Tolerance  Patient tolerated treatment well    Behavior During Therapy  [facility] for tasks assessed/performed       Past Medical History:  Diagnosis Date  . ALLERGIC RHINITIS 01/14/2007  . ANXIETY 09/28/2006  . Asbestos exposure   . Atrial fibrillation ([place]) 09/28/2006   s/p afib ablation x 2  . CHF (congestive heart failure) ([place])   . CKD (chronic kidney disease), stage III    patient denies  . COPD (chronic obstructive pulmonary disease) ([place])   . Coronary artery disease 11/24/2011  . Diastolic dysfunction 08/25/2195  . Dyspnea    W/ EXERTION   . Dysrhythmia    AFIB     . First degree AV block 10/12/2018   Noted on EKG  . Gallstones   . Hyperglycemia   . HYPERLIPIDEMIA 09/28/2006  . HYPERTENSION 09/28/2006  . HYPOTHYROIDISM, ACQUIRED NEC 09/28/2006   pt denies and doesn't know  . Long term current use of anticoagulant 04/08/2010  . Obese   . OBSTRUCTIVE SLEEP APNEA 01/06/2008   NOT USING    . Right knee DJD 07/09/2010  . S/P CABG x 1 11/26/2011   Right internal mammary artery to right coronary artery  . S/P Maze operation for atrial fibrillation 11/26/2011   complete biatrial lesion set with clipping of LA appendage    Past Surgical History:  Procedure Laterality Date  . ANTERIOR CERVICAL DECOMP/DISCECTOMY FUSION N/A 06/20/2016   Procedure: Anterior Cervical Discectomy Fusion - Cervical five-Cervical six - Cervical six-Cervical seven - Cervicla seven- Thoracic one;  Surgeon: Earnie Larsson, MD;  Location: [place];  Service: Neurosurgery;  Laterality: N/A;  . CARDIOVERSION     X4   . CORONARY ARTERY BYPASS GRAFT  11/26/2011   Procedure: CORONARY ARTERY BYPASS GRAFTING (CABG);  Surgeon: Rexene Alberts, MD;  Location: [place];  Service: Open Heart Surgery;  Laterality: N/A;  coronary artery bypass on pump times one utilizing the right internal mammary artery, transesophageal echocardiogram   . HAND SURGERY Left   . LEFT HEART CATHETERIZATION WITH CORONARY ANGIOGRAM N/A 11/24/2011   Procedure: LEFT HEART CATHETERIZATION WITH CORONARY ANGIOGRAM;  Surgeon: Peter M Martinique, MD;  Location[facility] LAB;  Service: Cardiovascular;  Laterality: N/A;  . LOOP RECORDER IMPLANT N/A 07/06/2012   Procedure: LOOP RECORDER IMPLANT;  Surgeon: Thompson Grayer, MD;  Location: [facility] CATH LAB;  Service: Cardiovascular;  Laterality: N/A;  . MAZE  11/26/2011   Procedure: MAZE;  Surgeon: Rexene Alberts, MD;  Location: [place];  Service: Open Heart Surgery;  Laterality: N/A;  Cryomaze   . Radiofrequency Ablation for atrial fibrillation  12/30/07 and 06/05/09   afib ablation x 2 by JA  . Radiofrequency  ablation for atrial flutter  2005   CTI  . TOTAL KNEE ARTHROPLASTY Right 04/05/2019   Procedure: RIGHT TOTAL KNEE ARTHROPLASTY;  Surgeon: Melrose Nakayama, MD;  [facility];  Service: Orthopedics;  Laterality: Right;    There were no vitals filed for this visit.  Subjective Assessment - 07/07/19 1406    Subjective  no pain .  When I get pain tends to be on the lateral aspect    Currently in Pain?  No/denies         [facility] PT Assessment - 07/07/19 0001      Observation/Other Assessments   Focus on Therapeutic Outcomes (FOTO)   39% expected goal met                    [facility] Adult PT Treatment/Exercise - 07/07/19 0001      Knee/Hip Exercises:  Aerobic   Nustep  L6 LE m 7 m inutes       Knee/Hip Exercises: Machines for Strengthening   Cybex Leg Press  5 plates  6H20    Other Machine  freemotion single arm pull and walk backward and forward x 10 RT/Lt arm 27#       Knee/Hip Exercises: Standing   Heel Raises Limitations  25 reps    Knee Flexion  Right;Left;20 reps    Knee Flexion Limitations  10#    Hip Abduction  Right;Left;15 reps    Abduction Limitations  10#    Hip Extension  Right;Left;15 reps    Extension Limitations  10#    Forward Step Up  Right;20 reps;Hand Hold: 2    Functional Squat  20 reps               PT Short Term Goals - 05/23/19 1032      PT SHORT TERM GOAL #1   Title  He will be indpendent with initial hEp    Status  Achieved      PT SHORT TERM GOAL #2   Title  He will be walking out of home with least restrictive device    Status  Achieved      PT SHORT TERM GOAL #3   Title  He will walk full time in home with [facility]    Status  Achieved      PT SHORT TERM GOAL #4   Title  Active RT knee flexion to 100 degrees or better    Status  Achieved        PT Long Term Goals - 07/05/19 1137      PT LONG TERM GOAL #1   Title  He will be indpendent with all HEP issued    Status  On-going      PT LONG TERM GOAL #2   Title  He will walk in home no device and out of home least restricted device    Status  Achieved      PT LONG TERM GOAL #3   Title  He will be independent with all normal home tasks    Status  Achieved      PT LONG TERM GOAL #4   Title  He will return to walking dog 1/4 mile for exercise    Status  Achieved      PT LONG TERM GOAL #5   Title  He will improve flexion to 120 degrees active and extension to -5-10 degrees to improve gait pattern and  tolerance    Status  Achieved  PT LONG TERM GOAL #6   Title  He will return to work at [employer] - 07/07/19 [place] expectation met.   ROM  good,   Functionally doingg well . Continued ain lateral RT knee . Tape helps and suggested icing . Will discharge after next 2 visits    PT Treatment/Interventions  Taping;Manual techniques;Passive range of motion;Patient/family education;Therapeutic exercise;Therapeutic activities;Stair training;Gait training;Vasopneumatic Device;Cryotherapy    PT Next Visit Plan  contniue balance/gait, strength ,     FOTO    PT Home Exercise Plan  All HEP reviewed and pt independent (HHPT HEP quad set, SAQ, LAQ, assisted knee flexion in sitting, heel slides SLR, side hip abduction), ITB  Tennis ball STW    Consulted and Agree with Plan of Care  Patient       Patient will benefit from skilled therapeutic intervention in order to improve the following deficits and impairments:  Pain, Difficulty walking, Decreased range of motion, Decreased balance, Decreased strength, Increased edema  Visit Diagnosis: Status post total right knee replacement  Difficulty in walking, not elsewhere classified  Muscle weakness (generalized)     Problem List Patient Active Problem List   Diagnosis Date Noted  . Primary osteoarthritis of right knee 04/05/2019  . CKD (chronic kidney disease) stage 3, GFR 30-59 ml/min 03/14/2019  . Degenerative joint disease of knee, right 09/27/2018  . Dyspnea 09/06/2018  . Right knee pain 09/06/2018  . Radiculitis of left cervical region 03/05/2018  . Plantar fasciitis, left 08/19/2017  . Chest pain 02/27/2017  . Right ear pain 02/27/2017  . Left groin pain 09/13/2016  . Rib pain on left side 09/13/2016  . Left leg pain 09/13/2016  . Stenosis of cervical spine with myelopathy ([place]) 06/20/2016  . Special screening for malignant neoplasms, colon 04/04/2016  . Cough 01/11/2016  . COPD exacerbation ([place]) 12/22/2015  . Rectal bleeding 08/14/2014  . Intercostal muscle strain 08/07/2014  . Chronic meniscal tear of knee 04/06/2014  . Heel bone fracture 04/06/2014  . Hyperglycemia  04/04/2014  . Recurrent falls 04/04/2014  . Bilateral knee pain 04/04/2014  . Intractable left heel pain 04/04/2014  . Insomnia 04/04/2014  . Hematochezia 04/04/2014  . Bladder neck obstruction 12/19/2013  . Encounter for therapeutic drug monitoring 03/30/2013  . Palpitations 07/06/2012  . Shortness of breath 01/19/2012  . Sick sinus syndrome ([place]) 12/01/2011  . S/P CABG x 1 11/26/2011  . S/P Maze operation for atrial fibrillation 11/26/2011  . CAD (coronary artery disease) 11/25/2011  . Paroxysmal atrial fibrillation ([place]) 11/24/2011  . Chronic combined systolic (congestive) and diastolic (congestive) heart failure ([place]) 11/24/2011  . Gross hematuria 12/12/2010  . Left knee pain 12/12/2010  . Left knee DJD 07/09/2010  . Preventative health care 07/07/2010  . Chronic anticoagulation 04/08/2010  . COPD (chronic obstructive pulmonary disease) ([place]) 10/04/2008  . OBSTRUCTIVE SLEEP APNEA 01/06/2008  . ALLERGIC RHINITIS 01/14/2007  . Hypothyroidism 09/28/2006  . Hyperlipidemia 09/28/2006  . Anxiety state 09/28/2006  . Essential hypertension 09/28/2006    Darrel Hoover PT 07/07/2019, 3:16 PM  [facility] [address] [place], [place], [address] Phone: [phone]   Fax:  [phone]  Name: Scott [name]. MRN: [id number] Date of Birth: Sep 27, 1955

## 2019-07-12 ENCOUNTER — Other Ambulatory Visit: Payer: Self-pay

## 2019-07-12 ENCOUNTER — Ambulatory Visit: Payer: PPO

## 2019-07-12 DIAGNOSIS — Z96651 Presence of right artificial knee joint: Secondary | ICD-10-CM

## 2019-07-12 DIAGNOSIS — R262 Difficulty in walking, not elsewhere classified: Secondary | ICD-10-CM

## 2019-07-12 DIAGNOSIS — M25661 Stiffness of right knee, not elsewhere classified: Secondary | ICD-10-CM | POA: Diagnosis not present

## 2019-07-12 NOTE — Therapy (Addendum)
[place], [place], [address] Phone: [phone]   Fax:  [phone]  Physical Therapy Treatment  Patient Details  Name: Scott [name]. MRN: [id number] Date of Birth: 20-Mar-1955 Referring Provider (PT): Genevive Bi   Encounter Date: 07/12/2019  PT End of Session - 07/12/19 1047    Visit Number  25    Number of Visits  28    Authorization Type  Health team advantage    Authorization Time Period  progress visit 30       KX visit 15    PT Start Time  1045    PT Stop Time  1130    PT Time Calculation (min)  45 min    Activity Tolerance  Patient tolerated treatment well    Behavior During Therapy  WFL for tasks assessed/performed       Past Medical History:  Diagnosis Date  . ALLERGIC RHINITIS 01/14/2007  . ANXIETY 09/28/2006  . Asbestos exposure   . Atrial fibrillation ([place]) 09/28/2006   s/p afib ablation x 2  . CHF (congestive heart failure) ([place])   . CKD (chronic kidney disease), stage III    patient denies  . COPD (chronic obstructive pulmonary disease) ([place])   . Coronary artery disease 11/24/2011  . Diastolic dysfunction [id number]  . Dyspnea    W/ EXERTION   . Dysrhythmia    AFIB     . First degree AV block 10/12/2018   Noted on EKG  . Gallstones   . Hyperglycemia   . HYPERLIPIDEMIA 09/28/2006  . HYPERTENSION 09/28/2006  . HYPOTHYROIDISM, ACQUIRED NEC 09/28/2006   pt denies and doesn't know  . Long term current use of anticoagulant 04/08/2010  . Obese   . OBSTRUCTIVE SLEEP APNEA 01/06/2008   NOT USING    . Right knee DJD 07/09/2010  . S/P CABG x 1 11/26/2011   Right internal mammary artery to right coronary artery  . S/P Maze operation for atrial fibrillation 11/26/2011   complete biatrial lesion set with clipping of LA appendage    Past Surgical History:  Procedure Laterality Date  . ANTERIOR CERVICAL DECOMP/DISCECTOMY FUSION N/A 06/20/2016   Procedure: Anterior Cervical Discectomy  Fusion - Cervical five-Cervical six - Cervical six-Cervical seven - Cervicla seven- Thoracic one;  Surgeon: Earnie Larsson, MD;  Location: [place];  Service: Neurosurgery;  Laterality: N/A;  . CARDIOVERSION     X4   . CORONARY ARTERY BYPASS GRAFT  11/26/2011   Procedure: CORONARY ARTERY BYPASS GRAFTING (CABG);  Surgeon: Rexene Alberts, MD;  Location: [place];  Service: Open Heart Surgery;  Laterality: N/A;  coronary artery bypass on pump times one utilizing the right internal mammary artery, transesophageal echocardiogram   . HAND SURGERY Left   . LEFT HEART CATHETERIZATION WITH CORONARY ANGIOGRAM N/A 11/24/2011   Procedure: LEFT HEART CATHETERIZATION WITH CORONARY ANGIOGRAM;  Surgeon: Peter M Martinique, MD;  Location[facility] CATH LAB;  Service: Cardiovascular;  Laterality: N/A;  . LOOP RECORDER IMPLANT N/A 07/06/2012   Procedure: LOOP RECORDER IMPLANT;  Surgeon: Thompson Grayer, MD;  Location: [facility] CATH LAB;  Service: Cardiovascular;  Laterality: N/A;  . MAZE  11/26/2011   Procedure: MAZE;  Surgeon: Rexene Alberts, MD;  Location: [place];  Service: Open Heart Surgery;  Laterality: N/A;  Cryomaze   . Radiofrequency Ablation for atrial fibrillation  12/30/07 and 06/05/09   afib ablation x 2 by JA  . Radiofrequency ablation for atrial flutter  2005  CTI  . TOTAL KNEE ARTHROPLASTY Right 04/05/2019   Procedure: RIGHT TOTAL KNEE ARTHROPLASTY;  Surgeon: Melrose Nakayama, MD;  [facility];  Service: Orthopedics;  Laterality: Right;    There were no vitals filed for this visit.  Subjective Assessment - 07/12/19 1044    Subjective  no pain doing well . Can't  kneel down. Stood for 45 min uneven ground  incr pain that night.   cleaning with twist on RT knee give stabbing pain.  most steps walking was  13[address] lately.    Currently in Pain?  No/denies                        [facility] Adult PT Treatment/Exercise - 07/12/19 0001      Knee/Hip Exercises: Aerobic   Nustep  L6 LE 10 m inutes       Knee/Hip  Exercises: Machines for Strengthening   Cybex Leg Press  5 plates  [id number],  6 plates x 8   5 plates with RT leg hold      Knee/Hip Exercises: Standing   Knee Flexion  Right;Left;20 reps    Knee Flexion Limitations  10#    Hip Flexion  Right;Left;20 reps    Hip Flexion Limitations  10#    Hip Abduction  Right;Left;20 reps    Abduction Limitations  10#    Hip Extension  Right;Left;20 reps    Extension Limitations  10#    Functional Squat  20 reps      Knee/Hip Exercises: Seated   Sit to Sand  10 reps;without UE support   with 15 # kettle bell     Knee/Hip Exercises: Supine   Other Supine Knee/Hip Exercises  PPT in 90/90 with hamstring activation x8             PT Education - 07/12/19 1132    Education Details  Discussed again progress and noly 4 months out and have to give progress 9-12 months for max benefit.   Suggested practice weight susdtained on motor cycle to  progress tolerance of RT leg to support bike    Person(s) Educated  Patient    Methods  Explanation    Comprehension  Verbalized understanding       PT Short Term Goals - 05/23/19 1032      PT SHORT TERM GOAL #1   Title  He will be indpendent with initial hEp    Status  Achieved      PT SHORT TERM GOAL #2   Title  He will be walking out of home with least restrictive device    Status  Achieved      PT SHORT TERM GOAL #3   Title  He will walk full time in home with [facility]    Status  Achieved      PT SHORT TERM GOAL #4   Title  Active RT knee flexion to 100 degrees or better    Status  Achieved        PT Long Term Goals - 07/05/19 1137      PT LONG TERM GOAL #1   Title  He will be indpendent with all HEP issued    Status  On-going      PT LONG TERM GOAL #2   Title  He will walk in home no device and out of home least restricted device    Status  Achieved      PT LONG TERM GOAL #3  Title  He will be independent with all normal home tasks    Status  Achieved      PT LONG TERM GOAL #4   Title   He will return to walking dog 1/4 mile for exercise    Status  Achieved      PT LONG TERM GOAL #5   Title  He will improve flexion to 120 degrees active and extension to -5-10 degrees to improve gait pattern and  tolerance    Status  Achieved      PT LONG TERM GOAL #6   Title  He will return to work at [employer] - 07/12/19 1048    Clinical Impression Statement  Contniues to have pain with long walks, twisting on knee, walk and stand on uneven ground, riding motorcycle. He has normal strength and can walk extended distances and is doing this regularly.    PT Treatment/Interventions  Taping;Manual techniques;Passive range of motion;Patient/family education;Therapeutic exercise;Therapeutic activities;Stair training;Gait training;Vasopneumatic Device;Cryotherapy    PT Next Visit Plan  contniue balance/gait, strength ,    PT Home Exercise Plan  All HEP reviewed and pt independent (HHPT HEP quad set, SAQ, LAQ, assisted knee flexion in sitting, heel slides SLR, side hip abduction), ITB  Tennis ball STW    Consulted and Agree with Plan of Care  Patient       Patient will benefit from skilled therapeutic intervention in order to improve the following deficits and impairments:  Pain, Difficulty walking, Decreased range of motion, Decreased balance, Decreased strength, Increased edema  Visit Diagnosis: Status post total right knee replacement  Difficulty in walking, not elsewhere classified     Problem List Patient Active Problem List   Diagnosis Date Noted  . Primary osteoarthritis of right knee 04/05/2019  . CKD (chronic kidney disease) stage 3, GFR 30-59 ml/min 03/14/2019  . Degenerative joint disease of knee, right 09/27/2018  . Dyspnea 09/06/2018  . Right knee pain 09/06/2018  . Radiculitis of left cervical region 03/05/2018  . Plantar fasciitis, left 08/19/2017  . Chest pain 02/27/2017  . Right ear pain 02/27/2017  . Left groin pain  09/13/2016  . Rib pain on left side 09/13/2016  . Left leg pain 09/13/2016  . Stenosis of cervical spine with myelopathy ([place]) 06/20/2016  . Special screening for malignant neoplasms, colon 04/04/2016  . Cough 01/11/2016  . COPD exacerbation ([place]) 12/22/2015  . Rectal bleeding 08/14/2014  . Intercostal muscle strain 08/07/2014  . Chronic meniscal tear of knee 04/06/2014  . Heel bone fracture 04/06/2014  . Hyperglycemia 04/04/2014  . Recurrent falls 04/04/2014  . Bilateral knee pain 04/04/2014  . Intractable left heel pain 04/04/2014  . Insomnia 04/04/2014  . Hematochezia 04/04/2014  . Bladder neck obstruction 12/19/2013  . Encounter for therapeutic drug monitoring 03/30/2013  . Palpitations 07/06/2012  . Shortness of breath 01/19/2012  . Sick sinus syndrome ([place]) 12/01/2011  . S/P CABG x 1 11/26/2011  . S/P Maze operation for atrial fibrillation 11/26/2011  . CAD (coronary artery disease) 11/25/2011  . Paroxysmal atrial fibrillation ([place]) 11/24/2011  . Chronic combined systolic (congestive) and diastolic (congestive) heart failure ([place]) 11/24/2011  . Gross hematuria 12/12/2010  . Left knee pain 12/12/2010  . Left knee DJD 07/09/2010  . Preventative health care 07/07/2010  . Chronic anticoagulation 04/08/2010  . COPD (chronic obstructive pulmonary disease) ([place]) 10/04/2008  . OBSTRUCTIVE SLEEP APNEA 01/06/2008  .  ALLERGIC RHINITIS 01/14/2007  . Hypothyroidism 09/28/2006  . Hyperlipidemia 09/28/2006  . Anxiety state 09/28/2006  . Essential hypertension 09/28/2006    Darrel Hoover  PT 07/12/2019, 11:34 AM  [facility] [address] [place], [place], [address] Phone: [phone]   Fax:  [phone]  Name: Augustine Runser. MRN: [id number] Date of Birth: 02/04/1956

## 2019-07-13 DIAGNOSIS — G4733 Obstructive sleep apnea (adult) (pediatric): Secondary | ICD-10-CM | POA: Diagnosis not present

## 2019-07-14 ENCOUNTER — Ambulatory Visit: Payer: PPO

## 2019-07-14 ENCOUNTER — Other Ambulatory Visit: Payer: Self-pay

## 2019-07-14 DIAGNOSIS — M25661 Stiffness of right knee, not elsewhere classified: Secondary | ICD-10-CM | POA: Diagnosis not present

## 2019-07-14 DIAGNOSIS — Z96651 Presence of right artificial knee joint: Secondary | ICD-10-CM

## 2019-07-14 DIAGNOSIS — M6281 Muscle weakness (generalized): Secondary | ICD-10-CM

## 2019-07-14 DIAGNOSIS — R262 Difficulty in walking, not elsewhere classified: Secondary | ICD-10-CM

## 2019-07-14 NOTE — Therapy (Signed)
[place], [place], [address] Phone: [phone]   Fax:  [phone]  Physical Therapy Treatment/Discharge  Patient Details  Name: Scott [name]. MRN: [id number] Date of Birth: 13-Sep-1955 Referring Provider (PT): Genevive Bi   Encounter Date: 07/14/2019  PT End of Session - 07/14/19 1046    Visit Number  26    Number of Visits  28    Date for PT Re-Evaluation  07/22/19    Authorization Type  [employer]    Authorization Time Period  progress visit 30       KX visit 15    PT Start Time  1040    PT Stop Time  1130    PT Time Calculation (min)  50 min    Activity Tolerance  Patient tolerated treatment well;No increased pain    Behavior During Therapy  WFL for tasks assessed/performed       Past Medical History:  Diagnosis Date  . ALLERGIC RHINITIS 01/14/2007  . ANXIETY 09/28/2006  . Asbestos exposure   . Atrial fibrillation ([place]) 09/28/2006   s/p afib ablation x 2  . CHF (congestive heart failure) ([place])   . CKD (chronic kidney disease), stage III    patient denies  . COPD (chronic obstructive pulmonary disease) ([place])   . Coronary artery disease 11/24/2011  . Diastolic dysfunction 0/08/3708  . Dyspnea    W/ EXERTION   . Dysrhythmia    AFIB     . First degree AV block 10/12/2018   Noted on EKG  . Gallstones   . Hyperglycemia   . HYPERLIPIDEMIA 09/28/2006  . HYPERTENSION 09/28/2006  . HYPOTHYROIDISM, ACQUIRED NEC 09/28/2006   pt denies and doesn't know  . Long term current use of anticoagulant 04/08/2010  . Obese   . OBSTRUCTIVE SLEEP APNEA 01/06/2008   NOT USING    . Right knee DJD 07/09/2010  . S/P CABG x 1 11/26/2011   Right internal mammary artery to right coronary artery  . S/P Maze operation for atrial fibrillation 11/26/2011   complete biatrial lesion set with clipping of LA appendage    Past Surgical History:  Procedure Laterality Date  . ANTERIOR CERVICAL  DECOMP/DISCECTOMY FUSION N/A 06/20/2016   Procedure: Anterior Cervical Discectomy Fusion - Cervical five-Cervical six - Cervical six-Cervical seven - Cervicla seven- Thoracic one;  Surgeon: Earnie Larsson, MD;  Location: [facility];  Service: Neurosurgery;  Laterality: N/A;  . CARDIOVERSION     X4   . CORONARY ARTERY BYPASS GRAFT  11/26/2011   Procedure: CORONARY ARTERY BYPASS GRAFTING (CABG);  Surgeon: Rexene Alberts, MD;  Location: [facility];  Service: Open Heart Surgery;  Laterality: N/A;  coronary artery bypass on pump times one utilizing the right internal mammary artery, transesophageal echocardiogram   . HAND SURGERY Left   . LEFT HEART CATHETERIZATION WITH CORONARY ANGIOGRAM N/A 11/24/2011   Procedure: LEFT HEART CATHETERIZATION WITH CORONARY ANGIOGRAM;  Surgeon: Peter M Martinique, MD;  Location: [facility] CATH LAB;  Service: Cardiovascular;  Laterality: N/A;  . LOOP RECORDER IMPLANT N/A 07/06/2012   Procedure: LOOP RECORDER IMPLANT;  Surgeon: Thompson Grayer, MD;  Location: [facility] CATH LAB;  Service: Cardiovascular;  Laterality: N/A;  . MAZE  11/26/2011   Procedure: MAZE;  Surgeon: Rexene Alberts, MD;  Location: [facility];  Service: Open Heart Surgery;  Laterality: N/A;  Cryomaze   . Radiofrequency Ablation for atrial fibrillation  12/30/07 and 06/05/09   afib ablation x 2 by JA  .  Radiofrequency ablation for atrial flutter  2005   CTI  . TOTAL KNEE ARTHROPLASTY Right 04/05/2019   Procedure: RIGHT TOTAL KNEE ARTHROPLASTY;  Surgeon: Melrose Nakayama, MD;  Location: [facility];  Service: Orthopedics;  Laterality: Right;    There were no vitals filed for this visit.                     [facility] [facility] Adult PT Treatment/Exercise - 07/14/19 0001      Knee/Hip Exercises: Aerobic   Nustep  L6 LE 10 m inutes       Knee/Hip Exercises: Standing   Knee Flexion  Right;Left;15 reps    Knee Flexion Limitations  green band    Hip Flexion  Right;Left;15 reps    Hip Flexion Limitations  green band    Hip Abduction   Right;Left;15 reps    Abduction Limitations  green band    Hip Extension  Right;Left;20 reps    Extension Limitations  green band               PT Short Term Goals - 05/23/19 1032      PT SHORT TERM GOAL #1   Title  He will be indpendent with initial hEp    Status  Achieved      PT SHORT TERM GOAL #2   Title  He will be walking out of home with least restrictive device    Status  Achieved      PT SHORT TERM GOAL #3   Title  He will walk full time in home with [facility]    Status  Achieved      PT SHORT TERM GOAL #4   Title  Active RT knee flexion to 100 degrees or better    Status  Achieved        PT Long Term Goals - 07/14/19 1307      PT LONG TERM GOAL #1   Title  He will be indpendent with all HEP issued    Status  Achieved      PT LONG TERM GOAL #2   Title  He will walk in home no device and out of home least restricted device    Status  Achieved      PT LONG TERM GOAL #3   Title  He will be independent with all normal home tasks    Status  Achieved      PT LONG TERM GOAL #4   Title  He will return to walking dog 1/4 mile for exercise    Status  Achieved      PT LONG TERM GOAL #5   Title  He will improve flexion to 120 degrees active and extension to -5-10 degrees to improve gait pattern and  tolerance    Status  Achieved      PT LONG TERM GOAL #6   Title  He will return to work at [employer] - 07/14/19 1101    Clinical Impression Statement  Scott [name] has progressed well post TKA. He is walking up to 13000 steps every 2-3 days and walks dog daily. He is able to do his yard work and clran his home. 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  He continues to ave pain with prolonged activity on feetand with walkin on uneven terrin.  He is consistent with  his HEP/  He continues to have RT knee and LE edema and I suggested he start wearing his compression hose again. He stated he stopped this  a few daysafter getting home from the hospital.   I assured him that his pain ad swe;;ing would get better over time. I wouls be glad to see him again  if needed in future    PT Treatment/Interventions  Taping;Manual techniques;Passive range of motion;Patient/family education;Therapeutic exercise;Therapeutic activities;Stair training;Gait training;Vasopneumatic Device;Cryotherapy    PT Next Visit Plan  Dischare with HEP    PT Home Exercise Plan  All HEP reviewed and pt independent (HHPT HEP quad set, SAQ, LAQ, assisted knee flexion in sitting, heel slides SLR, side hip abduction), ITB  Tennis ball STW    Consulted and Agree with Plan of Care  Patient       Patient will benefit from skilled therapeutic intervention in order to improve the following deficits and impairments:  Pain, Difficulty walking, Decreased range of motion, Decreased balance, Decreased strength, Increased edema  Visit Diagnosis: Status post total right knee replacement  Difficulty in walking, not elsewhere classified  Muscle weakness (generalized)  Stiffness of right knee, not elsewhere classified     Problem List Patient Active Problem List   Diagnosis Date Noted  . Primary osteoarthritis of right knee 04/05/2019  . CKD (chronic kidney disease) stage 3, GFR 30-59 ml/min 03/14/2019  . Degenerative joint disease of knee, right 09/27/2018  . Dyspnea 09/06/2018  . Right knee pain 09/06/2018  . Radiculitis of left cervical region 03/05/2018  . Plantar fasciitis, left 08/19/2017  . Chest pain 02/27/2017  . Right ear pain 02/27/2017  . Left groin pain 09/13/2016  . Rib pain on left side 09/13/2016  . Left leg pain 09/13/2016  . Stenosis of cervical spine with myelopathy ([place]) 06/20/2016  . Special screening for malignant neoplasms, colon 04/04/2016  . Cough 01/11/2016  . COPD exacerbation ([place]) 12/22/2015  . Rectal bleeding 08/14/2014  . Intercostal muscle strain 08/07/2014  . Chronic meniscal tear of knee  04/06/2014  . Heel bone fracture 04/06/2014  . Hyperglycemia 04/04/2014  . Recurrent falls 04/04/2014  . Bilateral knee pain 04/04/2014  . Intractable left heel pain 04/04/2014  . Insomnia 04/04/2014  . Hematochezia 04/04/2014  . Bladder neck obstruction 12/19/2013  . Encounter for therapeutic drug monitoring 03/30/2013  . Palpitations 07/06/2012  . Shortness of breath 01/19/2012  . Sick sinus syndrome ([place]) 12/01/2011  . S/P CABG x 1 11/26/2011  . S/P Maze operation for atrial fibrillation 11/26/2011  . CAD (coronary artery disease) 11/25/2011  . Paroxysmal atrial fibrillation ([place]) 11/24/2011  . Chronic combined systolic (congestive) and diastolic (congestive) heart failure ([place]) 11/24/2011  . Gross hematuria 12/12/2010  . Left knee pain 12/12/2010  . Left knee DJD 07/09/2010  . Preventative health care 07/07/2010  . Chronic anticoagulation 04/08/2010  . COPD (chronic obstructive pulmonary disease) ([place]) 10/04/2008  . OBSTRUCTIVE SLEEP APNEA 01/06/2008  . ALLERGIC RHINITIS 01/14/2007  . Hypothyroidism 09/28/2006  . Hyperlipidemia 09/28/2006  . Anxiety state 09/28/2006  . Essential hypertension 09/28/2006    Darrel Hoover  PT 07/14/2019, 1:08 PM  [facility] [address] [place], [place], [address] Phone: [phone]   Fax:  [phone]  Name: Scott [name]. MRN: [id number] Date of Birth: [birth year]  PHYSICAL THERAPY DISCHARGE SUMMARY  Visits from Start of Care: 26  Current functional level related to goals / functional outcomes: See above All goals met  Remaining deficits: Intermittant pain , swelling RT knee   Education / Equipment: HEP  Plan: Patient agrees to discharge.  Patient goals were met. Patient is being discharged due to meeting the stated rehab goals.  ?????

## 2019-07-17 ENCOUNTER — Other Ambulatory Visit: Payer: Self-pay | Admitting: Internal Medicine

## 2019-07-18 NOTE — Telephone Encounter (Signed)
Done erx 

## 2019-07-27 DIAGNOSIS — G4733 Obstructive sleep apnea (adult) (pediatric): Secondary | ICD-10-CM | POA: Diagnosis not present

## 2019-08-05 ENCOUNTER — Other Ambulatory Visit: Payer: Self-pay | Admitting: Student

## 2019-08-26 DIAGNOSIS — G4733 Obstructive sleep apnea (adult) (pediatric): Secondary | ICD-10-CM | POA: Diagnosis not present

## 2019-09-14 ENCOUNTER — Other Ambulatory Visit: Payer: Self-pay | Admitting: Internal Medicine

## 2019-09-14 NOTE — Telephone Encounter (Signed)
Done erx 

## 2019-09-15 ENCOUNTER — Other Ambulatory Visit: Payer: Self-pay | Admitting: Internal Medicine

## 2019-09-15 NOTE — Telephone Encounter (Signed)
Please refill as per office routine med refill policy (all routine meds refilled for 3 mo or monthly per pt preference up to one year from last visit, then month to month grace period for 3 mo, then further med refills will have to be denied)  

## 2019-09-26 DIAGNOSIS — G4733 Obstructive sleep apnea (adult) (pediatric): Secondary | ICD-10-CM | POA: Diagnosis not present

## 2019-10-13 ENCOUNTER — Other Ambulatory Visit: Payer: Self-pay | Admitting: Internal Medicine

## 2019-10-13 NOTE — Telephone Encounter (Signed)
Please refill as per office routine med refill policy (all routine meds refilled for 3 mo or monthly per pt preference up to one year from last visit, then month to month grace period for 3 mo, then further med refills will have to be denied)  

## 2019-10-17 ENCOUNTER — Other Ambulatory Visit: Payer: Self-pay | Admitting: Internal Medicine

## 2019-10-17 NOTE — Telephone Encounter (Signed)
Too soon for xanax - has refill to Dec 18 2019

## 2019-10-24 ENCOUNTER — Ambulatory Visit: Payer: Self-pay

## 2019-10-27 DIAGNOSIS — G4733 Obstructive sleep apnea (adult) (pediatric): Secondary | ICD-10-CM | POA: Diagnosis not present

## 2019-11-07 ENCOUNTER — Other Ambulatory Visit: Payer: Self-pay | Admitting: Student

## 2019-11-07 DIAGNOSIS — I5032 Chronic diastolic (congestive) heart failure: Secondary | ICD-10-CM

## 2019-11-07 MED ORDER — FUROSEMIDE 20 MG PO TABS: 20.0000 mg | ORAL_TABLET | ORAL | 1 refills | Status: DC | PRN

## 2019-11-18 ENCOUNTER — Other Ambulatory Visit: Payer: Self-pay | Admitting: Student

## 2019-11-18 DIAGNOSIS — I5032 Chronic diastolic (congestive) heart failure: Secondary | ICD-10-CM

## 2019-11-26 DIAGNOSIS — G4733 Obstructive sleep apnea (adult) (pediatric): Secondary | ICD-10-CM | POA: Diagnosis not present

## 2019-12-27 DIAGNOSIS — G4733 Obstructive sleep apnea (adult) (pediatric): Secondary | ICD-10-CM | POA: Diagnosis not present

## 2019-12-29 ENCOUNTER — Encounter: Payer: PPO | Admitting: Internal Medicine

## 2019-12-30 ENCOUNTER — Other Ambulatory Visit: Payer: Self-pay | Admitting: Internal Medicine

## 2020-01-02 ENCOUNTER — Other Ambulatory Visit: Payer: Self-pay | Admitting: Internal Medicine

## 2020-01-02 NOTE — Telephone Encounter (Signed)
Please refill as per office routine med refill policy (all routine meds refilled for 3 mo or monthly per pt preference up to one year from last visit, then month to month grace period for 3 mo, then further med refills will have to be denied)  

## 2020-01-06 ENCOUNTER — Ambulatory Visit (INDEPENDENT_AMBULATORY_CARE_PROVIDER_SITE_OTHER): Payer: PPO

## 2020-01-06 DIAGNOSIS — Z0000 Encounter for general adult medical examination without abnormal findings: Secondary | ICD-10-CM | POA: Diagnosis not present

## 2020-01-06 NOTE — Progress Notes (Signed)
I connected with Scott [name], Scott [name]. today by telephone and verified that I am speaking with the correct person using two identifiers. Location patient: home Location provider: work Persons participating in the virtual visit: Scott [name], Scott [name]. and [place], LPN.   I discussed the limitations, risks, security and privacy concerns of performing an evaluation and management service by telephone and the availability of in person appointments. I also discussed with the patient that there may be a patient responsible charge related to this service. The patient expressed understanding and verbally consented to this telephonic visit.    Interactive audio and video telecommunications were attempted between this provider and patient, however failed, due to patient having technical difficulties OR patient did not have access to video capability.  We continued and completed visit with audio only.  Some vital signs may be absent or patient reported.   Time Spent with patient on telephone encounter: 35 minutes  Subjective:   Scott [name]. is a 64 y.o. male who presents for Medicare Annual/Subsequent preventive examination.  Review of Systems    No ROS. Medicare Wellness Visit. Additional risk factors are reflected in social history. Cardiac Risk Factors include: advanced age (>1men, >40 women);dyslipidemia;family history of premature cardiovascular disease;hypertension;male gender Sleep Patterns: No sleep issues, feels rested on waking and sleeps 8 hours nightly. Home Safety/Smoke Alarms: Feels safe in home; uses home alarm. Smoke alarms in place. Living environment: 1-story home; Lives alone with his dog; no needs for DME; good support system. Seat Belt Safety/Bike Helmet: Wears seat belt.     Objective:    There were no vitals filed for this visit. There is no height or weight on file to calculate BMI.  Advanced Directives 01/06/2020 04/18/2019 03/31/2019 11/14/2018 10/18/2018  10/09/2017 06/20/2016  Does Patient Have a Medical Advance Directive? Yes Yes Yes Yes Yes Yes Yes  Type of Advance Directive - [place];Living will [place];Living will Living will [place];Living will [place];Living will [place];Living will  Does patient want to make changes to medical advance directive? No - Patient declined - No - Patient declined No - Patient declined - - No - Patient declined  Copy of [place] in Chart? - No - copy requested No - copy requested - No - copy requested No - copy requested No - copy requested  Would patient like information on creating a medical advance directive? - - - - - - -  Pre-existing out of facility DNR order (yellow form or pink MOST form) - - - - - - -    Current Medications (verified) Outpatient Encounter Medications as of 01/06/2020  Medication Sig  . temazepam (RESTORIL) 30 MG capsule TAKE 1 CAPSULE (30 MG TOTAL) BY MOUTH AT BEDTIME AS NEEDED. FOR SLEEP  . acetaminophen (TYLENOL) 500 MG tablet Take 1,000 mg by mouth every 6 (six) hours as needed for mild pain.   Marland Kitchen ALPRAZolam (XANAX) 0.5 MG tablet TAKE 1 TABLET BY MOUTH TWICE A DAY AS NEEDED FOR ANXIETY  . budesonide-formoterol (SYMBICORT) 160-4.5 MCG/ACT inhaler TAKE 2 PUFFS BY MOUTH TWICE A DAY  . cholecalciferol (VITAMIN D) 1000 units tablet Take 1,000 Units by mouth daily.  Marland Kitchen docusate sodium (COLACE) 100 MG capsule Take 200 mg by mouth daily as needed for mild constipation.   Marland Kitchen ELIQUIS 5 MG TABS tablet TAKE 1 TABLET BY MOUTH TWICE A DAY  . furosemide (LASIX) 20 MG tablet Take  1 tablet (20 mg total) by mouth daily as needed.  . gabapentin (NEURONTIN) 100 MG capsule TAKE 1 CAPSULE BY MOUTH THREE TIMES A DAY  . lovastatin (MEVACOR) 40 MG tablet TAKE 2 TABLETS (80 MG TOTAL) BY MOUTH EVERY EVENING.  . metoprolol succinate (TOPROL-XL) 50 MG 24 hr tablet TAKE 1 AND 1/2 TABLETS BY  MOUTH DAILY  . Multiple Vitamin (MULTIVITAMIN WITH MINERALS) TABS tablet Take 1 tablet by mouth daily.   . Olmesartan-amLODIPine-HCTZ 40-5-12.5 MG TABS TAKE 1 TABLET BY MOUTH EVERY DAY  . PROAIR HFA 108 (90 Base) MCG/ACT inhaler TAKE 2 PUFFS BY MOUTH EVERY 6 HOURS AS NEEDED FOR WHEEZE OR SHORTNESS OF BREATH (Patient taking differently: Inhale 2 puffs into the lungs every 6 (six) hours as needed for wheezing or shortness of breath. )  . spironolactone (ALDACTONE) 25 MG tablet Take 0.5 tablets (12.5 mg total) by mouth at bedtime.  Marland Kitchen tiZANidine (ZANAFLEX) 4 MG tablet Take 1 tablet (4 mg total) by mouth every 6 (six) hours as needed for muscle spasms.  . traMADol (ULTRAM) 50 MG tablet TAKE 1 TABLET BY MOUTH EVERY 6 HOURS AS NEEDED   No facility-administered encounter medications on file as of 01/06/2020.    Allergies (verified) Zolpidem tartrate   History: Past Medical History:  Diagnosis Date  . ALLERGIC RHINITIS 01/14/2007  . ANXIETY 09/28/2006  . Asbestos exposure   . Atrial fibrillation ([place]) 09/28/2006   s/p afib ablation x 2  . CHF (congestive heart failure) ([place])   . CKD (chronic kidney disease), stage III ([place])    patient denies  . COPD (chronic obstructive pulmonary disease) ([place])   . Coronary artery disease 11/24/2011  . Diastolic dysfunction 4/0/1027  . Dyspnea    W/ EXERTION   . Dysrhythmia    AFIB     . First degree AV block 10/12/2018   Noted on EKG  . Gallstones   . Hyperglycemia   . HYPERLIPIDEMIA 09/28/2006  . HYPERTENSION 09/28/2006  . HYPOTHYROIDISM, ACQUIRED NEC 09/28/2006   pt denies and doesn't know  . Long term current use of anticoagulant 04/08/2010  . Obese   . OBSTRUCTIVE SLEEP APNEA 01/06/2008   NOT USING    . Right knee DJD 07/09/2010  . S/P CABG x 1 11/26/2011   Right internal mammary artery to right coronary artery  . S/P Maze operation for atrial fibrillation 11/26/2011   complete biatrial lesion set with clipping of LA appendage   Past Surgical History:   Procedure Laterality Date  . ANTERIOR CERVICAL DECOMP/DISCECTOMY FUSION N/A 06/20/2016   Procedure: Anterior Cervical Discectomy Fusion - Cervical five-Cervical six - Cervical six-Cervical seven - Cervicla seven- Thoracic one;  Surgeon: Earnie Larsson, MD;  Location: [facility];  Service: Neurosurgery;  Laterality: N/A;  . CARDIOVERSION     X4   . CORONARY ARTERY BYPASS GRAFT  11/26/2011   Procedure: CORONARY ARTERY BYPASS GRAFTING (CABG);  Surgeon: Rexene Alberts, MD;  Location: [facility];  Service: Open Heart Surgery;  Laterality: N/A;  coronary artery bypass on pump times one utilizing the right internal mammary artery, transesophageal echocardiogram   . HAND SURGERY Left   . LEFT HEART CATHETERIZATION WITH CORONARY ANGIOGRAM N/A 11/24/2011   Procedure: LEFT HEART CATHETERIZATION WITH CORONARY ANGIOGRAM;  Surgeon: Peter M Martinique, MD;  Location: [facility] CATH LAB;  Service: Cardiovascular;  Laterality: N/A;  . LOOP RECORDER IMPLANT N/A 07/06/2012   Procedure: LOOP RECORDER IMPLANT;  Surgeon: Thompson Grayer, MD;  Location[facility] CATH LAB;  Service:  Cardiovascular;  Laterality: N/A;  . MAZE  11/26/2011   Procedure: MAZE;  Surgeon: Rexene Alberts, MD;  Location: [facility];  Service: Open Heart Surgery;  Laterality: N/A;  C[place]omaze   . Radiofrequency Ablation for atrial fibrillation  12/30/07 and 06/05/09   afib ablation x 2 by JA  . Radiofrequency ablation for atrial flutter  2005   CTI  . TOTAL KNEE ARTHROPLASTY Right 04/05/2019   Procedure: RIGHT TOTAL KNEE ARTHROPLASTY;  Surgeon: Melrose Nakayama, MD;  Location: [facility];  Service: Orthopedics;  Laterality: Right;   Family History  Problem Relation Age of Onset  . Dementia Father   . Hypertension Father   . Atrial fibrillation Mother   . Stroke Mother   . Dementia Paternal Grandfather   . Cancer Paternal Grandmother        type unknown   Social History   Socioeconomic History  . Marital status: Single    Spouse name: Not on file  . Number of children: 0  . Years  of education: Not on file  . Highest education level: Not on file  Occupational History  . Occupation: works at the [employer]  . Smoking status: Former Smoker    Packs/day: 1.50    Years: 26.00    Pack years: 39.00    Types: Cigarettes    Quit date: 04/06/1995    Years since quitting: 24.7  . Smokeless tobacco: Never Used  Vaping Use  . Vaping Use: Never used  Substance and Sexual Activity  . Alcohol use: No    Alcohol/week: 0.0 standard drinks  . Drug use: No  . Sexual activity: Yes  Other Topics Concern  . Not on file  Social History Narrative   Pt lives in [place], [place] with his friend.    Social Determinants of Health   Financial Resource Strain: Low Risk   . Difficulty of Paying Living Expenses: Not hard at all  Food Insecurity: No Food Insecurity  . Worried About Charity fundraiser in the Last Year: Never true  . Ran Out of Food in the Last Year: Never true  Transportation Needs: No Transportation Needs  . Lack of Transportation (Medical): No  . Lack of Transportation (Non-Medical): No  Physical Activity: Sufficiently Active  . Days of Exercise per Week: 7 days  . Minutes of Exercise per Session: 30 min  Stress: No Stress Concern Present  . Feeling of Stress : Not at all  Social Connections: Moderately Integrated  . Frequency of Communication with Friends and Family: More than three times a week  . Frequency of Social Gatherings with Friends and Family: More than three times a week  . Attends Religious Services: 1 to 4 times per year  . Active Member of Clubs or Organizations: Yes  . Attends Archivist Meetings: 1 to 4 times per year  . Marital Status: Never married    Tobacco Counseling Counseling given: Not Answered   Clinical Intake:  Pre-visit preparation completed: Yes  Pain : No/denies pain     Nutritional Risks: None Diabetes: No  How often do you need to have someone help you when you read instructions,  pamphlets, or other written materials from your doctor or pharmacy?: 1 - Never What is the last grade level you completed in school?: Bachelor's Degree  Diabetic? no  Interpreter Needed?: No  Information entered by :: Lisette Abu, LPN   Activities of Daily Living In your present state of health,  do you have any difficulty performing the following activities: 01/06/2020 03/31/2019  Hearing? N N  Vision? N N  Difficulty concentrating or making decisions? N N  Walking or climbing stairs? N Y  Dressing or bathing? N N  Doing errands, shopping? N N  Preparing Food and eating ? N -  Using the Toilet? N -  In the past six months, have you accidently leaked urine? N -  Do you have problems with loss of bowel control? N -  Managing your Medications? N -  Managing your Finances? N -  Housekeeping or managing your Housekeeping? N -  Some recent data might be hidden    Patient Care Team: [name], MD as PCP - General Trula Slade, DPM as Consulting Physician (Podiatry) Evans Lance, MD as Consulting Physician (Cardiology) Irene Shipper, MD as Consulting Physician (Gastroenterology)  Indicate any recent Medical Services you may have received from other than [facility] providers in the past year (date may be approximate).     Assessment:   This is a routine wellness examination for [place].  Hearing/Vision screen No exam data present  Dietary issues and exercise activities discussed: Current Exercise Habits: Home exercise routine, Type of exercise: walking (walks dog everyday 4.5 miles), Time (Minutes): 60, Frequency (Times/Week): 7, Weekly Exercise (Minutes/Week): 420, Intensity: Moderate, Exercise limited by: orthopedic condition(s);cardiac condition(s);respiratory conditions(s)  Goals    . Patient Stated     Maintain current health status.    . Patient Stated    . Weight (lb) < 155 lb (70.3 kg)      Depression Screen PHQ 2/9 Scores 01/06/2020 03/14/2019 10/18/2018  03/05/2018 10/09/2017 02/27/2017 11/11/2016  PHQ - 2 Score 0 0 1 0 0 0 0    Fall Risk Fall Risk  01/06/2020 03/14/2019 10/18/2018 03/05/2018 10/09/2017  Falls in the past year? 1 1 1 1  Yes  Number falls in past yr: 1 1 1 1 1   Comment - - - tripped and falls over new puppy -  Injury with Fall? 0 0 0 0 No  Risk Factor Category  - - - - -  Comment - - - - -  Risk for fall due to : Impaired balance/gait;Orthopedic patient;Other (Comment) - Impaired balance/gait - Impaired mobility;Impaired balance/gait  Risk for fall due to: Comment knee replacement - - - -  Follow up Falls evaluation completed - Falls prevention discussed - Falls prevention discussed    Any stairs in or around the home? No  If so, are there any without handrails? No  Home free of loose throw rugs in walkways, pet beds, electrical cords, etc? Yes  Adequate lighting in your home to reduce risk of falls? Yes   ASSISTIVE DEVICES UTILIZED TO PREVENT FALLS:  Life alert? No  Use of a cane, walker or w/c? No  Grab bars in the bathroom? No  Shower chair or bench in shower? No  Elevated toilet seat or a handicapped toilet? No   TIMED UP AND GO:  Was the test performed? No .  Length of time to ambulate 10 feet: 0 sec.   Gait steady and fast without use of assistive device  Cognitive Function: MMSE - Mini Mental State Exam 10/09/2017 03/21/2016  Not completed: Refused -  Orientation to time - 4  Orientation to Place - 5  Registration - 3  Attention/ Calculation - 5  Recall - 2  Language- name 2 objects - 2  Language- repeat - 1  Language- follow 3  step command - 3  Language- read & follow direction - 1  Write a sentence - 1  Copy design - 1  Total score - 28        Immunizations Immunization History  Administered Date(s) Administered  . Influenza Inj Mdck Quad Pf 11/20/2018  . Influenza Split 11/25/2011  . Influenza Whole 01/13/2005, 11/26/2007, 12/06/2008, 11/29/2009  . Influenza,inj,Quad PF,6+ Mos 03/08/2015,  11/04/2019  . Influenza-Unspecified 10/25/2012, 11/21/2015, 11/25/2015, 12/25/2016  . PFIZER SARS-COV-2 Vaccination 05/26/2019, 06/20/2019  . Pneumococcal Conjugate-13 12/21/2015  . Pneumococcal Polysaccharide-23 03/11/2011  . Td 03/27/2004, 04/04/2014  . Zoster 01/04/2016    TDAP status: Up to date Flu Vaccine status: Up to date Pneumococcal vaccine status: Up to date Covid-19 vaccine status: Completed vaccines  Qualifies for Shingles Vaccine? Yes   Zostavax completed Yes   Shingrix Completed?: No.    Education has been provided regarding the importance of this vaccine. Patient has been advised to call insurance company to determine out of pocket expense if they have not yet received this vaccine. Advised may also receive vaccine at local pharmacy or Health Dept. Verbalized acceptance and understanding.  Screening Tests Health Maintenance  Topic Date Due  . COLONOSCOPY  Never done  . TETANUS/TDAP  04/04/2024  . INFLUENZA VACCINE  Completed  . COVID-19 Vaccine  Completed  . Hepatitis C Screening  Completed  . HIV Screening  Completed    Health Maintenance  Health Maintenance Due  Topic Date Due  . COLONOSCOPY  Never done    Colorectal cancer screening: never done; will speak with pcp regarding Cologuard testing.  Lung Cancer Screening: (Low Dose CT Chest recommended if Age 3-80 years, 30 pack-year currently smoking OR have quit w/in 15years.) does qualify.   Lung Cancer Screening Referral: no  Additional Screening:  Hepatitis C Screening: does qualify; Completed : yes  Vision Screening: Recommended annual ophthalmology exams for early detection of glaucoma and other disorders of the eye. Is the patient up to date with their annual eye exam?  Yes  Who is the provider or what is the name of the office in which the patient attends annual eye exams? Carmel Sacramento, OD If pt is not established with a provider, would they like to be referred to a provider to establish care?  No .   Dental Screening: Recommended annual dental exams for proper oral hygiene  Community Resource Referral / Chronic Care Management: CRR required this visit?  No   CCM required this visit?  No      Plan:     I have personally reviewed and noted the following in the patient's chart:   . Medical and social history . Use of alcohol, tobacco or illicit drugs  . Current medications and supplements . Functional ability and status . Nutritional status . Physical activity . Advanced directives . List of other physicians . Hospitalizations, surgeries, and ER visits in previous 12 months . Vitals . Screenings to include cognitive, depression, and falls . Referrals and appointments  In addition, I have reviewed and discussed with patient certain preventive protocols, quality metrics, and best practice recommendations. A written personalized care plan for preventive services as well as general preventive health recommendations were provided to patient.     Sheral Flow, LPN   [id number]   Nurse Notes:  Patient is cogitatively intact. There were no vitals filed for this visit. There is no height or weight on file to calculate BMI. Patient stated that he has no issues with  gait or balance; does not use any assistive devices. Patient will speak with primary care at next visit regarding Cologuard.

## 2020-01-06 NOTE — Patient Instructions (Signed)
[name] , Thank you for taking time to come for your Medicare Wellness Visit. I appreciate your ongoing commitment to your health goals. Please review the following plan we discussed and let me know if I can assist you in the future.   Screening recommendations/referrals: Colonoscopy: never done Recommended yearly ophthalmology/optometry visit for glaucoma screening and checkup Recommended yearly dental visit for hygiene and checkup  Vaccinations: Influenza vaccine: 11/04/2019 Pneumococcal vaccine: up to date Tdap vaccine: 04/04/2014 Shingles vaccine: never done   Covid-19: up to date  Advanced directives: Documents on file.  Conditions/risks identified: Yes; Reviewed health maintenance screenings with patient today and relevant education, vaccines, and/or referrals were provided. Please continue to do your personal lifestyle choices by: daily care of teeth and gums, regular physical activity (goal should be 5 days a week for 30 minutes), eat a healthy diet, avoid tobacco and drug use, limiting any alcohol intake, taking a low-dose aspirin (if not allergic or have been advised by your provider otherwise) and taking vitamins and minerals as recommended by your provider. Continue doing brain stimulating activities (puzzles, reading, adult coloring books, staying active) to keep memory sharp. Continue to eat heart healthy diet (full of fruits, vegetables, whole grains, lean protein, water--limit salt, fat, and sugar intake) and increase physical activity as tolerated.  Next appointment: Please schedule your next Medicare Wellness Visit with your Nurse Health Advisor in 1 year by calling [phone].  Preventive Care 40-64 Years, Male Preventive care refers to lifestyle choices and visits with your health care provider that can promote health and wellness. What does preventive care include?  A yearly physical exam. This is also called an annual well check.  Dental exams once or twice a  year.  Routine eye exams. Ask your health care provider how often you should have your eyes checked.  Personal lifestyle choices, including:  Daily care of your teeth and gums.  Regular physical activity.  Eating a healthy diet.  Avoiding tobacco and drug use.  Limiting alcohol use.  Practicing safe sex.  Taking low-dose aspirin every day starting at age 66. What happens during an annual well check? The services and screenings done by your health care provider during your annual well check will depend on your age, overall health, lifestyle risk factors, and family history of disease. Counseling  Your health care provider may ask you questions about your:  Alcohol use.  Tobacco use.  Drug use.  Emotional well-being.  Home and relationship well-being.  Sexual activity.  Eating habits.  Work and work Statistician. Screening  You may have the following tests or measurements:  Height, weight, and BMI.  Blood pressure.  Lipid and cholesterol levels. These may be checked every 5 years, or more frequently if you are over 65 years old.  Skin check.  Lung cancer screening. You may have this screening every year starting at age 40 if you have a 30-pack-year history of smoking and currently smoke or have quit within the past 15 years.  Fecal occult blood test (FOBT) of the stool. You may have this test every year starting at age 37.  Flexible sigmoidoscopy or colonoscopy. You may have a sigmoidoscopy every 5 years or a colonoscopy every 10 years starting at age 28.  Prostate cancer screening. Recommendations will vary depending on your family history and other risks.  Hepatitis C blood test.  Hepatitis B blood test.  Sexually transmitted disease (STD) testing.  Diabetes screening. This is done by checking your blood sugar (glucose) after  you have not eaten for a while (fasting). You may have this done every 1-3 years. Discuss your test results, treatment options,  and if necessary, the need for more tests with your health care provider. Vaccines  Your health care provider may recommend certain vaccines, such as:  Influenza vaccine. This is recommended every year.  Tetanus, diphtheria, and acellular pertussis (Tdap, Td) vaccine. You may need a Td booster every 10 years.  Zoster vaccine. You may need this after age 30.  Pneumococcal 13-valent conjugate (PCV13) vaccine. You may need this if you have certain conditions and have not been vaccinated.  Pneumococcal polysaccharide (PPSV23) vaccine. You may need one or two doses if you smoke cigarettes or if you have certain conditions. Talk to your health care provider about which screenings and vaccines you need and how often you need them. This information is not intended to replace advice given to you by your health care provider. Make sure you discuss any questions you have with your health care provider. Document Released: 03/09/2015 Document Revised: 10/31/2015 Document Reviewed: 12/12/2014 Elsevier Interactive Patient Education  2017 [place] Prevention in the Home Falls can cause injuries. They can happen to people of all ages. There are many things you can do to make your home safe and to help prevent falls. What can I do on the outside of my home?  Regularly fix the edges of walkways and driveways and fix any cracks.  Remove anything that might make you trip as you walk through a door, such as a raised step or threshold.  Trim any bushes or trees on the path to your home.  Use bright outdoor lighting.  Clear any walking paths of anything that might make someone trip, such as rocks or tools.  Regularly check to see if handrails are loose or broken. Make sure that both sides of any steps have handrails.  Any raised decks and porches should have guardrails on the edges.  Have any leaves, snow, or ice cleared regularly.  Use sand or salt on walking paths during winter.  Clean  up any spills in your garage right away. This includes oil or grease spills. What can I do in the bathroom?  Use night lights.  Install grab bars by the toilet and in the tub and shower. Do not use towel bars as grab bars.  Use non-skid mats or decals in the tub or shower.  If you need to sit down in the shower, use a plastic, non-slip stool.  Keep the floor dry. Clean up any water that spills on the floor as soon as it happens.  Remove soap buildup in the tub or shower regularly.  Attach bath mats securely with double-sided non-slip rug tape.  Do not have throw rugs and other things on the floor that can make you trip. What can I do in the bedroom?  Use night lights.  Make sure that you have a light by your bed that is easy to reach.  Do not use any sheets or blankets that are too big for your bed. They should not hang down onto the floor.  Have a firm chair that has side arms. You can use this for support while you get dressed.  Do not have throw rugs and other things on the floor that can make you trip. What can I do in the kitchen?  Clean up any spills right away.  Avoid walking on wet floors.  Keep items that you use a  lot in easy-to-reach places.  If you need to reach something above you, use a strong step stool that has a grab bar.  Keep electrical cords out of the way.  Do not use floor polish or wax that makes floors slippery. If you must use wax, use non-skid floor wax.  Do not have throw rugs and other things on the floor that can make you trip. What can I do with my stairs?  Do not leave any items on the stairs.  Make sure that there are handrails on both sides of the stairs and use them. Fix handrails that are broken or loose. Make sure that handrails are as long as the stairways.  Check any carpeting to make sure that it is firmly attached to the stairs. Fix any carpet that is loose or worn.  Avoid having throw rugs at the top or bottom of the stairs.  If you do have throw rugs, attach them to the floor with carpet tape.  Make sure that you have a light switch at the top of the stairs and the bottom of the stairs. If you do not have them, ask someone to add them for you. What else can I do to help prevent falls?  Wear shoes that:  Do not have high heels.  Have rubber bottoms.  Are comfortable and fit you well.  Are closed at the toe. Do not wear sandals.  If you use a stepladder:  Make sure that it is fully opened. Do not climb a closed stepladder.  Make sure that both sides of the stepladder are locked into place.  Ask someone to hold it for you, if possible.  Clearly mark and make sure that you can see:  Any grab bars or handrails.  First and last steps.  Where the edge of each step is.  Use tools that help you move around (mobility aids) if they are needed. These include:  Canes.  Walkers.  Scooters.  Crutches.  Turn on the lights when you go into a dark area. Replace any light bulbs as soon as they burn out.  Set up your furniture so you have a clear path. Avoid moving your furniture around.  If any of your floors are uneven, fix them.  If there are any pets around you, be aware of where they are.  Review your medicines with your doctor. Some medicines can make you feel dizzy. This can increase your chance of falling. Ask your doctor what other things that you can do to help prevent falls. This information is not intended to replace advice given to you by your health care provider. Make sure you discuss any questions you have with your health care provider. Document Released: 12/07/2008 Document Revised: 07/19/2015 Document Reviewed: 03/17/2014 Elsevier Interactive Patient Education  2017 [employer].

## 2020-01-09 ENCOUNTER — Other Ambulatory Visit: Payer: Self-pay

## 2020-01-09 ENCOUNTER — Encounter: Payer: Self-pay | Admitting: Internal Medicine

## 2020-01-09 ENCOUNTER — Ambulatory Visit (INDEPENDENT_AMBULATORY_CARE_PROVIDER_SITE_OTHER): Payer: PPO | Admitting: Internal Medicine

## 2020-01-09 VITALS — BP 118/70 | HR 67 | Temp 98.1°F | Ht 66.0 in | Wt 219.0 lb

## 2020-01-09 DIAGNOSIS — T8484XA Pain due to internal orthopedic prosthetic devices, implants and grafts, initial encounter: Secondary | ICD-10-CM | POA: Insufficient documentation

## 2020-01-09 DIAGNOSIS — E785 Hyperlipidemia, unspecified: Secondary | ICD-10-CM | POA: Diagnosis not present

## 2020-01-09 DIAGNOSIS — T8484XD Pain due to internal orthopedic prosthetic devices, implants and grafts, subsequent encounter: Secondary | ICD-10-CM

## 2020-01-09 DIAGNOSIS — I10 Essential (primary) hypertension: Secondary | ICD-10-CM | POA: Diagnosis not present

## 2020-01-09 DIAGNOSIS — R739 Hyperglycemia, unspecified: Secondary | ICD-10-CM | POA: Diagnosis not present

## 2020-01-09 DIAGNOSIS — Z96651 Presence of right artificial knee joint: Secondary | ICD-10-CM

## 2020-01-09 DIAGNOSIS — N183 Chronic kidney disease, stage 3 unspecified: Secondary | ICD-10-CM

## 2020-01-09 DIAGNOSIS — E039 Hypothyroidism, unspecified: Secondary | ICD-10-CM | POA: Diagnosis not present

## 2020-01-09 DIAGNOSIS — Z0000 Encounter for general adult medical examination without abnormal findings: Secondary | ICD-10-CM

## 2020-01-09 LAB — HEPATIC FUNCTION PANEL
ALT: 28 U/L (ref 0–53)
AST: 29 U/L (ref 0–37)
Albumin: 4.2 g/dL (ref 3.5–5.2)
Alkaline Phosphatase: 57 U/L (ref 39–117)
Bilirubin, Direct: 0.1 mg/dL (ref 0.0–0.3)
Total Bilirubin: 0.6 mg/dL (ref 0.2–1.2)
Total Protein: 7.4 g/dL (ref 6.0–8.3)

## 2020-01-09 LAB — TSH: TSH: 3.88 u[IU]/mL (ref 0.35–4.50)

## 2020-01-09 LAB — BASIC METABOLIC PANEL
BUN: 23 mg/dL (ref 6–23)
CO2: 31 mEq/L (ref 19–32)
Calcium: 9.5 mg/dL (ref 8.4–10.5)
Chloride: 104 mEq/L (ref 96–112)
Creatinine, Ser: 1.52 mg/dL — ABNORMAL HIGH (ref 0.40–1.50)
GFR: 48.04 mL/min — ABNORMAL LOW (ref >60.00)
Glucose, Bld: 109 mg/dL — ABNORMAL HIGH (ref 70–99)
Potassium: 4.4 mEq/L (ref 3.5–5.1)
Sodium: 141 mEq/L (ref 135–145)

## 2020-01-09 LAB — LIPID PANEL
Cholesterol: 142 mg/dL (ref 0–200)
HDL: 40.7 mg/dL (ref >39.00)
LDL Cholesterol: 81 mg/dL (ref 0–99)
NonHDL: 101.13
Total CHOL/HDL Ratio: 3
Triglycerides: 100 mg/dL (ref 0.0–149.0)
VLDL: 20 mg/dL (ref 0.0–40.0)

## 2020-01-09 LAB — PHOSPHORUS: Phosphorus: 3.5 mg/dL (ref 2.3–4.6)

## 2020-01-09 LAB — VITAMIN D 25 HYDROXY (VIT D DEFICIENCY, FRACTURES): VITD: 42.26 ng/mL (ref 30.00–100.00)

## 2020-01-09 LAB — HEMOGLOBIN A1C: Hgb A1c MFr Bld: 5.5 % (ref 4.6–6.5)

## 2020-01-09 MED ORDER — TRAMADOL HCL 50 MG PO TABS
50.0000 mg | ORAL_TABLET | Freq: Four times a day (QID) | ORAL | 2 refills | Status: DC | PRN
Start: 2020-01-09 — End: 2020-07-30

## 2020-01-09 NOTE — Patient Instructions (Signed)
Please continue all other medications as before, and refills have been done if requested - the tramadol  Please have the pharmacy call with any other refills you may need.  Please continue your efforts at being more active, low cholesterol diet, and weight control.  Please keep your appointments with your specialists as you may have planned  You will be contacted regarding the referral for: colonoscopy  Please go to the LAB at the blood drawing area for the tests to be done  You will be contacted by phone if any changes need to be made immediately.  Otherwise, you will receive a letter about your results with an explanation, but please check with MyChart first.  Please remember to sign up for MyChart if you have not done so, as this will be important to you in the future with finding out test results, communicating by private email, and scheduling acute appointments online when needed.  Please make an Appointment to return in 6 months, or sooner if needed

## 2020-01-09 NOTE — Progress Notes (Addendum)
Subjective:    Patient ID: Scott Hones., male    DOB: 1955-08-05, 64 y.o.   MRN: [id number]  HPI  Here to f/u; overall doing ok,  Pt denies chest pain, increasing sob or doe, wheezing, orthopnea, PND, increased LE swelling, palpitations, dizziness or syncope.  Pt denies new neurological symptoms such as new headache, or facial or extremity weakness or numbness.  Pt denies polydipsia, polyuria, or low sugar episode.  Pt states overall good compliance with meds, mostly trying to follow appropriate diet, with wt overall stable,  but little exercise however. Still has pain post right knee TKR.  Still due for colonoscopy.   Past Medical History:  Diagnosis Date  . ALLERGIC RHINITIS 01/14/2007  . ANXIETY 09/28/2006  . Asbestos exposure   . Atrial fibrillation ([place]) 09/28/2006   s/p afib ablation x 2  . CHF (congestive heart failure) ([place])   . CKD (chronic kidney disease), stage III ([place])    patient denies  . COPD (chronic obstructive pulmonary disease) ([place])   . Coronary artery disease 11/24/2011  . Diastolic dysfunction 9/0/2409  . Dyspnea    W/ EXERTION   . Dysrhythmia    AFIB     . First degree AV block 10/12/2018   Noted on EKG  . Gallstones   . Hyperglycemia   . HYPERLIPIDEMIA 09/28/2006  . HYPERTENSION 09/28/2006  . HYPOTHYROIDISM, ACQUIRED NEC 09/28/2006   pt denies and doesn't know  . Long term current use of anticoagulant 04/08/2010  . Obese   . OBSTRUCTIVE SLEEP APNEA 01/06/2008   NOT USING    . Right knee DJD 07/09/2010  . S/P CABG x 1 11/26/2011   Right internal mammary artery to right coronary artery  . S/P Maze operation for atrial fibrillation 11/26/2011   complete biatrial lesion set with clipping of LA appendage   Past Surgical History:  Procedure Laterality Date  . ANTERIOR CERVICAL DECOMP/DISCECTOMY FUSION N/A 06/20/2016   Procedure: Anterior Cervical Discectomy Fusion - Cervical five-Cervical six - Cervical six-Cervical seven - Cervicla seven- Thoracic one;   Surgeon: Earnie Larsson, MD;  Location: [place];  Service: Neurosurgery;  Laterality: N/A;  . CARDIOVERSION     X4   . CORONARY ARTERY BYPASS GRAFT  11/26/2011   Procedure: CORONARY ARTERY BYPASS GRAFTING (CABG);  Surgeon: Rexene Alberts, MD;  Location: [place];  Service: Open Heart Surgery;  Laterality: N/A;  coronary artery bypass on pump times one utilizing the right internal mammary artery, transesophageal echocardiogram   . HAND SURGERY Left   . LEFT HEART CATHETERIZATION WITH CORONARY ANGIOGRAM N/A 11/24/2011   Procedure: LEFT HEART CATHETERIZATION WITH CORONARY ANGIOGRAM;  Surgeon: Peter M Martinique, MD;  Location[facility] LAB;  Service: Cardiovascular;  Laterality: N/A;  . LOOP RECORDER IMPLANT N/A 07/06/2012   Procedure: LOOP RECORDER IMPLANT;  Surgeon: Thompson Grayer, MD;  Location: [facility] CATH LAB;  Service: Cardiovascular;  Laterality: N/A;  . MAZE  11/26/2011   Procedure: MAZE;  Surgeon: Rexene Alberts, MD;  Location: [place];  Service: Open Heart Surgery;  Laterality: N/A;  C[place]omaze   . Radiofrequency Ablation for atrial fibrillation  12/30/07 and 06/05/09   afib ablation x 2 by JA  . Radiofrequency ablation for atrial flutter  2005   CTI  . TOTAL KNEE ARTHROPLASTY Right 04/05/2019   Procedure: RIGHT TOTAL KNEE ARTHROPLASTY;  Surgeon: Melrose Nakayama, MD;  Location: [facility];  Service: Orthopedics;  Laterality: Right;    reports that he quit smoking about  24 years ago. His smoking use included cigarettes. He has a 39.00 pack-year smoking history. He has never used smokeless tobacco. He reports that he does not drink alcohol and does not use drugs. family history includes Atrial fibrillation in his mother; Cancer in his paternal grandmother; Dementia in his father and paternal grandfather; Hypertension in his father; Stroke in his mother. Allergies  Allergen Reactions  . Zolpidem Tartrate Other (See Comments)    Extremely drowsy the next day   Current Outpatient Medications on File Prior to Visit    Medication Sig Dispense Refill  . acetaminophen (TYLENOL) 500 MG tablet Take 1,000 mg by mouth every 6 (six) hours as needed for mild pain.     Marland Kitchen ALPRAZolam (XANAX) 0.5 MG tablet TAKE 1 TABLET BY MOUTH TWICE A DAY AS NEEDED FOR ANXIETY 180 tablet 1  . budesonide-formoterol (SYMBICORT) 160-4.5 MCG/ACT inhaler TAKE 2 PUFFS BY MOUTH TWICE A DAY 30.6 Inhaler 4  . cholecalciferol (VITAMIN D) 1000 units tablet Take 1,000 Units by mouth daily.    Marland Kitchen docusate sodium (COLACE) 100 MG capsule Take 200 mg by mouth daily as needed for mild constipation.     Marland Kitchen ELIQUIS 5 MG TABS tablet TAKE 1 TABLET BY MOUTH TWICE A DAY 180 tablet 1  . furosemide (LASIX) 20 MG tablet Take 1 tablet (20 mg total) by mouth daily as needed. 90 tablet 1  . gabapentin (NEURONTIN) 100 MG capsule TAKE 1 CAPSULE BY MOUTH THREE TIMES A DAY 270 capsule 2  . lovastatin (MEVACOR) 40 MG tablet TAKE 2 TABLETS (80 MG TOTAL) BY MOUTH EVERY EVENING. 180 tablet 2  . metoprolol succinate (TOPROL-XL) 50 MG 24 hr tablet TAKE 1 AND 1/2 TABLETS BY MOUTH DAILY 135 tablet 1  . Multiple Vitamin (MULTIVITAMIN WITH MINERALS) TABS tablet Take 1 tablet by mouth daily.     . Olmesartan-amLODIPine-HCTZ 40-5-12.5 MG TABS TAKE 1 TABLET BY MOUTH EVERY DAY 90 tablet 1  . PROAIR HFA 108 (90 Base) MCG/ACT inhaler TAKE 2 PUFFS BY MOUTH EVERY 6 HOURS AS NEEDED FOR WHEEZE OR SHORTNESS OF BREATH (Patient taking differently: Inhale 2 puffs into the lungs every 6 (six) hours as needed for wheezing or shortness of breath. ) 8.5 Inhaler 11  . temazepam (RESTORIL) 30 MG capsule TAKE 1 CAPSULE (30 MG TOTAL) BY MOUTH AT BEDTIME AS NEEDED. FOR SLEEP 90 capsule 1  . tiZANidine (ZANAFLEX) 4 MG tablet Take 1 tablet (4 mg total) by mouth every 6 (six) hours as needed for muscle spasms. 40 tablet 1  . FLUZONE QUADRIVALENT 0.5 ML injection     . spironolactone (ALDACTONE) 25 MG tablet Take 0.5 tablets (12.5 mg total) by mouth at bedtime. 90 tablet 2   No current facility-administered  medications on file prior to visit.   Review of Systems All otherwise neg per pt    Objective:   Physical Exam BP 118/70 (BP Location: Left Arm, Patient Position: Sitting, Cuff Size: Large)   Pulse 67   Temp 98.1 F (36.7 C) (Oral)   Ht 5\' 6"  (1.676 m)   Wt 219 lb (99.3 kg)   SpO2 98%   BMI 35.35 kg/m  VS noted,  Constitutional: Pt appears in NAD HENT: Head: NCAT.  Right Ear: External ear normal.  Left Ear: External ear normal.  Eyes: . Pupils are equal, round, and reactive to light. Conjunctivae and EOM are normal Nose: without d/c or deformity Neck: Neck supple. Gross normal ROM Cardiovascular: Normal rate and regular rhythm.  Pulmonary/Chest: Effort normal and breath sounds without rales or wheezing.  Abd:  Soft, NT, ND, + BS, no organomegaly Neurological: Pt is alert. At baseline orientation, motor grossly intact Skin: Skin is warm. No rashes, other new lesions, no LE edema Psychiatric: Pt behavior is normal without agitation  All otherwise neg per pt Lab Results  Component Value Date   WBC 7.9 03/31/2019   HGB 14.9 03/31/2019   HCT 44.4 03/31/2019   PLT 201 03/31/2019   GLUCOSE 109 (H) 01/09/2020   CHOL 142 01/09/2020   TRIG 100.0 01/09/2020   HDL 40.70 01/09/2020   LDLCALC 81 01/09/2020   ALT 28 01/09/2020   AST 29 01/09/2020   NA 141 01/09/2020   K 4.4 01/09/2020   CL 104 01/09/2020   CREATININE 1.52 (H) 01/09/2020   BUN 23 01/09/2020   CO2 31 01/09/2020   TSH 3.88 01/09/2020   PSA 0.89 03/14/2019   INR 1.0 03/31/2019   HGBA1C 5.5 01/09/2020      Assessment & Plan:

## 2020-01-11 LAB — PTH, INTACT AND CALCIUM
Calcium: 9.3 mg/dL (ref 8.6–10.3)
PTH: 22 pg/mL (ref 14–64)

## 2020-01-13 ENCOUNTER — Encounter: Payer: Self-pay | Admitting: Internal Medicine

## 2020-01-15 ENCOUNTER — Encounter: Payer: Self-pay | Admitting: Internal Medicine

## 2020-01-15 NOTE — Assessment & Plan Note (Signed)
stable overall by history and exam, recent data reviewed with pt, and pt to continue medical treatment as before,  to f/u any worsening symptoms or concerns  

## 2020-01-15 NOTE — Assessment & Plan Note (Addendum)
stable overall by history and exam, recent data reviewed with pt, and pt to continue medical treatment as before,  to f/u any worsening symptoms or concerns  I spent 41 minutes in preparing to see the patient by review of recent labs, imaging and procedures, obtaining and reviewing separately obtained history, communicating with the patient and family or caregiver, ordering medications, tests or procedures, and documenting clinical information in the EHR including the differential Dx, treatment, and any further evaluation and other management of knee pain, hypothyroidism, hld, hyperglycemia, htn, ckd,

## 2020-01-15 NOTE — Assessment & Plan Note (Signed)
To f/u ortho as planned

## 2020-01-16 DIAGNOSIS — M25561 Pain in right knee: Secondary | ICD-10-CM | POA: Diagnosis not present

## 2020-01-16 DIAGNOSIS — M25551 Pain in right hip: Secondary | ICD-10-CM | POA: Diagnosis not present

## 2020-01-16 DIAGNOSIS — G4733 Obstructive sleep apnea (adult) (pediatric): Secondary | ICD-10-CM | POA: Diagnosis not present

## 2020-01-16 DIAGNOSIS — Z96651 Presence of right artificial knee joint: Secondary | ICD-10-CM | POA: Diagnosis not present

## 2020-01-17 ENCOUNTER — Other Ambulatory Visit: Payer: Self-pay | Admitting: Internal Medicine

## 2020-02-21 ENCOUNTER — Telehealth (INDEPENDENT_AMBULATORY_CARE_PROVIDER_SITE_OTHER): Payer: PPO | Admitting: Family Medicine

## 2020-02-21 VITALS — Ht 66.0 in

## 2020-02-21 DIAGNOSIS — Z20822 Contact with and (suspected) exposure to covid-19: Secondary | ICD-10-CM | POA: Diagnosis not present

## 2020-02-21 DIAGNOSIS — R197 Diarrhea, unspecified: Secondary | ICD-10-CM

## 2020-02-21 DIAGNOSIS — J029 Acute pharyngitis, unspecified: Secondary | ICD-10-CM | POA: Diagnosis not present

## 2020-02-21 DIAGNOSIS — Z03818 Encounter for observation for suspected exposure to other biological agents ruled out: Secondary | ICD-10-CM | POA: Diagnosis not present

## 2020-02-21 NOTE — Progress Notes (Signed)
   Scott [name]. is a 64 y.o. male who presents today for a telephone visit.  Assessment/Plan:  Diarrhea No red flags.  Possibly related to food he ingested yesterday evening.  Given that symptoms have resolved and does not have any other red flags we will continue with watchful waiting  Sore Throat No red flags or any other signs of infection.  Symptoms are improving.  He thinks it is due to CPAP machine.  This is highly possible.  Given his lack of other symptoms and the fact that symptoms are already improving do not think he needs to get Covid tested at this point.  We will continue with watchful waiting.  Discussed reasons to return to care.    Subjective:  HPI:  Patient here for telephone visit.  Had 1 episode of diarrhea this morning.  But none since.  Had pork chop for dinner yesterday.  No known sick contacts.  No nausea or vomiting.  He also had a sore throat this morning but thinks he was due to his CPAP.  No fevers or chills.  No cough.  He has history of COPD and CHF.  He has shortness of breath at baseline but none worse than his usual symptoms.  No nasal congestion.  Symptoms have improved.       Objective/Observations   NAD  Telephone Visit   I connected with Scott [name]. on 02/21/20 at 10:40 AM EST via telephone and verified that I am speaking with the correct person using two identifiers. I discussed the limitations of evaluation and management by telemedicine and the availability of in person appointments. The patient expressed understanding and agreed to proceed.   Patient location: Home Provider location: [facility] [employer] [facility] Persons participating in the virtual visit: Myself and patient  A total of 11 minutes were spent on medical discussion.      Katina Degree. Jimmey Ralph, MD 02/21/2020 10:58 AM

## 2020-03-07 ENCOUNTER — Other Ambulatory Visit: Payer: Self-pay | Admitting: Internal Medicine

## 2020-03-07 NOTE — Telephone Encounter (Signed)
Please refill as per office routine med refill policy (all routine meds refilled for 3 mo or monthly per pt preference up to one year from last visit, then month to month grace period for 3 mo, then further med refills will have to be denied)  

## 2020-03-08 ENCOUNTER — Other Ambulatory Visit: Payer: Self-pay

## 2020-03-08 ENCOUNTER — Encounter: Payer: Self-pay | Admitting: Cardiology

## 2020-03-08 ENCOUNTER — Ambulatory Visit (INDEPENDENT_AMBULATORY_CARE_PROVIDER_SITE_OTHER): Payer: PPO | Admitting: Cardiology

## 2020-03-08 ENCOUNTER — Telehealth: Payer: Self-pay | Admitting: *Deleted

## 2020-03-08 VITALS — BP 112/60 | HR 69 | Ht 66.0 in | Wt 221.2 lb

## 2020-03-08 DIAGNOSIS — E669 Obesity, unspecified: Secondary | ICD-10-CM | POA: Diagnosis not present

## 2020-03-08 DIAGNOSIS — G4733 Obstructive sleep apnea (adult) (pediatric): Secondary | ICD-10-CM | POA: Diagnosis not present

## 2020-03-08 DIAGNOSIS — I10 Essential (primary) hypertension: Secondary | ICD-10-CM

## 2020-03-08 NOTE — Progress Notes (Signed)
Date:  03/08/2020   ID:  [name], DOB 05-Mar-1955, MRN [id number]   PCP:  [name], MD  Cardiologist:  Cristopher Peru, MD Electrophysiologist:  None   Chief Complaint:  OSA  History of Present Illness:    Scott [name]. is a 65 y.o. male  with a hx of PAF, CHF and CAD who was referred for sleep study due to afib.  He underwent Sleep study showing severe OSA with an AHI of 74/hr and O2 sats as low as 83% with loud snoring.  He underwent CPAP titration to 9cm H2O.    He is doing well with his CPAP device and thinks that he has gotten used to it.  he tolerates the mask and feels the pressure is adequate.  Since going on CPAP he feels rested in the am but still occasionally takes a nap.   He denies any significant mouth or nasal dryness or nasal congestion.  He does not think that he snores.    Prior CV studies:   The following studies were reviewed today:  PAP compliance download  Past Medical History:  Diagnosis Date  . ALLERGIC RHINITIS 01/14/2007  . ANXIETY 09/28/2006  . Asbestos exposure   . Atrial fibrillation ([place]) 09/28/2006   s/p afib ablation x 2  . CHF (congestive heart failure) ([place])   . CKD (chronic kidney disease), stage III ([place])    patient denies  . COPD (chronic obstructive pulmonary disease) ([place])   . Coronary artery disease 11/24/2011  . Diastolic dysfunction 03/04/2991  . Dyspnea    W/ EXERTION   . Dysrhythmia    AFIB     . First degree AV block 10/12/2018   Noted on EKG  . Gallstones   . Hyperglycemia   . HYPERLIPIDEMIA 09/28/2006  . HYPERTENSION 09/28/2006  . HYPOTHYROIDISM, ACQUIRED NEC 09/28/2006   pt denies and doesn't know  . Long term current use of anticoagulant 04/08/2010  . Obese   . OBSTRUCTIVE SLEEP APNEA 01/06/2008   NOT USING    . Right knee DJD 07/09/2010  . S/P CABG x 1 11/26/2011   Right internal mammary artery to right coronary artery  . S/P Maze operation for atrial fibrillation 11/26/2011   complete biatrial lesion set with  clipping of LA appendage   Past Surgical History:  Procedure Laterality Date  . ANTERIOR CERVICAL DECOMP/DISCECTOMY FUSION N/A 06/20/2016   Procedure: Anterior Cervical Discectomy Fusion - Cervical five-Cervical six - Cervical six-Cervical seven - Cervicla seven- Thoracic one;  Surgeon: Earnie Larsson, MD;  Location: [place];  Service: Neurosurgery;  Laterality: N/A;  . CARDIOVERSION     X4   . CORONARY ARTERY BYPASS GRAFT  11/26/2011   Procedure: CORONARY ARTERY BYPASS GRAFTING (CABG);  Surgeon: Rexene Alberts, MD;  Location: [place];  Service: Open Heart Surgery;  Laterality: N/A;  coronary artery bypass on pump times one utilizing the right internal mammary artery, transesophageal echocardiogram   . HAND SURGERY Left   . LEFT HEART CATHETERIZATION WITH CORONARY ANGIOGRAM N/A 11/24/2011   Procedure: LEFT HEART CATHETERIZATION WITH CORONARY ANGIOGRAM;  Surgeon: Peter M Martinique, MD;  [facility];  Service: Cardiovascular;  Laterality: N/A;  . LOOP RECORDER IMPLANT N/A 07/06/2012   Procedure: LOOP RECORDER IMPLANT;  Surgeon: Thompson Grayer, MD;  Location: [facility] CATH LAB;  Service: Cardiovascular;  Laterality: N/A;  . MAZE  11/26/2011   Procedure: MAZE;  Surgeon: Rexene Alberts, MD;  Location: [place];  Service:  Open Heart Surgery;  Laterality: N/A;  Cryomaze   . Radiofrequency Ablation for atrial fibrillation  12/30/07 and 06/05/09   afib ablation x 2 by JA  . Radiofrequency ablation for atrial flutter  2005   CTI  . TOTAL KNEE ARTHROPLASTY Right 04/05/2019   Procedure: RIGHT TOTAL KNEE ARTHROPLASTY;  Surgeon: Melrose Nakayama, MD;  Location: [facility];  Service: Orthopedics;  Laterality: Right;     Current Meds  Medication Sig  . acetaminophen (TYLENOL) 500 MG tablet Take 1,000 mg by mouth every 6 (six) hours as needed for mild pain.   Marland Kitchen ALPRAZolam (XANAX) 0.5 MG tablet TAKE 1 TABLET BY MOUTH TWICE A DAY AS NEEDED FOR ANXIETY  . budesonide-formoterol (SYMBICORT) 160-4.5 MCG/ACT inhaler TAKE 2 PUFFS BY  MOUTH TWICE A DAY  . cholecalciferol (VITAMIN D) 1000 units tablet Take 1,000 Units by mouth daily.  Marland Kitchen docusate sodium (COLACE) 100 MG capsule Take 200 mg by mouth daily as needed for mild constipation.   Marland Kitchen ELIQUIS 5 MG TABS tablet TAKE 1 TABLET BY MOUTH TWICE A DAY  . FLUZONE QUADRIVALENT 0.5 ML injection   . furosemide (LASIX) 20 MG tablet Take 1 tablet (20 mg total) by mouth daily as needed.  . gabapentin (NEURONTIN) 100 MG capsule TAKE 1 CAPSULE BY MOUTH THREE TIMES A DAY  . lovastatin (MEVACOR) 40 MG tablet TAKE 2 TABLETS (80 MG TOTAL) BY MOUTH EVERY EVENING.  . metoprolol succinate (TOPROL-XL) 50 MG 24 hr tablet TAKE 1 AND 1/2 TABLETS BY MOUTH DAILY  . Multiple Vitamin (MULTIVITAMIN WITH MINERALS) TABS tablet Take 1 tablet by mouth daily.   . Olmesartan-amLODIPine-HCTZ 40-5-12.5 MG TABS TAKE 1 TABLET BY MOUTH EVERY DAY  . PROAIR HFA 108 (90 Base) MCG/ACT inhaler TAKE 2 PUFFS BY MOUTH EVERY 6 HOURS AS NEEDED FOR WHEEZE OR SHORTNESS OF BREATH (Patient taking differently: Inhale 2 puffs into the lungs every 6 (six) hours as needed for wheezing or shortness of breath.)  . temazepam (RESTORIL) 30 MG capsule TAKE 1 CAPSULE (30 MG TOTAL) BY MOUTH AT BEDTIME AS NEEDED. FOR SLEEP  . traMADol (ULTRAM) 50 MG tablet Take 1 tablet (50 mg total) by mouth every 6 (six) hours as needed.     Allergies:   Zolpidem tartrate   Social History   Tobacco Use  . Smoking status: Former Smoker    Packs/day: 1.50    Years: 26.00    Pack years: 39.00    Types: Cigarettes    Quit date: 04/06/1995    Years since quitting: 24.9  . Smokeless tobacco: Never Used  Vaping Use  . Vaping Use: Never used  Substance Use Topics  . Alcohol use: No    Alcohol/week: 0.0 standard drinks  . Drug use: No     Family Hx: The patient's family history includes Atrial fibrillation in his mother; Cancer in his paternal grandmother; Dementia in his father and paternal grandfather; Hypertension in his father; Stroke in his  mother.  ROS:   Please see the history of present illness.     All other systems reviewed and are negative.   Labs/Other Tests and Data Reviewed:    Recent Labs: 03/31/2019: Hemoglobin 14.9; Platelets 201 01/09/2020: ALT 28; BUN 23; Creatinine, Ser 1.52; Potassium 4.4; Sodium 141; TSH 3.88   Recent Lipid Panel Lab Results  Component Value Date/Time   CHOL 142 01/09/2020 09:06 AM   TRIG 100.0 01/09/2020 09:06 AM   HDL 40.70 01/09/2020 09:06 AM   CHOLHDL 3 01/09/2020 09:06 AM  [place] 81 01/09/2020 09:06 AM    Wt Readings from Last 3 Encounters:  03/08/20 221 lb 3.2 oz (100.3 kg)  01/09/20 219 lb (99.3 kg)  05/16/19 198 lb 12.8 oz (90.2 kg)     Objective:    Vital Signs:  BP 112/60   Pulse 69   Ht 5\' 6"  (1.676 m)   Wt 221 lb 3.2 oz (100.3 kg)   SpO2 96%   BMI 35.70 kg/m    GEN: Well nourished, well developed in no acute distress HEENT: Normal NECK: No JVD; No carotid bruits LYMPHATICS: No lymphadenopathy CARDIAC:RRR, no murmurs, rubs, gallops RESPIRATORY:  Clear to auscultation without rales, wheezing or rhonchi  ABDOMEN: Soft, non-tender, non-distended MUSCULOSKELETAL:  No edema; No deformity  SKIN: Warm and dry NEUROLOGIC:  Alert and oriented x 3 PSYCHIATRIC:  Normal affect    ASSESSMENT & PLAN:    1.  OSA -  The patient is tolerating PAP therapy well without any problems. The PAP download was reviewed today and showed an AHI of 10.6/hr on 9 cm H2O with 100% compliance in using more than 4 hours nightly.  The patient has been using and benefiting from PAP use and will continue to benefit from therapy.  -his AHI is too high so I will change to auto CPAP from 4 to 18cm H2O and get a download in 4 weeks  2 . HTN -BP is well controlled on exam today -continue Toprol XL 75mg  daily, Olmesartan-amlodipine-HCTZ 40-5-12.5mg  daily and spiro 12.5mg  daily. -SCr stable at 1.52 and K+ 4.4 in Nov 2021  3.  Morbid Obesity -I have encouraged him to get into a routine  exercise program and cut back on carbs and portions.  -he is walking his dog on the weekends up to 6-7 miles -I will refer him to [facility] at [facility]   Medication Adjustments/Labs and Tests Ordered: Current medicines are reviewed at length with the patient today.  Concerns regarding medicines are outlined above.  Tests Ordered: No orders of the defined types were placed in this encounter.  Medication Changes: No orders of the defined types were placed in this encounter.   Disposition:  Follow up in 1 year(s)  Signed, Fransico Him, MD  03/08/2020 9:20 AM    [facility]

## 2020-03-08 NOTE — Telephone Encounter (Signed)
-----   Message from Sueanne Margarita, MD sent at 03/08/2020  9:26 AM EST ----- change to auto CPAP from 4 to 18cm H2O and get a download in 4 weeks

## 2020-03-08 NOTE — Patient Instructions (Addendum)
Medication Instructions:  Your physician recommends that you continue on your current medications as directed. Please refer to the Current Medication list given to you today.  *If you need a refill on your cardiac medications before your next appointment, please call your pharmacy*  Follow-Up: At [facility], you and your health needs are our priority.  As part of our continuing mission to provide you with exceptional heart care, we have created designated Provider Care Teams.  These Care Teams include your primary Cardiologist (physician) and Advanced Practice Providers (APPs -  Physician Assistants and Nurse Practitioners) who all work together to provide you with the care you need, when you need it.  Your next appointment:   1 year(s)  The format for your next appointment:   In Person  Provider:   Fransico Him, MD   Other Instructions You have been referred to the [facility]. They will be in contact with you to set up an appointment.

## 2020-03-08 NOTE — Addendum Note (Signed)
Addended by: Antonieta Iba on: 03/08/2020 09:31 AM   Modules accepted: Orders

## 2020-03-08 NOTE — Telephone Encounter (Signed)
Order placed to [facility]  

## 2020-03-27 ENCOUNTER — Other Ambulatory Visit: Payer: Self-pay | Admitting: Internal Medicine

## 2020-03-27 NOTE — Telephone Encounter (Signed)
Please refill as per office routine med refill policy (all routine meds refilled for 3 mo or monthly per pt preference up to one year from last visit, then month to month grace period for 3 mo, then further med refills will have to be denied)  

## 2020-04-02 ENCOUNTER — Ambulatory Visit: Payer: PPO | Admitting: Internal Medicine

## 2020-04-05 ENCOUNTER — Other Ambulatory Visit: Payer: Self-pay | Admitting: Internal Medicine

## 2020-04-12 ENCOUNTER — Encounter: Payer: Self-pay | Admitting: Internal Medicine

## 2020-04-12 ENCOUNTER — Telehealth: Payer: Self-pay

## 2020-04-12 ENCOUNTER — Ambulatory Visit (INDEPENDENT_AMBULATORY_CARE_PROVIDER_SITE_OTHER): Payer: PPO | Admitting: Internal Medicine

## 2020-04-12 VITALS — BP 142/72 | HR 75 | Ht 66.0 in | Wt 214.0 lb

## 2020-04-12 DIAGNOSIS — Z1211 Encounter for screening for malignant neoplasm of colon: Secondary | ICD-10-CM

## 2020-04-12 DIAGNOSIS — K625 Hemorrhage of anus and rectum: Secondary | ICD-10-CM

## 2020-04-12 DIAGNOSIS — Z7901 Long term (current) use of anticoagulants: Secondary | ICD-10-CM | POA: Diagnosis not present

## 2020-04-12 MED ORDER — SUTAB 1479-225-188 MG PO TABS: 1.0000 | ORAL_TABLET | Freq: Once | ORAL | 0 refills | Status: AC

## 2020-04-12 NOTE — Telephone Encounter (Signed)
[facility] Pre-operative Risk Assessment     Request for surgical clearance:     Endoscopy Procedure  What type of surgery is being performed?     Colonoscopy  When is this surgery scheduled?     05/03/2020  What type of clearance is required ?   Pharmacy  Are there any medications that need to be held prior to surgery and how long? Eliquis - 2 days  Practice name and name of physician performing surgery?      [facility]  What is your office phone and fax number?      Phone- [phone]  [phone]  Anesthesia type (None, local, MAC, general) ?       MAC

## 2020-04-12 NOTE — Telephone Encounter (Signed)
Patient with diagnosis of afib on Eliquis for anticoagulation.    Procedure: colonscopy Date of procedure: 05/03/2020  CHA2DS2-VASc Score = 4  This indicates a 4.8% annual risk of stroke. The patient's score is based upon: CHF History: Yes HTN History: Yes Diabetes History: No Stroke History: No Vascular Disease History: Yes Age Score: 1 Gender Score: 0     CrCl 52.8 ml/min  Per office protocol, patient can hold Eliquis for 2 days prior to procedure.

## 2020-04-12 NOTE — Patient Instructions (Signed)
If you are age 65 or older, your body mass index should be between 23-30. Your Body mass index is 34.54 kg/m. If this is out of the aforementioned range listed, please consider follow up with your Primary Care Provider.  If you are age 29 or younger, your body mass index should be between 19-25. Your Body mass index is 34.54 kg/m. If this is out of the aformentioned range listed, please consider follow up with your Primary Care Provider.   You have been scheduled for a colonoscopy. Please follow written instructions given to you at your visit today.  Please pick up your prep supplies at the pharmacy within the next 1-3 days. If you use inhalers (even only as needed), please bring them with you on the day of your procedure.

## 2020-04-12 NOTE — Telephone Encounter (Signed)
   Primary Cardiologist: No primary care provider on file.  Chart reviewed as part of pre-operative protocol coverage. Given past medical history and time since last visit, based on ACC/AHA guidelines, Scott [name]. would be at acceptable risk for the planned procedure without further cardiovascular testing.   Patient with diagnosis of afib on Eliquis for anticoagulation.    Procedure: colonscopy Date of procedure: 05/03/2020  CHA2DS2-VASc Score = 4  This indicates a 4.8% annual risk of stroke. The patient's score is based upon: CHF History: Yes HTN History: Yes Diabetes History: No Stroke History: No Vascular Disease History: Yes Age Score: 1 Gender Score: 0   CrCl 52.8 ml/min  Per office protocol, patient can hold Eliquis for 2 days prior to procedure.    I will route this recommendation to the requesting party via Epic fax function and remove from pre-op pool.  Please call with questions.  Kathyrn Drown, NP 04/12/2020, 4:37 PM

## 2020-04-16 ENCOUNTER — Encounter: Payer: Self-pay | Admitting: Internal Medicine

## 2020-04-16 NOTE — Progress Notes (Signed)
HISTORY OF PRESENT ILLNESS:  Scott [name]. is a 65 y.o. male, employee at the [employer], with multiple significant medical problems including atrial fibrillation on chronic anticoagulation, congestive heart failure, COPD, hypertension, hyperlipidemia, sleep apnea, and coronary artery disease status post CABG.  Previous ejection fraction 55 to 60%.  Last seen in this office 2019 regarding colon cancer screening.  Discussed in detail multiple options.  Patient did not decide.  However at this time he wishes to proceed with optical colonoscopy.  He is on chronic Eliquis therapy.  Monitored by Dr. Lovena Le.  His GI review of systems is essentially normal.  He occasionally sees some minor bleeding on the tissue paper.  Review of blood work does show renal insufficiency with GFR 48.  He has not had prior colonoscopy.  He has completed his COVID vaccination series and booster.  REVIEW OF SYSTEMS:  All non-GI ROS negative unless otherwise stated in the HPI except for headaches, hearing problems, arthritis, back pain, fatigue, night sweats, shortness of breath, ankle swelling  Past Medical History:  Diagnosis Date  . ALLERGIC RHINITIS 01/14/2007  . ANXIETY 09/28/2006  . Asbestos exposure   . Atrial fibrillation ([place]) 09/28/2006   s/p afib ablation x 2  . CHF (congestive heart failure) ([place])   . CKD (chronic kidney disease), stage III ([place])    patient denies  . COPD (chronic obstructive pulmonary disease) ([place])   . Coronary artery disease 11/24/2011  . Diastolic dysfunction 06/02/1854  . Dyspnea    W/ EXERTION   . Dysrhythmia    AFIB     . First degree AV block 10/12/2018   Noted on EKG  . Gallstones   . Hyperglycemia   . HYPERLIPIDEMIA 09/28/2006  . HYPERTENSION 09/28/2006  . HYPOTHYROIDISM, ACQUIRED NEC 09/28/2006   pt denies and doesn't know  . Long term current use of anticoagulant 04/08/2010  . Obese   . OBSTRUCTIVE SLEEP APNEA 01/06/2008   NOT USING    . Right knee DJD 07/09/2010  . S/P  CABG x 1 11/26/2011   Right internal mammary artery to right coronary artery  . S/P Maze operation for atrial fibrillation 11/26/2011   complete biatrial lesion set with clipping of LA appendage    Past Surgical History:  Procedure Laterality Date  . ANTERIOR CERVICAL DECOMP/DISCECTOMY FUSION N/A 06/20/2016   Procedure: Anterior Cervical Discectomy Fusion - Cervical five-Cervical six - Cervical six-Cervical seven - Cervicla seven- Thoracic one;  Surgeon: Earnie Larsson, MD;  Location: [place];  Service: Neurosurgery;  Laterality: N/A;  . CARDIOVERSION     X4   . CORONARY ARTERY BYPASS GRAFT  11/26/2011   Procedure: CORONARY ARTERY BYPASS GRAFTING (CABG);  Surgeon: Rexene Alberts, MD;  Location: [place];  Service: Open Heart Surgery;  Laterality: N/A;  coronary artery bypass on pump times one utilizing the right internal mammary artery, transesophageal echocardiogram   . HAND SURGERY Left   . LEFT HEART CATHETERIZATION WITH CORONARY ANGIOGRAM N/A 11/24/2011   Procedure: LEFT HEART CATHETERIZATION WITH CORONARY ANGIOGRAM;  Surgeon: Peter M Martinique, MD;  [facility] C[facility] LAB;  Service: Cardiovascular;  Laterality: N/A;  . LOOP RECORDER IMPLANT N/A 07/06/2012   Procedure: LOOP RECORDER IMPLANT;  Surgeon: Thompson Grayer, MD;  [facility]: [facility] CATH LAB;  Service: Cardiovascular;  Laterality: N/A;  . MAZE  11/26/2011   Procedure: MAZE;  Surgeon: Rexene Alberts, MD;  Location: [place];  Service: Open Heart Surgery;  Laterality: N/A;  Cryomaze   . Radiofrequency  Ablation for atrial fibrillation  12/30/07 and 06/05/09   afib ablation x 2 by JA  . Radiofrequency ablation for atrial flutter  2005   CTI  . TOTAL KNEE ARTHROPLASTY Right 04/05/2019   Procedure: RIGHT TOTAL KNEE ARTHROPLASTY;  Surgeon: Melrose Nakayama, MD;  [facility];  Service: Orthopedics;  Laterality: Right;    Social History Needham Biggins.  reports that he quit smoking about 25 years ago. His smoking use included cigarettes. He has a 39.00  pack-year smoking history. He has never used smokeless tobacco. He reports that he does not drink alcohol and does not use drugs.  family history includes Atrial fibrillation in his mother; Cancer in his paternal grandmother; Dementia in his father and paternal grandfather; Hypertension in his father; Stroke in his mother.  Allergies  Allergen Reactions  . Zolpidem Tartrate Other (See Comments)    Extremely drowsy the next day       PHYSICAL EXAMINATION: Vital signs: BP (!) 142/72   Pulse 75   Ht 5\' 6"  (1.676 m)   Wt 214 lb (97.1 kg)   BMI 34.54 kg/m   Constitutional: generally well-appearing, no acute distress Psychiatric: alert and oriented x3, cooperative Eyes: extraocular movements intact, anicteric, conjunctiva pink Mouth: oral pharynx moist, no lesions Neck: supple no lymphadenopathy Cardiovascular: heart regular rate and rhythm, no murmur Lungs: clear to auscultation bilaterally Abdomen: soft, nontender, nondistended, no obvious ascites, no peritoneal signs, normal bowel sounds, no organomegaly Rectal: Deferred to colonoscopy Extremities: no clubbing or cyanosis.  1+ lower extremity edema bilaterally Skin: no lesions on visible extremities Neuro: No focal deficits.  Cranial nerves intact  ASSESSMENT:  1.  Colon cancer screening.  Baseline risk for neoplasia.  High risk for procedure given his comorbidities and the need to address anticoagulation 2.  History of atrial fibrillation on chronic Eliquis.  Follow-up with Dr. Lovena Le 3.  Renal insufficiency.  GFR 48 4.  Minor rectal bleeding 5.  General medical problems.  Stable  PLAN:  1.  Colonoscopy.  The patient is HIGH RISK given his comorbidities and the need to address his anticoagulation therapy.The nature of the procedure, as well as the risks, benefits, and alternatives were carefully and thoroughly reviewed with the patient. Ample time for discussion and questions allowed. The patient understood, was satisfied, and  agreed to proceed. 2.  Hold anticoagulation 3 days prior to the procedure (renal insufficiency).  Would anticipate resumption of anticoagulation immediately post procedure 3.  Ongoing general medical care with Dr. [name]

## 2020-04-20 NOTE — Telephone Encounter (Signed)
Lm on vm that patient could hold his Eliquis for 2 days prior to his procedure.  Asked for a return call to confirm

## 2020-04-23 ENCOUNTER — Other Ambulatory Visit: Payer: Self-pay | Admitting: Student

## 2020-04-23 DIAGNOSIS — M25561 Pain in right knee: Secondary | ICD-10-CM | POA: Diagnosis not present

## 2020-04-24 NOTE — Telephone Encounter (Signed)
Pt is wanting to inform he confirmed the message

## 2020-04-27 NOTE — Telephone Encounter (Signed)
noted 

## 2020-05-03 ENCOUNTER — Other Ambulatory Visit: Payer: Self-pay

## 2020-05-03 ENCOUNTER — Encounter: Payer: Self-pay | Admitting: Internal Medicine

## 2020-05-03 ENCOUNTER — Ambulatory Visit (AMBULATORY_SURGERY_CENTER): Payer: PPO | Admitting: Internal Medicine

## 2020-05-03 VITALS — BP 142/80 | HR 86 | Temp 98.6°F | Resp 27 | Ht 66.0 in | Wt 214.0 lb

## 2020-05-03 DIAGNOSIS — Z1211 Encounter for screening for malignant neoplasm of colon: Secondary | ICD-10-CM

## 2020-05-03 DIAGNOSIS — D122 Benign neoplasm of ascending colon: Secondary | ICD-10-CM | POA: Diagnosis not present

## 2020-05-03 MED ORDER — SODIUM CHLORIDE 0.9 % IV SOLN: 500.0000 mL | Freq: Once | INTRAVENOUS | Status: DC

## 2020-05-03 NOTE — Op Note (Signed)
[facility] Patient Name: Scott [name] Procedure Date: 05/03/2020 9:36 AM MRN: [id number] Endoscopist: Docia Chuck. Henrene Pastor , MD Age: 65 Referring MD:  Date of Birth: [birth year] Gender: Male Account #: [id number] Procedure:                Colonoscopy with cold snare polypectomy x 2 Indications:              Screening for colorectal malignant neoplasm Medicines:                Monitored Anesthesia Care Procedure:                Pre-Anesthesia Assessment:                           - Prior to the procedure, a History and Physical                            was performed, and patient medications and                            allergies were reviewed. The patient's tolerance of                            previous anesthesia was also reviewed. The risks                            and benefits of the procedure and the sedation                            options and risks were discussed with the patient.                            All questions were answered, and informed consent                            was obtained. Prior Anticoagulants: The patient has                            taken Eliquis (apixaban), last dose was 3 days                            prior to procedure. ASA Grade Assessment: III - A                            patient with severe systemic disease. After                            reviewing the risks and benefits, the patient was                            deemed in satisfactory condition to undergo the                            procedure.  After obtaining informed consent, the colonoscope                            was passed under direct vision. Throughout the                            procedure, the patient's blood pressure, pulse, and                            oxygen saturations were monitored continuously. The                            Olympus CF-HQ190L ([id number]) Colonoscope was                            introduced through the anus  and advanced to the the                            cecum, identified by appendiceal orifice and                            ileocecal valve. The ileocecal valve, appendiceal                            orifice, and rectum were photographed. The quality                            of the bowel preparation was excellent. The                            colonoscopy was performed without difficulty. The                            patient tolerated the procedure well. The bowel                            preparation used was SUPREP via split dose                            instruction. Scope In: 9:40:45 AM Scope Out: 9:53:51 AM Scope Withdrawal Time: 0 hours 10 minutes 30 seconds  Total Procedure Duration: 0 hours 13 minutes 6 seconds  Findings:                 Two polyps were found in the ascending colon. The                            polyps were 3 to 5 mm in size. These polyps were                            removed with a cold snare. Resection and retrieval                            were complete.  Multiple small and large-mouthed diverticula were                            found in the left colon.                           Internal hemorrhoids were found during                            retroflexion. The hemorrhoids were moderate.                           The exam was otherwise without abnormality on                            direct and retroflexion views. Complications:            No immediate complications. Estimated blood loss:                            None. Estimated Blood Loss:     Estimated blood loss: none. Impression:               - Two 3 to 5 mm polyps in the ascending colon,                            removed with a cold snare. Resected and retrieved.                           - Diverticulosis in the left colon.                           - Internal hemorrhoids.                           - The examination was otherwise normal on direct                             and retroflexion views. Recommendation:           - Repeat colonoscopy in 5 years for surveillance.                           - Resume Eliquis (apixaban) today at prior dose.                           - Patient has a contact number available for                            emergencies. The signs and symptoms of potential                            delayed complications were discussed with the                            patient. Return to normal activities tomorrow.  Written discharge instructions were provided to the                            patient.                           - Resume previous diet.                           - Continue present medications.                           - Await pathology results. Docia Chuck. Henrene Pastor, MD 05/03/2020 9:59:14 AM This report has been signed electronically.

## 2020-05-03 NOTE — Progress Notes (Signed)
Medical history reviewed with no changes noted. VS assessed by C.W 

## 2020-05-03 NOTE — Progress Notes (Signed)
pt tolerated well. VSS. awake and to recovery. Report given to RN.  

## 2020-05-03 NOTE — Progress Notes (Signed)
Called to room to assist during endoscopic procedure.  Patient ID and intended procedure confirmed with present staff. Received instructions for my participation in the procedure from the performing physician.  

## 2020-05-03 NOTE — Patient Instructions (Signed)
Handouts given for polyps, diverticulosis and hemorrhoids.  Await pathology results.  Resume your ELIQUIS today at prior dose.  YOU HAD AN ENDOSCOPIC PROCEDURE TODAY AT [facility]:   Refer to the procedure report that was given to you for any specific questions about what was found during the examination.  If the procedure report does not answer your questions, please call your gastroenterologist to clarify.  If you requested that your care partner not be given the details of your procedure findings, then the procedure report has been included in a sealed envelope for you to review at your convenience later.  YOU SHOULD EXPECT: Some feelings of bloating in the abdomen. Passage of more gas than usual.  Walking can help get rid of the air that was put into your GI tract during the procedure and reduce the bloating. If you had a lower endoscopy (such as a colonoscopy or flexible sigmoidoscopy) you may notice spotting of blood in your stool or on the toilet paper. If you underwent a bowel prep for your procedure, you may not have a normal bowel movement for a few days.  Please Note:  You might notice some irritation and congestion in your nose or some drainage.  This is from the oxygen used during your procedure.  There is no need for concern and it should clear up in a day or so.  SYMPTOMS TO REPORT IMMEDIATELY:   Following lower endoscopy (colonoscopy or flexible sigmoidoscopy):  Excessive amounts of blood in the stool  Significant tenderness or worsening of abdominal pains  Swelling of the abdomen that is new, acute  Fever of 100F or higher  For urgent or emergent issues, a gastroenterologist can be reached at any hour by calling [phone]. Do not use MyChart messaging for urgent concerns.    DIET:  We do recommend a small meal at first, but then you may proceed to your regular diet.  Drink plenty of fluids but you should avoid alcoholic beverages for 24  hours.  ACTIVITY:  You should plan to take it easy for the rest of today and you should NOT DRIVE or use heavy machinery until tomorrow (because of the sedation medicines used during the test).    FOLLOW UP: Our staff will call the number listed on your records 48-72 hours following your procedure to check on you and address any questions or concerns that you may have regarding the information given to you following your procedure. If we do not reach you, we will leave a message.  We will attempt to reach you two times.  During this call, we will ask if you have developed any symptoms of COVID 19. If you develop any symptoms (ie: fever, flu-like symptoms, shortness of breath, cough etc.) before then, please call [phone].  If you test positive for Covid 19 in the 2 weeks post procedure, please call and report this information to [place].    If any biopsies were taken you will be contacted by phone or by letter within the next 1-3 weeks.  Please call us at [phone] if you have not heard about the biopsies in 3 weeks.    SIGNATURES/CONFIDENTIALITY: You and/or your care partner have signed paperwork which will be entered into your electronic medical record.  These signatures attest to the fact that that the information above on your After Visit Summary has been reviewed and is understood.  Full responsibility of the confidentiality of this discharge information lies with you and/or your  care-partner.

## 2020-05-04 ENCOUNTER — Other Ambulatory Visit: Payer: Self-pay | Admitting: Internal Medicine

## 2020-05-04 DIAGNOSIS — I5032 Chronic diastolic (congestive) heart failure: Secondary | ICD-10-CM

## 2020-05-08 ENCOUNTER — Telehealth: Payer: Self-pay

## 2020-05-08 ENCOUNTER — Telehealth: Payer: Self-pay | Admitting: *Deleted

## 2020-05-08 NOTE — Telephone Encounter (Signed)
  Follow up Call-  Call back number 05/03/2020  Post procedure Call Back phone  # [phone]  Permission to leave phone message Yes  Some recent data might be hidden     Patient questions:  Do you have a fever, pain , or abdominal swelling? No. Pain Score  0 *  Have you tolerated food without any problems? Yes.    Have you been able to return to your normal activities? Yes.    Do you have any questions about your discharge instructions: Diet   No. Medications  No. Follow up visit  No.  Do you have questions or concerns about your Care? No.  Actions: * If pain score is 4 or above: No action needed, pain <4.  1. Have you developed a fever since your procedure? no  2.   Have you had an respiratory symptoms (SOB or cough) since your procedure? no  3.   Have you tested positive for COVID 19 since your procedure no  4.   Have you had any family members/close contacts diagnosed with the COVID 19 since your procedure?  no   If yes to any of these questions please route to Joylene John, RN and Joella Prince, RN

## 2020-05-08 NOTE — Telephone Encounter (Signed)
Follow up call made, left message. 

## 2020-05-09 ENCOUNTER — Encounter: Payer: Self-pay | Admitting: Internal Medicine

## 2020-05-18 DIAGNOSIS — Z96651 Presence of right artificial knee joint: Secondary | ICD-10-CM | POA: Diagnosis not present

## 2020-05-18 DIAGNOSIS — M25561 Pain in right knee: Secondary | ICD-10-CM | POA: Diagnosis not present

## 2020-05-29 ENCOUNTER — Encounter: Payer: Self-pay | Admitting: Internal Medicine

## 2020-05-29 ENCOUNTER — Other Ambulatory Visit: Payer: Self-pay

## 2020-05-29 ENCOUNTER — Ambulatory Visit (INDEPENDENT_AMBULATORY_CARE_PROVIDER_SITE_OTHER): Payer: PPO | Admitting: Internal Medicine

## 2020-05-29 VITALS — BP 116/66 | HR 70 | Ht 66.0 in | Wt 206.2 lb

## 2020-05-29 DIAGNOSIS — I480 Paroxysmal atrial fibrillation: Secondary | ICD-10-CM | POA: Diagnosis not present

## 2020-05-29 DIAGNOSIS — I5042 Chronic combined systolic (congestive) and diastolic (congestive) heart failure: Secondary | ICD-10-CM

## 2020-05-29 DIAGNOSIS — I10 Essential (primary) hypertension: Secondary | ICD-10-CM | POA: Diagnosis not present

## 2020-05-29 DIAGNOSIS — Z951 Presence of aortocoronary bypass graft: Secondary | ICD-10-CM

## 2020-05-29 NOTE — Patient Instructions (Signed)

## 2020-05-29 NOTE — Progress Notes (Signed)
HPI [name] returns today for followup. He is a pleasant 65 yo man with multiple medical problems including PAF, HTN, and remote tachy induced CM. In the interim, he has done well. He has gotten back to walking.  No chest pain or sob. No edema. His knee is still a little sore.  Allergies  Allergen Reactions  . Zolpidem Tartrate Other (See Comments)    Extremely drowsy the next day     Current Outpatient Medications  Medication Sig Dispense Refill  . acetaminophen (TYLENOL) 500 MG tablet Take 1,000 mg by mouth every 6 (six) hours as needed for mild pain.     Marland Kitchen ALPRAZolam (XANAX) 0.5 MG tablet TAKE 1 TABLET BY MOUTH TWICE A DAY AS NEEDED FOR ANXIETY 180 tablet 1  . budesonide-formoterol (SYMBICORT) 160-4.5 MCG/ACT inhaler TAKE 2 PUFFS BY MOUTH TWICE A DAY 30.6 Inhaler 4  . cholecalciferol (VITAMIN D) 1000 units tablet Take 1,000 Units by mouth daily.    Marland Kitchen docusate sodium (COLACE) 100 MG capsule Take 200 mg by mouth daily as needed for mild constipation.     Marland Kitchen ELIQUIS 5 MG TABS tablet TAKE 1 TABLET BY MOUTH TWICE A DAY 180 tablet 2  . furosemide (LASIX) 20 MG tablet TAKE 1 TABLET BY MOUTH EVERY DAY AS NEEDED 90 tablet 3  . gabapentin (NEURONTIN) 100 MG capsule TAKE 1 CAPSULE BY MOUTH THREE TIMES A DAY 270 capsule 2  . lovastatin (MEVACOR) 40 MG tablet TAKE 2 TABLETS (80 MG TOTAL) BY MOUTH EVERY EVENING. 180 tablet 2  . metoprolol succinate (TOPROL-XL) 50 MG 24 hr tablet TAKE 1 AND 1/2 TABLETS BY MOUTH DAILY 135 tablet 1  . Multiple Vitamin (MULTIVITAMIN WITH MINERALS) TABS tablet Take 1 tablet by mouth daily.     . Olmesartan-amLODIPine-HCTZ 40-5-12.5 MG TABS TAKE 1 TABLET BY MOUTH EVERY DAY 90 tablet 2  . PROAIR HFA 108 (90 Base) MCG/ACT inhaler TAKE 2 PUFFS BY MOUTH EVERY 6 HOURS AS NEEDED FOR WHEEZE OR SHORTNESS OF BREATH 8.5 Inhaler 11  . temazepam (RESTORIL) 30 MG capsule TAKE 1 CAPSULE (30 MG TOTAL) BY MOUTH AT BEDTIME AS NEEDED. FOR SLEEP 90 capsule 1  . traMADol (ULTRAM) 50  MG tablet Take 1 tablet (50 mg total) by mouth every 6 (six) hours as needed. 120 tablet 2  . spironolactone (ALDACTONE) 25 MG tablet Take 0.5 tablets (12.5 mg total) by mouth at bedtime. 90 tablet 2   No current facility-administered medications for this visit.     Past Medical History:  Diagnosis Date  . ALLERGIC RHINITIS 01/14/2007  . Allergy   . ANXIETY 09/28/2006  . Asbestos exposure   . Atrial fibrillation ([place]) 09/28/2006   s/p afib ablation x 2  . CHF (congestive heart failure) ([place])   . CKD (chronic kidney disease), stage III ([place])    patient denies  . COPD (chronic obstructive pulmonary disease) ([place])   . Coronary artery disease 11/24/2011  . Diastolic dysfunction 05/27/3534  . Dyspnea    W/ EXERTION   . Dysrhythmia    AFIB     . First degree AV block 10/12/2018   Noted on EKG  . Gallstones   . Hyperglycemia   . HYPERLIPIDEMIA 09/28/2006  . HYPERTENSION 09/28/2006  . HYPOTHYROIDISM, ACQUIRED NEC 09/28/2006   pt denies and doesn't know  . Long term current use of anticoagulant 04/08/2010  . Obese   . OBSTRUCTIVE SLEEP APNEA 01/06/2008   NOT USING    . Right  knee DJD 07/09/2010  . S/P CABG x 1 11/26/2011   Right internal mammary artery to right coronary artery  . S/P Maze operation for atrial fibrillation 11/26/2011   complete biatrial lesion set with clipping of LA appendage  . Sleep apnea     ROS:   All systems reviewed and negative except as noted in the HPI.   Past Surgical History:  Procedure Laterality Date  . ANTERIOR CERVICAL DECOMP/DISCECTOMY FUSION N/A 06/20/2016   Procedure: Anterior Cervical Discectomy Fusion - Cervical five-Cervical six - Cervical six-Cervical seven - Cervicla seven- Thoracic one;  Surgeon: Earnie Larsson, MD;  Location: [facility];  Service: Neurosurgery;  Laterality: N/A;  . CARDIOVERSION     X4   . CORONARY ARTERY BYPASS GRAFT  11/26/2011   Procedure: CORONARY ARTERY BYPASS GRAFTING (CABG);  Surgeon: Rexene Alberts, MD;  Location: [facility];   Service: Open Heart Surgery;  Laterality: N/A;  coronary artery bypass on pump times one utilizing the right internal mammary artery, transesophageal echocardiogram   . HAND SURGERY Left   . LEFT HEART CATHETERIZATION WITH CORONARY ANGIOGRAM N/A 11/24/2011   Procedure: LEFT HEART CATHETERIZATION WITH CORONARY ANGIOGRAM;  Surgeon: Peter M Martinique, MD;  [facility];  Service: Cardiovascular;  Laterality: N/A;  . LOOP RECORDER IMPLANT N/A 07/06/2012   Procedure: LOOP RECORDER IMPLANT;  Surgeon: Thompson Grayer, MD;  Location: [facility] CATH LAB;  Service: Cardiovascular;  Laterality: N/A;  . MAZE  11/26/2011   Procedure: MAZE;  Surgeon: Rexene Alberts, MD;  Location: [place];  Service: Open Heart Surgery;  Laterality: N/A;  C[place]omaze   . Radiofrequency Ablation for atrial fibrillation  12/30/07 and 06/05/09   afib ablation x 2 by [facility]  . Radiofrequency ablation for atrial flutter  2005   [facility]I  . TOTAL KNEE ARTHROPLASTY Right 04/05/2019   Procedure: RIGHT TOTAL KNEE ARTHROPLASTY;  Surgeon: Melrose Nakayama, MD;  Location: [facility];  Service: Orthopedics;  Laterality: Right;     Family History  Problem Relation Age of Onset  . Dementia Father   . Hypertension Father   . Atrial fibrillation Mother   . Stroke Mother   . Dementia Paternal Grandfather   . Cancer Paternal Grandmother        type unknown  . Colon cancer Neg Hx   . Esophageal cancer Neg Hx   . Prostate cancer Neg Hx   . Rectal cancer Neg Hx   . Stomach cancer Neg Hx      Social History   Socioeconomic History  . Marital status: Single    Spouse name: Not on file  . Number of children: 0  . Years of education: Not on file  . Highest education level: Not on file  Occupational History  . Occupation: works at the [employer]  . Smoking status: Former Smoker    Packs/day: 1.50    Years: 26.00    Pack years: 39.00    Types: Cigarettes    Quit date: 04/06/1995    Years since quitting: 25.1  . Smokeless tobacco:  Never Used  Vaping Use  . Vaping Use: Never used  Substance and Sexual Activity  . Alcohol use: No    Alcohol/week: 0.0 standard drinks  . Drug use: No  . Sexual activity: Yes  Other Topics Concern  . Not on file  Social History Narrative   Pt lives in [place], [place] with his friend.    Social Determinants of Health   Financial  Resource Strain: Low Risk   . Difficulty of Paying Living Expenses: Not hard at all  Food Insecurity: No Food Insecurity  . Worried About Charity fundraiser in the Last Year: Never true  . Ran Out of Food in the Last Year: Never true  Transportation Needs: No Transportation Needs  . Lack of Transportation (Medical): No  . Lack of Transportation (Non-Medical): No  Physical Activity: Sufficiently Active  . Days of Exercise per Week: 7 days  . Minutes of Exercise per Session: 30 min  Stress: No Stress Concern Present  . Feeling of Stress : Not at all  Social Connections: Moderately Integrated  . Frequency of Communication with Friends and Family: More than three times a week  . Frequency of Social Gatherings with Friends and Family: More than three times a week  . Attends Religious Services: 1 to 4 times per year  . Active Member of Clubs or Organizations: Yes  . Attends Archivist Meetings: 1 to 4 times per year  . Marital Status: Never married  Intimate Partner Violence: Not At Risk  . Fear of Current or Ex-Partner: No  . Emotionally Abused: No  . Physically Abused: No  . Sexually Abused: No     BP 116/66   Pulse 70   Ht 5\' 6"  (1.676 m)   Wt 206 lb 3.2 oz (93.5 kg)   SpO2 99%   BMI 33.28 kg/m   Physical Exam:  Well appearing NAD HEENT: Unremarkable Neck:  No JVD, no thyromegally Lymphatics:  No adenopathy Back:  No CVA tenderness Lungs:  Clear HEART:  Regular rate rhythm, no murmurs, no rubs, no clicks Abd:  soft, positive bowel sounds, no organomegally, no rebound, no guarding Ext:  2 plus pulses, no edema, no cyanosis,  no clubbing Skin:  No rashes no nodules Neuro:  CN II through XII intact, motor grossly intact  EKG - nsr   Assess/Plan: 1. PAF - he appears to be mostly maintaining NSR. He has minimal palpitations. 2. Obesity - he has gained 8 lbs since his last visit. I encouraged him to try and lose weight. 3. HTN - his bp is well controlled. No change in meds. 4. Chronic diastolic heart failure - his symptoms are class 1. He will continue his current meds. He is encouraged to start walking again and maintain a low sodium diet.  Carleene Overlie Latishia Suitt,MD

## 2020-05-31 ENCOUNTER — Other Ambulatory Visit: Payer: Self-pay | Admitting: Internal Medicine

## 2020-06-25 ENCOUNTER — Other Ambulatory Visit: Payer: Self-pay | Admitting: Internal Medicine

## 2020-06-25 NOTE — Telephone Encounter (Signed)
Please refill as per office routine med refill policy (all routine meds refilled for 3 mo or monthly per pt preference up to one year from last visit, then month to month grace period for 3 mo, then further med refills will have to be denied)  

## 2020-06-29 DIAGNOSIS — Z96651 Presence of right artificial knee joint: Secondary | ICD-10-CM | POA: Diagnosis not present

## 2020-06-29 DIAGNOSIS — M25561 Pain in right knee: Secondary | ICD-10-CM | POA: Diagnosis not present

## 2020-07-04 ENCOUNTER — Other Ambulatory Visit: Payer: Self-pay | Admitting: Internal Medicine

## 2020-07-04 NOTE — Telephone Encounter (Signed)
Please refill as per office routine med refill policy (all routine meds refilled for 3 mo or monthly per pt preference up to one year from last visit, then month to month grace period for 3 mo, then further med refills will have to be denied)  

## 2020-07-09 ENCOUNTER — Ambulatory Visit: Payer: PPO | Admitting: Internal Medicine

## 2020-07-12 ENCOUNTER — Other Ambulatory Visit: Payer: Self-pay

## 2020-07-13 ENCOUNTER — Ambulatory Visit (INDEPENDENT_AMBULATORY_CARE_PROVIDER_SITE_OTHER): Payer: PPO | Admitting: Internal Medicine

## 2020-07-13 ENCOUNTER — Encounter: Payer: Self-pay | Admitting: Internal Medicine

## 2020-07-13 VITALS — BP 128/70 | HR 64 | Temp 98.1°F | Ht 66.0 in | Wt 205.0 lb

## 2020-07-13 DIAGNOSIS — R739 Hyperglycemia, unspecified: Secondary | ICD-10-CM | POA: Diagnosis not present

## 2020-07-13 DIAGNOSIS — E039 Hypothyroidism, unspecified: Secondary | ICD-10-CM

## 2020-07-13 DIAGNOSIS — Z0000 Encounter for general adult medical examination without abnormal findings: Secondary | ICD-10-CM

## 2020-07-13 DIAGNOSIS — E7800 Pure hypercholesterolemia, unspecified: Secondary | ICD-10-CM | POA: Diagnosis not present

## 2020-07-13 DIAGNOSIS — E559 Vitamin D deficiency, unspecified: Secondary | ICD-10-CM

## 2020-07-13 DIAGNOSIS — N1831 Chronic kidney disease, stage 3a: Secondary | ICD-10-CM

## 2020-07-13 DIAGNOSIS — E538 Deficiency of other specified B group vitamins: Secondary | ICD-10-CM

## 2020-07-13 LAB — CBC WITH DIFFERENTIAL/PLATELET
Basophils Absolute: 0 10*3/uL (ref 0.0–0.1)
Basophils Relative: 0.5 % (ref 0.0–3.0)
Eosinophils Absolute: 0.3 10*3/uL (ref 0.0–0.7)
Eosinophils Relative: 3.2 % (ref 0.0–5.0)
HCT: 42.1 % (ref 39.0–52.0)
Hemoglobin: 14.4 g/dL (ref 13.0–17.0)
Lymphocytes Relative: 11.7 % — ABNORMAL LOW (ref 12.0–46.0)
Lymphs Abs: 1 10*3/uL (ref 0.7–4.0)
MCHC: 34.3 g/dL (ref 30.0–36.0)
MCV: 93.8 fl (ref 78.0–100.0)
Monocytes Absolute: 0.8 10*3/uL (ref 0.1–1.0)
Monocytes Relative: 9.2 % (ref 3.0–12.0)
Neutro Abs: 6.7 10*3/uL (ref 1.4–7.7)
Neutrophils Relative %: 75.4 % (ref 43.0–77.0)
Platelets: 193 10*3/uL (ref 150.0–400.0)
RBC: 4.49 Mil/uL (ref 4.22–5.81)
RDW: 13.7 % (ref 11.5–15.5)
WBC: 8.9 10*3/uL (ref 4.0–10.5)

## 2020-07-13 LAB — URINALYSIS, ROUTINE W REFLEX MICROSCOPIC
Bilirubin Urine: NEGATIVE
Hgb urine dipstick: NEGATIVE
Ketones, ur: NEGATIVE
Leukocytes,Ua: NEGATIVE
Nitrite: NEGATIVE
Specific Gravity, Urine: >=1.03 — AB (ref 1.000–1.030)
Total Protein, Urine: NEGATIVE
Urine Glucose: NEGATIVE
Urobilinogen, UA: 0.2 (ref 0.0–1.0)
pH: 5.5 (ref 5.0–8.0)

## 2020-07-13 LAB — HEPATIC FUNCTION PANEL
ALT: 18 U/L (ref 0–53)
AST: 18 U/L (ref 0–37)
Albumin: 4.1 g/dL (ref 3.5–5.2)
Alkaline Phosphatase: 58 U/L (ref 39–117)
Bilirubin, Direct: 0.1 mg/dL (ref 0.0–0.3)
Total Bilirubin: 0.5 mg/dL (ref 0.2–1.2)
Total Protein: 6.8 g/dL (ref 6.0–8.3)

## 2020-07-13 LAB — LIPID PANEL
Cholesterol: 145 mg/dL (ref 0–200)
HDL: 42.4 mg/dL (ref >39.00)
LDL Cholesterol: 85 mg/dL (ref 0–99)
NonHDL: 102.99
Total CHOL/HDL Ratio: 3
Triglycerides: 90 mg/dL (ref 0.0–149.0)
VLDL: 18 mg/dL (ref 0.0–40.0)

## 2020-07-13 LAB — BASIC METABOLIC PANEL
BUN: 28 mg/dL — ABNORMAL HIGH (ref 6–23)
CO2: 27 mEq/L (ref 19–32)
Calcium: 9.1 mg/dL (ref 8.4–10.5)
Chloride: 105 mEq/L (ref 96–112)
Creatinine, Ser: 1.47 mg/dL (ref 0.40–1.50)
GFR: 49.83 mL/min — ABNORMAL LOW (ref >60.00)
Glucose, Bld: 103 mg/dL — ABNORMAL HIGH (ref 70–99)
Potassium: 4.3 mEq/L (ref 3.5–5.1)
Sodium: 142 mEq/L (ref 135–145)

## 2020-07-13 LAB — VITAMIN B12: Vitamin B-12: 565 pg/mL (ref 211–911)

## 2020-07-13 LAB — TSH: TSH: 3.36 u[IU]/mL (ref 0.35–4.50)

## 2020-07-13 LAB — HEMOGLOBIN A1C: Hgb A1c MFr Bld: 5.6 % (ref 4.6–6.5)

## 2020-07-13 LAB — VITAMIN D 25 HYDROXY (VIT D DEFICIENCY, FRACTURES): VITD: 41.17 ng/mL (ref 30.00–100.00)

## 2020-07-13 LAB — PSA: PSA: 0.93 ng/mL (ref 0.10–4.00)

## 2020-07-13 NOTE — Progress Notes (Signed)
Patient ID: Scott [name]., male   DOB: 1955-08-12, 65 y.o.   MRN: [id number]         Chief Complaint:: wellness exam and Follow-up  left CTS symptoms and chronic right knee pain       HPI:  Scott [name]. is a 65 y.o. male here for wellness exam; up to date with preventive referrals and immunizations                        Also c/o mild persistently recurring more on than off left arm and fingers numbness without pain or weakness for about 3 months, worse in the AM, maybe better later in the day, not clear if anything else makes better or worse.  Also had ongoing right knee pain after TKR, mild recurrent and has had recent cortisone, does not limit activity and in fact walks 4-5 miles several days per wk with the dog but mostly dissapointed in the post surgical pain outcome.  Pt denies chest pain, increased sob or doe, wheezing, orthopnea, PND, increased LE swelling, palpitations, dizziness or syncope.   Pt denies polydipsia, polyuria, or other new focal neuro s/s.   Pt denies fever, wt loss, night sweats, loss of appetite, or other constitutional symptoms  No other new complaints   Wt Readings from Last 3 Encounters:  07/13/20 205 lb (93 kg)  05/29/20 206 lb 3.2 oz (93.5 kg)  05/03/20 214 lb (97.1 kg)   BP Readings from Last 3 Encounters:  07/13/20 128/70  05/29/20 116/66  05/03/20 (!) 142/80   Immunization History  Administered Date(s) Administered  . Influenza Inj Mdck Quad Pf 11/20/2018  . Influenza Split 11/25/2011  . Influenza Whole 01/13/2005, 11/26/2007, 12/06/2008, 11/29/2009  . Influenza,inj,Quad PF,6+ Mos 03/08/2015, 11/04/2019  . Influenza-Unspecified 10/25/2012, 11/21/2015, 11/25/2015, 12/25/2016, 12/10/2019  . PFIZER(Purple Top)SARS-COV-2 Vaccination 05/26/2019, 06/20/2019, 12/16/2019, 07/13/2020  . PNEUMOCOCCAL CONJUGATE-20 06/08/2020  . Pneumococcal Conjugate-13 12/21/2015  . Pneumococcal Polysaccharide-23 03/11/2011  . Td 03/27/2004, 04/04/2014  . Zoster  01/04/2016  There are no preventive care reminders to display for this patient.    Past Medical History:  Diagnosis Date  . ALLERGIC RHINITIS 01/14/2007  . Allergy   . ANXIETY 09/28/2006  . Asbestos exposure   . Atrial fibrillation ([place]) 09/28/2006   s/p afib ablation x 2  . CHF (congestive heart failure) ([place])   . CKD (chronic kidney disease), stage III ([place])    patient denies  . COPD (chronic obstructive pulmonary disease) ([place])   . Coronary artery disease 11/24/2011  . Diastolic dysfunction 10/30/2839  . Dyspnea    W/ EXERTION   . Dysrhythmia    AFIB     . First degree AV block 10/12/2018   Noted on EKG  . Gallstones   . Hyperglycemia   . HYPERLIPIDEMIA 09/28/2006  . HYPERTENSION 09/28/2006  . HYPOTHYROIDISM, ACQUIRED NEC 09/28/2006   pt denies and doesn't know  . Long term current use of anticoagulant 04/08/2010  . Obese   . OBSTRUCTIVE SLEEP APNEA 01/06/2008   NOT USING    . Right knee DJD 07/09/2010  . S/P CABG x 1 11/26/2011   Right internal mammary artery to right coronary artery  . S/P Maze operation for atrial fibrillation 11/26/2011   complete biatrial lesion set with clipping of LA appendage  . Sleep apnea    Past Surgical History:  Procedure Laterality Date  . ANTERIOR CERVICAL DECOMP/DISCECTOMY FUSION N/A 06/20/2016   Procedure: Anterior Cervical  Discectomy Fusion - Cervical five-Cervical six - Cervical six-Cervical seven - Cervicla seven- Thoracic one;  Surgeon: Earnie Larsson, MD;  Location: [place];  Service: Neurosurgery;  Laterality: N/A;  . CARDIOVERSION     X4   . CORONARY ARTERY BYPASS GRAFT  11/26/2011   Procedure: CORONARY ARTERY BYPASS GRAFTING (CABG);  Surgeon: Rexene Alberts, MD;  Location: [facility];  Service: Open Heart Surgery;  Laterality: N/A;  coronary artery bypass on pump times one utilizing the right internal mammary artery, transesophageal echocardiogram   . HAND SURGERY Left   . LEFT HEART CATHETERIZATION WITH CORONARY ANGIOGRAM N/A 11/24/2011   Procedure:  LEFT HEART CATHETERIZATION WITH CORONARY ANGIOGRAM;  Surgeon: Peter M Martinique, MD;  Location: [facility] CATH LAB;  Service: Cardiovascular;  Laterality: N/A;  . LOOP RECORDER IMPLANT N/A 07/06/2012   Procedure: LOOP RECORDER IMPLANT;  Surgeon: Thompson Grayer, MD;  Location: [facility] CATH LAB;  Service: Cardiovascular;  Laterality: N/A;  . MAZE  11/26/2011   Procedure: MAZE;  Surgeon: Rexene Alberts, MD;  Location: [place];  Service: Open Heart Surgery;  Laterality: N/A;  Cryomaze   . Radiofrequency Ablation for atrial fibrillation  12/30/07 and 06/05/09   afib ablation x 2 by JA  . Radiofrequency ablation for atrial flutter  2005   CTI  . TOTAL KNEE ARTHROPLASTY Right 04/05/2019   Procedure: RIGHT TOTAL KNEE ARTHROPLASTY;  Surgeon: Melrose Nakayama, MD;  Location: [facility];  Service: Orthopedics;  Laterality: Right;    reports that he quit smoking about 25 years ago. His smoking use included cigarettes. He has a 39.00 pack-year smoking history. He has never used smokeless tobacco. He reports that he does not drink alcohol and does not use drugs. family history includes Atrial fibrillation in his mother; Cancer in his paternal grandmother; Dementia in his father and paternal grandfather; Hypertension in his father; Stroke in his mother. Allergies  Allergen Reactions  . Zolpidem Tartrate Other (See Comments)    Extremely drowsy the next day   Current Outpatient Medications on File Prior to Visit  Medication Sig Dispense Refill  . acetaminophen (TYLENOL) 500 MG tablet Take 1,000 mg by mouth every 6 (six) hours as needed for mild pain.     Marland Kitchen ALPRAZolam (XANAX) 0.5 MG tablet TAKE 1 TABLET BY MOUTH TWICE A DAY AS NEEDED FOR ANXIETY 180 tablet 1  . budesonide-formoterol (SYMBICORT) 160-4.5 MCG/ACT inhaler TAKE 2 PUFFS BY MOUTH TWICE A DAY 30.6 each 4  . cholecalciferol (VITAMIN D) 1000 units tablet Take 1,000 Units by mouth daily.    Marland Kitchen docusate sodium (COLACE) 100 MG capsule Take 200 mg by mouth daily as needed for mild  constipation.     Marland Kitchen ELIQUIS 5 MG TABS tablet TAKE 1 TABLET BY MOUTH TWICE A DAY 180 tablet 2  . furosemide (LASIX) 20 MG tablet TAKE 1 TABLET BY MOUTH EVERY DAY AS NEEDED 90 tablet 3  . gabapentin (NEURONTIN) 100 MG capsule TAKE 1 CAPSULE BY MOUTH THREE TIMES A DAY 270 capsule 2  . lovastatin (MEVACOR) 40 MG tablet TAKE 2 TABLETS (80 MG TOTAL) BY MOUTH EVERY EVENING. 180 tablet 2  . metoprolol succinate (TOPROL-XL) 50 MG 24 hr tablet TAKE 1 AND 1/2 TABLETS BY MOUTH DAILY 45 tablet 0  . Multiple Vitamin (MULTIVITAMIN WITH MINERALS) TABS tablet Take 1 tablet by mouth daily.     . Olmesartan-amLODIPine-HCTZ 40-5-12.5 MG TABS TAKE 1 TABLET BY MOUTH EVERY DAY 90 tablet 2  . PROAIR HFA 108 (90 Base) MCG/ACT inhaler  TAKE 2 PUFFS BY MOUTH EVERY 6 HOURS AS NEEDED FOR WHEEZE OR SHORTNESS OF BREATH 8.5 Inhaler 11  . temazepam (RESTORIL) 30 MG capsule TAKE 1 CAPSULE (30 MG TOTAL) BY MOUTH AT BEDTIME AS NEEDED. FOR SLEEP 90 capsule 1  . traMADol (ULTRAM) 50 MG tablet Take 1 tablet (50 mg total) by mouth every 6 (six) hours as needed. 120 tablet 2  . spironolactone (ALDACTONE) 25 MG tablet Take 0.5 tablets (12.5 mg total) by mouth at bedtime. 90 tablet 2   No current facility-administered medications on file prior to visit.        ROS:  All others reviewed and negative.  Objective        PE:  BP 128/70 (BP Location: Right Arm, Patient Position: Sitting, Cuff Size: Normal)   Pulse 64   Temp 98.1 F (36.7 C) (Oral)   Ht 5\' 6"  (1.676 m)   Wt 205 lb (93 kg)   SpO2 97%   BMI 33.09 kg/m                 Constitutional: Pt appears in NAD               HENT: Head: NCAT.                Right Ear: External ear normal.                 Left Ear: External ear normal.                Eyes: . Pupils are equal, round, and reactive to light. Conjunctivae and EOM are normal               Nose: without d/c or deformity               Neck: Neck supple. Gross normal ROM               Cardiovascular: Normal rate and  regular rhythm.                 Pulmonary/Chest: Effort normal and breath sounds without rales or wheezing.                Abd:  Soft, NT, ND, + BS, no organomegaly               Neurological: Pt is alert. At baseline orientation, motor grossly intact               Skin: Skin is warm. No rashes, no other new lesions, LE edema - none               Psychiatric: Pt behavior is normal without agitation   Micro: none  Cardiac tracings I have personally interpreted today:  none  Pertinent Radiological findings (summarize): none   Lab Results  Component Value Date   WBC 8.9 07/13/2020   HGB 14.4 07/13/2020   HCT 42.1 07/13/2020   PLT 193.0 07/13/2020   GLUCOSE 103 (H) 07/13/2020   CHOL 145 07/13/2020   TRIG 90.0 07/13/2020   HDL 42.40 07/13/2020   LDLCALC 85 07/13/2020   ALT 18 07/13/2020   AST 18 07/13/2020   NA 142 07/13/2020   K 4.3 07/13/2020   CL 105 07/13/2020   CREATININE 1.47 07/13/2020   BUN 28 (H) 07/13/2020   CO2 27 07/13/2020   TSH 3.36 07/13/2020   PSA 0.93 07/13/2020   INR 1.0 03/31/2019   HGBA1C 5.6 07/13/2020   Assessment/Plan:  Melanie Hume [name]. is a 65 y.o. White or Caucasian [1] male with  has a past medical history of ALLERGIC RHINITIS (01/14/2007), Allergy, ANXIETY (09/28/2006), Asbestos exposure, Atrial fibrillation ([place]) (09/28/2006), CHF (congestive heart failure) ([place]), CKD (chronic kidney disease), stage III ([place]), COPD (chronic obstructive pulmonary disease) ([place]), Coronary artery disease ([id number]), Diastolic dysfunction (99991111), Dyspnea, Dysrhythmia, First degree AV block (10/12/2018), Gallstones, Hyperglycemia, HYPERLIPIDEMIA (09/28/2006), HYPERTENSION (09/28/2006), HYPOTHYROIDISM, ACQUIRED NEC (09/28/2006), Long term current use of anticoagulant (04/08/2010), Obese, OBSTRUCTIVE SLEEP APNEA (01/06/2008), Right knee DJD (07/09/2010), S/P CABG x 1 (11/26/2011), S/P Maze operation for atrial fibrillation (11/26/2011), and Sleep apnea.  Preventative health  care Age and sex appropriate education and counseling updated with regular exercise and diet Referrals for preventative services - none needed Immunizations addressed - none needed Smoking counseling  - none needed Evidence for depression or other mood disorder - none significant Most recent labs reviewed. I have personally reviewed and have noted: 1) the patient's medical and social history 2) The patient's current medications and supplements 3) The patient's height, weight, and BMI have been recorded in the chart   Hypothyroidism Lab Results  Component Value Date   TSH 3.36 07/13/2020   Stable, pt to continue levothyroxine  Hyperlipidemia Lab Results  Component Value Date   LDLCALC 85 07/13/2020   Stable, pt to continue current statin lovastatin   Hyperglycemia Lab Results  Component Value Date   HGBA1C 5.6 07/13/2020   Stable, pt to continue current medical treatment  - diet   CKD (chronic kidney disease) stage 3, GFR 30-59 ml/min Lab Results  Component Value Date   CREATININE 1.47 07/13/2020   Stable overall, cont to avoid nephrotoxins   Followup: Return in about 6 months (around 01/13/2021).  Cathlean Cower, MD 07/14/2020 10:36 AM [facility]

## 2020-07-13 NOTE — Patient Instructions (Signed)
Please continue all other medications as before, and refills have been done if requested.  Please have the pharmacy call with any other refills you may need.  Please continue your efforts at being more active, low cholesterol diet, and weight control.  You are otherwise up to date with prevention measures today.  Please keep your appointments with your specialists as you may have planned  Please consider wearing a left wrist splint at night to help the numbness  Please go to the LAB at the blood drawing area for the tests to be done  You will be contacted by phone if any changes need to be made immediately.  Otherwise, you will receive a letter about your results with an explanation, but please check with MyChart first.  Please remember to sign up for MyChart if you have not done so, as this will be important to you in the future with finding out test results, communicating by private email, and scheduling acute appointments online when needed.  Please make an Appointment to return in 6 months, or sooner if needed

## 2020-07-14 ENCOUNTER — Encounter: Payer: Self-pay | Admitting: Internal Medicine

## 2020-07-14 NOTE — Assessment & Plan Note (Signed)
Lab Results  Component Value Date   TSH 3.36 07/13/2020   Stable, pt to continue levothyroxine

## 2020-07-14 NOTE — Assessment & Plan Note (Signed)
Lab Results  Component Value Date   LDLCALC 85 07/13/2020   Stable, pt to continue current statin lovastatin

## 2020-07-14 NOTE — Assessment & Plan Note (Signed)
Lab Results  Component Value Date   CREATININE 1.47 07/13/2020   Stable overall, cont to avoid nephrotoxins

## 2020-07-14 NOTE — Assessment & Plan Note (Signed)
Lab Results  Component Value Date   HGBA1C 5.6 07/13/2020   Stable, pt to continue current medical treatment  - diet

## 2020-07-14 NOTE — Assessment & Plan Note (Signed)

## 2020-07-17 ENCOUNTER — Telehealth: Payer: Self-pay | Admitting: *Deleted

## 2020-07-17 NOTE — Telephone Encounter (Signed)
The patient has been notified of the result and verbalized understanding.  All questions (if any) were answered. Marolyn Hammock, [place] 07/17/2020 1:17 PM

## 2020-07-17 NOTE — Telephone Encounter (Signed)
-----   Message from Sueanne Margarita, MD sent at 07/16/2020 11:36 AM EDT ----- Good AHI and compliance.  Continue current PAP settings.

## 2020-07-22 ENCOUNTER — Other Ambulatory Visit: Payer: Self-pay | Admitting: Internal Medicine

## 2020-07-22 NOTE — Telephone Encounter (Signed)
Please refill as per office routine med refill policy (all routine meds refilled for 3 mo or monthly per pt preference up to one year from last visit, then month to month grace period for 3 mo, then further med refills will have to be denied)  

## 2020-07-30 ENCOUNTER — Other Ambulatory Visit: Payer: Self-pay | Admitting: Internal Medicine

## 2020-07-31 ENCOUNTER — Other Ambulatory Visit: Payer: Self-pay | Admitting: Internal Medicine

## 2020-08-18 ENCOUNTER — Other Ambulatory Visit: Payer: Self-pay | Admitting: Internal Medicine

## 2020-08-18 NOTE — Telephone Encounter (Signed)
Please refill as per office routine med refill policy (all routine meds refilled for 3 mo or monthly per pt preference up to one year from last visit, then month to month grace period for 3 mo, then further med refills will have to be denied)  

## 2020-08-22 ENCOUNTER — Other Ambulatory Visit: Payer: Self-pay | Admitting: Internal Medicine

## 2020-08-22 NOTE — Telephone Encounter (Signed)
Please refill as per office routine med refill policy (all routine meds refilled for 3 mo or monthly per pt preference up to one year from last visit, then month to month grace period for 3 mo, then further med refills will have to be denied)  

## 2020-09-06 ENCOUNTER — Telehealth: Payer: Self-pay | Admitting: *Deleted

## 2020-09-06 NOTE — Chronic Care Management (AMB) (Signed)
  Chronic Care Management   Note  09/06/2020 Name: Peder Allums. MRN: [id number] DOB: 1955-09-16  [name] is a 65 y.o. year old male who is a primary care patient of [name], Hunt Oris, MD. I reached out to [name] by phone today in response to a referral sent by Mr. Keevon Henney [name] Jr.'s PCP [name], MD     Mr. Joanne Chars was given information about Chronic Care Management services today including:  CCM service includes personalized support from designated clinical staff supervised by his physician, including individualized plan of care and coordination with other care providers 24/7 contact phone numbers for assistance for urgent and routine care needs. Service will only be billed when office clinical staff spend 20 minutes or more in a month to coordinate care. Only one practitioner may furnish and bill the service in a calendar month. The patient may stop CCM services at any time (effective at the end of the month) by phone call to the office staff. The patient will be responsible for cost sharing (co-pay) of up to 20% of the service fee (after annual deductible is met).  Patient agreed to services and verbal consent obtained.   Follow up plan: Telephone appointment with care management team member scheduled for:09/28/2020  Julian Hy, [place]  Direct Dial: [phone]

## 2020-09-28 ENCOUNTER — Ambulatory Visit (INDEPENDENT_AMBULATORY_CARE_PROVIDER_SITE_OTHER): Payer: PPO | Admitting: *Deleted

## 2020-09-28 DIAGNOSIS — J449 Chronic obstructive pulmonary disease, unspecified: Secondary | ICD-10-CM

## 2020-09-28 DIAGNOSIS — I5042 Chronic combined systolic (congestive) and diastolic (congestive) heart failure: Secondary | ICD-10-CM | POA: Diagnosis not present

## 2020-09-28 NOTE — Patient Instructions (Signed)
Visit Information   Scott [name], it was nice talking with you today.   Please read over the attached information, and continue weighing yourself at least 4 times per day and checking your blood pressures at home at least one time each week; we will talk about these during each of our phone call appointments   I look forward to talking to you again for an update on Friday December 21, 2020 at 9:00 am, - please be listening out for my call that day.  I will call as close to 9:00 am as possible.   If you need to cancel or re-schedule our telephone visit, please call [phone] and one of our care guides will be happy to assist you.   I look forward to hearing about your progress.   Please don't hesitate to contact me if I can be of assistance to you before our next scheduled telephone appointment.   [name], RN, BSN, [facility] RN Care Coordination- [place] [phone]: direct office [phone]: mobile   PATIENT GOALS:   Goals Addressed             This Visit's Progress    Patient Self-Care Activities   On track    Timeframe:  Long-Range Goal Priority:  Medium Start Date:          09/28/20                   Expected End Date:    09/28/21                   Patient will self administer medications as prescribed Patient will attend all scheduled provider appointments Patient will call pharmacy for medication refills Patient will call provider office for new concerns or questions Patient will monitor and write down on paper: weights at home at least 4 times per week; blood pressures at home at least once per week Patient will continue to stay active and consider weather conditions to modify exercise as needed Patient will continue to follow heart-healthy, low sodium diet       Heart Failure Action Plan A heart failure action plan helps you understand what to do when you have symptoms of heart failure. Your action plan is a color-coded plan  that lists the symptoms to watch for and indicates what actions to take. If you have symptoms in the red zone, you need medical care right away. If you have symptoms in the yellow zone, you are having problems. If you have symptoms in the green zone, you are doing well. Follow the plan that was created by you and your health care provider. Reviewyour plan each time you visit your health care provider. Red zone These signs and symptoms mean you should get medical help right away: You have trouble breathing when resting. You have a dry cough that is getting worse. You have swelling or pain in your legs or abdomen that is getting worse. You suddenly gain more than 2-3 lb (0.9-1.4 kg) in 24 hours, or more than 5 lb (2.3 kg) in a week. This amount may be more or less depending on your condition. You have trouble staying awake or you feel confused. You have chest pain. You do not have an appetite. You pass out. You have worsening sadness or depression. If you have any of these symptoms, call your local emergency services (911 in the [place] right away. Do not drive yourself to the hospital. Yellow zone  These signs and symptoms mean your condition may be getting worse and you should make some changes: You have trouble breathing when you are active, or you need to sleep with your head raised on extra pillows to help you breathe. You have swelling in your legs or abdomen. You gain 2-3 lb (0.9-1.4 kg) in 24 hours, or 5 lb (2.3 kg) in a week. This amount may be more or less depending on your condition. You get tired easily. You have trouble sleeping. You have a dry cough. If you have any of these symptoms: Contact your health care provider within the next day. Your health care provider may adjust your medicines. Green zone These signs mean you are doing well and can continue what you are doing: You do not have shortness of breath. You have very little swelling or no new swelling. Your weight is  stable (no gain or loss). You have a normal activity level. You do not have chest pain or any other new symptoms. Follow these instructions at home: Take over-the-counter and prescription medicines only as told by your health care provider. Weigh yourself daily. Your target weight is __________ lb (__________ kg). Call your health care provider if you gain more than __________ lb (__________ kg) in 24 hours, or more than __________ lb (__________ kg) in a week. Health care provider name: _____________________________________________________ Health care provider phone number: _____________________________________________________ Eat a heart-healthy diet. Work with a diet and nutrition specialist (dietitian) to create an eating plan that is best for you. Keep all follow-up visits. This is important. Where to find more information American Heart Association: www.heart.org Summary A heart failure action plan helps you understand what to do when you have symptoms of heart failure. Follow the action plan that was created by you and your health care provider. Get help right away if you have any symptoms in the red zone. This information is not intended to replace advice given to you by your health care provider. Make sure you discuss any questions you have with your healthcare provider. Document Revised: 09/26/2019 Document Reviewed: 09/26/2019 Elsevier Patient Education  2022 [employer].  Consent to CCM Services: Mr. Joanne Chars was given information about Chronic Care Management services including:  CCM service includes personalized support from designated clinical staff supervised by his physician, including individualized plan of care and coordination with other care providers 24/7 contact phone numbers for assistance for urgent and routine care needs. Service will only be billed when office clinical staff spend 20 minutes or more in a month to coordinate care. Only one practitioner may furnish  and bill the service in a calendar month. The patient may stop CCM services at any time (effective at the end of the month) by phone call to the office staff. The patient will be responsible for cost sharing (co-pay) of up to 20% of the service fee (after annual deductible is met).  Patient agreed to services and verbal consent obtained.   The patient verbalized understanding of instructions, educational materials, and care plan provided today and agreed to receive a mailed copy of patient instructions, educational materials, and care plan.  Telephone follow up appointment with care management team member scheduled for:  Friday December 21, 2020 at 9:00 am The patient has been provided with contact information for the care management team and has been advised to call with any health related questions or concerns.   [name], RN, BSN, [facility] RN Care Coordination- [place] [phone]:  direct office [phone]: mobile   CLINICAL CARE PLAN: Patient Care Plan: General Nursing  (Adult)     Problem Identified: Chronic Disease Management Needs   Priority: Medium     Long-Range Goal: Development of plan of care for long term chronic disease management   Start Date: 09/28/2020  Expected End Date: 09/28/2021  Priority: Medium  Note:   Current Barriers:  Chronic Disease Management support and education needs related to CHF and COPD  RNCM Clinical Goal(s):  Patient will demonstrate ongoing adherence to prescribed treatment plan for CHF and COPD as evidenced by adherence to prescribed medication regimen contacting provider for new or worsened symptoms or questions following action plan for COPD/ CHF, including monitoring/ recording weekly weights and blood pressures at home; following heart healthy, low salt diet; ongoing activity/ exercise  through collaboration with RN Care manager, provider, and care team.   Interventions: 1:1 collaboration with  primary care provider regarding development and update of comprehensive plan of care as evidenced by provider attestation and co-signature Inter-disciplinary care team collaboration (see longitudinal plan of care) Evaluation of current treatment plan related to  self management and patient's adherence to plan as established by provider;  COPD: (Status: New goal.) Advised patient to track and manage COPD triggers;  Provided instruction about proper use of medications used for management of COPD including inhalers; Advised patient to self assesses COPD action plan zone and make appointment with provider if in the yellow zone for 48 hours without improvement; Advised patient to engage in light exercise as tolerated 3-5 days a week to aid in the the management of COPD; Discussed the importance of adequate rest and management of fatigue with COPD; Screening for signs and symptoms of depression related to chronic disease state;  Assessed social determinant of health barriers;   Heart Failure Interventions:  (Status: New goal.) Basic overview and discussion of pathophysiology of Heart Failure reviewed; Provided education on low sodium diet; Reviewed Heart Failure Action Plan in depth and provided written copy; Assessed need for readable accurate scales in home; Advised patient to weigh each morning after emptying bladder; Discussed importance of daily weight and advised patient to weigh and record daily; Reviewed role of diuretics in prevention of fluid overload and management of heart failure; Discussed the importance of keeping all appointments with provider; Provided patient with education about the role of exercise in the management of heart failure; Full Medication review completed: no concerns/ issues/ discrepancies identified  Patient Goals/Self-Care Activities: Patient will self administer medications as prescribed Patient will attend all scheduled provider appointments Patient will call  pharmacy for medication refills Patient will call provider office for new concerns or questions Patient will monitor and write down on paper: weights at home at least 4 times per week; blood pressures at home at least once per week Patient will continue to stay active and consider weather conditions to modify exercise as needed Patient will continue to follow heart-healthy, low sodium diet

## 2020-09-28 NOTE — Chronic Care Management (AMB) (Signed)
Chronic Care Management   CCM RN Visit Note  09/28/2020 Name: Scott [name]. MRN: [id number] DOB: 01/01/1956  Subjective: Scott [name] Scott [name]. is a 65 y.o. year old male who is a primary care patient of [name], MD. The care management team was consulted for assistance with disease management and care coordination needs.    Engaged with patient by telephone for initial visit in response to provider referral for case management and/or care coordination services.   Consent to Services:  The patient was given information about Chronic Care Management services, agreed to services, and gave verbal consent 09/06/20 prior to initiation of services.  Please see initial visit note for detailed documentation.  Patient agreed to services and verbal consent obtained.   Assessment: Review of patient past medical history, allergies, medications, health status, including review of consultants reports, laboratory and other test data, was performed as part of comprehensive evaluation and provision of chronic care management services.   SDOH (Social Determinants of Health) assessments and interventions performed:  SDOH Interventions    Flowsheet Row Most Recent Value  SDOH Interventions   Food Insecurity Interventions Intervention Not Indicated  Housing Interventions Intervention Not Indicated  Transportation Interventions Intervention Not Indicated  [Patient drives self]      CCM Care Plan  Allergies  Allergen Reactions   Zolpidem Tartrate Other (See Comments)    Extremely drowsy the next day   Outpatient Encounter Medications as of 09/28/2020  Medication Sig Note   acetaminophen (TYLENOL) 500 MG tablet Take 1,000 mg by mouth every 6 (six) hours as needed for mild pain.     ALPRAZolam (XANAX) 0.5 MG tablet TAKE 1 TABLET BY MOUTH TWICE A DAY AS NEEDED FOR ANXIETY    budesonide-formoterol (SYMBICORT) 160-4.5 MCG/ACT inhaler TAKE 2 PUFFS BY MOUTH TWICE A DAY    cholecalciferol (VITAMIN D)  1000 units tablet Take 1,000 Units by mouth daily.    docusate sodium (COLACE) 100 MG capsule Take 200 mg by mouth daily as needed for mild constipation.     ELIQUIS 5 MG TABS tablet TAKE 1 TABLET BY MOUTH TWICE A DAY    furosemide (LASIX) 20 MG tablet TAKE 1 TABLET BY MOUTH EVERY DAY AS NEEDED 09/28/2020: 09/28/20: Patient reports taking QD   gabapentin (NEURONTIN) 100 MG capsule TAKE 1 CAPSULE BY MOUTH THREE TIMES A DAY 09/28/2020: 09/28/20: Patient reports takes BID   lovastatin (MEVACOR) 40 MG tablet TAKE 2 TABLETS (80 MG TOTAL) BY MOUTH EVERY EVENING.    metoprolol succinate (TOPROL-XL) 50 MG 24 hr tablet TAKE 1 AND 1/2 TABLETS BY MOUTH DAILY    Multiple Vitamin (MULTIVITAMIN WITH MINERALS) TABS tablet Take 1 tablet by mouth daily.     Olmesartan-amLODIPine-HCTZ 40-5-12.5 MG TABS TAKE 1 TABLET BY MOUTH EVERY DAY    PROAIR HFA 108 (90 Base) MCG/ACT inhaler TAKE 2 PUFFS BY MOUTH EVERY 6 HOURS AS NEEDED FOR WHEEZE OR SHORTNESS OF BREATH    temazepam (RESTORIL) 30 MG capsule TAKE 1 CAPSULE (30 MG TOTAL) BY MOUTH AT BEDTIME AS NEEDED. FOR SLEEP    traMADol (ULTRAM) 50 MG tablet TAKE 1 TABLET BY MOUTH EVERY 6 HOURS AS NEEDED.    spironolactone (ALDACTONE) 25 MG tablet Take 0.5 tablets (12.5 mg total) by mouth at bedtime. 09/28/2020: 09/28/20: Patient reports taking as prescribed   No facility-administered encounter medications on file as of 09/28/2020.   Patient Active Problem List   Diagnosis Date Noted   Pain due to total right knee replacement (  [place]) 01/09/2020   Primary osteoarthritis of right knee 04/05/2019   CKD (chronic kidney disease) stage 3, GFR 30-59 ml/min (HCC) 03/14/2019   Degenerative joint disease of knee, right 09/27/2018   Dyspnea 09/06/2018   Right knee pain 09/06/2018   Radiculitis of left cervical region 03/05/2018   Plantar fasciitis, left 08/19/2017   Chest pain 02/27/2017   Right ear pain 02/27/2017   Left groin pain 09/13/2016   Rib pain on left side 09/13/2016   Left leg  pain 09/13/2016   Stenosis of cervical spine with myelopathy (HCC) 06/20/2016   Special screening for malignant neoplasms, colon 04/04/2016   Cough 01/11/2016   COPD exacerbation ([place]) 12/22/2015   Rectal bleeding 08/14/2014   Intercostal muscle strain 08/07/2014   Chronic meniscal tear of knee 04/06/2014   Heel bone fracture 04/06/2014   Hyperglycemia 04/04/2014   Recurrent falls 04/04/2014   Bilateral knee pain 04/04/2014   Intractable left heel pain 04/04/2014   Insomnia 04/04/2014   Hematochezia 04/04/2014   Bladder neck obstruction 12/19/2013   Encounter for therapeutic drug monitoring 03/30/2013   Palpitations 07/06/2012   Shortness of breath 01/19/2012   Sick sinus syndrome ([place]) 12/01/2011   S/P CABG x 1 11/26/2011   S/P Maze operation for atrial fibrillation 11/26/2011   CAD (coronary artery disease) 11/25/2011   Paroxysmal atrial fibrillation (HCC) 11/24/2011   Chronic combined systolic (congestive) and diastolic (congestive) heart failure ([place]) 11/24/2011   Gross hematuria 12/12/2010   Left knee pain 12/12/2010   Left knee DJD 07/09/2010   Preventative health care 07/07/2010   Chronic anticoagulation 04/08/2010   COPD (chronic obstructive pulmonary disease) ([place]) 10/04/2008   OBSTRUCTIVE SLEEP APNEA 01/06/2008   ALLERGIC RHINITIS 01/14/2007   Hypothyroidism 09/28/2006   Hyperlipidemia 09/28/2006   Anxiety state 09/28/2006   Essential hypertension 09/28/2006   Conditions to be addressed/monitored:  CHF, HTN, and COPD  Care Plan : General Nursing  (Adult)  Updates made by Knox Royalty, RN since 09/28/2020 12:00 AM     Problem: Chronic Disease Management Needs   Priority: Medium     Long-Range Goal: Development of plan of care for long term chronic disease management   Start Date: 09/28/2020  Expected End Date: 09/28/2021  Priority: Medium  Note:   Current Barriers:  Chronic Disease Management support and education needs related to CHF and COPD  RNCM  Clinical Goal(s):  Patient will demonstrate ongoing adherence to prescribed treatment plan for CHF and COPD as evidenced by adherence to prescribed medication regimen contacting provider for new or worsened symptoms or questions following action plan for COPD/ CHF, including monitoring/ recording weekly weights and blood pressures at home; following heart healthy, low salt diet; ongoing activity/ exercise  through collaboration with RN Care manager, provider, and care team.   Interventions: 1:1 collaboration with primary care provider regarding development and update of comprehensive plan of care as evidenced by provider attestation and co-signature Inter-disciplinary care team collaboration (see longitudinal plan of care) Evaluation of current treatment plan related to  self management and patient's adherence to plan as established by provider;  COPD: (Status: New goal.) Advised patient to track and manage COPD triggers;  Provided instruction about proper use of medications used for management of COPD including inhalers; Advised patient to self assesses COPD action plan zone and make appointment with provider if in the yellow zone for 48 hours without improvement; Advised patient to engage in light exercise as tolerated 3-5 days a week to aid in the the  management of COPD; Discussed the importance of adequate rest and management of fatigue with COPD; Screening for signs and symptoms of depression related to chronic disease state;  Assessed social determinant of health barriers;   Heart Failure Interventions:  (Status: New goal.) Basic overview and discussion of pathophysiology of Heart Failure reviewed; Provided education on low sodium diet; Reviewed Heart Failure Action Plan in depth and provided written copy; Assessed need for readable accurate scales in home; Advised patient to weigh each morning after emptying bladder; Discussed importance of daily weight and advised patient to weigh and  record daily; Reviewed role of diuretics in prevention of fluid overload and management of heart failure; Discussed the importance of keeping all appointments with provider; Provided patient with education about the role of exercise in the management of heart failure; Full Medication review completed: no concerns/ issues/ discrepancies identified  Patient Goals/Self-Care Activities: Patient will self administer medications as prescribed Patient will attend all scheduled provider appointments Patient will call pharmacy for medication refills Patient will call provider office for new concerns or questions Patient will monitor and write down on paper: weights at home at least 4 times per week; blood pressures at home at least once per week Patient will continue to stay active and consider weather conditions to modify exercise as needed Patient will continue to follow heart-healthy, low sodium diet      Plan: Telephone follow up appointment with care management team member scheduled for:  Friday December 21, 2020 at 9:00 am The patient has been provided with contact information for the care management team and has been advised to call with any health related questions or concerns  [name], RN, BSN, [place] [phone]: direct office [phone]: mobile

## 2020-10-22 ENCOUNTER — Other Ambulatory Visit: Payer: Self-pay | Admitting: Internal Medicine

## 2020-10-22 NOTE — Telephone Encounter (Signed)
Please refill as per office routine med refill policy (all routine meds to be refilled for 3 mo or monthly (per pt preference) up to one year from last visit, then month to month grace period for 3 mo, then further med refills will have to be denied) ? ?

## 2020-11-02 DIAGNOSIS — G4733 Obstructive sleep apnea (adult) (pediatric): Secondary | ICD-10-CM | POA: Diagnosis not present

## 2020-11-16 ENCOUNTER — Other Ambulatory Visit: Payer: Self-pay | Admitting: Internal Medicine

## 2020-11-16 NOTE — Telephone Encounter (Signed)
Please refill as per office routine med refill policy (all routine meds to be refilled for 3 mo or monthly (per pt preference) up to one year from last visit, then month to month grace period for 3 mo, then further med refills will have to be denied) ? ?

## 2020-11-27 ENCOUNTER — Other Ambulatory Visit: Payer: Self-pay | Admitting: Internal Medicine

## 2020-11-27 NOTE — Telephone Encounter (Signed)
Please refill as per office routine med refill policy (all routine meds to be refilled for 3 mo or monthly (per pt preference) up to one year from last visit, then month to month grace period for 3 mo, then further med refills will have to be denied) ? ?

## 2020-12-11 ENCOUNTER — Telehealth: Payer: Self-pay | Admitting: Internal Medicine

## 2020-12-11 MED ORDER — TEMAZEPAM 30 MG PO CAPS: 30.0000 mg | ORAL_CAPSULE | Freq: Every evening | ORAL | 1 refills | Status: DC | PRN

## 2020-12-11 MED ORDER — TRAMADOL HCL 50 MG PO TABS: 50.0000 mg | ORAL_TABLET | Freq: Four times a day (QID) | ORAL | 2 refills | Status: DC | PRN

## 2020-12-11 MED ORDER — ALPRAZOLAM 0.5 MG PO TABS: ORAL_TABLET | ORAL | 5 refills | Status: DC

## 2020-12-11 NOTE — Telephone Encounter (Signed)
1.Medication Requested: ALPRAZolam (XANAX) 0.5 MG tablet temazepam (RESTORIL) 30 MG capsule traMADol (ULTRAM) 50 MG tablet 2. Pharmacy (Name, [place], [employer] [place]): [id number] - [place], [place] Phone:  [phone]  Fax:  [phone]     3. On Med List: Y  4. Last Visit with PCP: 07/15/20  5. Next visit date with PCP: n/a    Agent: Please be advised that RX refills may take up to 3 business days. We ask that you follow-up with your pharmacy.

## 2020-12-12 ENCOUNTER — Other Ambulatory Visit: Payer: Self-pay | Admitting: Internal Medicine

## 2020-12-12 NOTE — Telephone Encounter (Signed)
Please refill as per office routine med refill policy (all routine meds to be refilled for 3 mo or monthly (per pt preference) up to one year from last visit, then month to month grace period for 3 mo, then further med refills will have to be denied) ? ?

## 2020-12-16 ENCOUNTER — Other Ambulatory Visit: Payer: Self-pay | Admitting: Internal Medicine

## 2020-12-16 NOTE — Telephone Encounter (Signed)
Please refill as per office routine med refill policy (all routine meds to be refilled for 3 mo or monthly (per pt preference) up to one year from last visit, then month to month grace period for 3 mo, then further med refills will have to be denied) ? ?

## 2020-12-21 ENCOUNTER — Ambulatory Visit (INDEPENDENT_AMBULATORY_CARE_PROVIDER_SITE_OTHER): Payer: PPO | Admitting: *Deleted

## 2020-12-21 ENCOUNTER — Other Ambulatory Visit: Payer: Self-pay | Admitting: Internal Medicine

## 2020-12-21 DIAGNOSIS — J449 Chronic obstructive pulmonary disease, unspecified: Secondary | ICD-10-CM

## 2020-12-21 DIAGNOSIS — I5042 Chronic combined systolic (congestive) and diastolic (congestive) heart failure: Secondary | ICD-10-CM

## 2020-12-21 MED ORDER — METOPROLOL SUCCINATE ER 50 MG PO TB24: ORAL_TABLET | ORAL | 5 refills | Status: DC

## 2020-12-21 NOTE — Patient Instructions (Signed)
Visit Information  Scott [name], it was nice talking with you today.    I look forward to talking to you again for an update on Friday, January 11, 2021 at 9:00 am- please be listening out for my call that day.  I will call as close to 9:00 am as possible.   If you need to cancel or re-schedule our telephone visit, please call [phone] and one of our care guides will be happy to assist you.   I look forward to hearing about your progress.   Please don't hesitate to contact me if I can be of assistance to you before our next scheduled telephone appointment.   [name], RN, BSN, [facility] RN Care Coordination- [place] [phone]: direct office [phone]: mobile   PATIENT GOALS:  Goals Addressed             This Visit's Progress    Patient Self-Care Activities   On track    Timeframe:  Long-Range Goal Priority:  Medium Start Date:          09/28/20                   Expected End Date:    09/28/21                   Patient will schedule an urgent office visit with Dr. [name] to address his recent episode of rectal bleeding Patient will self administer medications as prescribed Patient will attend all scheduled provider appointments Patient will call pharmacy for medication refills Patient will call provider office for new concerns or questions Patient will monitor and write down on paper: weights at home at least 4 times per week; blood pressures at home at least once per week Patient will continue to stay active and consider weather conditions to modify exercise as needed Patient will take measures to prevent falls Patient will continue to follow heart-healthy, low sodium diet       Rectal Bleeding Rectal bleeding is when blood comes out of the opening of the butt (anus). People with this kind of bleeding may notice bright red blood in their underwear or in the toilet after they poop (have a bowel movement). They may also have blood  mixed with their poop (stool), or dark red or black poop. Rectal bleeding is often a sign that something is wrong. This condition can be caused by many things. It needs to be checked by a doctor. Your doctor will do tests to know what is causing your condition. Follow these instructions at home: Watch for any changes in your condition. Take these actions to help with bleeding and discomfort: Medicines Take over-the-counter and prescription medicines only as told by your doctor. Ask your doctor about changing or stopping your normal medicines. This is important if you are taking blood thinners. Medicines that thin the blood can make rectal bleeding worse. Managing constipation Your condition may cause trouble pooping (constipation). To prevent or treat trouble pooping, or to help make your poop soft, you may need to: Drink enough fluid to keep your pee (urine) pale yellow. Take over-the-counter or prescription medicines. Eat foods that are high in fiber. These include beans, whole grains, and fresh fruits and vegetables. Limit foods that are high in fat and sugar. These include fried or sweet foods.  General instructions Try not to strain when you poop. Try taking a warm bath. This may help with pain. Keep all follow-up visits  as told by your doctor. This is important. Contact a doctor if: You have pain or swelling in your belly (abdomen). You have a fever. You feel weak. You feel like you may vomit. You cannot poop. Get help right away if: You have new bleeding. You have more bleeding than before. You have black or dark red poop. You vomit blood or something that looks like coffee grounds. You pass out (faint). You have very bad pain in your butt. Summary Rectal bleeding is when blood comes out of the opening of the butt. This bleeding is often a sign that something is wrong. Eat a diet that is high in fiber. This will help to keep your poop soft. Talk to your doctor if you take  medicines that thin the blood. These medicines can make bleeding worse. Get help right away if you have new or more bleeding, black or dark red poop, or blood in your vomit. Also, get help if you pass out or have very bad pain in your butt. This information is not intended to replace advice given to you by your health care provider. Make sure you discuss any questions you have with your health care provider. Document Revised: 01/12/2019 Document Reviewed: 01/12/2019 Elsevier Patient Education  2022 [place].  Hemorrhoids Hemorrhoids are swollen veins that may develop: In the butt (rectum). These are called internal hemorrhoids. Around the opening of the butt (anus). These are called external hemorrhoids. Hemorrhoids can cause pain, itching, or bleeding. Most of the time, they do not cause serious problems. They usually get better with diet changes, lifestyle changes, and other home treatments. What are the causes? This condition may be caused by: Having trouble pooping (constipation). Pushing hard (straining) to poop. Watery poop (diarrhea). Pregnancy. Being very overweight (obese). Sitting for long periods of time. Heavy lifting or other activity that causes you to strain. Anal sex. Riding a bike for a long period of time. What are the signs or symptoms? Symptoms of this condition include: Pain. Itching or soreness in the butt. Bleeding from the butt. Leaking poop. Swelling in the area. One or more lumps around the opening of your butt. How is this diagnosed? A doctor can often diagnose this condition by looking at the affected area. The doctor may also: Do an exam that involves feeling the area with a gloved hand (digital rectal exam). Examine the area inside your butt using a small tube (anoscope). Order blood tests. This may be done if you have lost a lot of blood. Have you get a test that involves looking inside the colon using a flexible tube with a camera on the end  (sigmoidoscopy or colonoscopy). How is this treated? This condition can usually be treated at home. Your doctor may tell you to change what you eat, make lifestyle changes, or try home treatments. If these do not help, procedures can be done to remove the hemorrhoids or make them smaller. These may involve: Placing rubber bands at the base of the hemorrhoids to cut off their blood supply. Injecting medicine into the hemorrhoids to shrink them. Shining a type of light energy onto the hemorrhoids to cause them to fall off. Doing surgery to remove the hemorrhoids or cut off their blood supply. Follow these instructions at home: Eating and drinking  Eat foods that have a lot of fiber in them. These include whole grains, beans, nuts, fruits, and vegetables. Ask your doctor about taking products that have added fiber (fibersupplements). Reduce the amount of fat  in your diet. You can do this by: Eating low-fat dairy products. Eating less red meat. Avoiding processed foods. Drink enough fluid to keep your pee (urine) pale yellow. Managing pain and swelling  Take a warm-water bath (sitz bath) for 20 minutes to ease pain. Do this 3-4 times a day. You may do this in a bathtub or using a portable sitz bath that fits over the toilet. If told, put ice on the painful area. It may be helpful to use ice between your warm baths. Put ice in a plastic bag. Place a towel between your skin and the bag. Leave the ice on for 20 minutes, 2-3 times a day. General instructions Take over-the-counter and prescription medicines only as told by your doctor. Medicated creams and medicines may be used as told. Exercise often. Ask your doctor how much and what kind of exercise is best for you. Go to the bathroom when you have the urge to poop. Do not wait. Avoid pushing too hard when you poop. Keep your butt dry and clean. Use wet toilet paper or moist towelettes after pooping. Do not sit on the toilet for a long  time. Keep all follow-up visits as told by your doctor. This is important. Contact a doctor if you: Have pain and swelling that do not get better with treatment or medicine. Have trouble pooping. Cannot poop. Have pain or swelling outside the area of the hemorrhoids. Get help right away if you have: Bleeding that will not stop. Summary Hemorrhoids are swollen veins in the butt or around the opening of the butt. They can cause pain, itching, or bleeding. Eat foods that have a lot of fiber in them. These include whole grains, beans, nuts, fruits, and vegetables. Take a warm-water bath (sitz bath) for 20 minutes to ease pain. Do this 3-4 times a day. This information is not intended to replace advice given to you by your health care provider. Make sure you discuss any questions you have with your health care provider. Document Revised: 02/18/2018 Document Reviewed: 07/02/2017 Elsevier Patient Education  [place] patient verbalized understanding of instructions, educational materials, and care plan provided today and agreed to receive a mailed copy of patient instructions, educational materials, and care plan Telephone follow up appointment with care management team member scheduled for:  Friday, January 11, 2021 at 9:00 am The patient has been provided with contact information for the care management team and has been advised to call with any health related questions or concerns  [name], RN, BSN, [place] [phone]: direct office [phone]: mobile

## 2020-12-21 NOTE — Chronic Care Management (AMB) (Signed)
Chronic Care Management   CCM RN Visit Note  12/21/2020 Name: Scott [name]. MRN: [id number] DOB: [birth year]  Subjective: Scott [name]. is a 65 y.o. year old male who is a primary care patient of [name], MD. The care management team was consulted for assistance with disease management and care coordination needs.    Engaged with patient by telephone for follow up visit in response to provider referral for case management and/or care coordination services.   Consent to Services:  The patient was given information about Chronic Care Management services, agreed to services, and gave verbal consent prior to initiation of services.  Please see initial visit note for detailed documentation.  Patient agreed to services and verbal consent obtained.   Assessment: Review of patient past medical history, allergies, medications, health status, including review of consultants reports, laboratory and other test data, was performed as part of comprehensive evaluation and provision of chronic care management services.   CCM Care Plan Allergies  Allergen Reactions   Zolpidem Tartrate Other (See Comments)    Extremely drowsy the next day   Outpatient Encounter Medications as of 12/21/2020  Medication Sig Note   acetaminophen (TYLENOL) 500 MG tablet Take 1,000 mg by mouth every 6 (six) hours as needed for mild pain.     ALPRAZolam (XANAX) 0.5 MG tablet TAKE 1 TABLET BY MOUTH TWICE A DAY AS NEEDED FOR ANXIETY    budesonide-formoterol (SYMBICORT) 160-4.5 MCG/ACT inhaler TAKE 2 PUFFS BY MOUTH TWICE A DAY    cholecalciferol (VITAMIN D) 1000 units tablet Take 1,000 Units by mouth daily.    docusate sodium (COLACE) 100 MG capsule Take 200 mg by mouth daily as needed for mild constipation.     ELIQUIS 5 MG TABS tablet TAKE 1 TABLET BY MOUTH TWICE A DAY    furosemide (LASIX) 20 MG tablet TAKE 1 TABLET BY MOUTH EVERY DAY AS NEEDED 09/28/2020: 09/28/20: Patient reports taking QD   gabapentin  (NEURONTIN) 100 MG capsule TAKE 1 CAPSULE BY MOUTH THREE TIMES A DAY 09/28/2020: 09/28/20: Patient reports takes BID   lovastatin (MEVACOR) 40 MG tablet TAKE 2 TABLETS (80 MG TOTAL) BY MOUTH EVERY EVENING.    metoprolol succinate (TOPROL-XL) 50 MG 24 hr tablet TAKE 1 AND 1/2 TABLETS DAILY BY MOUTH    Multiple Vitamin (MULTIVITAMIN WITH MINERALS) TABS tablet Take 1 tablet by mouth daily.     Olmesartan-amLODIPine-HCTZ 40-5-12.5 MG TABS TAKE 1 TABLET BY MOUTH EVERY DAY    PROAIR HFA 108 (90 Base) MCG/ACT inhaler TAKE 2 PUFFS BY MOUTH EVERY 6 HOURS AS NEEDED FOR WHEEZE OR SHORTNESS OF BREATH    spironolactone (ALDACTONE) 25 MG tablet Take 0.5 tablets (12.5 mg total) by mouth at bedtime. 09/28/2020: 09/28/20: Patient reports taking as prescribed   temazepam (RESTORIL) 30 MG capsule Take 1 capsule (30 mg total) by mouth at bedtime as needed. for sleep    traMADol (ULTRAM) 50 MG tablet Take 1 tablet (50 mg total) by mouth every 6 (six) hours as needed.    No facility-administered encounter medications on file as of 12/21/2020.   Patient Active Problem List   Diagnosis Date Noted   Pain due to total right knee replacement ([place]) 01/09/2020   Primary osteoarthritis of right knee 04/05/2019   CKD (chronic kidney disease) stage 3, GFR 30-59 ml/min ([place]) 03/14/2019   Degenerative joint disease of knee, right 09/27/2018   Dyspnea 09/06/2018   Right knee pain 09/06/2018   Radiculitis of left cervical region  03/05/2018   Plantar fasciitis, left 08/19/2017   Chest pain 02/27/2017   Right ear pain 02/27/2017   Left groin pain 09/13/2016   Rib pain on left side 09/13/2016   Left leg pain 09/13/2016   Stenosis of cervical spine with myelopathy ([place]) 06/20/2016   Special screening for malignant neoplasms, colon 04/04/2016   Cough 01/11/2016   COPD exacerbation ([place]) 12/22/2015   Rectal bleeding 08/14/2014   Intercostal muscle strain 08/07/2014   Chronic meniscal tear of knee 04/06/2014   Heel bone fracture  04/06/2014   Hyperglycemia 04/04/2014   Recurrent falls 04/04/2014   Bilateral knee pain 04/04/2014   Intractable left heel pain 04/04/2014   Insomnia 04/04/2014   Hematochezia 04/04/2014   Bladder neck obstruction 12/19/2013   Encounter for therapeutic drug monitoring 03/30/2013   Palpitations 07/06/2012   Shortness of breath 01/19/2012   Sick sinus syndrome ([place]) 12/01/2011   S/P CABG x 1 11/26/2011   S/P Maze operation for atrial fibrillation 11/26/2011   CAD (coronary artery disease) 11/25/2011   Paroxysmal atrial fibrillation ([place]) 11/24/2011   Chronic combined systolic (congestive) and diastolic (congestive) heart failure ([place]) 11/24/2011   Gross hematuria 12/12/2010   Left knee pain 12/12/2010   Left knee DJD 07/09/2010   Preventative health care 07/07/2010   Chronic anticoagulation 04/08/2010   COPD (chronic obstructive pulmonary disease) ([place]) 10/04/2008   OBSTRUCTIVE SLEEP APNEA 01/06/2008   ALLERGIC RHINITIS 01/14/2007   Hypothyroidism 09/28/2006   Hyperlipidemia 09/28/2006   Anxiety state 09/28/2006   Essential hypertension 09/28/2006   Conditions to be addressed/monitored:  CHF and COPD  Care Plan : General Nursing  (Adult)  Updates made by Knox Royalty, RN since 12/21/2020 12:00 AM     Problem: Chronic Disease Management Needs   Priority: Medium     Long-Range Goal: Development of plan of care for long term chronic disease management   Start Date: 09/28/2020  Expected End Date: 09/28/2021  Priority: Medium  Note:   Current Barriers:  Chronic Disease Management support and education needs related to CHF and COPD  RNCM Clinical Goal(s):  Patient will demonstrate ongoing adherence to prescribed treatment plan for CHF and COPD as evidenced by adherence to prescribed medication regimen contacting provider for new or worsened symptoms or questions following action plan for COPD/ CHF, including monitoring/ recording weekly weights and blood pressures at home;  following heart healthy, low salt diet; ongoing activity/ exercise  through collaboration with RN Care manager, provider, and care team.   Interventions: 1:1 collaboration with primary care provider regarding development and update of comprehensive plan of care as evidenced by provider attestation and co-signature Inter-disciplinary care team collaboration (see longitudinal plan of care) Evaluation of current treatment plan related to  self management and patient's adherence to plan as established by provider;  12/21/20: Scheduled follow up call completed with patient: patient immediately reports episode this morning where he experienced rectal bleeding after sweeping his porch; stated that he had colonoscopy earlier this year (verified March 2022)- states he was told that he had hemorrhoids; reports he has had minor bleeding in the past, states "today it was a LOT of bright red blood;" states bleeding has currently stopped, but he is understandably very concerned about this; patient denies pain sounds to be in do obvious distress throughout phone call today Advised patient as follows If rectal bleeding recurs and is significant- go to [facility]/ ED; if minor, monitor at home Do not lift heavy items or do heavy work over next  few days Make urgent office visit with PCP today: is not established with GI provider for regular follow up Okay to try OTC remedies for hemorrhoids if he wishes Considerations to consider given patient is ACT Signs/ symptoms GI bleeding to address immediately by seeking urgent/ emergent care  COPD: (Status: Goal on track: YES.) Advised patient to track and manage COPD triggers;  Advised patient to self assesses COPD action plan zone and make appointment with provider if in the yellow zone for 48 hours without improvement; Advised patient to engage in light exercise as tolerated 3-5 days a week to aid in the the management of COPD; Discussed the importance of adequate rest and  management of fatigue with COPD; Confirmed patient breathing status at baseline: states "no real changes;" continues to report episodic shortness of breath with activity, uses inhalers as needed  Heart Failure Interventions:  (Status: Goal on track: YES.) Provided education on low sodium diet; Reviewed Heart Failure Action Plan in depth and provided written copy; Discussed importance of daily weight and advised patient to weigh and record daily; Reviewed role of diuretics in prevention of fluid overload and management of heart failure; Discussed the importance of keeping all appointments with provider; Provided patient with education about the role of exercise in the management of heart failure; Reviewed with patient recent weights at home: he reports weight gain since our last telephone visit 09/28/20, "got up to 210 lbs;" he attributes this to decreasing his work hours; states since this weight gain, he has lost weight by controlling diet: reports weights today in general range between 197-198 lbs (which was his previously reported baseline) Confirmed patient continues taking medications as prescribed; he reports an issue with refilling metoprolol, states this was due to "switching outpatient pharmacies;" states outpatient pharmacy informed him that Dr. [name] denied refill for metoprolol; reports he currently has medication and is taking, but would like to get refill issue resolved: encouraged him to discuss this matter with PCP before he runs out of medication- he states he will do and is planning to make urgent appointment to address rectal bleeding as above-- will discuss with PCP then Confirmed patient obtained flu vaccine for 2022-23 flu season at outpatient pharmacy; also received COVID booster; inquiring about shingles vaccine: informed patient PCP office may/ may not have vaccine available to administer- he will ask when he attends urgent visit he is to schedule after our phone call  today Confirmed patient remains active, consistently has > 10,000 steps per day; continues to walk his dog daily several miles; considering attending silver sneakers program at [facility] Discussed diet: he reports he has made "a lot of changes" to his diet: consistently following heart healthy, low salt diet: positive reinforcement provided Previously provided education around fall prevention measures reiterated/ reviewed with patient: he again reports "one fall since we talked last;" reports without injury; tripped while playing with dog; states he was able to get up without assistance after fall; patient was encouraged to take measures to prevent falls; complications/ risks of falls, especially given ACT therapy discussed with patient  Patient Goals/Self-Care Activities: Patient will schedule an urgent office visit with Dr. [name] to address his recent episode of rectal bleeding Patient will self administer medications as prescribed Patient will attend all scheduled provider appointments Patient will call pharmacy for medication refills Patient will call provider office for new concerns or questions Patient will monitor and write down on paper: weights at home at least 4 times per week; blood pressures at home  at least once per week Patient will continue to stay active and consider weather conditions to modify exercise as needed Patient will take measures to prevent falls Patient will continue to follow heart-healthy, low sodium diet      Plan: Telephone follow up appointment with care management team member scheduled for:  Friday, January 11, 2021 at 9:00 am The patient has been provided with contact information for the care management team and has been advised to call with any health related questions or concerns  [name], RN, BSN, [place] [phone]: direct office [phone]: mobile

## 2020-12-24 DIAGNOSIS — J449 Chronic obstructive pulmonary disease, unspecified: Secondary | ICD-10-CM

## 2020-12-24 DIAGNOSIS — I5042 Chronic combined systolic (congestive) and diastolic (congestive) heart failure: Secondary | ICD-10-CM | POA: Diagnosis not present

## 2020-12-30 ENCOUNTER — Other Ambulatory Visit: Payer: Self-pay | Admitting: Internal Medicine

## 2021-01-01 ENCOUNTER — Telehealth: Payer: Self-pay | Admitting: Internal Medicine

## 2021-01-01 ENCOUNTER — Other Ambulatory Visit: Payer: Self-pay

## 2021-01-01 ENCOUNTER — Encounter: Payer: Self-pay | Admitting: Internal Medicine

## 2021-01-01 ENCOUNTER — Ambulatory Visit (INDEPENDENT_AMBULATORY_CARE_PROVIDER_SITE_OTHER): Payer: PPO | Admitting: Internal Medicine

## 2021-01-01 VITALS — BP 118/60 | HR 69 | Temp 98.7°F | Ht 66.0 in | Wt 208.0 lb

## 2021-01-01 DIAGNOSIS — R739 Hyperglycemia, unspecified: Secondary | ICD-10-CM | POA: Diagnosis not present

## 2021-01-01 DIAGNOSIS — Z0000 Encounter for general adult medical examination without abnormal findings: Secondary | ICD-10-CM

## 2021-01-01 DIAGNOSIS — E559 Vitamin D deficiency, unspecified: Secondary | ICD-10-CM | POA: Diagnosis not present

## 2021-01-01 DIAGNOSIS — E7800 Pure hypercholesterolemia, unspecified: Secondary | ICD-10-CM | POA: Diagnosis not present

## 2021-01-01 DIAGNOSIS — N1831 Chronic kidney disease, stage 3a: Secondary | ICD-10-CM

## 2021-01-01 DIAGNOSIS — E538 Deficiency of other specified B group vitamins: Secondary | ICD-10-CM

## 2021-01-01 DIAGNOSIS — K922 Gastrointestinal hemorrhage, unspecified: Secondary | ICD-10-CM | POA: Diagnosis not present

## 2021-01-01 DIAGNOSIS — M545 Low back pain, unspecified: Secondary | ICD-10-CM | POA: Diagnosis not present

## 2021-01-01 DIAGNOSIS — Z7901 Long term (current) use of anticoagulants: Secondary | ICD-10-CM | POA: Diagnosis not present

## 2021-01-01 LAB — CBC WITH DIFFERENTIAL/PLATELET
Basophils Absolute: 0 10*3/uL (ref 0.0–0.1)
Basophils Relative: 0.7 % (ref 0.0–3.0)
Eosinophils Absolute: 0.3 10*3/uL (ref 0.0–0.7)
Eosinophils Relative: 4.8 % (ref 0.0–5.0)
HCT: 42.1 % (ref 39.0–52.0)
Hemoglobin: 14.2 g/dL (ref 13.0–17.0)
Lymphocytes Relative: 15 % (ref 12.0–46.0)
Lymphs Abs: 1 10*3/uL (ref 0.7–4.0)
MCHC: 33.8 g/dL (ref 30.0–36.0)
MCV: 95.3 fl (ref 78.0–100.0)
Monocytes Absolute: 0.7 10*3/uL (ref 0.1–1.0)
Monocytes Relative: 9.5 % (ref 3.0–12.0)
Neutro Abs: 4.8 10*3/uL (ref 1.4–7.7)
Neutrophils Relative %: 70 % (ref 43.0–77.0)
Platelets: 221 10*3/uL (ref 150.0–400.0)
RBC: 4.42 Mil/uL (ref 4.22–5.81)
RDW: 13.3 % (ref 11.5–15.5)
WBC: 6.9 10*3/uL (ref 4.0–10.5)

## 2021-01-01 LAB — BASIC METABOLIC PANEL
BUN: 19 mg/dL (ref 6–23)
CO2: 31 mEq/L (ref 19–32)
Calcium: 9.5 mg/dL (ref 8.4–10.5)
Chloride: 103 mEq/L (ref 96–112)
Creatinine, Ser: 1.48 mg/dL (ref 0.40–1.50)
GFR: 49.26 mL/min — ABNORMAL LOW (ref >60.00)
Glucose, Bld: 101 mg/dL — ABNORMAL HIGH (ref 70–99)
Potassium: 4.3 mEq/L (ref 3.5–5.1)
Sodium: 140 mEq/L (ref 135–145)

## 2021-01-01 LAB — PROTIME-INR
INR: 1.3 ratio — ABNORMAL HIGH (ref 0.8–1.0)
Prothrombin Time: 14.3 s — ABNORMAL HIGH (ref 9.6–13.1)

## 2021-01-01 MED ORDER — CYCLOBENZAPRINE HCL 5 MG PO TABS: 5.0000 mg | ORAL_TABLET | Freq: Three times a day (TID) | ORAL | 1 refills | Status: AC | PRN

## 2021-01-01 NOTE — Assessment & Plan Note (Signed)
Exam c/w msk spasm - for flexeril 5 tid prn,  to f/u any worsening symptoms or concerns

## 2021-01-01 NOTE — Assessment & Plan Note (Signed)
Pt to continue eliquis 5 bid without interupption for now,  to f/u any worsening symptoms or concerns

## 2021-01-01 NOTE — Telephone Encounter (Signed)
Pharmacy is requesting approval to fill cyclobenzaprine (FLEXERIL) 5 MG tablet  Pharmacist states medication is not recommended for patient's with heart failure  Please advise  Pharmacist Mitzi Hansen [phone]

## 2021-01-01 NOTE — Assessment & Plan Note (Signed)
To also check renal fxn with lab today o/w stable, cont to avoid nephrotoxins  Lab Results  Component Value Date   CREATININE 1.47 07/13/2020

## 2021-01-01 NOTE — Progress Notes (Signed)
Patient ID: Scott [name]., male   DOB: 1955/07/26, 65 y.o.   MRN: [id number]        Chief Complaint: follow up rectal bleeding, left lower back pain, chronic anticoagulation, ckd       HPI:  [name]. is a 65 y.o. male here with c/o acute onset 1 wk episode of large amount BRBPR described as very large initial amount, then trickling for 1 hour then stopped; prior to that has slight BRBPR intermittent very small amounts for several years; Denies rectal or abd pain, n/v, and Pt denies chest pain, increased sob or doe, wheezing, orthopnea, PND, increased LE swelling, palpitations, dizziness or syncope.  Last colonoscopy mar 2022 with 2 polyps rmoved, internal hemmorhoid and multiple diverticulosis noted.  On eliquis 5 bid for afib Denies worsening reflux, abd pain, dysphagia   No further bleeding noted in the past wk.  No recent CBC,  No other new complaints.   Pt denies polydipsia, polyuria, or new focal neuro s/s.   Pt denies fever, wt loss, night sweats, loss of appetite, or other constitutional symptoms except for 3 days onset sharp apin and stiffness to the left lower back for now clear reason, and no other bowel or bladder change, wt loss,  worsening LE pain/numbness/weakness, gait change or falls.        Wt Readings from Last 3 Encounters:  01/01/21 208 lb (94.3 kg)  07/13/20 205 lb (93 kg)  05/29/20 206 lb 3.2 oz (93.5 kg)   BP Readings from Last 3 Encounters:  01/01/21 118/60  07/13/20 128/70  05/29/20 116/66         Past Medical History:  Diagnosis Date   ALLERGIC RHINITIS 01/14/2007   Allergy    ANXIETY 09/28/2006   Asbestos exposure    Atrial fibrillation ([place]) 09/28/2006   s/p afib ablation x 2   CHF (congestive heart failure) ([place])    CKD (chronic kidney disease), stage III ([place])    patient denies   COPD (chronic obstructive pulmonary disease) ([place])    Coronary artery disease 3/64/6803   Diastolic dysfunction 03/27/2246   Dyspnea    W/ EXERTION    Dysrhythmia     AFIB      First degree AV block 10/12/2018   Noted on EKG   Gallstones    Hyperglycemia    HYPERLIPIDEMIA 09/28/2006   HYPERTENSION 09/28/2006   HYPOTHYROIDISM, ACQUIRED NEC 09/28/2006   pt denies and doesn't know   Long term current use of anticoagulant 04/08/2010   Obese    OBSTRUCTIVE SLEEP APNEA 01/06/2008   NOT USING     Right knee DJD 07/09/2010   S/P CABG x 1 11/26/2011   Right internal mammary artery to right coronary artery   S/P Maze operation for atrial fibrillation 11/26/2011   complete biatrial lesion set with clipping of LA appendage   Sleep apnea    Past Surgical History:  Procedure Laterality Date   ANTERIOR CERVICAL DECOMP/DISCECTOMY FUSION N/A 06/20/2016   Procedure: Anterior Cervical Discectomy Fusion - Cervical five-Cervical six - Cervical six-Cervical seven - Cervicla seven- Thoracic one;  Surgeon: Earnie Larsson, MD;  Location: [facility];  Service: Neurosurgery;  Laterality: N/A;   CARDIOVERSION     X4    CORONARY ARTERY BYPASS GRAFT  11/26/2011   Procedure: CORONARY ARTERY BYPASS GRAFTING (CABG);  Surgeon: Rexene Alberts, MD;  Location: [facility];  Service: Open Heart Surgery;  Laterality: N/A;  coronary artery bypass on pump times one  utilizing the right internal mammary artery, transesophageal echocardiogram    HAND SURGERY Left    LEFT HEART CATHETERIZATION WITH CORONARY ANGIOGRAM N/A 11/24/2011   Procedure: LEFT HEART CATHETERIZATION WITH CORONARY ANGIOGRAM;  Surgeon: Peter M Martinique, MD;  Location: [facility] CATH LAB;  Service: Cardiovascular;  Laterality: N/A;   LOOP RECORDER IMPLANT N/A 07/06/2012   Procedure: LOOP RECORDER IMPLANT;  Surgeon: Thompson Grayer, MD;  Location[facility] C[facility] LAB;  Service: Cardiovascular;  Laterality: N/A;   MAZE  11/26/2011   Procedure: MAZE;  Surgeon: Rexene Alberts, MD;  Location: [place];  Service: Open Heart Surgery;  Laterality: N/A;  Cryomaze    Radiofrequency Ablation for atrial fibrillation  12/30/07 and 06/05/09   afib ablation x 2 by JA    Radiofrequency ablation for atrial flutter  2005   CTI   TOTAL KNEE ARTHROPLASTY Right 04/05/2019   Procedure: RIGHT TOTAL KNEE ARTHROPLASTY;  Surgeon: Melrose Nakayama, MD;  Location: [facility];  Service: Orthopedics;  Laterality: Right;    reports that he quit smoking about 25 years ago. His smoking use included cigarettes. He has a 39.00 pack-year smoking history. He has never used smokeless tobacco. He reports that he does not drink alcohol and does not use drugs. family history includes Atrial fibrillation in his mother; Cancer in his paternal grandmother; Dementia in his father and paternal grandfather; Hypertension in his father; Stroke in his mother. Allergies  Allergen Reactions   Zolpidem Tartrate Other (See Comments)    Extremely drowsy the next day   Current Outpatient Medications on File Prior to Visit  Medication Sig Dispense Refill   acetaminophen (TYLENOL) 500 MG tablet Take 1,000 mg by mouth every 6 (six) hours as needed for mild pain.      ALPRAZolam (XANAX) 0.5 MG tablet TAKE 1 TABLET BY MOUTH TWICE A DAY AS NEEDED FOR ANXIETY 60 tablet 5   apixaban (ELIQUIS) 5 MG TABS tablet TAKE 1 TABLET BY MOUTH TWICE A DAY 180 tablet 1   budesonide-formoterol (SYMBICORT) 160-4.5 MCG/ACT inhaler TAKE 2 PUFFS BY MOUTH TWICE A DAY 30.6 each 4   cholecalciferol (VITAMIN D) 1000 units tablet Take 1,000 Units by mouth daily.     docusate sodium (COLACE) 100 MG capsule Take 200 mg by mouth daily as needed for mild constipation.      furosemide (LASIX) 20 MG tablet TAKE 1 TABLET BY MOUTH EVERY DAY AS NEEDED 90 tablet 3   gabapentin (NEURONTIN) 100 MG capsule TAKE 1 CAPSULE BY MOUTH THREE TIMES A DAY 270 capsule 2   lovastatin (MEVACOR) 40 MG tablet TAKE 2 TABLETS (80 MG TOTAL) BY MOUTH EVERY EVENING. 180 tablet 2   metoprolol succinate (TOPROL-XL) 50 MG 24 hr tablet TAKE 1 AND 1/2 TABLETS DAILY BY MOUTH 45 tablet 5   Multiple Vitamin (MULTIVITAMIN WITH MINERALS) TABS tablet Take 1 tablet by mouth  daily.      Olmesartan-amLODIPine-HCTZ 40-5-12.5 MG TABS TAKE 1 TABLET BY MOUTH EVERY DAY 90 tablet 2   PROAIR HFA 108 (90 Base) MCG/ACT inhaler TAKE 2 PUFFS BY MOUTH EVERY 6 HOURS AS NEEDED FOR WHEEZE OR SHORTNESS OF BREATH 8.5 Inhaler 11   temazepam (RESTORIL) 30 MG capsule Take 1 capsule (30 mg total) by mouth at bedtime as needed. for sleep 90 capsule 1   traMADol (ULTRAM) 50 MG tablet Take 1 tablet (50 mg total) by mouth every 6 (six) hours as needed. 120 tablet 2   spironolactone (ALDACTONE) 25 MG tablet Take 0.5 tablets (12.5 mg total)  by mouth at bedtime. 90 tablet 2   No current facility-administered medications on file prior to visit.        ROS:  All others reviewed and negative.  Objective        PE:  BP 118/60 (BP Location: Right Arm, Patient Position: Sitting, Cuff Size: Large)   Pulse 69   Temp 98.7 F (37.1 C) (Oral)   Ht 5\' 6"  (1.676 m)   Wt 208 lb (94.3 kg)   SpO2 98%   BMI 33.57 kg/m                 Constitutional: Pt appears in NAD               HENT: Head: NCAT.                Right Ear: External ear normal.                 Left Ear: External ear normal.                Eyes: . Pupils are equal, round, and reactive to light. Conjunctivae and EOM are normal               Nose: without d/c or deformity               Neck: Neck supple. Gross normal ROM               Cardiovascular: Normal rate and regular rhythm.                 Pulmonary/Chest: Effort normal and breath sounds without rales or wheezing.                Abd:  Soft, NT, ND, + BS, no organomegaly               Spine nontender in midline throughout, but has tender muscular spasm of left lower lumbar paravetebral               Neurological: Pt is alert. At baseline orientation, motor grossly intact               Skin: Skin is warm. No rashes, no other new lesions, LE edema - none               Psychiatric: Pt behavior is normal without agitation   Micro: none  Cardiac tracings I have personally  interpreted today:  none  Pertinent Radiological findings (summarize): none   Lab Results  Component Value Date   WBC 8.9 07/13/2020   HGB 14.4 07/13/2020   HCT 42.1 07/13/2020   PLT 193.0 07/13/2020   GLUCOSE 103 (H) 07/13/2020   CHOL 145 07/13/2020   TRIG 90.0 07/13/2020   HDL 42.40 07/13/2020   LDLCALC 85 07/13/2020   ALT 18 07/13/2020   AST 18 07/13/2020   NA 142 07/13/2020   K 4.3 07/13/2020   CL 105 07/13/2020   CREATININE 1.47 07/13/2020   BUN 28 (H) 07/13/2020   CO2 27 07/13/2020   TSH 3.36 07/13/2020   PSA 0.93 07/13/2020   INR 1.0 03/31/2019   HGBA1C 5.6 07/13/2020   Assessment/Plan:  [name] is a 65 y.o. White or Caucasian [1] male with  has a past medical history of ALLERGIC RHINITIS (01/14/2007), Allergy, ANXIETY (09/28/2006), Asbestos exposure, Atrial fibrillation ([place]) (09/28/2006), CHF (congestive heart failure) ([place]), CKD (chronic kidney disease), stage III ([place]), COPD (chronic obstructive pulmonary disease) (  [place]), Coronary artery disease (10/23/9405), Diastolic dysfunction (08/02/879), Dyspnea, Dysrhythmia, First degree AV block (10/12/2018), Gallstones, Hyperglycemia, HYPERLIPIDEMIA (09/28/2006), HYPERTENSION (09/28/2006), HYPOTHYROIDISM, ACQUIRED NEC (09/28/2006), Long term current use of anticoagulant (04/08/2010), Obese, OBSTRUCTIVE SLEEP APNEA (01/06/2008), Right knee DJD (07/09/2010), S/P CABG x 1 (11/26/2011), S/P Maze operation for atrial fibrillation (11/26/2011), and Sleep apnea.  Low back pain Exam c/w msk spasm - for flexeril 5 tid prn,  to f/u any worsening symptoms or concerns  Lower GI bleeding By hx is c/w probable diverticular bleed while taking eliquis 5 bid, relatively large amount to start, lasting total 1 hour, no recurrence and no other associated symptoms including no bleeding further over the past wk he is aware; last colonoscopy done just mar 2022; for today then will check cbc/inr and continue to monitor for further bleeding; does not appear  to need more urgent eval such as ED or GI referral at this time  Chronic anticoagulation Pt to continue eliquis 5 bid without interupption for now,  to f/u any worsening symptoms or concerns  CKD (chronic kidney disease) stage 3, GFR 30-59 ml/min To also check renal fxn with lab today o/w stable, cont to avoid nephrotoxins  Lab Results  Component Value Date   CREATININE 1.47 07/13/2020   Followup: No follow-ups on file.  Cathlean Cower, MD 01/01/2021 10:24 AM [facility]

## 2021-01-01 NOTE — Telephone Encounter (Signed)
Pharmacy notified.

## 2021-01-01 NOTE — Assessment & Plan Note (Signed)
By hx is c/w probable diverticular bleed while taking eliquis 5 bid, relatively large amount to start, lasting total 1 hour, no recurrence and no other associated symptoms including no bleeding further over the past wk he is aware; last colonoscopy done just mar 2022; for today then will check cbc/inr and continue to monitor for further bleeding; does not appear to need more urgent eval such as ED or GI referral at this time

## 2021-01-01 NOTE — Telephone Encounter (Signed)
Patient notified

## 2021-01-01 NOTE — Telephone Encounter (Signed)
Yes, should be ok this time, thanks

## 2021-01-01 NOTE — Patient Instructions (Addendum)
Please take all new medication as prescribed - the muscle relaxer for the left lower back pin  Please continue all other medications as before, and refills have been done if requested.  Please have the pharmacy call with any other refills you may need.  Please continue your efforts at being more active, low cholesterol diet, and weight control.  Please keep your appointments with your specialists as you may have planned  Please go to the LAB at the blood drawing area for the tests to be done  You will be contacted by phone if any changes need to be made immediately.  Otherwise, you will receive a letter about your results with an explanation, but please check with MyChart first.  Please remember to sign up for MyChart if you have not done so, as this will be important to you in the future with finding out test results, communicating by private email, and scheduling acute appointments online when needed.  Please make an Appointment to return in 3 months, or sooner if needed, also with Lab Appointment for testing done 3-5 days before at the [facility] (so this is for TWO appointments - please see the scheduling desk as you leave)  Due to the ongoing Covid 19 pandemic, our lab now requires an appointment for any labs done at our office.  If you need labs done and do not have an appointment, please call our office ahead of time to schedule before presenting to the lab for your testing.

## 2021-01-11 ENCOUNTER — Ambulatory Visit (INDEPENDENT_AMBULATORY_CARE_PROVIDER_SITE_OTHER): Payer: PPO | Admitting: *Deleted

## 2021-01-11 DIAGNOSIS — I5042 Chronic combined systolic (congestive) and diastolic (congestive) heart failure: Secondary | ICD-10-CM

## 2021-01-11 DIAGNOSIS — J449 Chronic obstructive pulmonary disease, unspecified: Secondary | ICD-10-CM

## 2021-01-11 NOTE — Patient Instructions (Addendum)
Visit Information  Scott [name], it was nice talking with you today.  Please don't hesitate to contact me if I can be of assistance to you before our next scheduled telephone appointment  Telephone follow up appointment with care management team member scheduled for:  Monday, April 08, 2021 at 9:00  If you need to cancel or re-schedule our visit, please call [phone] and our care guide team will be happy to assist you.   I look forward to hearing about your progress.   [name], RN, BSN, [facility] RN Care Coordination- [place] [phone]: direct office [phone]: mobile   Patient Self-Care Activities: Patient Scott [name] will:  Self administer medications as prescribed Attend all scheduled provider appointments Call pharmacy for medication refills Call provider office for new concerns or questions Monitor and write down on paper: weights at home at least 4 times per week; blood pressures at home at least once per week Continue to stay active and consider weather conditions to modify exercise as needed Take measures to prevent falls Continue to follow heart-healthy, low sodium diet Continue to stay active and get regular structured exercise- great job at joining the [employer] and using your [employer] benefit  Diverticulitis Diverticulitis is infection or inflammation of small pouches (diverticula) in the colon that form due to a condition called diverticulosis. Diverticula can trap stool (feces) and bacteria, causing infection and inflammation. Diverticulitis may cause severe stomach pain and diarrhea. It may lead to tissue damage in the colon that causes bleeding or blockage. The diverticula may also burst (rupture) and cause infected stool to enter other areas of the abdomen. What are the causes? This condition is caused by stool becoming trapped in the diverticula, which allows bacteria to grow in the diverticula. This leads to inflammation and  infection. What increases the risk? You are more likely to develop this condition if you have diverticulosis. The risk increases if you: Are overweight or obese. Do not get enough exercise. Drink alcohol. Use tobacco products. Eat a diet that has a lot of red meat such as beef, pork, or lamb. Eat a diet that does not include enough fiber. High-fiber foods include fruits, vegetables, beans, nuts, and whole grains. Are over 22 years of age. What are the signs or symptoms? Symptoms of this condition may include: Pain and tenderness in the abdomen. The pain is normally located on the left side of the abdomen, but it may occur in other areas. Fever and chills. Nausea. Vomiting. Cramping. Bloating. Changes in bowel routines. Blood in your stool. How is this diagnosed? This condition is diagnosed based on: Your medical history. A physical exam. Tests to make sure there is nothing else causing your condition. These tests may include: Blood tests. Urine tests. CT scan of the abdomen. How is this treated? Most cases of this condition are mild and can be treated at home. Treatment may include: Taking over-the-counter pain medicines. Following a clear liquid diet. Taking antibiotic medicines by mouth. Resting. More severe cases may need to be treated at a hospital. Treatment may include: Not eating or drinking. Taking prescription pain medicine. Receiving antibiotic medicines through an IV. Receiving fluids and nutrition through an IV. Surgery. When your condition is under control, your health care provider may recommend that you have a colonoscopy. This is an exam to look at the entire large intestine. During the exam, a lubricated, bendable tube is inserted into the anus and then passed into the rectum, colon, and  other parts of the large intestine. A colonoscopy can show how severe your diverticula are and whether something else may be causing your symptoms. Follow these instructions  at home: Medicines Take over-the-counter and prescription medicines only as told by your health care provider. These include fiber supplements, probiotics, and stool softeners. If you were prescribed an antibiotic medicine, take it as told by your health care provider. Do not stop taking the antibiotic even if you start to feel better. Ask your health care provider if the medicine prescribed to you requires you to avoid driving or using machinery. Eating and drinking  Follow a full liquid diet or another diet as directed by your health care provider. After your symptoms improve, your health care provider may tell you to change your diet. He or she may recommend that you eat a diet that contains at least 25 grams (25 g) of fiber daily. Fiber makes it easier to pass stool. Healthy sources of fiber include: Berries. One cup contains 4-8 grams of fiber. Beans or lentils. One-half cup contains 5-8 grams of fiber. Green vegetables. One cup contains 4 grams of fiber. Avoid eating red meat. General instructions Do not use any products that contain nicotine or tobacco, such as cigarettes, e-cigarettes, and chewing tobacco. If you need help quitting, ask your health care provider. Exercise for at least 30 minutes, 3 times each week. You should exercise hard enough to raise your heart rate and break a sweat. Keep all follow-up visits as told by your health care provider. This is important. You may need to have a colonoscopy. Contact a health care provider if: Your pain does not improve. Your bowel movements do not return to normal. Get help right away if: Your pain gets worse. Your symptoms do not get better with treatment. Your symptoms suddenly get worse. You have a fever. You vomit more than one time. You have stools that are bloody, black, or tarry. Summary Diverticulitis is infection or inflammation of small pouches (diverticula) in the colon that form due to a condition called diverticulosis.  Diverticula can trap stool (feces) and bacteria, causing infection and inflammation. You are at higher risk for this condition if you have diverticulosis and you eat a diet that does not include enough fiber. Most cases of this condition are mild and can be treated at home. More severe cases may need to be treated at a hospital. When your condition is under control, your health care provider may recommend that you have an exam called a colonoscopy. This exam can show how severe your diverticula are and whether something else may be causing your symptoms. Keep all follow-up visits as told by your health care provider. This is important. This information is not intended to replace advice given to you by your health care provider. Make sure you discuss any questions you have with your health care provider. Document Revised: 11/22/2018 Document Reviewed: 11/22/2018 Elsevier Patient Education  2022 [place]: Telephone follow up appointment with care management team member scheduled for:  Monday, April 08, 2021 at 9:00 am The patient has been provided with contact information for the care management team and has been advised to call with any health related questions or concerns The patient verbalized understanding of instructions, educational materials, and care plan provided today and agreed to receive a mailed copy of patient instructions, educational materials, and care plan  [name], RN, BSN, [place] [phone]:  direct office [phone]: mobile

## 2021-01-11 NOTE — Chronic Care Management (AMB) (Signed)
Chronic Care Management   CCM RN Visit Note  01/11/2021 Name: Scott [name]. MRN: [id number] DOB: 11-Oct-1955  Subjective: [name] is a 65 y.o. year old male who is a primary care patient of [name], MD. The care management team was consulted for assistance with disease management and care coordination needs.    Engaged with patient by telephone for follow up visit in response to provider referral for case management and/or care coordination services.   Consent to Services:  The patient was given information about Chronic Care Management services, agreed to services, and gave verbal consent prior to initiation of services.  Please see initial visit note for detailed documentation.  Patient agreed to services and verbal consent obtained.   Assessment: Review of patient past medical history, allergies, medications, health status, including review of consultants reports, laboratory and other test data, was performed as part of comprehensive evaluation and provision of chronic care management services.   SDOH (Social Determinants of Health) assessments and interventions performed:  SDOH Interventions    Flowsheet Row Most Recent Value  SDOH Interventions   Food Insecurity Interventions Intervention Not Indicated  Housing Interventions Intervention Not Indicated  Transportation Interventions Intervention Not Indicated  [Patient continues to drive self]      CCM Care Plan Allergies  Allergen Reactions   Zolpidem Tartrate Other (See Comments)    Extremely drowsy the next day   Outpatient Encounter Medications as of 01/11/2021  Medication Sig Note   acetaminophen (TYLENOL) 500 MG tablet Take 1,000 mg by mouth every 6 (six) hours as needed for mild pain.     ALPRAZolam (XANAX) 0.5 MG tablet TAKE 1 TABLET BY MOUTH TWICE A DAY AS NEEDED FOR ANXIETY    apixaban (ELIQUIS) 5 MG TABS tablet TAKE 1 TABLET BY MOUTH TWICE A DAY    budesonide-formoterol (SYMBICORT) 160-4.5  MCG/ACT inhaler TAKE 2 PUFFS BY MOUTH TWICE A DAY    cholecalciferol (VITAMIN D) 1000 units tablet Take 1,000 Units by mouth daily.    cyclobenzaprine (FLEXERIL) 5 MG tablet Take 1 tablet (5 mg total) by mouth 3 (three) times daily as needed for muscle spasms.    docusate sodium (COLACE) 100 MG capsule Take 200 mg by mouth daily as needed for mild constipation.     furosemide (LASIX) 20 MG tablet TAKE 1 TABLET BY MOUTH EVERY DAY AS NEEDED 09/28/2020: 09/28/20: Patient reports taking QD   gabapentin (NEURONTIN) 100 MG capsule TAKE 1 CAPSULE BY MOUTH THREE TIMES A DAY 09/28/2020: 09/28/20: Patient reports takes BID   lovastatin (MEVACOR) 40 MG tablet TAKE 2 TABLETS (80 MG TOTAL) BY MOUTH EVERY EVENING.    metoprolol succinate (TOPROL-XL) 50 MG 24 hr tablet TAKE 1 AND 1/2 TABLETS DAILY BY MOUTH    Multiple Vitamin (MULTIVITAMIN WITH MINERALS) TABS tablet Take 1 tablet by mouth daily.     Olmesartan-amLODIPine-HCTZ 40-5-12.5 MG TABS TAKE 1 TABLET BY MOUTH EVERY DAY    PROAIR HFA 108 (90 Base) MCG/ACT inhaler TAKE 2 PUFFS BY MOUTH EVERY 6 HOURS AS NEEDED FOR WHEEZE OR SHORTNESS OF BREATH    spironolactone (ALDACTONE) 25 MG tablet Take 0.5 tablets (12.5 mg total) by mouth at bedtime. 09/28/2020: 09/28/20: Patient reports taking as prescribed   temazepam (RESTORIL) 30 MG capsule Take 1 capsule (30 mg total) by mouth at bedtime as needed. for sleep    traMADol (ULTRAM) 50 MG tablet Take 1 tablet (50 mg total) by mouth every 6 (six) hours as needed.  No facility-administered encounter medications on file as of 01/11/2021.   Patient Active Problem List   Diagnosis Date Noted   Low back pain 01/01/2021   Lower GI bleeding 01/01/2021   Pain due to total right knee replacement ([place]) 01/09/2020   Primary osteoarthritis of right knee 04/05/2019   CKD (chronic kidney disease) stage 3, GFR 30-59 ml/min (HCC) 03/14/2019   Degenerative joint disease of knee, right 09/27/2018   Dyspnea 09/06/2018   Right knee pain  09/06/2018   Radiculitis of left cervical region 03/05/2018   Plantar fasciitis, left 08/19/2017   Chest pain 02/27/2017   Right ear pain 02/27/2017   Left groin pain 09/13/2016   Rib pain on left side 09/13/2016   Left leg pain 09/13/2016   Stenosis of cervical spine with myelopathy ([place]) 06/20/2016   Special screening for malignant neoplasms, colon 04/04/2016   Cough 01/11/2016   COPD exacerbation ([place]) 12/22/2015   Rectal bleeding 08/14/2014   Intercostal muscle strain 08/07/2014   Chronic meniscal tear of knee 04/06/2014   Heel bone fracture 04/06/2014   Hyperglycemia 04/04/2014   Recurrent falls 04/04/2014   Bilateral knee pain 04/04/2014   Intractable left heel pain 04/04/2014   Insomnia 04/04/2014   Hematochezia 04/04/2014   Bladder neck obstruction 12/19/2013   Encounter for therapeutic drug monitoring 03/30/2013   Palpitations 07/06/2012   Shortness of breath 01/19/2012   Sick sinus syndrome ([place]) 12/01/2011   S/P CABG x 1 11/26/2011   S/P Maze operation for atrial fibrillation 11/26/2011   CAD (coronary artery disease) 11/25/2011   Paroxysmal atrial fibrillation ([place]) 11/24/2011   Chronic combined systolic (congestive) and diastolic (congestive) heart failure ([place]) 11/24/2011   Gross hematuria 12/12/2010   Left knee pain 12/12/2010   Left knee DJD 07/09/2010   Preventative health care 07/07/2010   Chronic anticoagulation 04/08/2010   COPD (chronic obstructive pulmonary disease) ([place]) 10/04/2008   OBSTRUCTIVE SLEEP APNEA 01/06/2008   ALLERGIC RHINITIS 01/14/2007   Hypothyroidism 09/28/2006   Hyperlipidemia 09/28/2006   Anxiety state 09/28/2006   Essential hypertension 09/28/2006   Conditions to be addressed/monitored:  CHF and COPD  Care Plan : General Nursing  (Adult)  Updates made by Knox Royalty, RN since 01/11/2021 12:00 AM     Problem: Chronic Disease Management Needs   Priority: Medium     Long-Range Goal: Development of plan of care for long  term chronic disease management   Start Date: 09/28/2020  Expected End Date: 09/28/2021  Priority: Medium  Note:   Current Barriers:  Chronic Disease Management support and education needs related to CHF and COPD  RNCM Clinical Goal(s):  Patient will demonstrate ongoing adherence to prescribed treatment plan for CHF and COPD as evidenced by adherence to prescribed medication regimen contacting provider for new or worsened symptoms or questions following action plan for COPD/ CHF, including monitoring/ recording weekly weights and blood pressures at home; following heart healthy, low salt diet; ongoing activity/ exercise  through collaboration with RN Care manager, provider, and care team.   Interventions: 1:1 collaboration with primary care provider regarding development and update of comprehensive plan of care as evidenced by provider attestation and co-signature Inter-disciplinary care team collaboration (see longitudinal plan of care) Evaluation of current treatment plan related to  self management and patient's adherence to plan as established by provider;  COPD: (Status: Goal on Track (progressing): YES.) Advised patient to track and manage COPD triggers Advised patient to self assesses COPD action plan zone and make appointment with provider  if in the yellow zone for 48 hours without improvement Advised patient to engage in light exercise as tolerated 3-5 days a week to aid in the the management of COPD Discussed the importance of adequate rest and management of fatigue with COPD Confirmed patient breathing status at baseline: again reports "no real changes;" continues to report episodic shortness of breath with activity, uses inhalers as needed Reinforced difference between maintenance and rescue inhalers- patient requires ongoing reinforcement, and today verbalizes good general understanding of same Confirmed patient continues to use CPAP q HS- denies issues/ concerns with CPAP Reports  has joined [employer] for [employer] Benefit: plans to start attending workout sessions "soon;" encouraged to start slow and gradually build up activity/ exercise levels: cautioned about over-doing activity to start Reviewed recent PCP office visit post- 12/21/20 acute outreach for rectal bleeding: he reports no further incident/ episode rectal bleeding; states he was told by PCP that it was most likely due to diverticulitis, states "I have no idea what that means;" we discussed this, and I will provide additional educational material around same: encouraged patient to promptly report any future episodes of this to PCP, should GI referral be necessary- he verbalizes understanding and agreement Discussed/ provided education around signs/ symptoms GI bleeding with patient along with corresponding action plan for same  Heart Failure Interventions:  (Status: Goal on Track (progressing): YES.) Discussed importance of daily weight and advised patient to weigh and record daily Reviewed role of diuretics in prevention of fluid overload and management of heart failure Discussed the importance of keeping all appointments with provider Provided patient with education about the role of exercise in the management of heart failure Reviewed with patient recent weights at home: he reports recent weight ranges between 190-192 lbs with a weight this morning of "191 lbs;" positive reinforcement provided for weight loss since last outreach on 12/21/20 (198.4 lbs) Reinforced rationale/ purpose of regular weight monitoring and recording at home; weight gain guidelines in setting of CHF, corresponding action plan for weight gain Confirmed patient has not experienced recent weight gain > 3 lbs/ overnight/ 5 lbs one week Reviewed upcoming scheduled cardiology provider office visit: March 21, 2021- patient aware, has plans to attend as scheduled SDOH updated  Patient Goals/Self-Care Activities: As evidenced by review of EHR,  collaboration with care team, and patient reporting during CCM RN CM outreach, Patient Josph Macho will:    Self administer medications as prescribed Attend all scheduled provider appointments Call pharmacy for medication refills Call provider office for new concerns or questions Monitor and write down on paper: weights at home at least 4 times per week; blood pressures at home at least once per week Continue to stay active and consider weather conditions to modify exercise as needed Take measures to prevent falls Continue to follow heart-healthy, low sodium diet Continue to stay active and get regular structured exercise- great job at joining the [employer] and using your [place] benefit      Plan: Telephone follow up appointment with care management team member scheduled for:  Monday April 08, 2021 at 9:00 am The patient has been provided with contact information for the care management team and has been advised to call with any health related questions or concerns  [name], RN, BSN, [place] [phone]: direct office [phone]: mobile

## 2021-01-23 DIAGNOSIS — I5042 Chronic combined systolic (congestive) and diastolic (congestive) heart failure: Secondary | ICD-10-CM | POA: Diagnosis not present

## 2021-01-23 DIAGNOSIS — J449 Chronic obstructive pulmonary disease, unspecified: Secondary | ICD-10-CM | POA: Diagnosis not present

## 2021-01-28 ENCOUNTER — Other Ambulatory Visit: Payer: Self-pay

## 2021-01-28 MED ORDER — SPIRONOLACTONE 25 MG PO TABS: 12.5000 mg | ORAL_TABLET | Freq: Every day | ORAL | 1 refills | Status: DC

## 2021-02-07 ENCOUNTER — Telehealth: Payer: Self-pay | Admitting: Internal Medicine

## 2021-02-07 DIAGNOSIS — I110 Hypertensive heart disease with heart failure: Secondary | ICD-10-CM | POA: Diagnosis not present

## 2021-02-07 DIAGNOSIS — I739 Peripheral vascular disease, unspecified: Secondary | ICD-10-CM | POA: Diagnosis not present

## 2021-02-07 DIAGNOSIS — I482 Chronic atrial fibrillation, unspecified: Secondary | ICD-10-CM | POA: Diagnosis not present

## 2021-02-07 DIAGNOSIS — I509 Heart failure, unspecified: Secondary | ICD-10-CM | POA: Diagnosis not present

## 2021-02-07 DIAGNOSIS — D692 Other nonthrombocytopenic purpura: Secondary | ICD-10-CM | POA: Diagnosis not present

## 2021-02-07 DIAGNOSIS — E261 Secondary hyperaldosteronism: Secondary | ICD-10-CM | POA: Diagnosis not present

## 2021-02-07 DIAGNOSIS — Z66 Do not resuscitate: Secondary | ICD-10-CM | POA: Diagnosis not present

## 2021-02-07 DIAGNOSIS — I25708 Atherosclerosis of coronary artery bypass graft(s), unspecified, with other forms of angina pectoris: Secondary | ICD-10-CM | POA: Diagnosis not present

## 2021-02-07 DIAGNOSIS — D6869 Other thrombophilia: Secondary | ICD-10-CM | POA: Diagnosis not present

## 2021-02-07 DIAGNOSIS — Z7901 Long term (current) use of anticoagulants: Secondary | ICD-10-CM | POA: Diagnosis not present

## 2021-02-07 DIAGNOSIS — F1120 Opioid dependence, uncomplicated: Secondary | ICD-10-CM | POA: Diagnosis not present

## 2021-02-07 DIAGNOSIS — J449 Chronic obstructive pulmonary disease, unspecified: Secondary | ICD-10-CM | POA: Diagnosis not present

## 2021-02-07 DIAGNOSIS — Z7951 Long term (current) use of inhaled steroids: Secondary | ICD-10-CM | POA: Diagnosis not present

## 2021-02-07 DIAGNOSIS — Z951 Presence of aortocoronary bypass graft: Secondary | ICD-10-CM | POA: Diagnosis not present

## 2021-02-07 MED ORDER — BUDESONIDE-FORMOTEROL FUMARATE 160-4.5 MCG/ACT IN AERO: INHALATION_SPRAY | RESPIRATORY_TRACT | 4 refills | Status: DC

## 2021-02-07 NOTE — Telephone Encounter (Signed)
1.Medication Requested: budesonide-formoterol (SYMBICORT) 160-4.5 MCG/ACT inhaler  2. Pharmacy (Name, Street, [place]): [id number] - [place], [place]  3. On Med List: yes  4. Last Visit with PCP: 01-11-2021  5. Next visit date with PCP: n/a   Agent: Please be advised that RX refills may take up to 3 business days. We ask that you follow-up with your pharmacy.

## 2021-02-07 NOTE — Telephone Encounter (Signed)
Left message for patient to call me back at [phone] to schedule Medicare Annual Wellness Visit   Last AWV  01/06/20  Please schedule at anytime with LB Griffith if patient calls the office back.    40 Minutes appointment   Any questions, please call me at [phone]

## 2021-02-14 ENCOUNTER — Other Ambulatory Visit: Payer: Self-pay

## 2021-02-14 ENCOUNTER — Ambulatory Visit (INDEPENDENT_AMBULATORY_CARE_PROVIDER_SITE_OTHER): Payer: PPO

## 2021-02-14 VITALS — BP 118/70 | HR 73 | Temp 98.1°F | Ht 66.0 in | Wt 213.0 lb

## 2021-02-14 DIAGNOSIS — Z0000 Encounter for general adult medical examination without abnormal findings: Secondary | ICD-10-CM | POA: Diagnosis not present

## 2021-02-14 NOTE — Progress Notes (Signed)
Subjective:   Scott [name]. is a 65 y.o. male who presents for Medicare Annual/Subsequent preventive examination.  Review of Systems     Cardiac Risk Factors include: advanced age (>45men, >60 women);dyslipidemia;family history of premature cardiovascular disease;hypertension;male gender;obesity (BMI >30kg/m2)     Objective:    Today's Vitals   02/14/21 0844 02/14/21 0904  BP: 118/70   Pulse: 73   Temp: 98.1 F (36.7 C)   SpO2: 95%   Weight: 213 lb (96.6 kg)   Height: 5\' 6"  (1.676 m)   PainSc: 0-No pain 0-No pain   Body mass index is 34.38 kg/m.  Advanced Directives 02/14/2021 09/28/2020 01/06/2020 04/18/2019 03/31/2019 11/14/2018 10/18/2018  Does Patient Have a Medical Advance Directive? Yes Yes Yes Yes Yes Yes Yes  Type of Advance Directive - - Public librarian;Living will [place];Living will Living will [place];Living will  Does patient want to make changes to medical advance directive? No - Patient declined No - Patient declined No - Patient declined - No - Patient declined No - Patient declined -  Copy of [place] in Chart? - - - No - copy requested No - copy requested - No - copy requested  Would patient like information on creating a medical advance directive? - - - - - - -  Pre-existing out of facility DNR order (yellow form or pink MOST form) - - - - - - -    Current Medications (verified) Outpatient Encounter Medications as of 02/14/2021  Medication Sig   acetaminophen (TYLENOL) 500 MG tablet Take 1,000 mg by mouth every 6 (six) hours as needed for mild pain.    ALPRAZolam (XANAX) 0.5 MG tablet TAKE 1 TABLET BY MOUTH TWICE A DAY AS NEEDED FOR ANXIETY   apixaban (ELIQUIS) 5 MG TABS tablet TAKE 1 TABLET BY MOUTH TWICE A DAY   budesonide-formoterol (SYMBICORT) 160-4.5 MCG/ACT inhaler TAKE 2 PUFFS BY MOUTH TWICE A DAY   cholecalciferol (VITAMIN D) 1000 units tablet Take 1,000 Units by mouth  daily.   cyclobenzaprine (FLEXERIL) 5 MG tablet Take 1 tablet (5 mg total) by mouth 3 (three) times daily as needed for muscle spasms.   docusate sodium (COLACE) 100 MG capsule Take 200 mg by mouth daily as needed for mild constipation.    furosemide (LASIX) 20 MG tablet TAKE 1 TABLET BY MOUTH EVERY DAY AS NEEDED   gabapentin (NEURONTIN) 100 MG capsule TAKE 1 CAPSULE BY MOUTH THREE TIMES A DAY   lovastatin (MEVACOR) 40 MG tablet TAKE 2 TABLETS (80 MG TOTAL) BY MOUTH EVERY EVENING.   metoprolol succinate (TOPROL-XL) 50 MG 24 hr tablet TAKE 1 AND 1/2 TABLETS DAILY BY MOUTH   Multiple Vitamin (MULTIVITAMIN WITH MINERALS) TABS tablet Take 1 tablet by mouth daily.    Olmesartan-amLODIPine-HCTZ 40-5-12.5 MG TABS TAKE 1 TABLET BY MOUTH EVERY DAY   PROAIR HFA 108 (90 Base) MCG/ACT inhaler TAKE 2 PUFFS BY MOUTH EVERY 6 HOURS AS NEEDED FOR WHEEZE OR SHORTNESS OF BREATH   spironolactone (ALDACTONE) 25 MG tablet Take 0.5 tablets (12.5 mg total) by mouth at bedtime.   temazepam (RESTORIL) 30 MG capsule Take 1 capsule (30 mg total) by mouth at bedtime as needed. for sleep   traMADol (ULTRAM) 50 MG tablet Take 1 tablet (50 mg total) by mouth every 6 (six) hours as needed.   No facility-administered encounter medications on file as of 02/14/2021.    Allergies (verified) Zolpidem tartrate  History: Past Medical History:  Diagnosis Date   ALLERGIC RHINITIS 01/14/2007   Allergy    ANXIETY 09/28/2006   Asbestos exposure    Atrial fibrillation ([place]) 09/28/2006   s/p afib ablation x 2   CHF (congestive heart failure) (H[facility])    CKD (chronic kidney disease), stage III ([place])    patient denies   COPD (chronic obstructive pulmonary disease) ([place])    Coronary artery disease 8/46/9629   Diastolic dysfunction 06/26/8411   Dyspnea    W/ EXERTION    Dysrhythmia    AFIB      First degree AV block 10/12/2018   Noted on EKG   Gallstones    Hyperglycemia    HYPERLIPIDEMIA 09/28/2006   HYPERTENSION 09/28/2006    HYPOTHYROIDISM, ACQUIRED NEC 09/28/2006   pt denies and doesn't know   Long term current use of anticoagulant 04/08/2010   Obese    OBSTRUCTIVE SLEEP APNEA 01/06/2008   NOT USING     Right knee DJD 07/09/2010   S/P CABG x 1 11/26/2011   Right internal mammary artery to right coronary artery   S/P Maze operation for atrial fibrillation 11/26/2011   complete biatrial lesion set with clipping of LA appendage   Sleep apnea    Past Surgical History:  Procedure Laterality Date   ANTERIOR CERVICAL DECOMP/DISCECTOMY FUSION N/A 06/20/2016   Procedure: Anterior Cervical Discectomy Fusion - Cervical five-Cervical six - Cervical six-Cervical seven - Cervicla seven- Thoracic one;  Surgeon: Earnie Larsson, MD;  Location: [place];  Service: Neurosurgery;  Laterality: N/A;   CARDIOVERSION     X4    CORONARY ARTERY BYPASS GRAFT  11/26/2011   Procedure: CORONARY ARTERY BYPASS GRAFTING (CABG);  Surgeon: Rexene Alberts, MD;  Location: [place];  Service: Open Heart Surgery;  Laterality: N/A;  coronary artery bypass on pump times one utilizing the right internal mammary artery, transesophageal echocardiogram    HAND SURGERY Left    LEFT HEART CATHETERIZATION WITH CORONARY ANGIOGRAM N/A 11/24/2011   Procedure: LEFT HEART CATHETERIZATION WITH CORONARY ANGIOGRAM;  Surgeon: Peter M Martinique, MD;  Location: [facility] CATH LAB;  Service: Cardiovascular;  Laterality: N/A;   LOOP RECORDER IMPLANT N/A 07/06/2012   Procedure: LOOP RECORDER IMPLANT;  Surgeon: Thompson Grayer, MD;  Location: [facility] CATH LAB;  Service: Cardiovascular;  Laterality: N/A;   MAZE  11/26/2011   Procedure: MAZE;  Surgeon: Rexene Alberts, MD;  Location: [place];  Service: Open Heart Surgery;  Laterality: N/A;  [place]omaze    Radiofrequency Ablation for atrial fibrillation  12/30/07 and 06/05/09   afib ablation x 2 by JA   Radiofrequency ablation for atrial flutter  2005   CTI   TOTAL KNEE ARTHROPLASTY Right 04/05/2019   Procedure: RIGHT TOTAL KNEE ARTHROPLASTY;  Surgeon: Melrose Nakayama, MD;  Location: [facility];  Service: Orthopedics;  Laterality: Right;   Family History  Problem Relation Age of Onset   Dementia Father    Hypertension Father    Atrial fibrillation Mother    Stroke Mother    Dementia Paternal Grandfather    Cancer Paternal Grandmother        type unknown   Colon cancer Neg Hx    Esophageal cancer Neg Hx    Prostate cancer Neg Hx    Rectal cancer Neg Hx    Stomach cancer Neg Hx    Social History   Socioeconomic History   Marital status: Single    Spouse name: Not on file   Number of children:  0   Years of education: Not on file   Highest education level: Not on file  Occupational History   Occupation: works at the [employer] part-time  Tobacco Use   Smoking status: Former    Packs/day: 1.50    Years: 26.00    Pack years: 39.00    Types: Cigarettes    Quit date: 04/06/1995    Years since quitting: 25.8   Smokeless tobacco: Never  Vaping Use   Vaping Use: Never used  Substance and Sexual Activity   Alcohol use: No    Alcohol/week: 0.0 standard drinks   Drug use: No   Sexual activity: Yes  Other Topics Concern   Not on file  Social History Narrative   Pt lives in [place], [place] with his friend.    Social Determinants of Health   Financial Resource Strain: Low Risk    Difficulty of Paying Living Expenses: Not hard at all  Food Insecurity: No Food Insecurity   Worried About Charity fundraiser in the Last Year: Never true   [place] in the Last Year: Never true  Transportation Needs: No Transportation Needs   Lack of Transportation (Medical): No   Lack of Transportation (Non-Medical): No  Physical Activity: Sufficiently Active   Days of Exercise per Week: 7 days   Minutes of Exercise per Session: 30 min  Stress: No Stress Concern Present   Feeling of Stress : Not at all  Social Connections: Moderately Integrated   Frequency of Communication with Friends and Family: More than three times a week   Frequency of Social  Gatherings with Friends and Family: More than three times a week   Attends Religious Services: 1 to 4 times per year   Active Member of [employer] or Organizations: Yes   Attends Archivist Meetings: 1 to 4 times per year   Marital Status: Never married    Tobacco Counseling Counseling given: Not Answered   Clinical Intake:  Pre-visit preparation completed: Yes  Pain : No/denies pain Pain Score: 0-No pain     BMI - recorded: 34.38 Nutritional Status: BMI > 30  Obese Nutritional Risks: None Diabetes: No  How often do you need to have someone help you when you read instructions, pamphlets, or other written materials from your doctor or pharmacy?: 1 - Never What is the last grade level you completed in school?: 4 years of college  Diabetic? no  Interpreter Needed?: No  Information entered by :: Lisette Abu, LPN   Activities of Daily Living In your present state of health, do you have any difficulty performing the following activities: 02/14/2021  Hearing? Y  Vision? N  Difficulty concentrating or making decisions? N  Walking or climbing stairs? N  Dressing or bathing? N  Doing errands, shopping? N  Preparing Food and eating ? N  Using the Toilet? N  In the past six months, have you accidently leaked urine? N  Do you have problems with loss of bowel control? N  Managing your Medications? N  Managing your Finances? N  Housekeeping or managing your Housekeeping? N  Some recent data might be hidden    Patient Care Team: [name], MD as PCP - General Sueanne Margarita, MD as PCP - Sleep Medicine (Cardiology) Trula Slade, DPM as Consulting Physician (Podiatry) Evans Lance, MD as Consulting Physician (Cardiology) Irene Shipper, MD as Consulting Physician (Gastroenterology) Knox Royalty, RN as Case Manager  Indicate any recent  Medical Services you may have received from other than [facility] providers in the past year (date may be  approximate).     Assessment:   This is a routine wellness examination for [place].  Hearing/Vision screen No results found.  Dietary issues and exercise activities discussed: Current Exercise Habits: Home exercise routine, Type of exercise: walking, Time (Minutes): 30, Frequency (Times/Week): 7, Weekly Exercise (Minutes/Week): 210, Intensity: Moderate   Goals Addressed               This Visit's Progress     Patient Stated (pt-stated)        Continue to lose weight and reach a weight goal of 150 pounds.      Depression Screen PHQ 2/9 Scores 02/14/2021 09/28/2020 07/13/2020 07/13/2020 01/06/2020 03/14/2019 10/18/2018  PHQ - 2 Score 0 0 0 0 0 0 1    Fall Risk Fall Risk  02/14/2021 12/21/2020 09/28/2020 07/13/2020 07/13/2020  Falls in the past year? 1 1 1  0 1  Number falls in past yr: 1 1 1  0 1  Comment - - - - -  Injury with Fall? 0 0 0 0 0  Risk Factor Category  - - - - -  Comment - - - - -  Risk for fall due to : History of fall(s) History of fall(s) Orthopedic patient;History of fall(s);Medication side effect - -  Risk for fall due to: Comment - - - - -  Follow up Falls prevention discussed Falls prevention discussed Falls prevention discussed - -    FALL RISK PREVENTION PERTAINING TO THE HOME:  Any stairs in or around the home? No  If so, are there any without handrails? No  Home free of loose throw rugs in walkways, pet beds, electrical cords, etc? Yes  Adequate lighting in your home to reduce risk of falls? Yes   ASSISTIVE DEVICES UTILIZED TO PREVENT FALLS:  Life alert? No  Use of a cane, walker or w/c? No  Grab bars in the bathroom? No  Shower chair or bench in shower? No  Elevated toilet seat or a handicapped toilet? No   TIMED UP AND GO:  Was the test performed? Yes .  Length of time to ambulate 10 feet: 6 sec.   Gait steady and fast without use of assistive device  Cognitive Function: Normal cognitive status assessed by direct observation by this Nurse  Health Advisor. No abnormalities found.   MMSE - Mini Mental State Exam 10/09/2017 03/21/2016  Not completed: Refused -  Orientation to time - 4  Orientation to Place - 5  Registration - 3  Attention/ Calculation - 5  Recall - 2  Language- name 2 objects - 2  Language- repeat - 1  Language- follow 3 step command - 3  Language- read & follow direction - 1  Write a sentence - 1  Copy design - 1  Total score - 28        Immunizations Immunization History  Administered Date(s) Administered   Influenza Inj Mdck Quad Pf 11/20/2018   Influenza Split 11/25/2011   Influenza Whole 01/13/2005, 11/26/2007, 12/06/2008, 11/29/2009   Influenza, High Dose Seasonal PF 12/03/2020   Influenza,inj,Quad PF,6+ Mos 03/08/2015, 11/04/2019   Influenza-Unspecified 10/25/2012, 11/21/2015, 11/25/2015, 12/25/2016, 12/10/2019   PFIZER Comirnaty(Gray Top)Covid-19 Tri-Sucrose Vaccine 07/12/2020   PFIZER(Purple Top)SARS-COV-2 Vaccination 05/26/2019, 06/20/2019, 12/16/2019, 07/13/2020   PNEUMOCOCCAL CONJUGATE-20 06/08/2020   Pfizer Covid-19 Vaccine Bivalent Booster 35yrs & up 12/12/2020   Pneumococcal Conjugate-13 12/21/2015   Pneumococcal Polysaccharide-23 03/11/2011  Td 03/27/2004, 04/04/2014   Zoster, Live 01/04/2016    TDAP status: Up to date  Flu Vaccine status: Up to date  Pneumococcal vaccine status: Up to date  Covid-19 vaccine status: Completed vaccines  Qualifies for Shingles Vaccine? Yes   Zostavax completed Yes   Shingrix Completed?: No.    Education has been provided regarding the importance of this vaccine. Patient has been advised to call insurance company to determine out of pocket expense if they have not yet received this vaccine. Advised may also receive vaccine at local pharmacy or Health Dept. Verbalized acceptance and understanding.  Screening Tests Health Maintenance  Topic Date Due   Zoster Vaccines- Shingrix (1 of 2) Never done   TETANUS/TDAP  04/04/2024   COLONOSCOPY  (Pts 45-43yrs Insurance coverage will need to be confirmed)  05/03/2025   Pneumonia Vaccine 65+ Years old  Completed   INFLUENZA VACCINE  Completed   COVID-19 Vaccine  Completed   Hepatitis C Screening  Completed   HIV Screening  Completed   HPV VACCINES  Aged Out    Health Maintenance  Health Maintenance Due  Topic Date Due   Zoster Vaccines- Shingrix (1 of 2) Never done    Colorectal cancer screening: Type of screening: Colonoscopy. Completed 05/03/2020. Repeat every 5 years  Lung Cancer Screening: (Low Dose CT Chest recommended if Age 30-80 years, 30 pack-year currently smoking OR have quit w/in 15years.) does not qualify.   Lung Cancer Screening Referral: no  Additional Screening:  Hepatitis C Screening: does qualify; Completed yes  Vision Screening: Recommended annual ophthalmology exams for early detection of glaucoma and other disorders of the eye. Is the patient up to date with their annual eye exam?  Yes  Who is the provider or what is the name of the office in which the patient attends annual eye exams? [facility] If pt is not established with a provider, would they like to be referred to a provider to establish care? No .   Dental Screening: Recommended annual dental exams for proper oral hygiene  Community Resource Referral / Chronic Care Management: CRR required this visit?  No   CCM required this visit?  No      Plan:     I have personally reviewed and noted the following in the patients chart:   Medical and social history Use of alcohol, tobacco or illicit drugs  Current medications and supplements including opioid prescriptions. Patient is not currently taking opioid prescriptions. Functional ability and status Nutritional status Physical activity Advanced directives List of other physicians Hospitalizations, surgeries, and ER visits in previous 12 months Vitals Screenings to include cognitive, depression, and falls Referrals and  appointments  In addition, I have reviewed and discussed with patient certain preventive protocols, quality metrics, and best practice recommendations. A written personalized care plan for preventive services as well as general preventive health recommendations were provided to patient.     Sheral Flow, LPN   62/26/3335   Nurse Notes:  Hearing Screening - Comments:: Patient has issue with decreased hearing. No hearing aids. Vision Screening - Comments:: Patient wears corrective glasses/contacts.  Eye exam done annually.

## 2021-02-14 NOTE — Patient Instructions (Addendum)
[name] , Thank you for taking time to come for your Medicare Wellness Visit. I appreciate your ongoing commitment to your health goals. Please review the following plan we discussed and let me know if I can assist you in the future.   Screening recommendations/referrals: Colonoscopy: 05/03/2020; due every 5 years Recommended yearly ophthalmology/optometry visit for glaucoma screening and checkup Recommended yearly dental visit for hygiene and checkup  Vaccinations: Influenza vaccine: 12/03/2020 Pneumococcal vaccine: 03/11/2011, 12/21/2015 Tdap vaccine: 04/04/2014; due every 10 years Shingles vaccine: Check your local pharmacy after 02/24/2021 for shingrix vaccine; insurance will cover. Covid-19: 05/26/2019, 06/20/2019, 12/16/2019, 07/12/2020, 12/12/2020  Advanced directives: Yes; documents on file  Conditions/risks identified: Yes; Client understands the importance of follow-up with providers by attending scheduled visits and discussed goals to eat healthier, increase physical activity, exercise the brain, socialize more, get enough sleep and make time for laughter.  Next appointment: Please schedule your next Medicare Wellness Visit with your Nurse Health Advisor in 1 year by calling [phone].  Blood pressure today was 118/70; it should be 140/90 and below.  Preventive Care 65 Years and Older, Male Preventive care refers to lifestyle choices and visits with your health care provider that can promote health and wellness. What does preventive care include? A yearly physical exam. This is also called an annual well check. Dental exams once or twice a year. Routine eye exams. Ask your health care provider how often you should have your eyes checked. Personal lifestyle choices, including: Daily care of your teeth and gums. Regular physical activity. Eating a healthy diet. Avoiding tobacco and drug use. Limiting alcohol use. Practicing safe sex. Taking low doses of aspirin every  day. Taking vitamin and mineral supplements as recommended by your health care provider. What happens during an annual well check? The services and screenings done by your health care provider during your annual well check will depend on your age, overall health, lifestyle risk factors, and family history of disease. Counseling  Your health care provider may ask you questions about your: Alcohol use. Tobacco use. Drug use. Emotional well-being. Home and relationship well-being. Sexual activity. Eating habits. History of falls. Memory and ability to understand (cognition). Work and work Statistician. Screening  You may have the following tests or measurements: Height, weight, and BMI. Blood pressure. Lipid and cholesterol levels. These may be checked every 5 years, or more frequently if you are over 65 years old. Skin check. Lung cancer screening. You may have this screening every year starting at age 65 if you have a 30-pack-year history of smoking and currently smoke or have quit within the past 15 years. Fecal occult blood test (FOBT) of the stool. You may have this test every year starting at age 65. Flexible sigmoidoscopy or colonoscopy. You may have a sigmoidoscopy every 5 years or a colonoscopy every 10 years starting at age 65. Prostate cancer screening. Recommendations will vary depending on your family history and other risks. Hepatitis C blood test. Hepatitis B blood test. Sexually transmitted disease (STD) testing. Diabetes screening. This is done by checking your blood sugar (glucose) after you have not eaten for a while (fasting). You may have this done every 1-3 years. Abdominal aortic aneurysm (AAA) screening. You may need this if you are a current or former smoker. Osteoporosis. You may be screened starting at age 11 if you are at high risk. Talk with your health care provider about your test results, treatment options, and if necessary, the need for more  tests. Vaccines  Your health care provider may recommend certain vaccines, such as: Influenza vaccine. This is recommended every year. Tetanus, diphtheria, and acellular pertussis (Tdap, Td) vaccine. You may need a Td booster every 10 years. Zoster vaccine. You may need this after age 65. Pneumococcal 13-valent conjugate (PCV13) vaccine. One dose is recommended after age 65. Pneumococcal polysaccharide (PPSV23) vaccine. One dose is recommended after age 65. Talk to your health care provider about which screenings and vaccines you need and how often you need them. This information is not intended to replace advice given to you by your health care provider. Make sure you discuss any questions you have with your health care provider. Document Released: 03/09/2015 Document Revised: 10/31/2015 Document Reviewed: 12/12/2014 Elsevier Interactive Patient Education  2017 [place] Prevention in the Home Falls can cause injuries. They can happen to people of all ages. There are many things you can do to make your home safe and to help prevent falls. What can I do on the outside of my home? Regularly fix the edges of walkways and driveways and fix any cracks. Remove anything that might make you trip as you walk through a door, such as a raised step or threshold. Trim any bushes or trees on the path to your home. Use bright outdoor lighting. Clear any walking paths of anything that might make someone trip, such as rocks or tools. Regularly check to see if handrails are loose or broken. Make sure that both sides of any steps have handrails. Any raised decks and porches should have guardrails on the edges. Have any leaves, snow, or ice cleared regularly. Use sand or salt on walking paths during winter. Clean up any spills in your garage right away. This includes oil or grease spills. What can I do in the bathroom? Use night lights. Install grab bars by the toilet and in the tub and shower.  Do not use towel bars as grab bars. Use non-skid mats or decals in the tub or shower. If you need to sit down in the shower, use a plastic, non-slip stool. Keep the floor dry. Clean up any water that spills on the floor as soon as it happens. Remove soap buildup in the tub or shower regularly. Attach bath mats securely with double-sided non-slip rug tape. Do not have throw rugs and other things on the floor that can make you trip. What can I do in the bedroom? Use night lights. Make sure that you have a light by your bed that is easy to reach. Do not use any sheets or blankets that are too big for your bed. They should not hang down onto the floor. Have a firm chair that has side arms. You can use this for support while you get dressed. Do not have throw rugs and other things on the floor that can make you trip. What can I do in the kitchen? Clean up any spills right away. Avoid walking on wet floors. Keep items that you use a lot in easy-to-reach places. If you need to reach something above you, use a strong step stool that has a grab bar. Keep electrical cords out of the way. Do not use floor polish or wax that makes floors slippery. If you must use wax, use non-skid floor wax. Do not have throw rugs and other things on the floor that can make you trip. What can I do with my stairs? Do not leave any items on the stairs. Make sure that there are handrails  on both sides of the stairs and use them. Fix handrails that are broken or loose. Make sure that handrails are as long as the stairways. Check any carpeting to make sure that it is firmly attached to the stairs. Fix any carpet that is loose or worn. Avoid having throw rugs at the top or bottom of the stairs. If you do have throw rugs, attach them to the floor with carpet tape. Make sure that you have a light switch at the top of the stairs and the bottom of the stairs. If you do not have them, ask someone to add them for you. What else  can I do to help prevent falls? Wear shoes that: Do not have high heels. Have rubber bottoms. Are comfortable and fit you well. Are closed at the toe. Do not wear sandals. If you use a stepladder: Make sure that it is fully opened. Do not climb a closed stepladder. Make sure that both sides of the stepladder are locked into place. Ask someone to hold it for you, if possible. Clearly mark and make sure that you can see: Any grab bars or handrails. First and last steps. Where the edge of each step is. Use tools that help you move around (mobility aids) if they are needed. These include: Canes. Walkers. Scooters. Crutches. Turn on the lights when you go into a dark area. Replace any light bulbs as soon as they burn out. Set up your furniture so you have a clear path. Avoid moving your furniture around. If any of your floors are uneven, fix them. If there are any pets around you, be aware of where they are. Review your medicines with your doctor. Some medicines can make you feel dizzy. This can increase your chance of falling. Ask your doctor what other things that you can do to help prevent falls. This information is not intended to replace advice given to you by your health care provider. Make sure you discuss any questions you have with your health care provider. Document Released: 12/07/2008 Document Revised: 07/19/2015 Document Reviewed: 03/17/2014 Elsevier Interactive Patient Education  2017 [employer].

## 2021-02-27 ENCOUNTER — Other Ambulatory Visit: Payer: Self-pay | Admitting: Internal Medicine

## 2021-03-21 ENCOUNTER — Ambulatory Visit: Payer: PPO | Admitting: Cardiology

## 2021-03-29 ENCOUNTER — Ambulatory Visit: Payer: PPO | Admitting: Cardiology

## 2021-04-08 ENCOUNTER — Other Ambulatory Visit: Payer: Self-pay

## 2021-04-08 ENCOUNTER — Ambulatory Visit (INDEPENDENT_AMBULATORY_CARE_PROVIDER_SITE_OTHER): Payer: PPO | Admitting: *Deleted

## 2021-04-08 DIAGNOSIS — J449 Chronic obstructive pulmonary disease, unspecified: Secondary | ICD-10-CM

## 2021-04-08 DIAGNOSIS — I5042 Chronic combined systolic (congestive) and diastolic (congestive) heart failure: Secondary | ICD-10-CM

## 2021-04-08 MED ORDER — ALBUTEROL SULFATE HFA 108 (90 BASE) MCG/ACT IN AERS: INHALATION_SPRAY | RESPIRATORY_TRACT | 5 refills | Status: DC

## 2021-04-08 NOTE — Progress Notes (Signed)
[facility] for routine refill  - to taylor please

## 2021-04-08 NOTE — Chronic Care Management (AMB) (Signed)
Chronic Care Management   CCM RN Visit Note  04/08/2021 Name: Scott [name]. MRN: [id number] DOB: 25-Dec-1955  Subjective: Scott Lobo [name]. is a 66 y.o. year old male who is a primary care patient of [name], MD. The care management team was consulted for assistance with disease management and care coordination needs.    Engaged with patient by telephone for follow up visit in response to provider referral for case management and/or care coordination services.   Consent to Services:  The patient was given information about Chronic Care Management services, agreed to services, and gave verbal consent prior to initiation of services.  Please see initial visit note for detailed documentation.  Patient agreed to services and verbal consent obtained.   Assessment: Review of patient past medical history, allergies, medications, health status, including review of consultants reports, laboratory and other test data, was performed as part of comprehensive evaluation and provision of chronic care management services.   SDOH (Social Determinants of Health) assessments and interventions performed:  SDOH Interventions    Flowsheet Row Most Recent Value  SDOH Interventions   Food Insecurity Interventions Intervention Not Indicated  Housing Interventions Intervention Not Indicated  Physical Activity Interventions Intervention Not Indicated, [employer]  [Attends [employer]/ [employer],  and walks dog every day]  Transportation Interventions Intervention Not Indicated  [patient continues to drive self]      CCM Care Plan  Allergies  Allergen Reactions   Zolpidem Tartrate Other (See Comments)    Extremely drowsy the next day   Outpatient Encounter Medications as of 04/08/2021  Medication Sig Note   acetaminophen (TYLENOL) 500 MG tablet Take 1,000 mg by mouth every 6 (six) hours as needed for mild pain.     ALPRAZolam (XANAX) 0.5 MG tablet TAKE 1 TABLET BY MOUTH TWICE A DAY AS NEEDED  FOR ANXIETY    apixaban (ELIQUIS) 5 MG TABS tablet TAKE 1 TABLET BY MOUTH TWICE A DAY    budesonide-formoterol (SYMBICORT) 160-4.5 MCG/ACT inhaler TAKE 2 PUFFS BY MOUTH TWICE A DAY    cholecalciferol (VITAMIN D) 1000 units tablet Take 1,000 Units by mouth daily.    cyclobenzaprine (FLEXERIL) 5 MG tablet Take 1 tablet (5 mg total) by mouth 3 (three) times daily as needed for muscle spasms.    docusate sodium (COLACE) 100 MG capsule Take 200 mg by mouth daily as needed for mild constipation.     furosemide (LASIX) 20 MG tablet TAKE 1 TABLET BY MOUTH EVERY DAY AS NEEDED 09/28/2020: 09/28/20: Patient reports taking QD   gabapentin (NEURONTIN) 100 MG capsule TAKE 1 CAPSULE BY MOUTH THREE TIMES A DAY    lovastatin (MEVACOR) 40 MG tablet TAKE 2 TABLETS (80 MG TOTAL) BY MOUTH EVERY EVENING.    metoprolol succinate (TOPROL-XL) 50 MG 24 hr tablet TAKE 1 AND 1/2 TABLETS DAILY BY MOUTH    Multiple Vitamin (MULTIVITAMIN WITH MINERALS) TABS tablet Take 1 tablet by mouth daily.     Olmesartan-amLODIPine-HCTZ 40-5-12.5 MG TABS TAKE 1 TABLET BY MOUTH EVERY DAY    PROAIR HFA 108 (90 Base) MCG/ACT inhaler TAKE 2 PUFFS BY MOUTH EVERY 6 HOURS AS NEEDED FOR WHEEZE OR SHORTNESS OF BREATH    spironolactone (ALDACTONE) 25 MG tablet Take 0.5 tablets (12.5 mg total) by mouth at bedtime.    temazepam (RESTORIL) 30 MG capsule Take 1 capsule (30 mg total) by mouth at bedtime as needed. for sleep    traMADol (ULTRAM) 50 MG tablet Take 1 tablet (50  mg total) by mouth every 6 (six) hours as needed.    No facility-administered encounter medications on file as of 04/08/2021.   Patient Active Problem List   Diagnosis Date Noted   Low back pain 01/01/2021   Lower GI bleeding 01/01/2021   Pain due to total right knee replacement ([place]) 01/09/2020   Primary osteoarthritis of right knee 04/05/2019   CKD (chronic kidney disease) stage 3, GFR 30-59 ml/min ([facility]) 03/14/2019   Degenerative joint disease of knee, right 09/27/2018   Dyspnea  09/06/2018   Right knee pain 09/06/2018   Radiculitis of left cervical region 03/05/2018   Plantar fasciitis, left 08/19/2017   Chest pain 02/27/2017   Right ear pain 02/27/2017   Left groin pain 09/13/2016   Rib pain on left side 09/13/2016   Left leg pain 09/13/2016   Stenosis of cervical spine with myelopathy ([place]) 06/20/2016   Special screening for malignant neoplasms, colon 04/04/2016   Cough 01/11/2016   COPD exacerbation ([place]) 12/22/2015   Rectal bleeding 08/14/2014   Intercostal muscle strain 08/07/2014   Chronic meniscal tear of knee 04/06/2014   Heel bone fracture 04/06/2014   Hyperglycemia 04/04/2014   Recurrent falls 04/04/2014   Bilateral knee pain 04/04/2014   Intractable left heel pain 04/04/2014   Insomnia 04/04/2014   Hematochezia 04/04/2014   Bladder neck obstruction 12/19/2013   Encounter for therapeutic drug monitoring 03/30/2013   Palpitations 07/06/2012   Shortness of breath 01/19/2012   Sick sinus syndrome ([place]) 12/01/2011   S/P CABG x 1 11/26/2011   S/P Maze operation for atrial fibrillation 11/26/2011   CAD (coronary artery disease) 11/25/2011   Paroxysmal atrial fibrillation ([place]) 11/24/2011   Chronic combined systolic (congestive) and diastolic (congestive) heart failure ([place]) 11/24/2011   Gross hematuria 12/12/2010   Left knee pain 12/12/2010   Left knee DJD 07/09/2010   Preventative health care 07/07/2010   Chronic anticoagulation 04/08/2010   COPD (chronic obstructive pulmonary disease) ([place]) 10/04/2008   OBSTRUCTIVE SLEEP APNEA 01/06/2008   ALLERGIC RHINITIS 01/14/2007   Hypothyroidism 09/28/2006   Hyperlipidemia 09/28/2006   Anxiety state 09/28/2006   Essential hypertension 09/28/2006   Conditions to be addressed/monitored:  CHF and COPD  Care Plan : General Nursing  (Adult)  Updates made by Knox Royalty, RN since 04/08/2021 12:00 AM     Problem: Chronic Disease Management Needs   Priority: Medium     Long-Range Goal:  Development of plan of care for long term chronic disease management   Start Date: 09/28/2020  Expected End Date: 09/28/2021  Priority: Medium  Note:   Current Barriers:  Chronic Disease Management support and education needs related to CHF and COPD  RNCM Clinical Goal(s):  Patient will demonstrate ongoing adherence to prescribed treatment plan for CHF and COPD as evidenced by adherence to prescribed medication regimen contacting provider for new or worsened symptoms or questions following action plan for COPD/ CHF, including monitoring/ recording weekly weights and blood pressures at home; following heart healthy, low salt diet; ongoing activity/ exercise  through collaboration with RN Care manager, provider, and care team.   Interventions: 1:1 collaboration with primary care provider regarding development and update of comprehensive plan of care as evidenced by provider attestation and co-signature Inter-disciplinary care team collaboration (see longitudinal plan of care) Evaluation of current treatment plan related to  self management and patient's adherence to plan as established by provider; SDOH updated: no new/ unmet concerns identified Pain Assessment Updated: denies acute/ chronic pain Falls assessment updated:  denies new/ recent falls since our last outreach 01/11/22; does not use assistive devices; previously provided education around fall risks/ prevention reinforced; positive reinforcement provided with encouragement to continue efforts Confirmed no more episodes rectal/ GI bleeding as reported in October 2022: reinforced action plan, need to contact PCP for any additional episodes, should they occur Reminded patient of need to schedule annual exam with PCP (due late May 2023) and encouraged him to consider scheduling- he will do Confirmed no medication changes: however, patient is requesting a refill on his rescue inhaler- (Pro-Air); he states that he has noticed that he is using  approximately 1-2 times per day around activity-- he has increased his activity and continues going to gym on T/ Th and walks his dog "about every day" for a mile or more-- tolerates activity, but has noticed periodic increased shortness of breath while active-- using maintenance inhaler as prescribed, is running out of Pro-Air: will make this request per patient request today  COPD: (Status: 04/08/21: Goal on Track (progressing): YES.) Long Term goal Advised patient to track and manage COPD triggers Provided instruction about proper use of medications used for management of COPD including inhalers Advised patient to self assesses COPD action plan zone and make appointment with provider if in the yellow zone for 48 hours without improvement Advised patient to engage in light exercise as tolerated 3-5 days a week to aid in the the management of COPD Discussed the importance of adequate rest and management of fatigue with COPD Confirmed patient breathing status at baseline: however, as noted above, he is running out of rescue inhaler due to slight increased use due to physical activity: PCP made aware/ refill requested on patient's behalf Reinforced difference between maintenance and rescue inhalers- patient requires ongoing reinforcement, and today verbalizes ongoing good general understanding of same; confirms he is using both as prescribed Confirmed patient continues to use CPAP q HS- denies issues/ concerns with CPAP Reports has continued attending [employer] for [employer] Benefit: has regular workout sessions on T/Th; positive reinforcement provided with encouragement to continue efforts Reinforced previously provided education around action plan for periods of shortness of breath: patient verbalizes ongoing good understanding of same, but will benefit from ongoing reinforcement/ support; reports recovers from periods of shortness of breath "quickly, within minutes" after using rescue inhaler/  resting Confirmed patient continues following "healthy diet;" importance of avoiding foods high in salt discussed with patient; education provided  Heart Failure Interventions:  (Status: 04/08/21: Goal on Track (progressing): YES.) Long Term Goal Discussed importance of daily weight and advised patient to weigh and record daily Reviewed role of diuretics in prevention of fluid overload and management of heart failure Discussed the importance of keeping all appointments with provider Provided patient with education about the role of exercise in the management of heart failure Reviewed with patient recent weights at home: he reports recent weight ranges between 180-184, with a weight "most generally" (and today) of "182 lbs;" this represents a weight loss from our last outreach on 01/11/22 when he reported general ranges between 190-193 lbs; positive reinforcement provided with encouragement to continue efforts Reinforced rationale/ purpose of regular weight monitoring and recording at home; weight gain guidelines in setting of CHF, corresponding action plan for weight gain Confirmed patient has not experienced recent weight gain > 3 lbs/ overnight/ 5 lbs one week Reviewed upcoming scheduled cardiology provider office visit: 06/17/21- cardiology provider- patient aware, has plans to attend as scheduled  Patient Goals/Self-Care Activities: As evidenced by review  of EHR, collaboration with care team, and patient reporting during CCM RN CM outreach,  Patient Scott [name] will:  Take medications as prescribed Attend all scheduled provider appointments Call pharmacy for medication refills Call provider office for new concerns or questions Monitor and write down on paper: weights at home at least 4 times per week; blood pressures at home at least once per week; you have made great progress in losing weight: the weight you reported today was 182 lbs Continue to stay active and consider weather conditions to modify  exercise as needed Keep up the great work to prevent falls Continue to follow heart-healthy, low sodium diet Continue to stay active and get regular structured exercise- great job at joining the [employer] and using your [employer] benefit    Plan: Telephone follow up appointment with care management team member scheduled for:  Monday, September 02, 2021 at 9:00 am The patient has been provided with contact information for the care management team and has been advised to call with any health related questions or concerns  [name], RN, BSN, [place] [phone]: direct office

## 2021-04-08 NOTE — Patient Instructions (Addendum)
Visit Information  Scott [name], "Scott [name]" thank you for taking time to talk with me today. Please don't hesitate to contact me if I can be of assistance to you before our next scheduled telephone appointment.  As you requested, I have asked Dr. [name] to refill the prescription for your rescue inhaler-- please listen out for a call from either Dr. Gwynn Burly office staff or from your outpatient pharmacy  It is time to schedule your annual exam with Dr. [name] (due in late May 2023)- please consider scheduling this visit  Below are the goals we discussed today:  Patient Self-Care Activities: Patient Scott [name] will:  Take medications as prescribed Attend all scheduled provider appointments Call pharmacy for medication refills Call provider office for new concerns or questions Monitor and write down on paper: weights at home at least 4 times per week; blood pressures at home at least once per week; you have made great progress in losing weight: the weight you reported today was 182 lbs Continue to stay active and consider weather conditions to modify exercise as needed Keep up the great work to prevent falls Continue to follow heart-healthy, low sodium diet Continue to stay active and get regular structured exercise- great job at joining the [employer] and using your [place] benefit  Our next scheduled telephone follow up visit/ appointment with care management team member is scheduled on:   Monday, July 10,2023 at 9:00 am- This is a PHONE [facility] appointment  If you need to cancel or re-schedule our visit, please call [phone] and our care guide team will be happy to assist you.   I look forward to hearing about your progress.   [name], RN, BSN, [place] [phone]: direct office  If you are experiencing a Mental Health or [place] or need someone to talk to, please  call the Suicide and Crisis Lifeline: 988 call  the [place] National Suicide Prevention Lifeline: 380-621-5295 or TTY: 862-399-3928 TTY 519-120-9224) to talk to a trained counselor call 1-800-273-TALK (toll free, 24 hour hotline) go to [facility] [address] [place] [phone]) call 911   The patient verbalized understanding of instructions, educational materials, and care plan provided today and agreed to receive a mailed copy of patient instructions, educational materials, and care plan  COPD and Physical Activity Chronic obstructive pulmonary disease (COPD) is a long-term, or chronic, condition that affects the lungs. COPD is a general term that can be used to describe many problems that cause inflammation of the lungs and limit airflow. These conditions include chronic bronchitis and emphysema. The main symptom of COPD is shortness of breath, which makes it harder to do even simple tasks. This can also make it harder to exercise and stay active. Talk with your health care provider about treatments to help you breathe better and actions you can take to prevent breathing problems during physical activity. What are the benefits of exercising when you have COPD? Exercising regularly is an important part of a healthy lifestyle. You can still exercise and do physical activities even though you have COPD. Exercise and physical activity improve your shortness of breath by increasing blood flow (circulation). This causes your heart to pump more oxygen through your body. Moderate exercise can: Improve oxygen use. Increase your energy level. Help with shortness of breath. Strengthen your breathing muscles. Improve heart health. Help with sleep. Improve your self-esteem and feelings of self-worth. Lower depression, stress, and  anxiety. Exercise can benefit everyone with COPD. The severity of your disease may affect how hard you can exercise, especially at first, but everyone can benefit. Talk with your  health care provider about how much exercise is safe for you, and which activities and exercises are safe for you. What actions can I take to prevent breathing problems during physical activity? Sign up for a pulmonary rehabilitation program. This type of program may include: Education about lung diseases. Exercise classes that teach you how to exercise and be more active while improving your breathing. This usually involves: Exercise using your lower extremities, such as a stationary bicycle. About 30 minutes of exercise, 2 to 5 times per week, for 6 to 12 weeks. Strength training, such as push-ups or leg lifts. Nutrition education. Group classes in which you can talk with others who also have COPD and learn ways to manage stress. If you use an oxygen tank, you should use it while you exercise. Work with your health care provider to adjust your oxygen for your physical activity. Your resting flow rate is different from your flow rate during physical activity. How to manage your breathing while exercising While you are exercising: Take slow breaths. Pace yourself, and do nottry to go too fast. Purse your lips while breathing out. Pursing your lips is similar to a kissing or whistling position. If doing exercise that uses a quick burst of effort, such as weight lifting: Breathe in before starting the exercise. Breathe out during the hardest part of the exercise, such as raising the weights. Where to find support You can find support for exercising with COPD from: Your health care provider. A pulmonary rehabilitation program. Your local health department or community health programs. Support groups, either online or in-person. Your health care provider may be able to recommend support groups. Where to find more information You can find more information about exercising with COPD from: American Lung Association: lung.org COPD Foundation: copdfoundation.org Contact a health care provider  if: Your symptoms get worse. You have nausea. You have a fever. You want to start a new exercise program or a new activity. Get help right away if: You have chest pain. You cannot breathe. These symptoms may represent a serious problem that is an emergency. Do not wait to see if the symptoms will go away. Get medical help right away. Call your local emergency services (911 in the [place] Do not drive yourself to the hospital. Summary COPD is a general term that can be used to describe many different lung problems that cause lung inflammation and limit airflow. This includes chronic bronchitis and emphysema. Exercise and physical activity improve your shortness of breath by increasing blood flow (circulation). This causes your heart to provide more oxygen to your body. Contact your health care provider before starting any exercise program or new activity. Ask your health care provider what exercises and activities are safe for you. This information is not intended to replace advice given to you by your health care provider. Make sure you discuss any questions you have with your health care provider. Document Revised: 12/20/2019 Document Reviewed: 12/20/2019 Elsevier Patient Education  2022 [employer].

## 2021-04-11 DIAGNOSIS — J449 Chronic obstructive pulmonary disease, unspecified: Secondary | ICD-10-CM | POA: Diagnosis not present

## 2021-04-11 DIAGNOSIS — I509 Heart failure, unspecified: Secondary | ICD-10-CM | POA: Diagnosis not present

## 2021-04-11 DIAGNOSIS — Z6831 Body mass index (BMI) 31.0-31.9, adult: Secondary | ICD-10-CM | POA: Diagnosis not present

## 2021-04-11 DIAGNOSIS — I482 Chronic atrial fibrillation, unspecified: Secondary | ICD-10-CM | POA: Diagnosis not present

## 2021-04-11 DIAGNOSIS — D6869 Other thrombophilia: Secondary | ICD-10-CM | POA: Diagnosis not present

## 2021-04-17 ENCOUNTER — Other Ambulatory Visit: Payer: Self-pay | Admitting: Internal Medicine

## 2021-04-17 DIAGNOSIS — I5032 Chronic diastolic (congestive) heart failure: Secondary | ICD-10-CM

## 2021-04-23 DIAGNOSIS — I5042 Chronic combined systolic (congestive) and diastolic (congestive) heart failure: Secondary | ICD-10-CM | POA: Diagnosis not present

## 2021-04-23 DIAGNOSIS — J449 Chronic obstructive pulmonary disease, unspecified: Secondary | ICD-10-CM

## 2021-05-09 DIAGNOSIS — G4733 Obstructive sleep apnea (adult) (pediatric): Secondary | ICD-10-CM | POA: Diagnosis not present

## 2021-05-18 ENCOUNTER — Other Ambulatory Visit: Payer: Self-pay | Admitting: Internal Medicine

## 2021-05-18 DIAGNOSIS — I5032 Chronic diastolic (congestive) heart failure: Secondary | ICD-10-CM

## 2021-06-04 ENCOUNTER — Telehealth: Payer: Self-pay | Admitting: Internal Medicine

## 2021-06-04 NOTE — Telephone Encounter (Signed)
Patient would like to have his Losartan -refilled - [facility] to [facility] on [address] ?

## 2021-06-04 NOTE — Telephone Encounter (Signed)
No, this medication is not on his med list ? ?Pt should be taking olmesartan-amlod-hct ?

## 2021-06-05 MED ORDER — OLMESARTAN-AMLODIPINE-HCTZ 40-5-12.5 MG PO TABS: 1.0000 | ORAL_TABLET | Freq: Every day | ORAL | 2 refills | Status: DC

## 2021-06-05 NOTE — Telephone Encounter (Signed)
Per patient, requested Olmesartan-Amlo-HCTZ. Prescription sent to [facility] pharamcy ?

## 2021-06-09 ENCOUNTER — Other Ambulatory Visit: Payer: Self-pay | Admitting: Internal Medicine

## 2021-06-09 NOTE — Telephone Encounter (Signed)
Please refill as per office routine med refill policy (all routine meds to be refilled for 3 mo or monthly (per pt preference) up to one year from last visit, then month to month grace period for 3 mo, then further med refills will have to be denied) ? ?

## 2021-06-10 ENCOUNTER — Telehealth: Payer: Self-pay | Admitting: Internal Medicine

## 2021-06-10 MED ORDER — OLMESARTAN MEDOXOMIL 40 MG PO TABS: 40.0000 mg | ORAL_TABLET | Freq: Every day | ORAL | 1 refills | Status: DC

## 2021-06-10 MED ORDER — HYDROCHLOROTHIAZIDE 12.5 MG PO CAPS: 12.5000 mg | ORAL_CAPSULE | Freq: Every day | ORAL | 1 refills | Status: DC

## 2021-06-10 MED ORDER — AMLODIPINE BESYLATE 5 MG PO TABS: 5.0000 mg | ORAL_TABLET | Freq: Every day | ORAL | 1 refills | Status: DC

## 2021-06-10 NOTE — Telephone Encounter (Signed)
Pt called in and states Olmesartan-amLODIPine-HCTZ 40-5-12.5 MG TABS will no longer be covered under insurance.  ? ?Pt also states he needs a refill on Eliquis, as it has expired. ? ?[id number]  [phone] ?Fax:  [phone] ?  ? ?

## 2021-06-10 NOTE — Telephone Encounter (Signed)
Ok I changed to the separate rx x 3 which should be covered ?

## 2021-06-11 NOTE — Telephone Encounter (Signed)
Notified pt with MD sent new BP 's meds to [facility],,/[facility] ?

## 2021-06-12 ENCOUNTER — Other Ambulatory Visit: Payer: Self-pay | Admitting: Internal Medicine

## 2021-06-12 DIAGNOSIS — I5032 Chronic diastolic (congestive) heart failure: Secondary | ICD-10-CM

## 2021-06-13 ENCOUNTER — Other Ambulatory Visit: Payer: Self-pay | Admitting: Internal Medicine

## 2021-06-13 NOTE — Telephone Encounter (Signed)
Please refill as per office routine med refill policy (all routine meds to be refilled for 3 mo or monthly (per pt preference) up to one year from last visit, then month to month grace period for 3 mo, then further med refills will have to be denied) ? ?

## 2021-06-17 ENCOUNTER — Ambulatory Visit (INDEPENDENT_AMBULATORY_CARE_PROVIDER_SITE_OTHER): Payer: PPO | Admitting: Cardiology

## 2021-06-17 ENCOUNTER — Encounter: Payer: Self-pay | Admitting: Cardiology

## 2021-06-17 VITALS — BP 102/60 | HR 47 | Ht 66.0 in | Wt 207.0 lb

## 2021-06-17 DIAGNOSIS — I10 Essential (primary) hypertension: Secondary | ICD-10-CM

## 2021-06-17 DIAGNOSIS — G4733 Obstructive sleep apnea (adult) (pediatric): Secondary | ICD-10-CM | POA: Diagnosis not present

## 2021-06-17 NOTE — Patient Instructions (Signed)

## 2021-06-17 NOTE — Progress Notes (Signed)
?Date:  06/17/2021  ? ?ID:  Scott [name]., DOB 1955-11-27, MRN [id number] ? ? ?PCP:  [name], MD  ?Cardiologist:  Cristopher Peru, MD ?Electrophysiologist:  None  ? ?Chief Complaint:  OSA ? ?History of Present Illness:   ? ?Scott Lizer [name]. is a 66 y.o. male  with a hx of PAF, CHF and CAD who was referred for sleep study due to afib.  He underwent Sleep study showing severe OSA with an AHI of 74/hr and O2 sats as low as 83% with loud snoring.  He underwent CPAP titration to 9cm H2O.   ? ?He is doing well with his CPAP device and thinks that he has gotten used to it.  He tolerates the mask and feels the pressure is adequate.  Since going on CPAP he feels rested in the am and has no significant daytime sleepiness.  He does occasionally take a nap during the day.  He denies any significant mouth or nasal dryness or nasal congestion.  He does not think that he snores.    ? ?Prior CV studies:   ?The following studies were reviewed today: ? ?PAP compliance download ? ?Past Medical History:  ?Diagnosis Date  ? ALLERGIC RHINITIS 01/14/2007  ? Allergy   ? ANXIETY 09/28/2006  ? Asbestos exposure   ? Atrial fibrillation ([place]) 09/28/2006  ? s/p afib ablation x 2  ? CHF (congestive heart failure) ([place])   ? CKD (chronic kidney disease), stage III ([place])   ? patient denies  ? COPD (chronic obstructive pulmonary disease) ([place])   ? Coronary artery disease 11/24/2011  ? Diastolic dysfunction 03/04/3788  ? Dyspnea   ? W/ EXERTION   ? Dysrhythmia   ? AFIB     ? First degree AV block 10/12/2018  ? Noted on EKG  ? Gallstones   ? Hyperglycemia   ? HYPERLIPIDEMIA 09/28/2006  ? HYPERTENSION 09/28/2006  ? HYPOTHYROIDISM, ACQUIRED NEC 09/28/2006  ? pt denies and doesn't know  ? Long term current use of anticoagulant 04/08/2010  ? Obese   ? OBSTRUCTIVE SLEEP APNEA 01/06/2008  ? NOT USING    ? Right knee DJD 07/09/2010  ? S/P CABG x 1 11/26/2011  ? Right internal mammary artery to right coronary artery  ? S/P Maze operation for atrial fibrillation  11/26/2011  ? complete biatrial lesion set with clipping of LA appendage  ? Sleep apnea   ? ?Past Surgical History:  ?Procedure Laterality Date  ? ANTERIOR CERVICAL DECOMP/DISCECTOMY FUSION N/A 06/20/2016  ? Procedure: Anterior Cervical Discectomy Fusion - Cervical five-Cervical six - Cervical six-Cervical seven - Cervicla seven- Thoracic one;  Surgeon: Earnie Larsson, MD;  Location: [facility];  Service: Neurosurgery;  Laterality: N/A;  ? CARDIOVERSION    ? X4   ? CORONARY ARTERY BYPASS GRAFT  11/26/2011  ? Procedure: CORONARY ARTERY BYPASS GRAFTING (CABG);  Surgeon: Rexene Alberts, MD;  Location: [facility];  Service: Open Heart Surgery;  Laterality: N/A;  coronary artery bypass on pump times one utilizing the right internal mammary artery, transesophageal echocardiogram   ? HAND SURGERY Left   ? LEFT HEART CATHETERIZATION WITH CORONARY ANGIOGRAM N/A 11/24/2011  ? Procedure: LEFT HEART CATHETERIZATION WITH CORONARY ANGIOGRAM;  Surgeon: Peter M Martinique, MD;  Location: [facility] CATH LAB;  Service: Cardiovascular;  Laterality: N/A;  ? LOOP RECORDER IMPLANT N/A 07/06/2012  ? Procedure: LOOP RECORDER IMPLANT;  Surgeon: Thompson Grayer, MD;  Location: [facility] CATH LAB;  Service: Cardiovascular;  Laterality: N/A;  ?  MAZE  11/26/2011  ? Procedure: MAZE;  Surgeon: Rexene Alberts, MD;  Location: [place];  Service: Open Heart Surgery;  Laterality: N/A;  [place]aze   ? Radiofrequency Ablation for atrial fibrillation  12/30/07 and 06/05/09  ? afib ablation x 2 by JA  ? Radiofrequency ablation for atrial flutter  2005  ? CTI  ? TOTAL KNEE ARTHROPLASTY Right 04/05/2019  ? Procedure: RIGHT TOTAL KNEE ARTHROPLASTY;  Surgeon: Melrose Nakayama, MD;  Location: [facility];  Service: Orthopedics;  Laterality: Right;  ?  ? ?Current Meds  ?Medication Sig  ? acetaminophen (TYLENOL) 500 MG tablet Take 1,000 mg by mouth every 6 (six) hours as needed for mild pain.   ? albuterol (PROAIR HFA) 108 (90 Base) MCG/ACT inhaler TAKE 2 PUFFS BY MOUTH EVERY 6 HOURS AS NEEDED FOR WHEEZE OR  SHORTNESS OF BREATH  ? ALPRAZolam (XANAX) 0.5 MG tablet TAKE 1 TABLET BY MOUTH TWICE A DAY AS NEEDED FOR ANXIETY  ? amLODipine (NORVASC) 5 MG tablet Take 1 tablet (5 mg total) by mouth daily.  ? apixaban (ELIQUIS) 5 MG TABS tablet TAKE 1 TABLET BY MOUTH TWICE A DAY  ? budesonide-formoterol (SYMBICORT) 160-4.5 MCG/ACT inhaler TAKE 2 PUFFS BY MOUTH TWICE A DAY  ? cholecalciferol (VITAMIN D) 1000 units tablet Take 1,000 Units by mouth daily.  ? cyclobenzaprine (FLEXERIL) 5 MG tablet Take 1 tablet (5 mg total) by mouth 3 (three) times daily as needed for muscle spasms.  ? docusate sodium (COLACE) 100 MG capsule Take 200 mg by mouth daily as needed for mild constipation.   ? furosemide (LASIX) 20 MG tablet TAKE 1 TABLET BY MOUTH EVERY DAY AS NEEDED  ? gabapentin (NEURONTIN) 100 MG capsule TAKE 1 CAPSULE BY MOUTH THREE TIMES A DAY  ? hydrochlorothiazide (MICROZIDE) 12.5 MG capsule Take 1 capsule (12.5 mg total) by mouth daily.  ? lovastatin (MEVACOR) 40 MG tablet TAKE 2 TABLETS (80 MG TOTAL) BY MOUTH EVERY EVENING.  ? metoprolol succinate (TOPROL-XL) 50 MG 24 hr tablet TAKE 1 AND 1/2 TABLETS BY MOUTH DAILY  ? Multiple Vitamin (MULTIVITAMIN WITH MINERALS) TABS tablet Take 1 tablet by mouth daily.   ? olmesartan (BENICAR) 40 MG tablet Take 1 tablet (40 mg total) by mouth daily.  ? spironolactone (ALDACTONE) 25 MG tablet Take 0.5 tablets (12.5 mg total) by mouth at bedtime.  ? temazepam (RESTORIL) 30 MG capsule Take 1 capsule (30 mg total) by mouth at bedtime as needed. for sleep  ? traMADol (ULTRAM) 50 MG tablet Take 1 tablet (50 mg total) by mouth every 6 (six) hours as needed.  ?  ? ?Allergies:   Zolpidem tartrate  ? ?Social History  ? ?Tobacco Use  ? Smoking status: Former  ?  Packs/day: 1.50  ?  Years: 26.00  ?  Pack years: 39.00  ?  Types: Cigarettes  ?  Quit date: 04/06/1995  ?  Years since quitting: 26.2  ? Smokeless tobacco: Never  ?Vaping Use  ? Vaping Use: Never used  ?Substance Use Topics  ? Alcohol use: No  ?   Alcohol/week: 0.0 standard drinks  ? Drug use: No  ?  ? ?Family Hx: ?The patient's family history includes Atrial fibrillation in his mother; Cancer in his paternal grandmother; Dementia in his father and paternal grandfather; Hypertension in his father; Stroke in his mother. There is no history of Colon cancer, Esophageal cancer, Prostate cancer, Rectal cancer, or Stomach cancer. ? ?ROS:   ?Please see the history of present illness.    ? ?  All other systems reviewed and are negative. ? ? ?Labs/Other Tests and Data Reviewed:   ? ?Recent Labs: ?07/13/2020: ALT 18; TSH 3.36 ?01/01/2021: BUN 19; Creatinine, Ser 1.48; Hemoglobin 14.2; Platelets 221.0; Potassium 4.3; Sodium 140  ? ?Recent Lipid Panel ?Lab Results  ?Component Value Date/Time  ? CHOL 145 07/13/2020 10:31 AM  ? TRIG 90.0 07/13/2020 10:31 AM  ? HDL 42.40 07/13/2020 10:31 AM  ? CHOLHDL 3 07/13/2020 10:31 AM  ? LDLCALC 85 07/13/2020 10:31 AM  ? ? ?Wt Readings from Last 3 Encounters:  ?06/17/21 207 lb (93.9 kg)  ?02/14/21 213 lb (96.6 kg)  ?01/01/21 208 lb (94.3 kg)  ?  ? ?Objective:   ? ?Vital Signs:  BP 102/60   Pulse (!) 47   Ht '5\' 6"'$  (1.676 m)   Wt 207 lb (93.9 kg)   SpO2 97%   BMI 33.41 kg/m?   ? ?GEN: Well nourished, well developed in no acute distress ?HEENT: Normal ?NECK: No JVD; No carotid bruits ?LYMPHATICS: No lymphadenopathy ?CARDIAC:RRR, no murmurs, rubs, gallops ?RESPIRATORY:  Clear to auscultation without rales, wheezing or rhonchi  ?ABDOMEN: Soft, non-tender, non-distended ?MUSCULOSKELETAL:  No edema; No deformity  ?SKIN: Warm and dry ?NEUROLOGIC:  Alert and oriented x 3 ?PSYCHIATRIC:  Normal affect   ? ?ASSESSMENT & PLAN:   ? ?1.  OSA - The patient is tolerating PAP therapy well without any problems. The PAP download performed by his DME was personally reviewed and interpreted by me today and showed an AHI of 5.8 /hr on auto CPAP with 80% compliance in using more than 4 hours nightly.  The patient has been using and benefiting from PAP use  and will continue to benefit from therapy.  ?  ?2 . HTN ?-BP is adequately controlled on exam today ?-Continue prescription drug management with Toprol-XL 75 mg daily, amlodipine '5mg'$  daily, Benicar '40mg'$  daily, HCTZ 1

## 2021-06-24 ENCOUNTER — Other Ambulatory Visit: Payer: Self-pay | Admitting: Internal Medicine

## 2021-06-25 DIAGNOSIS — I119 Hypertensive heart disease without heart failure: Secondary | ICD-10-CM | POA: Diagnosis not present

## 2021-07-04 ENCOUNTER — Other Ambulatory Visit: Payer: Self-pay | Admitting: Internal Medicine

## 2021-07-04 DIAGNOSIS — I5032 Chronic diastolic (congestive) heart failure: Secondary | ICD-10-CM

## 2021-07-22 ENCOUNTER — Other Ambulatory Visit: Payer: Self-pay | Admitting: Internal Medicine

## 2021-07-22 NOTE — Telephone Encounter (Signed)
Please refill as per office routine med refill policy (all routine meds to be refilled for 3 mo or monthly (per pt preference) up to one year from last visit, then month to month grace period for 3 mo, then further med refills will have to be denied) ? ?

## 2021-07-30 ENCOUNTER — Other Ambulatory Visit: Payer: Self-pay | Admitting: Internal Medicine

## 2021-08-17 ENCOUNTER — Other Ambulatory Visit: Payer: Self-pay | Admitting: Internal Medicine

## 2021-08-26 ENCOUNTER — Other Ambulatory Visit: Payer: Self-pay | Admitting: Internal Medicine

## 2021-08-26 NOTE — Telephone Encounter (Signed)
Please refill as per office routine med refill policy (all routine meds to be refilled for 3 mo or monthly (per pt preference) up to one year from last visit, then month to month grace period for 3 mo, then further med refills will have to be denied) ? ?

## 2021-09-02 ENCOUNTER — Ambulatory Visit (INDEPENDENT_AMBULATORY_CARE_PROVIDER_SITE_OTHER): Payer: PPO | Admitting: *Deleted

## 2021-09-02 DIAGNOSIS — I5042 Chronic combined systolic (congestive) and diastolic (congestive) heart failure: Secondary | ICD-10-CM

## 2021-09-02 DIAGNOSIS — J449 Chronic obstructive pulmonary disease, unspecified: Secondary | ICD-10-CM

## 2021-09-02 NOTE — Chronic Care Management (AMB) (Signed)
Chronic Care Management   CCM RN Visit Note  09/02/2021 Name: Scott [name]. MRN: [id number] DOB: [birth year]  Subjective: Scott Lobo [name]. is a 66 y.o. year old male who is a primary care patient of [name], MD. The care management team was consulted for assistance with disease management and care coordination needs.    Engaged with patient by telephone for follow up visit/ CCM RN CM case closure in response to provider referral for case management and/or care coordination services.   Consent to Services:  The patient was given information about Chronic Care Management services, agreed to services, and gave verbal consent prior to initiation of services.  Please see initial visit note for detailed documentation.  Patient agreed to services and verbal consent obtained.   Assessment: Review of patient past medical history, allergies, medications, health status, including review of consultants reports, laboratory and other test data, was performed as part of comprehensive evaluation and provision of chronic care management services.   SDOH (Social Determinants of Health) assessments and interventions performed:  SDOH Interventions    Flowsheet Row Most Recent Value  SDOH Interventions   Food Insecurity Interventions Intervention Not Indicated  [Denies food insecurity]  Transportation Interventions Intervention Not Indicated  [patient continues to drive self]     CCM Care Plan  Allergies  Allergen Reactions   Zolpidem Tartrate Other (See Comments)    Extremely drowsy the next day   Outpatient Encounter Medications as of 09/02/2021  Medication Sig   spironolactone (ALDACTONE) 25 MG tablet TAKE 0.5 TABLETS BY MOUTH AT BEDTIME.   acetaminophen (TYLENOL) 500 MG tablet Take 1,000 mg by mouth every 6 (six) hours as needed for mild pain.    albuterol (PROAIR HFA) 108 (90 Base) MCG/ACT inhaler TAKE 2 PUFFS BY MOUTH EVERY 6 HOURS AS NEEDED FOR WHEEZE OR SHORTNESS OF BREATH    ALPRAZolam (XANAX) 0.5 MG tablet TAKE 1 TABLET BY MOUTH TWICE A DAY AS NEEDED FOR ANXIETY   amLODipine (NORVASC) 5 MG tablet Take 1 tablet (5 mg total) by mouth daily.   budesonide-formoterol (SYMBICORT) 160-4.5 MCG/ACT inhaler TAKE 2 PUFFS BY MOUTH TWICE A DAY   cholecalciferol (VITAMIN D) 1000 units tablet Take 1,000 Units by mouth daily.   cyclobenzaprine (FLEXERIL) 5 MG tablet Take 1 tablet (5 mg total) by mouth 3 (three) times daily as needed for muscle spasms.   docusate sodium (COLACE) 100 MG capsule Take 200 mg by mouth daily as needed for mild constipation.    ELIQUIS 5 MG TABS tablet TAKE 1 TABLET BY MOUTH TWICE A DAY   furosemide (LASIX) 20 MG tablet TAKE 1 TABLET BY MOUTH EVERY DAY AS NEEDED   gabapentin (NEURONTIN) 100 MG capsule TAKE 1 CAPSULE BY MOUTH THREE TIMES A DAY   hydrochlorothiazide (MICROZIDE) 12.5 MG capsule Take 1 capsule (12.5 mg total) by mouth daily.   lovastatin (MEVACOR) 40 MG tablet TAKE 2 TABLETS (80 MG TOTAL) BY MOUTH EVERY EVENING.   metoprolol succinate (TOPROL-XL) 50 MG 24 hr tablet TAKE 1 AND 1/2 TABLETS BY MOUTH DAILY   Multiple Vitamin (MULTIVITAMIN WITH MINERALS) TABS tablet Take 1 tablet by mouth daily.    olmesartan (BENICAR) 40 MG tablet Take 1 tablet (40 mg total) by mouth daily.   temazepam (RESTORIL) 30 MG capsule TAKE 1 CAPSULE (30 MG TOTAL) BY MOUTH AT BEDTIME AS NEEDED. FOR SLEEP   traMADol (ULTRAM) 50 MG tablet TAKE 1 TABLET BY MOUTH EVERY 6 HOURS AS NEEDED.   No  facility-administered encounter medications on file as of 09/02/2021.   Patient Active Problem List   Diagnosis Date Noted   Low back pain 01/01/2021   Lower GI bleeding 01/01/2021   Pain due to total right knee replacement ([place]) 01/09/2020   Primary osteoarthritis of right knee 04/05/2019   CKD (chronic kidney disease) stage 3, GFR 30-59 ml/min (HCC) 03/14/2019   Degenerative joint disease of knee, right 09/27/2018   Dyspnea 09/06/2018   Right knee pain 09/06/2018   Radiculitis  of left cervical region 03/05/2018   Plantar fasciitis, left 08/19/2017   Chest pain 02/27/2017   Right ear pain 02/27/2017   Left groin pain 09/13/2016   Rib pain on left side 09/13/2016   Left leg pain 09/13/2016   Stenosis of cervical spine with myelopathy ([place]) 06/20/2016   Special screening for malignant neoplasms, colon 04/04/2016   Cough 01/11/2016   COPD exacerbation ([place]) 12/22/2015   Rectal bleeding 08/14/2014   Intercostal muscle strain 08/07/2014   Chronic meniscal tear of knee 04/06/2014   Heel bone fracture 04/06/2014   Hyperglycemia 04/04/2014   Recurrent falls 04/04/2014   Bilateral knee pain 04/04/2014   Intractable left heel pain 04/04/2014   Insomnia 04/04/2014   Hematochezia 04/04/2014   Bladder neck obstruction 12/19/2013   Encounter for therapeutic drug monitoring 03/30/2013   Palpitations 07/06/2012   Shortness of breath 01/19/2012   Sick sinus syndrome ([place]) 12/01/2011   S/P CABG x 1 11/26/2011   S/P Maze operation for atrial fibrillation 11/26/2011   CAD (coronary artery disease) 11/25/2011   Paroxysmal atrial fibrillation ([place]) 11/24/2011   Chronic combined systolic (congestive) and diastolic (congestive) heart failure ([place]) 11/24/2011   Gross hematuria 12/12/2010   Left knee pain 12/12/2010   Left knee DJD 07/09/2010   Preventative health care 07/07/2010   Chronic anticoagulation 04/08/2010   COPD (chronic obstructive pulmonary disease) ([place]) 10/04/2008   OBSTRUCTIVE SLEEP APNEA 01/06/2008   ALLERGIC RHINITIS 01/14/2007   Hypothyroidism 09/28/2006   Hyperlipidemia 09/28/2006   Anxiety state 09/28/2006   Essential hypertension 09/28/2006   Conditions to be addressed/monitored:  CHF and COPD  Care Plan : General Nursing  (Adult)  Updates made by Knox Royalty, RN since 09/02/2021 12:00 AM     Problem: Chronic Disease Management Needs   Priority: Medium     Long-Range Goal: Development of plan of care for long term chronic disease  management   Start Date: 09/28/2020  Expected End Date: 09/28/2021  Priority: Medium  Note:   Current Barriers:  Chronic Disease Management support and education needs related to CHF and COPD  RNCM Clinical Goal(s):  Patient will demonstrate ongoing adherence to prescribed treatment plan for CHF and COPD as evidenced by adherence to prescribed medication regimen contacting provider for new or worsened symptoms or questions following action plan for COPD/ CHF, including monitoring/ recording weekly weights and blood pressures at home; following heart healthy, low salt diet; ongoing activity/ exercise  through collaboration with RN Care manager, provider, and care team.   Interventions: 1:1 collaboration with primary care provider regarding development and update of comprehensive plan of care as evidenced by provider attestation and co-signature Inter-disciplinary care team collaboration (see longitudinal plan of care) Evaluation of current treatment plan related to  self management and patient's adherence to plan as established by provider; SDOH updated: no new/ unmet concerns identified Discussed plans with patient for ongoing care management follow up- patient denies current care coordination/ care management needs and is agreeable to CCM RN  CM case closure today; verbalizes understanding to contact PCP or other care providers for any needs that arise in the future, and confirms he has contact information for all care providers     COPD: (Status: 09/02/21: Goal Met.) Long Term goal Advised patient to track and manage COPD triggers Provided instruction about proper use of medications used for management of COPD including inhalers Advised patient to self assesses COPD action plan zone and make appointment with provider if in the yellow zone for 48 hours without improvement Advised patient to engage in light exercise as tolerated 3-5 days a week to aid in the the management of COPD Discussed the  importance of adequate rest and management of fatigue with COPD Confirmed patient breathing status at baseline: he continues to remain active and use rescue inhaler as needed for ongoing activity intolerance Reinforced difference between maintenance and rescue inhalers- patient verbalizes ongoing good general understanding of same; confirms he is using both as prescribed Confirmed patient continues to use CPAP q HS- denies issues/ concerns with CPAP Reports has continued attending [employer] for [employer]: has regular workout sessions on T/Th; positive reinforcement provided with encouragement to continue efforts Confirmed patient continues following "healthy diet;" importance of avoiding foods high in salt discussed with patient; education provided  Heart Failure Interventions:  (Status: 09/02/21: Goal Met.) Long Term Goal Discussed the importance of keeping all appointments with provider Provided patient with education about the role of exercise in the management of heart failure Reviewed with patient recent weights at home: he reports recent weight ranges between 186-190, with a weight "most generally" "recently" of "190 lbs;" this represents a weight gain from our last outreach 04/08/21; patient confirms he has not experienced weight gain > 3 lbs overnight/ 5 lbs in one week; confirms no recent changes in clinical condition: he remains able to walk his large dogs daily 1-2 miles Reviewed recent cardiology provider office visit 06/17/21: he confirms he continues using CPAP and denies post-visit changes to plan  Reinforced rationale/ purpose of regular weight monitoring and recording at home; weight gain guidelines in setting of CHF, corresponding action plan for weight gain     Plan: No further follow up required:  patient denies current care coordination/ care management needs and is agreeable to CCM RN CM case closure today; CCM RN CM case closure  [name], RN, BSN, [place] [phone]: direct office

## 2021-09-08 ENCOUNTER — Other Ambulatory Visit: Payer: Self-pay | Admitting: Internal Medicine

## 2021-09-09 ENCOUNTER — Other Ambulatory Visit: Payer: Self-pay | Admitting: Internal Medicine

## 2021-09-09 NOTE — Telephone Encounter (Signed)
Please refill as per office routine med refill policy (all routine meds to be refilled for 3 mo or monthly (per pt preference) up to one year from last visit, then month to month grace period for 3 mo, then further med refills will have to be denied) ? ?

## 2021-09-10 DIAGNOSIS — J449 Chronic obstructive pulmonary disease, unspecified: Secondary | ICD-10-CM | POA: Diagnosis not present

## 2021-09-23 DIAGNOSIS — I5042 Chronic combined systolic (congestive) and diastolic (congestive) heart failure: Secondary | ICD-10-CM | POA: Diagnosis not present

## 2021-09-23 DIAGNOSIS — J449 Chronic obstructive pulmonary disease, unspecified: Secondary | ICD-10-CM | POA: Diagnosis not present

## 2021-09-24 ENCOUNTER — Other Ambulatory Visit: Payer: Self-pay | Admitting: Internal Medicine

## 2021-09-24 NOTE — Telephone Encounter (Signed)
Please refill as per office routine med refill policy (all routine meds to be refilled for 3 mo or monthly (per pt preference) up to one year from last visit, then month to month grace period for 3 mo, then further med refills will have to be denied) ? ?

## 2021-09-30 ENCOUNTER — Other Ambulatory Visit: Payer: Self-pay | Admitting: Internal Medicine

## 2021-09-30 NOTE — Telephone Encounter (Signed)
Please refill as per office routine med refill policy (all routine meds to be refilled for 3 mo or monthly (per pt preference) up to one year from last visit, then month to month grace period for 3 mo, then further med refills will have to be denied) ? ?

## 2021-10-03 ENCOUNTER — Encounter: Payer: Self-pay | Admitting: Internal Medicine

## 2021-10-03 ENCOUNTER — Ambulatory Visit (INDEPENDENT_AMBULATORY_CARE_PROVIDER_SITE_OTHER): Payer: PPO | Admitting: Internal Medicine

## 2021-10-03 VITALS — BP 114/60 | HR 52 | Temp 98.4°F | Ht 66.0 in | Wt 210.4 lb

## 2021-10-03 DIAGNOSIS — M545 Low back pain, unspecified: Secondary | ICD-10-CM

## 2021-10-03 DIAGNOSIS — E559 Vitamin D deficiency, unspecified: Secondary | ICD-10-CM

## 2021-10-03 DIAGNOSIS — Z125 Encounter for screening for malignant neoplasm of prostate: Secondary | ICD-10-CM | POA: Diagnosis not present

## 2021-10-03 DIAGNOSIS — I10 Essential (primary) hypertension: Secondary | ICD-10-CM

## 2021-10-03 DIAGNOSIS — Z0001 Encounter for general adult medical examination with abnormal findings: Secondary | ICD-10-CM

## 2021-10-03 DIAGNOSIS — E538 Deficiency of other specified B group vitamins: Secondary | ICD-10-CM

## 2021-10-03 DIAGNOSIS — E7800 Pure hypercholesterolemia, unspecified: Secondary | ICD-10-CM

## 2021-10-03 DIAGNOSIS — N1831 Chronic kidney disease, stage 3a: Secondary | ICD-10-CM

## 2021-10-03 DIAGNOSIS — R739 Hyperglycemia, unspecified: Secondary | ICD-10-CM | POA: Diagnosis not present

## 2021-10-03 DIAGNOSIS — T8484XD Pain due to internal orthopedic prosthetic devices, implants and grafts, subsequent encounter: Secondary | ICD-10-CM

## 2021-10-03 DIAGNOSIS — Z96651 Presence of right artificial knee joint: Secondary | ICD-10-CM

## 2021-10-03 LAB — BASIC METABOLIC PANEL
BUN: 33 mg/dL — ABNORMAL HIGH (ref 6–23)
CO2: 28 mEq/L (ref 19–32)
Calcium: 9 mg/dL (ref 8.4–10.5)
Chloride: 103 mEq/L (ref 96–112)
Creatinine, Ser: 1.87 mg/dL — ABNORMAL HIGH (ref 0.40–1.50)
GFR: 37.01 mL/min — ABNORMAL LOW (ref >60.00)
Glucose, Bld: 103 mg/dL — ABNORMAL HIGH (ref 70–99)
Potassium: 4.3 mEq/L (ref 3.5–5.1)
Sodium: 142 mEq/L (ref 135–145)

## 2021-10-03 LAB — CBC WITH DIFFERENTIAL/PLATELET
Basophils Absolute: 0.1 10*3/uL (ref 0.0–0.1)
Basophils Relative: 0.9 % (ref 0.0–3.0)
Eosinophils Absolute: 0.4 10*3/uL (ref 0.0–0.7)
Eosinophils Relative: 5.2 % — ABNORMAL HIGH (ref 0.0–5.0)
HCT: 43.1 % (ref 39.0–52.0)
Hemoglobin: 14.6 g/dL (ref 13.0–17.0)
Lymphocytes Relative: 16.9 % (ref 12.0–46.0)
Lymphs Abs: 1.2 10*3/uL (ref 0.7–4.0)
MCHC: 33.8 g/dL (ref 30.0–36.0)
MCV: 95.7 fl (ref 78.0–100.0)
Monocytes Absolute: 0.6 10*3/uL (ref 0.1–1.0)
Monocytes Relative: 9 % (ref 3.0–12.0)
Neutro Abs: 4.9 10*3/uL (ref 1.4–7.7)
Neutrophils Relative %: 68 % (ref 43.0–77.0)
Platelets: 187 10*3/uL (ref 150.0–400.0)
RBC: 4.51 Mil/uL (ref 4.22–5.81)
RDW: 13.2 % (ref 11.5–15.5)
WBC: 7.2 10*3/uL (ref 4.0–10.5)

## 2021-10-03 LAB — LIPID PANEL
Cholesterol: 122 mg/dL (ref 0–200)
HDL: 39.1 mg/dL (ref >39.00)
LDL Cholesterol: 67 mg/dL (ref 0–99)
NonHDL: 83.29
Total CHOL/HDL Ratio: 3
Triglycerides: 79 mg/dL (ref 0.0–149.0)
VLDL: 15.8 mg/dL (ref 0.0–40.0)

## 2021-10-03 LAB — MICROALBUMIN / CREATININE URINE RATIO
Creatinine,U: 125.5 mg/dL
Microalb Creat Ratio: 0.6 mg/g (ref 0.0–30.0)
Microalb, Ur: <0.7 mg/dL (ref 0.0–1.9)

## 2021-10-03 LAB — URINALYSIS, ROUTINE W REFLEX MICROSCOPIC
Bilirubin Urine: NEGATIVE
Hgb urine dipstick: NEGATIVE
Ketones, ur: NEGATIVE
Leukocytes,Ua: NEGATIVE
Nitrite: NEGATIVE
Specific Gravity, Urine: 1.025 (ref 1.000–1.030)
Total Protein, Urine: NEGATIVE
Urine Glucose: NEGATIVE
Urobilinogen, UA: 0.2 (ref 0.0–1.0)
pH: 5.5 (ref 5.0–8.0)

## 2021-10-03 LAB — HEPATIC FUNCTION PANEL
ALT: 21 U/L (ref 0–53)
AST: 21 U/L (ref 0–37)
Albumin: 4.2 g/dL (ref 3.5–5.2)
Alkaline Phosphatase: 56 U/L (ref 39–117)
Bilirubin, Direct: 0.1 mg/dL (ref 0.0–0.3)
Total Bilirubin: 0.6 mg/dL (ref 0.2–1.2)
Total Protein: 6.9 g/dL (ref 6.0–8.3)

## 2021-10-03 LAB — VITAMIN D 25 HYDROXY (VIT D DEFICIENCY, FRACTURES): VITD: 44.75 ng/mL (ref 30.00–100.00)

## 2021-10-03 LAB — PSA: PSA: 0.84 ng/mL (ref 0.10–4.00)

## 2021-10-03 LAB — VITAMIN B12: Vitamin B-12: 506 pg/mL (ref 211–911)

## 2021-10-03 LAB — HEMOGLOBIN A1C: Hgb A1c MFr Bld: 5.6 % (ref 4.6–6.5)

## 2021-10-03 LAB — TSH: TSH: 2.85 u[IU]/mL (ref 0.35–5.50)

## 2021-10-03 MED ORDER — HYDROCHLOROTHIAZIDE 12.5 MG PO CAPS: 12.5000 mg | ORAL_CAPSULE | Freq: Every day | ORAL | 3 refills | Status: DC

## 2021-10-03 MED ORDER — TEMAZEPAM 30 MG PO CAPS: 30.0000 mg | ORAL_CAPSULE | Freq: Every evening | ORAL | 1 refills | Status: DC | PRN

## 2021-10-03 MED ORDER — GABAPENTIN 100 MG PO CAPS: ORAL_CAPSULE | ORAL | 3 refills | Status: DC

## 2021-10-03 MED ORDER — APIXABAN 5 MG PO TABS: 5.0000 mg | ORAL_TABLET | Freq: Two times a day (BID) | ORAL | 3 refills | Status: DC

## 2021-10-03 MED ORDER — LOVASTATIN 40 MG PO TABS: 80.0000 mg | ORAL_TABLET | Freq: Every evening | ORAL | 3 refills | Status: DC

## 2021-10-03 MED ORDER — AMLODIPINE BESYLATE 5 MG PO TABS
5.0000 mg | ORAL_TABLET | Freq: Every day | ORAL | 3 refills | Status: DC
Start: 2021-10-03 — End: 2022-11-03

## 2021-10-03 MED ORDER — TRAMADOL HCL 50 MG PO TABS: 50.0000 mg | ORAL_TABLET | Freq: Four times a day (QID) | ORAL | 5 refills | Status: DC | PRN

## 2021-10-03 MED ORDER — OLMESARTAN MEDOXOMIL 40 MG PO TABS
40.0000 mg | ORAL_TABLET | Freq: Every day | ORAL | 3 refills | Status: DC
Start: 2021-10-03 — End: 2022-11-03

## 2021-10-03 MED ORDER — METOPROLOL SUCCINATE ER 50 MG PO TB24: ORAL_TABLET | ORAL | 3 refills | Status: DC

## 2021-10-03 MED ORDER — SPIRONOLACTONE 25 MG PO TABS: ORAL_TABLET | ORAL | 3 refills | Status: DC

## 2021-10-03 MED ORDER — ALPRAZOLAM 0.5 MG PO TABS: ORAL_TABLET | ORAL | 2 refills | Status: DC

## 2021-10-03 NOTE — Patient Instructions (Signed)
Please continue all other medications as before, and refills have been done if requested.  Please have the pharmacy call with any other refills you may need.  Please continue your efforts at being more active, low cholesterol diet, and weight control.  You are otherwise up to date with prevention measures today.  Please keep your appointments with your specialists as you may have planned  You will be contacted regarding the referral for: Dr Rhona Raider orthopedic  Please go to the LAB at the blood drawing area for the tests to be done  You will be contacted by phone if any changes need to be made immediately.  Otherwise, you will receive a letter about your results with an explanation, but please check with MyChart first.  Please remember to sign up for MyChart if you have not done so, as this will be important to you in the future with finding out test results, communicating by private email, and scheduling acute appointments online when needed.  Please make an Appointment to return in 6 months, or sooner if needed

## 2021-10-03 NOTE — Progress Notes (Signed)
Patient ID: Scott Hones., male   DOB: 11-03-1955, 66 y.o.   MRN: [id number]        Chief Complaint:: wellness exam and right knee pain, hld, htn, hyperglycemia, ckd       HPI:  Scott Kaspar. is a 66 y.o. male here for wellness exam; decliens covid booster, o/w up to date                        Also c/o worsening 1 mo right knee pain over baseline mow mod to severe at times, dull and sharp with a feeling of instability but no falls or giveaways, now w/p TKR appprox 18 mo.  Pt denies chest pain, increased sob or doe, wheezing, orthopnea, PND, increased LE swelling, palpitations, dizziness or syncope.   Pt denies polydipsia, polyuria, or new focal neuro s/s.    Pt denies fever, wt loss, night sweats, loss of appetite, or other constitutional symptoms     Wt Readings from Last 3 Encounters:  10/03/21 210 lb 6.4 oz (95.4 kg)  06/17/21 207 lb (93.9 kg)  02/14/21 213 lb (96.6 kg)   BP Readings from Last 3 Encounters:  10/03/21 114/60  06/17/21 102/60  02/14/21 118/70   Immunization History  Administered Date(s) Administered   Influenza Inj Mdck Quad Pf 11/20/2018   Influenza Split 11/25/2011   Influenza Whole 01/13/2005, 11/26/2007, 12/06/2008, 11/29/2009   Influenza, High Dose Seasonal PF 12/03/2020   Influenza,inj,Quad PF,6+ Mos 03/08/2015, 11/04/2019   Influenza-Unspecified 10/25/2012, 11/21/2015, 11/25/2015, 12/25/2016, 12/10/2019   PFIZER Comirnaty(Gray Top)Covid-19 Tri-Sucrose Vaccine 07/12/2020   PFIZER(Purple Top)SARS-COV-2 Vaccination 05/26/2019, 06/20/2019, 12/16/2019, 07/13/2020   PNEUMOCOCCAL CONJUGATE-20 06/08/2020   Pfizer Covid-19 Vaccine Bivalent Booster 62yr & up 12/12/2020, 07/14/2021   Pneumococcal Conjugate-13 12/21/2015   Pneumococcal Polysaccharide-23 03/11/2011   Td 03/27/2004, 04/04/2014   Zoster Recombinat (Shingrix) 05/09/2021, 08/17/2021   Zoster, Live 01/04/2016  There are no preventive care reminders to display for this patient.    Past Medical  History:  Diagnosis Date   ALLERGIC RHINITIS 01/14/2007   Allergy    ANXIETY 09/28/2006   Asbestos exposure    Atrial fibrillation ([place] 09/28/2006   s/p afib ablation x 2   CHF (congestive heart failure) (HCC)    CKD (chronic kidney disease), stage III ([facility]    patient denies   COPD (chronic obstructive pulmonary disease) ([place]    Coronary artery disease 90/25/8527  Diastolic dysfunction 87/09/2421  Dyspnea    W/ EXERTION    Dysrhythmia    AFIB      First degree AV block 10/12/2018   Noted on EKG   Gallstones    Hyperglycemia    HYPERLIPIDEMIA 09/28/2006   HYPERTENSION 09/28/2006   HYPOTHYROIDISM, ACQUIRED NEC 09/28/2006   pt denies and doesn't know   Long term current use of anticoagulant 04/08/2010   Obese    OBSTRUCTIVE SLEEP APNEA 01/06/2008   NOT USING     Right knee DJD 07/09/2010   S/P CABG x 1 11/26/2011   Right internal mammary artery to right coronary artery   S/P Maze operation for atrial fibrillation 11/26/2011   complete biatrial lesion set with clipping of LA appendage   Sleep apnea    Past Surgical History:  Procedure Laterality Date   ANTERIOR CERVICAL DECOMP/DISCECTOMY FUSION N/A 06/20/2016   Procedure: Anterior Cervical Discectomy Fusion - Cervical five-Cervical six - Cervical six-Cervical seven - Cervicla seven- Thoracic one;  Surgeon: HEarnie Larsson MD;  Location: [facility];  Service: Neurosurgery;  Laterality: N/A;   CARDIOVERSION     X4    CORONARY ARTERY BYPASS GRAFT  11/26/2011   Procedure: CORONARY ARTERY BYPASS GRAFTING (CABG);  Surgeon: Rexene Alberts, MD;  Location: [facility];  Service: Open Heart Surgery;  Laterality: N/A;  coronary artery bypass on pump times one utilizing the right internal mammary artery, transesophageal echocardiogram    HAND SURGERY Left    LEFT HEART CATHETERIZATION WITH CORONARY ANGIOGRAM N/A 11/24/2011   Procedure: LEFT HEART CATHETERIZATION WITH CORONARY ANGIOGRAM;  Surgeon: Peter M Martinique, MD;  Location: [facility] CATH LAB;  Service:  Cardiovascular;  Laterality: N/A;   LOOP RECORDER IMPLANT N/A 07/06/2012   Procedure: LOOP RECORDER IMPLANT;  Surgeon: Thompson Grayer, MD;  Location: [facility] CATH LAB;  Service: Cardiovascular;  Laterality: N/A;   MAZE  11/26/2011   Procedure: MAZE;  Surgeon: Rexene Alberts, MD;  Location: [facility];  Service: Open Heart Surgery;  Laterality: N/A;  [place]    Radiofrequency Ablation for atrial fibrillation  12/30/07 and 06/05/09   afib ablation x 2 by JA   Radiofrequency ablation for atrial flutter  2005   CTI   TOTAL KNEE ARTHROPLASTY Right 04/05/2019   Procedure: RIGHT TOTAL KNEE ARTHROPLASTY;  Surgeon: Melrose Nakayama, MD;  Location: [facility];  Service: Orthopedics;  Laterality: Right;    reports that he quit smoking about 26 years ago. His smoking use included cigarettes. He has a 39.00 pack-year smoking history. He has never used smokeless tobacco. He reports that he does not drink alcohol and does not use drugs. family history includes Atrial fibrillation in his mother; Cancer in his paternal grandmother; Dementia in his father and paternal grandfather; Hypertension in his father; Stroke in his mother. Allergies  Allergen Reactions   Zolpidem Tartrate Other (See Comments)    Extremely drowsy the next day   Current Outpatient Medications on File Prior to Visit  Medication Sig Dispense Refill   acetaminophen (TYLENOL) 500 MG tablet Take 1,000 mg by mouth every 6 (six) hours as needed for mild pain.      albuterol (VENTOLIN HFA) 108 (90 Base) MCG/ACT inhaler TAKE 2 PUFFS BY MOUTH EVERY 6 HOURS AS NEEDED FOR WHEEZE OR SHORTNESS OF BREATH 8.5 each 5   budesonide-formoterol (SYMBICORT) 160-4.5 MCG/ACT inhaler TAKE 2 PUFFS BY MOUTH TWICE A DAY 30.6 each 4   cholecalciferol (VITAMIN D) 1000 units tablet Take 1,000 Units by mouth daily.     cyclobenzaprine (FLEXERIL) 5 MG tablet Take 1 tablet (5 mg total) by mouth 3 (three) times daily as needed for muscle spasms. 40 tablet 1   docusate sodium (COLACE) 100 MG  capsule Take 200 mg by mouth daily as needed for mild constipation.      furosemide (LASIX) 20 MG tablet TAKE 1 TABLET BY MOUTH EVERY DAY AS NEEDED 15 tablet 0   Multiple Vitamin (MULTIVITAMIN WITH MINERALS) TABS tablet Take 1 tablet by mouth daily.      No current facility-administered medications on file prior to visit.        ROS:  All others reviewed and negative.  Objective        PE:  BP 114/60 (BP Location: Left Arm, Patient Position: Sitting, Cuff Size: Large)   Pulse (!) 52   Temp 98.4 F (36.9 C) (Oral)   Ht '5\' 6"'$  (1.676 m)   Wt 210 lb 6.4 oz (95.4 kg)   SpO2 98%   BMI 33.96 kg/m  Constitutional: Pt appears in NAD               HENT: Head: NCAT.                Right Ear: External ear normal.                 Left Ear: External ear normal.                Eyes: . Pupils are equal, round, and reactive to light. Conjunctivae and EOM are normal               Nose: without d/c or deformity               Neck: Neck supple. Gross normal ROM               Cardiovascular: Normal rate and regular rhythm.                 Pulmonary/Chest: Effort normal and breath sounds without rales or wheezing.                Abd:  Soft, NT, ND, + BS, no organomegaly               Neurological: Pt is alert. At baseline orientation, motor grossly intact               Skin: Skin is warm. No rashes, no other new lesions, LE edema - none               Psychiatric: Pt behavior is normal without agitation   Micro: none  Cardiac tracings I have personally interpreted today:  none  Pertinent Radiological findings (summarize): none   Lab Results  Component Value Date   WBC 7.2 10/03/2021   HGB 14.6 10/03/2021   HCT 43.1 10/03/2021   PLT 187.0 10/03/2021   GLUCOSE 103 (H) 10/03/2021   CHOL 122 10/03/2021   TRIG 79.0 10/03/2021   HDL 39.10 10/03/2021   LDLCALC 67 10/03/2021   ALT 21 10/03/2021   AST 21 10/03/2021   NA 142 10/03/2021   K 4.3 10/03/2021   CL 103 10/03/2021    CREATININE 1.87 (H) 10/03/2021   BUN 33 (H) 10/03/2021   CO2 28 10/03/2021   TSH 2.85 10/03/2021   PSA 0.84 10/03/2021   INR 1.3 (H) 01/01/2021   HGBA1C 5.6 10/03/2021   MICROALBUR <0.7 10/03/2021   Assessment/Plan:  Scott Lobo [name]. is a 66 y.o. White or Caucasian [1] male with  has a past medical history of ALLERGIC RHINITIS (01/14/2007), Allergy, ANXIETY (09/28/2006), Asbestos exposure, Atrial fibrillation ([place]) (09/28/2006), CHF (congestive heart failure) ([place]), CKD (chronic kidney disease), stage III ([place]), COPD (chronic obstructive pulmonary disease) ([place]), Coronary artery disease (8/46/9629), Diastolic dysfunction (06/26/8411), Dyspnea, Dysrhythmia, First degree AV block (10/12/2018), Gallstones, Hyperglycemia, HYPERLIPIDEMIA (09/28/2006), HYPERTENSION (09/28/2006), HYPOTHYROIDISM, ACQUIRED NEC (09/28/2006), Long term current use of anticoagulant (04/08/2010), Obese, OBSTRUCTIVE SLEEP APNEA (01/06/2008), Right knee DJD (07/09/2010), S/P CABG x 1 (11/26/2011), S/P Maze operation for atrial fibrillation (11/26/2011), and Sleep apnea.  Encounter for well adult exam with abnormal findings Age and sex appropriate education and counseling updated with regular exercise and diet Referrals for preventative services - none needed Immunizations addressed - declines covid booster Smoking counseling  - none needed Evidence for depression or other mood disorder - none significant Most recent labs reviewed. I have personally reviewed and have noted: 1) the patient's medical and social history 2) The patient's  current medications and supplements 3) The patient's height, weight, and BMI have been recorded in the chart   Pain due to total right knee replacement (HCC) Worsening with some instability, no falls, pt now for referral to ortho Dr Rhona Raider for further consideration  Hyperlipidemia Lab Results  Component Value Date   LDLCALC 67 10/03/2021   Stable, pt to continue current statin lovastatin 80 mg  qd   Essential hypertension BP Readings from Last 3 Encounters:  10/03/21 114/60  06/17/21 102/60  02/14/21 118/70   Stable, pt to continue medical treatment norvasc 5 qd, hct 12.5 qd, toprol xl 75 qd, benicar 40 qd   Hyperglycemia Lab Results  Component Value Date   HGBA1C 5.6 10/03/2021   Stable, pt to continue current medical treatment  - diet, wt control, excercise   CKD (chronic kidney disease) stage 3, GFR 30-59 ml/min Lab Results  Component Value Date   CREATININE 1.87 (H) 10/03/2021   Stable overall, cont to avoid nephrotoxins  Followup: Return in about 6 months (around 04/05/2022).  Cathlean Cower, MD 10/06/2021 1:52 PM [facility]

## 2021-10-06 ENCOUNTER — Encounter: Payer: Self-pay | Admitting: Internal Medicine

## 2021-10-06 NOTE — Assessment & Plan Note (Signed)
Lab Results  Component Value Date   HGBA1C 5.6 10/03/2021   Stable, pt to continue current medical treatment - diet, wt control, excercise  

## 2021-10-06 NOTE — Assessment & Plan Note (Signed)
Lab Results  Component Value Date   CREATININE 1.87 (H) 10/03/2021   Stable overall, cont to avoid nephrotoxins

## 2021-10-06 NOTE — Assessment & Plan Note (Signed)
Lab Results  Component Value Date   LDLCALC 67 10/03/2021   Stable, pt to continue current statin lovastatin 80 mg qd

## 2021-10-06 NOTE — Assessment & Plan Note (Signed)
Worsening with some instability, no falls, pt now for referral to ortho Dr Rhona Raider for further consideration

## 2021-10-06 NOTE — Assessment & Plan Note (Signed)

## 2021-10-06 NOTE — Assessment & Plan Note (Signed)
BP Readings from Last 3 Encounters:  10/03/21 114/60  06/17/21 102/60  02/14/21 118/70   Stable, pt to continue medical treatment norvasc 5 qd, hct 12.5 qd, toprol xl 75 qd, benicar 40 qd

## 2021-10-07 ENCOUNTER — Encounter: Payer: Self-pay | Admitting: Internal Medicine

## 2021-10-08 NOTE — Progress Notes (Signed)
Printed & mailed letter to pt address.Marland KitchenJohny [name]

## 2021-10-14 DIAGNOSIS — M25551 Pain in right hip: Secondary | ICD-10-CM | POA: Diagnosis not present

## 2021-10-14 DIAGNOSIS — Z96651 Presence of right artificial knee joint: Secondary | ICD-10-CM | POA: Diagnosis not present

## 2021-10-14 DIAGNOSIS — M25561 Pain in right knee: Secondary | ICD-10-CM | POA: Diagnosis not present

## 2021-10-21 ENCOUNTER — Telehealth: Payer: Self-pay | Admitting: *Deleted

## 2021-10-21 NOTE — Telephone Encounter (Signed)
Will route to PharmD for rec's re: holding anticoagulation. Richardson Dopp, PA-C    10/21/2021 4:51 PM

## 2021-10-21 NOTE — Telephone Encounter (Signed)
   Pre-operative Risk Assessment    Patient Name: Scott [name].  DOB: [birth year] MRN: [id number]      Request for Surgical Clearance    Procedure:   RIGHT KNEE ARTHROSCOPY   Date of Surgery:  Clearance 11/08/21                                 Surgeon:  DR. Collier Salina DALLDORF Surgeon's Group or Practice Name:  [facility] Phone number:  [phone] Fax number:  [phone] ATTN: REBECCA LANG   Type of Clearance Requested:   - Medical  - Pharmacy:  Hold Apixaban (Eliquis)     Type of Anesthesia:   CHOICE   Additional requests/questions:    Jiles Prows   10/21/2021, 4:32 PM

## 2021-10-22 ENCOUNTER — Telehealth: Payer: Self-pay | Admitting: *Deleted

## 2021-10-22 NOTE — Telephone Encounter (Signed)
   Name: Scott [name].  DOB: March 23, 1955  MRN: [id number]  Primary Cardiologist: None   Preoperative team, please contact this patient and set up a phone call appointment for further preoperative risk assessment. Please obtain consent and complete medication review. Thank you for your help.  I confirm that guidance regarding antiplatelet and oral anticoagulation therapy has been completed and, if necessary, noted below. -See notes from [place], PA-C 10/22/2021, 1:15 PM [place]

## 2021-10-22 NOTE — Telephone Encounter (Signed)
Pt agreeable to plan of care for tele pre op appt 11/01/21 @ 9 am. Med rec and consent are done.

## 2021-10-22 NOTE — Telephone Encounter (Signed)
Patient with diagnosis of atrial fibrillation on Eliquis for anticoagulation.    Procedure: Right Knee Arthroscopy Date of procedure: 11/08/21  CHA2DS2-VASc Score = 4   This indicates a 4.8% annual risk of stroke. The patient's score is based upon: CHF History: 1 HTN History: 1 Diabetes History: 0 Stroke History: 0 Vascular Disease History: 1 Age Score: 1 Gender Score: 0    CrCl 42 mL/min (Scr 1.87 10/03/21, using adjusted BW)   Per office protocol, patient can hold Eliquis for 3 days prior to procedure.   Patient will not need bridging with Lovenox (enoxaparin) around procedure.  **This guidance is not considered finalized until pre-operative APP has relayed final recommendations.**

## 2021-10-22 NOTE — Telephone Encounter (Signed)
Pt agreeable to plan of care for tele pre op appt 11/01/21 @ 9 am. Med rec and consent are done.     Patient Consent for Virtual Visit        Quincy Boy. has provided verbal consent on 10/22/2021 for a virtual visit (video or telephone).   CONSENT FOR VIRTUAL VISIT FOR:  [name]  By participating in this virtual visit I agree to the following:  I hereby voluntarily request, consent and authorize [facility] and its employed or contracted physicians, physician assistants, nurse practitioners or other licensed health care professionals (the Practitioner), to provide me with telemedicine health care services (the "Services") as deemed necessary by the treating Practitioner. I acknowledge and consent to receive the Services by the Practitioner via telemedicine. I understand that the telemedicine visit will involve communicating with the Practitioner through live audiovisual communication technology and the disclosure of certain medical information by electronic transmission. I acknowledge that I have been given the opportunity to request an in-person assessment or other available alternative prior to the telemedicine visit and am voluntarily participating in the telemedicine visit.  I understand that I have the right to withhold or withdraw my consent to the use of telemedicine in the course of my care at any time, without affecting my right to future care or treatment, and that the Practitioner or I may terminate the telemedicine visit at any time. I understand that I have the right to inspect all information obtained and/or recorded in the course of the telemedicine visit and may receive copies of available information for a reasonable fee.  I understand that some of the potential risks of receiving the Services via telemedicine include:  Delay or interruption in medical evaluation due to technological equipment failure or disruption; Information transmitted may not be  sufficient (e.g. poor resolution of images) to allow for appropriate medical decision making by the Practitioner; and/or  In rare instances, security protocols could fail, causing a breach of personal health information.  Furthermore, I acknowledge that it is my responsibility to provide information about my medical history, conditions and care that is complete and accurate to the best of my ability. I acknowledge that Practitioner's advice, recommendations, and/or decision may be based on factors not within their control, such as incomplete or inaccurate data provided by me or distortions of diagnostic images or specimens that may result from electronic transmissions. I understand that the practice of medicine is not an exact science and that Practitioner makes no warranties or guarantees regarding treatment outcomes. I acknowledge that a copy of this consent can be made available to me via my patient portal ([facility]), or I can request a printed copy by calling the office of [facility].    I understand that my insurance will be billed for this visit.   I have read or had this consent read to me. I understand the contents of this consent, which adequately explains the benefits and risks of the Services being provided via telemedicine.  I have been provided ample opportunity to ask questions regarding this consent and the Services and have had my questions answered to my satisfaction. I give my informed consent for the services to be provided through the use of telemedicine in my medical care

## 2021-10-23 ENCOUNTER — Other Ambulatory Visit: Payer: Self-pay | Admitting: Internal Medicine

## 2021-10-23 NOTE — Telephone Encounter (Signed)
Please refill as per office routine med refill policy (all routine meds to be refilled for 3 mo or monthly (per pt preference) up to one year from last visit, then month to month grace period for 3 mo, then further med refills will have to be denied) ? ?

## 2021-10-29 ENCOUNTER — Telehealth: Payer: Self-pay | Admitting: Internal Medicine

## 2021-10-29 NOTE — Telephone Encounter (Signed)
Went through completed faxes for that day and found the form but the medical notes were not attached. Reached out to [facility] ad left a message for a call back.

## 2021-10-29 NOTE — Telephone Encounter (Signed)
They need to know if you received a fax on 8/28 for a surgical clearance - if so when it will be ready or sent back  - Fax back to [phone]

## 2021-10-30 NOTE — Telephone Encounter (Signed)
[facility] called back and said that they need you to fax the form to:  [phone] - please put to Rebecca's attention

## 2021-10-31 NOTE — Telephone Encounter (Signed)
Form re-faxed with medical notes attached.

## 2021-11-01 ENCOUNTER — Ambulatory Visit: Payer: Medicaid Other | Attending: Cardiovascular Disease | Admitting: Physician Assistant

## 2021-11-01 DIAGNOSIS — Z01810 Encounter for preprocedural cardiovascular examination: Secondary | ICD-10-CM | POA: Diagnosis not present

## 2021-11-01 NOTE — Progress Notes (Signed)
Virtual Visit via Telephone Note   Because of Scott Fritchman [name] Jr.'s co-morbid illnesses, he is at least at moderate risk for complications without adequate follow up.  This format is felt to be most appropriate for this patient at this time.  The patient did not have access to video technology/had technical difficulties with video requiring transitioning to audio format only (telephone).  All issues noted in this document were discussed and addressed.  No physical exam could be performed with this format.  Please refer to the patient's chart for his consent to telehealth for Scott [name].  Evaluation Performed:  Preoperative cardiovascular risk assessment _____________   Date:  11/01/2021   Patient ID:  Scott Hones., DOB 01-31-1956, MRN [id number] Patient Location:  Home Provider location:   Office  Primary Care Provider:  [name], MD Primary Cardiologist:  None  Chief Complaint / Patient Profile   66 y.o. y/o male with a h/o PAF, CHF, severe sleep apnea and CAD who is pending right knee arthroscopy and presents today for telephonic preoperative cardiovascular risk assessment.  Past Medical History    Past Medical History:  Diagnosis Date   ALLERGIC RHINITIS 01/14/2007   Allergy    ANXIETY 09/28/2006   Asbestos exposure    Atrial fibrillation ([place]) 09/28/2006   s/p afib ablation x 2   CHF (congestive heart failure) ([facility])    CKD (chronic kidney disease), stage III ([place])    patient denies   COPD (chronic obstructive pulmonary disease) ([place])    Coronary artery disease 5/73/2202   Diastolic dysfunction 06/27/2704   Dyspnea    W/ EXERTION    Dysrhythmia    AFIB      First degree AV block 10/12/2018   Noted on EKG   Gallstones    Hyperglycemia    HYPERLIPIDEMIA 09/28/2006   HYPERTENSION 09/28/2006   HYPOTHYROIDISM, ACQUIRED NEC 09/28/2006   pt denies and doesn't know   Long term current use of anticoagulant 04/08/2010   Obese    OBSTRUCTIVE SLEEP APNEA  01/06/2008   NOT USING     Right knee DJD 07/09/2010   S/P CABG x 1 11/26/2011   Right internal mammary artery to right coronary artery   S/P Maze operation for atrial fibrillation 11/26/2011   complete biatrial lesion set with clipping of LA appendage   Sleep apnea    Past Surgical History:  Procedure Laterality Date   ANTERIOR CERVICAL DECOMP/DISCECTOMY FUSION N/A 06/20/2016   Procedure: Anterior Cervical Discectomy Fusion - Cervical five-Cervical six - Cervical six-Cervical seven - Cervicla seven- Thoracic one;  Surgeon: Earnie Larsson, MD;  Location: [facility];  Service: Neurosurgery;  Laterality: N/A;   CARDIOVERSION     X4    CORONARY ARTERY BYPASS GRAFT  11/26/2011   Procedure: CORONARY ARTERY BYPASS GRAFTING (CABG);  Surgeon: Rexene Alberts, MD;  Location: [facility];  Service: Open Heart Surgery;  Laterality: N/A;  coronary artery bypass on pump times one utilizing the right internal mammary artery, transesophageal echocardiogram    HAND SURGERY Left    LEFT HEART CATHETERIZATION WITH CORONARY ANGIOGRAM N/A 11/24/2011   Procedure: LEFT HEART CATHETERIZATION WITH CORONARY ANGIOGRAM;  Surgeon: Peter M Martinique, MD;  Location: [facility] CATH LAB;  Service: Cardiovascular;  Laterality: N/A;   LOOP RECORDER IMPLANT N/A 07/06/2012   Procedure: LOOP RECORDER IMPLANT;  Surgeon: Thompson Grayer, MD;  Location: [facility] C[facility] LAB;  Service: Cardiovascular;  Laterality: N/A;   MAZE  11/26/2011   Procedure: MAZE;  Surgeon: Rexene Alberts, MD;  Location: [facility];  Service: Open Heart Surgery;  Laterality: N/A;  Cryomaze    Radiofrequency Ablation for atrial fibrillation  12/30/07 and 06/05/09   afib ablation x 2 by JA   Radiofrequency ablation for atrial flutter  2005   CTI   TOTAL KNEE ARTHROPLASTY Right 04/05/2019   Procedure: RIGHT TOTAL KNEE ARTHROPLASTY;  Surgeon: Melrose Nakayama, MD;  Location: [facility];  Service: Orthopedics;  Laterality: Right;    Allergies  Allergies  Allergen Reactions   Zolpidem Tartrate Other (See  Comments)    Extremely drowsy the next day    History of Present Illness    Scott Evetts. is a 66 y.o. male who presents via audio/video conferencing for a telehealth visit today.  Pt was last seen in cardiology clinic on 06/17/2021 by Fransico Him, MD.  At that time Scott Hones. was doing well.  The patient is now pending procedure as outlined above. Since his last visit, he has been doing really well without any cardiovascular issues.  He does have some shortness of breath in the heat but otherwise is okay.  Denies chest pain.  He has 2 [place] dogs [name] and [name] which she walks every day with no issues.  He also takes him to the dog park on the weekends.  He has no issues with any of his indoor outdoor housework.  Because of this he scored a 5.62 on the DASI.  This exceeds the 4 METS minimum requirement.  Per office protocol, patient can hold Eliquis for 3 days prior to procedure.   Patient will not need bridging with Lovenox (enoxaparin) around procedure.  Home Medications    Prior to Admission medications   Medication Sig Start Date End Date Taking? Authorizing Provider  acetaminophen (TYLENOL) 500 MG tablet Take 1,000 mg by mouth every 6 (six) hours as needed for mild pain.     [provider]  albuterol (VENTOLIN HFA) 108 (90 Base) MCG/ACT inhaler TAKE 2 PUFFS BY MOUTH EVERY 6 HOURS AS NEEDED FOR WHEEZE OR SHORTNESS OF BREATH 09/08/21   [name], MD  ALPRAZolam Duanne Moron) 0.5 MG tablet TAKE 1 TABLET BY MOUTH TWICE A DAY AS NEEDED FOR ANXIETY 10/03/21   [name], MD  amLODipine (NORVASC) 5 MG tablet Take 1 tablet (5 mg total) by mouth daily. 10/03/21 10/03/22  [name], MD  apixaban (ELIQUIS) 5 MG TABS tablet Take 1 tablet (5 mg total) by mouth 2 (two) times daily. 10/03/21   [name], MD  budesonide-formoterol [facility]) 160-4.5 MCG/ACT inhaler TAKE 2 PUFFS BY MOUTH TWICE A DAY 02/07/21   [name], MD  cholecalciferol (VITAMIN D) 1000 units  tablet Take 1,000 Units by mouth daily.    [provider]  co-enzyme Q-10 30 MG capsule Take 30 mg by mouth daily.    [provider]  cyclobenzaprine (FLEXERIL) 5 MG tablet Take 1 tablet (5 mg total) by mouth 3 (three) times daily as needed for muscle spasms. 01/01/21   [name], MD  docusate sodium (COLACE) 100 MG capsule Take 200 mg by mouth daily as needed for mild constipation.     [provider]  furosemide (LASIX) 20 MG tablet TAKE 1 TABLET BY MOUTH EVERY DAY AS NEEDED 07/04/21   Evans Lance, MD  gabapentin (NEURONTIN) 100 MG capsule TAKE 1 CAPSULE BY MOUTH THREE TIMES A DAY 10/03/21   [name], MD  hydrochlorothiazide (MICROZIDE) 12.5 MG capsule Take 1 capsule (12.5 mg total) by mouth daily. 10/03/21   [name], MD  lovastatin (MEVACOR) 40 MG tablet Take 2 tablets (80 mg total) by mouth at bedtime. 10/23/21   [name], MD  metoprolol succinate (TOPROL-XL) 50 MG 24 hr tablet TAKE 1 AND 1/2 TABLETS DAILY BY MOUTH 10/03/21   [name], MD  Multiple Vitamin (MULTIVITAMIN WITH MINERALS) TABS tablet Take 1 tablet by mouth daily.     [provider]  olmesartan (BENICAR) 40 MG tablet Take 1 tablet (40 mg total) by mouth daily. 10/03/21   [name], MD  spironolactone (ALDACTONE) 25 MG tablet TAKE 0.5 TABLETS BY MOUTH AT BEDTIME. 10/03/21   [name], MD  temazepam (RESTORIL) 30 MG capsule Take 1 capsule (30 mg total) by mouth at bedtime as needed. for sleep 10/03/21   [name], MD  traMADol (ULTRAM) 50 MG tablet Take 1 tablet (50 mg total) by mouth every 6 (six) hours as needed. 10/03/21   [name], MD    Physical Exam    Vital Signs:  Scott Hones. does not have vital signs available for review today.  Given telephonic nature of communication, physical exam is limited. AAOx3. NAD. Normal affect.  Speech and respirations are unlabored.  Accessory Clinical Findings    None  Assessment & Plan    1.   Preoperative Cardiovascular Risk Assessment:  [name] perioperative risk of a major cardiac event is 6.6% according to the Revised Cardiac Risk Index (RCRI).  Therefore, he is at high risk for perioperative complications.   His functional capacity is good at 5.62 METs according to the [facility] Activity Status Index (DASI). Recommendations: According to ACC/AHA guidelines, no further cardiovascular testing needed.  The patient may proceed to surgery at acceptable risk.   Antiplatelet and/or Anticoagulation Recommendations:  Eliquis (Apixaban) can be held for 3 days prior to surgery.  Please resume post op when felt to be safe.     A copy of this note will be routed to requesting surgeon.  Time:   Today, I have spent 15 minutes with the patient with telehealth technology discussing medical history, symptoms, and management plan.     Elgie Collard, PA-C  11/01/2021, 8:15 AM

## 2021-11-14 ENCOUNTER — Ambulatory Visit (INDEPENDENT_AMBULATORY_CARE_PROVIDER_SITE_OTHER): Payer: PPO | Admitting: Internal Medicine

## 2021-11-14 VITALS — BP 118/62 | HR 93 | Temp 97.9°F | Ht 66.0 in | Wt 212.0 lb

## 2021-11-14 DIAGNOSIS — Z23 Encounter for immunization: Secondary | ICD-10-CM

## 2021-11-14 DIAGNOSIS — R21 Rash and other nonspecific skin eruption: Secondary | ICD-10-CM | POA: Diagnosis not present

## 2021-11-14 DIAGNOSIS — R590 Localized enlarged lymph nodes: Secondary | ICD-10-CM

## 2021-11-14 DIAGNOSIS — R739 Hyperglycemia, unspecified: Secondary | ICD-10-CM

## 2021-11-14 DIAGNOSIS — I10 Essential (primary) hypertension: Secondary | ICD-10-CM

## 2021-11-14 MED ORDER — CLOTRIMAZOLE-BETAMETHASONE 1-0.05 % EX CREA: 1.0000 | TOPICAL_CREAM | Freq: Every day | CUTANEOUS | 1 refills | Status: AC

## 2021-11-14 MED ORDER — DOXYCYCLINE HYCLATE 100 MG PO TABS: 100.0000 mg | ORAL_TABLET | Freq: Two times a day (BID) | ORAL | 0 refills | Status: DC

## 2021-11-14 NOTE — Patient Instructions (Signed)
Please take all new medication as prescribed - the antibiotic, and cream  Please call if this is not working well, as we could consider an oral anti-fungal pill  Please continue all other medications as before, and refills have been done if requested.  Please have the pharmacy call with any other refills you may need.  Please continue your efforts at being more active, low cholesterol diet, and weight control.  Please keep your appointments with your specialists as you may have planned

## 2021-11-14 NOTE — Progress Notes (Signed)
Patient ID: Scott [name]., male   DOB: May 07, 1955, 66 y.o.   MRN: [id number]        Chief Complaint: follow up rash, left axillary tender lumps, right knee djd, htn, hld       HPI:  Scott [name]. is a 66 y.o. male here overall doing ok,  Pt denies chest pain, increased sob or doe, wheezing, orthopnea, PND, increased LE swelling, palpitations, dizziness or syncope.   Pt denies polydipsia, polyuria, or new focal neuro s/s.   Has end stage DJD right knee, for TKR next mo per ortho Dr Rhona Raider.  Also has itfchy nontender large erythem rash in half moon fashion starting at base of neck involving bialteral upper chest, approx 8 cm in diameter.  Also has c/o tender lumps to left axilla, not clear if related, but denies other lumps, fever, chills,.   Pt denies fever, wt loss, night sweats, loss of appetite, or other constitutional symptoms   Due for flu shot       Wt Readings from Last 3 Encounters:  11/14/21 212 lb (96.2 kg)  10/03/21 210 lb 6.4 oz (95.4 kg)  06/17/21 207 lb (93.9 kg)   BP Readings from Last 3 Encounters:  11/14/21 118/62  10/03/21 114/60  06/17/21 102/60         Past Medical History:  Diagnosis Date   ALLERGIC RHINITIS 01/14/2007   Allergy    ANXIETY 09/28/2006   Asbestos exposure    Atrial fibrillation ([place]) 09/28/2006   s/p afib ablation x 2   CHF (congestive heart failure) ([place])    CKD (chronic kidney disease), stage III ([place])    patient denies   COPD (chronic obstructive pulmonary disease) ([place])    Coronary artery disease 5/78/4696   Diastolic dysfunction 04/04/5282   Dyspnea    W/ EXERTION    Dysrhythmia    AFIB      First degree AV block 10/12/2018   Noted on EKG   Gallstones    Hyperglycemia    HYPERLIPIDEMIA 09/28/2006   HYPERTENSION 09/28/2006   HYPOTHYROIDISM, ACQUIRED NEC 09/28/2006   pt denies and doesn't know   Long term current use of anticoagulant 04/08/2010   Obese    OBSTRUCTIVE SLEEP APNEA 01/06/2008   NOT USING     Right knee DJD 07/09/2010    S/P CABG x 1 11/26/2011   Right internal mammary artery to right coronary artery   S/P Maze operation for atrial fibrillation 11/26/2011   complete biatrial lesion set with clipping of LA appendage   Sleep apnea    Past Surgical History:  Procedure Laterality Date   ANTERIOR CERVICAL DECOMP/DISCECTOMY FUSION N/A 06/20/2016   Procedure: Anterior Cervical Discectomy Fusion - Cervical five-Cervical six - Cervical six-Cervical seven - Cervicla seven- Thoracic one;  Surgeon: Earnie Larsson, MD;  Location: [place];  Service: Neurosurgery;  Laterality: N/A;   CARDIOVERSION     X4    CORONARY ARTERY BYPASS GRAFT  11/26/2011   Procedure: CORONARY ARTERY BYPASS GRAFTING (CABG);  Surgeon: Rexene Alberts, MD;  Location: [place];  Service: Open Heart Surgery;  Laterality: N/A;  coronary artery bypass on pump times one utilizing the right internal mammary artery, transesophageal echocardiogram    HAND SURGERY Left    LEFT HEART CATHETERIZATION WITH CORONARY ANGIOGRAM N/A 11/24/2011   Procedure: LEFT HEART CATHETERIZATION WITH CORONARY ANGIOGRAM;  Surgeon: Peter M Martinique, MD;  Location: [facility] C[facility] LAB;  Service: Cardiovascular;  Laterality: N/A;   LOOP RECORDER  IMPLANT N/A 07/06/2012   Procedure: LOOP RECORDER IMPLANT;  Surgeon: Thompson Grayer, MD;  Location: [facility] CATH LAB;  Service: Cardiovascular;  Laterality: N/A;   MAZE  11/26/2011   Procedure: MAZE;  Surgeon: Rexene Alberts, MD;  Location: [place];  Service: Open Heart Surgery;  Laterality: N/A;  [place]    Radiofrequency Ablation for atrial fibrillation  12/30/07 and 06/05/09   afib ablation x 2 by JA   Radiofrequency ablation for atrial flutter  2005   CTI   TOTAL KNEE ARTHROPLASTY Right 04/05/2019   Procedure: RIGHT TOTAL KNEE ARTHROPLASTY;  Surgeon: Melrose Nakayama, MD;  Location: [facility];  Service: Orthopedics;  Laterality: Right;    reports that he quit smoking about 26 years ago. His smoking use included cigarettes. He has a 39.00 pack-year smoking history. He  has never used smokeless tobacco. He reports that he does not drink alcohol and does not use drugs. family history includes Atrial fibrillation in his mother; Cancer in his paternal grandmother; Dementia in his father and paternal grandfather; Hypertension in his father; Stroke in his mother. Allergies  Allergen Reactions   Zolpidem Tartrate Other (See Comments)    Extremely drowsy the next day   Current Outpatient Medications on File Prior to Visit  Medication Sig Dispense Refill   acetaminophen (TYLENOL) 500 MG tablet Take 1,000 mg by mouth every 6 (six) hours as needed for mild pain.      albuterol (VENTOLIN HFA) 108 (90 Base) MCG/ACT inhaler TAKE 2 PUFFS BY MOUTH EVERY 6 HOURS AS NEEDED FOR WHEEZE OR SHORTNESS OF BREATH 8.5 each 5   ALPRAZolam (XANAX) 0.5 MG tablet TAKE 1 TABLET BY MOUTH TWICE A DAY AS NEEDED FOR ANXIETY 60 tablet 2   amLODipine (NORVASC) 5 MG tablet Take 1 tablet (5 mg total) by mouth daily. 90 tablet 3   apixaban (ELIQUIS) 5 MG TABS tablet Take 1 tablet (5 mg total) by mouth 2 (two) times daily. 180 tablet 3   budesonide-formoterol (SYMBICORT) 160-4.5 MCG/ACT inhaler TAKE 2 PUFFS BY MOUTH TWICE A DAY 30.6 each 4   cholecalciferol (VITAMIN D) 1000 units tablet Take 1,000 Units by mouth daily.     co-enzyme Q-10 30 MG capsule Take 30 mg by mouth daily.     cyclobenzaprine (FLEXERIL) 5 MG tablet Take 1 tablet (5 mg total) by mouth 3 (three) times daily as needed for muscle spasms. 40 tablet 1   docusate sodium (COLACE) 100 MG capsule Take 200 mg by mouth daily as needed for mild constipation.      furosemide (LASIX) 20 MG tablet TAKE 1 TABLET BY MOUTH EVERY DAY AS NEEDED 15 tablet 0   gabapentin (NEURONTIN) 100 MG capsule TAKE 1 CAPSULE BY MOUTH THREE TIMES A DAY 270 capsule 3   hydrochlorothiazide (MICROZIDE) 12.5 MG capsule Take 1 capsule (12.5 mg total) by mouth daily. 90 capsule 3   lovastatin (MEVACOR) 40 MG tablet Take 2 tablets (80 mg total) by mouth at bedtime. 60  tablet 3   metoprolol succinate (TOPROL-XL) 50 MG 24 hr tablet TAKE 1 AND 1/2 TABLETS DAILY BY MOUTH 135 tablet 3   Multiple Vitamin (MULTIVITAMIN WITH MINERALS) TABS tablet Take 1 tablet by mouth daily.      olmesartan (BENICAR) 40 MG tablet Take 1 tablet (40 mg total) by mouth daily. 90 tablet 3   spironolactone (ALDACTONE) 25 MG tablet TAKE 0.5 TABLETS BY MOUTH AT BEDTIME. 45 tablet 3   temazepam (RESTORIL) 30 MG capsule Take 1 capsule (30  mg total) by mouth at bedtime as needed. for sleep 90 capsule 1   traMADol (ULTRAM) 50 MG tablet Take 1 tablet (50 mg total) by mouth every 6 (six) hours as needed. 120 tablet 5   No current facility-administered medications on file prior to visit.        ROS:  All others reviewed and negative.  Objective        PE:  BP 118/62 (BP Location: Left Arm, Patient Position: Sitting, Cuff Size: Large)   Pulse 93   Temp 97.9 F (36.6 C) (Oral)   Ht '5\' 6"'$  (1.676 m)   Wt 212 lb (96.2 kg)   SpO2 97%   BMI 34.22 kg/m                 Constitutional: Pt appears in NAD               HENT: Head: NCAT.                Right Ear: External ear normal.                 Left Ear: External ear normal.                Eyes: . Pupils are equal, round, and reactive to light. Conjunctivae and EOM are normal               Nose: without d/c or deformity               Neck: Neck supple. Gross normal ROM               Cardiovascular: Normal rate and regular rhythm.                 Pulmonary/Chest: Effort normal and breath sounds without rales or wheezing.                Abd:  Soft, NT, ND, + BS, no organomegaly               Neurological: Pt is alert. At baseline orientation, motor grossly intact               Skin:  LE edema - none, but has itfchy nontender large erythem rash in half moon fashion starting at base of neck involving bialteral upper chest, approx 8 cm in diameter.  ; also several tender lumps to left axilla without overlying skin change or drainage.no other LA  noted               Psychiatric: Pt behavior is normal without agitation   Micro: none  Cardiac tracings I have personally interpreted today:  none  Pertinent Radiological findings (summarize): none   Lab Results  Component Value Date   WBC 7.2 10/03/2021   HGB 14.6 10/03/2021   HCT 43.1 10/03/2021   PLT 187.0 10/03/2021   GLUCOSE 103 (H) 10/03/2021   CHOL 122 10/03/2021   TRIG 79.0 10/03/2021   HDL 39.10 10/03/2021   LDLCALC 67 10/03/2021   ALT 21 10/03/2021   AST 21 10/03/2021   NA 142 10/03/2021   K 4.3 10/03/2021   CL 103 10/03/2021   CREATININE 1.87 (H) 10/03/2021   BUN 33 (H) 10/03/2021   CO2 28 10/03/2021   TSH 2.85 10/03/2021   PSA 0.84 10/03/2021   INR 1.3 (H) 01/01/2021   HGBA1C 5.6 10/03/2021   MICROALBUR <0.7 10/03/2021   Assessment/Plan:  [name] [name] Jr. is a 66  y.o. Dema Severin or Caucasian [1] male with  has a past medical history of ALLERGIC RHINITIS (01/14/2007), Allergy, ANXIETY (09/28/2006), Asbestos exposure, Atrial fibrillation ([place]) (09/28/2006), CHF (congestive heart failure) ([place]), CKD (chronic kidney disease), stage III ([place]), COPD (chronic obstructive pulmonary disease) ([place]), Coronary artery disease (2/44/9753), Diastolic dysfunction (0/0/5110), Dyspnea, Dysrhythmia, First degree AV block (10/12/2018), Gallstones, Hyperglycemia, HYPERLIPIDEMIA (09/28/2006), HYPERTENSION (09/28/2006), HYPOTHYROIDISM, ACQUIRED NEC (09/28/2006), Long term current use of anticoagulant (04/08/2010), Obese, OBSTRUCTIVE SLEEP APNEA (01/06/2008), Right knee DJD (07/09/2010), S/P CABG x 1 (11/26/2011), S/P Maze operation for atrial fibrillation (11/26/2011), and Sleep apnea.  Axillary lymphadenopathy Likely infectious related - for doxy course,  to f/u any worsening symptoms or concerns  Rash ? Fungal, doubt allergic - for lotrisone cr prn,  to f/u any worsening symptoms or concerns  Essential hypertension BP Readings from Last 3 Encounters:  11/14/21 118/62  10/03/21 114/60   06/17/21 102/60   Stable, pt to continue medical treatment hct 12.5 qd, toprol xl 75 qd, benicar 40 qd  Followup: Return in about 5 months (around 04/07/2022).  Cathlean Cower, MD 11/16/2021 9:09 PM [facility]

## 2021-11-16 ENCOUNTER — Encounter: Payer: Self-pay | Admitting: Internal Medicine

## 2021-11-16 NOTE — Assessment & Plan Note (Signed)
Likely infectious related - for doxy course,  to f/u any worsening symptoms or concerns

## 2021-11-16 NOTE — Assessment & Plan Note (Signed)
?   Fungal, doubt allergic - for lotrisone cr prn,  to f/u any worsening symptoms or concerns

## 2021-11-16 NOTE — Assessment & Plan Note (Signed)
BP Readings from Last 3 Encounters:  11/14/21 118/62  10/03/21 114/60  06/17/21 102/60   Stable, pt to continue medical treatment hct 12.5 qd, toprol xl 75 qd, benicar 40 qd

## 2021-11-18 ENCOUNTER — Other Ambulatory Visit: Payer: Self-pay | Admitting: Orthopaedic Surgery

## 2021-11-25 DIAGNOSIS — G4733 Obstructive sleep apnea (adult) (pediatric): Secondary | ICD-10-CM | POA: Diagnosis not present

## 2021-11-26 NOTE — Patient Instructions (Signed)
SURGICAL WAITING ROOM VISITATION Patients having surgery or a procedure may have no more than 2 support people in the waiting area - these visitors may rotate in the visitor waiting room.   Children under the age of 67 must have an adult with them who is not the patient. If the patient needs to stay at the hospital during part of their recovery, the visitor guidelines for inpatient rooms apply.  PRE-OP VISITATION  Pre-op nurse will coordinate an appropriate time for 1 support person to accompany the patient in pre-op.  This support person may not rotate.  This visitor will be contacted when the time is appropriate for the visitor to come back in the pre-op area.  Please refer to the [facility] website for the visitor guidelines for Inpatients (after your surgery is over and you are in a regular room).  You are not required to quarantine at this time prior to your surgery. However, you must do this: Hand Hygiene often Do NOT share personal items Notify your provider if you are in close contact with someone who has COVID or you develop fever 100.4 or greater, new onset of sneezing, cough, sore throat, shortness of breath or body aches.   If you received a COVID test during your pre-op visit  it is requested that you wear a mask when out in public, stay away from anyone that may not be feeling well and notify your surgeon if you develop symptoms. If you test positive for Covid or have been in contact with anyone that has tested positive in the last 10 days please notify you surgeon.       Your procedure is scheduled on:  Tuesday December 10, 2021  Report to [facility] Main Entrance.  Report to admitting at:   2:45 PM  +++++Call this number if you have any questions or problems the morning of surgery [phone]  Do not eat food :After Midnight the night prior to your surgery/procedure.  After Midnight you may have the following liquids until   2:00 PM DAY OF SURGERY  Clear  Liquid Diet Water Black Coffee (sugar ok, NO MILK/CREAM OR CREAMERS)  Tea (sugar ok, NO MILK/CREAM OR CREAMERS) regular and decaf                             Plain Jell-O  with no fruit (NO RED)                                           Fruit ices (not with fruit pulp, NO RED)                                     Popsicles (NO RED)                                                                  Juice: apple, WHITE grape, WHITE cranberry Sports drinks like Gatorade or Powerade (NO RED)  The day of surgery:  Drink ONE (1) Pre-Surgery Clear Ensure  at   2:00 PM the morning of surgery. Drink in one sitting. Do not sip.  This drink was given to you during your hospital pre-op appointment visit. Nothing else to drink after completing the Pre-Surgery Clear Ensure or G2  No candy, chewing gum or throat lozenges.    FOLLOW ANY ADDITIONAL PRE OP INSTRUCTIONS YOU RECEIVED FROM YOUR SURGEON'S OFFICE!!!   Oral Hygiene is also important to reduce your risk of infection.        Remember - BRUSH YOUR TEETH THE MORNING OF SURGERY WITH YOUR REGULAR TOOTHPASTE   Take ONLY these medicines the morning of surgery with A SIP OF WATER: alprazolam (Xanax), symbicort, Gabapentin, metoprolol, amlodipine. If needed you may take Tramadol or Tylenol for pain and you may use your Albuterol Inhaler.     If You have been diagnosed with Sleep Apnea - Bring CPAP mask and tubing day of surgery. We will provide you with a CPAP machine on the day of your surgery.                   You may not have any metal on your body including  jewelry, and body piercing  Do not wear  lotions, powders, cologne, or deodorant   Men may shave face and neck.  Contacts, Hearing Aids, dentures or bridgework may not be worn into surgery.   DO NOT [place]. PHARMACY WILL DISPENSE MEDICATIONS LISTED ON YOUR MEDICATION LIST TO YOU DURING YOUR ADMISSION [place]!   Patients  discharged on the day of surgery will not be allowed to drive home.  Someone NEEDS to stay with you for the first 24 hours after anesthesia.  Special Instructions: Bring a copy of your healthcare power of attorney and living will documents the day of surgery, if you wish to have them scanned into your [facility] Medical Records- EPIC  Please read over the following fact sheets you were given: IF YOU HAVE QUESTIONS ABOUT YOUR PRE-OP INSTRUCTIONS, PLEASE CALL [phone]  ([place])   [place] - Preparing for Surgery Before surgery, you can play an important role.  Because skin is not sterile, your skin needs to be as free of germs as possible.  You can reduce the number of germs on your skin by washing with CHG (chlorahexidine gluconate) soap before surgery.  CHG is an antiseptic cleaner which kills germs and bonds with the skin to continue killing germs even after washing. Please DO NOT use if you have an allergy to CHG or antibacterial soaps.  If your skin becomes reddened/irritated stop using the CHG and inform your nurse when you arrive at Short Stay. Do not shave (including legs and underarms) for at least 48 hours prior to the first CHG shower.  You may shave your face/neck.  Please follow these instructions carefully:  1.  Shower with CHG Soap the night before surgery and the  morning of surgery.  2.  If you choose to wash your hair, wash your hair first as usual with your normal  shampoo.  3.  After you shampoo, rinse your hair and body thoroughly to remove the shampoo.                             4.  Use CHG as you would any other liquid soap.  You can apply chg directly to  the skin and wash.  Gently with a scrungie or clean washcloth.  5.  Apply the CHG Soap to your body ONLY FROM THE NECK DOWN.   Do not use on face/ open                           Wound or open sores. Avoid contact with eyes, ears mouth and genitals (private parts).                       Wash face,  Genitals (private  parts) with your normal soap.             6.  Wash thoroughly, paying special attention to the area where your  surgery  will be performed.  7.  Thoroughly rinse your body with warm water from the neck down.  8.  DO NOT shower/wash with your normal soap after using and rinsing off the CHG Soap.            9.  Pat yourself dry with a clean towel.            10.  Wear clean pajamas.            11.  Place clean sheets on your bed the night of your first shower and do not  sleep with pets.  ON THE DAY OF SURGERY : Do not apply any lotions/deodorants the morning of surgery.  Please wear clean clothes to the hospital/surgery center.    FAILURE TO FOLLOW THESE INSTRUCTIONS MAY RESULT IN THE CANCELLATION OF YOUR SURGERY  PATIENT SIGNATURE_________________________________  NURSE SIGNATURE__________________________________  ________________________________________________________________________        Scott [name]    An incentive spirometer is a tool that can help keep your lungs clear and active. This tool measures how well you are filling your lungs with each breath. Taking long deep breaths may help reverse or decrease the chance of developing breathing (pulmonary) problems (especially infection) following: A long period of time when you are unable to move or be active. BEFORE THE PROCEDURE  If the spirometer includes an indicator to show your best effort, your nurse or respiratory therapist will set it to a desired goal. If possible, sit up straight or lean slightly forward. Try not to slouch. Hold the incentive spirometer in an upright position. INSTRUCTIONS FOR USE  Sit on the edge of your bed if possible, or sit up as far as you can in bed or on a chair. Hold the incentive spirometer in an upright position. Breathe out normally. Place the mouthpiece in your mouth and seal your lips tightly around it. Breathe in slowly and as deeply as possible, raising the piston or the  ball toward the top of the column. Hold your breath for 3-5 seconds or for as long as possible. Allow the piston or ball to fall to the bottom of the column. Remove the mouthpiece from your mouth and breathe out normally. Rest for a few seconds and repeat Steps 1 through 7 at least 10 times every 1-2 hours when you are awake. Take your time and take a few normal breaths between deep breaths. The spirometer may include an indicator to show your best effort. Use the indicator as a goal to work toward during each repetition. After each set of 10 deep breaths, practice coughing to be sure your lungs are clear. If you have an incision (the cut made at the time of  surgery), support your incision when coughing by placing a pillow or rolled up towels firmly against it. Once you are able to get out of bed, walk around indoors and cough well. You may stop using the incentive spirometer when instructed by your caregiver.  RISKS AND COMPLICATIONS Take your time so you do not get dizzy or light-headed. If you are in pain, you may need to take or ask for pain medication before doing incentive spirometry. It is harder to take a deep breath if you are having pain. AFTER USE Rest and breathe slowly and easily. It can be helpful to keep track of a log of your progress. Your caregiver can provide you with a simple table to help with this. If you are using the spirometer at home, follow these instructions: [place] IF:  You are having difficultly using the spirometer. You have trouble using the spirometer as often as instructed. Your pain medication is not giving enough relief while using the spirometer. You develop fever of 100.5 F (38.1 C) or higher.                                                                                                    SEEK IMMEDIATE MEDICAL CARE IF:  You cough up bloody sputum that had not been present before. You develop fever of 102 F (38.9 C) or greater. You develop  worsening pain at or near the incision site. MAKE SURE YOU:  Understand these instructions. Will watch your condition. Will get help right away if you are not doing well or get worse. Document Released: 06/23/2006 Document Revised: 05/05/2011 Document Reviewed: 08/24/2006 [facility] Patient Information 2014 [place], [place].

## 2021-11-26 NOTE — Progress Notes (Signed)
COVID Vaccine received:  '[]'$  No '[x]'$  Yes Date of any COVID positive Test in last 90 days:  PCP - Dr. Cathlean Cower Cardiologist - Dr. Fransico Him Clearance by Nicholes Rough, PA-C on 11-01-21 Telephone note  Chest x-ray - 09-06-2018  Epic EKG -  05-29-2020  Epic Stress Test - 03-06-2017  Epic ECHO - 09-13-2018  Epic Cardiac Cath - 2013  Epic   Pacemaker/ICD device     '[x]'$  N/A Spinal Cord Stimulator:'[x]'$  No '[]'$  Yes      (Remind patient to bring remote DOS) Other Implants: Loop Recorder ([employer] LINQ)   History of Sleep Apnea? '[]'$  No '[x]'$  Yes   Sleep Study Date:  2018 CPAP used?- '[x]'$  No '[]'$  Yes  (Instruct to bring their mask & Tubing)  Does the patient monitor blood sugar? '[]'$  No '[]'$  Yes  '[]'$  N/A Does patient have a [employer] or Dexacom? '[]'$  No '[]'$  Yes   Fasting Blood Sugar Ranges-  Checks Blood Sugar _____ times a day  Blood Thinner Instructions: Eliquis bid Instructions:Hold X 3 days, no Lovenox bridge needed per Tessa Conte-PA-C Last Dose: Friday 12-06-21  ERAS Protocol Ordered: '[]'$  No  '[x]'$  Yes PRE-SURGERY '[x]'$  ENSURE  '[]'$  G2   Comments:   Activity level: Patient can / can not climb a flight of stairs without difficulty;  '[]'$  No CP  '[]'$  No SOB,  but would have ______   Anesthesia review: ACDF (3 Levels- C5-6, C6-7, C7-T1)in 2018,  CABG/MAZE in 2013,CHF, CKD3, COPD, 1 AVB 10-12-2018,  Patient denies shortness of breath, fever, cough and chest pain at PAT appointment.  Patient verbalized understanding and agreement to the Pre-Surgical Instructions that were given to them at this PAT appointment. Patient was also educated of the need to review these PAT instructions again prior to his/her surgery.I reviewed the appropriate phone numbers to call if they have any and questions or concerns.

## 2021-11-28 ENCOUNTER — Encounter (HOSPITAL_COMMUNITY)
Admission: RE | Admit: 2021-11-28 | Discharge: 2021-11-28 | Disposition: A | Discharge status: Home / Self Care | Payer: HMO | Source: Ambulatory Visit | Attending: Orthopaedic Surgery | Admitting: Orthopaedic Surgery

## 2021-11-28 ENCOUNTER — Encounter (HOSPITAL_COMMUNITY): Payer: Self-pay

## 2021-11-28 ENCOUNTER — Other Ambulatory Visit: Payer: Self-pay

## 2021-11-28 VITALS — BP 108/62 | HR 50 | Temp 98.0°F | Resp 16 | Ht 66.0 in | Wt 212.0 lb

## 2021-11-28 DIAGNOSIS — Z01818 Encounter for other preprocedural examination: Secondary | ICD-10-CM | POA: Diagnosis not present

## 2021-11-28 DIAGNOSIS — I498 Other specified cardiac arrhythmias: Secondary | ICD-10-CM | POA: Insufficient documentation

## 2021-11-28 DIAGNOSIS — T8484XA Pain due to internal orthopedic prosthetic devices, implants and grafts, initial encounter: Secondary | ICD-10-CM | POA: Diagnosis not present

## 2021-11-28 DIAGNOSIS — I4510 Unspecified right bundle-branch block: Secondary | ICD-10-CM | POA: Diagnosis not present

## 2021-11-28 DIAGNOSIS — I2510 Atherosclerotic heart disease of native coronary artery without angina pectoris: Secondary | ICD-10-CM | POA: Insufficient documentation

## 2021-11-28 DIAGNOSIS — I130 Hypertensive heart and chronic kidney disease with heart failure and stage 1 through stage 4 chronic kidney disease, or unspecified chronic kidney disease: Secondary | ICD-10-CM | POA: Diagnosis not present

## 2021-11-28 DIAGNOSIS — I10 Essential (primary) hypertension: Secondary | ICD-10-CM

## 2021-11-28 DIAGNOSIS — J449 Chronic obstructive pulmonary disease, unspecified: Secondary | ICD-10-CM | POA: Diagnosis not present

## 2021-11-28 LAB — BASIC METABOLIC PANEL
Anion gap: 9 (ref 5–15)
BUN: 36 mg/dL — ABNORMAL HIGH (ref 8–23)
CO2: 24 mmol/L (ref 22–32)
Calcium: 8.8 mg/dL — ABNORMAL LOW (ref 8.9–10.3)
Chloride: 106 mmol/L (ref 98–111)
Creatinine, Ser: 1.8 mg/dL — ABNORMAL HIGH (ref 0.61–1.24)
GFR, Estimated: 41 mL/min — ABNORMAL LOW (ref >60)
Glucose, Bld: 106 mg/dL — ABNORMAL HIGH (ref 70–99)
Potassium: 4 mmol/L (ref 3.5–5.1)
Sodium: 139 mmol/L (ref 135–145)

## 2021-11-28 LAB — CBC
HCT: 42.7 % (ref 39.0–52.0)
Hemoglobin: 14 g/dL (ref 13.0–17.0)
MCH: 32.7 pg (ref 26.0–34.0)
MCHC: 32.8 g/dL (ref 30.0–36.0)
MCV: 99.8 fL (ref 80.0–100.0)
Platelets: 188 10*3/uL (ref 150–400)
RBC: 4.28 MIL/uL (ref 4.22–5.81)
RDW: 13.1 % (ref 11.5–15.5)
WBC: 7.4 10*3/uL (ref 4.0–10.5)
nRBC: 0 % (ref 0.0–0.2)

## 2021-11-29 ENCOUNTER — Ambulatory Visit: Payer: HMO | Attending: Internal Medicine | Admitting: Internal Medicine

## 2021-11-29 ENCOUNTER — Encounter: Payer: Self-pay | Admitting: Internal Medicine

## 2021-11-29 VITALS — BP 116/64 | HR 63 | Ht 66.0 in | Wt 213.0 lb

## 2021-11-29 DIAGNOSIS — I10 Essential (primary) hypertension: Secondary | ICD-10-CM

## 2021-11-29 DIAGNOSIS — I480 Paroxysmal atrial fibrillation: Secondary | ICD-10-CM

## 2021-11-29 NOTE — Patient Instructions (Signed)

## 2021-11-29 NOTE — Progress Notes (Signed)
HPI [name] returns today for followup. He is a pleasant 66 yo man with multiple medical problems including PAF, HTN, and remote tachy induced CM. In the interim, he has done well. He has gotten back to walking though he is limited by knee pain for which he has undergone replacement.  No chest pain or sob. No edema. His knee is still a little sore and he is pending revision. Allergies  Allergen Reactions   Zolpidem Tartrate Other (See Comments)    Extremely drowsy the next day     Current Outpatient Medications  Medication Sig Dispense Refill   acetaminophen (TYLENOL) 500 MG tablet Take 1,000 mg by mouth every 6 (six) hours as needed for mild pain.      albuterol (VENTOLIN HFA) 108 (90 Base) MCG/ACT inhaler TAKE 2 PUFFS BY MOUTH EVERY 6 HOURS AS NEEDED FOR WHEEZE OR SHORTNESS OF BREATH 8.5 each 5   ALPRAZolam (XANAX) 0.5 MG tablet TAKE 1 TABLET BY MOUTH TWICE A DAY AS NEEDED FOR ANXIETY 60 tablet 2   amLODipine (NORVASC) 5 MG tablet Take 1 tablet (5 mg total) by mouth daily. 90 tablet 3   apixaban (ELIQUIS) 5 MG TABS tablet Take 1 tablet (5 mg total) by mouth 2 (two) times daily. 180 tablet 3   budesonide-formoterol (SYMBICORT) 160-4.5 MCG/ACT inhaler TAKE 2 PUFFS BY MOUTH TWICE A DAY 30.6 each 4   cholecalciferol (VITAMIN D) 1000 units tablet Take 1,000 Units by mouth daily.     clotrimazole-betamethasone (LOTRISONE) cream Apply 1 Application topically daily. 30 g 1   co-enzyme Q-10 30 MG capsule Take 30 mg by mouth daily.     cyclobenzaprine (FLEXERIL) 5 MG tablet Take 1 tablet (5 mg total) by mouth 3 (three) times daily as needed for muscle spasms. 40 tablet 1   docusate sodium (COLACE) 100 MG capsule Take 200 mg by mouth daily as needed for mild constipation.      doxycycline (VIBRA-TABS) 100 MG tablet Take 1 tablet (100 mg total) by mouth 2 (two) times daily. 20 tablet 0   furosemide (LASIX) 20 MG tablet TAKE 1 TABLET BY MOUTH EVERY DAY AS NEEDED (Patient taking differently:  Take 20 mg by mouth daily.) 15 tablet 0   gabapentin (NEURONTIN) 100 MG capsule TAKE 1 CAPSULE BY MOUTH THREE TIMES A DAY 270 capsule 3   hydrochlorothiazide (MICROZIDE) 12.5 MG capsule Take 1 capsule (12.5 mg total) by mouth daily. 90 capsule 3   lovastatin (MEVACOR) 40 MG tablet Take 2 tablets (80 mg total) by mouth at bedtime. 60 tablet 3   metoprolol succinate (TOPROL-XL) 50 MG 24 hr tablet TAKE 1 AND 1/2 TABLETS DAILY BY MOUTH 135 tablet 3   Multiple Vitamin (MULTIVITAMIN WITH MINERALS) TABS tablet Take 1 tablet by mouth daily.      olmesartan (BENICAR) 40 MG tablet Take 1 tablet (40 mg total) by mouth daily. 90 tablet 3   spironolactone (ALDACTONE) 25 MG tablet TAKE 0.5 TABLETS BY MOUTH AT BEDTIME. 45 tablet 3   temazepam (RESTORIL) 30 MG capsule Take 1 capsule (30 mg total) by mouth at bedtime as needed. for sleep (Patient taking differently: Take 30 mg by mouth at bedtime.) 90 capsule 1   traMADol (ULTRAM) 50 MG tablet Take 1 tablet (50 mg total) by mouth every 6 (six) hours as needed. 120 tablet 5   No current facility-administered medications for this visit.     Past Medical History:  Diagnosis Date   ALLERGIC RHINITIS  01/14/2007   Allergy    ANXIETY 09/28/2006   Asbestos exposure    Atrial fibrillation ([place]) 09/28/2006   s/p afib ablation x 2   CHF (congestive heart failure) ([place])    CKD (chronic kidney disease), stage III ([place])    patient denies   COPD (chronic obstructive pulmonary disease) ([place])    Coronary artery disease 7/37/1062   Diastolic dysfunction 08/02/4852   Dyspnea    W/ EXERTION    Dysrhythmia    AFIB      First degree AV block 10/12/2018   Noted on EKG   Gallstones    Hyperglycemia    HYPERLIPIDEMIA 09/28/2006   HYPERTENSION 09/28/2006   HYPOTHYROIDISM, ACQUIRED NEC 09/28/2006   pt denies and doesn't know   Long term current use of anticoagulant 04/08/2010   Obese    OBSTRUCTIVE SLEEP APNEA 01/06/2008   NOT USING     Right knee DJD 07/09/2010   S/P CABG x 1  11/26/2011   Right internal mammary artery to right coronary artery   S/P Maze operation for atrial fibrillation 11/26/2011   complete biatrial lesion set with clipping of LA appendage   Sleep apnea     ROS:   All systems reviewed and negative except as noted in the HPI.   Past Surgical History:  Procedure Laterality Date   ANTERIOR CERVICAL DECOMP/DISCECTOMY FUSION N/A 06/20/2016   Procedure: Anterior Cervical Discectomy Fusion - Cervical five-Cervical six - Cervical six-Cervical seven - Cervicla seven- Thoracic one;  Surgeon: Earnie Larsson, MD;  Location: [facility];  Service: Neurosurgery;  Laterality: N/A;   CARDIOVERSION     X4    CORONARY ARTERY BYPASS GRAFT  11/26/2011   Procedure: CORONARY ARTERY BYPASS GRAFTING (CABG);  Surgeon: Rexene Alberts, MD;  Location: [facility];  Service: Open Heart Surgery;  Laterality: N/A;  coronary artery bypass on pump times one utilizing the right internal mammary artery, transesophageal echocardiogram    HAND SURGERY Left    LEFT HEART CATHETERIZATION WITH CORONARY ANGIOGRAM N/A 11/24/2011   Procedure: LEFT HEART CATHETERIZATION WITH CORONARY ANGIOGRAM;  Surgeon: Peter M Martinique, MD;  Location: [facility] CATH LAB;  Service: Cardiovascular;  Laterality: N/A;   LOOP RECORDER IMPLANT N/A 07/06/2012   Procedure: LOOP RECORDER IMPLANT;  Surgeon: Thompson Grayer, MD;  Location: [facility] CATH LAB;  Service: Cardiovascular;  Laterality: N/A;   MAZE  11/26/2011   Procedure: MAZE;  Surgeon: Rexene Alberts, MD;  Location: [facility];  Service: Open Heart Surgery;  Laterality: N/A;  Cryomaze    Radiofrequency Ablation for atrial fibrillation  12/30/07 and 06/05/09   afib ablation x 2 by JA   Radiofrequency ablation for atrial flutter  2005   CTI   TOTAL KNEE ARTHROPLASTY Right 04/05/2019   Procedure: RIGHT TOTAL KNEE ARTHROPLASTY;  Surgeon: Melrose Nakayama, MD;  [facility];  Service: Orthopedics;  Laterality: Right;     Family History  Problem Relation Age of Onset   Dementia Father     Hypertension Father    Atrial fibrillation Mother    Stroke Mother    Dementia Paternal Grandfather    Cancer Paternal Grandmother        type unknown   Colon cancer Neg Hx    Esophageal cancer Neg Hx    Prostate cancer Neg Hx    Rectal cancer Neg Hx    Stomach cancer Neg Hx      Social History   Socioeconomic History   Marital status: Single  Spouse name: Not on file   Number of children: 0   Years of education: Not on file   Highest education level: Not on file  Occupational History   Occupation: works at the [employer] part-time  Tobacco Use   Smoking status: Former    Packs/day: 1.50    Years: 26.00    Total pack years: 39.00    Types: Cigarettes    Quit date: 04/06/1995    Years since quitting: 26.6   Smokeless tobacco: Never  Vaping Use   Vaping Use: Never used  Substance and Sexual Activity   Alcohol use: No    Alcohol/week: 0.0 standard drinks of alcohol   Drug use: No   Sexual activity: Yes  Other Topics Concern   Not on file  Social History Narrative   Pt lives in [place], [place] with his friend.    Social Determinants of Health   Financial Resource Strain: Low Risk  (02/14/2021)   Overall Financial Resource Strain (CARDIA)    Difficulty of Paying Living Expenses: Not hard at all  Food Insecurity: No Food Insecurity (09/02/2021)   Hunger Vital Sign    Worried About Running Out of Food in the Last Year: Never true    Ran Out of Food in the Last Year: Never true  Transportation Needs: No Transportation Needs (09/02/2021)   PRAPARE - Hydrologist (Medical): No    Lack of Transportation (Non-Medical): No  Physical Activity: Sufficiently Active (04/08/2021)   Exercise Vital Sign    Days of Exercise per Week: 6 days    Minutes of Exercise per Session: 60 min  Stress: No Stress Concern Present (02/14/2021)   [place]    Feeling of Stress : Not at all   Social Connections: Moderately Integrated (02/14/2021)   Social Connection and Isolation Panel [NHANES]    Frequency of Communication with Friends and Family: More than three times a week    Frequency of Social Gatherings with Friends and Family: More than three times a week    Attends Religious Services: 1 to 4 times per year    Active Member of [employer] or Organizations: Yes    Attends Archivist Meetings: 1 to 4 times per year    Marital Status: Never married  Intimate Partner Violence: Not At Risk (02/14/2021)   Humiliation, Afraid, Rape, and Kick questionnaire    Fear of Current or Ex-Partner: No    Emotionally Abused: No    Physically Abused: No    Sexually Abused: No     BP 116/64   Pulse 63   Ht '5\' 6"'$  (1.676 m)   Wt 213 lb (96.6 kg)   SpO2 99%   BMI 34.38 kg/m   Physical Exam:  Well appearing NAD HEENT: Unremarkable Neck:  No JVD, no thyromegally Lymphatics:  No adenopathy Back:  No CVA tenderness Lungs:  Clear with no wheezes HEART:  Regular rate rhythm, no murmurs, no rubs, no clicks Abd:  soft, positive bowel sounds, no organomegally, no rebound, no guarding Ext:  2 plus pulses, no edema, no cyanosis, no clubbing Skin:  No rashes no nodules Neuro:  CN II through XII intact, motor grossly intact  EKG - nsr with IVCD   Assess/Plan:  1. PAF - he appears to be mostly maintaining NSR. He has minimal palpitations. 2. Obesity - he has gained 7 lbs since his last visit. I encouraged him to try and  lose weight. 3. HTN - his bp is well controlled. No change in meds. 4. Chronic diastolic heart failure - his symptoms are class 1. He will continue his current meds. He is encouraged to start walking again and maintain a low sodium diet. 5. Preop eval - he is an acceptable surgical risk. Ok to stop xarelto for 3 day prior to his procedure. Restart when bleeding risk is acceptable.   Carleene Overlie Coady Train,MD

## 2021-12-02 NOTE — Progress Notes (Signed)
Anesthesia Chart Review   Case: [id number] Date/Time: 12/10/21 1424   Procedure: RIGH TKNEE ARTHROSCOPY (Right: Knee)   Anesthesia type: Choice   Pre-op diagnosis: RIGHT KNEE PAINFUL TOTAL KNEE REPLACEMENT   [facility] 06 / [facility]   Surgeons: Melrose Nakayama, MD       DISCUSSION:66 y.o. former smoker with h/o HTN, atrial fibrillaton s/p Maze procedure, CAD, CHF, COPD, sleep apnea, CKD Stage III, painful right knee replacement scheduled for above procedure 12/10/2021 with Dr. Melrose Nakayama.   Pt seen by cardiology 11/29/2021. Per OV note, "Preop eval - he is an acceptable surgical risk. Ok to stop xarelto for 3 day prior to his procedure. Restart when bleeding risk is acceptable."  Anticipate pt can proceed with planned procedure barring acute status change.   VS: BP 108/62 Comment: right arm sitting  Pulse (!) 50   Temp 36.7 C (Oral)   Resp 16   Ht '5\' 6"'$  (1.676 m)   Wt 96.2 kg   SpO2 98%   BMI 34.22 kg/m   PROVIDERS: [name], MD is PCP   Cristopher Peru, MD is Cardiologist  LABS: Labs reviewed: Acceptable for surgery. (all labs ordered are listed, but only abnormal results are displayed)  Labs Reviewed  BASIC METABOLIC PANEL - Abnormal; Notable for the following components:      Result Value   Glucose, Bld 106 (*)    BUN 36 (*)    Creatinine, Ser 1.80 (*)    Calcium 8.8 (*)    GFR, Estimated 41 (*)    All other components within normal limits  CBC     IMAGES:   EKG:   CV: Echo 09/13/2018 1. The left ventricle has mildly reduced systolic function, with an  ejection fraction of 45-50%. The cavity size was normal. There is mildly  increased left ventricular wall thickness. Diastolic function not assessed  - images not acquired.. There is  abnormal septal motion consistent with post-operative status.   2. The right ventricle has moderately reduced systolic function. The  cavity was normal. There is no increase in right ventricular wall  thickness.  Right ventricular systolic pressure could not be assessed.   3. The aortic valve is tricuspid. Aortic valve regurgitation is trivial  by color flow Doppler. No stenosis of the aortic valve.   4. Left atrial size was mildly dilated.   5. When compared to the prior study: 09/2011 - images not available for  side by side comparison.   Myocardial Perfusion 03/06/2017 Nuclear stress EF: 47%. This is a low risk study. The left ventricular ejection fraction is mildly decreased (45-54%). There was no ST segment deviation noted during stress. No T wave inversion was noted during stress.   Low risk stress nuclear study with normal perfusion and mildly reduced left ventricular systolic function. Past Medical History:  Diagnosis Date   ALLERGIC RHINITIS 01/14/2007   Allergy    ANXIETY 09/28/2006   Asbestos exposure    Atrial fibrillation ([place]) 09/28/2006   s/p afib ablation x 2   CHF (congestive heart failure) ([facility])    CKD (chronic kidney disease), stage III ([place])    patient denies   COPD (chronic obstructive pulmonary disease) ([place])    Coronary artery disease 6/38/4665   Diastolic dysfunction 11/02/3568   Dyspnea    W/ EXERTION    Dysrhythmia    AFIB      First degree AV block 10/12/2018   Noted on EKG   Gallstones  Hyperglycemia    HYPERLIPIDEMIA 09/28/2006   HYPERTENSION 09/28/2006   HYPOTHYROIDISM, ACQUIRED NEC 09/28/2006   pt denies and doesn't know   Long term current use of anticoagulant 04/08/2010   Obese    OBSTRUCTIVE SLEEP APNEA 01/06/2008   NOT USING     Right knee DJD 07/09/2010   S/P CABG x 1 11/26/2011   Right internal mammary artery to right coronary artery   S/P Maze operation for atrial fibrillation 11/26/2011   complete biatrial lesion set with clipping of LA appendage   Sleep apnea     Past Surgical History:  Procedure Laterality Date   ANTERIOR CERVICAL DECOMP/DISCECTOMY FUSION N/A 06/20/2016   Procedure: Anterior Cervical Discectomy Fusion - Cervical five-Cervical  six - Cervical six-Cervical seven - Cervicla seven- Thoracic one;  Surgeon: Earnie Larsson, MD;  Location: [facility];  Service: Neurosurgery;  Laterality: N/A;   CARDIOVERSION     X4    CORONARY ARTERY BYPASS GRAFT  11/26/2011   Procedure: CORONARY ARTERY BYPASS GRAFTING (CABG);  Surgeon: Rexene Alberts, MD;  Location: [facility];  Service: Open Heart Surgery;  Laterality: N/A;  coronary artery bypass on pump times one utilizing the right internal mammary artery, transesophageal echocardiogram    HAND SURGERY Left    LEFT HEART CATHETERIZATION WITH CORONARY ANGIOGRAM N/A 11/24/2011   Procedure: LEFT HEART CATHETERIZATION WITH CORONARY ANGIOGRAM;  Surgeon: Peter M Martinique, MD;  [facility] CATH LAB;  Service: Cardiovascular;  Laterality: N/A;   LOOP RECORDER IMPLANT N/A 07/06/2012   Procedure: LOOP RECORDER IMPLANT;  Surgeon: Thompson Grayer, MD;  Location: [facility] CATH LAB;  Service: Cardiovascular;  Laterality: N/A;   MAZE  11/26/2011   Procedure: MAZE;  Surgeon: Rexene Alberts, MD;  Location: [facility];  Service: Open Heart Surgery;  Laterality: N/A;  Cryomaze    Radiofrequency Ablation for atrial fibrillation  12/30/07 and 06/05/09   afib ablation x 2 by JA   Radiofrequency ablation for atrial flutter  2005   CTI   TOTAL KNEE ARTHROPLASTY Right 04/05/2019   Procedure: RIGHT TOTAL KNEE ARTHROPLASTY;  Surgeon: Melrose Nakayama, MD;  [facility];  Service: Orthopedics;  Laterality: Right;    MEDICATIONS:  acetaminophen (TYLENOL) 500 MG tablet   albuterol (VENTOLIN HFA) 108 (90 Base) MCG/ACT inhaler   ALPRAZolam (XANAX) 0.5 MG tablet   amLODipine (NORVASC) 5 MG tablet   apixaban (ELIQUIS) 5 MG TABS tablet   budesonide-formoterol (SYMBICORT) 160-4.5 MCG/ACT inhaler   cholecalciferol (VITAMIN D) 1000 units tablet   clotrimazole-betamethasone (LOTRISONE) cream   co-enzyme Q-10 30 MG capsule   cyclobenzaprine (FLEXERIL) 5 MG tablet   docusate sodium (COLACE) 100 MG capsule   doxycycline (VIBRA-TABS) 100 MG tablet    furosemide (LASIX) 20 MG tablet   gabapentin (NEURONTIN) 100 MG capsule   hydrochlorothiazide (MICROZIDE) 12.5 MG capsule   lovastatin (MEVACOR) 40 MG tablet   metoprolol succinate (TOPROL-XL) 50 MG 24 hr tablet   Multiple Vitamin (MULTIVITAMIN WITH MINERALS) TABS tablet   olmesartan (BENICAR) 40 MG tablet   spironolactone (ALDACTONE) 25 MG tablet   temazepam (RESTORIL) 30 MG capsule   traMADol (ULTRAM) 50 MG tablet   No current facility-administered medications for this encounter.     Konrad Felix Ward, PA-C [facility] [phone]

## 2021-12-02 NOTE — Anesthesia Preprocedure Evaluation (Addendum)
Anesthesia Evaluation  Patient identified by MRN, date of birth, ID band Patient awake    Reviewed: Allergy & Precautions, NPO status , Patient's Chart, lab work & pertinent test results  Airway Mallampati: II  TM Distance: >3 FB Neck ROM: Full    Dental no notable dental hx.    Pulmonary shortness of breath, sleep apnea , COPD, former smoker,    Pulmonary exam normal breath sounds clear to auscultation       Cardiovascular hypertension, Pt. on medications + CAD and +CHF  Normal cardiovascular exam+ dysrhythmias  Rhythm:Regular Rate:Normal     Neuro/Psych Anxiety negative neurological ROS  negative psych ROS   GI/Hepatic negative GI ROS, Neg liver ROS,   Endo/Other  Hypothyroidism   Renal/GU Renal InsufficiencyRenal disease  negative genitourinary   Musculoskeletal  (+) Arthritis , Osteoarthritis,    Abdominal (+) + obese,   Peds negative pediatric ROS (+)  Hematology negative hematology ROS (+)   Anesthesia Other Findings   Reproductive/Obstetrics negative OB ROS                            Anesthesia Physical Anesthesia Plan  ASA: 3  Anesthesia Plan: General   Post-op Pain Management:    Induction: Intravenous  PONV Risk Score and Plan: 2 and Ondansetron, Midazolam and Treatment may vary due to age or medical condition  Airway Management Planned: LMA  Additional Equipment:   Intra-op Plan:   Post-operative Plan: Extubation in OR  Informed Consent: I have reviewed the patients History and Physical, chart, labs and discussed the procedure including the risks, benefits and alternatives for the proposed anesthesia with the patient or authorized representative who has indicated his/her understanding and acceptance.     Dental advisory given  Plan Discussed with: CRNA  Anesthesia Plan Comments: (See PAT note 11/28/2021)       Anesthesia Quick Evaluation

## 2021-12-09 NOTE — H&P (Signed)
Scott [name] is an 66 y.o. male.   Chief Complaint: Right knee pain HPI: Scott [name] continues with pain and a bad catch in his right knee.  He says it makes him almost fall.  He did get some transient relief with an injection more than a year ago.  We were suspicious about degeneration at his hip and tried an injection there last year but it did not affect things even transiently.  He is about 2-1/2 years from his right knee replacement.  We have aspirated in the past and ruled out infection.   Radiographs:  X-rays that were ordered, performed, and interpreted by me today included 3 views of the right knee.  These showed good placement of his components with no sign of loosening.  Also took AP pelvis and lateral of the right hip and he has mild if any degenerative change that I can appreciate.  Past Medical History:  Diagnosis Date   ALLERGIC RHINITIS 01/14/2007   Allergy    ANXIETY 09/28/2006   Asbestos exposure    Atrial fibrillation ([place]) 09/28/2006   s/p afib ablation x 2   CHF (congestive heart failure) (HCC)    CKD (chronic kidney disease), stage III ([place])    patient denies   COPD (chronic obstructive pulmonary disease) ([place])    Coronary artery disease 05/20/7122   Diastolic dysfunction 07/01/996   Dyspnea    W/ EXERTION    Dysrhythmia    AFIB      First degree AV block 10/12/2018   Noted on EKG   Gallstones    Hyperglycemia    HYPERLIPIDEMIA 09/28/2006   HYPERTENSION 09/28/2006   HYPOTHYROIDISM, ACQUIRED NEC 09/28/2006   pt denies and doesn't know   Long term current use of anticoagulant 04/08/2010   Obese    OBSTRUCTIVE SLEEP APNEA 01/06/2008   NOT USING     Right knee DJD 07/09/2010   S/P CABG x 1 11/26/2011   Right internal mammary artery to right coronary artery   S/P Maze operation for atrial fibrillation 11/26/2011   complete biatrial lesion set with clipping of LA appendage   Sleep apnea     Past Surgical History:  Procedure Laterality Date   ANTERIOR CERVICAL  DECOMP/DISCECTOMY FUSION N/A 06/20/2016   Procedure: Anterior Cervical Discectomy Fusion - Cervical five-Cervical six - Cervical six-Cervical seven - Cervicla seven- Thoracic one;  Surgeon: Earnie Larsson, MD;  Location: [place];  Service: Neurosurgery;  Laterality: N/A;   CARDIOVERSION     X4    CORONARY ARTERY BYPASS GRAFT  11/26/2011   Procedure: CORONARY ARTERY BYPASS GRAFTING (CABG);  Surgeon: Rexene Alberts, MD;  Location: [place];  Service: Open Heart Surgery;  Laterality: N/A;  coronary artery bypass on pump times one utilizing the right internal mammary artery, transesophageal echocardiogram    HAND SURGERY Left    LEFT HEART CATHETERIZATION WITH CORONARY ANGIOGRAM N/A 11/24/2011   Procedure: LEFT HEART CATHETERIZATION WITH CORONARY ANGIOGRAM;  Surgeon: Peter M Martinique, MD;  Location[facility] LAB;  Service: Cardiovascular;  Laterality: N/A;   LOOP RECORDER IMPLANT N/A 07/06/2012   Procedure: LOOP RECORDER IMPLANT;  Surgeon: Thompson Grayer, MD;  Location: [facility] CATH LAB;  Service: Cardiovascular;  Laterality: N/A;   MAZE  11/26/2011   Procedure: MAZE;  Surgeon: Rexene Alberts, MD;  Location: [place];  Service: Open Heart Surgery;  Laterality: N/A;  Cryomaze    Radiofrequency Ablation for atrial fibrillation  12/30/07 and 06/05/09   afib ablation x 2  by [facility]   Radiofrequency ablation for atrial flutter  2005   CTI   TOTAL KNEE ARTHROPLASTY Right 04/05/2019   Procedure: RIGHT TOTAL KNEE ARTHROPLASTY;  Surgeon: Melrose Nakayama, MD;  Location: [facility];  Service: Orthopedics;  Laterality: Right;    Family History  Problem Relation Age of Onset   Dementia Father    Hypertension Father    Atrial fibrillation Mother    Stroke Mother    Dementia Paternal Grandfather    Cancer Paternal Grandmother        type unknown   Colon cancer Neg Hx    Esophageal cancer Neg Hx    Prostate cancer Neg Hx    Rectal cancer Neg Hx    Stomach cancer Neg Hx    Social History:  reports that he quit smoking about 26 years ago.  His smoking use included cigarettes. He has a 39.00 pack-year smoking history. He has never used smokeless tobacco. He reports that he does not drink alcohol and does not use drugs.  Allergies:  Allergies  Allergen Reactions   Zolpidem Tartrate Other (See Comments)    Extremely drowsy the next day    No medications prior to admission.    No results found for this or any previous visit (from the past 48 hour(s)). No results found.  Review of Systems  Musculoskeletal:  Positive for arthralgias.       Right knee  All other systems reviewed and are negative.   There were no vitals taken for this visit. Physical Exam Constitutional:      Appearance: Normal appearance.  HENT:     Head: Normocephalic and atraumatic.     Nose: Nose normal.     Mouth/Throat:     Pharynx: Oropharynx is clear.  Pulmonary:     Effort: Pulmonary effort is normal.  Abdominal:     Palpations: Abdomen is soft.  Musculoskeletal:     Cervical back: Neck supple.     Comments: Right knee exhibits some pain through the patellofemoral portion.  He has no effusion I can appreciate.  His motion remains good from 0-120.  He has good stability to varus and valgus stress.  There is some crepitation and a bit of a pop on range.  There is no redness or warmth to the knee.  Hip motion does seem painful especially with internal rotation.   Skin:    General: Skin is warm and dry.  Neurological:     General: No focal deficit present.     Mental Status: He is alert and oriented to person, place, and time. Mental status is at baseline.  Psychiatric:        Mood and Affect: Mood normal.        Behavior: Behavior normal.        Thought Content: Thought content normal.        Judgment: Judgment normal.      Assessment/Plan Assessment:  Status post right TKR 2021 with probable fibrous band injected 2022  Plan: Scott [name] is quite frustrated with the snapping sensation and pain inside the knee joint.  He got some transient  relief with an injection.  We have ruled out infection and also for the most part ruled out referred pain from the hip.  I have offered him a knee arthroscopy.  Were he did fail that we might look into radiculopathy from his back but he feels a real mechanical catch in the knee itself.  I reviewed risk of anesthesia, infection,  DVT.  Rich Fuchs, PA-C 12/09/2021, 1:59 PM

## 2021-12-10 ENCOUNTER — Ambulatory Visit (HOSPITAL_COMMUNITY)
Admission: RE | Admit: 2021-12-10 | Discharge: 2021-12-10 | Disposition: A | Discharge status: Home / Self Care | Payer: HMO | Attending: Orthopaedic Surgery | Admitting: Orthopaedic Surgery

## 2021-12-10 ENCOUNTER — Other Ambulatory Visit: Payer: Self-pay

## 2021-12-10 ENCOUNTER — Ambulatory Visit (HOSPITAL_COMMUNITY): Payer: HMO | Admitting: Physician Assistant

## 2021-12-10 ENCOUNTER — Encounter (HOSPITAL_COMMUNITY)
Admission: RE | Disposition: A | Discharge status: Home / Self Care | Payer: Self-pay | Source: Home / Self Care | Attending: Orthopaedic Surgery

## 2021-12-10 ENCOUNTER — Ambulatory Visit (HOSPITAL_BASED_OUTPATIENT_CLINIC_OR_DEPARTMENT_OTHER): Payer: HMO | Admitting: Anesthesiology

## 2021-12-10 ENCOUNTER — Encounter (HOSPITAL_COMMUNITY): Payer: Self-pay | Admitting: Orthopaedic Surgery

## 2021-12-10 DIAGNOSIS — I130 Hypertensive heart and chronic kidney disease with heart failure and stage 1 through stage 4 chronic kidney disease, or unspecified chronic kidney disease: Secondary | ICD-10-CM | POA: Insufficient documentation

## 2021-12-10 DIAGNOSIS — I2510 Atherosclerotic heart disease of native coronary artery without angina pectoris: Secondary | ICD-10-CM

## 2021-12-10 DIAGNOSIS — Z96651 Presence of right artificial knee joint: Secondary | ICD-10-CM | POA: Insufficient documentation

## 2021-12-10 DIAGNOSIS — M6751 Plica syndrome, right knee: Secondary | ICD-10-CM

## 2021-12-10 DIAGNOSIS — Z87891 Personal history of nicotine dependence: Secondary | ICD-10-CM | POA: Insufficient documentation

## 2021-12-10 DIAGNOSIS — N183 Chronic kidney disease, stage 3 unspecified: Secondary | ICD-10-CM | POA: Insufficient documentation

## 2021-12-10 DIAGNOSIS — J449 Chronic obstructive pulmonary disease, unspecified: Secondary | ICD-10-CM | POA: Diagnosis not present

## 2021-12-10 DIAGNOSIS — T8484XD Pain due to internal orthopedic prosthetic devices, implants and grafts, subsequent encounter: Secondary | ICD-10-CM

## 2021-12-10 DIAGNOSIS — Z6834 Body mass index (BMI) 34.0-34.9, adult: Secondary | ICD-10-CM | POA: Diagnosis not present

## 2021-12-10 DIAGNOSIS — I509 Heart failure, unspecified: Secondary | ICD-10-CM

## 2021-12-10 DIAGNOSIS — E669 Obesity, unspecified: Secondary | ICD-10-CM | POA: Insufficient documentation

## 2021-12-10 DIAGNOSIS — T8484XA Pain due to internal orthopedic prosthetic devices, implants and grafts, initial encounter: Secondary | ICD-10-CM

## 2021-12-10 DIAGNOSIS — I110 Hypertensive heart disease with heart failure: Secondary | ICD-10-CM

## 2021-12-10 HISTORY — PX: KNEE ARTHROSCOPY: SHX127

## 2021-12-10 SURGERY — ARTHROSCOPY, KNEE
Anesthesia: General | Site: Knee | Laterality: Right

## 2021-12-10 MED ORDER — PHENYLEPHRINE 80 MCG/ML (10ML) SYRINGE FOR IV PUSH (FOR BLOOD PRESSURE SUPPORT)
PREFILLED_SYRINGE | INTRAVENOUS | Status: DC | PRN
  Administered 2021-12-10 (×2): 80 ug via INTRAVENOUS
  Administered 2021-12-10: 160 ug via INTRAVENOUS

## 2021-12-10 MED ORDER — CEFAZOLIN SODIUM-DEXTROSE 2-4 GM/100ML-% IV SOLN
2.0000 g | INTRAVENOUS | Status: AC
  Administered 2021-12-10: 2 g via INTRAVENOUS
  Filled 2021-12-10: qty 100

## 2021-12-10 MED ORDER — EPINEPHRINE (ANAPHYLAXIS) 1 MG/ML IJ SOLN
INTRAMUSCULAR | Status: DC | PRN
  Administered 2021-12-10: 1 mL

## 2021-12-10 MED ORDER — FENTANYL CITRATE (PF) 100 MCG/2ML IJ SOLN
INTRAMUSCULAR | Status: AC
  Filled 2021-12-10: qty 2

## 2021-12-10 MED ORDER — ONDANSETRON HCL 4 MG/2ML IJ SOLN
INTRAMUSCULAR | Status: AC
  Filled 2021-12-10: qty 2

## 2021-12-10 MED ORDER — CHLORHEXIDINE GLUCONATE 0.12 % MT SOLN
15.0000 mL | Freq: Once | OROMUCOSAL | Status: AC
  Administered 2021-12-10: 15 mL via OROMUCOSAL

## 2021-12-10 MED ORDER — FENTANYL CITRATE (PF) 100 MCG/2ML IJ SOLN
INTRAMUSCULAR | Status: DC | PRN
  Administered 2021-12-10: 100 ug via INTRAVENOUS

## 2021-12-10 MED ORDER — LACTATED RINGERS IV SOLN: INTRAVENOUS | Status: DC

## 2021-12-10 MED ORDER — GLYCOPYRROLATE 0.2 MG/ML IJ SOLN
INTRAMUSCULAR | Status: DC | PRN
  Administered 2021-12-10: .2 mg via INTRAVENOUS

## 2021-12-10 MED ORDER — PROPOFOL 10 MG/ML IV BOLUS
INTRAVENOUS | Status: DC | PRN
  Administered 2021-12-10: 130 mg via INTRAVENOUS

## 2021-12-10 MED ORDER — EPINEPHRINE PF 1 MG/ML IJ SOLN
INTRAMUSCULAR | Status: AC
  Filled 2021-12-10: qty 1

## 2021-12-10 MED ORDER — MIDAZOLAM HCL 2 MG/2ML IJ SOLN
INTRAMUSCULAR | Status: AC
  Filled 2021-12-10: qty 2

## 2021-12-10 MED ORDER — LIDOCAINE 2% (20 MG/ML) 5 ML SYRINGE
INTRAMUSCULAR | Status: DC | PRN
  Administered 2021-12-10: 100 mg via INTRAVENOUS

## 2021-12-10 MED ORDER — SODIUM CHLORIDE 0.9 % IR SOLN
Status: DC | PRN
  Administered 2021-12-10: 3000 mL

## 2021-12-10 MED ORDER — LIDOCAINE HCL (PF) 2 % IJ SOLN
INTRAMUSCULAR | Status: AC
  Filled 2021-12-10: qty 5

## 2021-12-10 MED ORDER — OXYCODONE HCL 5 MG/5ML PO SOLN: 5.0000 mg | Freq: Once | ORAL | Status: DC | PRN

## 2021-12-10 MED ORDER — PROPOFOL 10 MG/ML IV BOLUS
INTRAVENOUS | Status: AC
  Filled 2021-12-10: qty 20

## 2021-12-10 MED ORDER — BUPIVACAINE-EPINEPHRINE (PF) 0.25% -1:200000 IJ SOLN
INTRAMUSCULAR | Status: AC
  Filled 2021-12-10: qty 30

## 2021-12-10 MED ORDER — MIDAZOLAM HCL 5 MG/5ML IJ SOLN
INTRAMUSCULAR | Status: DC | PRN
  Administered 2021-12-10: 2 mg via INTRAVENOUS

## 2021-12-10 MED ORDER — BUPIVACAINE-EPINEPHRINE (PF) 0.25% -1:200000 IJ SOLN
INTRAMUSCULAR | Status: DC | PRN
  Administered 2021-12-10: 20 mL

## 2021-12-10 MED ORDER — PROMETHAZINE HCL 25 MG/ML IJ SOLN: 6.2500 mg | INTRAMUSCULAR | Status: DC | PRN

## 2021-12-10 MED ORDER — AMISULPRIDE (ANTIEMETIC) 5 MG/2ML IV SOLN: 10.0000 mg | Freq: Once | INTRAVENOUS | Status: DC | PRN

## 2021-12-10 MED ORDER — HYDROMORPHONE HCL 1 MG/ML IJ SOLN: 0.2500 mg | INTRAMUSCULAR | Status: DC | PRN

## 2021-12-10 MED ORDER — OXYCODONE HCL 5 MG PO TABS: 5.0000 mg | ORAL_TABLET | Freq: Once | ORAL | Status: DC | PRN

## 2021-12-10 MED ORDER — ORAL CARE MOUTH RINSE: 15.0000 mL | Freq: Once | OROMUCOSAL | Status: AC

## 2021-12-10 MED ORDER — ONDANSETRON HCL 4 MG/2ML IJ SOLN
INTRAMUSCULAR | Status: DC | PRN
  Administered 2021-12-10: 4 mg via INTRAVENOUS

## 2021-12-10 SURGICAL SUPPLY — 35 items
BAG COUNTER SPONGE SURGICOUNT (BAG) ×2 IMPLANT
BAG SPNG CNTER NS LX DISP (BAG) ×1
BLADE EXCALIBUR 4.0X13 (MISCELLANEOUS) IMPLANT
BNDG ELASTIC 6X5.8 VLCR STR LF (GAUZE/BANDAGES/DRESSINGS) ×2 IMPLANT
BNDG GAUZE DERMACEA FLUFF 4 (GAUZE/BANDAGES/DRESSINGS) ×2 IMPLANT
BNDG GZE DERMACEA 4 6PLY (GAUZE/BANDAGES/DRESSINGS) ×1
BONE TUNNEL PLUG CANNULATED (MISCELLANEOUS) ×2 IMPLANT
BURR OVAL 8 FLU 4.0X13 (MISCELLANEOUS) ×2 IMPLANT
COVER SURGICAL LIGHT HANDLE (MISCELLANEOUS) IMPLANT
DISSECTOR 3.5MM X 13CM (MISCELLANEOUS) ×2 IMPLANT
DRAPE ARTHROSCOPY W/POUCH 114 (DRAPES) ×2 IMPLANT
DRAPE SHEET LG 3/4 BI-LAMINATE (DRAPES) ×2 IMPLANT
DRAPE U-SHAPE 47X51 STRL (DRAPES) ×2 IMPLANT
DRSG EMULSION OIL 3X3 NADH (GAUZE/BANDAGES/DRESSINGS) ×2 IMPLANT
DURAPREP 26ML APPLICATOR (WOUND CARE) ×4 IMPLANT
GAUZE 4X4 16PLY ~~LOC~~+RFID DBL (SPONGE) ×2 IMPLANT
GAUZE PAD ABD 8X10 STRL (GAUZE/BANDAGES/DRESSINGS) ×2 IMPLANT
GAUZE SPONGE 4X4 12PLY STRL (GAUZE/BANDAGES/DRESSINGS) ×2 IMPLANT
GAUZE XEROFORM 1X8 LF (GAUZE/BANDAGES/DRESSINGS) IMPLANT
GLOVE BIO SURGEON STRL SZ8 (GLOVE) ×4 IMPLANT
GLOVE BIOGEL PI IND STRL 8 (GLOVE) ×4 IMPLANT
GOWN STRL REUS W/ TWL XL LVL3 (GOWN DISPOSABLE) ×4 IMPLANT
GOWN STRL REUS W/TWL XL LVL3 (GOWN DISPOSABLE) ×2
KIT BASIN OR (CUSTOM PROCEDURE TRAY) ×2 IMPLANT
MANIFOLD NEPTUNE II (INSTRUMENTS) ×2 IMPLANT
NDL SPNL 18GX3.5 QUINCKE PK (NEEDLE) IMPLANT
NEEDLE HYPO 22GX1.5 SAFETY (NEEDLE) ×2 IMPLANT
NEEDLE SPNL 18GX3.5 QUINCKE PK (NEEDLE) IMPLANT
PACK ARTHROSCOPY WL (CUSTOM PROCEDURE TRAY) ×2 IMPLANT
PENCIL SMOKE EVACUATOR (MISCELLANEOUS) IMPLANT
SYR CONTROL 10ML LL (SYRINGE) ×2 IMPLANT
TOWEL OR 17X26 10 PK STRL BLUE (TOWEL DISPOSABLE) ×2 IMPLANT
TUBING ARTHROSCOPY IRRIG 16FT (MISCELLANEOUS) ×2 IMPLANT
WATER STERILE IRR 1000ML POUR (IV SOLUTION) ×2 IMPLANT
WRAP KNEE MAXI GEL POST OP (GAUZE/BANDAGES/DRESSINGS) IMPLANT

## 2021-12-10 NOTE — Transfer of Care (Signed)
Immediate Anesthesia Transfer of Care Note  Patient: Scott [name]  Procedure(s) Performed: [facility] TKNEE ARTHROSCOPY WITH SCAR BAND EXCISION (Right: Knee)  Patient Location: PACU  Anesthesia Type:General  Level of Consciousness: sedated  Airway & Oxygen Therapy: Patient Spontanous Breathing and Patient connected to face mask oxygen  Post-op Assessment: Report given to RN and Post -op Vital signs reviewed and stable  Post vital signs: Reviewed and stable  Last Vitals:  Vitals Value Taken Time  BP    Temp    Pulse 71 12/10/21 1454  Resp 11 12/10/21 1454  SpO2 100 % 12/10/21 1454  Vitals shown include unvalidated device data.  Last Pain:  Vitals:   12/10/21 1234  TempSrc: Oral         Complications: No notable events documented.

## 2021-12-10 NOTE — Anesthesia Postprocedure Evaluation (Signed)
Anesthesia Post Note  Patient: Scott [name]  Procedure(s) Performed: RIGH TKNEE ARTHROSCOPY WITH SCAR BAND EXCISION (Right: Knee)     Patient location during evaluation: PACU Anesthesia Type: General Level of consciousness: awake and alert Pain management: pain level controlled Vital Signs Assessment: post-procedure vital signs reviewed and stable Respiratory status: spontaneous breathing, nonlabored ventilation and respiratory function stable Cardiovascular status: blood pressure returned to baseline and stable Postop Assessment: no apparent nausea or vomiting Anesthetic complications: no   No notable events documented.  Last Vitals:  Vitals:   12/10/21 1530 12/10/21 1550  BP: 117/72 123/81  Pulse: 62 (!) 58  Resp: 11 16  Temp:    SpO2: 96% 95%    Last Pain:  Vitals:   12/10/21 1550  TempSrc:   PainSc: 0-No pain                 Lynda Rainwater

## 2021-12-10 NOTE — Op Note (Signed)
NAME: Scott [name], Scott [name] RECORD NO: [id number] ACCOUNT NO: [id number] DATE OF BIRTH: 1955-05-24 FACILITY: [facility] LOCATION: [facility] PHYSICIAN: Monico Blitz. Rhona Raider, MD  Operative Report   DATE OF PROCEDURE: 12/10/2021  PREOPERATIVE DIAGNOSIS:  Right knee plica.  POSTOPERATIVE DIAGNOSIS:  Right knee plica.  PROCEDURE:  Right knee arthroscopic synovectomy.  ANESTHESIA:  General.  ATTENDING SURGEON:  Monico Blitz. Rhona Raider, MD  ASSISTANT:  Loni Dolly, PA.  INDICATIONS:  The patient is a 66 year old man several years from a knee replacement.  He has persisted with a mechanical catch in his knee, which has been unresponsive to bracing and therapy.  Injection did afford him some transient relief.  He has  presumed to have a fibrous band and is offered arthroscopic intervention.  Informed operative consent was obtained after discussion of possible complications including, reaction to anesthesia and infection.  SUMMARY OF FINDINGS AND PROCEDURE:  Under general anesthesia, an arthroscopy of the right knee was performed.  He did have a medial shelf plica, which I excised.  This appeared to be catching in the patellofemoral joint and hopefully will ameliorate his  issue.  He was closed primarily and scheduled to go home the same day.  DESCRIPTION OF THE PROCEDURE:  The patient was taken to the operating suite where general anesthetic was applied without difficulty.  He was positioned supine and prepped and draped in normal sterile fashion.  After the administration of preoperative IV  Kefzol and appropriate time-out, an arthroscopy of the right knee was performed through a total of 2 portals.  Findings were as noted above.  The procedure consisted of the synovectomy removing the medial shelf plica.  Otherwise, the components appeared  well fixed.  The knee was thoroughly irrigated, followed by removal of arthroscopic equipment.  We reapproximated portals loosely with nylon followed by Adaptic, dry  gauze and a loose Ace wrap.  Estimated blood loss and intraoperative fluids can be  obtained from anesthesia records.  DISPOSITION:  The patient was extubated in the operating room and taken to recovery room in stable condition.  Plans were for him to go home same day and follow up in the office in less than a week.  I will contact him by phone tonight.   PUS D: 12/10/2021 2:48:14 pm T: 12/10/2021 5:44:00 pm  JOB: [id number]/ [id number]

## 2021-12-10 NOTE — Anesthesia Procedure Notes (Signed)
Procedure Name: LMA Insertion Date/Time: 12/10/2021 2:07 PM  Performed by: Lind Covert, CRNAPre-anesthesia Checklist: Patient identified, Emergency Drugs available, Suction available, Patient being monitored and Timeout performed Patient Re-evaluated:Patient Re-evaluated prior to induction Oxygen Delivery Method: Circle system utilized Preoxygenation: Pre-oxygenation with 100% oxygen Induction Type: IV induction LMA: LMA inserted LMA Size: 4.0 Tube type: Oral Number of attempts: 1 Placement Confirmation: positive ETCO2 and breath sounds checked- equal and bilateral Tube secured with: Tape Dental Injury: Teeth and Oropharynx as per pre-operative assessment

## 2021-12-10 NOTE — Interval H&P Note (Signed)
History and Physical Interval Note:  12/10/2021 1:18 PM  Scott [name]  has presented today for surgery, with the diagnosis of RIGHT KNEE PAINFUL TOTAL KNEE REPLACEMENT.  The various methods of treatment have been discussed with the patient and family. After consideration of risks, benefits and other options for treatment, the patient has consented to  Procedure(s): RIGH TKNEE ARTHROSCOPY (Right) as a surgical intervention.  The patient's history has been reviewed, patient examined, no change in status, stable for surgery.  I have reviewed the patient's chart and labs.  Questions were answered to the patient's satisfaction.     Hessie Dibble

## 2021-12-10 NOTE — Brief Op Note (Signed)
Scott [name] [id number] 12/10/2021   PRE-OP DIAGNOSIS: right knee plica  POST-OP DIAGNOSIS: same  PROCEDURE: right knee scope  ANESTHESIA: general   Hessie Dibble   Dictation #:  [id number]

## 2021-12-11 ENCOUNTER — Encounter (HOSPITAL_COMMUNITY): Payer: Self-pay | Admitting: Orthopaedic Surgery

## 2021-12-12 DIAGNOSIS — Z951 Presence of aortocoronary bypass graft: Secondary | ICD-10-CM | POA: Diagnosis not present

## 2021-12-12 DIAGNOSIS — I2510 Atherosclerotic heart disease of native coronary artery without angina pectoris: Secondary | ICD-10-CM | POA: Diagnosis not present

## 2021-12-12 DIAGNOSIS — N1832 Chronic kidney disease, stage 3b: Secondary | ICD-10-CM | POA: Diagnosis not present

## 2021-12-12 DIAGNOSIS — I739 Peripheral vascular disease, unspecified: Secondary | ICD-10-CM | POA: Diagnosis not present

## 2021-12-12 DIAGNOSIS — Z66 Do not resuscitate: Secondary | ICD-10-CM | POA: Diagnosis not present

## 2021-12-16 ENCOUNTER — Telehealth: Payer: Self-pay | Admitting: Internal Medicine

## 2021-12-16 DIAGNOSIS — H25811 Combined forms of age-related cataract, right eye: Secondary | ICD-10-CM | POA: Diagnosis not present

## 2021-12-16 DIAGNOSIS — H25812 Combined forms of age-related cataract, left eye: Secondary | ICD-10-CM | POA: Diagnosis not present

## 2021-12-16 NOTE — Telephone Encounter (Signed)
Received Medical Clearance form from [facility]. Paperwork printed out and placed in Dr. Gwynn Burly box at the front.

## 2021-12-17 DIAGNOSIS — M25661 Stiffness of right knee, not elsewhere classified: Secondary | ICD-10-CM | POA: Diagnosis not present

## 2021-12-18 NOTE — Telephone Encounter (Signed)
Form completion in progress 

## 2021-12-20 DIAGNOSIS — M25661 Stiffness of right knee, not elsewhere classified: Secondary | ICD-10-CM | POA: Diagnosis not present

## 2021-12-23 DIAGNOSIS — Z9889 Other specified postprocedural states: Secondary | ICD-10-CM | POA: Diagnosis not present

## 2021-12-23 NOTE — Telephone Encounter (Signed)
Patient needed ROV, appt scheduled for 11/6 at 8:20am.

## 2021-12-24 DIAGNOSIS — M25661 Stiffness of right knee, not elsewhere classified: Secondary | ICD-10-CM | POA: Diagnosis not present

## 2021-12-30 ENCOUNTER — Ambulatory Visit (INDEPENDENT_AMBULATORY_CARE_PROVIDER_SITE_OTHER): Payer: HMO | Admitting: Internal Medicine

## 2021-12-30 ENCOUNTER — Encounter: Payer: Self-pay | Admitting: Internal Medicine

## 2021-12-30 VITALS — BP 110/62 | HR 62 | Temp 97.6°F | Ht 66.0 in | Wt 212.1 lb

## 2021-12-30 DIAGNOSIS — Z7901 Long term (current) use of anticoagulants: Secondary | ICD-10-CM | POA: Diagnosis not present

## 2021-12-30 DIAGNOSIS — R739 Hyperglycemia, unspecified: Secondary | ICD-10-CM

## 2021-12-30 DIAGNOSIS — H269 Unspecified cataract: Secondary | ICD-10-CM

## 2021-12-30 DIAGNOSIS — I10 Essential (primary) hypertension: Secondary | ICD-10-CM

## 2021-12-30 NOTE — Assessment & Plan Note (Addendum)
Ok to hold the eliquis starting now coincidently, and restart post procedure or as otherwise directed per optho. No lovenoex bridging needed

## 2021-12-30 NOTE — Assessment & Plan Note (Signed)
BP Readings from Last 3 Encounters:  12/30/21 110/62  12/10/21 123/81  11/29/21 116/64   Stable, pt to continue medical treatment norvasc 5 mg, hct 12.5 mg qd, toprol xl 50 mg - 1.5 qd, benicar 40 mg qd

## 2021-12-30 NOTE — Patient Instructions (Addendum)
[place] for left eye cataract surgury, and I believe your form was actually done last wk and signed  Ok to Hold the Eliquis x 3 days prior, then restart  Please continue all other medications as before, including the tramadol for which you should still have refills  Please have the pharmacy call with any other refills you may need.  Please continue your efforts at being more active, low cholesterol diet, and weight control  Please keep your appointments with your specialists as you may have planned  Please make an Appointment to return in Apr 07, 2022 as planned

## 2021-12-30 NOTE — Progress Notes (Signed)
Patient ID: Scott [name], male   DOB: 11/24/1955, 66 y.o.   MRN: [id number]        Chief Complaint: follow up for left eye cataract surgury clearance thur Nov 9, chronic anticoagulation, htn, hyperglycemia       HPI:  Scott [name] is a 66 y.o. male here with above, s/p right knee TKR recently with f/u sugury for scar tissue oct 17.  Has gained some wt with being slowed down.  Still walking the dogs about 2 miles per day, both dogs about 90 lbs.  Pt denies chest pain, increased sob or doe over baseline, no wheezing, orthopnea, PND, increased LE swelling, palpitations, dizziness or syncope.   Pt denies polydipsia, polyuria, or new focal neuro s/s.  Uses the inhaler with walking.      Wt Readings from Last 3 Encounters:  12/30/21 212 lb 2 oz (96.2 kg)  12/10/21 213 lb (96.6 kg)  11/29/21 213 lb (96.6 kg)   BP Readings from Last 3 Encounters:  12/30/21 110/62  12/10/21 123/81  11/29/21 116/64         Past Medical History:  Diagnosis Date   ALLERGIC RHINITIS 01/14/2007   Allergy    ANXIETY 09/28/2006   Asbestos exposure    Atrial fibrillation ([place]) 09/28/2006   s/p afib ablation x 2   CHF (congestive heart failure) ([place])    CKD (chronic kidney disease), stage III ([place])    patient denies   COPD (chronic obstructive pulmonary disease) ([place])    Coronary artery disease 06/02/8117   Diastolic dysfunction 02/28/7827   Dyspnea    W/ EXERTION    Dysrhythmia    AFIB      First degree AV block 10/12/2018   Noted on EKG   Gallstones    Hyperglycemia    HYPERLIPIDEMIA 09/28/2006   HYPERTENSION 09/28/2006   HYPOTHYROIDISM, ACQUIRED NEC 09/28/2006   pt denies and doesn't know   Long term current use of anticoagulant 04/08/2010   Obese    OBSTRUCTIVE SLEEP APNEA 01/06/2008   NOT USING     Right knee DJD 07/09/2010   S/P CABG x 1 11/26/2011   Right internal mammary artery to right coronary artery   S/P Maze operation for atrial fibrillation 11/26/2011   complete biatrial lesion set with  clipping of LA appendage   Sleep apnea    Past Surgical History:  Procedure Laterality Date   ANTERIOR CERVICAL DECOMP/DISCECTOMY FUSION N/A 06/20/2016   Procedure: Anterior Cervical Discectomy Fusion - Cervical five-Cervical six - Cervical six-Cervical seven - Cervicla seven- Thoracic one;  Surgeon: Earnie Larsson, MD;  Location: [place];  Service: Neurosurgery;  Laterality: N/A;   CARDIOVERSION     X4    CORONARY ARTERY BYPASS GRAFT  11/26/2011   Procedure: CORONARY ARTERY BYPASS GRAFTING (CABG);  Surgeon: Rexene Alberts, MD;  Location: [place];  Service: Open Heart Surgery;  Laterality: N/A;  coronary artery bypass on pump times one utilizing the right internal mammary artery, transesophageal echocardiogram    HAND SURGERY Left    KNEE ARTHROSCOPY Right 12/10/2021   Procedure: RIGH TKNEE ARTHROSCOPY WITH SCAR BAND EXCISION;  Surgeon: Melrose Nakayama, MD;  [facility];  Service: Orthopedics;  Laterality: Right;   LEFT HEART CATHETERIZATION WITH CORONARY ANGIOGRAM N/A 11/24/2011   Procedure: LEFT HEART CATHETERIZATION WITH CORONARY ANGIOGRAM;  Surgeon: Peter M Martinique, MD;  Location[facility] CATH LAB;  Service: Cardiovascular;  Laterality: N/A;   LOOP RECORDER IMPLANT N/A 07/06/2012  Procedure: LOOP RECORDER IMPLANT;  Surgeon: Thompson Grayer, MD;  Location: [facility] CATH LAB;  Service: Cardiovascular;  Laterality: N/A;   MAZE  11/26/2011   Procedure: MAZE;  Surgeon: Rexene Alberts, MD;  Location: [place];  Service: Open Heart Surgery;  Laterality: N/A;  Cryomaze    Radiofrequency Ablation for atrial fibrillation  12/30/07 and 06/05/09   afib ablation x 2 by [facility]   Radiofrequency ablation for atrial flutter  2005   CTI   TOTAL KNEE ARTHROPLASTY Right 04/05/2019   Procedure: RIGHT TOTAL KNEE ARTHROPLASTY;  Surgeon: Melrose Nakayama, MD;  [facility];  Service: Orthopedics;  Laterality: Right;    reports that he quit smoking about 26 years ago. His smoking use included cigarettes. He has a 39.00 pack-year smoking  history. He has never used smokeless tobacco. He reports that he does not drink alcohol and does not use drugs. family history includes Atrial fibrillation in his mother; Cancer in his paternal grandmother; Dementia in his father and paternal grandfather; Hypertension in his father; Stroke in his mother. Allergies  Allergen Reactions   Zolpidem Tartrate Other (See Comments)    Extremely drowsy the next day   Current Outpatient Medications on File Prior to Visit  Medication Sig Dispense Refill   acetaminophen (TYLENOL) 500 MG tablet Take 1,000 mg by mouth every 6 (six) hours as needed for mild pain.      albuterol (VENTOLIN HFA) 108 (90 Base) MCG/ACT inhaler TAKE 2 PUFFS BY MOUTH EVERY 6 HOURS AS NEEDED FOR WHEEZE OR SHORTNESS OF BREATH 8.5 each 5   ALPRAZolam (XANAX) 0.5 MG tablet TAKE 1 TABLET BY MOUTH TWICE A DAY AS NEEDED FOR ANXIETY 60 tablet 2   amLODipine (NORVASC) 5 MG tablet Take 1 tablet (5 mg total) by mouth daily. 90 tablet 3   apixaban (ELIQUIS) 5 MG TABS tablet Take 1 tablet (5 mg total) by mouth 2 (two) times daily. 180 tablet 3   budesonide-formoterol (SYMBICORT) 160-4.5 MCG/ACT inhaler TAKE 2 PUFFS BY MOUTH TWICE A DAY 30.6 each 4   cholecalciferol (VITAMIN D) 1000 units tablet Take 1,000 Units by mouth daily.     clotrimazole-betamethasone (LOTRISONE) cream Apply 1 Application topically daily. 30 g 1   co-enzyme Q-10 30 MG capsule Take 30 mg by mouth daily.     cyclobenzaprine (FLEXERIL) 5 MG tablet Take 1 tablet (5 mg total) by mouth 3 (three) times daily as needed for muscle spasms. 40 tablet 1   docusate sodium (COLACE) 100 MG capsule Take 200 mg by mouth daily as needed for mild constipation.      doxycycline (VIBRA-TABS) 100 MG tablet Take 1 tablet (100 mg total) by mouth 2 (two) times daily. 20 tablet 0   furosemide (LASIX) 20 MG tablet TAKE 1 TABLET BY MOUTH EVERY DAY AS NEEDED (Patient taking differently: Take 20 mg by mouth daily.) 15 tablet 0   gabapentin (NEURONTIN)  100 MG capsule TAKE 1 CAPSULE BY MOUTH THREE TIMES A DAY 270 capsule 3   hydrochlorothiazide (MICROZIDE) 12.5 MG capsule Take 1 capsule (12.5 mg total) by mouth daily. 90 capsule 3   lovastatin (MEVACOR) 40 MG tablet Take 2 tablets (80 mg total) by mouth at bedtime. 60 tablet 3   metoprolol succinate (TOPROL-XL) 50 MG 24 hr tablet TAKE 1 AND 1/2 TABLETS DAILY BY MOUTH 135 tablet 3   Multiple Vitamin (MULTIVITAMIN WITH MINERALS) TABS tablet Take 1 tablet by mouth daily.      olmesartan (BENICAR) 40 MG tablet Take  1 tablet (40 mg total) by mouth daily. 90 tablet 3   spironolactone (ALDACTONE) 25 MG tablet TAKE 0.5 TABLETS BY MOUTH AT BEDTIME. 45 tablet 3   temazepam (RESTORIL) 30 MG capsule Take 1 capsule (30 mg total) by mouth at bedtime as needed. for sleep (Patient taking differently: Take 30 mg by mouth at bedtime.) 90 capsule 1   traMADol (ULTRAM) 50 MG tablet Take 1 tablet (50 mg total) by mouth every 6 (six) hours as needed. 120 tablet 5   No current facility-administered medications on file prior to visit.        ROS:  All others reviewed and negative.  Objective        PE:  BP 110/62   Pulse 62   Temp 97.6 F (36.4 C) (Oral)   Ht '5\' 6"'$  (1.676 m)   Wt 212 lb 2 oz (96.2 kg)   SpO2 93%   BMI 34.24 kg/m                 Constitutional: Pt appears in NAD               HENT: Head: NCAT.                Right Ear: External ear normal.                 Left Ear: External ear normal.                Eyes: . Pupils are equal, round, and reactive to light. Conjunctivae and EOM are normal               Nose: without d/c or deformity               Neck: Neck supple. Gross normal ROM               Cardiovascular: Normal rate and regular rhythm.                 Pulmonary/Chest: Effort normal and breath sounds without rales or wheezing.                Abd:  Soft, NT, ND, + BS, no organomegaly               Neurological: Pt is alert. At baseline orientation, motor grossly intact                Skin: Skin is warm. No rashes, no other new lesions, LE edema - none               Psychiatric: Pt behavior is normal without agitation   Micro: none  Cardiac tracings I have personally interpreted today:  none  Pertinent Radiological findings (summarize): none   Lab Results  Component Value Date   WBC 7.4 11/28/2021   HGB 14.0 11/28/2021   HCT 42.7 11/28/2021   PLT 188 11/28/2021   GLUCOSE 106 (H) 11/28/2021   CHOL 122 10/03/2021   TRIG 79.0 10/03/2021   HDL 39.10 10/03/2021   LDLCALC 67 10/03/2021   ALT 21 10/03/2021   AST 21 10/03/2021   NA 139 11/28/2021   K 4.0 11/28/2021   CL 106 11/28/2021   CREATININE 1.80 (H) 11/28/2021   BUN 36 (H) 11/28/2021   CO2 24 11/28/2021   TSH 2.85 10/03/2021   PSA 0.84 10/03/2021   INR 1.3 (H) 01/01/2021   HGBA1C 5.6 10/03/2021   MICROALBUR <0.7 10/03/2021   Assessment/Plan:  Scott [name] is a 66 y.o. White or Caucasian [1] male with  has a past medical history of ALLERGIC RHINITIS (01/14/2007), Allergy, ANXIETY (09/28/2006), Asbestos exposure, Atrial fibrillation ([place]) (09/28/2006), CHF (congestive heart failure) ([place]), CKD (chronic kidney disease), stage III ([place]), COPD (chronic obstructive pulmonary disease) ([place]), Coronary artery disease (8/88/2800), Diastolic dysfunction (04/27/9177), Dyspnea, Dysrhythmia, First degree AV block (10/12/2018), Gallstones, Hyperglycemia, HYPERLIPIDEMIA (09/28/2006), HYPERTENSION (09/28/2006), HYPOTHYROIDISM, ACQUIRED NEC (09/28/2006), Long term current use of anticoagulant (04/08/2010), Obese, OBSTRUCTIVE SLEEP APNEA (01/06/2008), Right knee DJD (07/09/2010), S/P CABG x 1 (11/26/2011), S/P Maze operation for atrial fibrillation (11/26/2011), and Sleep apnea.  Chronic anticoagulation Ok to hold the eliquis starting now coincidently, and restart post procedure or as otherwise directed per optho. No lovenoex bridging needed  Left cataract Ok for surgury Nov 9 as planned  Hyperglycemia Lab Results  Component Value  Date   HGBA1C 5.6 10/03/2021   Stable, pt to continue current medical treatment - diet, wt control, excercise   Essential hypertension BP Readings from Last 3 Encounters:  12/30/21 110/62  12/10/21 123/81  11/29/21 116/64   Stable, pt to continue medical treatment norvasc 5 mg, hct 12.5 mg qd, toprol xl 50 mg - 1.5 qd, benicar 40 mg qd  Followup: Return in about 14 weeks (around 04/07/2022).  Cathlean Cower, MD 12/30/2021 9:40 AM [facility]

## 2021-12-30 NOTE — Assessment & Plan Note (Signed)
[place] for surgury Nov 9 as planned

## 2021-12-30 NOTE — Assessment & Plan Note (Signed)
Lab Results  Component Value Date   HGBA1C 5.6 10/03/2021   Stable, pt to continue current medical treatment - diet, wt control, excercise

## 2021-12-31 DIAGNOSIS — M25661 Stiffness of right knee, not elsewhere classified: Secondary | ICD-10-CM | POA: Diagnosis not present

## 2021-12-31 NOTE — Telephone Encounter (Signed)
Form completion in progress 

## 2021-12-31 NOTE — Telephone Encounter (Signed)
[facility] called about the medical release form for patient as surgery is scheduled for Thursday. They will be faxing over another copy, is there any update from the previous form?

## 2022-01-01 NOTE — Telephone Encounter (Signed)
Form faxed to [facility]

## 2022-01-01 NOTE — Telephone Encounter (Signed)
[facility] called for expediting surgical clearance form.  Surgery will be canceled for tomorrow if the surgical clearance form is not received by 3 pm today per [facility].  Please check the box that says he is cleared if provider feels pt is ok to proceed with surgery tomorrow. And fax it to the number on the form.

## 2022-01-02 DIAGNOSIS — H269 Unspecified cataract: Secondary | ICD-10-CM | POA: Diagnosis not present

## 2022-01-02 DIAGNOSIS — H25812 Combined forms of age-related cataract, left eye: Secondary | ICD-10-CM | POA: Diagnosis not present

## 2022-01-03 DIAGNOSIS — Z9889 Other specified postprocedural states: Secondary | ICD-10-CM | POA: Diagnosis not present

## 2022-01-06 ENCOUNTER — Telehealth: Payer: Self-pay

## 2022-01-06 NOTE — Telephone Encounter (Signed)
I was able to speak with a Nurse from [facility] and clarify for her the note that was cut off from the fax she received with instructions from Dr. [name]. I was able to inform that Dr. [name] has written that the pts Eliquis be held x3 days prior to surgery.  *Nurse has stated the Eliquis is not  requirement for a hold with cataract surgery  I was able to get the okay from Dr. [name] as well as inform him that it is not required for pt to hold eliquis. Dr. [name] has given me verbal agreement that if it is not needed then pt is okay to take Eliquis as he does for his daily routine.

## 2022-01-07 DIAGNOSIS — M25661 Stiffness of right knee, not elsewhere classified: Secondary | ICD-10-CM | POA: Diagnosis not present

## 2022-01-09 DIAGNOSIS — M25661 Stiffness of right knee, not elsewhere classified: Secondary | ICD-10-CM | POA: Diagnosis not present

## 2022-01-21 DIAGNOSIS — M25661 Stiffness of right knee, not elsewhere classified: Secondary | ICD-10-CM | POA: Diagnosis not present

## 2022-01-23 DIAGNOSIS — M25661 Stiffness of right knee, not elsewhere classified: Secondary | ICD-10-CM | POA: Diagnosis not present

## 2022-01-28 DIAGNOSIS — M25661 Stiffness of right knee, not elsewhere classified: Secondary | ICD-10-CM | POA: Diagnosis not present

## 2022-02-04 DIAGNOSIS — M25661 Stiffness of right knee, not elsewhere classified: Secondary | ICD-10-CM | POA: Diagnosis not present

## 2022-02-07 DIAGNOSIS — M25661 Stiffness of right knee, not elsewhere classified: Secondary | ICD-10-CM | POA: Diagnosis not present

## 2022-02-10 DIAGNOSIS — M25661 Stiffness of right knee, not elsewhere classified: Secondary | ICD-10-CM | POA: Diagnosis not present

## 2022-02-13 DIAGNOSIS — M25661 Stiffness of right knee, not elsewhere classified: Secondary | ICD-10-CM | POA: Diagnosis not present

## 2022-02-20 ENCOUNTER — Ambulatory Visit: Payer: PPO

## 2022-03-06 ENCOUNTER — Other Ambulatory Visit: Payer: Self-pay | Admitting: Internal Medicine

## 2022-03-06 NOTE — Telephone Encounter (Signed)
Please refill as per office routine med refill policy (all routine meds to be refilled for 3 mo or monthly (per pt preference) up to one year from last visit, then month to month grace period for 3 mo, then further med refills will have to be denied) ? ?

## 2022-03-14 ENCOUNTER — Ambulatory Visit (INDEPENDENT_AMBULATORY_CARE_PROVIDER_SITE_OTHER): Payer: PPO

## 2022-03-14 VITALS — BP 120/60 | HR 52 | Temp 98.4°F | Resp 16 | Ht 66.0 in | Wt 214.2 lb

## 2022-03-14 DIAGNOSIS — Z0000 Encounter for general adult medical examination without abnormal findings: Secondary | ICD-10-CM | POA: Diagnosis not present

## 2022-03-14 NOTE — Patient Instructions (Addendum)
[name] , Thank you for taking time to come for your Medicare Wellness Visit. I appreciate your ongoing commitment to your health goals. Please review the following plan we discussed and let me know if I can assist you in the future.   These are the goals we discussed:  Goals   None     This is a list of the screening recommended for you and due dates:  Health Maintenance  Topic Date Due   COVID-19 Vaccine (8 - 2023-24 season) 10/25/2021   Medicare Annual Wellness Visit  03/15/2023   DTaP/Tdap/Td vaccine (3 - Tdap) 04/04/2024   Colon Cancer Screening  05/03/2025   Pneumonia Vaccine  Completed   Flu Shot  Completed   Hepatitis C Screening: USPSTF Recommendation to screen - Ages 18-79 yo.  Completed   Zoster (Shingles) Vaccine  Completed   HPV Vaccine  Aged Out    Advanced directives: Yes; documents on file.  Conditions/risks identified: Yes  Next appointment: Follow up in one year for your annual wellness visit.   Preventive Care 36 Years and Older, Male  Preventive care refers to lifestyle choices and visits with your health care provider that can promote health and wellness. What does preventive care include? A yearly physical exam. This is also called an annual well check. Dental exams once or twice a year. Routine eye exams. Ask your health care provider how often you should have your eyes checked. Personal lifestyle choices, including: Daily care of your teeth and gums. Regular physical activity. Eating a healthy diet. Avoiding tobacco and drug use. Limiting alcohol use. Practicing safe sex. Taking low doses of aspirin every day. Taking vitamin and mineral supplements as recommended by your health care provider. What happens during an annual well check? The services and screenings done by your health care provider during your annual well check will depend on your age, overall health, lifestyle risk factors, and family history of disease. Counseling  Your health  care provider may ask you questions about your: Alcohol use. Tobacco use. Drug use. Emotional well-being. Home and relationship well-being. Sexual activity. Eating habits. History of falls. Memory and ability to understand (cognition). Work and work Statistician. Screening  You may have the following tests or measurements: Height, weight, and BMI. Blood pressure. Lipid and cholesterol levels. These may be checked every 5 years, or more frequently if you are over 74 years old. Skin check. Lung cancer screening. You may have this screening every year starting at age 7 if you have a 30-pack-year history of smoking and currently smoke or have quit within the past 15 years. Fecal occult blood test (FOBT) of the stool. You may have this test every year starting at age 27. Flexible sigmoidoscopy or colonoscopy. You may have a sigmoidoscopy every 5 years or a colonoscopy every 10 years starting at age 45. Prostate cancer screening. Recommendations will vary depending on your family history and other risks. Hepatitis C blood test. Hepatitis B blood test. Sexually transmitted disease (STD) testing. Diabetes screening. This is done by checking your blood sugar (glucose) after you have not eaten for a while (fasting). You may have this done every 1-3 years. Abdominal aortic aneurysm (AAA) screening. You may need this if you are a current or former smoker. Osteoporosis. You may be screened starting at age 64 if you are at high risk. Talk with your health care provider about your test results, treatment options, and if necessary, the need for more tests. Vaccines  Your health  care provider may recommend certain vaccines, such as: Influenza vaccine. This is recommended every year. Tetanus, diphtheria, and acellular pertussis (Tdap, Td) vaccine. You may need a Td booster every 10 years. Zoster vaccine. You may need this after age 42. Pneumococcal 13-valent conjugate (PCV13) vaccine. One dose is  recommended after age 55. Pneumococcal polysaccharide (PPSV23) vaccine. One dose is recommended after age 66. Talk to your health care provider about which screenings and vaccines you need and how often you need them. This information is not intended to replace advice given to you by your health care provider. Make sure you discuss any questions you have with your health care provider. Document Released: 03/09/2015 Document Revised: 10/31/2015 Document Reviewed: 12/12/2014 Elsevier Interactive Patient Education  2017 [place] Prevention in the Home Falls can cause injuries. They can happen to people of all ages. There are many things you can do to make your home safe and to help prevent falls. What can I do on the outside of my home? Regularly fix the edges of walkways and driveways and fix any cracks. Remove anything that might make you trip as you walk through a door, such as a raised step or threshold. Trim any bushes or trees on the path to your home. Use bright outdoor lighting. Clear any walking paths of anything that might make someone trip, such as rocks or tools. Regularly check to see if handrails are loose or broken. Make sure that both sides of any steps have handrails. Any raised decks and porches should have guardrails on the edges. Have any leaves, snow, or ice cleared regularly. Use sand or salt on walking paths during winter. Clean up any spills in your garage right away. This includes oil or grease spills. What can I do in the bathroom? Use night lights. Install grab bars by the toilet and in the tub and shower. Do not use towel bars as grab bars. Use non-skid mats or decals in the tub or shower. If you need to sit down in the shower, use a plastic, non-slip stool. Keep the floor dry. Clean up any water that spills on the floor as soon as it happens. Remove soap buildup in the tub or shower regularly. Attach bath mats securely with double-sided non-slip rug  tape. Do not have throw rugs and other things on the floor that can make you trip. What can I do in the bedroom? Use night lights. Make sure that you have a light by your bed that is easy to reach. Do not use any sheets or blankets that are too big for your bed. They should not hang down onto the floor. Have a firm chair that has side arms. You can use this for support while you get dressed. Do not have throw rugs and other things on the floor that can make you trip. What can I do in the kitchen? Clean up any spills right away. Avoid walking on wet floors. Keep items that you use a lot in easy-to-reach places. If you need to reach something above you, use a strong step stool that has a grab bar. Keep electrical cords out of the way. Do not use floor polish or wax that makes floors slippery. If you must use wax, use non-skid floor wax. Do not have throw rugs and other things on the floor that can make you trip. What can I do with my stairs? Do not leave any items on the stairs. Make sure that there are handrails on  both sides of the stairs and use them. Fix handrails that are broken or loose. Make sure that handrails are as long as the stairways. Check any carpeting to make sure that it is firmly attached to the stairs. Fix any carpet that is loose or worn. Avoid having throw rugs at the top or bottom of the stairs. If you do have throw rugs, attach them to the floor with carpet tape. Make sure that you have a light switch at the top of the stairs and the bottom of the stairs. If you do not have them, ask someone to add them for you. What else can I do to help prevent falls? Wear shoes that: Do not have high heels. Have rubber bottoms. Are comfortable and fit you well. Are closed at the toe. Do not wear sandals. If you use a stepladder: Make sure that it is fully opened. Do not climb a closed stepladder. Make sure that both sides of the stepladder are locked into place. Ask someone to  hold it for you, if possible. Clearly mark and make sure that you can see: Any grab bars or handrails. First and last steps. Where the edge of each step is. Use tools that help you move around (mobility aids) if they are needed. These include: Canes. Walkers. Scooters. Crutches. Turn on the lights when you go into a dark area. Replace any light bulbs as soon as they burn out. Set up your furniture so you have a clear path. Avoid moving your furniture around. If any of your floors are uneven, fix them. If there are any pets around you, be aware of where they are. Review your medicines with your doctor. Some medicines can make you feel dizzy. This can increase your chance of falling. Ask your doctor what other things that you can do to help prevent falls. This information is not intended to replace advice given to you by your health care provider. Make sure you discuss any questions you have with your health care provider. Document Released: 12/07/2008 Document Revised: 07/19/2015 Document Reviewed: 03/17/2014 Elsevier Interactive Patient Education  2017 [employer].

## 2022-03-14 NOTE — Progress Notes (Addendum)
Subjective:   Scott [name] is a 67 y.o. male who presents for Medicare Annual/Subsequent preventive examination.  Review of Systems     Cardiac Risk Factors include: advanced age (>68mn, >>66women);dyslipidemia;family history of premature cardiovascular disease;hypertension;male gender;obesity (BMI >30kg/m2)     Objective:    Today's Vitals   03/14/22 1055  BP: 120/60  Pulse: (!) 52  Resp: 16  Temp: 98.4 F (36.9 C)  TempSrc: Temporal  SpO2: 97%  Weight: 214 lb 3.2 oz (97.2 kg)  Height: '5\' 6"'$  (1.676 m)  PainSc: 0-No pain   Body mass index is 34.57 kg/m.     03/14/2022   10:57 AM 12/10/2021   12:20 PM 11/28/2021    8:41 AM 02/14/2021    9:10 AM 09/28/2020   10:00 AM 01/06/2020    2:37 PM 04/18/2019   11:46 AM  Advanced Directives  Does Patient Have a Medical Advance Directive? Yes Yes Yes Yes Yes Yes Yes  Type of AParamedicof AOxbowLiving will HSt. MarysLiving will     [place]Living will  Does patient want to make changes to medical advance directive? No - Patient declined No - Patient declined No - Patient declined No - Patient declined No - Patient declined No - Patient declined   Copy of [place] Chart? Yes - validated most recent copy scanned in chart (See row information) No - copy requested     No - copy requested    Current Medications (verified) Outpatient Encounter Medications as of 03/14/2022  Medication Sig   acetaminophen (TYLENOL) 500 MG tablet Take 1,000 mg by mouth every 6 (six) hours as needed for mild pain.    albuterol (VENTOLIN HFA) 108 (90 Base) MCG/ACT inhaler TAKE 2 PUFFS BY MOUTH EVERY 6 HOURS AS NEEDED FOR WHEEZE OR SHORTNESS OF BREATH   ALPRAZolam (XANAX) 0.5 MG tablet TAKE 1 TABLET BY MOUTH TWICE A DAY AS NEEDED FOR ANXIETY   amLODipine (NORVASC) 5 MG tablet Take 1 tablet (5 mg total) by mouth daily.   apixaban (ELIQUIS) 5 MG TABS tablet Take 1  tablet (5 mg total) by mouth 2 (two) times daily.   cholecalciferol (VITAMIN D) 1000 units tablet Take 1,000 Units by mouth daily.   clotrimazole-betamethasone (LOTRISONE) cream Apply 1 Application topically daily.   co-enzyme Q-10 30 MG capsule Take 30 mg by mouth daily.   cyclobenzaprine (FLEXERIL) 5 MG tablet Take 1 tablet (5 mg total) by mouth 3 (three) times daily as needed for muscle spasms.   docusate sodium (COLACE) 100 MG capsule Take 200 mg by mouth daily as needed for mild constipation.    doxycycline (VIBRA-TABS) 100 MG tablet Take 1 tablet (100 mg total) by mouth 2 (two) times daily.   Fluticasone-Umeclidin-Vilant (TRELEGY ELLIPTA) 100-62.5-25 MCG/ACT AEPB Inhale 1 puff into the lungs daily.   furosemide (LASIX) 20 MG tablet TAKE 1 TABLET BY MOUTH EVERY DAY AS NEEDED (Patient taking differently: Take 20 mg by mouth daily.)   gabapentin (NEURONTIN) 100 MG capsule TAKE 1 CAPSULE BY MOUTH THREE TIMES A DAY   hydrochlorothiazide (MICROZIDE) 12.5 MG capsule Take 1 capsule (12.5 mg total) by mouth daily.   lovastatin (MEVACOR) 40 MG tablet Take 2 tablets (80 mg total) by mouth at bedtime.   metoprolol succinate (TOPROL-XL) 50 MG 24 hr tablet TAKE 1 AND 1/2 TABLETS DAILY BY MOUTH   Multiple Vitamin (MULTIVITAMIN WITH MINERALS) TABS tablet Take 1 tablet by mouth  daily.    olmesartan (BENICAR) 40 MG tablet Take 1 tablet (40 mg total) by mouth daily.   spironolactone (ALDACTONE) 25 MG tablet TAKE 0.5 TABLETS BY MOUTH AT BEDTIME.   temazepam (RESTORIL) 30 MG capsule Take 1 capsule (30 mg total) by mouth at bedtime as needed. for sleep (Patient taking differently: Take 30 mg by mouth at bedtime.)   traMADol (ULTRAM) 50 MG tablet Take 1 tablet (50 mg total) by mouth every 6 (six) hours as needed.   No facility-administered encounter medications on file as of 03/14/2022.    Allergies (verified) Zolpidem tartrate   History: Past Medical History:  Diagnosis Date   ALLERGIC RHINITIS 01/14/2007    Allergy    ANXIETY 09/28/2006   Asbestos exposure    Atrial fibrillation ([place]) 09/28/2006   s/p afib ablation x 2   CHF (congestive heart failure) ([place])    CKD (chronic kidney disease), stage III ([place])    patient denies   COPD (chronic obstructive pulmonary disease) ([place])    Coronary artery disease 1/61/0960   Diastolic dysfunction 05/29/4096   Dyspnea    W/ EXERTION    Dysrhythmia    AFIB      First degree AV block 10/12/2018   Noted on EKG   Gallstones    Hyperglycemia    HYPERLIPIDEMIA 09/28/2006   HYPERTENSION 09/28/2006   HYPOTHYROIDISM, ACQUIRED NEC 09/28/2006   pt denies and doesn't know   Long term current use of anticoagulant 04/08/2010   Obese    OBSTRUCTIVE SLEEP APNEA 01/06/2008   NOT USING     Right knee DJD 07/09/2010   S/P CABG x 1 11/26/2011   Right internal mammary artery to right coronary artery   S/P Maze operation for atrial fibrillation 11/26/2011   complete biatrial lesion set with clipping of LA appendage   Sleep apnea    Past Surgical History:  Procedure Laterality Date   ANTERIOR CERVICAL DECOMP/DISCECTOMY FUSION N/A 06/20/2016   Procedure: Anterior Cervical Discectomy Fusion - Cervical five-Cervical six - Cervical six-Cervical seven - Cervicla seven- Thoracic one;  Surgeon: Earnie Larsson, MD;  Location: [facility];  Service: Neurosurgery;  Laterality: N/A;   CARDIOVERSION     X4    CORONARY ARTERY BYPASS GRAFT  11/26/2011   Procedure: CORONARY ARTERY BYPASS GRAFTING (CABG);  Surgeon: Rexene Alberts, MD;  Location: [facility];  Service: Open Heart Surgery;  Laterality: N/A;  coronary artery bypass on pump times one utilizing the right internal mammary artery, transesophageal echocardiogram    HAND SURGERY Left    KNEE ARTHROSCOPY Right 12/10/2021   Procedure: RIGH TKNEE ARTHROSCOPY WITH SCAR BAND EXCISION;  Surgeon: Melrose Nakayama, MD;  Location: [facility];  Service: Orthopedics;  Laterality: Right;   LEFT HEART CATHETERIZATION WITH CORONARY ANGIOGRAM N/A 11/24/2011    Procedure: LEFT HEART CATHETERIZATION WITH CORONARY ANGIOGRAM;  Surgeon: Peter M Martinique, MD;  Location[facility] C[facility] LAB;  Service: Cardiovascular;  Laterality: N/A;   LOOP RECORDER IMPLANT N/A 07/06/2012   Procedure: LOOP RECORDER IMPLANT;  Surgeon: Thompson Grayer, MD;  Location: [facility] CATH LAB;  Service: Cardiovascular;  Laterality: N/A;   MAZE  11/26/2011   Procedure: MAZE;  Surgeon: Rexene Alberts, MD;  Location: [facility];  Service: Open Heart Surgery;  Laterality: N/A;  [place]aze    Radiofrequency Ablation for atrial fibrillation  12/30/07 and 06/05/09   afib ablation x 2 by [facility]   Radiofrequency ablation for atrial flutter  2005   CTI   TOTAL KNEE ARTHROPLASTY Right  04/05/2019   Procedure: RIGHT TOTAL KNEE ARTHROPLASTY;  Surgeon: Melrose Nakayama, MD;  Location: [facility];  Service: Orthopedics;  Laterality: Right;   Family History  Problem Relation Age of Onset   Dementia Father    Hypertension Father    Atrial fibrillation Mother    Stroke Mother    Dementia Paternal Grandfather    Cancer Paternal Grandmother        type unknown   Colon cancer Neg Hx    Esophageal cancer Neg Hx    Prostate cancer Neg Hx    Rectal cancer Neg Hx    Stomach cancer Neg Hx    Social History   Socioeconomic History   Marital status: Single    Spouse name: Not on file   Number of children: 0   Years of education: Not on file   Highest education level: Not on file  Occupational History   Occupation: works at the [employer] part-time  Tobacco Use   Smoking status: Former    Packs/day: 1.50    Years: 26.00    Total pack years: 39.00    Types: Cigarettes    Quit date: 04/06/1995    Years since quitting: 26.9   Smokeless tobacco: Never  Vaping Use   Vaping Use: Never used  Substance and Sexual Activity   Alcohol use: No    Alcohol/week: 0.0 standard drinks of alcohol   Drug use: No   Sexual activity: Yes  Other Topics Concern   Not on file  Social History Narrative   Pt lives in [place], [place] with his  friend.    Social Determinants of Health   Financial Resource Strain: Low Risk  (03/14/2022)   Overall Financial Resource Strain (CARDIA)    Difficulty of Paying Living Expenses: Not hard at all  Food Insecurity: No Food Insecurity (03/14/2022)   Hunger Vital Sign    Worried About Running Out of Food in the Last Year: Never true    Ran Out of Food in the Last Year: Never true  Transportation Needs: No Transportation Needs (03/14/2022)   PRAPARE - Hydrologist (Medical): No    Lack of Transportation (Non-Medical): No  Physical Activity: Sufficiently Active (03/14/2022)   Exercise Vital Sign    Days of Exercise per Week: 6 days    Minutes of Exercise per Session: 60 min  Stress: No Stress Concern Present (03/14/2022)   [place]    Feeling of Stress : Not at all  Social Connections: Moderately Integrated (03/14/2022)   Social Connection and Isolation Panel [NHANES]    Frequency of Communication with Friends and Family: More than three times a week    Frequency of Social Gatherings with Friends and Family: More than three times a week    Attends Religious Services: 1 to 4 times per year    Active Member of [employer] or Organizations: Yes    Attends Archivist Meetings: 1 to 4 times per year    Marital Status: Never married    Tobacco Counseling Counseling given: Not Answered   Clinical Intake:  Pre-visit preparation completed: Yes  Pain : No/denies pain Pain Score: 0-No pain     Nutritional Risks: None Diabetes: No  How often do you need to have someone help you when you read instructions, pamphlets, or other written materials from your doctor or pharmacy?: 1 - Never What is the last grade level you completed in school?:  HSG  Diabetic? No  Interpreter Needed?: No  Information entered by :: Lisette Abu, LPN.   Activities of Daily Living    03/14/2022   10:58 AM  12/10/2021   12:05 PM  In your present state of health, do you have any difficulty performing the following activities:  Hearing? 0 1  Vision? 0 0  Difficulty concentrating or making decisions? 0 0  Walking or climbing stairs? 0 1  Dressing or bathing? 0 0  Doing errands, shopping? 0   Preparing Food and eating ? N   Using the Toilet? N   In the past six months, have you accidently leaked urine? N   Do you have problems with loss of bowel control? N   Managing your Medications? N   Managing your Finances? N   Housekeeping or managing your Housekeeping? N     Patient Care Team: [name], MD as PCP - General Sueanne Margarita, MD as PCP - Sleep Medicine (Cardiology) Evans Lance, MD as Consulting Physician (Cardiology) Irene Shipper, MD as Consulting Physician (Gastroenterology) Greta Doom, OD as Consulting Physician (Optometry)  Indicate any recent Medical Services you may have received from other than [facility] providers in the past year (date may be approximate).     Assessment:   This is a routine wellness examination for Scott [name].  Hearing/Vision screen Hearing Screening - Comments:: Denies hearing difficulties   Vision Screening - Comments:: Wears rx glasses - up to date with routine eye exams with Carmel [place], OD   Dietary issues and exercise activities discussed: Current Exercise Habits: Home exercise routine, Type of exercise: walking, Time (Minutes): 60, Frequency (Times/Week): 6, Weekly Exercise (Minutes/Week): 360, Intensity: Moderate, Exercise limited by: cardiac condition(s);respiratory conditions(s);orthopedic condition(s)   Goals Addressed             This Visit's Progress    Client understands the importance of follow-up with providers by attending scheduled visits.        Depression Screen    03/14/2022   10:56 AM 12/30/2021    8:27 AM 11/14/2021   10:43 AM 10/03/2021   10:07 AM 09/02/2021    9:00 AM 04/08/2021    9:00 AM 02/14/2021    9:09  AM  PHQ 2/9 Scores  PHQ - 2 Score 0 0 2 0 0 0 0  PHQ- 9 Score  0 7 0       Fall Risk    03/14/2022   10:57 AM 12/30/2021    8:26 AM 11/14/2021   10:44 AM 10/03/2021   10:07 AM 09/02/2021    9:00 AM  Fall Risk   Falls in the past year? 0 '1 1 1 1  '$ Number falls in past yr: 0 '1 1 1 1  '$ Injury with Fall? 0 0 0 0 0  Risk for fall due to : No Fall Risks History of fall(s)  History of fall(s) History of fall(s)  Follow up Falls prevention discussed Falls evaluation completed  Falls evaluation completed Falls prevention discussed    FALL RISK PREVENTION PERTAINING TO THE HOME:  Any stairs in or around the home? No  If so, are there any without handrails? No  Home free of loose throw rugs in walkways, pet beds, electrical cords, etc? Yes  Adequate lighting in your home to reduce risk of falls? Yes   ASSISTIVE DEVICES UTILIZED TO PREVENT FALLS:  Life alert? No  Use of a cane, walker or w/c? No  Grab bars in the  bathroom? No  Shower chair or bench in shower? No  Elevated toilet seat or a handicapped toilet? No   TIMED UP AND GO:  Was the test performed? Yes .  Length of time to ambulate 10 feet: 8 sec.   Gait steady and fast without use of assistive device  Cognitive Function:    10/09/2017   11:17 AM 03/21/2016    2:55 PM  MMSE - Mini Mental State Exam  Not completed: Refused   Orientation to time  4  Orientation to Place  5  Registration  3  Attention/ Calculation  5  Recall  2  Language- name 2 objects  2  Language- repeat  1  Language- follow 3 step command  3  Language- read & follow direction  1  Write a sentence  1  Copy design  1  Total score  28        03/14/2022   10:58 AM  6CIT Screen  What Year? 0 points  What month? 0 points  What time? 0 points  Count back from 20 0 points  Months in reverse 0 points  Repeat phrase 0 points  Total Score 0 points    Immunizations Immunization History  Administered Date(s) Administered   Fluad Quad(high Dose 65+)  10/31/2021, 11/14/2021   Influenza Inj Mdck Quad Pf 11/20/2018   Influenza Split 11/25/2011   Influenza Whole 01/13/2005, 11/26/2007, 12/06/2008, 11/29/2009   Influenza, High Dose Seasonal PF 12/03/2020   Influenza,inj,Quad PF,6+ Mos 03/08/2015, 11/04/2019   Influenza-Unspecified 10/25/2012, 11/21/2015, 11/25/2015, 12/25/2016, 12/10/2019   PFIZER Comirnaty(Gray Top)Covid-19 Tri-Sucrose Vaccine 07/12/2020   PFIZER(Purple Top)SARS-COV-2 Vaccination 05/26/2019, 06/20/2019, 12/16/2019, 07/13/2020   PNEUMOCOCCAL CONJUGATE-20 06/08/2020   Pfizer Covid-19 Vaccine Bivalent Booster 61yr & up 12/12/2020, 07/14/2021   Pneumococcal Conjugate-13 12/21/2015   Pneumococcal Polysaccharide-23 03/11/2011   Td 03/27/2004, 04/04/2014   Zoster Recombinat (Shingrix) 05/09/2021, 08/17/2021   Zoster, Live 01/04/2016    TDAP status: Due, Education has been provided regarding the importance of this vaccine. Advised may receive this vaccine at local pharmacy or Health Dept. Aware to provide a copy of the vaccination record if obtained from local pharmacy or Health Dept. Verbalized acceptance and understanding.  Flu Vaccine status: Up to date  Pneumococcal vaccine status: Up to date  Covid-19 vaccine status: Completed vaccines  Qualifies for Shingles Vaccine? Yes   Zostavax completed Yes   Shingrix Completed?: Yes  Screening Tests Health Maintenance  Topic Date Due   COVID-19 Vaccine (8 - 2023-24 season) 10/25/2021   Medicare Annual Wellness (AWV)  03/15/2023   DTaP/Tdap/Td (3 - Tdap) 04/04/2024   COLONOSCOPY (Pts 45-432yrInsurance coverage will need to be confirmed)  05/03/2025   Pneumonia Vaccine 67Years old  Completed   INFLUENZA VACCINE  Completed   Hepatitis C Screening  Completed   Zoster Vaccines- Shingrix  Completed   HPV VACCINES  Aged Out    Health Maintenance  Health Maintenance Due  Topic Date Due   COVID-19 Vaccine (8 - 2023-24 season) 10/25/2021    Colorectal cancer  screening: Type of screening: Colonoscopy. Completed 05/03/2020. Repeat every 5 years  Lung Cancer Screening: (Low Dose CT Chest recommended if Age 67-80ears, 30 pack-year currently smoking OR have quit w/in 15years.) does not qualify.   Lung Cancer Screening Referral: no  Additional Screening:  Hepatitis C Screening: does qualify; Completed 02/27/2017  Vision Screening: Recommended annual ophthalmology exams for early detection of glaucoma and other disorders of the eye. Is the patient up to  date with their annual eye exam?  Yes  Who is the provider or what is the name of the office in which the patient attends annual eye exams? Carmel [place], OD. If pt is not established with a provider, would they like to be referred to a provider to establish care? No .   Dental Screening: Recommended annual dental exams for proper oral hygiene  Community Resource Referral / Chronic Care Management: CRR required this visit?  No   CCM required this visit?  No      Plan:     I have personally reviewed and noted the following in the patient's chart:   Medical and social history Use of alcohol, tobacco or illicit drugs  Current medications and supplements including opioid prescriptions. Patient is not currently taking opioid prescriptions. Functional ability and status Nutritional status Physical activity Advanced directives List of other physicians Hospitalizations, surgeries, and ER visits in previous 12 months Vitals Screenings to include cognitive, depression, and falls Referrals and appointments  In addition, I have reviewed and discussed with patient certain preventive protocols, quality metrics, and best practice recommendations. A written personalized care plan for preventive services as well as general preventive health recommendations were provided to patient.     Sheral Flow, LPN   5/69/7948   Nurse Notes:  Normal cognitive status assessed by direct observation by  this Nurse Health Advisor. No abnormalities found.

## 2022-04-07 ENCOUNTER — Ambulatory Visit (INDEPENDENT_AMBULATORY_CARE_PROVIDER_SITE_OTHER): Payer: PPO

## 2022-04-07 ENCOUNTER — Ambulatory Visit (INDEPENDENT_AMBULATORY_CARE_PROVIDER_SITE_OTHER): Payer: PPO | Admitting: Internal Medicine

## 2022-04-07 VITALS — BP 122/66 | HR 66 | Temp 97.6°F | Ht 66.0 in | Wt 217.0 lb

## 2022-04-07 DIAGNOSIS — R739 Hyperglycemia, unspecified: Secondary | ICD-10-CM

## 2022-04-07 DIAGNOSIS — N1831 Chronic kidney disease, stage 3a: Secondary | ICD-10-CM | POA: Diagnosis not present

## 2022-04-07 DIAGNOSIS — I10 Essential (primary) hypertension: Secondary | ICD-10-CM

## 2022-04-07 DIAGNOSIS — E538 Deficiency of other specified B group vitamins: Secondary | ICD-10-CM

## 2022-04-07 DIAGNOSIS — E7800 Pure hypercholesterolemia, unspecified: Secondary | ICD-10-CM

## 2022-04-07 DIAGNOSIS — M25572 Pain in left ankle and joints of left foot: Secondary | ICD-10-CM

## 2022-04-07 DIAGNOSIS — Z0000 Encounter for general adult medical examination without abnormal findings: Secondary | ICD-10-CM

## 2022-04-07 DIAGNOSIS — Z0001 Encounter for general adult medical examination with abnormal findings: Secondary | ICD-10-CM

## 2022-04-07 DIAGNOSIS — E559 Vitamin D deficiency, unspecified: Secondary | ICD-10-CM

## 2022-04-07 DIAGNOSIS — Z125 Encounter for screening for malignant neoplasm of prostate: Secondary | ICD-10-CM | POA: Diagnosis not present

## 2022-04-07 LAB — HEPATIC FUNCTION PANEL
ALT: 24 U/L (ref 0–53)
AST: 24 U/L (ref 0–37)
Albumin: 4.2 g/dL (ref 3.5–5.2)
Alkaline Phosphatase: 63 U/L (ref 39–117)
Bilirubin, Direct: 0.1 mg/dL (ref 0.0–0.3)
Total Bilirubin: 0.6 mg/dL (ref 0.2–1.2)
Total Protein: 6.9 g/dL (ref 6.0–8.3)

## 2022-04-07 LAB — CBC WITH DIFFERENTIAL/PLATELET
Basophils Absolute: 0.1 10*3/uL (ref 0.0–0.1)
Basophils Relative: 0.9 % (ref 0.0–3.0)
Eosinophils Absolute: 0.5 10*3/uL (ref 0.0–0.7)
Eosinophils Relative: 5.9 % — ABNORMAL HIGH (ref 0.0–5.0)
HCT: 44.3 % (ref 39.0–52.0)
Hemoglobin: 15.1 g/dL (ref 13.0–17.0)
Lymphocytes Relative: 16.5 % (ref 12.0–46.0)
Lymphs Abs: 1.3 10*3/uL (ref 0.7–4.0)
MCHC: 34.2 g/dL (ref 30.0–36.0)
MCV: 95.7 fl (ref 78.0–100.0)
Monocytes Absolute: 0.7 10*3/uL (ref 0.1–1.0)
Monocytes Relative: 9.6 % (ref 3.0–12.0)
Neutro Abs: 5.1 10*3/uL (ref 1.4–7.7)
Neutrophils Relative %: 67.1 % (ref 43.0–77.0)
Platelets: 215 10*3/uL (ref 150.0–400.0)
RBC: 4.63 Mil/uL (ref 4.22–5.81)
RDW: 13.4 % (ref 11.5–15.5)
WBC: 7.6 10*3/uL (ref 4.0–10.5)

## 2022-04-07 LAB — LIPID PANEL
Cholesterol: 137 mg/dL (ref 0–200)
HDL: 40.7 mg/dL (ref >39.00)
LDL Cholesterol: 63 mg/dL (ref 0–99)
NonHDL: 96.6
Total CHOL/HDL Ratio: 3
Triglycerides: 166 mg/dL — ABNORMAL HIGH (ref 0.0–149.0)
VLDL: 33.2 mg/dL (ref 0.0–40.0)

## 2022-04-07 LAB — URINALYSIS, ROUTINE W REFLEX MICROSCOPIC
Bilirubin Urine: NEGATIVE
Hgb urine dipstick: NEGATIVE
Ketones, ur: NEGATIVE
Leukocytes,Ua: NEGATIVE
Nitrite: NEGATIVE
RBC / HPF: NONE SEEN (ref 0–?)
Specific Gravity, Urine: >=1.03 — AB (ref 1.000–1.030)
Total Protein, Urine: NEGATIVE
Urine Glucose: NEGATIVE
Urobilinogen, UA: 0.2 (ref 0.0–1.0)
pH: 6 (ref 5.0–8.0)

## 2022-04-07 LAB — BASIC METABOLIC PANEL
BUN: 21 mg/dL (ref 6–23)
CO2: 25 mEq/L (ref 19–32)
Calcium: 9.4 mg/dL (ref 8.4–10.5)
Chloride: 106 mEq/L (ref 96–112)
Creatinine, Ser: 1.51 mg/dL — ABNORMAL HIGH (ref 0.40–1.50)
GFR: 47.66 mL/min — ABNORMAL LOW (ref >60.00)
Glucose, Bld: 104 mg/dL — ABNORMAL HIGH (ref 70–99)
Potassium: 4.1 mEq/L (ref 3.5–5.1)
Sodium: 143 mEq/L (ref 135–145)

## 2022-04-07 LAB — VITAMIN D 25 HYDROXY (VIT D DEFICIENCY, FRACTURES): VITD: 39.39 ng/mL (ref 30.00–100.00)

## 2022-04-07 LAB — PSA: PSA: 1.02 ng/mL (ref 0.10–4.00)

## 2022-04-07 LAB — VITAMIN B12: Vitamin B-12: 427 pg/mL (ref 211–911)

## 2022-04-07 LAB — TSH: TSH: 3.15 u[IU]/mL (ref 0.35–5.50)

## 2022-04-07 LAB — HEMOGLOBIN A1C: Hgb A1c MFr Bld: 5.6 % (ref 4.6–6.5)

## 2022-04-07 NOTE — Patient Instructions (Signed)
Please continue all other medications as before, and refills have been done if requested.  Please have the pharmacy call with any other refills you may need.  Please continue your efforts at being more active, low cholesterol diet, and weight control.  You are otherwise up to date with prevention measures today.  Please keep your appointments with your specialists as you may have planned  Please go to the XRAY Department in the first floor for the x-ray testing  Please go to the LAB at the blood drawing area for the tests to be done  You will be contacted by phone if any changes need to be made immediately.  Otherwise, you will receive a letter about your results with an explanation, but please check with MyChart first.  Please remember to sign up for MyChart if you have not done so, as this will be important to you in the future with finding out test results, communicating by private email, and scheduling acute appointments online when needed.  Please make an Appointment to return in 6 months, or sooner if needed

## 2022-04-07 NOTE — Progress Notes (Unsigned)
I, Peterson Lombard, LAT, ATC acting as a scribe for Lynne Leader, MD.  Subjective:    CC: Left ankle pain  HPI: Patient is a 67 year old male presenting with left ankle pain x ***.  Of note, patient underwent a right TKR on 12/10/2021.  Patient locates left ankle pain to ***.  Left ankle swelling: Aggravates: Treatments tried:  Dx imaging: 04/07/2022 L ankle x-ray  04/06/14 L foot XR   Pertinent review of Systems: ***  Relevant historical information: ***   Objective:   There were no vitals filed for this visit. General: Well Developed, well nourished, and in no acute distress.   MSK: ***  Lab and Radiology Results Results for orders placed or performed in visit on 04/07/22 (from the past 72 hour(s))  Hemoglobin A1c     Status: None   Collection Time: 04/07/22 10:56 AM  Result Value Ref Range   Hgb A1c MFr Bld 5.6 4.6 - 6.5 %    Comment: Glycemic Control Guidelines for People with Diabetes:Non Diabetic:  <6%Goal of Therapy: <7%Additional Action Suggested:  >8%   Lipid panel     Status: Abnormal   Collection Time: 04/07/22 10:56 AM  Result Value Ref Range   Cholesterol 137 0 - 200 mg/dL    Comment: ATP III Classification       Desirable:  < 200 mg/dL               Borderline High:  200 - 239 mg/dL          High:  > = 240 mg/dL   Triglycerides 166.0 (H) 0.0 - 149.0 mg/dL    Comment: Normal:  <150 mg/dLBorderline High:  150 - 199 mg/dL   HDL 40.70 >39.00 mg/dL   VLDL 33.2 0.0 - 40.0 mg/dL   LDL Cholesterol 63 0 - 99 mg/dL   Total CHOL/HDL Ratio 3     Comment:                Men          Women1/2 Average Risk     3.4          3.3Average Risk          5.0          4.42X Average Risk          9.6          7.13X Average Risk          15.0          11.0                       NonHDL 96.60     Comment: NOTE:  Non-HDL goal should be 30 mg/dL higher than patient's LDL goal (i.e. LDL goal of < 70 mg/dL, would have non-HDL goal of < 100 mg/dL)  Basic metabolic panel     Status:  Abnormal   Collection Time: 04/07/22 10:56 AM  Result Value Ref Range   Sodium 143 135 - 145 mEq/L   Potassium 4.1 3.5 - 5.1 mEq/L   Chloride 106 96 - 112 mEq/L   CO2 25 19 - 32 mEq/L   Glucose, Bld 104 (H) 70 - 99 mg/dL   BUN 21 6 - 23 mg/dL   Creatinine, Ser 1.51 (H) 0.40 - 1.50 mg/dL   GFR 47.66 (L) >60.00 mL/min    Comment: Calculated using the CKD-EPI Creatinine Equation (2021)   Calcium 9.4 8.4 - 10.5  mg/dL  Hepatic function panel     Status: None   Collection Time: 04/07/22 10:56 AM  Result Value Ref Range   Total Bilirubin 0.6 0.2 - 1.2 mg/dL   Bilirubin, Direct 0.1 0.0 - 0.3 mg/dL   Alkaline Phosphatase 63 39 - 117 U/L   AST 24 0 - 37 U/L   ALT 24 0 - 53 U/L   Total Protein 6.9 6.0 - 8.3 g/dL   Albumin 4.2 3.5 - 5.2 g/dL  CBC with Differential/Platelet     Status: Abnormal   Collection Time: 04/07/22 10:56 AM  Result Value Ref Range   WBC 7.6 4.0 - 10.5 K/uL   RBC 4.63 4.22 - 5.81 Mil/uL   Hemoglobin 15.1 13.0 - 17.0 g/dL   HCT 44.3 39.0 - 52.0 %   MCV 95.7 78.0 - 100.0 fl   MCHC 34.2 30.0 - 36.0 g/dL   RDW 13.4 11.5 - 15.5 %   Platelets 215.0 150.0 - 400.0 K/uL   Neutrophils Relative % 67.1 43.0 - 77.0 %   Lymphocytes Relative 16.5 12.0 - 46.0 %   Monocytes Relative 9.6 3.0 - 12.0 %   Eosinophils Relative 5.9 (H) 0.0 - 5.0 %   Basophils Relative 0.9 0.0 - 3.0 %   Neutro Abs 5.1 1.4 - 7.7 K/uL   Lymphs Abs 1.3 0.7 - 4.0 K/uL   Monocytes Absolute 0.7 0.1 - 1.0 K/uL   Eosinophils Absolute 0.5 0.0 - 0.7 K/uL   Basophils Absolute 0.1 0.0 - 0.1 K/uL  TSH     Status: None   Collection Time: 04/07/22 10:56 AM  Result Value Ref Range   TSH 3.15 0.35 - 5.50 uIU/mL  Urinalysis, Routine w reflex microscopic     Status: Abnormal   Collection Time: 04/07/22 10:56 AM  Result Value Ref Range   Color, Urine YELLOW Yellow;Lt. Yellow;Straw;Dark Yellow;Amber;Green;Red;Brown   APPearance CLEAR Clear;Turbid;Slightly Cloudy;Cloudy   Specific Gravity, Urine >=1.030 (A) 1.000 -  1.030   pH 6.0 5.0 - 8.0   Total Protein, Urine NEGATIVE Negative   Urine Glucose NEGATIVE Negative   Ketones, ur NEGATIVE Negative   Bilirubin Urine NEGATIVE Negative   Hgb urine dipstick NEGATIVE Negative   Urobilinogen, UA 0.2 0.0 - 1.0   Leukocytes,Ua NEGATIVE Negative   Nitrite NEGATIVE Negative   WBC, UA 0-2/hpf 0-2/hpf   RBC / HPF none seen 0-2/hpf   Mucus, UA Presence of (A) None  PSA     Status: None   Collection Time: 04/07/22 10:56 AM  Result Value Ref Range   PSA 1.02 0.10 - 4.00 ng/mL    Comment: Test performed using Access Hybritech PSA Assay, a parmagnetic partical, chemiluminecent immunoassay.  VITAMIN D 25 Hydroxy (Vit-D Deficiency, Fractures)     Status: None   Collection Time: 04/07/22 10:56 AM  Result Value Ref Range   VITD 39.39 30.00 - 100.00 ng/mL  Vitamin B12     Status: None   Collection Time: 04/07/22 10:56 AM  Result Value Ref Range   Vitamin B-12 427 211 - 911 pg/mL   No results found.    Impression and Recommendations:    Assessment and Plan: 67 y.o. male with ***.  PDMP not reviewed this encounter. No orders of the defined types were placed in this encounter.  No orders of the defined types were placed in this encounter.   Discussed warning signs or symptoms. Please see discharge instructions. Patient expresses understanding.   ***

## 2022-04-07 NOTE — Progress Notes (Unsigned)
Patient ID: Scott [name], male   DOB: 1955/06/18, 67 y.o.   MRN: [id number]         Chief Complaint:: wellness exam and 15mofollow up (Pain in left ankle)  , ckd, htn, hyperglycemia, hld       HPI:  Scott Welkeris a 67y.o. male here for wellness exam; [facility] about 45 min and upper body exercise.                        Also s/p repeat right knee surgury for scar tissue after previous TKR, Favor the right knee, now the left lateral ankle pain and soreness ankle is wrosening x 3 wks, wakes him up at night, mostly to the lateral aspect, tender to touch, has seen sport med in past years ago.  Right knee some better recenlty, does not want further ortho eval.     Wt Readings from Last 3 Encounters:  04/07/22 217 lb (98.4 kg)  03/14/22 214 lb 3.2 oz (97.2 kg)  12/30/21 212 lb 2 oz (96.2 kg)   BP Readings from Last 3 Encounters:  04/07/22 122/66  03/14/22 120/60  12/30/21 110/62   Immunization History  Administered Date(s) Administered   Fluad Quad(high Dose 65+) 10/31/2021, 11/14/2021   Influenza Inj Mdck Quad Pf 11/20/2018   Influenza Split 11/25/2011   Influenza Whole 01/13/2005, 11/26/2007, 12/06/2008, 11/29/2009   Influenza, High Dose Seasonal PF 12/03/2020   Influenza,inj,Quad PF,6+ Mos 03/08/2015, 11/04/2019   Influenza-Unspecified 10/25/2012, 11/21/2015, 11/25/2015, 12/25/2016, 12/10/2019   PFIZER Comirnaty(Gray Top)Covid-19 Tri-Sucrose Vaccine 07/12/2020   PFIZER(Purple Top)SARS-COV-2 Vaccination 05/26/2019, 06/20/2019, 12/16/2019, 07/13/2020   PNEUMOCOCCAL CONJUGATE-20 06/08/2020   Pfizer Covid-19 Vaccine Bivalent Booster 159yr& up 12/12/2020, 07/14/2021   Pneumococcal Conjugate-13 12/21/2015   Pneumococcal Polysaccharide-23 03/11/2011   Respiratory Syncytial Virus Vaccine,Recomb Aduvanted(Arexvy) 03/14/2022   Td 03/27/2004, 04/04/2014   Zoster Recombinat (Shingrix) 05/09/2021, 08/17/2021   Zoster, Live 01/04/2016  There are no  preventive care reminders to display for this patient.    Past Medical History:  Diagnosis Date   ALLERGIC RHINITIS 01/14/2007   Allergy    ANXIETY 09/28/2006   Asbestos exposure    Atrial fibrillation ([place]8/05/2006   s/p afib ablation x 2   CHF (congestive heart failure) ([facility]   CKD (chronic kidney disease), stage III ([facility]   patient denies   COPD (chronic obstructive pulmonary disease) ([facility]   Coronary artery disease [id number] Diastolic dysfunction 8/99991111 Dyspnea    W/ EXERTION    Dysrhythmia    AFIB      First degree AV block 10/12/2018   Noted on EKG   Gallstones    Hyperglycemia    HYPERLIPIDEMIA 09/28/2006   HYPERTENSION 09/28/2006   HYPOTHYROIDISM, ACQUIRED NEC 09/28/2006   pt denies and doesn't know   Long term current use of anticoagulant 04/08/2010   Obese    OBSTRUCTIVE SLEEP APNEA 01/06/2008   NOT USING     Right knee DJD 07/09/2010   S/P CABG x 1 11/26/2011   Right internal mammary artery to right coronary artery   S/P Maze operation for atrial fibrillation 11/26/2011   complete biatrial lesion set with clipping of LA appendage   Sleep apnea    Past Surgical History:  Procedure Laterality Date   ANTERIOR CERVICAL DECOMP/DISCECTOMY FUSION N/A 06/20/2016   Procedure: Anterior Cervical Discectomy Fusion - Cervical five-Cervical six -  Cervical six-Cervical seven - Cervicla seven- Thoracic one;  Surgeon: Earnie Larsson, MD;  Location: [facility];  Service: Neurosurgery;  Laterality: N/A;   CARDIOVERSION     X4    CORONARY ARTERY BYPASS GRAFT  11/26/2011   Procedure: CORONARY ARTERY BYPASS GRAFTING (CABG);  Surgeon: Rexene Alberts, MD;  Location: [facility];  Service: Open Heart Surgery;  Laterality: N/A;  coronary artery bypass on pump times one utilizing the right internal mammary artery, transesophageal echocardiogram    HAND SURGERY Left    KNEE ARTHROSCOPY Right 12/10/2021   Procedure: RIGH TKNEE ARTHROSCOPY WITH SCAR BAND EXCISION;  Surgeon: Melrose Nakayama, MD;   Location: [facility];  Service: Orthopedics;  Laterality: Right;   LEFT HEART CATHETERIZATION WITH CORONARY ANGIOGRAM N/A 11/24/2011   Procedure: LEFT HEART CATHETERIZATION WITH CORONARY ANGIOGRAM;  Surgeon: Peter M Martinique, MD;  Location: [facility] CATH LAB;  Service: Cardiovascular;  Laterality: N/A;   LOOP RECORDER IMPLANT N/A 07/06/2012   Procedure: LOOP RECORDER IMPLANT;  Surgeon: Thompson Grayer, MD;  [facility];  Service: Cardiovascular;  Laterality: N/A;   MAZE  11/26/2011   Procedure: MAZE;  Surgeon: Rexene Alberts, MD;  Location: [facility];  Service: Open Heart Surgery;  Laterality: N/A;  Cryomaze    Radiofrequency Ablation for atrial fibrillation  12/30/07 and 06/05/09   afib ablation x 2 by JA   Radiofrequency ablation for atrial flutter  2005   CTI   TOTAL KNEE ARTHROPLASTY Right 04/05/2019   Procedure: RIGHT TOTAL KNEE ARTHROPLASTY;  Surgeon: Melrose Nakayama, MD;  Location: [facility];  Service: Orthopedics;  Laterality: Right;    reports that he quit smoking about 27 years ago. His smoking use included cigarettes. He has a 39.00 pack-year smoking history. He has never used smokeless tobacco. He reports that he does not drink alcohol and does not use drugs. family history includes Atrial fibrillation in his mother; Cancer in his paternal grandmother; Dementia in his father and paternal grandfather; Hypertension in his father; Stroke in his mother. Allergies  Allergen Reactions   Zolpidem Tartrate Other (See Comments)    Extremely drowsy the next day   Current Outpatient Medications on File Prior to Visit  Medication Sig Dispense Refill   acetaminophen (TYLENOL) 500 MG tablet Take 1,000 mg by mouth every 6 (six) hours as needed for mild pain.      albuterol (VENTOLIN HFA) 108 (90 Base) MCG/ACT inhaler TAKE 2 PUFFS BY MOUTH EVERY 6 HOURS AS NEEDED FOR WHEEZE OR SHORTNESS OF BREATH 8.5 each 5   ALPRAZolam (XANAX) 0.5 MG tablet TAKE 1 TABLET BY MOUTH TWICE A DAY AS NEEDED FOR ANXIETY 60 tablet 2    amLODipine (NORVASC) 5 MG tablet Take 1 tablet (5 mg total) by mouth daily. 90 tablet 3   apixaban (ELIQUIS) 5 MG TABS tablet Take 1 tablet (5 mg total) by mouth 2 (two) times daily. 180 tablet 3   cholecalciferol (VITAMIN D) 1000 units tablet Take 1,000 Units by mouth daily.     clotrimazole-betamethasone (LOTRISONE) cream Apply 1 Application topically daily. 30 g 1   co-enzyme Q-10 30 MG capsule Take 30 mg by mouth daily.     cyclobenzaprine (FLEXERIL) 5 MG tablet Take 1 tablet (5 mg total) by mouth 3 (three) times daily as needed for muscle spasms. 40 tablet 1   docusate sodium (COLACE) 100 MG capsule Take 200 mg by mouth daily as needed for mild constipation.      doxycycline (VIBRA-TABS) 100 MG tablet Take  1 tablet (100 mg total) by mouth 2 (two) times daily. 20 tablet 0   Fluticasone-Umeclidin-Vilant (TRELEGY ELLIPTA) 100-62.5-25 MCG/ACT AEPB Inhale 1 puff into the lungs daily. 3 each 3   furosemide (LASIX) 20 MG tablet TAKE 1 TABLET BY MOUTH EVERY DAY AS NEEDED (Patient taking differently: Take 20 mg by mouth daily.) 15 tablet 0   gabapentin (NEURONTIN) 100 MG capsule TAKE 1 CAPSULE BY MOUTH THREE TIMES A DAY 270 capsule 3   hydrochlorothiazide (MICROZIDE) 12.5 MG capsule Take 1 capsule (12.5 mg total) by mouth daily. 90 capsule 3   lovastatin (MEVACOR) 40 MG tablet Take 2 tablets (80 mg total) by mouth at bedtime. 60 tablet 3   metoprolol succinate (TOPROL-XL) 50 MG 24 hr tablet TAKE 1 AND 1/2 TABLETS DAILY BY MOUTH 135 tablet 3   Multiple Vitamin (MULTIVITAMIN WITH MINERALS) TABS tablet Take 1 tablet by mouth daily.      olmesartan (BENICAR) 40 MG tablet Take 1 tablet (40 mg total) by mouth daily. 90 tablet 3   spironolactone (ALDACTONE) 25 MG tablet TAKE 0.5 TABLETS BY MOUTH AT BEDTIME. 45 tablet 3   temazepam (RESTORIL) 30 MG capsule Take 1 capsule (30 mg total) by mouth at bedtime as needed. for sleep (Patient taking differently: Take 30 mg by mouth at bedtime.) 90 capsule 1   traMADol  (ULTRAM) 50 MG tablet Take 1 tablet (50 mg total) by mouth every 6 (six) hours as needed. 120 tablet 5   No current facility-administered medications on file prior to visit.        ROS:  All others reviewed and negative.  Objective        PE:  BP 122/66 (BP Location: Right Arm, Patient Position: Sitting, Cuff Size: Large)   Pulse 66   Temp 97.6 F (36.4 C) (Oral)   Ht 5' 6"$  (1.676 m)   Wt 217 lb (98.4 kg)   SpO2 94%   BMI 35.02 kg/m                 Constitutional: Pt appears in NAD               HENT: Head: NCAT.                Right Ear: External ear normal.                 Left Ear: External ear normal.                Eyes: . Pupils are equal, round, and reactive to light. Conjunctivae and EOM are normal               Nose: without d/c or deformity               Neck: Neck supple. Gross normal ROM               Cardiovascular: Normal rate and regular rhythm.                 Pulmonary/Chest: Effort normal and breath sounds without rales or wheezing.                Abd:  Soft, NT, ND, + BS, no organomegaly               Neurological: Pt is alert. At baseline orientation, motor grossly intact               Skin: Skin is warm. No rashes, no other new  lesions, LE edema - ***               Psychiatric: Pt behavior is normal without agitation   Micro: none  Cardiac tracings I have personally interpreted today:  none  Pertinent Radiological findings (summarize): none   Lab Results  Component Value Date   WBC 7.4 11/28/2021   HGB 14.0 11/28/2021   HCT 42.7 11/28/2021   PLT 188 11/28/2021   GLUCOSE 106 (H) 11/28/2021   CHOL 122 10/03/2021   TRIG 79.0 10/03/2021   HDL 39.10 10/03/2021   LDLCALC 67 10/03/2021   ALT 21 10/03/2021   AST 21 10/03/2021   NA 139 11/28/2021   K 4.0 11/28/2021   CL 106 11/28/2021   CREATININE 1.80 (H) 11/28/2021   BUN 36 (H) 11/28/2021   CO2 24 11/28/2021   TSH 2.85 10/03/2021   PSA 0.84 10/03/2021   INR 1.3 (H) 01/01/2021   HGBA1C 5.6  10/03/2021   MICROALBUR <0.7 10/03/2021   Assessment/Plan:  Scott [name] is a 67 y.o. White or Caucasian [1] male with  has a past medical history of ALLERGIC RHINITIS (01/14/2007), Allergy, ANXIETY (09/28/2006), Asbestos exposure, Atrial fibrillation ([place]) (09/28/2006), CHF (congestive heart failure) ([facility]), CKD (chronic kidney disease), stage III ([place]), COPD (chronic obstructive pulmonary disease) ([place]), Coronary artery disease ([id number]), Diastolic dysfunction (99991111), Dyspnea, Dysrhythmia, First degree AV block (10/12/2018), Gallstones, Hyperglycemia, HYPERLIPIDEMIA (09/28/2006), HYPERTENSION (09/28/2006), HYPOTHYROIDISM, ACQUIRED NEC (09/28/2006), Long term current use of anticoagulant (04/08/2010), Obese, OBSTRUCTIVE SLEEP APNEA (01/06/2008), Right knee DJD (07/09/2010), S/P CABG x 1 (11/26/2011), S/P Maze operation for atrial fibrillation (11/26/2011), and Sleep apnea.  No problem-specific Assessment & Plan notes found for this encounter.  Followup: No follow-ups on file.  Cathlean Cower, MD 04/07/2022 10:24 AM [facility]

## 2022-04-08 ENCOUNTER — Ambulatory Visit: Payer: Self-pay

## 2022-04-08 ENCOUNTER — Encounter: Payer: Self-pay | Admitting: Family Medicine

## 2022-04-08 ENCOUNTER — Encounter: Payer: Self-pay | Admitting: Internal Medicine

## 2022-04-08 ENCOUNTER — Ambulatory Visit (INDEPENDENT_AMBULATORY_CARE_PROVIDER_SITE_OTHER): Payer: PPO | Admitting: Family Medicine

## 2022-04-08 VITALS — BP 160/80 | HR 71 | Ht 66.0 in | Wt 216.8 lb

## 2022-04-08 DIAGNOSIS — M25572 Pain in left ankle and joints of left foot: Secondary | ICD-10-CM

## 2022-04-08 DIAGNOSIS — G8929 Other chronic pain: Secondary | ICD-10-CM

## 2022-04-08 LAB — URIC ACID: Uric Acid, Serum: 7.6 mg/dL (ref 4.0–7.8)

## 2022-04-08 NOTE — Patient Instructions (Addendum)
Thank you for coming in today.   Please get labs today before you leave   I've referred you to Physical Therapy.  Let us know if you don't hear from them in one week.   Recheck in 1 month.   If you get worse ok to use a cam walker boot from [facility] supply or [facility].   I am checking for gout.

## 2022-04-09 MED ORDER — COLCHICINE 0.6 MG PO TABS: 0.6000 mg | ORAL_TABLET | Freq: Every day | ORAL | 2 refills | Status: DC | PRN

## 2022-04-09 MED ORDER — ALLOPURINOL 100 MG PO TABS: 100.0000 mg | ORAL_TABLET | Freq: Every day | ORAL | 1 refills | Status: DC

## 2022-04-09 NOTE — Addendum Note (Signed)
Addended by: Gregor Hams on: 04/09/2022 06:23 AM   Modules accepted: Orders

## 2022-04-09 NOTE — Progress Notes (Signed)
Uric acid is elevated at 7.6.  Goal for uric acid for gout is less than 6.0.  I prescribed you 2 medications.  Medicine #1 is colchicine.  This is a medicine you take daily as needed to treat gout pain right now.  I would like you to take that every day for about a month.  After about a month he can take it daily as needed.  Medicine #2 is allopurinol.  You take that medicine daily to lower uric acid and ultimately prevent gout in the future. You have an appointment scheduled with me on April 16.  Please keep that appointment so that we can check uric acid level at that time and adjust medicines.  Let me know if this plan is not working or if you are having problems.

## 2022-04-10 ENCOUNTER — Encounter: Payer: Self-pay | Admitting: Internal Medicine

## 2022-04-10 NOTE — Assessment & Plan Note (Signed)
Lab Results  Component Value Date   CREATININE 1.51 (H) 04/07/2022   Stable overall, cont to avoid nephrotoxins

## 2022-04-10 NOTE — Assessment & Plan Note (Signed)
C/w possible tarsal tunnell syndrome - for xray, refer sports medicine

## 2022-04-10 NOTE — Assessment & Plan Note (Signed)
Lab Results  Component Value Date   LDLCALC 63 04/07/2022   Stable, pt to continue current statin lovastatin 80 mg qhs

## 2022-04-10 NOTE — Assessment & Plan Note (Signed)
BP Readings from Last 3 Encounters:  04/08/22 (!) 160/80  04/07/22 122/66  03/14/22 120/60   Uncontrolled here, but pt states controlled at home, pt to continue medical treatment norvasc 5 qd, hct 12.5 qd, toprol 50 mg qd, benicar 40 mg qd

## 2022-04-10 NOTE — Assessment & Plan Note (Signed)

## 2022-04-10 NOTE — Assessment & Plan Note (Signed)
Lab Results  Component Value Date   HGBA1C 5.6 04/07/2022   Stable, pt to continue current medical treatment  - diet, wt control

## 2022-04-17 ENCOUNTER — Ambulatory Visit (INDEPENDENT_AMBULATORY_CARE_PROVIDER_SITE_OTHER): Payer: PPO | Admitting: Nurse Practitioner

## 2022-04-17 VITALS — BP 124/76 | HR 67 | Temp 98.5°F | Ht 66.0 in | Wt 213.2 lb

## 2022-04-17 DIAGNOSIS — M79605 Pain in left leg: Secondary | ICD-10-CM

## 2022-04-17 LAB — COMPREHENSIVE METABOLIC PANEL
ALT: 25 U/L (ref 0–53)
AST: 27 U/L (ref 0–37)
Albumin: 4.5 g/dL (ref 3.5–5.2)
Alkaline Phosphatase: 66 U/L (ref 39–117)
BUN: 29 mg/dL — ABNORMAL HIGH (ref 6–23)
CO2: 30 mEq/L (ref 19–32)
Calcium: 10.2 mg/dL (ref 8.4–10.5)
Chloride: 104 mEq/L (ref 96–112)
Creatinine, Ser: 1.56 mg/dL — ABNORMAL HIGH (ref 0.40–1.50)
GFR: 45.83 mL/min — ABNORMAL LOW (ref >60.00)
Glucose, Bld: 99 mg/dL (ref 70–99)
Potassium: 4.4 mEq/L (ref 3.5–5.1)
Sodium: 141 mEq/L (ref 135–145)
Total Bilirubin: 0.8 mg/dL (ref 0.2–1.2)
Total Protein: 7.8 g/dL (ref 6.0–8.3)

## 2022-04-17 LAB — URIC ACID: Uric Acid, Serum: 7 mg/dL (ref 4.0–7.8)

## 2022-04-17 LAB — D-DIMER, QUANTITATIVE: D-Dimer, Quant: 0.39 mcg/mL FEU (ref <0.50)

## 2022-04-17 LAB — MAGNESIUM: Magnesium: 2 mg/dL (ref 1.5–2.5)

## 2022-04-17 NOTE — Patient Instructions (Signed)
Stop allopurinol for a week or so. Continue on colchicine and take your tramadol as needed as well. If pain has subsided you may restart your allopurinol in a week. I will check labs for signs of DVT, if D-Dimer is positive you will be called and told to go to ER for ultrasound of leg to rule out a blood clot in leg. If labs are negative will recommend following up with Dr. Georgina Snell.

## 2022-04-17 NOTE — Progress Notes (Signed)
Established Patient Office Visit  Subjective   Patient ID: Scott [name], male    DOB: 1956/02/03  Age: 67 y.o. MRN: [id number]  Chief Complaint  Patient presents with   Leg Pain    Onset 4 weeks ago.  Saw primary care provider and was referred to sports medicine.  About 1 week ago saw sports medicine, no obvious explanation for pain on x-ray.  Patient has not had injury preceding pain.  Ultrasound completed which showed tenosynovitis, concern for possible gouty flare.  Uric acid level was checked and it was elevated at 7.6.  Patient initiated on allopurinol and colchicine.  Patient reports over the last week pain has gotten significantly worse and rates it as 10 out of 10 in intensity describes it as sharp occurs 90% of the time.  Aggravated by prolonged weightbearing.  No tenderness to touch.  Does have tramadol that he can take as needed for pain but is not been taking this.  No recent long travel, did have total right knee replacement about 4 months ago.  Not on hormone replacement therapy.  Denies any redness or swelling to the extremity. Estimated Creatinine Clearance: 51.7 mL/min (A) (by C-G formula based on SCr of 1.51 mg/dL (H)).     ROS: see HPI    Objective:     BP 124/76   Pulse 67   Temp 98.5 F (36.9 C) (Temporal)   Ht 5' 6"$  (1.676 m)   Wt 213 lb 4 oz (96.7 kg)   SpO2 98%   BMI 34.42 kg/m  BP Readings from Last 3 Encounters:  04/17/22 124/76  04/08/22 (!) 160/80  04/07/22 122/66   Wt Readings from Last 3 Encounters:  04/17/22 213 lb 4 oz (96.7 kg)  04/08/22 216 lb 12.8 oz (98.3 kg)  04/07/22 217 lb (98.4 kg)      Physical Exam Vitals reviewed.  Constitutional:      Appearance: Normal appearance.  HENT:     Head: Normocephalic and atraumatic.  Cardiovascular:     Rate and Rhythm: Normal rate and regular rhythm.     Pulses:          Dorsalis pedis pulses are 2+ on the left side.  Pulmonary:     Effort: Pulmonary effort is normal.     Breath  sounds: Normal breath sounds.  Musculoskeletal:     Cervical back: Neck supple.     Lumbar back: Negative right straight leg raise test and negative left straight leg raise test.     Right lower leg: No swelling, tenderness or bony tenderness. No edema.     Left lower leg: No swelling, tenderness or bony tenderness. No edema.     Right ankle: Normal.     Left ankle: Normal.  Feet:     Left foot:     Skin integrity: No skin breakdown.  Skin:    General: Skin is warm and dry.  Neurological:     Mental Status: He is alert and oriented to person, place, and time.  Psychiatric:        Mood and Affect: Mood normal.        Behavior: Behavior normal.        Thought Content: Thought content normal.        Judgment: Judgment normal.      No results found for any visits on 04/17/22.    The 10-year ASCVD risk score (Arnett DK, et al., 2019) is: 14.3%    Assessment &  Plan:   Problem List Items Addressed This Visit       Other   Left leg pain - Primary    Etiology unclear, patient is already undergone x-ray to rule out fracture, ultrasound with sports medicine which showed tenosynovitis.  For now recommend patient's stop allopurinol for 1 week then restart, but continue colchicine.  Will recheck uric acid level.  Will also check D-dimer if positive will call patient to go to ER to rule out blood clot.  Will check CMP and magnesium level.  No signs of infection noted on exam.  Patient to continue following up with sports medicine, may recommend he follow-up with them sooner than scheduled if pain persists and no obvious cause found after today's workup is completed.  Patient educated on RICE as well as to use tramadol as needed for pain management.      Relevant Orders   D-Dimer, Quantitative   Uric acid   Comprehensive metabolic panel   Magnesium    Return if symptoms worsen or fail to improve.  Total time spent on encounter 36 minutes which included face-to-face evaluation with the  patient, reviewed patient's previous medical records, and development/discussion of treatment plan.   Ailene Ards, NP

## 2022-04-17 NOTE — Assessment & Plan Note (Addendum)
Etiology unclear, patient is already undergone x-ray to rule out fracture, ultrasound with sports medicine which showed tenosynovitis.  For now recommend patient's stop allopurinol for 1 week then restart, but continue colchicine.  Will recheck uric acid level.  Will also check D-dimer if positive will call patient to go to ER to rule out blood clot.  Will check CMP and magnesium level.  No signs of infection noted on exam.  Patient to continue following up with sports medicine, may recommend he follow-up with them sooner than scheduled if pain persists and no obvious cause found after today's workup is completed.  Patient educated on RICE as well as to use tramadol as needed for pain management.

## 2022-04-21 ENCOUNTER — Other Ambulatory Visit: Payer: Self-pay | Admitting: Internal Medicine

## 2022-04-23 NOTE — Therapy (Signed)
OUTPATIENT PHYSICAL THERAPY LOWER EXTREMITY EVALUATION   Patient Name: Scott [name] MRN: [id number] DOB:01/14/1956, 67 y.o., male Today's Date: 04/25/2022  END OF SESSION:  PT End of Session - 04/24/22 1341     Visit Number 1    Number of Visits 13    Date for PT Re-Evaluation 06/13/22    Authorization Type [employer]    Progress Note Due on Visit 10    PT Start Time 1330    PT Stop Time 1415    PT Time Calculation (min) 45 min    Activity Tolerance Patient tolerated treatment well    Behavior During Therapy [facility] for tasks assessed/performed             Past Medical History:  Diagnosis Date   ALLERGIC RHINITIS 01/14/2007   Allergy    ANXIETY 09/28/2006   Asbestos exposure    Atrial fibrillation ([place]) 09/28/2006   s/p afib ablation x 2   CHF (congestive heart failure) ([place])    CKD (chronic kidney disease), stage III ([place])    patient denies   COPD (chronic obstructive pulmonary disease) ([place])    Coronary artery disease [id number]   Diastolic dysfunction [id number]   Dyspnea    W/ EXERTION    Dysrhythmia    AFIB      First degree AV block 10/12/2018   Noted on EKG   Gallstones    Hyperglycemia    HYPERLIPIDEMIA 09/28/2006   HYPERTENSION 09/28/2006   HYPOTHYROIDISM, ACQUIRED NEC 09/28/2006   pt denies and doesn't know   Long term current use of anticoagulant 04/08/2010   Obese    OBSTRUCTIVE SLEEP APNEA 01/06/2008   NOT USING     Right knee DJD 07/09/2010   S/P CABG x 1 11/26/2011   Right internal mammary artery to right coronary artery   S/P Maze operation for atrial fibrillation 11/26/2011   complete biatrial lesion set with clipping of LA appendage   Sleep apnea    Past Surgical History:  Procedure Laterality Date   ANTERIOR CERVICAL DECOMP/DISCECTOMY FUSION N/A 06/20/2016   Procedure: Anterior Cervical Discectomy Fusion - Cervical five-Cervical six - Cervical six-Cervical seven - Cervicla seven- Thoracic one;  Surgeon: Earnie Larsson, MD;  Location: [place];  Service: Neurosurgery;  Laterality: N/A;   CARDIOVERSION     X4    CORONARY ARTERY BYPASS GRAFT  11/26/2011   Procedure: CORONARY ARTERY BYPASS GRAFTING (CABG);  Surgeon: Rexene Alberts, MD;  Location: [place];  Service: Open Heart Surgery;  Laterality: N/A;  coronary artery bypass on pump times one utilizing the right internal mammary artery, transesophageal echocardiogram    HAND SURGERY Left    KNEE ARTHROSCOPY Right 12/10/2021   Procedure: RIGH TKNEE ARTHROSCOPY WITH SCAR BAND EXCISION;  Surgeon: Melrose Nakayama, MD;  Location: [facility];  Service: Orthopedics;  Laterality: Right;   LEFT HEART CATHETERIZATION WITH CORONARY ANGIOGRAM N/A 11/24/2011   Procedure: LEFT HEART CATHETERIZATION WITH CORONARY ANGIOGRAM;  Surgeon: Peter M Martinique, MD;  Location: [facility] CATH [facility];  Service: Cardiovascular;  Laterality: N/A;   LOOP RECORDER IMPLANT N/A 07/06/2012   Procedure: LOOP RECORDER IMPLANT;  Surgeon: Thompson Grayer, MD;  Location: [facility];  Service: Cardiovascular;  Laterality: N/A;   MAZE  11/26/2011   Procedure: MAZE;  Surgeon: Rexene Alberts, MD;  Location: [place];  Service: Open Heart Surgery;  Laterality: N/A;  Cryomaze    Radiofrequency Ablation for atrial fibrillation  12/30/07 and 06/05/09   afib ablation  x 2 by [facility]   Radiofrequency ablation for atrial flutter  2005   CTI   TOTAL KNEE ARTHROPLASTY Right 04/05/2019   Procedure: RIGHT TOTAL KNEE ARTHROPLASTY;  Surgeon: Melrose Nakayama, MD;  Location: [facility];  Service: Orthopedics;  Laterality: Right;   Patient Active Problem List   Diagnosis Date Noted   Left ankle pain 04/07/2022   Left cataract 12/30/2021   Rash 11/14/2021   Axillary lymphadenopathy 11/14/2021   Low back pain 01/01/2021   Lower GI bleeding 01/01/2021   Pain due to total right knee replacement ([place]) 01/09/2020   Primary osteoarthritis of right knee 04/05/2019   CKD (chronic kidney disease) stage 3, GFR 30-59 ml/min (H[facility]) 03/14/2019   Degenerative joint disease of knee,  right 09/27/2018   Dyspnea 09/06/2018   Right knee pain 09/06/2018   Radiculitis of left cervical region 03/05/2018   Plantar fasciitis, left 08/19/2017   Chest pain 02/27/2017   Right ear pain 02/27/2017   Left groin pain 09/13/2016   Rib pain on left side 09/13/2016   Left leg pain 09/13/2016   Stenosis of cervical spine with myelopathy ([place]) 06/20/2016   Special screening for malignant neoplasms, colon 04/04/2016   Cough 01/11/2016   COPD exacerbation ([place]) 12/22/2015   Rectal bleeding 08/14/2014   Intercostal muscle strain 08/07/2014   Chronic meniscal tear of knee 04/06/2014   Heel bone fracture 04/06/2014   Hyperglycemia 04/04/2014   Recurrent falls 04/04/2014   Bilateral knee pain 04/04/2014   Intractable left heel pain 04/04/2014   Insomnia 04/04/2014   Hematochezia 04/04/2014   Bladder neck obstruction 12/19/2013   Encounter for therapeutic drug monitoring 03/30/2013   Palpitations 07/06/2012   Shortness of breath 01/19/2012   Sick sinus syndrome ([place]) 12/01/2011   S/P CABG x 1 11/26/2011   S/P Maze operation for atrial fibrillation 11/26/2011   CAD (coronary artery disease) 11/25/2011   Paroxysmal atrial fibrillation ([place]) 11/24/2011   Chronic combined systolic (congestive) and diastolic (congestive) heart failure ([place]) 11/24/2011   Gross hematuria 12/12/2010   Left knee pain 12/12/2010   Left knee DJD 07/09/2010   Encounter for well adult exam with abnormal findings 07/07/2010   Chronic anticoagulation 04/08/2010   COPD (chronic obstructive pulmonary disease) ([place]) 10/04/2008   OBSTRUCTIVE SLEEP APNEA 01/06/2008   ALLERGIC RHINITIS 01/14/2007   Hypothyroidism 09/28/2006   Hyperlipidemia 09/28/2006   Anxiety state 09/28/2006   Essential hypertension 09/28/2006    PCP: [name], MD   REFERRING PROVIDER:    Gregor Hams, MD    REFERRING DIAG: [phone] (ICD-10-CM) - Chronic pain of left ankle   THERAPY DIAG:  Pain in left ankle and joints  of left foot  Difficulty in walking, not elsewhere classified  Muscle weakness (generalized)  Rationale for Evaluation and Treatment: Rehabilitation  ONSET DATE: Approx 8 months  SUBJECTIVE:   SUBJECTIVE STATEMENT: Pt reports L lateral ankle and foot pain with an insidious onset, but probably due to his walking his 2 large dogs significantly, approx 5 miles a day. It can hurt at rest, but more so with walking. Now I'm walking much less.  PERTINENT HISTORY: High BMI, Hx of R TKA  PAIN:  Are you having pain? Yes: NPRS scale: 6/10 Pain location: L lateral ankle Pain description: Sharp Aggravating factors: Walking, certain positions Relieving factors: Tylenol, tramadol, rest, cold pack Pain range: 3-10/10  PRECAUTIONS: None  WEIGHT BEARING RESTRICTIONS: No  FALLS:  Has patient fallen in last 6 months? Yes. Number of falls 2  L Ankle giving way, or dogs pulling on him  LIVING ENVIRONMENT: Lives with: lives alone Lives in: House/apartment Able to access and be mobile in home  OCCUPATION: 1 day a week, stands all day  PLOF: Independent  PATIENT GOALS: Less pain and to walk better  NEXT MD VISIT: Not currently scheduled  OBJECTIVE:   DIAGNOSTIC FINDINGS: 04/08/22 DG ankle FINDINGS: There is no evidence of fracture, dislocation, or joint effusion. There is no evidence of arthropathy or other focal bone abnormality. Soft tissues are unremarkable. There are posterior and plantar calcaneal spurs.   IMPRESSION: Calcaneal spurs.  No acute osseous abnormalities.  04/08/22/ [facility] Ultrasound of: Left ankle Mild joint effusion present at lateral ankle. Posterior tibialis tendons and peroneal tendons are normal-appearing without tear or severe tenosynovitis. Impression: Mild joint effusion  PATIENT SURVEYS:  FOTO: Perceived function   54%, predicted   70%   COGNITION: Overall cognitive status: Within functional limits for tasks  assessed     SENSATION: WFL  EDEMA:  None palpated or observed  MUSCLE LENGTH: Hamstrings: Right NT deg; Left NT deg Marcello Moores test: Right NT deg; Left NT deg  POSTURE: rounded shoulders, forward head, decreased lumbar lordosis, and increased thoracic kyphosis  PALPATION: TTP along the peroneal muscles and tendons  LOWER EXTREMITY ROM: Grossly WNLs, assess further as needed Active ROM Right eval Left eval  Hip flexion    Hip extension    Hip abduction    Hip adduction    Hip internal rotation    Hip external rotation    Knee flexion    Knee extension    Ankle dorsiflexion    Ankle plantarflexion    Ankle inversion    Ankle eversion     (Blank rows = not tested)  LOWER EXTREMITY MMT:  MMT Right eval Left eval  Hip flexion 3 2+  Hip extension 3 2+  Hip abduction 3+ 2  Hip adduction    Hip internal rotation    Hip external rotation 4 3  Knee flexion 4+ 4+  Knee extension 4+ 4+  Ankle dorsiflexion  3 p  Ankle plantarflexion  2 p  Ankle inversion  2 p  Ankle eversion  2 p   (Blank rows = not tested)  LOWER EXTREMITY SPECIAL TESTS:  Ankle special tests: NT  FUNCTIONAL TESTS:  5 times sit to stand: TBA 2 minute walk test: TBA  GAIT: Distance walked: 200' Assistive device utilized: None Level of assistance: Complete Independence Comments: Antalgic and Trendelenberg gait, bilaterally L>R   TODAY'S TREATMENT:                                                                                                                               [facility] Adult PT Treatment:  DATE: 04/24/22 Therapeutic Exercise: Developed, instructed in, and pt completed therex as noted in HEP Self Care: Cold pack for pain management, use of ASO ankle brace to minimize lateral ankle/peroneal strain     PATIENT EDUCATION:  Education details: Eval findings, POC, HEP, self care  Person educated: Patient Education method: Explanation,  Demonstration, Tactile cues, Verbal cues, and Handouts Education comprehension: verbalized understanding, returned demonstration, verbal cues required, and tactile cues required  HOME EXERCISE PROGRAM: Access Code: [email or web address] URL: [email or web address] Date: 04/24/2022 Prepared by: Gar Ponto  Exercises - Sidelying Hip Abduction  - 1 x daily - 7 x weekly - 3 sets - 10 reps - 3 hold - Hooklying Clamshell with Resistance  - 1 x daily - 7 x weekly - 3 sets - 10 reps - 3 hold - Active Straight Leg Raise with Quad Set  - 1 x daily - 7 x weekly - 3 sets - 10 reps - 3 hold  ASSESSMENT:  CLINICAL IMPRESSION: Patient is a 67 y.o. male who was seen today for physical therapy evaluation and treatment for M25.572,G89.29 (ICD-10-CM) - Chronic pain of left ankle. Pt presents with significantly decreased L hip and ankle strength resulting and antalgic/Trendelenberg gait producing strain of the peroneal muscles and causing pain. Pt will benefit from skilled PT to address impairments for improved function of the L LE with less pain   OBJECTIVE IMPAIRMENTS: Abnormal gait, decreased activity tolerance, difficulty walking, decreased strength, obesity, and pain.   ACTIVITY LIMITATIONS: carrying, lifting, bending, sitting, standing, squatting, sleeping, stairs, and locomotion level  PARTICIPATION LIMITATIONS: meal prep, cleaning, and laundry  PERSONAL FACTORS: High BMI, Hx of R TKA are also affecting patient's functional outcome.   REHAB POTENTIAL: Good  CLINICAL DECISION MAKING: Stable/uncomplicated  EVALUATION COMPLEXITY: Low   GOALS:  SHORT TERM GOALS: Target date: 05/16/22 Pt will be Ind in an initial HEP  Baseline: Initiated Goal status: INITIAL  2.  Pt will voice understanding of measures to assist in pain reduction  Baseline: initaited Goal status: INITIAL  LONG TERM GOALS: Target date: 06/13/22  Pt will be Ind in a final HEP to maintain achieved LOF  Baseline:  initiated Goal status: INITIAL  2.  Increase L hip and ankle strength to 4/5 to improve gait and decrease L ankle pain Baseline: see flow sheets Goal status: INITIAL  3.  Pt will report a decrease in L ankle pain to 0-4/10 for improved function and QOL Baseline: 3-10/10 Goal status: INITIAL  4.  Improve 5xSTS by MCID of 5" and 2MWT by MCID of 6f as indication of improved functional mobility and quality of gait with reduce antalgic/trendelenberg gait Baseline: TBA Goal status: INITIAL  5.  Pt's FOTO score will improved to the predicted value of 70% as indication of improved function  Baseline: 54% Goal status: INITIAL    PLAN:  PT FREQUENCY: 2x/week  PT DURATION: 6 weeks  PLANNED INTERVENTIONS: Therapeutic exercises, Therapeutic activity, Neuromuscular re-education, Balance training, Gait training, Patient/Family education, Self Care, Joint mobilization, Stair training, Aquatic Therapy, Dry Needling, Electrical stimulation, Cryotherapy, Moist heat, Taping, Vasopneumatic device, Ultrasound, Ionotophoresis '4mg'$ /ml Dexamethasone, Manual therapy, and Re-evaluation  PLAN FOR NEXT SESSION: Review FOTO; assess response to HEP; progress therex as indicated; use of modalities, manual therapy; and TPDN as indicated.    Kory Rains MS, PT 04/25/22 5:37 PM

## 2022-04-24 ENCOUNTER — Other Ambulatory Visit: Payer: Self-pay

## 2022-04-24 ENCOUNTER — Ambulatory Visit: Payer: PPO | Attending: Family Medicine

## 2022-04-24 DIAGNOSIS — G8929 Other chronic pain: Secondary | ICD-10-CM | POA: Insufficient documentation

## 2022-04-24 DIAGNOSIS — M25572 Pain in left ankle and joints of left foot: Secondary | ICD-10-CM

## 2022-04-24 DIAGNOSIS — R262 Difficulty in walking, not elsewhere classified: Secondary | ICD-10-CM | POA: Diagnosis present

## 2022-04-24 DIAGNOSIS — M6281 Muscle weakness (generalized): Secondary | ICD-10-CM

## 2022-04-30 NOTE — Therapy (Signed)
OUTPATIENT PHYSICAL THERAPY TREATMENT NOTE   Patient Name: Scott [name] MRN: [id number] DOB:1955-12-26, 67 y.o., male Today's Date: 05/01/2022  PCP: [name], MD  REFERRING PROVIDER: Gregor Hams, MD   END OF SESSION:   PT End of Session - 05/01/22 0715     Visit Number 2    Number of Visits 13    Date for PT Re-Evaluation 06/13/22    Authorization Type [employer]    Progress Note Due on Visit 10    PT Start Time 0715    PT Stop Time [id number]    PT Time Calculation (min) 43 min    Activity Tolerance Patient tolerated treatment well    Behavior During Therapy [facility] for tasks assessed/performed             Past Medical History:  Diagnosis Date   ALLERGIC RHINITIS 01/14/2007   Allergy    ANXIETY 09/28/2006   Asbestos exposure    Atrial fibrillation ([place]) 09/28/2006   s/p afib ablation x 2   CHF (congestive heart failure) ([place])    CKD (chronic kidney disease), stage III ([place])    patient denies   COPD (chronic obstructive pulmonary disease) ([place])    Coronary artery disease [id number]   Diastolic dysfunction [id number]   Dyspnea    W/ EXERTION    Dysrhythmia    AFIB      First degree AV block 10/12/2018   Noted on EKG   Gallstones    Hyperglycemia    HYPERLIPIDEMIA 09/28/2006   HYPERTENSION 09/28/2006   HYPOTHYROIDISM, ACQUIRED NEC 09/28/2006   pt denies and doesn't know   Long term current use of anticoagulant 04/08/2010   Obese    OBSTRUCTIVE SLEEP APNEA 01/06/2008   NOT USING     Right knee DJD 07/09/2010   S/P CABG x 1 11/26/2011   Right internal mammary artery to right coronary artery   S/P Maze operation for atrial fibrillation 11/26/2011   complete biatrial lesion set with clipping of LA appendage   Sleep apnea    Past Surgical History:  Procedure Laterality Date   ANTERIOR CERVICAL DECOMP/DISCECTOMY FUSION N/A 06/20/2016   Procedure: Anterior Cervical Discectomy Fusion - Cervical five-Cervical six - Cervical six-Cervical seven - Cervicla  seven- Thoracic one;  Surgeon: Earnie Larsson, MD;  Location: [place];  Service: Neurosurgery;  Laterality: N/A;   CARDIOVERSION     X4    CORONARY ARTERY BYPASS GRAFT  11/26/2011   Procedure: CORONARY ARTERY BYPASS GRAFTING (CABG);  Surgeon: Rexene Alberts, MD;  Location: [place];  Service: Open Heart Surgery;  Laterality: N/A;  coronary artery bypass on pump times one utilizing the right internal mammary artery, transesophageal echocardiogram    HAND SURGERY Left    KNEE ARTHROSCOPY Right 12/10/2021   Procedure: RIGH TKNEE ARTHROSCOPY WITH SCAR BAND EXCISION;  Surgeon: Melrose Nakayama, MD;  Location: [facility];  Service: Orthopedics;  Laterality: Right;   LEFT HEART CATHETERIZATION WITH CORONARY ANGIOGRAM N/A 11/24/2011   Procedure: LEFT HEART CATHETERIZATION WITH CORONARY ANGIOGRAM;  Surgeon: Peter M Martinique, MD;  Location: [facility] CATH LAB;  Service: Cardiovascular;  Laterality: N/A;   LOOP RECORDER IMPLANT N/A 07/06/2012   Procedure: LOOP RECORDER IMPLANT;  Surgeon: Thompson Grayer, MD;  Location: [facility] CATH LAB;  Service: Cardiovascular;  Laterality: N/A;   MAZE  11/26/2011   Procedure: MAZE;  Surgeon: Rexene Alberts, MD;  Location: [place];  Service: Open Heart Surgery;  Laterality: N/A;  Cryomaze  Radiofrequency Ablation for atrial fibrillation  12/30/07 and 06/05/09   afib ablation x 2 by JA   Radiofrequency ablation for atrial flutter  2005   CTI   TOTAL KNEE ARTHROPLASTY Right 04/05/2019   Procedure: RIGHT TOTAL KNEE ARTHROPLASTY;  Surgeon: Melrose Nakayama, MD;  Location: [facility];  Service: Orthopedics;  Laterality: Right;   Patient Active Problem List   Diagnosis Date Noted   Left ankle pain 04/07/2022   Left cataract 12/30/2021   Rash 11/14/2021   Axillary lymphadenopathy 11/14/2021   Low back pain 01/01/2021   Lower GI bleeding 01/01/2021   Pain due to total right knee replacement ([place]) 01/09/2020   Primary osteoarthritis of right knee 04/05/2019   CKD (chronic kidney disease) stage 3, GFR 30-59  ml/min (HCC) 03/14/2019   Degenerative joint disease of knee, right 09/27/2018   Dyspnea 09/06/2018   Right knee pain 09/06/2018   Radiculitis of left cervical region 03/05/2018   Plantar fasciitis, left 08/19/2017   Chest pain 02/27/2017   Right ear pain 02/27/2017   Left groin pain 09/13/2016   Rib pain on left side 09/13/2016   Left leg pain 09/13/2016   Stenosis of cervical spine with myelopathy ([place]) 06/20/2016   Special screening for malignant neoplasms, colon 04/04/2016   Cough 01/11/2016   COPD exacerbation ([place]) 12/22/2015   Rectal bleeding 08/14/2014   Intercostal muscle strain 08/07/2014   Chronic meniscal tear of knee 04/06/2014   Heel bone fracture 04/06/2014   Hyperglycemia 04/04/2014   Recurrent falls 04/04/2014   Bilateral knee pain 04/04/2014   Intractable left heel pain 04/04/2014   Insomnia 04/04/2014   Hematochezia 04/04/2014   Bladder neck obstruction 12/19/2013   Encounter for therapeutic drug monitoring 03/30/2013   Palpitations 07/06/2012   Shortness of breath 01/19/2012   Sick sinus syndrome ([place]) 12/01/2011   S/P CABG x 1 11/26/2011   S/P Maze operation for atrial fibrillation 11/26/2011   CAD (coronary artery disease) 11/25/2011   Paroxysmal atrial fibrillation ([place]) 11/24/2011   Chronic combined systolic (congestive) and diastolic (congestive) heart failure ([place]) 11/24/2011   Gross hematuria 12/12/2010   Left knee pain 12/12/2010   Left knee DJD 07/09/2010   Encounter for well adult exam with abnormal findings 07/07/2010   Chronic anticoagulation 04/08/2010   COPD (chronic obstructive pulmonary disease) ([place]) 10/04/2008   OBSTRUCTIVE SLEEP APNEA 01/06/2008   ALLERGIC RHINITIS 01/14/2007   Hypothyroidism 09/28/2006   Hyperlipidemia 09/28/2006   Anxiety state 09/28/2006   Essential hypertension 09/28/2006    REFERRING DIAG: M25.572,G89.29 (ICD-10-CM) - Chronic pain of left ankle    THERAPY DIAG:  Pain in left ankle and joints of left  foot  Difficulty in walking, not elsewhere classified  Muscle weakness (generalized)  Rationale for Evaluation and Treatment Rehabilitation  ONSET DATE: Approx 8 months   SUBJECTIVE:    SUBJECTIVE STATEMENT: Pt reports his L ankle is the same. He tried a type of ankle brace, but it fell apart. Pt reports limited completion of his HEP. Stood all day yesterday so his ankle is hurting more today.   PAIN:  Are you having pain? Yes: NPRS scale: 9/10 Pain location: L lateral ankle Pain description: Sharp Aggravating factors: Walking, certain positions Relieving factors: Tylenol, tramadol, rest, cold pack Pain range: 3-10/10  PERTINENT HISTORY: High BMI, Hx of R TKA   PRECAUTIONS: None   WEIGHT BEARING RESTRICTIONS: No   FALLS:  Has patient fallen in last 6 months? Yes. Number of falls 2 L Ankle giving way, or  dogs pulling on him   LIVING ENVIRONMENT: Lives with: lives alone Lives in: House/apartment Able to access and be mobile in home   OCCUPATION: 1 day a week, stands all day   PLOF: Independent   PATIENT GOALS: Less pain and to walk better   NEXT MD VISIT: Not currently scheduled   OBJECTIVE: (objective measures completed at initial evaluation unless otherwise dated)   DIAGNOSTIC FINDINGS: 04/08/22 DG ankle FINDINGS: There is no evidence of fracture, dislocation, or joint effusion. There is no evidence of arthropathy or other focal bone abnormality. Soft tissues are unremarkable. There are posterior and plantar calcaneal spurs.   IMPRESSION: Calcaneal spurs.  No acute osseous abnormalities.   04/08/22/ [facility] Ultrasound of: Left ankle Mild joint effusion present at lateral ankle. Posterior tibialis tendons and peroneal tendons are normal-appearing without tear or severe tenosynovitis. Impression: Mild joint effusion   PATIENT SURVEYS:  FOTO: Perceived function   54%, predicted   70%    COGNITION: Overall cognitive status: Within  functional limits for tasks assessed                         SENSATION: WFL   EDEMA:  None palpated or observed   MUSCLE LENGTH: Hamstrings: Right NT deg; Left NT deg Marcello Moores test: Right NT deg; Left NT deg   POSTURE: rounded shoulders, forward head, decreased lumbar lordosis, and increased thoracic kyphosis   PALPATION: TTP along the peroneal muscles and tendons   LOWER EXTREMITY ROM: Grossly WNLs, assess further as needed Active ROM Right eval Left eval  Hip flexion      Hip extension      Hip abduction      Hip adduction      Hip internal rotation      Hip external rotation      Knee flexion      Knee extension      Ankle dorsiflexion      Ankle plantarflexion      Ankle inversion      Ankle eversion       (Blank rows = not tested)   LOWER EXTREMITY MMT:   MMT Right eval Left eval  Hip flexion 3 2+  Hip extension 3 2+  Hip abduction 3+ 2  Hip adduction      Hip internal rotation      Hip external rotation 4 3  Knee flexion 4+ 4+  Knee extension 4+ 4+  Ankle dorsiflexion   3 p  Ankle plantarflexion   2 p  Ankle inversion   2 p  Ankle eversion   2 p   (Blank rows = not tested)   LOWER EXTREMITY SPECIAL TESTS:  Ankle special tests: NT   FUNCTIONAL TESTS:  5 times sit to stand: TBA 2 minute walk test: TBA   GAIT: Distance walked: 200' Assistive device utilized: None Level of assistance: Complete Independence Comments: Antalgic and Trendelenberg gait, bilaterally L>R     TODAY'S TREATMENT:   [facility] Adult PT Treatment:                                                DATE: 05/01/22 Therapeutic Exercise: S/L hip abd x10, limited arnge, weakness Dced S/L clam GTB 2x10 Ankle DF/PF standing 2x10 Standing hip abd 2x10 L SL balance c hand A as needed,  difficult Dced Tandem standing each L ankle eversion RTB 3x10 Updated HEP Self Care: Info for a ASO ankle bracefo support with walking                                                                                                                            [facility] Adult PT Treatment:                                                DATE: 04/24/22 Therapeutic Exercise: Developed, instructed in, and pt completed therex as noted in HEP Self Care: Cold pack for pain management, use of ASO ankle brace to minimize lateral ankle/peroneal strain       PATIENT EDUCATION:  Education details: Eval findings, POC, HEP, self care  Person educated: Patient Education method: Explanation, Demonstration, Tactile cues, Verbal cues, and Handouts Education comprehension: verbalized understanding, returned demonstration, verbal cues required, and tactile cues required   HOME EXERCISE PROGRAM: Access Code: [email or web address] URL: [email or web address] Date: 05/01/2022 Prepared by: Gar Ponto  Exercises - Active Straight Leg Raise with Quad Set  - 1 x daily - 7 x weekly - 3 sets - 10 reps - 3 hold - Standing Hip Abduction with Counter Support  - 1 x daily - 7 x weekly - 3 sets - 10 reps - 3 hold - Standing Tandem Balance with Counter Support  - 1 x daily - 7 x weekly - 1 sets - 10 reps - 30-60 hold - Clamshell with Resistance  - 1 x daily - 7 x weekly - 3 sets - 10 reps - 3 hold - Seated Ankle Eversion with Resistance  - 1 x daily - 7 x weekly - 3 sets - 10 reps - 3 hold   ASSESSMENT:   CLINICAL IMPRESSION: PT was completed for L hip and ankle strengthening. L hip abd is marked weak and pt walks with a Tredelenberg gait pattern. Pt tried using an ankle brace of poor quality which fell a part on his first walk. Pt is going to try and obtain an ASO brace. Encouraged pt to complete his HEP consistently to improve his strength which is his primary deficit along with pain. Pt tolerated PT today without adverse effects. Pt will continue to benefit from skilled PT to address impairments for improved function with less pain.      OBJECTIVE IMPAIRMENTS: Abnormal gait, decreased activity tolerance, difficulty  walking, decreased strength, obesity, and pain.    ACTIVITY LIMITATIONS: carrying, lifting, bending, sitting, standing, squatting, sleeping, stairs, and locomotion level   PARTICIPATION LIMITATIONS: meal prep, cleaning, and laundry   PERSONAL FACTORS: High BMI, Hx of R TKA are also affecting patient's functional outcome.    REHAB POTENTIAL: Good   CLINICAL DECISION MAKING: Stable/uncomplicated   EVALUATION COMPLEXITY: Low     GOALS:   SHORT TERM  GOALS: Target date: 05/16/22 Pt will be Ind in an initial HEP  Baseline: Initiated Goal status: INITIAL   2.  Pt will voice understanding of measures to assist in pain reduction  Baseline: initaited Goal status: INITIAL   LONG TERM GOALS: Target date: 06/13/22   Pt will be Ind in a final HEP to maintain achieved LOF  Baseline: initiated Goal status: INITIAL   2.  Increase L hip and ankle strength to 4/5 to improve gait and decrease L ankle pain Baseline: see flow sheets Goal status: INITIAL   3.  Pt will report a decrease in L ankle pain to 0-4/10 for improved function and QOL Baseline: 3-10/10 Goal status: INITIAL   4.  Improve 5xSTS by MCID of 5" and 2MWT by MCID of 21f as indication of improved functional mobility and quality of gait with reduce antalgic/trendelenberg gait Baseline: TBA Goal status: INITIAL   5.  Pt's FOTO score will improved to the predicted value of 70% as indication of improved function  Baseline: 54% Goal status: INITIAL       PLAN:   PT FREQUENCY: 2x/week   PT DURATION: 6 weeks   PLANNED INTERVENTIONS: Therapeutic exercises, Therapeutic activity, Neuromuscular re-education, Balance training, Gait training, Patient/Family education, Self Care, Joint mobilization, Stair training, Aquatic Therapy, Dry Needling, Electrical stimulation, Cryotherapy, Moist heat, Taping, Vasopneumatic device, Ultrasound, Ionotophoresis '4mg'$ /ml Dexamethasone, Manual therapy, and Re-evaluation   PLAN FOR NEXT SESSION:  Review FOTO; assess response to HEP; progress therex as indicated; use of modalities, manual therapy; and TPDN as indicated.    Maylee Bare MS, PT 05/01/22 11:38 AM

## 2022-05-01 ENCOUNTER — Ambulatory Visit: Payer: PPO | Attending: Family Medicine

## 2022-05-01 DIAGNOSIS — M6281 Muscle weakness (generalized): Secondary | ICD-10-CM | POA: Insufficient documentation

## 2022-05-01 DIAGNOSIS — M25572 Pain in left ankle and joints of left foot: Secondary | ICD-10-CM | POA: Diagnosis present

## 2022-05-01 DIAGNOSIS — R262 Difficulty in walking, not elsewhere classified: Secondary | ICD-10-CM | POA: Insufficient documentation

## 2022-05-09 ENCOUNTER — Ambulatory Visit: Payer: PPO | Admitting: Physical Therapy

## 2022-05-09 ENCOUNTER — Encounter: Payer: Self-pay | Admitting: Physical Therapy

## 2022-05-09 DIAGNOSIS — M25572 Pain in left ankle and joints of left foot: Secondary | ICD-10-CM

## 2022-05-09 DIAGNOSIS — R262 Difficulty in walking, not elsewhere classified: Secondary | ICD-10-CM

## 2022-05-09 DIAGNOSIS — M6281 Muscle weakness (generalized): Secondary | ICD-10-CM

## 2022-05-09 NOTE — Therapy (Signed)
OUTPATIENT PHYSICAL THERAPY TREATMENT NOTE   Patient Name: Scott [name] MRN: [id number] DOB:[birth year], 67 y.o., male Today's Date: 05/09/2022  PCP: [name], MD  REFERRING PROVIDER: Gregor Hams, MD   END OF SESSION:   PT End of Session - 05/09/22 0725     Visit Number 3    Number of Visits 13    Date for PT Re-Evaluation 06/13/22    Authorization Type [employer]    PT Start Time [id number]    PT Stop Time 0811    PT Time Calculation (min) 48 min             Past Medical History:  Diagnosis Date   ALLERGIC RHINITIS 01/14/2007   Allergy    ANXIETY 09/28/2006   Asbestos exposure    Atrial fibrillation ([place]) 09/28/2006   s/p afib ablation x 2   CHF (congestive heart failure) ([place])    CKD (chronic kidney disease), stage III ([place])    patient denies   COPD (chronic obstructive pulmonary disease) ([place])    Coronary artery disease [id number]   Diastolic dysfunction [id number]   Dyspnea    W/ EXERTION    Dysrhythmia    AFIB      First degree AV block 10/12/2018   Noted on EKG   Gallstones    Hyperglycemia    HYPERLIPIDEMIA 09/28/2006   HYPERTENSION 09/28/2006   HYPOTHYROIDISM, ACQUIRED NEC 09/28/2006   pt denies and doesn't know   Long term current use of anticoagulant 04/08/2010   Obese    OBSTRUCTIVE SLEEP APNEA 01/06/2008   NOT USING     Right knee DJD 07/09/2010   S/P CABG x 1 11/26/2011   Right internal mammary artery to right coronary artery   S/P Maze operation for atrial fibrillation 11/26/2011   complete biatrial lesion set with clipping of LA appendage   Sleep apnea    Past Surgical History:  Procedure Laterality Date   ANTERIOR CERVICAL DECOMP/DISCECTOMY FUSION N/A 06/20/2016   Procedure: Anterior Cervical Discectomy Fusion - Cervical five-Cervical six - Cervical six-Cervical seven - Cervicla seven- Thoracic one;  Surgeon: Earnie Larsson, MD;  Location: [place];  Service: Neurosurgery;  Laterality: N/A;   CARDIOVERSION     X4    CORONARY ARTERY  BYPASS GRAFT  11/26/2011   Procedure: CORONARY ARTERY BYPASS GRAFTING (CABG);  Surgeon: Rexene Alberts, MD;  Location: [place];  Service: Open Heart Surgery;  Laterality: N/A;  coronary artery bypass on pump times one utilizing the right internal mammary artery, transesophageal echocardiogram    HAND SURGERY Left    KNEE ARTHROSCOPY Right 12/10/2021   Procedure: RIGH TKNEE ARTHROSCOPY WITH SCAR BAND EXCISION;  Surgeon: Melrose Nakayama, MD;  Location: [facility];  Service: Orthopedics;  Laterality: Right;   LEFT HEART CATHETERIZATION WITH CORONARY ANGIOGRAM N/A 11/24/2011   Procedure: LEFT HEART CATHETERIZATION WITH CORONARY ANGIOGRAM;  Surgeon: Peter M Martinique, MD;  Location[facility] C[facility] LAB;  Service: Cardiovascular;  Laterality: N/A;   LOOP RECORDER IMPLANT N/A 07/06/2012   Procedure: LOOP RECORDER IMPLANT;  Surgeon: Thompson Grayer, MD;  Location: [facility] CATH LAB;  Service: Cardiovascular;  Laterality: N/A;   MAZE  11/26/2011   Procedure: MAZE;  Surgeon: Rexene Alberts, MD;  Location: [place];  Service: Open Heart Surgery;  Laterality: N/A;  Cryomaze    Radiofrequency Ablation for atrial fibrillation  12/30/07 and 06/05/09   afib ablation x 2 by [facility]   Radiofrequency ablation for atrial flutter  2005  [facility]   TOTAL KNEE ARTHROPLASTY Right 04/05/2019   Procedure: RIGHT TOTAL KNEE ARTHROPLASTY;  Surgeon: Melrose Nakayama, MD;  Location: [facility];  Service: Orthopedics;  Laterality: Right;   Patient Active Problem List   Diagnosis Date Noted   Left ankle pain 04/07/2022   Left cataract 12/30/2021   Rash 11/14/2021   Axillary lymphadenopathy 11/14/2021   Low back pain 01/01/2021   Lower GI bleeding 01/01/2021   Pain due to total right knee replacement ([place]) 01/09/2020   Primary osteoarthritis of right knee 04/05/2019   CKD (chronic kidney disease) stage 3, GFR 30-59 ml/min ([facility]) 03/14/2019   Degenerative joint disease of knee, right 09/27/2018   Dyspnea 09/06/2018   Right knee pain 09/06/2018   Radiculitis of left  cervical region 03/05/2018   Plantar fasciitis, left 08/19/2017   Chest pain 02/27/2017   Right ear pain 02/27/2017   Left groin pain 09/13/2016   Rib pain on left side 09/13/2016   Left leg pain 09/13/2016   Stenosis of cervical spine with myelopathy ([place]) 06/20/2016   Special screening for malignant neoplasms, colon 04/04/2016   Cough 01/11/2016   COPD exacerbation ([place]) 12/22/2015   Rectal bleeding 08/14/2014   Intercostal muscle strain 08/07/2014   Chronic meniscal tear of knee 04/06/2014   Heel bone fracture 04/06/2014   Hyperglycemia 04/04/2014   Recurrent falls 04/04/2014   Bilateral knee pain 04/04/2014   Intractable left heel pain 04/04/2014   Insomnia 04/04/2014   Hematochezia 04/04/2014   Bladder neck obstruction 12/19/2013   Encounter for therapeutic drug monitoring 03/30/2013   Palpitations 07/06/2012   Shortness of breath 01/19/2012   Sick sinus syndrome ([place]) 12/01/2011   S/P CABG x 1 11/26/2011   S/P Maze operation for atrial fibrillation 11/26/2011   CAD (coronary artery disease) 11/25/2011   Paroxysmal atrial fibrillation ([place]) 11/24/2011   Chronic combined systolic (congestive) and diastolic (congestive) heart failure ([place]) 11/24/2011   Gross hematuria 12/12/2010   Left knee pain 12/12/2010   Left knee DJD 07/09/2010   Encounter for well adult exam with abnormal findings 07/07/2010   Chronic anticoagulation 04/08/2010   COPD (chronic obstructive pulmonary disease) ([place]) 10/04/2008   OBSTRUCTIVE SLEEP APNEA 01/06/2008   ALLERGIC RHINITIS 01/14/2007   Hypothyroidism 09/28/2006   Hyperlipidemia 09/28/2006   Anxiety state 09/28/2006   Essential hypertension 09/28/2006    REFERRING DIAG: M25.572,G89.29 (ICD-10-CM) - Chronic pain of left ankle    THERAPY DIAG:  Pain in left ankle and joints of left foot  Difficulty in walking, not elsewhere classified  Muscle weakness (generalized)  Rationale for Evaluation and Treatment Rehabilitation  ONSET DATE:  Approx 8 months   SUBJECTIVE:    SUBJECTIVE STATEMENT: Pt reports his L ankle is the same, 10/10 this morning. He has new ankle brace that does not provide much pain relief. He reports compliance with HEP.     PAIN:  Are you having pain? Yes: NPRS scale: 10/10 Pain location: L lateral ankle Pain description: Sharp Aggravating factors: Walking, certain positions Relieving factors: Tylenol, tramadol, rest, cold pack Pain range: 3-10/10  PERTINENT HISTORY: High BMI, Hx of R TKA   PRECAUTIONS: None   WEIGHT BEARING RESTRICTIONS: No   FALLS:  Has patient fallen in last 6 months? Yes. Number of falls 2 L Ankle giving way, or dogs pulling on him   LIVING ENVIRONMENT: Lives with: lives alone Lives in: House/apartment Able to access and be mobile in home   OCCUPATION: 1 day a week, stands all day  PLOF: Independent   PATIENT GOALS: Less pain and to walk better   NEXT MD VISIT: Not currently scheduled   OBJECTIVE: (objective measures completed at initial evaluation unless otherwise dated)   DIAGNOSTIC FINDINGS: 04/08/22 DG ankle FINDINGS: There is no evidence of fracture, dislocation, or joint effusion. There is no evidence of arthropathy or other focal bone abnormality. Soft tissues are unremarkable. There are posterior and plantar calcaneal spurs.   IMPRESSION: Calcaneal spurs.  No acute osseous abnormalities.   04/08/22/ Diagnostic Limited [facility] Ultrasound of: Left ankle Mild joint effusion present at lateral ankle. Posterior tibialis tendons and peroneal tendons are normal-appearing without tear or severe tenosynovitis. Impression: Mild joint effusion   PATIENT SURVEYS:  FOTO: Perceived function   54%, predicted   70%    COGNITION: Overall cognitive status: Within functional limits for tasks assessed                         SENSATION: WFL   EDEMA:  None palpated or observed   MUSCLE LENGTH: Hamstrings: Right NT deg; Left NT deg Marcello Moores test: Right NT  deg; Left NT deg   POSTURE: rounded shoulders, forward head, decreased lumbar lordosis, and increased thoracic kyphosis   PALPATION: TTP along the peroneal muscles and tendons   LOWER EXTREMITY ROM: Grossly WNLs, assess further as needed Active ROM Right eval Left eval  Hip flexion      Hip extension      Hip abduction      Hip adduction      Hip internal rotation      Hip external rotation      Knee flexion      Knee extension      Ankle dorsiflexion      Ankle plantarflexion      Ankle inversion      Ankle eversion       (Blank rows = not tested)   LOWER EXTREMITY MMT:   MMT Right eval Left eval  Hip flexion 3 2+  Hip extension 3 2+  Hip abduction 3+ 2  Hip adduction      Hip internal rotation      Hip external rotation 4 3  Knee flexion 4+ 4+  Knee extension 4+ 4+  Ankle dorsiflexion   3 p  Ankle plantarflexion   2 p  Ankle inversion   2 p  Ankle eversion   2 p   (Blank rows = not tested)   LOWER EXTREMITY SPECIAL TESTS:  Ankle special tests: NT   FUNCTIONAL TESTS:  5 times sit to stand: TBA 2 minute walk test: TBA   GAIT: Distance walked: 200' Assistive device utilized: None Level of assistance: Complete Independence Comments: Antalgic and Trendelenberg gait, bilaterally L>R     TODAY'S TREATMENT:   [facility] Adult PT Treatment:                                                DATE: 05/09/22 Therapeutic Exercise: Seated hip flexion, alt x 10  Seated clam green band  Seated gastroc stretch with strap x 3 Seated soleus stretch with strap x 3  Supine hamstring stretch Passive ankle DF, INV, EV Supine ankle EV isometric Supine ankle circles Updated HEP   Modalities: Ice pack to left ankle and left knee Self Care: Benefits of RICE to calm down  high level of pain   [facility] Adult PT Treatment:                                                DATE: 05/01/22 Therapeutic Exercise: S/L hip abd x10, limited arnge, weakness Dced S/L clam GTB 2x10 Ankle DF/PF  standing 2x10 Standing hip abd 2x10 L SL balance c hand A as needed, difficult Dced Tandem standing each L ankle eversion RTB 3x10 Updated HEP Self Care: Info for a ASO ankle bracefo support with walking                                                                                                                           [facility] Adult PT Treatment:                                                DATE: 04/24/22 Therapeutic Exercise: Developed, instructed in, and pt completed therex as noted in HEP Self Care: Cold pack for pain management, use of ASO ankle brace to minimize lateral ankle/peroneal strain       PATIENT EDUCATION:  Education details: Eval findings, POC, HEP, self care  Person educated: Patient Education method: Explanation, Demonstration, Tactile cues, Verbal cues, and Handouts Education comprehension: verbalized understanding, returned demonstration, verbal cues required, and tactile cues required   HOME EXERCISE PROGRAM: Access Code: [email or web address] URL: [email or web address] Date: 05/01/2022 Prepared by: Gar Ponto  Exercises - Active Straight Leg Raise with Quad Set  - 1 x daily - 7 x weekly - 3 sets - 10 reps - 3 hold - Standing Hip Abduction with Counter Support  - 1 x daily - 7 x weekly - 3 sets - 10 reps - 3 hold - Standing Tandem Balance with Counter Support  - 1 x daily - 7 x weekly - 1 sets - 10 reps - 30-60 hold - Clamshell with Resistance  - 1 x daily - 7 x weekly - 3 sets - 10 reps - 3 hold - Seated Ankle Eversion with Resistance  - 1 x daily - 7 x weekly - 3 sets - 10 reps - 3 hold Added 05/09/22 - Seated Gastroc Stretch with Strap  - 1 x daily - 7 x weekly - 1 sets - 3 reps - 30 hold - Seated Soleus Stretch with Strap  - 1 x daily - 7 x weekly - 1 sets - 3 reps - 30 hold - Supine Ankle Circles  - 1 x daily - 7 x weekly - 1 sets - 3 reps - 30 hold   ASSESSMENT:   CLINICAL IMPRESSION: PT was completed for L hip and ankle strengthening. Pt  arrives with new ASO with limited benefit. Pain  10/10 this morning. Discussed more frequent ice application and added seated calf stretches. Increased pain with all open chain ankle motion. Updated HEP. Pt tolerated PT today without adverse effects. Pt will continue to benefit from skilled PT to address impairments for improved function with less pain.      OBJECTIVE IMPAIRMENTS: Abnormal gait, decreased activity tolerance, difficulty walking, decreased strength, obesity, and pain.    ACTIVITY LIMITATIONS: carrying, lifting, bending, sitting, standing, squatting, sleeping, stairs, and locomotion level   PARTICIPATION LIMITATIONS: meal prep, cleaning, and laundry   PERSONAL FACTORS: High BMI, Hx of R TKA are also affecting patient's functional outcome.    REHAB POTENTIAL: Good   CLINICAL DECISION MAKING: Stable/uncomplicated   EVALUATION COMPLEXITY: Low     GOALS:   SHORT TERM GOALS: Target date: 05/16/22 Pt will be Ind in an initial HEP  Baseline: Initiated Goal status: INITIAL   2.  Pt will voice understanding of measures to assist in pain reduction  Baseline: initaited Goal status: INITIAL   LONG TERM GOALS: Target date: 06/13/22   Pt will be Ind in a final HEP to maintain achieved LOF  Baseline: initiated Goal status: INITIAL   2.  Increase L hip and ankle strength to 4/5 to improve gait and decrease L ankle pain Baseline: see flow sheets Goal status: INITIAL   3.  Pt will report a decrease in L ankle pain to 0-4/10 for improved function and QOL Baseline: 3-10/10 Goal status: INITIAL   4.  Improve 5xSTS by MCID of 5" and 2MWT by MCID of 24ft as indication of improved functional mobility and quality of gait with reduce antalgic/trendelenberg gait Baseline: TBA Goal status: INITIAL   5.  Pt's FOTO score will improved to the predicted value of 70% as indication of improved function  Baseline: 54% Goal status: INITIAL       PLAN:   PT FREQUENCY: 2x/week   PT  DURATION: 6 weeks   PLANNED INTERVENTIONS: Therapeutic exercises, Therapeutic activity, Neuromuscular re-education, Balance training, Gait training, Patient/Family education, Self Care, Joint mobilization, Stair training, Aquatic Therapy, Dry Needling, Electrical stimulation, Cryotherapy, Moist heat, Taping, Vasopneumatic device, Ultrasound, Ionotophoresis 4mg /ml Dexamethasone, Manual therapy, and Re-evaluation   PLAN FOR NEXT SESSION: Review FOTO; assess response to HEP; progress therex as indicated; use of modalities, manual therapy; and TPDN as indicated.    Hessie Diener, PTA 05/09/22 8:55 AM Phone: [phone] Fax: [phone]

## 2022-05-13 ENCOUNTER — Ambulatory Visit: Payer: PPO | Admitting: Physical Therapy

## 2022-05-13 ENCOUNTER — Encounter: Payer: Self-pay | Admitting: Physical Therapy

## 2022-05-13 DIAGNOSIS — M25572 Pain in left ankle and joints of left foot: Secondary | ICD-10-CM

## 2022-05-13 DIAGNOSIS — M6281 Muscle weakness (generalized): Secondary | ICD-10-CM

## 2022-05-13 DIAGNOSIS — R262 Difficulty in walking, not elsewhere classified: Secondary | ICD-10-CM

## 2022-05-13 NOTE — Therapy (Signed)
OUTPATIENT PHYSICAL THERAPY TREATMENT NOTE   Patient Name: Scott [name] MRN: [id number] DOB:21-Jan-1956, 67 y.o., male Today's Date: 05/13/2022  PCP: [name], MD  REFERRING PROVIDER: Gregor Hams, MD   END OF SESSION:   PT End of Session - 05/13/22 0721     Visit Number 4    Number of Visits 13    Date for PT Re-Evaluation 06/13/22    Authorization Type [employer]    Progress Note Due on Visit 10    PT Start Time 0719    PT Stop Time 0800    PT Time Calculation (min) 41 min             Past Medical History:  Diagnosis Date   ALLERGIC RHINITIS 01/14/2007   Allergy    ANXIETY 09/28/2006   Asbestos exposure    Atrial fibrillation ([place]) 09/28/2006   s/p afib ablation x 2   CHF (congestive heart failure) ([place])    CKD (chronic kidney disease), stage III ([place])    patient denies   COPD (chronic obstructive pulmonary disease) ([place])    Coronary artery disease [id number]   Diastolic dysfunction [id number]   Dyspnea    W/ EXERTION    Dysrhythmia    AFIB      First degree AV block 10/12/2018   Noted on EKG   Gallstones    Hyperglycemia    HYPERLIPIDEMIA 09/28/2006   HYPERTENSION 09/28/2006   HYPOTHYROIDISM, ACQUIRED NEC 09/28/2006   pt denies and doesn't know   Long term current use of anticoagulant 04/08/2010   Obese    OBSTRUCTIVE SLEEP APNEA 01/06/2008   NOT USING     Right knee DJD 07/09/2010   S/P CABG x 1 11/26/2011   Right internal mammary artery to right coronary artery   S/P Maze operation for atrial fibrillation 11/26/2011   complete biatrial lesion set with clipping of LA appendage   Sleep apnea    Past Surgical History:  Procedure Laterality Date   ANTERIOR CERVICAL DECOMP/DISCECTOMY FUSION N/A 06/20/2016   Procedure: Anterior Cervical Discectomy Fusion - Cervical five-Cervical six - Cervical six-Cervical seven - Cervicla seven- Thoracic one;  Surgeon: Earnie Larsson, MD;  Location: [facility];  Service: Neurosurgery;  Laterality: N/A;    CARDIOVERSION     X4    CORONARY ARTERY BYPASS GRAFT  11/26/2011   Procedure: CORONARY ARTERY BYPASS GRAFTING (CABG);  Surgeon: Rexene Alberts, MD;  Location: [facility];  Service: Open Heart Surgery;  Laterality: N/A;  coronary artery bypass on pump times one utilizing the right internal mammary artery, transesophageal echocardiogram    HAND SURGERY Left    KNEE ARTHROSCOPY Right 12/10/2021   Procedure: RIGH TKNEE ARTHROSCOPY WITH SCAR BAND EXCISION;  Surgeon: Melrose Nakayama, MD;  Location: [facility];  Service: Orthopedics;  Laterality: Right;   LEFT HEART CATHETERIZATION WITH CORONARY ANGIOGRAM N/A 11/24/2011   Procedure: LEFT HEART CATHETERIZATION WITH CORONARY ANGIOGRAM;  Surgeon: Peter M Martinique, MD;  Location: [facility] CATH LAB;  Service: Cardiovascular;  Laterality: N/A;   LOOP RECORDER IMPLANT N/A 07/06/2012   Procedure: LOOP RECORDER IMPLANT;  Surgeon: Thompson Grayer, MD;  Location: [facility] C[facility] LAB;  Service: Cardiovascular;  Laterality: N/A;   MAZE  11/26/2011   Procedure: MAZE;  Surgeon: Rexene Alberts, MD;  Location: [facility];  Service: Open Heart Surgery;  Laterality: N/A;  Cryomaze    Radiofrequency Ablation for atrial fibrillation  12/30/07 and 06/05/09   afib ablation x 2 by Greggory Brandy  Radiofrequency ablation for atrial flutter  2005   CTI   TOTAL KNEE ARTHROPLASTY Right 04/05/2019   Procedure: RIGHT TOTAL KNEE ARTHROPLASTY;  Surgeon: Melrose Nakayama, MD;  Location: [facility];  Service: Orthopedics;  Laterality: Right;   Patient Active Problem List   Diagnosis Date Noted   Left ankle pain 04/07/2022   Left cataract 12/30/2021   Rash 11/14/2021   Axillary lymphadenopathy 11/14/2021   Low back pain 01/01/2021   Lower GI bleeding 01/01/2021   Pain due to total right knee replacement ([place]) 01/09/2020   Primary osteoarthritis of right knee 04/05/2019   CKD (chronic kidney disease) stage 3, GFR 30-59 ml/min (HCC) 03/14/2019   Degenerative joint disease of knee, right 09/27/2018   Dyspnea 09/06/2018   Right knee  pain 09/06/2018   Radiculitis of left cervical region 03/05/2018   Plantar fasciitis, left 08/19/2017   Chest pain 02/27/2017   Right ear pain 02/27/2017   Left groin pain 09/13/2016   Rib pain on left side 09/13/2016   Left leg pain 09/13/2016   Stenosis of cervical spine with myelopathy ([place]) 06/20/2016   Special screening for malignant neoplasms, colon 04/04/2016   Cough 01/11/2016   COPD exacerbation ([place]) 12/22/2015   Rectal bleeding 08/14/2014   Intercostal muscle strain 08/07/2014   Chronic meniscal tear of knee 04/06/2014   Heel bone fracture 04/06/2014   Hyperglycemia 04/04/2014   Recurrent falls 04/04/2014   Bilateral knee pain 04/04/2014   Intractable left heel pain 04/04/2014   Insomnia 04/04/2014   Hematochezia 04/04/2014   Bladder neck obstruction 12/19/2013   Encounter for therapeutic drug monitoring 03/30/2013   Palpitations 07/06/2012   Shortness of breath 01/19/2012   Sick sinus syndrome ([place]) 12/01/2011   S/P CABG x 1 11/26/2011   S/P Maze operation for atrial fibrillation 11/26/2011   CAD (coronary artery disease) 11/25/2011   Paroxysmal atrial fibrillation ([place]) 11/24/2011   Chronic combined systolic (congestive) and diastolic (congestive) heart failure ([place]) 11/24/2011   Gross hematuria 12/12/2010   Left knee pain 12/12/2010   Left knee DJD 07/09/2010   Encounter for well adult exam with abnormal findings 07/07/2010   Chronic anticoagulation 04/08/2010   COPD (chronic obstructive pulmonary disease) ([place]) 10/04/2008   OBSTRUCTIVE SLEEP APNEA 01/06/2008   ALLERGIC RHINITIS 01/14/2007   Hypothyroidism 09/28/2006   Hyperlipidemia 09/28/2006   Anxiety state 09/28/2006   Essential hypertension 09/28/2006    REFERRING DIAG: M25.572,G89.29 (ICD-10-CM) - Chronic pain of left ankle    THERAPY DIAG:  Pain in left ankle and joints of left foot  Difficulty in walking, not elsewhere classified  Muscle weakness (generalized)  Rationale for Evaluation and  Treatment Rehabilitation  ONSET DATE: Approx 8 months   SUBJECTIVE:    SUBJECTIVE STATEMENT: Pt reports his L ankle is not hurting but the side of the lower leg hurts, 5/10.    PAIN:  Are you having pain? Yes: NPRS scale: 5/10 Pain location: L lateral ankle  Pain description: Sharp Aggravating factors: Walking, certain positions Relieving factors: Tylenol, tramadol, rest, cold pack Pain range: 3-10/10  PERTINENT HISTORY: High BMI, Hx of R TKA   PRECAUTIONS: None   WEIGHT BEARING RESTRICTIONS: No   FALLS:  Has patient fallen in last 6 months? Yes. Number of falls 2 L Ankle giving way, or dogs pulling on him   LIVING ENVIRONMENT: Lives with: lives alone Lives in: House/apartment Able to access and be mobile in home   OCCUPATION: 1 day a week, stands all day   PLOF: Independent  PATIENT GOALS: Less pain and to walk better   NEXT MD VISIT: Not currently scheduled   OBJECTIVE: (objective measures completed at initial evaluation unless otherwise dated)   DIAGNOSTIC FINDINGS: 04/08/22 DG ankle FINDINGS: There is no evidence of fracture, dislocation, or joint effusion. There is no evidence of arthropathy or other focal bone abnormality. Soft tissues are unremarkable. There are posterior and plantar calcaneal spurs.   IMPRESSION: Calcaneal spurs.  No acute osseous abnormalities.   04/08/22/ [facility] Ultrasound of: Left ankle Mild joint effusion present at lateral ankle. Posterior tibialis tendons and peroneal tendons are normal-appearing without tear or severe tenosynovitis. Impression: Mild joint effusion   PATIENT SURVEYS:  FOTO: Perceived function   54%, predicted   70%    COGNITION: Overall cognitive status: Within functional limits for tasks assessed                         SENSATION: WFL   EDEMA:  None palpated or observed   MUSCLE LENGTH: Hamstrings: Right NT deg; Left NT deg Marcello Moores test: Right NT deg; Left NT deg   POSTURE:  rounded shoulders, forward head, decreased lumbar lordosis, and increased thoracic kyphosis   PALPATION: TTP along the peroneal muscles and tendons   LOWER EXTREMITY ROM: Grossly WNLs, assess further as needed Active ROM Right eval Left eval  Hip flexion      Hip extension      Hip abduction      Hip adduction      Hip internal rotation      Hip external rotation      Knee flexion      Knee extension      Ankle dorsiflexion      Ankle plantarflexion      Ankle inversion      Ankle eversion       (Blank rows = not tested)   LOWER EXTREMITY MMT:   MMT Right eval Left eval  Hip flexion 3 2+  Hip extension 3 2+  Hip abduction 3+ 2  Hip adduction      Hip internal rotation      Hip external rotation 4 3  Knee flexion 4+ 4+  Knee extension 4+ 4+  Ankle dorsiflexion   3 p  Ankle plantarflexion   2 p  Ankle inversion   2 p  Ankle eversion   2 p   (Blank rows = not tested)   LOWER EXTREMITY SPECIAL TESTS:  Ankle special tests: NT   FUNCTIONAL TESTS:  5 times sit to stand: TBA 2 minute walk test: TBA   GAIT: Distance walked: 200' Assistive device utilized: None Level of assistance: Complete Independence Comments: Antalgic and Trendelenberg gait, bilaterally L>R     TODAY'S TREATMENT:   [facility] Adult PT Treatment:                                                DATE: 05/13/22 Therapeutic Exercise: Seated Baps Board L2 progressed to circles each way Ankle eversion red band 10 x 2  Seated heel raises 10# on knee 10 x 2  Seated Gastroc stretch 30 sec x 3  Seated soleus stretch 30 sec x 3   Manual Therapy: STW to calf in prone Passive calf stretched Prone    [facility] Adult PT Treatment:  DATE: 05/09/22 Therapeutic Exercise: Seated hip flexion, alt x 10  Seated clam green band  Seated gastroc stretch with strap x 3 Seated soleus stretch with strap x 3  Supine hamstring stretch Passive ankle DF, INV, EV Supine ankle EV  isometric Supine ankle circles Updated HEP   Modalities: Ice pack to left ankle and left knee Self Care: Benefits of RICE to calm down high level of pain   [facility] Adult PT Treatment:                                                DATE: 05/01/22 Therapeutic Exercise: S/L hip abd x10, limited arnge, weakness Dced S/L clam GTB 2x10 Ankle DF/PF standing 2x10 Standing hip abd 2x10 L SL balance c hand A as needed, difficult Dced Tandem standing each L ankle eversion RTB 3x10 Updated HEP Self Care: Info for a ASO ankle bracefo support with walking                                                                                                                           [facility] Adult PT Treatment:                                                DATE: 04/24/22 Therapeutic Exercise: Developed, instructed in, and pt completed therex as noted in HEP Self Care: Cold pack for pain management, use of ASO ankle brace to minimize lateral ankle/peroneal strain       PATIENT EDUCATION:  Education details: Eval findings, POC, HEP, self care  Person educated: Patient Education method: Explanation, Demonstration, Tactile cues, Verbal cues, and Handouts Education comprehension: verbalized understanding, returned demonstration, verbal cues required, and tactile cues required   HOME EXERCISE PROGRAM: Access Code: [email or web address] URL: [email or web address] Date: 05/01/2022 Prepared by: Gar Ponto  Exercises - Active Straight Leg Raise with Quad Set  - 1 x daily - 7 x weekly - 3 sets - 10 reps - 3 hold - Standing Hip Abduction with Counter Support  - 1 x daily - 7 x weekly - 3 sets - 10 reps - 3 hold - Standing Tandem Balance with Counter Support  - 1 x daily - 7 x weekly - 1 sets - 10 reps - 30-60 hold - Clamshell with Resistance  - 1 x daily - 7 x weekly - 3 sets - 10 reps - 3 hold - Seated Ankle Eversion with Resistance  - 1 x daily - 7 x weekly - 3 sets - 10 reps - 3 hold Added 05/09/22 -  Seated Gastroc Stretch with Strap  - 1 x daily - 7 x weekly - 1 sets - 3 reps - 30 hold - Seated Soleus Stretch  with Strap  - 1 x daily - 7 x weekly - 1 sets - 3 reps - 30 hold - Supine Ankle Circles  - 1 x daily - 7 x weekly - 1 sets - 3 reps - 30 hold   ASSESSMENT:   CLINICAL IMPRESSION:Pain 5/10 this morning and located along left peroneal muscles.  He has been icing more. Still walking the dogs and wearing brace some. He is still painful with eversion and supination movements. Performed STW in prone with palpable taut muscles felt in peroneal tendon and medial calf. At end of session he reported no change. Pt tolerated PT today without adverse effects. Pt will continue to benefit from skilled PT to address impairments for improved function with less pain.      OBJECTIVE IMPAIRMENTS: Abnormal gait, decreased activity tolerance, difficulty walking, decreased strength, obesity, and pain.    ACTIVITY LIMITATIONS: carrying, lifting, bending, sitting, standing, squatting, sleeping, stairs, and locomotion level   PARTICIPATION LIMITATIONS: meal prep, cleaning, and laundry   PERSONAL FACTORS: High BMI, Hx of R TKA are also affecting patient's functional outcome.    REHAB POTENTIAL: Good   CLINICAL DECISION MAKING: Stable/uncomplicated   EVALUATION COMPLEXITY: Low     GOALS:   SHORT TERM GOALS: Target date: 05/16/22 Pt will be Ind in an initial HEP  Baseline: Initiated Goal status: INITIAL   2.  Pt will voice understanding of measures to assist in pain reduction  Baseline: initaited Goal status: INITIAL   LONG TERM GOALS: Target date: 06/13/22   Pt will be Ind in a final HEP to maintain achieved LOF  Baseline: initiated Goal status: INITIAL   2.  Increase L hip and ankle strength to 4/5 to improve gait and decrease L ankle pain Baseline: see flow sheets Goal status: INITIAL   3.  Pt will report a decrease in L ankle pain to 0-4/10 for improved function and QOL Baseline:  3-10/10 Goal status: INITIAL   4.  Improve 5xSTS by MCID of 5" and 2MWT by MCID of 57ft as indication of improved functional mobility and quality of gait with reduce antalgic/trendelenberg gait Baseline: TBA Goal status: INITIAL   5.  Pt's FOTO score will improved to the predicted value of 70% as indication of improved function  Baseline: 54% Goal status: INITIAL       PLAN:   PT FREQUENCY: 2x/week   PT DURATION: 6 weeks   PLANNED INTERVENTIONS: Therapeutic exercises, Therapeutic activity, Neuromuscular re-education, Balance training, Gait training, Patient/Family education, Self Care, Joint mobilization, Stair training, Aquatic Therapy, Dry Needling, Electrical stimulation, Cryotherapy, Moist heat, Taping, Vasopneumatic device, Ultrasound, Ionotophoresis 4mg /ml Dexamethasone, Manual therapy, and Re-evaluation   PLAN FOR NEXT SESSION: Review FOTO; assess response to HEP; progress therex as indicated; use of modalities, manual therapy; no TPDN (needle phobia)   Hessie Diener, PTA 05/13/22 10:56 AM Phone: [phone] Fax: [phone]

## 2022-05-15 ENCOUNTER — Ambulatory Visit: Payer: PPO | Admitting: Physical Therapy

## 2022-05-15 ENCOUNTER — Encounter: Payer: Self-pay | Admitting: Physical Therapy

## 2022-05-15 DIAGNOSIS — R262 Difficulty in walking, not elsewhere classified: Secondary | ICD-10-CM

## 2022-05-15 DIAGNOSIS — M25572 Pain in left ankle and joints of left foot: Secondary | ICD-10-CM

## 2022-05-15 DIAGNOSIS — M6281 Muscle weakness (generalized): Secondary | ICD-10-CM

## 2022-05-15 NOTE — Therapy (Signed)
OUTPATIENT PHYSICAL THERAPY TREATMENT NOTE   Patient Name: Charlis Stuchell MRN: [id number] DOB:[birth year], 67 y.o., male Today's Date: 05/15/2022  PCP: [name], MD  REFERRING PROVIDER: Gregor Hams, MD   END OF SESSION:   PT End of Session - 05/15/22 0847     Visit Number 5    Number of Visits 13    Date for PT Re-Evaluation 06/13/22    Authorization Type [employer]    Progress Note Due on Visit 10    PT Start Time 0845    PT Stop Time 0935    PT Time Calculation (min) 50 min             Past Medical History:  Diagnosis Date   ALLERGIC RHINITIS 01/14/2007   Allergy    ANXIETY 09/28/2006   Asbestos exposure    Atrial fibrillation ([place]) 09/28/2006   s/p afib ablation x 2   CHF (congestive heart failure) ([place])    CKD (chronic kidney disease), stage III ([place])    patient denies   COPD (chronic obstructive pulmonary disease) ([place])    Coronary artery disease [id number]   Diastolic dysfunction [id number]   Dyspnea    W/ EXERTION    Dysrhythmia    AFIB      First degree AV block 10/12/2018   Noted on EKG   Gallstones    Hyperglycemia    HYPERLIPIDEMIA 09/28/2006   HYPERTENSION 09/28/2006   HYPOTHYROIDISM, ACQUIRED NEC 09/28/2006   pt denies and doesn't know   Long term current use of anticoagulant 04/08/2010   Obese    OBSTRUCTIVE SLEEP APNEA 01/06/2008   NOT USING     Right knee DJD 07/09/2010   S/P CABG x 1 11/26/2011   Right internal mammary artery to right coronary artery   S/P Maze operation for atrial fibrillation 11/26/2011   complete biatrial lesion set with clipping of LA appendage   Sleep apnea    Past Surgical History:  Procedure Laterality Date   ANTERIOR CERVICAL DECOMP/DISCECTOMY FUSION N/A 06/20/2016   Procedure: Anterior Cervical Discectomy Fusion - Cervical five-Cervical six - Cervical six-Cervical seven - Cervicla seven- Thoracic one;  Surgeon: Earnie Larsson, MD;  Location: [place];  Service: Neurosurgery;  Laterality: N/A;    CARDIOVERSION     X4    CORONARY ARTERY BYPASS GRAFT  11/26/2011   Procedure: CORONARY ARTERY BYPASS GRAFTING (CABG);  Surgeon: Rexene Alberts, MD;  Location: [place];  Service: Open Heart Surgery;  Laterality: N/A;  coronary artery bypass on pump times one utilizing the right internal mammary artery, transesophageal echocardiogram    HAND SURGERY Left    KNEE ARTHROSCOPY Right 12/10/2021   Procedure: RIGH TKNEE ARTHROSCOPY WITH SCAR BAND EXCISION;  Surgeon: Melrose Nakayama, MD;  Location: [facility];  Service: Orthopedics;  Laterality: Right;   LEFT HEART CATHETERIZATION WITH CORONARY ANGIOGRAM N/A 11/24/2011   Procedure: LEFT HEART CATHETERIZATION WITH CORONARY ANGIOGRAM;  Surgeon: Peter M Martinique, MD;  Location[facility] CATH LAB;  Service: Cardiovascular;  Laterality: N/A;   LOOP RECORDER IMPLANT N/A 07/06/2012   Procedure: LOOP RECORDER IMPLANT;  Surgeon: Thompson Grayer, MD;  Location[facility] CATH LAB;  Service: Cardiovascular;  Laterality: N/A;   MAZE  11/26/2011   Procedure: MAZE;  Surgeon: Rexene Alberts, MD;  Location: [place];  Service: Open Heart Surgery;  Laterality: N/A;  Cryomaze    Radiofrequency Ablation for atrial fibrillation  12/30/07 and 06/05/09   afib ablation x 2 by Greggory Brandy  Radiofrequency ablation for atrial flutter  2005   CTI   TOTAL KNEE ARTHROPLASTY Right 04/05/2019   Procedure: RIGHT TOTAL KNEE ARTHROPLASTY;  Surgeon: Melrose Nakayama, MD;  Location: [facility];  Service: Orthopedics;  Laterality: Right;   Patient Active Problem List   Diagnosis Date Noted   Left ankle pain 04/07/2022   Left cataract 12/30/2021   Rash 11/14/2021   Axillary lymphadenopathy 11/14/2021   Low back pain 01/01/2021   Lower GI bleeding 01/01/2021   Pain due to total right knee replacement ([place]) 01/09/2020   Primary osteoarthritis of right knee 04/05/2019   CKD (chronic kidney disease) stage 3, GFR 30-59 ml/min (HCC) 03/14/2019   Degenerative joint disease of knee, right 09/27/2018   Dyspnea 09/06/2018   Right knee  pain 09/06/2018   Radiculitis of left cervical region 03/05/2018   Plantar fasciitis, left 08/19/2017   Chest pain 02/27/2017   Right ear pain 02/27/2017   Left groin pain 09/13/2016   Rib pain on left side 09/13/2016   Left leg pain 09/13/2016   Stenosis of cervical spine with myelopathy ([place]) 06/20/2016   Special screening for malignant neoplasms, colon 04/04/2016   Cough 01/11/2016   COPD exacerbation ([place]) 12/22/2015   Rectal bleeding 08/14/2014   Intercostal muscle strain 08/07/2014   Chronic meniscal tear of knee 04/06/2014   Heel bone fracture 04/06/2014   Hyperglycemia 04/04/2014   Recurrent falls 04/04/2014   Bilateral knee pain 04/04/2014   Intractable left heel pain 04/04/2014   Insomnia 04/04/2014   Hematochezia 04/04/2014   Bladder neck obstruction 12/19/2013   Encounter for therapeutic drug monitoring 03/30/2013   Palpitations 07/06/2012   Shortness of breath 01/19/2012   Sick sinus syndrome ([place]) 12/01/2011   S/P CABG x 1 11/26/2011   S/P Maze operation for atrial fibrillation 11/26/2011   CAD (coronary artery disease) 11/25/2011   Paroxysmal atrial fibrillation ([place]) 11/24/2011   Chronic combined systolic (congestive) and diastolic (congestive) heart failure ([place]) 11/24/2011   Gross hematuria 12/12/2010   Left knee pain 12/12/2010   Left knee DJD 07/09/2010   Encounter for well adult exam with abnormal findings 07/07/2010   Chronic anticoagulation 04/08/2010   COPD (chronic obstructive pulmonary disease) ([place]) 10/04/2008   OBSTRUCTIVE SLEEP APNEA 01/06/2008   ALLERGIC RHINITIS 01/14/2007   Hypothyroidism 09/28/2006   Hyperlipidemia 09/28/2006   Anxiety state 09/28/2006   Essential hypertension 09/28/2006    REFERRING DIAG: M25.572,G89.29 (ICD-10-CM) - Chronic pain of left ankle    THERAPY DIAG:  Pain in left ankle and joints of left foot  Difficulty in walking, not elsewhere classified  Muscle weakness (generalized)  Rationale for Evaluation and  Treatment Rehabilitation  ONSET DATE: Approx 8 months   SUBJECTIVE:    SUBJECTIVE STATEMENT: Pt reports his L ankle is not hurting but the side of the lower leg hurts, 5-6/10. I think the pain is getting better. It didn't hurt yesterday morning but it did this morning.    PAIN:  Are you having pain? Yes: NPRS scale: 5-6/10 Pain location: L lateral ankle  Pain description: Sharp Aggravating factors: Walking, certain positions Relieving factors: Tylenol, tramadol, rest, cold pack Pain range: 3-10/10  PERTINENT HISTORY: High BMI, Hx of R TKA   PRECAUTIONS: None   WEIGHT BEARING RESTRICTIONS: No   FALLS:  Has patient fallen in last 6 months? Yes. Number of falls 2 L Ankle giving way, or dogs pulling on him   LIVING ENVIRONMENT: Lives with: lives alone Lives in: House/apartment Able to access and be  mobile in home   OCCUPATION: 1 day a week, stands all day   PLOF: Independent   PATIENT GOALS: Less pain and to walk better   NEXT MD VISIT: Not currently scheduled   OBJECTIVE: (objective measures completed at initial evaluation unless otherwise dated)   DIAGNOSTIC FINDINGS: 04/08/22 DG ankle FINDINGS: There is no evidence of fracture, dislocation, or joint effusion. There is no evidence of arthropathy or other focal bone abnormality. Soft tissues are unremarkable. There are posterior and plantar calcaneal spurs.   IMPRESSION: Calcaneal spurs.  No acute osseous abnormalities.   04/08/22/ [facility] Ultrasound of: Left ankle Mild joint effusion present at lateral ankle. Posterior tibialis tendons and peroneal tendons are normal-appearing without tear or severe tenosynovitis. Impression: Mild joint effusion   PATIENT SURVEYS:  FOTO: Perceived function   54%, predicted   70%    COGNITION: Overall cognitive status: Within functional limits for tasks assessed                         SENSATION: WFL   EDEMA:  None palpated or observed   MUSCLE  LENGTH: Hamstrings: Right NT deg; Left NT deg Marcello Moores test: Right NT deg; Left NT deg   POSTURE: rounded shoulders, forward head, decreased lumbar lordosis, and increased thoracic kyphosis   PALPATION: TTP along the peroneal muscles and tendons   LOWER EXTREMITY ROM: Grossly WNLs, assess further as needed Active ROM Right eval Left eval  Hip flexion      Hip extension      Hip abduction      Hip adduction      Hip internal rotation      Hip external rotation      Knee flexion      Knee extension      Ankle dorsiflexion      Ankle plantarflexion      Ankle inversion      Ankle eversion       (Blank rows = not tested)   LOWER EXTREMITY MMT:   MMT Right eval Left eval  Hip flexion 3 2+  Hip extension 3 2+  Hip abduction 3+ 2  Hip adduction      Hip internal rotation      Hip external rotation 4 3  Knee flexion 4+ 4+  Knee extension 4+ 4+  Ankle dorsiflexion   3 p  Ankle plantarflexion   2 p  Ankle inversion   2 p  Ankle eversion   2 p   (Blank rows = not tested)   LOWER EXTREMITY SPECIAL TESTS:  Ankle special tests: NT   FUNCTIONAL TESTS:  5 times sit to stand: : 05/15/22: 17.12 sec  2 minute walk test: TBA:    GAIT: Distance walked: 200' Assistive device utilized: None Level of assistance: Complete Independence Comments: Antalgic and Trendelenberg gait, bilaterally L>R     TODAY'S TREATMENT:   [facility] Adult PT Treatment:                                                DATE: 05/15/22 Therapeutic Exercise: Standing gastroc stretch Seated baps L2 taps to circles each way Seated blue band PF  Long sitting ankle eversion/blue left 5 sec x 10  Manual Therapy: STW to calf in prone Passive calf stretched Prone  Modalities: Ice pack lateral left  lower leg and ankle     [facility] Adult PT Treatment:                                                DATE: 05/13/22 Therapeutic Exercise: Seated Baps Board L2 progressed to circles each way Ankle eversion red band 10  x 2  Seated heel raises 10# on knee 10 x 2  Seated Gastroc stretch 30 sec x 3  Seated soleus stretch 30 sec x 3   Manual Therapy: STW to calf in prone Passive calf stretched Prone    [facility] Adult PT Treatment:                                                DATE: 05/09/22 Therapeutic Exercise: Seated hip flexion, alt x 10  Seated clam green band  Seated gastroc stretch with strap x 3 Seated soleus stretch with strap x 3  Supine hamstring stretch Passive ankle DF, INV, EV Supine ankle EV isometric Supine ankle circles Updated HEP   Modalities: Ice pack to left ankle and left knee Self Care: Benefits of RICE to calm down high level of pain   [facility] Adult PT Treatment:                                                DATE: 05/01/22 Therapeutic Exercise: S/L hip abd x10, limited arnge, weakness Dced S/L clam GTB 2x10 Ankle DF/PF standing 2x10 Standing hip abd 2x10 L SL balance c hand A as needed, difficult Dced Tandem standing each L ankle eversion RTB 3x10 Updated HEP Self Care: Info for a ASO ankle bracefo support with walking                                                                                                                           [facility] Adult PT Treatment:                                                DATE: 04/24/22 Therapeutic Exercise: Developed, instructed in, and pt completed therex as noted in HEP Self Care: Cold pack for pain management, use of ASO ankle brace to minimize lateral ankle/peroneal strain       PATIENT EDUCATION:  Education details: Eval findings, POC, HEP, self care  Person educated: Patient Education method: Explanation, Demonstration, Tactile cues, Verbal cues, and Handouts Education comprehension: verbalized understanding, returned demonstration, verbal cues required, and tactile cues  required   HOME EXERCISE PROGRAM: Access Code: [email or web address] URL: [email or web address] Date: 05/01/2022 Prepared by: Gar Ponto  Exercises - Active Straight Leg Raise with Quad Set  - 1 x daily - 7 x weekly - 3 sets - 10 reps - 3 hold - Standing Hip Abduction with Counter Support  - 1 x daily - 7 x weekly - 3 sets - 10 reps - 3 hold - Standing Tandem Balance with Counter Support  - 1 x daily - 7 x weekly - 1 sets - 10 reps - 30-60 hold - Clamshell with Resistance  - 1 x daily - 7 x weekly - 3 sets - 10 reps - 3 hold - Seated Ankle Eversion with Resistance  - 1 x daily - 7 x weekly - 3 sets - 10 reps - 3 hold Added 05/09/22 - Seated Gastroc Stretch with Strap  - 1 x daily - 7 x weekly - 1 sets - 3 reps - 30 hold - Seated Soleus Stretch with Strap  - 1 x daily - 7 x weekly - 1 sets - 3 reps - 30 hold - Supine Ankle Circles  - 1 x daily - 7 x weekly - 1 sets - 3 reps - 30 hold   ASSESSMENT:   CLINICAL IMPRESSION:Pain 5-6/10 this morning and located along left peroneal muscles. He did reports less pain after last session and minimal pain yesterday. Pain has returned today and is located same area, lateral lower leg and ankle. He has palpable tautness in peroneals and medial calf that were softened with STW. Considered iontophoresis patch however patient has thick hair on lower legs. 5 x STS 17.2 sec. Began standing gastroc stretch with good tolerance and added to HEP.   At end of session he reported no change. Pt tolerated PT today without adverse effects. Pt will continue to benefit from skilled PT to address impairments for improved function with less pain.      OBJECTIVE IMPAIRMENTS: Abnormal gait, decreased activity tolerance, difficulty walking, decreased strength, obesity, and pain.    ACTIVITY LIMITATIONS: carrying, lifting, bending, sitting, standing, squatting, sleeping, stairs, and locomotion level   PARTICIPATION LIMITATIONS: meal prep, cleaning, and laundry   PERSONAL FACTORS: High BMI, Hx of R TKA are also affecting patient's functional outcome.    REHAB POTENTIAL: Good   CLINICAL DECISION MAKING:  Stable/uncomplicated   EVALUATION COMPLEXITY: Low     GOALS:   SHORT TERM GOALS: Target date: 05/16/22 Pt will be Ind in an initial HEP  Baseline: Initiated Goal status: INITIAL   2.  Pt will voice understanding of measures to assist in pain reduction  Baseline: initaited Goal status: INITIAL   LONG TERM GOALS: Target date: 06/13/22   Pt will be Ind in a final HEP to maintain achieved LOF  Baseline: initiated Goal status: INITIAL   2.  Increase L hip and ankle strength to 4/5 to improve gait and decrease L ankle pain Baseline: see flow sheets Goal status: INITIAL   3.  Pt will report a decrease in L ankle pain to 0-4/10 for improved function and QOL Baseline: 3-10/10 Goal status: INITIAL   4.  Improve 5xSTS by MCID of 5" and 2MWT by MCID of 87ft as indication of improved functional mobility and quality of gait with reduce antalgic/trendelenberg gait Baseline: TBA Goal status: INITIAL   5.  Pt's FOTO score will improved to the predicted value of 70% as indication of improved function  Baseline: 54% Goal status: INITIAL  PLAN:   PT FREQUENCY: 2x/week   PT DURATION: 6 weeks   PLANNED INTERVENTIONS: Therapeutic exercises, Therapeutic activity, Neuromuscular re-education, Balance training, Gait training, Patient/Family education, Self Care, Joint mobilization, Stair training, Aquatic Therapy, Dry Needling, Electrical stimulation, Cryotherapy, Moist heat, Taping, Vasopneumatic device, Ultrasound, Ionotophoresis 4mg /ml Dexamethasone, Manual therapy, and Re-evaluation   PLAN FOR NEXT SESSION:Review FOTO; assess response to HEP; progress therex as indicated; use of modalities, manual therapy; no TPDN (needle phobia), possible ionto?   Hessie Diener, PTA 05/15/22 11:53 AM Phone: [phone] Fax: [phone]

## 2022-05-20 ENCOUNTER — Encounter: Payer: Self-pay | Admitting: Physical Therapy

## 2022-05-20 ENCOUNTER — Ambulatory Visit: Payer: PPO | Admitting: Physical Therapy

## 2022-05-20 DIAGNOSIS — M25572 Pain in left ankle and joints of left foot: Secondary | ICD-10-CM | POA: Diagnosis not present

## 2022-05-20 DIAGNOSIS — M6281 Muscle weakness (generalized): Secondary | ICD-10-CM

## 2022-05-20 DIAGNOSIS — R262 Difficulty in walking, not elsewhere classified: Secondary | ICD-10-CM

## 2022-05-20 NOTE — Therapy (Signed)
OUTPATIENT PHYSICAL THERAPY TREATMENT NOTE   Patient Name: Scott [name] MRN: [id number] DOB:1955/08/21, 67 y.o., male Today's Date: 05/20/2022  PCP: [name], MD  REFERRING PROVIDER: Gregor Hams, MD   END OF SESSION:   PT End of Session - 05/20/22 0720     Visit Number 6    Number of Visits 13    Date for PT Re-Evaluation 06/13/22    Authorization Type [employer]    Progress Note Due on Visit 10    PT Start Time 0718    PT Stop Time 0800    PT Time Calculation (min) 42 min             Past Medical History:  Diagnosis Date   ALLERGIC RHINITIS 01/14/2007   Allergy    ANXIETY 09/28/2006   Asbestos exposure    Atrial fibrillation ([place]) 09/28/2006   s/p afib ablation x 2   CHF (congestive heart failure) ([place])    CKD (chronic kidney disease), stage III ([place])    patient denies   COPD (chronic obstructive pulmonary disease) ([place])    Coronary artery disease [id number]   Diastolic dysfunction [id number]   Dyspnea    W/ EXERTION    Dysrhythmia    AFIB      First degree AV block 10/12/2018   Noted on EKG   Gallstones    Hyperglycemia    HYPERLIPIDEMIA 09/28/2006   HYPERTENSION 09/28/2006   HYPOTHYROIDISM, ACQUIRED NEC 09/28/2006   pt denies and doesn't know   Long term current use of anticoagulant 04/08/2010   Obese    OBSTRUCTIVE SLEEP APNEA 01/06/2008   NOT USING     Right knee DJD 07/09/2010   S/P CABG x 1 11/26/2011   Right internal mammary artery to right coronary artery   S/P Maze operation for atrial fibrillation 11/26/2011   complete biatrial lesion set with clipping of LA appendage   Sleep apnea    Past Surgical History:  Procedure Laterality Date   ANTERIOR CERVICAL DECOMP/DISCECTOMY FUSION N/A 06/20/2016   Procedure: Anterior Cervical Discectomy Fusion - Cervical five-Cervical six - Cervical six-Cervical seven - Cervicla seven- Thoracic one;  Surgeon: Earnie Larsson, MD;  Location: [facility];  Service: Neurosurgery;  Laterality: N/A;    CARDIOVERSION     X4    CORONARY ARTERY BYPASS GRAFT  11/26/2011   Procedure: CORONARY ARTERY BYPASS GRAFTING (CABG);  Surgeon: Rexene Alberts, MD;  Location: [facility];  Service: Open Heart Surgery;  Laterality: N/A;  coronary artery bypass on pump times one utilizing the right internal mammary artery, transesophageal echocardiogram    HAND SURGERY Left    KNEE ARTHROSCOPY Right 12/10/2021   Procedure: RIGH TKNEE ARTHROSCOPY WITH SCAR BAND EXCISION;  Surgeon: Melrose Nakayama, MD;  Location: [facility];  Service: Orthopedics;  Laterality: Right;   LEFT HEART CATHETERIZATION WITH CORONARY ANGIOGRAM N/A 11/24/2011   Procedure: LEFT HEART CATHETERIZATION WITH CORONARY ANGIOGRAM;  Surgeon: Peter M Martinique, MD;  Location: [facility] CATH LAB;  Service: Cardiovascular;  Laterality: N/A;   LOOP RECORDER IMPLANT N/A 07/06/2012   Procedure: LOOP RECORDER IMPLANT;  Surgeon: Thompson Grayer, MD;  Location: [facility] CATH LAB;  Service: Cardiovascular;  Laterality: N/A;   MAZE  11/26/2011   Procedure: MAZE;  Surgeon: Rexene Alberts, MD;  Location: [facility];  Service: Open Heart Surgery;  Laterality: N/A;  Cryomaze    Radiofrequency Ablation for atrial fibrillation  12/30/07 and 06/05/09   afib ablation x 2 by Greggory Brandy  Radiofrequency ablation for atrial flutter  2005   CTI   TOTAL KNEE ARTHROPLASTY Right 04/05/2019   Procedure: RIGHT TOTAL KNEE ARTHROPLASTY;  Surgeon: Melrose Nakayama, MD;  Location: [facility];  Service: Orthopedics;  Laterality: Right;   Patient Active Problem List   Diagnosis Date Noted   Left ankle pain 04/07/2022   Left cataract 12/30/2021   Rash 11/14/2021   Axillary lymphadenopathy 11/14/2021   Low back pain 01/01/2021   Lower GI bleeding 01/01/2021   Pain due to total right knee replacement ([place]) 01/09/2020   Primary osteoarthritis of right knee 04/05/2019   CKD (chronic kidney disease) stage 3, GFR 30-59 ml/min (HCC) 03/14/2019   Degenerative joint disease of knee, right 09/27/2018   Dyspnea 09/06/2018   Right knee  pain 09/06/2018   Radiculitis of left cervical region 03/05/2018   Plantar fasciitis, left 08/19/2017   Chest pain 02/27/2017   Right ear pain 02/27/2017   Left groin pain 09/13/2016   Rib pain on left side 09/13/2016   Left leg pain 09/13/2016   Stenosis of cervical spine with myelopathy ([place]) 06/20/2016   Special screening for malignant neoplasms, colon 04/04/2016   Cough 01/11/2016   COPD exacerbation ([place]) 12/22/2015   Rectal bleeding 08/14/2014   Intercostal muscle strain 08/07/2014   Chronic meniscal tear of knee 04/06/2014   Heel bone fracture 04/06/2014   Hyperglycemia 04/04/2014   Recurrent falls 04/04/2014   Bilateral knee pain 04/04/2014   Intractable left heel pain 04/04/2014   Insomnia 04/04/2014   Hematochezia 04/04/2014   Bladder neck obstruction 12/19/2013   Encounter for therapeutic drug monitoring 03/30/2013   Palpitations 07/06/2012   Shortness of breath 01/19/2012   Sick sinus syndrome ([place]) 12/01/2011   S/P CABG x 1 11/26/2011   S/P Maze operation for atrial fibrillation 11/26/2011   CAD (coronary artery disease) 11/25/2011   Paroxysmal atrial fibrillation ([place]) 11/24/2011   Chronic combined systolic (congestive) and diastolic (congestive) heart failure ([place]) 11/24/2011   Gross hematuria 12/12/2010   Left knee pain 12/12/2010   Left knee DJD 07/09/2010   Encounter for well adult exam with abnormal findings 07/07/2010   Chronic anticoagulation 04/08/2010   COPD (chronic obstructive pulmonary disease) ([place]) 10/04/2008   OBSTRUCTIVE SLEEP APNEA 01/06/2008   ALLERGIC RHINITIS 01/14/2007   Hypothyroidism 09/28/2006   Hyperlipidemia 09/28/2006   Anxiety state 09/28/2006   Essential hypertension 09/28/2006    REFERRING DIAG: M25.572,G89.29 (ICD-10-CM) - Chronic pain of left ankle    THERAPY DIAG:  Pain in left ankle and joints of left foot  Difficulty in walking, not elsewhere classified  Muscle weakness (generalized)  Rationale for Evaluation and  Treatment Rehabilitation  ONSET DATE: Approx 8 months   SUBJECTIVE:    SUBJECTIVE STATEMENT: Pt reports his L ankle is still hurting and the pain is moving up to the side of his knee. The pain is also waking him up at night. He rates pain 9-10/10.     PAIN:  Are you having pain? Yes: NPRS scale: 9-10/10 Pain location: L lateral ankle  Pain description: Sharp Aggravating factors: Walking, certain positions Relieving factors: Tylenol, tramadol, rest, cold pack Pain range: 3-10/10  PERTINENT HISTORY: High BMI, Hx of R TKA   PRECAUTIONS: None   WEIGHT BEARING RESTRICTIONS: No   FALLS:  Has patient fallen in last 6 months? Yes. Number of falls 2 L Ankle giving way, or dogs pulling on him   LIVING ENVIRONMENT: Lives with: lives alone Lives in: House/apartment Able to access and be  mobile in home   OCCUPATION: 1 day a week, stands all day   PLOF: Independent   PATIENT GOALS: Less pain and to walk better   NEXT MD VISIT: Not currently scheduled   OBJECTIVE: (objective measures completed at initial evaluation unless otherwise dated)   DIAGNOSTIC FINDINGS: 04/08/22 DG ankle FINDINGS: There is no evidence of fracture, dislocation, or joint effusion. There is no evidence of arthropathy or other focal bone abnormality. Soft tissues are unremarkable. There are posterior and plantar calcaneal spurs.   IMPRESSION: Calcaneal spurs.  No acute osseous abnormalities.   04/08/22/ [facility] Ultrasound of: Left ankle Mild joint effusion present at lateral ankle. Posterior tibialis tendons and peroneal tendons are normal-appearing without tear or severe tenosynovitis. Impression: Mild joint effusion   PATIENT SURVEYS:  FOTO: Perceived function   54%, predicted   70%    COGNITION: Overall cognitive status: Within functional limits for tasks assessed                         SENSATION: WFL   EDEMA:  None palpated or observed   MUSCLE LENGTH: Hamstrings: Right  NT deg; Left NT deg Marcello Moores test: Right NT deg; Left NT deg   POSTURE: rounded shoulders, forward head, decreased lumbar lordosis, and increased thoracic kyphosis   PALPATION: TTP along the peroneal muscles and tendons   LOWER EXTREMITY ROM: Grossly WNLs, assess further as needed Active ROM Right eval Left eval  Hip flexion      Hip extension      Hip abduction      Hip adduction      Hip internal rotation      Hip external rotation      Knee flexion      Knee extension      Ankle dorsiflexion      Ankle plantarflexion      Ankle inversion      Ankle eversion       (Blank rows = not tested)   LOWER EXTREMITY MMT:   MMT Right eval Left eval  Hip flexion 3 2+  Hip extension 3 2+  Hip abduction 3+ 2  Hip adduction      Hip internal rotation      Hip external rotation 4 3  Knee flexion 4+ 4+  Knee extension 4+ 4+  Ankle dorsiflexion   3 p  Ankle plantarflexion   2 p  Ankle inversion   2 p  Ankle eversion   2 p   (Blank rows = not tested)   LOWER EXTREMITY SPECIAL TESTS:  Ankle special tests: NT   FUNCTIONAL TESTS:  5 times sit to stand: : 05/15/22: 17.12 sec  2 minute walk test: TBA:    GAIT: Distance walked: 200' Assistive device utilized: None Level of assistance: Complete Independence Comments: Antalgic and Trendelenberg gait, bilaterally L>R     TODAY'S TREATMENT:   [facility] Adult PT Treatment:                                                DATE: 05/20/22 Therapeutic Exercise: Wooden rocker standing, increased pain Stepping on and off airex, increased pain Standing gastroc stretch - min cues  Seated red band 4 way ankle x 10 eah Seated gastroc and soleus stretch with towel  Modalities: Iontophoresis patch with 1 ML dex  to lateral left ankle     [facility] Adult PT Treatment:                                                DATE: 05/15/22 Therapeutic Exercise: Standing gastroc stretch Seated baps L2 taps to circles each way Seated blue band PF  Long  sitting ankle eversion/blue left 5 sec x 10  Manual Therapy: STW to calf in prone Passive calf stretched Prone  Modalities: Ice pack lateral left lower leg and ankle     [facility] Adult PT Treatment:                                                DATE: 05/13/22 Therapeutic Exercise: Seated Baps Board L2 progressed to circles each way Ankle eversion red band 10 x 2  Seated heel raises 10# on knee 10 x 2  Seated Gastroc stretch 30 sec x 3  Seated soleus stretch 30 sec x 3   Manual Therapy: STW to calf in prone Passive calf stretched Prone    [facility] Adult PT Treatment:                                                DATE: 05/09/22 Therapeutic Exercise: Seated hip flexion, alt x 10  Seated clam green band  Seated gastroc stretch with strap x 3 Seated soleus stretch with strap x 3  Supine hamstring stretch Passive ankle DF, INV, EV Supine ankle EV isometric Supine ankle circles Updated HEP   Modalities: Ice pack to left ankle and left knee Self Care: Benefits of RICE to calm down high level of pain   [facility] Adult PT Treatment:                                                DATE: 05/01/22 Therapeutic Exercise: S/L hip abd x10, limited arnge, weakness Dced S/L clam GTB 2x10 Ankle DF/PF standing 2x10 Standing hip abd 2x10 L SL balance c hand A as needed, difficult Dced Tandem standing each L ankle eversion RTB 3x10 Updated HEP Self Care: Info for a ASO ankle bracefo support with walking                                                                                                                           [facility] Adult PT Treatment:  DATE: 04/24/22 Therapeutic Exercise: Developed, instructed in, and pt completed therex as noted in HEP Self Care: Cold pack for pain management, use of ASO ankle brace to minimize lateral ankle/peroneal strain       PATIENT EDUCATION:  Education details: Eval findings, POC, HEP, self care  Person  educated: Patient Education method: Explanation, Demonstration, Tactile cues, Verbal cues, and Handouts Education comprehension: verbalized understanding, returned demonstration, verbal cues required, and tactile cues required   HOME EXERCISE PROGRAM: Access Code: [email or web address] URL: [email or web address] Date: 05/01/2022 Prepared by: Gar Ponto  Exercises - Active Straight Leg Raise with Quad Set  - 1 x daily - 7 x weekly - 3 sets - 10 reps - 3 hold - Standing Hip Abduction with Counter Support  - 1 x daily - 7 x weekly - 3 sets - 10 reps - 3 hold - Standing Tandem Balance with Counter Support  - 1 x daily - 7 x weekly - 1 sets - 10 reps - 30-60 hold - Clamshell with Resistance  - 1 x daily - 7 x weekly - 3 sets - 10 reps - 3 hold - Seated Ankle Eversion with Resistance  - 1 x daily - 7 x weekly - 3 sets - 10 reps - 3 hold Added 05/09/22 - Seated Gastroc Stretch with Strap  - 1 x daily - 7 x weekly - 1 sets - 3 reps - 30 hold - Seated Soleus Stretch with Strap  - 1 x daily - 7 x weekly - 1 sets - 3 reps - 30 hold - Supine Ankle Circles  - 1 x daily - 7 x weekly - 1 sets - 3 reps - 30 hold   ASSESSMENT:   CLINICAL IMPRESSION:Pain 9-10/10 this morning and located along left peroneal muscles. He did report increased pain at night, waking up 3 x last night. He is able to voice pain management strategies, ice, heat and activity modification. He is performing his HEP regularly. STG# 1, #2 met. Attempted closed chain rocker board with increased pain. Reduced to open chain ankle strength and update HEP. Reviewed gastroc stretch in closed chain. Began Trial of Iontophoresis to lateral ankle. Will assess response to ionto next visit.  At end of session he reported no change. Pt tolerated PT today without adverse effects. Pt will continue to benefit from skilled PT to address impairments for improved function with less pain.      OBJECTIVE IMPAIRMENTS: Abnormal gait, decreased activity  tolerance, difficulty walking, decreased strength, obesity, and pain.    ACTIVITY LIMITATIONS: carrying, lifting, bending, sitting, standing, squatting, sleeping, stairs, and locomotion level   PARTICIPATION LIMITATIONS: meal prep, cleaning, and laundry   PERSONAL FACTORS: High BMI, Hx of R TKA are also affecting patient's functional outcome.    REHAB POTENTIAL: Good   CLINICAL DECISION MAKING: Stable/uncomplicated   EVALUATION COMPLEXITY: Low     GOALS:   SHORT TERM GOALS: Target date: 05/16/22 Pt will be Ind in an initial HEP  Baseline: Initiated Goal status: MET   2.  Pt will voice understanding of measures to assist in pain reduction  Baseline: initaited Goal status: MET   LONG TERM GOALS: Target date: 06/13/22   Pt will be Ind in a final HEP to maintain achieved LOF  Baseline: initiated Goal status: INITIAL   2.  Increase L hip and ankle strength to 4/5 to improve gait and decrease L ankle pain Baseline: see flow sheets Goal status: INITIAL   3.  Pt will report a  decrease in L ankle pain to 0-4/10 for improved function and QOL Baseline: 3-10/10 Goal status: INITIAL   4.  Improve 5xSTS by MCID of 5" and 2MWT by MCID of 58ft as indication of improved functional mobility and quality of gait with reduce antalgic/trendelenberg gait Baseline: TBA Goal status: INITIAL   5.  Pt's FOTO score will improved to the predicted value of 70% as indication of improved function  Baseline: 54% Goal status: INITIAL       PLAN:   PT FREQUENCY: 2x/week   PT DURATION: 6 weeks   PLANNED INTERVENTIONS: Therapeutic exercises, Therapeutic activity, Neuromuscular re-education, Balance training, Gait training, Patient/Family education, Self Care, Joint mobilization, Stair training, Aquatic Therapy, Dry Needling, Electrical stimulation, Cryotherapy, Moist heat, Taping, Vasopneumatic device, Ultrasound, Ionotophoresis 4mg /ml Dexamethasone, Manual therapy, and Re-evaluation   PLAN FOR  NEXT SESSION:Review FOTO; assess response to HEP; progress therex as indicated; use of modalities, manual therapy; no TPDN (needle phobia), assess response to ionto.   Hessie Diener, PTA 05/20/22 8:22 AM Phone: [phone] Fax: [phone]

## 2022-05-20 NOTE — Patient Instructions (Signed)

## 2022-05-21 NOTE — Therapy (Signed)
OUTPATIENT PHYSICAL THERAPY TREATMENT NOTE   Patient Name: Scott [name] MRN: [id number] DOB:[birth year], 67 y.o., male Today's Date: 05/22/2022  PCP: [name], MD  REFERRING PROVIDER: Gregor Hams, MD   END OF SESSION:   PT End of Session - 05/22/22 0724     Visit Number 7    Number of Visits 13    Date for PT Re-Evaluation 06/13/22    Authorization Type [employer]    Progress Note Due on Visit 10    PT Start Time 0717    PT Stop Time 0800    PT Time Calculation (min) 43 min    Activity Tolerance Patient tolerated treatment well    Behavior During Therapy [facility] for tasks assessed/performed             Past Medical History:  Diagnosis Date   ALLERGIC RHINITIS 01/14/2007   Allergy    ANXIETY 09/28/2006   Asbestos exposure    Atrial fibrillation ([place]) 09/28/2006   s/p afib ablation x 2   CHF (congestive heart failure) ([place])    CKD (chronic kidney disease), stage III ([place])    patient denies   COPD (chronic obstructive pulmonary disease) ([place])    Coronary artery disease [id number]   Diastolic dysfunction [id number]   Dyspnea    W/ EXERTION    Dysrhythmia    AFIB      First degree AV block 10/12/2018   Noted on EKG   Gallstones    Hyperglycemia    HYPERLIPIDEMIA 09/28/2006   HYPERTENSION 09/28/2006   HYPOTHYROIDISM, ACQUIRED NEC 09/28/2006   pt denies and doesn't know   Long term current use of anticoagulant 04/08/2010   Obese    OBSTRUCTIVE SLEEP APNEA 01/06/2008   NOT USING     Right knee DJD 07/09/2010   S/P CABG x 1 11/26/2011   Right internal mammary artery to right coronary artery   S/P Maze operation for atrial fibrillation 11/26/2011   complete biatrial lesion set with clipping of LA appendage   Sleep apnea    Past Surgical History:  Procedure Laterality Date   ANTERIOR CERVICAL DECOMP/DISCECTOMY FUSION N/A 06/20/2016   Procedure: Anterior Cervical Discectomy Fusion - Cervical five-Cervical six - Cervical six-Cervical seven - Cervicla  seven- Thoracic one;  Surgeon: Earnie Larsson, MD;  Location: [facility];  Service: Neurosurgery;  Laterality: N/A;   CARDIOVERSION     X4    CORONARY ARTERY BYPASS GRAFT  11/26/2011   Procedure: CORONARY ARTERY BYPASS GRAFTING (CABG);  Surgeon: Rexene Alberts, MD;  Location: [facility];  Service: Open Heart Surgery;  Laterality: N/A;  coronary artery bypass on pump times one utilizing the right internal mammary artery, transesophageal echocardiogram    HAND SURGERY Left    KNEE ARTHROSCOPY Right 12/10/2021   Procedure: RIGH TKNEE ARTHROSCOPY WITH SCAR BAND EXCISION;  Surgeon: Melrose Nakayama, MD;  Location: [facility];  Service: Orthopedics;  Laterality: Right;   LEFT HEART CATHETERIZATION WITH CORONARY ANGIOGRAM N/A 11/24/2011   Procedure: LEFT HEART CATHETERIZATION WITH CORONARY ANGIOGRAM;  Surgeon: Peter M Martinique, MD;  Location[facility];  Service: Cardiovascular;  Laterality: N/A;   LOOP RECORDER IMPLANT N/A 07/06/2012   Procedure: LOOP RECORDER IMPLANT;  Surgeon: Thompson Grayer, MD;  Location: [facility] CATH LAB;  Service: Cardiovascular;  Laterality: N/A;   MAZE  11/26/2011   Procedure: MAZE;  Surgeon: Rexene Alberts, MD;  Location: [place];  Service: Open Heart Surgery;  Laterality: N/A;  Cryomaze  Radiofrequency Ablation for atrial fibrillation  12/30/07 and 06/05/09   afib ablation x 2 by JA   Radiofrequency ablation for atrial flutter  2005   CTI   TOTAL KNEE ARTHROPLASTY Right 04/05/2019   Procedure: RIGHT TOTAL KNEE ARTHROPLASTY;  Surgeon: Melrose Nakayama, MD;  Location: [facility];  Service: Orthopedics;  Laterality: Right;   Patient Active Problem List   Diagnosis Date Noted   Left ankle pain 04/07/2022   Left cataract 12/30/2021   Rash 11/14/2021   Axillary lymphadenopathy 11/14/2021   Low back pain 01/01/2021   Lower GI bleeding 01/01/2021   Pain due to total right knee replacement ([place]) 01/09/2020   Primary osteoarthritis of right knee 04/05/2019   CKD (chronic kidney disease) stage 3, GFR 30-59  ml/min (HCC) 03/14/2019   Degenerative joint disease of knee, right 09/27/2018   Dyspnea 09/06/2018   Right knee pain 09/06/2018   Radiculitis of left cervical region 03/05/2018   Plantar fasciitis, left 08/19/2017   Chest pain 02/27/2017   Right ear pain 02/27/2017   Left groin pain 09/13/2016   Rib pain on left side 09/13/2016   Left leg pain 09/13/2016   Stenosis of cervical spine with myelopathy ([place]) 06/20/2016   Special screening for malignant neoplasms, colon 04/04/2016   Cough 01/11/2016   COPD exacerbation ([place]) 12/22/2015   Rectal bleeding 08/14/2014   Intercostal muscle strain 08/07/2014   Chronic meniscal tear of knee 04/06/2014   Heel bone fracture 04/06/2014   Hyperglycemia 04/04/2014   Recurrent falls 04/04/2014   Bilateral knee pain 04/04/2014   Intractable left heel pain 04/04/2014   Insomnia 04/04/2014   Hematochezia 04/04/2014   Bladder neck obstruction 12/19/2013   Encounter for therapeutic drug monitoring 03/30/2013   Palpitations 07/06/2012   Shortness of breath 01/19/2012   Sick sinus syndrome ([place]) 12/01/2011   S/P CABG x 1 11/26/2011   S/P Maze operation for atrial fibrillation 11/26/2011   CAD (coronary artery disease) 11/25/2011   Paroxysmal atrial fibrillation ([place]) 11/24/2011   Chronic combined systolic (congestive) and diastolic (congestive) heart failure ([place]) 11/24/2011   Gross hematuria 12/12/2010   Left knee pain 12/12/2010   Left knee DJD 07/09/2010   Encounter for well adult exam with abnormal findings 07/07/2010   Chronic anticoagulation 04/08/2010   COPD (chronic obstructive pulmonary disease) ([place]) 10/04/2008   OBSTRUCTIVE SLEEP APNEA 01/06/2008   ALLERGIC RHINITIS 01/14/2007   Hypothyroidism 09/28/2006   Hyperlipidemia 09/28/2006   Anxiety state 09/28/2006   Essential hypertension 09/28/2006    REFERRING DIAG: M25.572,G89.29 (ICD-10-CM) - Chronic pain of left ankle    THERAPY DIAG:  Pain in left ankle and joints of left  foot  Difficulty in walking, not elsewhere classified  Muscle weakness (generalized)  Rationale for Evaluation and Treatment Rehabilitation  ONSET DATE: Approx 8 months   SUBJECTIVE:    SUBJECTIVE STATEMENT: Pt reports his L ankle is about the same. No noticeable change in pain with iontophoresis. The ASO ankle brace helps to make walking more tolerable.Marland Kitchen     PAIN:  Are you having pain? Yes: NPRS scale: 6-7/10 Pain location: L lateral ankle  Pain description: Sharp Aggravating factors: Walking, certain positions Relieving factors: Tylenol, tramadol, rest, cold pack Pain range: 3-10/10  PERTINENT HISTORY: High BMI, Hx of R TKA   PRECAUTIONS: None   WEIGHT BEARING RESTRICTIONS: No   FALLS:  Has patient fallen in last 6 months? Yes. Number of falls 2 L Ankle giving way, or dogs pulling on him   LIVING ENVIRONMENT:  Lives with: lives alone Lives in: House/apartment Able to access and be mobile in home   OCCUPATION: 1 day a week, stands all day   PLOF: Independent   PATIENT GOALS: Less pain and to walk better   NEXT MD VISIT: Not currently scheduled   OBJECTIVE: (objective measures completed at initial evaluation unless otherwise dated)   DIAGNOSTIC FINDINGS: 04/08/22 DG ankle FINDINGS: There is no evidence of fracture, dislocation, or joint effusion. There is no evidence of arthropathy or other focal bone abnormality. Soft tissues are unremarkable. There are posterior and plantar calcaneal spurs.   IMPRESSION: Calcaneal spurs.  No acute osseous abnormalities.   04/08/22/ [facility] Ultrasound of: Left ankle Mild joint effusion present at lateral ankle. Posterior tibialis tendons and peroneal tendons are normal-appearing without tear or severe tenosynovitis. Impression: Mild joint effusion   PATIENT SURVEYS:  FOTO: Perceived function   54%, predicted   70%    COGNITION: Overall cognitive status: Within functional limits for tasks assessed                          SENSATION: WFL   EDEMA:  None palpated or observed   MUSCLE LENGTH: Hamstrings: Right NT deg; Left NT deg Marcello Moores test: Right NT deg; Left NT deg   POSTURE: rounded shoulders, forward head, decreased lumbar lordosis, and increased thoracic kyphosis   PALPATION: TTP along the peroneal muscles and tendons   LOWER EXTREMITY ROM: Grossly WNLs, assess further as needed Active ROM Right eval Left eval  Hip flexion      Hip extension      Hip abduction      Hip adduction      Hip internal rotation      Hip external rotation      Knee flexion      Knee extension      Ankle dorsiflexion      Ankle plantarflexion      Ankle inversion      Ankle eversion       (Blank rows = not tested)   LOWER EXTREMITY MMT:   MMT Right eval Left eval  Hip flexion 3 2+  Hip extension 3 2+  Hip abduction 3+ 2  Hip adduction      Hip internal rotation      Hip external rotation 4 3  Knee flexion 4+ 4+  Knee extension 4+ 4+  Ankle dorsiflexion   3 p  Ankle plantarflexion   2 p  Ankle inversion   2 p  Ankle eversion   2 p   (Blank rows = not tested)   LOWER EXTREMITY SPECIAL TESTS:  Ankle special tests: NT   FUNCTIONAL TESTS:  5 times sit to stand: : 05/15/22: 17.12 sec  2 minute walk test: TBA:    GAIT: Distance walked: 200' Assistive device utilized: None Level of assistance: Complete Independence Comments: Antalgic and Trendelenberg gait, bilaterally L>R     TODAY'S TREATMENT:   [facility] Adult PT Treatment:                                                DATE: 05/22/22 Therapeutic Exercise: Theraband strengthening for L ankle ever and PF YTB 3x10 S/L hip abd 3x10 S/L hip ER 3x10 SL stance L LE c hand assist Manual Therapy: STM to the lateral  L calf Skilled palpation to identify TrPs and taut muscle bands Trigger Point Dry Needling Treatment: Pre-treatment instruction: Patient instructed on dry needling rationale, procedures, and possible side effects  including pain during treatment (achy,cramping feeling), bruising, drop of blood, lightheadedness, nausea, sweating. Patient Consent Given: Yes Education handout provided: Yes Muscles treated: L peroneal longus and brevis  Needle size and number: .25x60mm x 2 Electrical stimulation performed: No Parameters: N/A Treatment response/outcome: Twitch response elicited Post-treatment instructions: Patient instructed to expect possible mild to moderate muscle soreness later today and/or tomorrow. Patient instructed in methods to reduce muscle soreness and to continue prescribed HEP. If patient was dry needled over the lung field, patient was instructed on signs and symptoms of pneumothorax and, however unlikely, to see immediate medical attention should they occur. Patient was also educated on signs and symptoms of infection and to seek medical attention should they occur. Patient verbalized understanding of these instructions and education.   [facility] Adult PT Treatment:                                                DATE: 05/20/22 Therapeutic Exercise: Wooden rocker standing, increased pain Stepping on and off airex, increased pain Standing gastroc stretch - min cues  Seated red band 4 way ankle x 10 eah Seated gastroc and soleus stretch with towel  Modalities: Iontophoresis patch with 1 ML dex to lateral left ankle     [facility] Adult PT Treatment:                                                DATE: 05/15/22 Therapeutic Exercise: Standing gastroc stretch Seated baps L2 taps to circles each way Seated blue band PF  Long sitting ankle eversion/blue left 5 sec x 10  Manual Therapy: STW to calf in prone Passive calf stretched Prone  Modalities: Ice pack lateral left lower leg and ankle  [facility] Adult PT Treatment:                                                DATE: 05/13/22 Therapeutic Exercise: Seated Baps Board L2 progressed to circles each way Ankle eversion red band 10 x 2  Seated heel  raises 10# on knee 10 x 2  Seated Gastroc stretch 30 sec x 3  Seated soleus stretch 30 sec x 3   Manual Therapy: STW to calf in prone Passive calf stretched Prone    [facility] Adult PT Treatment:                                                DATE: 05/09/22 Therapeutic Exercise: Seated hip flexion, alt x 10  Seated clam green band  Seated gastroc stretch with strap x 3 Seated soleus stretch with strap x 3  Supine hamstring stretch Passive ankle DF, INV, EV Supine ankle EV isometric Supine ankle circles Updated HEP   Modalities: Ice pack to left ankle and left knee Self Care: Benefits of  RICE to calm down high level of pain   [facility] Adult PT Treatment:                                                DATE: 05/01/22 Therapeutic Exercise: S/L hip abd x10, limited arnge, weakness Dced S/L clam GTB 2x10 Ankle DF/PF standing 2x10 Standing hip abd 2x10 L SL balance c hand A as needed, difficult Dced Tandem standing each L ankle eversion RTB 3x10 Updated HEP Self Care: Info for a ASO ankle bracefo support with walking                                                                                                                           [facility] Adult PT Treatment:                                                DATE: 04/24/22 Therapeutic Exercise: Developed, instructed in, and pt completed therex as noted in HEP Self Care: Cold pack for pain management, use of ASO ankle brace to minimize lateral ankle/peroneal strain       PATIENT EDUCATION:  Education details: Eval findings, POC, HEP, self care  Person educated: Patient Education method: Explanation, Demonstration, Tactile cues, Verbal cues, and Handouts Education comprehension: verbalized understanding, returned demonstration, verbal cues required, and tactile cues required   HOME EXERCISE PROGRAM: Access Code: [email or web address] URL: [email or web address] Date: 05/01/2022 Prepared by: Gar Ponto  Exercises - Active  Straight Leg Raise with Quad Set  - 1 x daily - 7 x weekly - 3 sets - 10 reps - 3 hold - Standing Hip Abduction with Counter Support  - 1 x daily - 7 x weekly - 3 sets - 10 reps - 3 hold - Standing Tandem Balance with Counter Support  - 1 x daily - 7 x weekly - 1 sets - 10 reps - 30-60 hold - Clamshell with Resistance  - 1 x daily - 7 x weekly - 3 sets - 10 reps - 3 hold - Seated Ankle Eversion with Resistance  - 1 x daily - 7 x weekly - 3 sets - 10 reps - 3 hold Added 05/09/22 - Seated Gastroc Stretch with Strap  - 1 x daily - 7 x weekly - 1 sets - 3 reps - 30 hold - Seated Soleus Stretch with Strap  - 1 x daily - 7 x weekly - 1 sets - 3 reps - 30 hold - Supine Ankle Circles  - 1 x daily - 7 x weekly - 1 sets - 3 reps - 30 hold   ASSESSMENT:   CLINICAL IMPRESSION: Iontophoresis completed the last PT session did not seem to decreased his L lateral leg  and ankle pain.PT was completed for [facility] for the L peroneal muscles f/b TPDN to both the peroneal longus and brevis. Muscle twitch responses were elicited. Pt then completed strengthening therex for activation of the peroneals.Additional, strengthening therex were completed to address L hip abd and ER weakness. Pt is still not able to complete L Hip abd is S/L through the full ROM. Pt tolerated PT today without adverse effects. Will assess pt's response to TPDN the next PT session.   OBJECTIVE IMPAIRMENTS: Abnormal gait, decreased activity tolerance, difficulty walking, decreased strength, obesity, and pain.    ACTIVITY LIMITATIONS: carrying, lifting, bending, sitting, standing, squatting, sleeping, stairs, and locomotion level   PARTICIPATION LIMITATIONS: meal prep, cleaning, and laundry   PERSONAL FACTORS: High BMI, Hx of R TKA are also affecting patient's functional outcome.    REHAB POTENTIAL: Good   CLINICAL DECISION MAKING: Stable/uncomplicated   EVALUATION COMPLEXITY: Low     GOALS:   SHORT TERM GOALS: Target date: 05/16/22 Pt will  be Ind in an initial HEP  Baseline: Initiated Goal status: MET   2.  Pt will voice understanding of measures to assist in pain reduction  Baseline: initaited Goal status: MET   LONG TERM GOALS: Target date: 06/13/22   Pt will be Ind in a final HEP to maintain achieved LOF  Baseline: initiated Goal status: INITIAL   2.  Increase L hip and ankle strength to 4/5 to improve gait and decrease L ankle pain Baseline: see flow sheets Goal status: INITIAL   3.  Pt will report a decrease in L ankle pain to 0-4/10 for improved function and QOL Baseline: 3-10/10 Goal status: INITIAL   4.  Improve 5xSTS by MCID of 5" and 2MWT by MCID of 70ft as indication of improved functional mobility and quality of gait with reduce antalgic/trendelenberg gait Baseline: TBA Goal status: INITIAL   5.  Pt's FOTO score will improved to the predicted value of 70% as indication of improved function  Baseline: 54% Goal status: INITIAL       PLAN:   PT FREQUENCY: 2x/week   PT DURATION: 6 weeks   PLANNED INTERVENTIONS: Therapeutic exercises, Therapeutic activity, Neuromuscular re-education, Balance training, Gait training, Patient/Family education, Self Care, Joint mobilization, Stair training, Aquatic Therapy, Dry Needling, Electrical stimulation, Cryotherapy, Moist heat, Taping, Vasopneumatic device, Ultrasound, Ionotophoresis 4mg /ml Dexamethasone, Manual therapy, and Re-evaluation   PLAN FOR NEXT SESSION:Review FOTO; assess response to HEP; progress therex as indicated; use of modalities, manual therapy; no TPDN (needle phobia), assess response to ionto.  Philamena Kramar MS, PT 05/22/22 3:35 PM

## 2022-05-22 ENCOUNTER — Ambulatory Visit: Payer: PPO

## 2022-05-22 DIAGNOSIS — M6281 Muscle weakness (generalized): Secondary | ICD-10-CM

## 2022-05-22 DIAGNOSIS — M25572 Pain in left ankle and joints of left foot: Secondary | ICD-10-CM | POA: Diagnosis not present

## 2022-05-22 DIAGNOSIS — R262 Difficulty in walking, not elsewhere classified: Secondary | ICD-10-CM

## 2022-05-22 NOTE — Patient Instructions (Signed)

## 2022-05-26 ENCOUNTER — Other Ambulatory Visit: Payer: Self-pay | Admitting: Internal Medicine

## 2022-05-26 NOTE — Telephone Encounter (Signed)
Done erx 

## 2022-05-27 ENCOUNTER — Ambulatory Visit: Payer: PPO | Attending: Family Medicine | Admitting: Physical Therapy

## 2022-05-27 ENCOUNTER — Encounter: Payer: Self-pay | Admitting: Physical Therapy

## 2022-05-27 DIAGNOSIS — M25572 Pain in left ankle and joints of left foot: Secondary | ICD-10-CM | POA: Insufficient documentation

## 2022-05-27 DIAGNOSIS — R262 Difficulty in walking, not elsewhere classified: Secondary | ICD-10-CM | POA: Diagnosis present

## 2022-05-27 DIAGNOSIS — M6281 Muscle weakness (generalized): Secondary | ICD-10-CM | POA: Diagnosis present

## 2022-05-27 NOTE — Therapy (Signed)
OUTPATIENT PHYSICAL THERAPY TREATMENT NOTE   Patient Name: Scott [name] MRN: [id number] DOB:16-Oct-1955, 67 y.o., male Today's Date: 05/27/2022  PCP: [name], MD  REFERRING PROVIDER: Gregor Hams, MD   END OF SESSION:   PT End of Session - 05/27/22 0720     Visit Number 8    Number of Visits 13    Date for PT Re-Evaluation 06/13/22    Authorization Type [employer]    PT Start Time 0719    PT Stop Time [id number]    PT Time Calculation (min) 39 min             Past Medical History:  Diagnosis Date   ALLERGIC RHINITIS 01/14/2007   Allergy    ANXIETY 09/28/2006   Asbestos exposure    Atrial fibrillation 09/28/2006   s/p afib ablation x 2   CHF (congestive heart failure)    CKD (chronic kidney disease), stage III    patient denies   COPD (chronic obstructive pulmonary disease)    Coronary artery disease [id number]   Diastolic dysfunction [id number]   Dyspnea    W/ EXERTION    Dysrhythmia    AFIB      First degree AV block 10/12/2018   Noted on EKG   Gallstones    Hyperglycemia    HYPERLIPIDEMIA 09/28/2006   HYPERTENSION 09/28/2006   HYPOTHYROIDISM, ACQUIRED NEC 09/28/2006   pt denies and doesn't know   Long term current use of anticoagulant 04/08/2010   Obese    OBSTRUCTIVE SLEEP APNEA 01/06/2008   NOT USING     Right knee DJD 07/09/2010   S/P CABG x 1 11/26/2011   Right internal mammary artery to right coronary artery   S/P Maze operation for atrial fibrillation 11/26/2011   complete biatrial lesion set with clipping of LA appendage   Sleep apnea    Past Surgical History:  Procedure Laterality Date   ANTERIOR CERVICAL DECOMP/DISCECTOMY FUSION N/A 06/20/2016   Procedure: Anterior Cervical Discectomy Fusion - Cervical five-Cervical six - Cervical six-Cervical seven - Cervicla seven- Thoracic one;  Surgeon: Earnie Larsson, MD;  Location: [facility];  Service: Neurosurgery;  Laterality: N/A;   CARDIOVERSION     X4    CORONARY ARTERY BYPASS GRAFT  11/26/2011    Procedure: CORONARY ARTERY BYPASS GRAFTING (CABG);  Surgeon: Rexene Alberts, MD;  Location: [facility];  Service: Open Heart Surgery;  Laterality: N/A;  coronary artery bypass on pump times one utilizing the right internal mammary artery, transesophageal echocardiogram    HAND SURGERY Left    KNEE ARTHROSCOPY Right 12/10/2021   Procedure: RIGH TKNEE ARTHROSCOPY WITH SCAR BAND EXCISION;  Surgeon: Melrose Nakayama, MD;  Location: [facility];  Service: Orthopedics;  Laterality: Right;   LEFT HEART CATHETERIZATION WITH CORONARY ANGIOGRAM N/A 11/24/2011   Procedure: LEFT HEART CATHETERIZATION WITH CORONARY ANGIOGRAM;  Surgeon: Peter M Martinique, MD;  Location[facility] C[facility];  Service: Cardiovascular;  Laterality: N/A;   LOOP RECORDER IMPLANT N/A 07/06/2012   Procedure: LOOP RECORDER IMPLANT;  Surgeon: Thompson Grayer, MD;  Location: [facility] CATH LAB;  Service: Cardiovascular;  Laterality: N/A;   MAZE  11/26/2011   Procedure: MAZE;  Surgeon: Rexene Alberts, MD;  Location: [facility];  Service: Open Heart Surgery;  Laterality: N/A;  Cryomaze    Radiofrequency Ablation for atrial fibrillation  12/30/07 and 06/05/09   afib ablation x 2 by [facility]   Radiofrequency ablation for atrial flutter  2005   CTI  TOTAL KNEE ARTHROPLASTY Right 04/05/2019   Procedure: RIGHT TOTAL KNEE ARTHROPLASTY;  Surgeon: Melrose Nakayama, MD;  Location: [facility];  Service: Orthopedics;  Laterality: Right;   Patient Active Problem List   Diagnosis Date Noted   Left ankle pain 04/07/2022   Left cataract 12/30/2021   Rash 11/14/2021   Axillary lymphadenopathy 11/14/2021   Low back pain 01/01/2021   Lower GI bleeding 01/01/2021   Pain due to total right knee replacement 01/09/2020   Primary osteoarthritis of right knee 04/05/2019   CKD (chronic kidney disease) stage 3, GFR 30-59 ml/min 03/14/2019   Degenerative joint disease of knee, right 09/27/2018   Dyspnea 09/06/2018   Right knee pain 09/06/2018   Radiculitis of left cervical region 03/05/2018   Plantar  fasciitis, left 08/19/2017   Chest pain 02/27/2017   Right ear pain 02/27/2017   Left groin pain 09/13/2016   Rib pain on left side 09/13/2016   Left leg pain 09/13/2016   Stenosis of cervical spine with myelopathy ([place]) 06/20/2016   Special screening for malignant neoplasms, colon 04/04/2016   Cough 01/11/2016   COPD exacerbation 12/22/2015   Rectal bleeding 08/14/2014   Intercostal muscle strain 08/07/2014   Chronic meniscal tear of knee 04/06/2014   Heel bone fracture 04/06/2014   Hyperglycemia 04/04/2014   Recurrent falls 04/04/2014   Bilateral knee pain 04/04/2014   Intractable left heel pain 04/04/2014   Insomnia 04/04/2014   Hematochezia 04/04/2014   Bladder neck obstruction 12/19/2013   Encounter for therapeutic drug monitoring 03/30/2013   Palpitations 07/06/2012   Shortness of breath 01/19/2012   Sick sinus syndrome 12/01/2011   S/P CABG x 1 11/26/2011   S/P Maze operation for atrial fibrillation 11/26/2011   CAD (coronary artery disease) 11/25/2011   Paroxysmal atrial fibrillation 11/24/2011   Chronic combined systolic (congestive) and diastolic (congestive) heart failure 11/24/2011   Gross hematuria 12/12/2010   Left knee pain 12/12/2010   Left knee DJD 07/09/2010   Encounter for well adult exam with abnormal findings 07/07/2010   Chronic anticoagulation 04/08/2010   COPD (chronic obstructive pulmonary disease) ([place]) 10/04/2008   OBSTRUCTIVE SLEEP APNEA 01/06/2008   ALLERGIC RHINITIS 01/14/2007   Hypothyroidism 09/28/2006   Hyperlipidemia 09/28/2006   Anxiety state 09/28/2006   Essential hypertension 09/28/2006    REFERRING DIAG: M25.572,G89.29 (ICD-10-CM) - Chronic pain of left ankle    THERAPY DIAG:  Pain in left ankle and joints of left foot  Difficulty in walking, not elsewhere classified  Muscle weakness (generalized)  Rationale for Evaluation and Treatment Rehabilitation  ONSET DATE: Approx 8 months   SUBJECTIVE:    SUBJECTIVE  STATEMENT: Pt reports his L ankle is improved since TPDN and did not start hurting until yesterday (4 days after). He also reports a fall yesterday forward onto wrist when he lost balance on an unlevel area in the sidewalk.   PAIN:  Are you having pain? Yes: NPRS scale: 5/10 Pain location: L lateral ankle  Pain description: Sharp Aggravating factors: Walking, certain positions Relieving factors: Tylenol, tramadol, rest, cold pack Pain range: 3-10/10  PERTINENT HISTORY: High BMI, Hx of R TKA   PRECAUTIONS: None   WEIGHT BEARING RESTRICTIONS: No   FALLS:  Has patient fallen in last 6 months? Yes. Number of falls 2 L Ankle giving way, or dogs pulling on him   LIVING ENVIRONMENT: Lives with: lives alone Lives in: House/apartment Able to access and be mobile in home   OCCUPATION: 1 day a week, stands all day  PLOF: Independent   PATIENT GOALS: Less pain and to walk better   NEXT MD VISIT: Not currently scheduled   OBJECTIVE: (objective measures completed at initial evaluation unless otherwise dated)   DIAGNOSTIC FINDINGS: 04/08/22 DG ankle FINDINGS: There is no evidence of fracture, dislocation, or joint effusion. There is no evidence of arthropathy or other focal bone abnormality. Soft tissues are unremarkable. There are posterior and plantar calcaneal spurs.   IMPRESSION: Calcaneal spurs.  No acute osseous abnormalities.   04/08/22/ [facility] Ultrasound of: Left ankle Mild joint effusion present at lateral ankle. Posterior tibialis tendons and peroneal tendons are normal-appearing without tear or severe tenosynovitis. Impression: Mild joint effusion   PATIENT SURVEYS:  FOTO: Perceived function   54%, predicted   70%    COGNITION: Overall cognitive status: Within functional limits for tasks assessed                         SENSATION: WFL   EDEMA:  None palpated or observed   MUSCLE LENGTH: Hamstrings: Right NT deg; Left NT deg Marcello Moores test:  Right NT deg; Left NT deg   POSTURE: rounded shoulders, forward head, decreased lumbar lordosis, and increased thoracic kyphosis   PALPATION: TTP along the peroneal muscles and tendons   LOWER EXTREMITY ROM: Grossly WNLs, assess further as needed Active ROM Right eval Left eval  Hip flexion      Hip extension      Hip abduction      Hip adduction      Hip internal rotation      Hip external rotation      Knee flexion      Knee extension      Ankle dorsiflexion      Ankle plantarflexion      Ankle inversion      Ankle eversion       (Blank rows = not tested)   LOWER EXTREMITY MMT:   MMT Right eval Left eval  Hip flexion 3 2+  Hip extension 3 2+  Hip abduction 3+ 2  Hip adduction      Hip internal rotation      Hip external rotation 4 3  Knee flexion 4+ 4+  Knee extension 4+ 4+  Ankle dorsiflexion   3 p  Ankle plantarflexion   2 p  Ankle inversion   2 p  Ankle eversion   2 p   (Blank rows = not tested)   LOWER EXTREMITY SPECIAL TESTS:  Ankle special tests: NT   FUNCTIONAL TESTS:  5 times sit to stand: : 05/15/22: 17.12 sec  2 minute walk test: TBA:    GAIT: Distance walked: 200' Assistive device utilized: None Level of assistance: Complete Independence Comments: Antalgic and Trendelenberg gait, bilaterally L>R     TODAY'S TREATMENT:   [facility] Adult PT Treatment:                                                DATE: 05/27/22 Therapeutic Exercise: Tandem stance - unable due to lateral leg pain SLS right 15 sec SLS left , <5 sec, pain.  Semi tandem - increased pain AIREX pad - rhomberg with head turns and nods Slant board stretch, tolerated < 30 sec Seated Heel raises  Seated gastroc  Seated solues stretch Seated ankle circles    [facility]  Adult PT Treatment:                                                DATE: 05/22/22 Therapeutic Exercise: Theraband strengthening for L ankle ever and PF YTB 3x10 S/L hip abd 3x10 S/L hip ER 3x10 SL stance L LE c hand  assist Manual Therapy: STM to the lateral L calf Skilled palpation to identify TrPs and taut muscle bands Trigger Point Dry Needling Treatment: Pre-treatment instruction: Patient instructed on dry needling rationale, procedures, and possible side effects including pain during treatment (achy,cramping feeling), bruising, drop of blood, lightheadedness, nausea, sweating. Patient Consent Given: Yes Education handout provided: Yes Muscles treated: L peroneal longus and brevis  Needle size and number: .25x50mm x 2 Electrical stimulation performed: No Parameters: N/A Treatment response/outcome: Twitch response elicited Post-treatment instructions: Patient instructed to expect possible mild to moderate muscle soreness later today and/or tomorrow. Patient instructed in methods to reduce muscle soreness and to continue prescribed HEP. If patient was dry needled over the lung field, patient was instructed on signs and symptoms of pneumothorax and, however unlikely, to see immediate medical attention should they occur. Patient was also educated on signs and symptoms of infection and to seek medical attention should they occur. Patient verbalized understanding of these instructions and education.   [facility] Adult PT Treatment:                                                DATE: 05/20/22 Therapeutic Exercise: Wooden rocker standing, increased pain Stepping on and off airex, increased pain Standing gastroc stretch - min cues  Seated red band 4 way ankle x 10 eah Seated gastroc and soleus stretch with towel  Modalities: Iontophoresis patch with 1 ML dex to lateral left ankle     [facility] Adult PT Treatment:                                                DATE: 05/15/22 Therapeutic Exercise: Standing gastroc stretch Seated baps L2 taps to circles each way Seated blue band PF  Long sitting ankle eversion/blue left 5 sec x 10  Manual Therapy: STW to calf in prone Passive calf stretched  Prone  Modalities: Ice pack lateral left lower leg and ankle  [facility] Adult PT Treatment:                                                DATE: 05/13/22 Therapeutic Exercise: Seated Baps Board L2 progressed to circles each way Ankle eversion red band 10 x 2  Seated heel raises 10# on knee 10 x 2  Seated Gastroc stretch 30 sec x 3  Seated soleus stretch 30 sec x 3   Manual Therapy: STW to calf in prone Passive calf stretched Prone    [facility] Adult PT Treatment:  DATE: 05/09/22 Therapeutic Exercise: Seated hip flexion, alt x 10  Seated clam green band  Seated gastroc stretch with strap x 3 Seated soleus stretch with strap x 3  Supine hamstring stretch Passive ankle DF, INV, EV Supine ankle EV isometric Supine ankle circles Updated HEP   Modalities: Ice pack to left ankle and left knee Self Care: Benefits of RICE to calm down high level of pain   [facility] Adult PT Treatment:                                                DATE: 05/01/22 Therapeutic Exercise: S/L hip abd x10, limited arnge, weakness Dced S/L clam GTB 2x10 Ankle DF/PF standing 2x10 Standing hip abd 2x10 L SL balance c hand A as needed, difficult Dced Tandem standing each L ankle eversion RTB 3x10 Updated HEP Self Care: Info for a ASO ankle bracefo support with walking                                                                                                                           [facility] Adult PT Treatment:                                                DATE: 04/24/22 Therapeutic Exercise: Developed, instructed in, and pt completed therex as noted in HEP Self Care: Cold pack for pain management, use of ASO ankle brace to minimize lateral ankle/peroneal strain       PATIENT EDUCATION:  Education details: Eval findings, POC, HEP, self care  Person educated: Patient Education method: Explanation, Demonstration, Tactile cues, Verbal cues, and Handouts Education  comprehension: verbalized understanding, returned demonstration, verbal cues required, and tactile cues required   HOME EXERCISE PROGRAM: Access Code: [email or web address] URL: [email or web address] Date: 05/01/2022 Prepared by: Gar Ponto  Exercises - Active Straight Leg Raise with Quad Set  - 1 x daily - 7 x weekly - 3 sets - 10 reps - 3 hold - Standing Hip Abduction with Counter Support  - 1 x daily - 7 x weekly - 3 sets - 10 reps - 3 hold - Standing Tandem Balance with Counter Support  - 1 x daily - 7 x weekly - 1 sets - 10 reps - 30-60 hold - Clamshell with Resistance  - 1 x daily - 7 x weekly - 3 sets - 10 reps - 3 hold - Seated Ankle Eversion with Resistance  - 1 x daily - 7 x weekly - 3 sets - 10 reps - 3 hold Added 05/09/22 - Seated Gastroc Stretch with Strap  - 1 x daily - 7 x weekly - 1 sets - 3 reps - 30 hold - Seated Soleus Stretch with  Strap  - 1 x daily - 7 x weekly - 1 sets - 3 reps - 30 hold - Supine Ankle Circles  - 1 x daily - 7 x weekly - 1 sets - 3 reps - 30 hold 05/15/22:   Gastroc Stretch on Wall  - 1 x daily - 7 x weekly - 1 sets - 3 reps - 30 hold 05/20/22 - Seated Ankle Inversion with Resistance  - 1 x daily - 7 x weekly - 2-3 sets - 10 reps - Seated Ankle Dorsiflexion with Resistance  - 1 x daily - 7 x weekly - 2-3 sets - 10 reps 05/27/22 - Heel Raises with Counter Support  - 1 x daily - 7 x weekly - 2-3 sets - 10 reps   ASSESSMENT:   CLINICAL IMPRESSION: Pt reports improvement for 4 days after TPDN. He did have a fall yesterday when he encountered an unlevel sidewalk. He reports swollen wrist, no other injuries.  Pt then completed strengthening therex of ankle and hip. In closed chain, he has increased pain with stretching and stability. Reprinted HEP and discussed due to patient inconsistency with subjective reports of compliance. Reviewed hold times, and reps/ sets, gave another copy. Did add bilat heel raises as patient reports he already does them.  Pt  tolerated PT today without adverse effects. Will benefit from continued HEP focus and review. No additional goals met.    OBJECTIVE IMPAIRMENTS: Abnormal gait, decreased activity tolerance, difficulty walking, decreased strength, obesity, and pain.    ACTIVITY LIMITATIONS: carrying, lifting, bending, sitting, standing, squatting, sleeping, stairs, and locomotion level   PARTICIPATION LIMITATIONS: meal prep, cleaning, and laundry   PERSONAL FACTORS: High BMI, Hx of R TKA are also affecting patient's functional outcome.    REHAB POTENTIAL: Good   CLINICAL DECISION MAKING: Stable/uncomplicated   EVALUATION COMPLEXITY: Low     GOALS:   SHORT TERM GOALS: Target date: 05/16/22 Pt will be Ind in an initial HEP  Baseline: Initiated Goal status: MET   2.  Pt will voice understanding of measures to assist in pain reduction  Baseline: initaited Goal status: MET   LONG TERM GOALS: Target date: 06/13/22   Pt will be Ind in a final HEP to maintain achieved LOF  Baseline: initiated Goal status: INITIAL   2.  Increase L hip and ankle strength to 4/5 to improve gait and decrease L ankle pain Baseline: see flow sheets Goal status: INITIAL   3.  Pt will report a decrease in L ankle pain to 0-4/10 for improved function and QOL Baseline: 3-10/10 Goal status: INITIAL   4.  Improve 5xSTS by MCID of 5" and 2MWT by MCID of 50ft as indication of improved functional mobility and quality of gait with reduce antalgic/trendelenberg gait Baseline: TBA Goal status: INITIAL   5.  Pt's FOTO score will improved to the predicted value of 70% as indication of improved function  Baseline: 54% Goal status: INITIAL       PLAN:   PT FREQUENCY: 2x/week   PT DURATION: 6 weeks   PLANNED INTERVENTIONS: Therapeutic exercises, Therapeutic activity, Neuromuscular re-education, Balance training, Gait training, Patient/Family education, Self Care, Joint mobilization, Stair training, Aquatic Therapy, Dry  Needling, Electrical stimulation, Cryotherapy, Moist heat, Taping, Vasopneumatic device, Ultrasound, Ionotophoresis 4mg /ml Dexamethasone, Manual therapy, and Re-evaluation   PLAN FOR NEXT SESSION: FOTO; assess response to HEP; progress therex as indicated; use of modalities, manual therapy; repeat TPDN  Hessie Diener, PTA 05/27/22 11:13 AM Phone: [phone] Fax: [phone]

## 2022-05-29 ENCOUNTER — Encounter: Payer: Self-pay | Admitting: Physical Therapy

## 2022-05-29 ENCOUNTER — Ambulatory Visit: Payer: PPO | Admitting: Physical Therapy

## 2022-05-29 DIAGNOSIS — M25572 Pain in left ankle and joints of left foot: Secondary | ICD-10-CM

## 2022-05-29 DIAGNOSIS — M6281 Muscle weakness (generalized): Secondary | ICD-10-CM

## 2022-05-29 DIAGNOSIS — R262 Difficulty in walking, not elsewhere classified: Secondary | ICD-10-CM

## 2022-05-29 NOTE — Therapy (Signed)
OUTPATIENT PHYSICAL THERAPY TREATMENT NOTE   Patient Name: Scott [name] MRN: [id number] DOB:1955/04/21, 67 y.o., male Today's Date: 05/29/2022  PCP: [name], MD  REFERRING PROVIDER: Gregor Hams, MD   END OF SESSION:   PT End of Session - 05/29/22 0717     Visit Number 9    Number of Visits 13    Date for PT Re-Evaluation 06/13/22    Authorization Type [employer]    Progress Note Due on Visit 10    PT Start Time 0717    PT Stop Time 0800    PT Time Calculation (min) 43 min             Past Medical History:  Diagnosis Date   ALLERGIC RHINITIS 01/14/2007   Allergy    ANXIETY 09/28/2006   Asbestos exposure    Atrial fibrillation 09/28/2006   s/p afib ablation x 2   CHF (congestive heart failure)    CKD (chronic kidney disease), stage III    patient denies   COPD (chronic obstructive pulmonary disease)    Coronary artery disease [id number]   Diastolic dysfunction [id number]   Dyspnea    W/ EXERTION    Dysrhythmia    AFIB      First degree AV block 10/12/2018   Noted on EKG   Gallstones    Hyperglycemia    HYPERLIPIDEMIA 09/28/2006   HYPERTENSION 09/28/2006   HYPOTHYROIDISM, ACQUIRED NEC 09/28/2006   pt denies and doesn't know   Long term current use of anticoagulant 04/08/2010   Obese    OBSTRUCTIVE SLEEP APNEA 01/06/2008   NOT USING     Right knee DJD 07/09/2010   S/P CABG x 1 11/26/2011   Right internal mammary artery to right coronary artery   S/P Maze operation for atrial fibrillation 11/26/2011   complete biatrial lesion set with clipping of LA appendage   Sleep apnea    Past Surgical History:  Procedure Laterality Date   ANTERIOR CERVICAL DECOMP/DISCECTOMY FUSION N/A 06/20/2016   Procedure: Anterior Cervical Discectomy Fusion - Cervical five-Cervical six - Cervical six-Cervical seven - Cervicla seven- Thoracic one;  Surgeon: Earnie Larsson, MD;  Location: [place];  Service: Neurosurgery;  Laterality: N/A;   CARDIOVERSION     X4    CORONARY  ARTERY BYPASS GRAFT  11/26/2011   Procedure: CORONARY ARTERY BYPASS GRAFTING (CABG);  Surgeon: Rexene Alberts, MD;  Location: [place];  Service: Open Heart Surgery;  Laterality: N/A;  coronary artery bypass on pump times one utilizing the right internal mammary artery, transesophageal echocardiogram    HAND SURGERY Left    KNEE ARTHROSCOPY Right 12/10/2021   Procedure: RIGH TKNEE ARTHROSCOPY WITH SCAR BAND EXCISION;  Surgeon: Melrose Nakayama, MD;  Location: [facility];  Service: Orthopedics;  Laterality: Right;   LEFT HEART CATHETERIZATION WITH CORONARY ANGIOGRAM N/A 11/24/2011   Procedure: LEFT HEART CATHETERIZATION WITH CORONARY ANGIOGRAM;  Surgeon: Peter M Martinique, MD;  Location: [facility] CATH LAB;  Service: Cardiovascular;  Laterality: N/A;   LOOP RECORDER IMPLANT N/A 07/06/2012   Procedure: LOOP RECORDER IMPLANT;  Surgeon: Thompson Grayer, MD;  Location: [facility] CATH LAB;  Service: Cardiovascular;  Laterality: N/A;   MAZE  11/26/2011   Procedure: MAZE;  Surgeon: Rexene Alberts, MD;  Location: [place];  Service: Open Heart Surgery;  Laterality: N/A;  Cryomaze    Radiofrequency Ablation for atrial fibrillation  12/30/07 and 06/05/09   afib ablation x 2 by [facility]   Radiofrequency ablation for  atrial flutter  2005   CTI   TOTAL KNEE ARTHROPLASTY Right 04/05/2019   Procedure: RIGHT TOTAL KNEE ARTHROPLASTY;  Surgeon: Melrose Nakayama, MD;  Location: [facility];  Service: Orthopedics;  Laterality: Right;   Patient Active Problem List   Diagnosis Date Noted   Left ankle pain 04/07/2022   Left cataract 12/30/2021   Rash 11/14/2021   Axillary lymphadenopathy 11/14/2021   Low back pain 01/01/2021   Lower GI bleeding 01/01/2021   Pain due to total right knee replacement 01/09/2020   Primary osteoarthritis of right knee 04/05/2019   CKD (chronic kidney disease) stage 3, GFR 30-59 ml/min 03/14/2019   Degenerative joint disease of knee, right 09/27/2018   Dyspnea 09/06/2018   Right knee pain 09/06/2018   Radiculitis of left  cervical region 03/05/2018   Plantar fasciitis, left 08/19/2017   Chest pain 02/27/2017   Right ear pain 02/27/2017   Left groin pain 09/13/2016   Rib pain on left side 09/13/2016   Left leg pain 09/13/2016   Stenosis of cervical spine with myelopathy ([place]) 06/20/2016   Special screening for malignant neoplasms, colon 04/04/2016   Cough 01/11/2016   COPD exacerbation 12/22/2015   Rectal bleeding 08/14/2014   Intercostal muscle strain 08/07/2014   Chronic meniscal tear of knee 04/06/2014   Heel bone fracture 04/06/2014   Hyperglycemia 04/04/2014   Recurrent falls 04/04/2014   Bilateral knee pain 04/04/2014   Intractable left heel pain 04/04/2014   Insomnia 04/04/2014   Hematochezia 04/04/2014   Bladder neck obstruction 12/19/2013   Encounter for therapeutic drug monitoring 03/30/2013   Palpitations 07/06/2012   Shortness of breath 01/19/2012   Sick sinus syndrome 12/01/2011   S/P CABG x 1 11/26/2011   S/P Maze operation for atrial fibrillation 11/26/2011   CAD (coronary artery disease) 11/25/2011   Paroxysmal atrial fibrillation 11/24/2011   Chronic combined systolic (congestive) and diastolic (congestive) heart failure 11/24/2011   Gross hematuria 12/12/2010   Left knee pain 12/12/2010   Left knee DJD 07/09/2010   Encounter for well adult exam with abnormal findings 07/07/2010   Chronic anticoagulation 04/08/2010   COPD (chronic obstructive pulmonary disease) ([place]) 10/04/2008   OBSTRUCTIVE SLEEP APNEA 01/06/2008   ALLERGIC RHINITIS 01/14/2007   Hypothyroidism 09/28/2006   Hyperlipidemia 09/28/2006   Anxiety state 09/28/2006   Essential hypertension 09/28/2006    REFERRING DIAG: M25.572,G89.29 (ICD-10-CM) - Chronic pain of left ankle    THERAPY DIAG:  Pain in left ankle and joints of left foot  Difficulty in walking, not elsewhere classified  Muscle weakness (generalized)  Rationale for Evaluation and Treatment Rehabilitation  ONSET DATE: Approx 8 months    SUBJECTIVE:    SUBJECTIVE STATEMENT: Pt reports he did well after last session with just a little soreness. He reports doing the exercises once a day. He reports overall he is having more good days and has increased his walking.      PAIN:  Are you having pain? Yes: NPRS scale: no pain just aggravating Pain location: L lateral ankle  Pain description: Sharp Aggravating factors: Walking, certain positions Relieving factors: Tylenol, tramadol, rest, cold pack Pain range: 3-10/10  PERTINENT HISTORY: High BMI, Hx of R TKA   PRECAUTIONS: None   WEIGHT BEARING RESTRICTIONS: No   FALLS:  Has patient fallen in last 6 months? Yes. Number of falls 2 L Ankle giving way, or dogs pulling on him   LIVING ENVIRONMENT: Lives with: lives alone Lives in: House/apartment Able to access and be mobile in home  OCCUPATION: 1 day a week, stands all day   PLOF: Independent   PATIENT GOALS: Less pain and to walk better   NEXT MD VISIT: Not currently scheduled   OBJECTIVE: (objective measures completed at initial evaluation unless otherwise dated)   DIAGNOSTIC FINDINGS: 04/08/22 DG ankle FINDINGS: There is no evidence of fracture, dislocation, or joint effusion. There is no evidence of arthropathy or other focal bone abnormality. Soft tissues are unremarkable. There are posterior and plantar calcaneal spurs.   IMPRESSION: Calcaneal spurs.  No acute osseous abnormalities.   04/08/22/ [facility] Ultrasound of: Left ankle Mild joint effusion present at lateral ankle. Posterior tibialis tendons and peroneal tendons are normal-appearing without tear or severe tenosynovitis. Impression: Mild joint effusion   PATIENT SURVEYS:  FOTO: Perceived function   54%, predicted   70%  05/20/22: 45%   COGNITION: Overall cognitive status: Within functional limits for tasks assessed                         SENSATION: WFL   EDEMA:  None palpated or observed   MUSCLE  LENGTH: Hamstrings: Right NT deg; Left NT deg Marcello Moores test: Right NT deg; Left NT deg   POSTURE: rounded shoulders, forward head, decreased lumbar lordosis, and increased thoracic kyphosis   PALPATION: TTP along the peroneal muscles and tendons   LOWER EXTREMITY ROM: Grossly WNLs, assess further as needed Active ROM Right eval Left eval Left 05/29/22  Hip flexion       Hip extension       Hip abduction       Hip adduction       Hip internal rotation       Hip external rotation       Knee flexion       Knee extension       Ankle dorsiflexion     3  Ankle plantarflexion     50  Ankle inversion       Ankle eversion        (Blank rows = not tested)   LOWER EXTREMITY MMT:   MMT Right eval Left eval Left 05/29/22  Hip flexion 3 2+   Hip extension 3 2+   Hip abduction 3+ 2   Hip adduction       Hip internal rotation       Hip external rotation 4 3   Knee flexion 4+ 4+   Knee extension 4+ 4+   Ankle dorsiflexion   3 p   Ankle plantarflexion   2 p   Ankle inversion   2 p 4-  Ankle eversion   2 p 4- p   (Blank rows = not tested)   LOWER EXTREMITY SPECIAL TESTS:  Ankle special tests: NT   FUNCTIONAL TESTS:  5 times sit to stand: : 05/15/22: 17.12 sec  2 minute walk test: 05/29/22: 351 feet   GAIT: Distance walked: 200' Assistive device utilized: None Level of assistance: Complete Independence Comments: Antalgic and Trendelenberg gait, bilaterally L>R     TODAY'S TREATMENT:   [facility] Adult PT Treatment:                                                DATE: 05/29/22 Therapeutic Exercise: Tandem 15 sec , pain 3/4 tandem 30 sec, pain Standing gastroc stretch  Standing soleus  stretch  Standing hip abduction 10 x 2 each  Side stepping  Slant board 30 sec x 2  AIREX pad rhomberg with trunk rotations Bridge 10 x 1  Hip abduction 10 x 1 , 10 x 1 with concentric assist S/L clam 10 x 2  Supine piriformis stretch left   [facility] Adult PT Treatment:                                                 DATE: 05/27/22 Therapeutic Exercise: Tandem stance - unable due to lateral leg pain SLS right 15 sec SLS left , <5 sec, pain.  Semi tandem - increased pain AIREX pad - rhomberg with head turns and nods Slant board stretch, tolerated < 30 sec Seated Heel raises  Seated gastroc  Seated solues stretch Seated ankle circles    [facility] Adult PT Treatment:                                                DATE: 05/22/22 Therapeutic Exercise: Theraband strengthening for L ankle ever and PF YTB 3x10 S/L hip abd 3x10 S/L hip ER 3x10 SL stance L LE c hand assist Manual Therapy: STM to the lateral L calf Skilled palpation to identify TrPs and taut muscle bands Trigger Point Dry Needling Treatment: Pre-treatment instruction: Patient instructed on dry needling rationale, procedures, and possible side effects including pain during treatment (achy,cramping feeling), bruising, drop of blood, lightheadedness, nausea, sweating. Patient Consent Given: Yes Education handout provided: Yes Muscles treated: L peroneal longus and brevis  Needle size and number: .25x48mm x 2 Electrical stimulation performed: No Parameters: N/A Treatment response/outcome: Twitch response elicited Post-treatment instructions: Patient instructed to expect possible mild to moderate muscle soreness later today and/or tomorrow. Patient instructed in methods to reduce muscle soreness and to continue prescribed HEP. If patient was dry needled over the lung field, patient was instructed on signs and symptoms of pneumothorax and, however unlikely, to see immediate medical attention should they occur. Patient was also educated on signs and symptoms of infection and to seek medical attention should they occur. Patient verbalized understanding of these instructions and education.           PATIENT EDUCATION:  Education details: Eval findings, POC, HEP, self care  Person educated: Patient Education method: Explanation,  Demonstration, Tactile cues, Verbal cues, and Handouts Education comprehension: verbalized understanding, returned demonstration, verbal cues required, and tactile cues required   HOME EXERCISE PROGRAM: Access Code: [email or web address] URL: [email or web address] Date: 05/01/2022 Prepared by: Gar Ponto  Exercises - Active Straight Leg Raise with Quad Set  - 1 x daily - 7 x weekly - 3 sets - 10 reps - 3 hold - Standing Hip Abduction with Counter Support  - 1 x daily - 7 x weekly - 3 sets - 10 reps - 3 hold - Standing Tandem Balance with Counter Support  - 1 x daily - 7 x weekly - 1 sets - 10 reps - 30-60 hold - Clamshell with Resistance  - 1 x daily - 7 x weekly - 3 sets - 10 reps - 3 hold - Seated Ankle Eversion with Resistance  - 1 x daily - 7 x weekly - 3 sets - 10 reps -  3 hold Added 05/09/22 - Seated Gastroc Stretch with Strap  - 1 x daily - 7 x weekly - 1 sets - 3 reps - 30 hold - Seated Soleus Stretch with Strap  - 1 x daily - 7 x weekly - 1 sets - 3 reps - 30 hold - Supine Ankle Circles  - 1 x daily - 7 x weekly - 1 sets - 3 reps - 30 hold 05/15/22:   Gastroc Stretch on Wall  - 1 x daily - 7 x weekly - 1 sets - 3 reps - 30 hold 05/20/22 - Seated Ankle Inversion with Resistance  - 1 x daily - 7 x weekly - 2-3 sets - 10 reps - Seated Ankle Dorsiflexion with Resistance  - 1 x daily - 7 x weekly - 2-3 sets - 10 reps 05/27/22 - Heel Raises with Counter Support  - 1 x daily - 7 x weekly - 2-3 sets - 10 reps   ASSESSMENT:   CLINICAL IMPRESSION: Pt reports no significant increase in pain or soreness after last session which incorporated more closed chain therex. Able to continue with closed chain strength and stability for bilateral LE. He has increased pain with prolonged tandem stance and gastroc stretches. Continued weakness (2+) for left hip abduction. Pt was assisted with concentric portion of side hip abduction and was able to hold 2-3 reps prior to fatigue. Pt tolerated PT today  without adverse effects. Will benefit from continued HEP focus and review. No additional goals met. He reports overall he is having more good days than bad days.    OBJECTIVE IMPAIRMENTS: Abnormal gait, decreased activity tolerance, difficulty walking, decreased strength, obesity, and pain.    ACTIVITY LIMITATIONS: carrying, lifting, bending, sitting, standing, squatting, sleeping, stairs, and locomotion level   PARTICIPATION LIMITATIONS: meal prep, cleaning, and laundry   PERSONAL FACTORS: High BMI, Hx of R TKA are also affecting patient's functional outcome.    REHAB POTENTIAL: Good   CLINICAL DECISION MAKING: Stable/uncomplicated   EVALUATION COMPLEXITY: Low     GOALS:   SHORT TERM GOALS: Target date: 05/16/22 Pt will be Ind in an initial HEP  Baseline: Initiated Goal status: MET   2.  Pt will voice understanding of measures to assist in pain reduction  Baseline: initaited Goal status: MET   LONG TERM GOALS: Target date: 06/13/22   Pt will be Ind in a final HEP to maintain achieved LOF  Baseline: initiated Goal status: INITIAL   2.  Increase L hip and ankle strength to 4/5 to improve gait and decrease L ankle pain Baseline: see flow sheets 05/29/22: improving ankle strength Goal status: ONGOING   3.  Pt will report a decrease in L ankle pain to 0-4/10 for improved function and QOL Baseline: 3-10/10 05/29/22: has more good days now Goal status: ONGOING   4.  Improve 5xSTS by MCID of 5" and 2MWT by MCID of 63ft as indication of improved functional mobility and quality of gait with reduce antalgic/trendelenberg gait Baseline: see functional tests Goal status: ONGOING   5.  Pt's FOTO score will improved to the predicted value of 70% as indication of improved function  Baseline: 54% 05/20/22: 45% Goal status: ONGOING       PLAN:   PT FREQUENCY: 2x/week   PT DURATION: 6 weeks   PLANNED INTERVENTIONS: Therapeutic exercises, Therapeutic activity, Neuromuscular  re-education, Balance training, Gait training, Patient/Family education, Self Care, Joint mobilization, Stair training, Aquatic Therapy, Dry Needling, Electrical stimulation, Cryotherapy, Moist heat, Taping,  Vasopneumatic device, Ultrasound, Ionotophoresis 4mg /ml Dexamethasone, Manual therapy, and Re-evaluation   PLAN FOR NEXT SESSION: FOTO; assess response to HEP; progress therex as indicated; use of modalities, manual therapy; repeat TPDN  Hessie Diener, PTA 05/29/22 8:48 AM Phone: [phone] Fax: [phone]

## 2022-06-03 ENCOUNTER — Ambulatory Visit: Payer: PPO | Admitting: Physical Therapy

## 2022-06-03 ENCOUNTER — Encounter: Payer: Self-pay | Admitting: Physical Therapy

## 2022-06-03 DIAGNOSIS — R262 Difficulty in walking, not elsewhere classified: Secondary | ICD-10-CM

## 2022-06-03 DIAGNOSIS — M25572 Pain in left ankle and joints of left foot: Secondary | ICD-10-CM | POA: Diagnosis not present

## 2022-06-03 DIAGNOSIS — M6281 Muscle weakness (generalized): Secondary | ICD-10-CM

## 2022-06-03 NOTE — Therapy (Signed)
OUTPATIENT PHYSICAL THERAPY TREATMENT NOTE   Patient Name: Scott [name] MRN: [id number] DOB:1955/12/19, 67 y.o., male Today's Date: 06/03/2022  PCP: Corwin Levins, MD  REFERRING PROVIDER: Rodolph Bong, MD   END OF SESSION:   PT End of Session - 06/03/22 0719     Visit Number 10    Number of Visits 13    Date for PT Re-Evaluation 06/13/22    Authorization Type [employer]    Progress Note Due on Visit 10    PT Start Time 0718    PT Stop Time 0800    PT Time Calculation (min) 42 min             Past Medical History:  Diagnosis Date   ALLERGIC RHINITIS 01/14/2007   Allergy    ANXIETY 09/28/2006   Asbestos exposure    Atrial fibrillation 09/28/2006   s/p afib ablation x 2   CHF (congestive heart failure)    CKD (chronic kidney disease), stage III    patient denies   COPD (chronic obstructive pulmonary disease)    Coronary artery disease 11/24/2011   Diastolic dysfunction 09/28/2006   Dyspnea    W/ EXERTION    Dysrhythmia    AFIB      First degree AV block 10/12/2018   Noted on EKG   Gallstones    Hyperglycemia    HYPERLIPIDEMIA 09/28/2006   HYPERTENSION 09/28/2006   HYPOTHYROIDISM, ACQUIRED NEC 09/28/2006   pt denies and doesn't know   Long term current use of anticoagulant 04/08/2010   Obese    OBSTRUCTIVE SLEEP APNEA 01/06/2008   NOT USING     Right knee DJD 07/09/2010   S/P CABG x 1 11/26/2011   Right internal mammary artery to right coronary artery   S/P Maze operation for atrial fibrillation 11/26/2011   complete biatrial lesion set with clipping of LA appendage   Sleep apnea    Past Surgical History:  Procedure Laterality Date   ANTERIOR CERVICAL DECOMP/DISCECTOMY FUSION N/A 06/20/2016   Procedure: Anterior Cervical Discectomy Fusion - Cervical five-Cervical six - Cervical six-Cervical seven - Cervicla seven- Thoracic one;  Surgeon: Julio Sicks, MD;  Location: [facility] OR;  Service: Neurosurgery;  Laterality: N/A;   CARDIOVERSION     X4    CORONARY  ARTERY BYPASS GRAFT  11/26/2011   Procedure: CORONARY ARTERY BYPASS GRAFTING (CABG);  Surgeon: Purcell Nails, MD;  Location: [facility] OR;  Service: Open Heart Surgery;  Laterality: N/A;  coronary artery bypass on pump times one utilizing the right internal mammary artery, transesophageal echocardiogram    HAND SURGERY Left    KNEE ARTHROSCOPY Right 12/10/2021   Procedure: RIGH TKNEE ARTHROSCOPY WITH SCAR BAND EXCISION;  Surgeon: Marcene Corning, MD;  Location: [facility] ORS;  Service: Orthopedics;  Laterality: Right;   LEFT HEART CATHETERIZATION WITH CORONARY ANGIOGRAM N/A 11/24/2011   Procedure: LEFT HEART CATHETERIZATION WITH CORONARY ANGIOGRAM;  Surgeon: Peter M Swaziland, MD;  Location: [facility] CATH LAB;  Service: Cardiovascular;  Laterality: N/A;   LOOP RECORDER IMPLANT N/A 07/06/2012   Procedure: LOOP RECORDER IMPLANT;  Surgeon: [name], MD;  Location: [facility] CATH LAB;  Service: Cardiovascular;  Laterality: N/A;   MAZE  11/26/2011   Procedure: MAZE;  Surgeon: Purcell Nails, MD;  Location: [facility] OR;  Service: Open Heart Surgery;  Laterality: N/A;  Cryomaze    Radiofrequency Ablation for atrial fibrillation  12/30/07 and 06/05/09   afib ablation x 2 by [facility]   Radiofrequency ablation for  atrial flutter  2005   CTI   TOTAL KNEE ARTHROPLASTY Right 04/05/2019   Procedure: RIGHT TOTAL KNEE ARTHROPLASTY;  Surgeon: Marcene Corning, MD;  Location: [facility] ORS;  Service: Orthopedics;  Laterality: Right;   Patient Active Problem List   Diagnosis Date Noted   Left ankle pain 04/07/2022   Left cataract 12/30/2021   Rash 11/14/2021   Axillary lymphadenopathy 11/14/2021   Low back pain 01/01/2021   Lower GI bleeding 01/01/2021   Pain due to total right knee replacement 01/09/2020   Primary osteoarthritis of right knee 04/05/2019   CKD (chronic kidney disease) stage 3, GFR 30-59 ml/min 03/14/2019   Degenerative joint disease of knee, right 09/27/2018   Dyspnea 09/06/2018   Right knee pain 09/06/2018   Radiculitis of left  cervical region 03/05/2018   Plantar fasciitis, left 08/19/2017   Chest pain 02/27/2017   Right ear pain 02/27/2017   Left groin pain 09/13/2016   Rib pain on left side 09/13/2016   Left leg pain 09/13/2016   Stenosis of cervical spine with myelopathy (HCC) 06/20/2016   Special screening for malignant neoplasms, colon 04/04/2016   Cough 01/11/2016   COPD exacerbation 12/22/2015   Rectal bleeding 08/14/2014   Intercostal muscle strain 08/07/2014   Chronic meniscal tear of knee 04/06/2014   Heel bone fracture 04/06/2014   Hyperglycemia 04/04/2014   Recurrent falls 04/04/2014   Bilateral knee pain 04/04/2014   Intractable left heel pain 04/04/2014   Insomnia 04/04/2014   Hematochezia 04/04/2014   Bladder neck obstruction 12/19/2013   Encounter for therapeutic drug monitoring 03/30/2013   Palpitations 07/06/2012   Shortness of breath 01/19/2012   Sick sinus syndrome 12/01/2011   S/P CABG x 1 11/26/2011   S/P Maze operation for atrial fibrillation 11/26/2011   CAD (coronary artery disease) 11/25/2011   Paroxysmal atrial fibrillation 11/24/2011   Chronic combined systolic (congestive) and diastolic (congestive) heart failure 11/24/2011   Gross hematuria 12/12/2010   Left knee pain 12/12/2010   Left knee DJD 07/09/2010   Encounter for well adult exam with abnormal findings 07/07/2010   Chronic anticoagulation 04/08/2010   COPD (chronic obstructive pulmonary disease) (HCC) 10/04/2008   OBSTRUCTIVE SLEEP APNEA 01/06/2008   ALLERGIC RHINITIS 01/14/2007   Hypothyroidism 09/28/2006   Hyperlipidemia 09/28/2006   Anxiety state 09/28/2006   Essential hypertension 09/28/2006    REFERRING DIAG: M25.572,G89.29 (ICD-10-CM) - Chronic pain of left ankle    THERAPY DIAG:  Pain in left ankle and joints of left foot  Muscle weakness (generalized)  Difficulty in walking, not elsewhere classified  Rationale for Evaluation and Treatment Rehabilitation  ONSET DATE: Approx 8 months    SUBJECTIVE:    SUBJECTIVE STATEMENT: Pt reports he feels improvement. Pain is still there every day but much better. He has not been woken by pain in a week. He increased walking distance from 1 mile to 2 miles.   PAIN:  Are you having pain? Yes: NPRS scale: 4/10 Pain location: L lateral ankle  Pain description: Sharp Aggravating factors: Walking, certain positions Relieving factors: Tylenol, tramadol, rest, cold pack Pain range: 3-10/10  PERTINENT HISTORY: High BMI, Hx of R TKA   PRECAUTIONS: None   WEIGHT BEARING RESTRICTIONS: No   FALLS:  Has patient fallen in last 6 months? Yes. Number of falls 2 L Ankle giving way, or dogs pulling on him   LIVING ENVIRONMENT: Lives with: lives alone Lives in: House/apartment Able to access and be mobile in home   OCCUPATION: 1 day a week,  stands all day   PLOF: Independent   PATIENT GOALS: Less pain and to walk better   NEXT MD VISIT: Not currently scheduled   OBJECTIVE: (objective measures completed at initial evaluation unless otherwise dated)   DIAGNOSTIC FINDINGS: 04/08/22 DG ankle FINDINGS: There is no evidence of fracture, dislocation, or joint effusion. There is no evidence of arthropathy or other focal bone abnormality. Soft tissues are unremarkable. There are posterior and plantar calcaneal spurs.   IMPRESSION: Calcaneal spurs.  No acute osseous abnormalities.   04/08/22/ [facility] Ultrasound of: Left ankle Mild joint effusion present at lateral ankle. Posterior tibialis tendons and peroneal tendons are normal-appearing without tear or severe tenosynovitis. Impression: Mild joint effusion   PATIENT SURVEYS:  FOTO: Perceived function   54%, predicted   70%  05/20/22: 45%   COGNITION: Overall cognitive status: Within functional limits for tasks assessed                         SENSATION: WFL   EDEMA:  None palpated or observed   MUSCLE LENGTH: Hamstrings: Right NT deg; Left NT deg Maisie Fus  test: Right NT deg; Left NT deg   POSTURE: rounded shoulders, forward head, decreased lumbar lordosis, and increased thoracic kyphosis   PALPATION: TTP along the peroneal muscles and tendons   LOWER EXTREMITY ROM: Grossly WNLs, assess further as needed Active ROM Right eval Left eval Left 05/29/22  Hip flexion       Hip extension       Hip abduction       Hip adduction       Hip internal rotation       Hip external rotation       Knee flexion       Knee extension       Ankle dorsiflexion     3  Ankle plantarflexion     50  Ankle inversion       Ankle eversion        (Blank rows = not tested)   LOWER EXTREMITY MMT:   MMT Right eval Left eval Left 05/29/22  Hip flexion 3 2+   Hip extension 3 2+   Hip abduction 3+ 2   Hip adduction       Hip internal rotation       Hip external rotation 4 3   Knee flexion 4+ 4+   Knee extension 4+ 4+   Ankle dorsiflexion   3 p   Ankle plantarflexion   2 p   Ankle inversion   2 p 4-  Ankle eversion   2 p 4- p   (Blank rows = not tested)   LOWER EXTREMITY SPECIAL TESTS:  Ankle special tests: NT   FUNCTIONAL TESTS:  5 times sit to stand: : 05/15/22: 17.12 sec  2 minute walk test: 05/29/22: 351 feet SLS 3-4 sec left 06/03/22 SLS 11 sec right 06/03/22   GAIT: Distance walked: 200' Assistive device utilized: None Level of assistance: Complete Independence Comments: Antalgic and Trendelenberg gait, bilaterally L>R     TODAY'S TREATMENT:   [facility] Adult PT Treatment:                                                DATE: 06/03/22 Therapeutic Exercise: Tandem 15 sec , pain 3/4 tandem 30 sec, pain Standing  hip abduction 10 x 2 each , 2# (lateral left hip pain). AIREX pad rhomberg with trunk rotations Bilat heel raises Rocker board, wooden with UE 1 minute Gastroc stretch bilateral   Manual Therapy: IASTM along lateral lower leg following TPDN Trigger Point Dry Needling Treatment: Performed by Joellyn RuedAllen Ralls, PT Pre-treatment instruction:  Patient instructed on dry needling rationale, procedures, and possible side effects including pain during treatment (achy,cramping feeling), bruising, drop of blood, lightheadedness, nausea, sweating. Patient Consent Given: Yes Education handout provided: Yes Muscles treated: L peroneal longus and brevis  Needle size and number: .25x5430mm x 2 Electrical stimulation performed: No Parameters: N/A Treatment response/outcome: Twitch response elicited Post-treatment instructions: Patient instructed to expect possible mild to moderate muscle soreness later today and/or tomorrow. Patient instructed in methods to reduce muscle soreness and to continue prescribed HEP. If patient was dry needled over the lung field, patient was instructed on signs and symptoms of pneumothorax and, however unlikely, to see immediate medical attention should they occur. Patient was also educated on signs and symptoms of infection and to seek medical attention should they occur. Patient verbalized understanding of these instructions and education.   [facility]                                                DATE: 05/29/22 Therapeutic Exercise: Tandem 15 sec , pain 3/4 tandem 30 sec, pain Standing gastroc stretch  Standing soleus stretch  Standing hip abduction 10 x 2 each  Side stepping  Slant board 30 sec x 2  AIREX pad rhomberg with trunk rotations Bridge 10 x 1  Hip abduction 10 x 1 , 10 x 1 with concentric assist S/L clam 10 x 2  Supine piriformis stretch left   [facility] Adult PT Treatment:                                                DATE: 05/27/22 Therapeutic Exercise: Tandem stance - unable due to lateral leg pain SLS right 15 sec SLS left , <5 sec, pain.  Semi tandem - increased pain AIREX pad - rhomberg with head turns and nods Slant board stretch, tolerated < 30 sec Seated Heel raises  Seated gastroc  Seated solues stretch Seated ankle circles    [facility] Adult PT Treatment:                                                 DATE: 05/22/22 Therapeutic Exercise: Theraband strengthening for L ankle ever and PF YTB 3x10 S/L hip abd 3x10 S/L hip ER 3x10 SL stance L LE c hand assist Manual Therapy: STM to the lateral L calf Skilled palpation to identify TrPs and taut muscle bands Trigger Point Dry Needling Treatment: Pre-treatment instruction: Patient instructed on dry needling rationale, procedures, and possible side effects including pain during treatment (achy,cramping feeling), bruising, drop of blood, lightheadedness, nausea, sweating. Patient Consent Given: Yes Education handout provided: Yes Muscles treated: L peroneal longus and brevis  Needle size and number: .25x8730mm x 2 Electrical stimulation performed: No Parameters: N/A Treatment response/outcome: Twitch response elicited  Post-treatment instructions: Patient instructed to expect possible mild to moderate muscle soreness later today and/or tomorrow. Patient instructed in methods to reduce muscle soreness and to continue prescribed HEP. If patient was dry needled over the lung field, patient was instructed on signs and symptoms of pneumothorax and, however unlikely, to see immediate medical attention should they occur. Patient was also educated on signs and symptoms of infection and to seek medical attention should they occur. Patient verbalized understanding of these instructions and education.           PATIENT EDUCATION:  Education details: Eval findings, POC, HEP, self care  Person educated: Patient Education method: Explanation, Demonstration, Tactile cues, Verbal cues, and Handouts Education comprehension: verbalized understanding, returned demonstration, verbal cues required, and tactile cues required   HOME EXERCISE PROGRAM: Access Code: [email or web address] URL: [email or web address] Date: 05/01/2022 Prepared by: Joellyn Rued  Exercises - Active Straight Leg Raise with Quad Set  - 1 x daily - 7 x weekly -  3 sets - 10 reps - 3 hold - Standing Hip Abduction with Counter Support  - 1 x daily - 7 x weekly - 3 sets - 10 reps - 3 hold - Standing Tandem Balance with Counter Support  - 1 x daily - 7 x weekly - 1 sets - 10 reps - 30-60 hold - Clamshell with Resistance  - 1 x daily - 7 x weekly - 3 sets - 10 reps - 3 hold - Seated Ankle Eversion with Resistance  - 1 x daily - 7 x weekly - 3 sets - 10 reps - 3 hold Added 05/09/22 - Seated Gastroc Stretch with Strap  - 1 x daily - 7 x weekly - 1 sets - 3 reps - 30 hold - Seated Soleus Stretch with Strap  - 1 x daily - 7 x weekly - 1 sets - 3 reps - 30 hold - Supine Ankle Circles  - 1 x daily - 7 x weekly - 1 sets - 3 reps - 30 hold 05/15/22:   Gastroc Stretch on Wall  - 1 x daily - 7 x weekly - 1 sets - 3 reps - 30 hold 05/20/22 - Seated Ankle Inversion with Resistance  - 1 x daily - 7 x weekly - 2-3 sets - 10 reps - Seated Ankle Dorsiflexion with Resistance  - 1 x daily - 7 x weekly - 2-3 sets - 10 reps 05/27/22 - Heel Raises with Counter Support  - 1 x daily - 7 x weekly - 2-3 sets - 10 reps   ASSESSMENT:   CLINICAL IMPRESSION: Pt reports improvement. Able to walk dogs 2 miles instead of one. He continues to have weakness and increased pain at lateral lower leg with closed chain balance exercises. Joellyn Rued, PT available for TPDN to peroneals which has been helpful at previous visits. Will continue to progress as tolerated.  Will benefit from continued HEP focus and review. No additional goals met. He reports overall he is having more good days than bad days.    OBJECTIVE IMPAIRMENTS: Abnormal gait, decreased activity tolerance, difficulty walking, decreased strength, obesity, and pain.    ACTIVITY LIMITATIONS: carrying, lifting, bending, sitting, standing, squatting, sleeping, stairs, and locomotion level   PARTICIPATION LIMITATIONS: meal prep, cleaning, and laundry   PERSONAL FACTORS: High BMI, Hx of R TKA are also affecting patient's functional  outcome.    REHAB POTENTIAL: Good   CLINICAL DECISION MAKING: Stable/uncomplicated   EVALUATION COMPLEXITY: Low     GOALS:  SHORT TERM GOALS: Target date: 05/16/22 Pt will be Ind in an initial HEP  Baseline: Initiated Goal status: MET   2.  Pt will voice understanding of measures to assist in pain reduction  Baseline: initaited Goal status: MET   LONG TERM GOALS: Target date: 06/13/22   Pt will be Ind in a final HEP to maintain achieved LOF  Baseline: initiated Goal status: INITIAL   2.  Increase L hip and ankle strength to 4/5 to improve gait and decrease L ankle pain Baseline: see flow sheets 05/29/22: improving ankle strength Goal status: ONGOING   3.  Pt will report a decrease in L ankle pain to 0-4/10 for improved function and QOL Baseline: 3-10/10 05/29/22: has more good days now Goal status: ONGOING   4.  Improve 5xSTS by MCID of 5" and by MCID of 42ft as indication of improved functional mobility and quality of gait with reduce antalgic/trendelenberg gait Baseline: see functional tests Goal status: ONGOING   5.  Pt's FOTO score will improved to the predicted value of 70% as indication of improved function  Baseline: 54% 05/20/22: 45% Goal status: ONGOING       PLAN:   PT FREQUENCY: 2x/week   PT DURATION: 6 weeks   PLANNED INTERVENTIONS: Therapeutic exercises, Therapeutic activity, Neuromuscular re-education, Balance training, Gait training, Patient/Family education, Self Care, Joint mobilization, Stair training, Aquatic Therapy, Dry Needling, Electrical stimulation, Cryotherapy, Moist heat, Taping, Vasopneumatic device, Ultrasound, Ionotophoresis 4mg /ml Dexamethasone, Manual therapy, and Re-evaluation   PLAN FOR NEXT SESSION: assess response to HEP; progress therex as indicated; use of modalities, manual therapy; repeat TPDN  Jannette Spanner, PTA 06/03/22 8:10 AM Phone: [phone] Fax: [phone]

## 2022-06-05 ENCOUNTER — Encounter: Payer: Self-pay | Admitting: Physical Therapy

## 2022-06-05 ENCOUNTER — Ambulatory Visit: Payer: PPO | Admitting: Physical Therapy

## 2022-06-05 DIAGNOSIS — M6281 Muscle weakness (generalized): Secondary | ICD-10-CM

## 2022-06-05 DIAGNOSIS — M25572 Pain in left ankle and joints of left foot: Secondary | ICD-10-CM

## 2022-06-05 DIAGNOSIS — R262 Difficulty in walking, not elsewhere classified: Secondary | ICD-10-CM

## 2022-06-05 NOTE — Therapy (Signed)
OUTPATIENT PHYSICAL THERAPY TREATMENT NOTE   Patient Name: Scott [name] MRN: [id number] DOB:[birth year], 67 y.o., male Today's Date: 06/05/2022  PCP: Corwin Levins, MD  REFERRING PROVIDER: Rodolph Bong, MD   END OF SESSION:   PT End of Session - 06/05/22 0746     Visit Number 11    Number of Visits 13    Date for PT Re-Evaluation 06/13/22    Authorization Type [employer]    PT Start Time 0745   pt late   PT Stop Time 0820    PT Time Calculation (min) 35 min             Past Medical History:  Diagnosis Date   ALLERGIC RHINITIS 01/14/2007   Allergy    ANXIETY 09/28/2006   Asbestos exposure    Atrial fibrillation 09/28/2006   s/p afib ablation x 2   CHF (congestive heart failure)    CKD (chronic kidney disease), stage III    patient denies   COPD (chronic obstructive pulmonary disease)    Coronary artery disease 11/24/2011   Diastolic dysfunction 09/28/2006   Dyspnea    W/ EXERTION    Dysrhythmia    AFIB      First degree AV block 10/12/2018   Noted on EKG   Gallstones    Hyperglycemia    HYPERLIPIDEMIA 09/28/2006   HYPERTENSION 09/28/2006   HYPOTHYROIDISM, ACQUIRED NEC 09/28/2006   pt denies and doesn't know   Long term current use of anticoagulant 04/08/2010   Obese    OBSTRUCTIVE SLEEP APNEA 01/06/2008   NOT USING     Right knee DJD 07/09/2010   S/P CABG x 1 11/26/2011   Right internal mammary artery to right coronary artery   S/P Maze operation for atrial fibrillation 11/26/2011   complete biatrial lesion set with clipping of LA appendage   Sleep apnea    Past Surgical History:  Procedure Laterality Date   ANTERIOR CERVICAL DECOMP/DISCECTOMY FUSION N/A 06/20/2016   Procedure: Anterior Cervical Discectomy Fusion - Cervical five-Cervical six - Cervical six-Cervical seven - Cervicla seven- Thoracic one;  Surgeon: Julio Sicks, MD;  Location: [facility] OR;  Service: Neurosurgery;  Laterality: N/A;   CARDIOVERSION     X4    CORONARY ARTERY BYPASS GRAFT   11/26/2011   Procedure: CORONARY ARTERY BYPASS GRAFTING (CABG);  Surgeon: Purcell Nails, MD;  Location: [facility] OR;  Service: Open Heart Surgery;  Laterality: N/A;  coronary artery bypass on pump times one utilizing the right internal mammary artery, transesophageal echocardiogram    HAND SURGERY Left    KNEE ARTHROSCOPY Right 12/10/2021   Procedure: RIGH TKNEE ARTHROSCOPY WITH SCAR BAND EXCISION;  Surgeon: Marcene Corning, MD;  Location: [facility] ORS;  Service: Orthopedics;  Laterality: Right;   LEFT HEART CATHETERIZATION WITH CORONARY ANGIOGRAM N/A 11/24/2011   Procedure: LEFT HEART CATHETERIZATION WITH CORONARY ANGIOGRAM;  Surgeon: Peter M Swaziland, MD;  Location: [facility] CATH LAB;  Service: Cardiovascular;  Laterality: N/A;   LOOP RECORDER IMPLANT N/A 07/06/2012   Procedure: LOOP RECORDER IMPLANT;  Surgeon: [name], MD;  Location: [facility] CATH LAB;  Service: Cardiovascular;  Laterality: N/A;   MAZE  11/26/2011   Procedure: MAZE;  Surgeon: Purcell Nails, MD;  Location: [facility] OR;  Service: Open Heart Surgery;  Laterality: N/A;  Cryomaze    Radiofrequency Ablation for atrial fibrillation  12/30/07 and 06/05/09   afib ablation x 2 by [facility]   Radiofrequency ablation for atrial flutter  2005  [facility]   TOTAL KNEE ARTHROPLASTY Right 04/05/2019   Procedure: RIGHT TOTAL KNEE ARTHROPLASTY;  Surgeon: Marcene Corning, MD;  Location: [facility] ORS;  Service: Orthopedics;  Laterality: Right;   Patient Active Problem List   Diagnosis Date Noted   Left ankle pain 04/07/2022   Left cataract 12/30/2021   Rash 11/14/2021   Axillary lymphadenopathy 11/14/2021   Low back pain 01/01/2021   Lower GI bleeding 01/01/2021   Pain due to total right knee replacement 01/09/2020   Primary osteoarthritis of right knee 04/05/2019   CKD (chronic kidney disease) stage 3, GFR 30-59 ml/min 03/14/2019   Degenerative joint disease of knee, right 09/27/2018   Dyspnea 09/06/2018   Right knee pain 09/06/2018   Radiculitis of left cervical region 03/05/2018    Plantar fasciitis, left 08/19/2017   Chest pain 02/27/2017   Right ear pain 02/27/2017   Left groin pain 09/13/2016   Rib pain on left side 09/13/2016   Left leg pain 09/13/2016   Stenosis of cervical spine with myelopathy (HCC) 06/20/2016   Special screening for malignant neoplasms, colon 04/04/2016   Cough 01/11/2016   COPD exacerbation 12/22/2015   Rectal bleeding 08/14/2014   Intercostal muscle strain 08/07/2014   Chronic meniscal tear of knee 04/06/2014   Heel bone fracture 04/06/2014   Hyperglycemia 04/04/2014   Recurrent falls 04/04/2014   Bilateral knee pain 04/04/2014   Intractable left heel pain 04/04/2014   Insomnia 04/04/2014   Hematochezia 04/04/2014   Bladder neck obstruction 12/19/2013   Encounter for therapeutic drug monitoring 03/30/2013   Palpitations 07/06/2012   Shortness of breath 01/19/2012   Sick sinus syndrome 12/01/2011   S/P CABG x 1 11/26/2011   S/P Maze operation for atrial fibrillation 11/26/2011   CAD (coronary artery disease) 11/25/2011   Paroxysmal atrial fibrillation 11/24/2011   Chronic combined systolic (congestive) and diastolic (congestive) heart failure 11/24/2011   Gross hematuria 12/12/2010   Left knee pain 12/12/2010   Left knee DJD 07/09/2010   Encounter for well adult exam with abnormal findings 07/07/2010   Chronic anticoagulation 04/08/2010   COPD (chronic obstructive pulmonary disease) (HCC) 10/04/2008   OBSTRUCTIVE SLEEP APNEA 01/06/2008   ALLERGIC RHINITIS 01/14/2007   Hypothyroidism 09/28/2006   Hyperlipidemia 09/28/2006   Anxiety state 09/28/2006   Essential hypertension 09/28/2006    REFERRING DIAG: M25.572,G89.29 (ICD-10-CM) - Chronic pain of left ankle    THERAPY DIAG:  Pain in left ankle and joints of left foot  Muscle weakness (generalized)  Difficulty in walking, not elsewhere classified  Rationale for Evaluation and Treatment Rehabilitation  ONSET DATE: Approx 8 months   SUBJECTIVE:    SUBJECTIVE  STATEMENT: Pt reports increased pain. Miserable pain, unable to sleep last night.    PAIN:  Are you having pain? Yes: NPRS scale: 10/10 Pain location: L lateral ankle  Pain description: Sharp Aggravating factors: Walking, certain positions Relieving factors: Tylenol, tramadol, rest, cold pack Pain range: 3-10/10  PERTINENT HISTORY: High BMI, Hx of R TKA   PRECAUTIONS: None   WEIGHT BEARING RESTRICTIONS: No   FALLS:  Has patient fallen in last 6 months? Yes. Number of falls 2 L Ankle giving way, or dogs pulling on him   LIVING ENVIRONMENT: Lives with: lives alone Lives in: House/apartment Able to access and be mobile in home   OCCUPATION: 1 day a week, stands all day   PLOF: Independent   PATIENT GOALS: Less pain and to walk better   NEXT MD VISIT: Not currently scheduled   OBJECTIVE: (  objective measures completed at initial evaluation unless otherwise dated)   DIAGNOSTIC FINDINGS: 04/08/22 DG ankle FINDINGS: There is no evidence of fracture, dislocation, or joint effusion. There is no evidence of arthropathy or other focal bone abnormality. Soft tissues are unremarkable. There are posterior and plantar calcaneal spurs.   IMPRESSION: Calcaneal spurs.  No acute osseous abnormalities.   04/08/22/ [facility] Ultrasound of: Left ankle Mild joint effusion present at lateral ankle. Posterior tibialis tendons and peroneal tendons are normal-appearing without tear or severe tenosynovitis. Impression: Mild joint effusion   PATIENT SURVEYS:  FOTO: Perceived function   54%, predicted   70%  05/20/22: 45%   COGNITION: Overall cognitive status: Within functional limits for tasks assessed                         SENSATION: WFL   EDEMA:  None palpated or observed   MUSCLE LENGTH: Hamstrings: Right NT deg; Left NT deg Maisie Fus test: Right NT deg; Left NT deg   POSTURE: rounded shoulders, forward head, decreased lumbar lordosis, and increased thoracic  kyphosis   PALPATION: TTP along the peroneal muscles and tendons   LOWER EXTREMITY ROM: Grossly WNLs, assess further as needed Active ROM Right eval Left eval Left 05/29/22  Hip flexion       Hip extension       Hip abduction       Hip adduction       Hip internal rotation       Hip external rotation       Knee flexion       Knee extension       Ankle dorsiflexion     3  Ankle plantarflexion     50  Ankle inversion       Ankle eversion        (Blank rows = not tested)   LOWER EXTREMITY MMT:   MMT Right eval Left eval Left 05/29/22  Hip flexion 3 2+   Hip extension 3 2+   Hip abduction 3+ 2   Hip adduction       Hip internal rotation       Hip external rotation 4 3   Knee flexion 4+ 4+   Knee extension 4+ 4+   Ankle dorsiflexion   3 p   Ankle plantarflexion   2 p   Ankle inversion   2 p 4-  Ankle eversion   2 p 4- p   (Blank rows = not tested)   LOWER EXTREMITY SPECIAL TESTS:  Ankle special tests: NT   FUNCTIONAL TESTS:  5 times sit to stand: : 05/15/22: 17.12 sec  2 minute walk test: 05/29/22: 351 feet SLS 3-4 sec left 06/03/22 SLS 11 sec right 06/03/22   GAIT: Distance walked: 200' Assistive device utilized: None Level of assistance: Complete Independence Comments: Antalgic and Trendelenberg gait, bilaterally L>R     TODAY'S TREATMENT:  [facility] Adult PT Treatment:                                                DATE: 06/05/22 Therapeutic Exercise: Nustep L5 UE/LE x 5 minutes  Supine SLR 10 x 2  Supine ankle circles CW/CCW Seated Heel raise x15 Seated toe raise x 15   Modalities: [place] to left lateral lower leg- proximal and distal : 1.0  Continuous, 1.2 w/cm2 x 10 minutes  HMP left lateral leg x 8 minutes in sidelying    [facility] Adult PT Treatment:                                                DATE: 06/03/22 Therapeutic Exercise: Tandem 15 sec , pain 3/4 tandem 30 sec, pain Standing hip abduction 10 x 2 each , 2# (lateral left hip pain). AIREX pad rhomberg  with trunk rotations Bilat heel raises Rocker board, wooden with UE 1 minute Gastroc stretch bilateral   Manual Therapy: IASTM along lateral lower leg following TPDN Trigger Point Dry Needling Treatment: Performed by Joellyn Rued, PT Pre-treatment instruction: Patient instructed on dry needling rationale, procedures, and possible side effects including pain during treatment (achy,cramping feeling), bruising, drop of blood, lightheadedness, nausea, sweating. Patient Consent Given: Yes Education handout provided: Yes Muscles treated: L peroneal longus and brevis  Needle size and number: .25x67mm x 2 Electrical stimulation performed: No Parameters: N/A Treatment response/outcome: Twitch response elicited Post-treatment instructions: Patient instructed to expect possible mild to moderate muscle soreness later today and/or tomorrow. Patient instructed in methods to reduce muscle soreness and to continue prescribed HEP. If patient was dry needled over the lung field, patient was instructed on signs and symptoms of pneumothorax and, however unlikely, to see immediate medical attention should they occur. Patient was also educated on signs and symptoms of infection and to seek medical attention should they occur. Patient verbalized understanding of these instructions and education.   [facility] Adult PT Treatment:                                                DATE: 05/29/22 Therapeutic Exercise: Tandem 15 sec , pain 3/4 tandem 30 sec, pain Standing gastroc stretch  Standing soleus stretch  Standing hip abduction 10 x 2 each  Side stepping  Slant board 30 sec x 2  AIREX pad rhomberg with trunk rotations [place] 10 x 1  Hip abduction 10 x 1 , 10 x 1 with concentric assist S/L clam 10 x 2  Supine piriformis stretch left   [facility] Adult PT Treatment:                                                DATE: 05/27/22 Therapeutic Exercise: Tandem stance - unable due to lateral leg pain SLS right 15 sec SLS left  , <5 sec, pain.  Semi tandem - increased pain AIREX pad - rhomberg with head turns and nods Slant board stretch, tolerated < 30 sec Seated Heel raises  Seated gastroc  Seated solues stretch Seated ankle circles    [facility] Adult PT Treatment:                                                DATE: 05/22/22 Therapeutic Exercise: Theraband strengthening for L ankle ever and PF YTB 3x10 S/L hip abd 3x10 S/L hip ER 3x10 SL stance L LE  c hand assist Manual Therapy: STM to the lateral L calf Skilled palpation to identify TrPs and taut muscle bands Trigger Point Dry Needling Treatment: Pre-treatment instruction: Patient instructed on dry needling rationale, procedures, and possible side effects including pain during treatment (achy,cramping feeling), bruising, drop of blood, lightheadedness, nausea, sweating. Patient Consent Given: Yes Education handout provided: Yes Muscles treated: L peroneal longus and brevis  Needle size and number: .25x2930mm x 2 Electrical stimulation performed: No Parameters: N/A Treatment response/outcome: Twitch response elicited Post-treatment instructions: Patient instructed to expect possible mild to moderate muscle soreness later today and/or tomorrow. Patient instructed in methods to reduce muscle soreness and to continue prescribed HEP. If patient was dry needled over the lung field, patient was instructed on signs and symptoms of pneumothorax and, however unlikely, to see immediate medical attention should they occur. Patient was also educated on signs and symptoms of infection and to seek medical attention should they occur. Patient verbalized understanding of these instructions and education.           PATIENT EDUCATION:  Education details: Eval findings, POC, HEP, self care  Person educated: Patient Education method: Explanation, Demonstration, Tactile cues, Verbal cues, and Handouts Education comprehension: verbalized understanding, returned demonstration,  verbal cues required, and tactile cues required   HOME EXERCISE PROGRAM: Access Code: [email or web address] URL: [email or web address] Date: 05/01/2022 Prepared by: Joellyn RuedAllen Ralls  Exercises - Active Straight Leg Raise with Quad Set  - 1 x daily - 7 x weekly - 3 sets - 10 reps - 3 hold - Standing Hip Abduction with Counter Support  - 1 x daily - 7 x weekly - 3 sets - 10 reps - 3 hold - Standing Tandem Balance with Counter Support  - 1 x daily - 7 x weekly - 1 sets - 10 reps - 30-60 hold - Clamshell with Resistance  - 1 x daily - 7 x weekly - 3 sets - 10 reps - 3 hold - Seated Ankle Eversion with Resistance  - 1 x daily - 7 x weekly - 3 sets - 10 reps - 3 hold Added 05/09/22 - Seated Gastroc Stretch with Strap  - 1 x daily - 7 x weekly - 1 sets - 3 reps - 30 hold - Seated Soleus Stretch with Strap  - 1 x daily - 7 x weekly - 1 sets - 3 reps - 30 hold - Supine Ankle Circles  - 1 x daily - 7 x weekly - 1 sets - 3 reps - 30 hold 05/15/22:   Gastroc Stretch on Wall  - 1 x daily - 7 x weekly - 1 sets - 3 reps - 30 hold 05/20/22 - Seated Ankle Inversion with Resistance  - 1 x daily - 7 x weekly - 2-3 sets - 10 reps - Seated Ankle Dorsiflexion with Resistance  - 1 x daily - 7 x weekly - 2-3 sets - 10 reps 05/27/22 - Heel Raises with Counter Support  - 1 x daily - 7 x weekly - 2-3 sets - 10 reps   ASSESSMENT:   CLINICAL IMPRESSION: Pt reports significant increase in pain since last session involving TPDN. Pt arrives 30 minutes late this morning due to going to the wrong clinic. He rates pain at 10/10 today at proximal peroneals and mid-distal peroneals. Session spent with attempts at pain relief. Therapeutic US performed as well as HMP to lateral lower leg. Light open chain therex completed with pt reporting pain with all movements today. Also tingling at lateral ankle.  At end of session, he reported no change in pain. He was encouraged to continue ice and heat applications, rest and stretch as  soreness resolves. Will check pain next visit.      OBJECTIVE IMPAIRMENTS: Abnormal gait, decreased activity tolerance, difficulty walking, decreased strength, obesity, and pain.    ACTIVITY LIMITATIONS: carrying, lifting, bending, sitting, standing, squatting, sleeping, stairs, and locomotion level   PARTICIPATION LIMITATIONS: meal prep, cleaning, and laundry   PERSONAL FACTORS: High BMI, Hx of R TKA are also affecting patient's functional outcome.    REHAB POTENTIAL: Good   CLINICAL DECISION MAKING: Stable/uncomplicated   EVALUATION COMPLEXITY: Low     GOALS:   SHORT TERM GOALS: Target date: 05/16/22 Pt will be Ind in an initial HEP  Baseline: Initiated Goal status: MET   2.  Pt will voice understanding of measures to assist in pain reduction  Baseline: initaited Goal status: MET   LONG TERM GOALS: Target date: 06/13/22   Pt will be Ind in a final HEP to maintain achieved LOF  Baseline: initiated Goal status: INITIAL   2.  Increase L hip and ankle strength to 4/5 to improve gait and decrease L ankle pain Baseline: see flow sheets 05/29/22: improving ankle strength Goal status: ONGOING   3.  Pt will report a decrease in L ankle pain to 0-4/10 for improved function and QOL Baseline: 3-10/10 05/29/22: has more good days now Goal status: ONGOING   4.  Improve 5xSTS by MCID of 5" and by MCID of 12ft as indication of improved functional mobility and quality of gait with reduce antalgic/trendelenberg gait Baseline: see functional tests Goal status: ONGOING   5.  Pt's FOTO score will improved to the predicted value of 70% as indication of improved function  Baseline: 54% 05/20/22: 45% Goal status: ONGOING       PLAN:   PT FREQUENCY: 2x/week   PT DURATION: 6 weeks   PLANNED INTERVENTIONS: Therapeutic exercises, Therapeutic activity, Neuromuscular re-education, Balance training, Gait training, Patient/Family education, Self Care, Joint mobilization, Stair training,  Aquatic Therapy, Dry Needling, Electrical stimulation, Cryotherapy, Moist heat, Taping, Vasopneumatic device, Ultrasound, Ionotophoresis 4mg /ml Dexamethasone, Manual therapy, and Re-evaluation   PLAN FOR NEXT SESSION: assess response to HEP; progress therex as indicated; use of modalities, manual therapy; repeat TPDN, RE-eval next week - NEXT MD appt 4/16  Jannette Spanner, PTA 06/05/22 8:34 AM Phone: [phone] Fax: [phone]
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
## 2022-06-10 ENCOUNTER — Ambulatory Visit (INDEPENDENT_AMBULATORY_CARE_PROVIDER_SITE_OTHER)
Admission: RE | Admit: 2022-06-10 | Discharge: 2022-06-10 | Disposition: A | Discharge status: Home / Self Care | Payer: PPO | Source: Ambulatory Visit | Attending: Family Medicine | Admitting: Family Medicine

## 2022-06-10 ENCOUNTER — Encounter: Payer: Self-pay | Admitting: Family Medicine

## 2022-06-10 ENCOUNTER — Ambulatory Visit (INDEPENDENT_AMBULATORY_CARE_PROVIDER_SITE_OTHER): Payer: PPO | Admitting: Family Medicine

## 2022-06-10 ENCOUNTER — Ambulatory Visit: Payer: PPO

## 2022-06-10 VITALS — BP 120/72 | HR 73 | Ht 66.0 in | Wt 210.0 lb

## 2022-06-10 DIAGNOSIS — G8929 Other chronic pain: Secondary | ICD-10-CM

## 2022-06-10 DIAGNOSIS — M545 Low back pain, unspecified: Secondary | ICD-10-CM | POA: Diagnosis not present

## 2022-06-10 DIAGNOSIS — M25572 Pain in left ankle and joints of left foot: Secondary | ICD-10-CM

## 2022-06-10 DIAGNOSIS — M6281 Muscle weakness (generalized): Secondary | ICD-10-CM

## 2022-06-10 DIAGNOSIS — R262 Difficulty in walking, not elsewhere classified: Secondary | ICD-10-CM

## 2022-06-10 NOTE — Therapy (Signed)
OUTPATIENT PHYSICAL THERAPY TREATMENT NOTE/DIscharge   Patient Name: Scott [name] MRN: [id number] DOB:20-Jan-1956, 67 y.o., male Today's Date: 06/12/2022  PCP: Corwin Levins, MD  REFERRING PROVIDER: Rodolph Bong, MD   END OF SESSION:   PT End of Session - 06/12/22 0727     Visit Number 13    Number of Visits 13    Date for PT Re-Evaluation 06/13/22    Authorization Type [employer]    PT Start Time 0720    PT Stop Time 0800    PT Time Calculation (min) 40 min    Activity Tolerance Patient tolerated treatment well    Behavior During Therapy [facility] for tasks assessed/performed              Past Medical History:  Diagnosis Date   ALLERGIC RHINITIS 01/14/2007   Allergy    ANXIETY 09/28/2006   Asbestos exposure    Atrial fibrillation 09/28/2006   s/p afib ablation x 2   CHF (congestive heart failure)    CKD (chronic kidney disease), stage III    patient denies   COPD (chronic obstructive pulmonary disease)    Coronary artery disease 11/24/2011   Diastolic dysfunction 09/28/2006   Dyspnea    W/ EXERTION    Dysrhythmia    AFIB      First degree AV block 10/12/2018   Noted on EKG   Gallstones    Hyperglycemia    HYPERLIPIDEMIA 09/28/2006   HYPERTENSION 09/28/2006   HYPOTHYROIDISM, ACQUIRED NEC 09/28/2006   pt denies and doesn't know   Long term current use of anticoagulant 04/08/2010   Obese    OBSTRUCTIVE SLEEP APNEA 01/06/2008   NOT USING     Right knee DJD 07/09/2010   S/P CABG x 1 11/26/2011   Right internal mammary artery to right coronary artery   S/P Maze operation for atrial fibrillation 11/26/2011   complete biatrial lesion set with clipping of LA appendage   Sleep apnea    Past Surgical History:  Procedure Laterality Date   ANTERIOR CERVICAL DECOMP/DISCECTOMY FUSION N/A 06/20/2016   Procedure: Anterior Cervical Discectomy Fusion - Cervical five-Cervical six - Cervical six-Cervical seven - Cervicla seven- Thoracic one;  Surgeon: Julio Sicks, MD;   Location: [facility] OR;  Service: Neurosurgery;  Laterality: N/A;   CARDIOVERSION     X4    CORONARY ARTERY BYPASS GRAFT  11/26/2011   Procedure: CORONARY ARTERY BYPASS GRAFTING (CABG);  Surgeon: Purcell Nails, MD;  Location: [facility] OR;  Service: Open Heart Surgery;  Laterality: N/A;  coronary artery bypass on pump times one utilizing the right internal mammary artery, transesophageal echocardiogram    HAND SURGERY Left    KNEE ARTHROSCOPY Right 12/10/2021   Procedure: RIGH TKNEE ARTHROSCOPY WITH SCAR BAND EXCISION;  Surgeon: Marcene Corning, MD;  Location: [facility] ORS;  Service: Orthopedics;  Laterality: Right;   LEFT HEART CATHETERIZATION WITH CORONARY ANGIOGRAM N/A 11/24/2011   Procedure: LEFT HEART CATHETERIZATION WITH CORONARY ANGIOGRAM;  Surgeon: Peter M Swaziland, MD;  Location: [facility] CATH LAB;  Service: Cardiovascular;  Laterality: N/A;   LOOP RECORDER IMPLANT N/A 07/06/2012   Procedure: LOOP RECORDER IMPLANT;  Surgeon: [name], MD;  Location: [facility] CATH LAB;  Service: Cardiovascular;  Laterality: N/A;   MAZE  11/26/2011   Procedure: MAZE;  Surgeon: Purcell Nails, MD;  Location: [facility] OR;  Service: Open Heart Surgery;  Laterality: N/A;  Cryomaze    Radiofrequency Ablation for atrial fibrillation  12/30/07 and 06/05/09  afib ablation x 2 by [facility]   Radiofrequency ablation for atrial flutter  2005   CTI   TOTAL KNEE ARTHROPLASTY Right 04/05/2019   Procedure: RIGHT TOTAL KNEE ARTHROPLASTY;  Surgeon: Marcene Corning, MD;  Location: [facility] ORS;  Service: Orthopedics;  Laterality: Right;   Patient Active Problem List   Diagnosis Date Noted   Left ankle pain 04/07/2022   Left cataract 12/30/2021   Rash 11/14/2021   Axillary lymphadenopathy 11/14/2021   Low back pain 01/01/2021   Lower GI bleeding 01/01/2021   Pain due to total right knee replacement 01/09/2020   Primary osteoarthritis of right knee 04/05/2019   CKD (chronic kidney disease) stage 3, GFR 30-59 ml/min 03/14/2019   Degenerative joint disease of knee,  right 09/27/2018   Dyspnea 09/06/2018   Right knee pain 09/06/2018   Radiculitis of left cervical region 03/05/2018   Plantar fasciitis, left 08/19/2017   Chest pain 02/27/2017   Right ear pain 02/27/2017   Left groin pain 09/13/2016   Rib pain on left side 09/13/2016   Left leg pain 09/13/2016   Stenosis of cervical spine with myelopathy (HCC) 06/20/2016   Special screening for malignant neoplasms, colon 04/04/2016   Cough 01/11/2016   COPD exacerbation 12/22/2015   Rectal bleeding 08/14/2014   Intercostal muscle strain 08/07/2014   Chronic meniscal tear of knee 04/06/2014   Heel bone fracture 04/06/2014   Hyperglycemia 04/04/2014   Recurrent falls 04/04/2014   Bilateral knee pain 04/04/2014   Intractable left heel pain 04/04/2014   Insomnia 04/04/2014   Hematochezia 04/04/2014   Bladder neck obstruction 12/19/2013   Encounter for therapeutic drug monitoring 03/30/2013   Palpitations 07/06/2012   Shortness of breath 01/19/2012   Sick sinus syndrome 12/01/2011   S/P CABG x 1 11/26/2011   S/P Maze operation for atrial fibrillation 11/26/2011   CAD (coronary artery disease) 11/25/2011   Paroxysmal atrial fibrillation 11/24/2011   Chronic combined systolic (congestive) and diastolic (congestive) heart failure 11/24/2011   Gross hematuria 12/12/2010   Left knee pain 12/12/2010   Left knee DJD 07/09/2010   Encounter for well adult exam with abnormal findings 07/07/2010   Chronic anticoagulation 04/08/2010   COPD (chronic obstructive pulmonary disease) (HCC) 10/04/2008   OBSTRUCTIVE SLEEP APNEA 01/06/2008   ALLERGIC RHINITIS 01/14/2007   Hypothyroidism 09/28/2006   Hyperlipidemia 09/28/2006   Anxiety state 09/28/2006   Essential hypertension 09/28/2006    REFERRING DIAG: M25.572,G89.29 (ICD-10-CM) - Chronic pain of left ankle    THERAPY DIAG:  Pain in left ankle and joints of left foot  Muscle weakness (generalized)  Difficulty in walking, not elsewhere  classified  Rationale for Evaluation and Treatment Rehabilitation  ONSET DATE: Approx 8 months   SUBJECTIVE:    SUBJECTIVE STATEMENT: Pt offeres no new report. He notes overall improvement of L lateral ankle pain, but still has periods of significant pain. PAIN:  Are you having pain? Yes: NPRS scale: 5/10 Pain location: L lateral ankle  Pain description: Sharp Aggravating factors: Walking, certain positions Relieving factors: Tylenol, tramadol, rest, cold pack Pain range: 3-10/10  PERTINENT HISTORY: High BMI, Hx of R TKA   PRECAUTIONS: None   WEIGHT BEARING RESTRICTIONS: No   FALLS:  Has patient fallen in last 6 months? Yes. Number of falls 2 L Ankle giving way, or dogs pulling on him   LIVING ENVIRONMENT: Lives with: lives alone Lives in: House/apartment Able to access and be mobile in home   OCCUPATION: 1 day a week, stands all day  PLOF: Independent   PATIENT GOALS: Less pain and to walk better   NEXT MD VISIT: Not currently scheduled   OBJECTIVE: (objective measures completed at initial evaluation unless otherwise dated)   DIAGNOSTIC FINDINGS: 04/08/22 DG ankle FINDINGS: There is no evidence of fracture, dislocation, or joint effusion. There is no evidence of arthropathy or other focal bone abnormality. Soft tissues are unremarkable. There are posterior and plantar calcaneal spurs.   IMPRESSION: Calcaneal spurs.  No acute osseous abnormalities.   04/08/22/ [facility] Ultrasound of: Left ankle Mild joint effusion present at lateral ankle. Posterior tibialis tendons and peroneal tendons are normal-appearing without tear or severe tenosynovitis. Impression: Mild joint effusion   PATIENT SURVEYS:  FOTO: Perceived function   54%, predicted   70%    COGNITION: Overall cognitive status: Within functional limits for tasks assessed                         SENSATION: WFL   EDEMA:  None palpated or observed   MUSCLE LENGTH: Hamstrings:  Right NT deg; Left NT deg Thomas test: Right NT deg; Left NT deg   POSTURE: rounded shoulders, forward head, decreased lumbar lordosis, and increased thoracic kyphosis   PALPATION: TTP along the peroneal muscles and tendons   LOWER EXTREMITY ROM: Grossly WNLs, assess further as needed Active ROM Right eval Left eval Left 05/29/22  Hip flexion       Hip extension       Hip abduction       Hip adduction       Hip internal rotation       Hip external rotation       Knee flexion       Knee extension       Ankle dorsiflexion     3  Ankle plantarflexion     50  Ankle inversion       Ankle eversion        (Blank rows = not tested)   LOWER EXTREMITY MMT:   MMT Right eval Left eval Left 05/29/22 LT  Hip flexion 3 2+    Hip extension 3 2+    Hip abduction 3+ 2    Hip adduction        Hip internal rotation        Hip external rotation 4 3    Knee flexion 4+ 4+    Knee extension 4+ 4+    Ankle dorsiflexion   3 p  4+  Ankle plantarflexion   2 p  4  Ankle inversion   2 p 4- 4+  Ankle eversion   2 p 4- p 4   (Blank rows = not tested)   LOWER EXTREMITY SPECIAL TESTS:  Ankle special tests: NT   FUNCTIONAL TESTS:  5 times sit to stand: : 05/15/22: 17.12 sec  2 minute walk test: 05/29/22: 351 feet SLS 3-4 sec left 06/03/22 SLS 11 sec right 06/03/22   GAIT: Distance walked: 200' Assistive device utilized: None Level of assistance: Complete Independence Comments: Antalgic and Trendelenberg gait, bilaterally L>R  [facility] Adult PT Treatment:                                                DATE: 06/12/22 Therapeutic Exercise: Standing heel raises 2x10 Standing hip abd RTB 2x10 Standing hip ext  RTB 2x10 Gastroc stretch x2 30" Seated DF RTB 2x10 Seated Ever RTB 2x10 Final HEP Therapeutic Activity: 2 min walking test Standing tandem balance  [facility] Adult PT Treatment:                                                DATE: 06/10/22 Therapeutic Exercise: S/L hip abd 2x12 S/L hip ER 2x12  GTB Bridging 2x12 Side steps 2x10 airex Therapeutic Activity: FOTO completed and reviewed 5xSTS x 2 Tandem 30 sec SLS 7" for L   TODAY'S TREATMENT:  [facility] Adult PT Treatment:                                                DATE: 06/05/22 Therapeutic Exercise: Nustep L5 UE/LE x 5 minutes  Supine SLR 10 x 2  Supine ankle circles CW/CCW Seated Heel raise x15 Seated toe raise x 15   Modalities: [place] to left lateral lower leg- proximal and distal : 1.0 Continuous, 1.2 w/cm2 x 10 minutes  HMP left lateral leg x 8 minutes in sidelying  PATIENT EDUCATION:  Education details: Eval findings, POC, HEP, self care  Person educated: Patient Education method: Explanation, Demonstration, Tactile cues, Verbal cues, and Handouts Education comprehension: verbalized understanding, returned demonstration, verbal cues required, and tactile cues required   HOME EXERCISE PROGRAM: Access Code: [email or web address] URL: [email or web address] Date: 06/12/2022 Prepared by: Joellyn Rued  Exercises - Gastroc Stretch on Wall  - 1 x daily - 7 x weekly - 1 sets - 3 reps - 30 hold - Heel Raises with Counter Support  - 1 x daily - 7 x weekly - 2 sets - 10 reps - Standing Hip Abduction with Counter Support  - 1 x daily - 7 x weekly - 3 sets - 10 reps - 3 hold - Standing Hip Extension with Counter Support  - 1 x daily - 7 x weekly - 3 sets - 10 reps - Standing Tandem Balance with Counter Support  - 1 x daily - 7 x weekly - 1 sets - 10 reps - 30-60 hold - Seated Ankle Dorsiflexion with Resistance  - 1 x daily - 7 x weekly - 2-3 sets - 10 reps - Seated Ankle Eversion with Resistance  - 1 x daily - 7 x weekly - 2-3 sets - 10 reps - 3 hold   ASSESSMENT:   CLINICAL IMPRESSION: Pt completed his last PT session today. and L ankle strength were reassessed and both were improved and the goals met. Pt also completed strengthening and balance therex with his HEP updated. Pt returned proper demonstration. Overall with  the course of PT, the pt's L ankle strength and function has improved meeting set goals. Re: pain, the pt reports it has improved with it not being at a high level as often, but he still experiences high levels of pain. Pt's L hip is weak and he walks c a Trendelenburg gait pattern which seams to be a major contributing factor with the stress placed on the L ankle evertors.  Pt is to have a MRI for his low back on 4/21. Pt is Dced from [facility] to a HEP.  OBJECTIVE IMPAIRMENTS: Abnormal gait, decreased activity tolerance, difficulty walking, decreased strength, obesity,  and pain.    ACTIVITY LIMITATIONS: carrying, lifting, bending, sitting, standing, squatting, sleeping, stairs, and locomotion level   PARTICIPATION LIMITATIONS: meal prep, cleaning, and laundry   PERSONAL FACTORS: High BMI, Hx of R TKA are also affecting patient's functional outcome.    REHAB POTENTIAL: Good   CLINICAL DECISION MAKING: Stable/uncomplicated   EVALUATION COMPLEXITY: Low     GOALS:   SHORT TERM GOALS: Target date: 05/16/22 Pt will be Ind in an initial HEP  Baseline: Initiated Goal status: MET   2.  Pt will voice understanding of measures to assist in pain reduction  Baseline: initaited Goal status: MET   LONG TERM GOALS: Target date: 06/13/22   Pt will be Ind in a final HEP to maintain achieved LOF  Baseline: initiated Goal status: MET   2.  Increase L hip and ankle strength to 4/5 to improve gait and decrease L ankle pain Baseline: see flow sheets 05/29/22: improving ankle strength 06/12/22-see flow sheets Goal status: MET   3.  Pt will report a decrease in L ankle pain to 0-4/10 for improved function and QOL Baseline: 3-10/10 05/29/22: has more good days now Goal status: Improved, not met   4.  Improve 5xSTS by MCID of 5" and by MCID of 79ft as indication of improved functional mobility and quality of gait with reduce antalgic/trendelenberg gait Baseline: see functional tests Status: 06/10/22;  5xSTS=10.6". 4/81/24= 421' Goal status: MET   5.  Pt's FOTO score will improved to the predicted value of 70% as indication of improved function  Baseline: 54% 05/20/22: 45%; 06/10/22=62% Goal status: Improved, not met       PLAN:   PT FREQUENCY: 2x/week   PT DURATION: 6 weeks   PLANNED INTERVENTIONS: Therapeutic exercises, Therapeutic activity, Neuromuscular re-education, Balance training, Gait training, Patient/Family education, Self Care, Joint mobilization, Stair training, Aquatic Therapy, Dry Needling, Electrical stimulation, Cryotherapy, Moist heat, Taping, Vasopneumatic device, Ultrasound, Ionotophoresis 4mg /ml Dexamethasone, Manual therapy, and Re-evaluation   PLAN FOR NEXT SESSION:   PHYSICAL THERAPY DISCHARGE SUMMARY  Visits from Start of Care: 13  Current functional level related to goals / functional outcomes: See clinical impression and PT goals    Remaining deficits: See clinical impression and PT goals    Education / Equipment: HEP; ankle brace   Patient agrees to discharge. Patient goals were  met or improved . Patient is being discharged due to maximized rehab potential.    Joellyn Rued MS, PT 06/12/22 12:58 PM

## 2022-06-10 NOTE — Patient Instructions (Addendum)
Thank you for coming in today.   You should hear from MRI scheduling within 1 week. If you do not hear please let me know.    Please get an Xray today at our [place] office  Check back after MRI

## 2022-06-10 NOTE — Therapy (Signed)
OUTPATIENT PHYSICAL THERAPY TREATMENT NOTE   Patient Name: Scott [name] MRN: [id number] DOB:08/20/1955, 67 y.o., male Today's Date: 06/10/2022  PCP: Corwin Levins, MD  REFERRING PROVIDER: Rodolph Bong, MD   END OF SESSION:   PT End of Session - 06/10/22 0722     Visit Number 12    Number of Visits 13    Date for PT Re-Evaluation 06/13/22    Authorization Type [employer]    PT Start Time 0718    PT Stop Time 0801    PT Time Calculation (min) 43 min    Activity Tolerance Patient tolerated treatment well    Behavior During Therapy [facility] for tasks assessed/performed              Past Medical History:  Diagnosis Date   ALLERGIC RHINITIS 01/14/2007   Allergy    ANXIETY 09/28/2006   Asbestos exposure    Atrial fibrillation 09/28/2006   s/p afib ablation x 2   CHF (congestive heart failure)    CKD (chronic kidney disease), stage III    patient denies   COPD (chronic obstructive pulmonary disease)    Coronary artery disease 11/24/2011   Diastolic dysfunction 09/28/2006   Dyspnea    W/ EXERTION    Dysrhythmia    AFIB      First degree AV block 10/12/2018   Noted on EKG   Gallstones    Hyperglycemia    HYPERLIPIDEMIA 09/28/2006   HYPERTENSION 09/28/2006   HYPOTHYROIDISM, ACQUIRED NEC 09/28/2006   pt denies and doesn't know   Long term current use of anticoagulant 04/08/2010   Obese    OBSTRUCTIVE SLEEP APNEA 01/06/2008   NOT USING     Right knee DJD 07/09/2010   S/P CABG x 1 11/26/2011   Right internal mammary artery to right coronary artery   S/P Maze operation for atrial fibrillation 11/26/2011   complete biatrial lesion set with clipping of LA appendage   Sleep apnea    Past Surgical History:  Procedure Laterality Date   ANTERIOR CERVICAL DECOMP/DISCECTOMY FUSION N/A 06/20/2016   Procedure: Anterior Cervical Discectomy Fusion - Cervical five-Cervical six - Cervical six-Cervical seven - Cervicla seven- Thoracic one;  Surgeon: Julio Sicks, MD;  Location:  [facility] OR;  Service: Neurosurgery;  Laterality: N/A;   CARDIOVERSION     X4    CORONARY ARTERY BYPASS GRAFT  11/26/2011   Procedure: CORONARY ARTERY BYPASS GRAFTING (CABG);  Surgeon: Purcell Nails, MD;  Location: [facility] OR;  Service: Open Heart Surgery;  Laterality: N/A;  coronary artery bypass on pump times one utilizing the right internal mammary artery, transesophageal echocardiogram    HAND SURGERY Left    KNEE ARTHROSCOPY Right 12/10/2021   Procedure: RIGH TKNEE ARTHROSCOPY WITH SCAR BAND EXCISION;  Surgeon: Marcene Corning, MD;  Location: [facility] ORS;  Service: Orthopedics;  Laterality: Right;   LEFT HEART CATHETERIZATION WITH CORONARY ANGIOGRAM N/A 11/24/2011   Procedure: LEFT HEART CATHETERIZATION WITH CORONARY ANGIOGRAM;  Surgeon: Peter M Swaziland, MD;  Location: [facility] CATH LAB;  Service: Cardiovascular;  Laterality: N/A;   LOOP RECORDER IMPLANT N/A 07/06/2012   Procedure: LOOP RECORDER IMPLANT;  Surgeon: [name], MD;  Location: [facility] CATH LAB;  Service: Cardiovascular;  Laterality: N/A;   MAZE  11/26/2011   Procedure: MAZE;  Surgeon: Purcell Nails, MD;  Location: [facility] OR;  Service: Open Heart Surgery;  Laterality: N/A;  Cryomaze    Radiofrequency Ablation for atrial fibrillation  12/30/07 and 06/05/09  afib ablation x 2 by [facility]   Radiofrequency ablation for atrial flutter  2005   CTI   TOTAL KNEE ARTHROPLASTY Right 04/05/2019   Procedure: RIGHT TOTAL KNEE ARTHROPLASTY;  Surgeon: Marcene Corning, MD;  Location: [facility] ORS;  Service: Orthopedics;  Laterality: Right;   Patient Active Problem List   Diagnosis Date Noted   Left ankle pain 04/07/2022   Left cataract 12/30/2021   Rash 11/14/2021   Axillary lymphadenopathy 11/14/2021   Low back pain 01/01/2021   Lower GI bleeding 01/01/2021   Pain due to total right knee replacement 01/09/2020   Primary osteoarthritis of right knee 04/05/2019   CKD (chronic kidney disease) stage 3, GFR 30-59 ml/min 03/14/2019   Degenerative joint disease of knee, right  09/27/2018   Dyspnea 09/06/2018   Right knee pain 09/06/2018   Radiculitis of left cervical region 03/05/2018   Plantar fasciitis, left 08/19/2017   Chest pain 02/27/2017   Right ear pain 02/27/2017   Left groin pain 09/13/2016   Rib pain on left side 09/13/2016   Left leg pain 09/13/2016   Stenosis of cervical spine with myelopathy (HCC) 06/20/2016   Special screening for malignant neoplasms, colon 04/04/2016   Cough 01/11/2016   COPD exacerbation 12/22/2015   Rectal bleeding 08/14/2014   Intercostal muscle strain 08/07/2014   Chronic meniscal tear of knee 04/06/2014   Heel bone fracture 04/06/2014   Hyperglycemia 04/04/2014   Recurrent falls 04/04/2014   Bilateral knee pain 04/04/2014   Intractable left heel pain 04/04/2014   Insomnia 04/04/2014   Hematochezia 04/04/2014   Bladder neck obstruction 12/19/2013   Encounter for therapeutic drug monitoring 03/30/2013   Palpitations 07/06/2012   Shortness of breath 01/19/2012   Sick sinus syndrome 12/01/2011   S/P CABG x 1 11/26/2011   S/P Maze operation for atrial fibrillation 11/26/2011   CAD (coronary artery disease) 11/25/2011   Paroxysmal atrial fibrillation 11/24/2011   Chronic combined systolic (congestive) and diastolic (congestive) heart failure 11/24/2011   Gross hematuria 12/12/2010   Left knee pain 12/12/2010   Left knee DJD 07/09/2010   Encounter for well adult exam with abnormal findings 07/07/2010   Chronic anticoagulation 04/08/2010   COPD (chronic obstructive pulmonary disease) (HCC) 10/04/2008   OBSTRUCTIVE SLEEP APNEA 01/06/2008   ALLERGIC RHINITIS 01/14/2007   Hypothyroidism 09/28/2006   Hyperlipidemia 09/28/2006   Anxiety state 09/28/2006   Essential hypertension 09/28/2006    REFERRING DIAG: M25.572,G89.29 (ICD-10-CM) - Chronic pain of left ankle    THERAPY DIAG:  Pain in left ankle and joints of left foot  Muscle weakness (generalized)  Difficulty in walking, not elsewhere  classified  Rationale for Evaluation and Treatment Rehabilitation  ONSET DATE: Approx 8 months   SUBJECTIVE:    SUBJECTIVE STATEMENT: Pt reports overall improvement, but has periods of significant pain. He notes he has been walking his dogs and cutting his grass. Pt reports periods of higher pain are less often.   PAIN:  Are you having pain? Yes: NPRS scale: 4/10 Pain location: L lateral ankle  Pain description: Sharp Aggravating factors: Walking, certain positions Relieving factors: Tylenol, tramadol, rest, cold pack Pain range: 3-10/10  PERTINENT HISTORY: High BMI, Hx of R TKA   PRECAUTIONS: None   WEIGHT BEARING RESTRICTIONS: No   FALLS:  Has patient fallen in last 6 months? Yes. Number of falls 2 L Ankle giving way, or dogs pulling on him   LIVING ENVIRONMENT: Lives with: lives alone Lives in: House/apartment Able to access and be mobile in  home   OCCUPATION: 1 day a week, stands all day   PLOF: Independent   PATIENT GOALS: Less pain and to walk better   NEXT MD VISIT: Not currently scheduled   OBJECTIVE: (objective measures completed at initial evaluation unless otherwise dated)   DIAGNOSTIC FINDINGS: 04/08/22 DG ankle FINDINGS: There is no evidence of fracture, dislocation, or joint effusion. There is no evidence of arthropathy or other focal bone abnormality. Soft tissues are unremarkable. There are posterior and plantar calcaneal spurs.   IMPRESSION: Calcaneal spurs.  No acute osseous abnormalities.   04/08/22/ Diagnostic Limited MSK Ultrasound of: Left ankle Mild joint effusion present at lateral ankle. Posterior tibialis tendons and peroneal tendons are normal-appearing without tear or severe tenosynovitis. Impression: Mild joint effusion   PATIENT SURVEYS:  FOTO: Perceived function   54%, predicted   70%  05/20/22: 45%   COGNITION: Overall cognitive status: Within functional limits for tasks assessed                          SENSATION: WFL   EDEMA:  None palpated or observed   MUSCLE LENGTH: Hamstrings: Right NT deg; Left NT deg Maisie Fus test: Right NT deg; Left NT deg   POSTURE: rounded shoulders, forward head, decreased lumbar lordosis, and increased thoracic kyphosis   PALPATION: TTP along the peroneal muscles and tendons   LOWER EXTREMITY ROM: Grossly WNLs, assess further as needed Active ROM Right eval Left eval Left 05/29/22  Hip flexion       Hip extension       Hip abduction       Hip adduction       Hip internal rotation       Hip external rotation       Knee flexion       Knee extension       Ankle dorsiflexion     3  Ankle plantarflexion     50  Ankle inversion       Ankle eversion        (Blank rows = not tested)   LOWER EXTREMITY MMT:   MMT Right eval Left eval Left 05/29/22  Hip flexion 3 2+   Hip extension 3 2+   Hip abduction 3+ 2   Hip adduction       Hip internal rotation       Hip external rotation 4 3   Knee flexion 4+ 4+   Knee extension 4+ 4+   Ankle dorsiflexion   3 p   Ankle plantarflexion   2 p   Ankle inversion   2 p 4-  Ankle eversion   2 p 4- p   (Blank rows = not tested)   LOWER EXTREMITY SPECIAL TESTS:  Ankle special tests: NT   FUNCTIONAL TESTS:  5 times sit to stand: : 05/15/22: 17.12 sec  2 minute walk test: 05/29/22: 351 feet SLS 3-4 sec left 06/03/22 SLS 11 sec right 06/03/22   GAIT: Distance walked: 200' Assistive device utilized: None Level of assistance: Complete Independence Comments: Antalgic and Trendelenberg gait, bilaterally L>R  [facility] Adult PT Treatment:                                                DATE: 06/10/22 Therapeutic Exercise: S/L hip abd 2x12 S/L hip ER 2x12 GTB Bridging  2x12 Side steps 2x10 airex Therapeutic Activity: FOTO completed and reviewed 5xSTS x 2 Tandem 30 sec SLS 7" for L   TODAY'S TREATMENT:  [facility] Adult PT Treatment:                                                DATE: 06/05/22 Therapeutic  Exercise: Nustep L5 UE/LE x 5 minutes  Supine SLR 10 x 2  Supine ankle circles CW/CCW Seated Heel raise x15 Seated toe raise x 15   Modalities: [place] to left lateral lower leg- proximal and distal : 1.0 Continuous, 1.2 w/cm2 x 10 minutes  HMP left lateral leg x 8 minutes in sidelying  [facility] Adult PT Treatment:                                                DATE: 06/03/22 Therapeutic Exercise: Tandem 15 sec , pain 3/4 tandem 30 sec, pain Standing hip abduction 10 x 2 each , 2# (lateral left hip pain). AIREX pad rhomberg with trunk rotations Bilat heel raises Rocker board, wooden with UE 1 minute Gastroc stretch bilateral   Manual Therapy: IASTM along lateral lower leg following TPDN Trigger Point Dry Needling Treatment: Performed by Joellyn Rued, PT Pre-treatment instruction: Patient instructed on dry needling rationale, procedures, and possible side effects including pain during treatment (achy,cramping feeling), bruising, drop of blood, lightheadedness, nausea, sweating. Patient Consent Given: Yes Education handout provided: Yes Muscles treated: L peroneal longus and brevis  Needle size and number: .25x34mm x 2 Electrical stimulation performed: No Parameters: N/A Treatment response/outcome: Twitch response elicited Post-treatment instructions: Patient instructed to expect possible mild to moderate muscle soreness later today and/or tomorrow. Patient instructed in methods to reduce muscle soreness and to continue prescribed HEP. If patient was dry needled over the lung field, patient was instructed on signs and symptoms of pneumothorax and, however unlikely, to see immediate medical attention should they occur. Patient was also educated on signs and symptoms of infection and to seek medical attention should they occur. Patient verbalized understanding of these instructions and education.    PATIENT EDUCATION:  Education details: Eval findings, POC, HEP, self care  Person educated:  Patient Education method: Explanation, Demonstration, Tactile cues, Verbal cues, and Handouts Education comprehension: verbalized understanding, returned demonstration, verbal cues required, and tactile cues required   HOME EXERCISE PROGRAM: Access Code: [email or web address] URL: [email or web address] Date: 05/01/2022 Prepared by: Joellyn Rued  Exercises - Active Straight Leg Raise with Quad Set  - 1 x daily - 7 x weekly - 3 sets - 10 reps - 3 hold - Standing Hip Abduction with Counter Support  - 1 x daily - 7 x weekly - 3 sets - 10 reps - 3 hold - Standing Tandem Balance with Counter Support  - 1 x daily - 7 x weekly - 1 sets - 10 reps - 30-60 hold - Clamshell with Resistance  - 1 x daily - 7 x weekly - 3 sets - 10 reps - 3 hold - Seated Ankle Eversion with Resistance  - 1 x daily - 7 x weekly - 3 sets - 10 reps - 3 hold Added 05/09/22 - Seated Gastroc Stretch with Strap  - 1 x daily - 7 x weekly -  1 sets - 3 reps - 30 hold - Seated Soleus Stretch with Strap  - 1 x daily - 7 x weekly - 1 sets - 3 reps - 30 hold - Supine Ankle Circles  - 1 x daily - 7 x weekly - 1 sets - 3 reps - 30 hold 05/15/22:   Gastroc Stretch on Wall  - 1 x daily - 7 x weekly - 1 sets - 3 reps - 30 hold 05/20/22 - Seated Ankle Inversion with Resistance  - 1 x daily - 7 x weekly - 2-3 sets - 10 reps - Seated Ankle Dorsiflexion with Resistance  - 1 x daily - 7 x weekly - 2-3 sets - 10 reps 05/27/22 - Heel Raises with Counter Support  - 1 x daily - 7 x weekly - 2-3 sets - 10 reps   ASSESSMENT:   CLINICAL IMPRESSION: Reassessed several functional activities 5xSTS, SLS, and FOTO which were all improved and with the goal of 5xSTS being met. Subjective report indicates overall improvement with improved tolerance to functional activities. Pain levels can still be significant, but less frequent. PT was completed for L ankle/hip strengthening and stability. Pt finds the L ankle brace helps to manage pain with walking his  dogs. Pt tolerated PT today without adverse effects. Pt has 1 more scheduled visit. Will assess course of care at that time.   OBJECTIVE IMPAIRMENTS: Abnormal gait, decreased activity tolerance, difficulty walking, decreased strength, obesity, and pain.    ACTIVITY LIMITATIONS: carrying, lifting, bending, sitting, standing, squatting, sleeping, stairs, and locomotion level   PARTICIPATION LIMITATIONS: meal prep, cleaning, and laundry   PERSONAL FACTORS: High BMI, Hx of R TKA are also affecting patient's functional outcome.    REHAB POTENTIAL: Good   CLINICAL DECISION MAKING: Stable/uncomplicated   EVALUATION COMPLEXITY: Low     GOALS:   SHORT TERM GOALS: Target date: 05/16/22 Pt will be Ind in an initial HEP  Baseline: Initiated Goal status: MET   2.  Pt will voice understanding of measures to assist in pain reduction  Baseline: initaited Goal status: MET   LONG TERM GOALS: Target date: 06/13/22   Pt will be Ind in a final HEP to maintain achieved LOF  Baseline: initiated Goal status: INITIAL   2.  Increase L hip and ankle strength to 4/5 to improve gait and decrease L ankle pain Baseline: see flow sheets 05/29/22: improving ankle strength Goal status: ONGOING   3.  Pt will report a decrease in L ankle pain to 0-4/10 for improved function and QOL Baseline: 3-10/10 05/29/22: has more good days now Goal status: ONGOING   4.  Improve 5xSTS by MCID of 5" and by MCID of 41ft as indication of improved functional mobility and quality of gait with reduce antalgic/trendelenberg gait Baseline: see functional tests Status: 06/10/22; 5xSTS=10.6" Goal status: ONGOING   5.  Pt's FOTO score will improved to the predicted value of 70% as indication of improved function  Baseline: 54% 05/20/22: 45%; 06/10/22=62% Goal status: Improved       PLAN:   PT FREQUENCY: 2x/week   PT DURATION: 6 weeks   PLANNED INTERVENTIONS: Therapeutic exercises, Therapeutic activity, Neuromuscular  re-education, Balance training, Gait training, Patient/Family education, Self Care, Joint mobilization, Stair training, Aquatic Therapy, Dry Needling, Electrical stimulation, Cryotherapy, Moist heat, Taping, Vasopneumatic device, Ultrasound, Ionotophoresis /ml Dexamethasone, Manual therapy, and Re-evaluation   PLAN FOR NEXT SESSION: assess response to HEP; progress therex as indicated; use of modalities, manual therapy; repeat TPDN, RE-eval  next week - NEXT MD appt 4/16  Joellyn Rued MS, PT 06/10/22 10:15 AM

## 2022-06-11 ENCOUNTER — Encounter: Payer: Self-pay | Admitting: Family Medicine

## 2022-06-11 ENCOUNTER — Other Ambulatory Visit: Payer: Self-pay | Admitting: Family Medicine

## 2022-06-11 DIAGNOSIS — Z0189 Encounter for other specified special examinations: Secondary | ICD-10-CM

## 2022-06-11 DIAGNOSIS — G8929 Other chronic pain: Secondary | ICD-10-CM

## 2022-06-12 ENCOUNTER — Ambulatory Visit: Payer: PPO

## 2022-06-12 DIAGNOSIS — M25572 Pain in left ankle and joints of left foot: Secondary | ICD-10-CM | POA: Diagnosis not present

## 2022-06-12 DIAGNOSIS — R262 Difficulty in walking, not elsewhere classified: Secondary | ICD-10-CM

## 2022-06-12 DIAGNOSIS — M6281 Muscle weakness (generalized): Secondary | ICD-10-CM

## 2022-06-15 ENCOUNTER — Ambulatory Visit (INDEPENDENT_AMBULATORY_CARE_PROVIDER_SITE_OTHER): Payer: PPO

## 2022-06-15 DIAGNOSIS — G8929 Other chronic pain: Secondary | ICD-10-CM

## 2022-06-15 DIAGNOSIS — M25572 Pain in left ankle and joints of left foot: Secondary | ICD-10-CM | POA: Diagnosis not present

## 2022-06-15 DIAGNOSIS — Z0189 Encounter for other specified special examinations: Secondary | ICD-10-CM

## 2022-06-15 DIAGNOSIS — M7662 Achilles tendinitis, left leg: Secondary | ICD-10-CM | POA: Diagnosis not present

## 2022-06-16 NOTE — Progress Notes (Signed)
Lumbar spine x-ray shows multilevel arthritis changes in the lumbar spine.

## 2022-06-20 NOTE — Progress Notes (Signed)
MRI shows a partial-thickness tear of the tendon on the outside part of the ankle.  This is probably the source of your ankle pain.  Recommend return to clinic to talk about the results in full detail.  Continue home exercise program and physical therapy exercises for now.

## 2022-06-23 NOTE — Progress Notes (Unsigned)
Scott Payor, PhD, LAT, ATC acting as a scribe for Scott Graham, MD.  Scott [name] is a 67 y.o. male who presents to [facility] at [facility] today for f/u chronic L ankle pain. Pt was last seen by Dr. Denyse Amass on 06/10/22 and was advised to proceed to ankle MRI. Pt cont'd PT, completing 13 visits (d/c on 4/18). Today, pt reports L ankle pain cont and is uncomfortably painful.  Pain is worse at the lateral ankle.  Dx imaging: 06/15/22 L ankle MRI 04/07/2022 L ankle x-ray             04/06/14 L foot XR  Pertinent review of systems: No fevers or chills  Relevant historical information: Heart disease.  Not on nitrates   Exam:  BP 108/68   Pulse 67   Ht 5\' 6"  (1.676 m)   Wt 207 lb (93.9 kg)   SpO2 97%   BMI 33.41 kg/m  General: Well Developed, well nourished, and in no acute distress.   MSK: Ankle tender palpation at lateral malleolus.  Pain with resisted foot eversion.    Lab and Radiology Results  EXAM: MRI OF THE LEFT ANKLE WITHOUT CONTRAST   TECHNIQUE: Multiplanar, multisequence MR imaging of the ankle was performed. No intravenous contrast was administered.   COMPARISON:  Left ankle radiographs 04/07/2022   FINDINGS: TENDONS   Peroneal: There is a "chevron configuration" of the peroneus brevis tendon just distal to the fibula indicating a partial-thickness longitudinal tear that involves an approximate 1 cm length of the tendon (axial series 3 images 21 through 23). Distal to this, there is linear intermediate T2 signal within the midsubstance of the tendon indicating an interstitial tear (axial series 3 images 24 and 25). There is moderate attenuation of the rest of the peroneus brevis tendon to its insertion on the base of the fifth metatarsal. The peroneus longus tendon is intact.   Posteromedial: Intact tibialis posterior, flexor digitorum longus, and flexor hallucis longus tendons. There is mild edema within the visualized distal  flexor hallucis longus muscle.   Anterior: The tibialis anterior, extensor hallucis longus, and extensor digitorum longus tendons are intact.   Achilles: Intact.   Plantar Fascia: There is trace intermediate T2 signal at the junction of the medial band of the plantar fascia and the calcaneal origin (coronal series 5, image 12 and sagittal series 7, image 13). No fluid bright endovascular tear. No retraction.   LIGAMENTS   Lateral: The anterior and posterior talofibular, anterior and posterior tibiofibular, and calcaneofibular ligaments are intact.   Medial: The tibiotalar deep deltoid and tibial spring ligaments are intact.   CARTILAGE   Ankle Joint: Intact cartilage.   Subtalar Joints/Sinus Tarsi: The subtalar cartilage is intact.Fat is preserved within the sinus tarsi.   Bones: There is mild cartilage thinning and peripheral osteophytosis of the dorsal talonavicular joint.   Other: The tarsal tunnel is unremarkable. The Lisfranc ligament complex is intact.   IMPRESSION: 1. Partial-thickness longitudinal tear of the peroneus brevis tendon just distal to the fibula. There is also mild interstitial tearing and moderate attenuation of the more distal peroneus brevis tendon to its insertion on the base of the fifth metatarsal 2. Mild edema within the visualized distal flexor hallucis longus muscle. 3. Trace intermediate T2 signal at the junction of the medial band of the plantar fascia and the calcaneal origin. This may represent minimal plantar fasciitis. No fluid bright plantar fascia tear or retraction.  Electronically Signed   By: Neita Garnet M.D.   On: 06/19/2022 17:00 I, Scott [name], personally (independently) visualized and performed the interpretation of the images attached in this note.     Assessment and Plan: 67 y.o. male with left lateral ankle pain due to partial tear of the peroneal brevis tendon.  This is confirmed on MRI.  This is his dominant  issue for pain.  We discussed further options.  He is already done a fair amount of conservative management including physical therapy. Next step would be nitroglycerin patch protocol, potentially PRP injection, and possibly steroid injection.  All of these options have some compromises that make them not ideal. On discussing the possibility of nitroglycerin with his cardiologist.  I think it would be safe but he would like me to talk to his cardiologist and I think it is a good idea too.  Could do PRP in the future but he would like to avoid injections and the expense.  He is willing to consider it if needed.  Steroid injection is a possibility but could weaken the tendon and cause it to fail.  He would like to avoid that as well.  After discussion plan for continued home exercise program and giving it a month or 2 and seeing how it does.  If just not better we have all those options as above.   Total encounter time 30 minutes including face-to-face time with the patient and, reviewing past medical record, and charting on the date of service.     Discussed warning signs or symptoms. Please see discharge instructions. Patient expresses understanding.   The above documentation has been reviewed and is accurate and complete Scott [name], M.D.

## 2022-06-24 ENCOUNTER — Ambulatory Visit (INDEPENDENT_AMBULATORY_CARE_PROVIDER_SITE_OTHER): Payer: PPO | Admitting: Family Medicine

## 2022-06-24 ENCOUNTER — Telehealth: Payer: Self-pay | Admitting: Family Medicine

## 2022-06-24 VITALS — BP 108/68 | HR 67 | Ht 66.0 in | Wt 207.0 lb

## 2022-06-24 DIAGNOSIS — S96912D Strain of unspecified muscle and tendon at ankle and foot level, left foot, subsequent encounter: Secondary | ICD-10-CM

## 2022-06-24 NOTE — Patient Instructions (Addendum)
Thank you for coming in today.   Lets give it a few months.   Keep working on the exercises and the brace.   Next step could Nitroglycerine patches.   I could also do a PRP (platelet rich plasma) injection any time.   Let me know.

## 2022-06-24 NOTE — Telephone Encounter (Signed)
Your cardiologist is okay if we use nitroglycerin patches.  Let me know if you would like to start them in the near future.

## 2022-06-25 NOTE — Telephone Encounter (Signed)
Called pt and advised per Dr. Denyse Amass. Pt verbalized understanding and will call the office back when he is ready for rx to be sent in.

## 2022-06-30 ENCOUNTER — Other Ambulatory Visit: Payer: Self-pay | Admitting: Internal Medicine

## 2022-07-02 ENCOUNTER — Telehealth: Payer: Self-pay | Admitting: Internal Medicine

## 2022-07-02 MED ORDER — SPIRONOLACTONE 25 MG PO TABS: ORAL_TABLET | ORAL | 2 refills | Status: DC

## 2022-07-02 NOTE — Telephone Encounter (Signed)
[facility] 

## 2022-07-02 NOTE — Telephone Encounter (Signed)
Prescription Request  07/02/2022  LOV: 04/07/2022  What is the name of the medication or equipment? spironolactone (ALDACTONE) 25 MG tablet   Have you contacted your pharmacy to request a refill? No   Which pharmacy would you like this sent to?     [id number] [place], [place] - [address] AT [facility] OF [address] [place] [address] Phone: [phone] Fax: [phone]   Patient notified that their request is being sent to the clinical staff for review and that they should receive a response within 2 business days.   Please advise at Mobile [phone] (mobile)

## 2022-08-18 ENCOUNTER — Ambulatory Visit: Payer: PPO | Attending: Cardiology | Admitting: Cardiology

## 2022-08-18 ENCOUNTER — Telehealth: Payer: Self-pay | Admitting: *Deleted

## 2022-08-18 ENCOUNTER — Other Ambulatory Visit: Payer: Self-pay | Admitting: Internal Medicine

## 2022-08-18 ENCOUNTER — Encounter: Payer: Self-pay | Admitting: Cardiology

## 2022-08-18 VITALS — BP 118/58 | HR 65 | Ht 66.0 in | Wt 214.6 lb

## 2022-08-18 DIAGNOSIS — G4733 Obstructive sleep apnea (adult) (pediatric): Secondary | ICD-10-CM

## 2022-08-18 DIAGNOSIS — I5042 Chronic combined systolic (congestive) and diastolic (congestive) heart failure: Secondary | ICD-10-CM

## 2022-08-18 DIAGNOSIS — I10 Essential (primary) hypertension: Secondary | ICD-10-CM

## 2022-08-18 NOTE — Telephone Encounter (Signed)
New mask and supplies order sent to [facility]  DME selection is [facility] Patient understands he will be contacted by [facility] to set up his cpap. Patient understands to call if [facility] does not contact him with new setup in a timely manner. Patient understands they will be called once confirmation has been received from [facility] that they have received their new machine to schedule 10 week follow up appointment.   [facility] notified of new cpap order  Please add to airview Patient was grateful for the call and thanked me.

## 2022-08-18 NOTE — Patient Instructions (Signed)
Medication Instructions:  Your physician recommends that you continue on your current medications as directed. Please refer to the Current Medication list given to you today.  *If you need a refill on your cardiac medications before your next appointment, please call your pharmacy*   Lab Work: None.  If you have labs (blood work) drawn today and your tests are completely normal, you will receive your results only by: MyChart Message (if you have MyChart) OR A paper copy in the mail If you have any lab test that is abnormal or we need to change your treatment, we will call you to review the results.   Testing/Procedures: None.   Follow-Up: At [facility], you and your health needs are our priority.  As part of our continuing mission to provide you with exceptional heart care, we have created designated Provider Care Teams.  These Care Teams include your primary Cardiologist (physician) and Advanced Practice Providers (APPs -  Physician Assistants and Nurse Practitioners) who all work together to provide you with the care you need, when you need it.  We recommend signing up for the patient portal called "MyChart".  Sign up information is provided on this After Visit Summary.  MyChart is used to connect with patients for Virtual Visits (Telemedicine).  Patients are able to view lab/test results, encounter notes, upcoming appointments, etc.  Non-urgent messages can be sent to your provider as well.   To learn more about what you can do with MyChart, go to [email or web address].    Your next appointment:   1 year(s)  Provider:   Dr. Armanda Magic, MD   Other Instructions: Dr. Mayford Knife has ordered new cpap supplies for you. Our sleep coordinator will contact you regarding these supplies and which DME company will be providing them.

## 2022-08-18 NOTE — Progress Notes (Signed)
Date:  08/18/2022   ID:  Scott [name], DOB 12-14-1955, MRN [id number]   PCP:  Corwin Levins, MD  Cardiologist:  Lewayne Bunting, MD Electrophysiologist:  None   Chief Complaint:  OSA  History of Present Illness:    Scott [name] is a 67 y.o. male  with a hx of PAF, CHF and CAD who was referred for sleep study due to afib.  He underwent Sleep study showing severe OSA with an AHI of 74/hr and O2 sats as low as 83% with loud snoring.  He underwent CPAP titration to 9cm H2O.    He is doing well with his PAP device and thinks that he has gotten used to it.  He tolerates the full face mask and feels the pressure is adequate.  Since going on PAP he feels rested in the am and has no significant daytime sleepiness but does take a nap because he gets up very early to take his dogs for a walk.  He denies any significant mouth or nasal dryness or nasal congestion.  He does not think that he snores.  He currently does not have a DME because [facility] closed down and he needs new supplies.  Prior CV studies:   The following studies were reviewed today:  PAP compliance download  Past Medical History:  Diagnosis Date   ALLERGIC RHINITIS 01/14/2007   Allergy    ANXIETY 09/28/2006   Asbestos exposure    Atrial fibrillation (HCC) 09/28/2006   s/p afib ablation x 2   CHF (congestive heart failure) (HCC)    CKD (chronic kidney disease), stage III (HCC)    patient denies   COPD (chronic obstructive pulmonary disease) (HCC)    Coronary artery disease 11/24/2011   Diastolic dysfunction 09/28/2006   Dyspnea    W/ EXERTION    Dysrhythmia    AFIB      First degree AV block 10/12/2018   Noted on EKG   Gallstones    Hyperglycemia    HYPERLIPIDEMIA 09/28/2006   HYPERTENSION 09/28/2006   HYPOTHYROIDISM, ACQUIRED NEC 09/28/2006   pt denies and doesn't know   Long term current use of anticoagulant 04/08/2010   Obese    OBSTRUCTIVE SLEEP APNEA 01/06/2008   NOT USING     Right knee DJD 07/09/2010    S/P CABG x 1 11/26/2011   Right internal mammary artery to right coronary artery   S/P Maze operation for atrial fibrillation 11/26/2011   complete biatrial lesion set with clipping of LA appendage   Sleep apnea    Past Surgical History:  Procedure Laterality Date   ANTERIOR CERVICAL DECOMP/DISCECTOMY FUSION N/A 06/20/2016   Procedure: Anterior Cervical Discectomy Fusion - Cervical five-Cervical six - Cervical six-Cervical seven - Cervicla seven- Thoracic one;  Surgeon: Julio Sicks, MD;  Location: [facility] OR;  Service: Neurosurgery;  Laterality: N/A;   CARDIOVERSION     X4    CORONARY ARTERY BYPASS GRAFT  11/26/2011   Procedure: CORONARY ARTERY BYPASS GRAFTING (CABG);  Surgeon: Purcell Nails, MD;  Location: [facility] OR;  Service: Open Heart Surgery;  Laterality: N/A;  coronary artery bypass on pump times one utilizing the right internal mammary artery, transesophageal echocardiogram    HAND SURGERY Left    KNEE ARTHROSCOPY Right 12/10/2021   Procedure: RIGH TKNEE ARTHROSCOPY WITH SCAR BAND EXCISION;  Surgeon: Marcene Corning, MD;  Location: [facility] ORS;  Service: Orthopedics;  Laterality: Right;   LEFT HEART CATHETERIZATION WITH CORONARY ANGIOGRAM N/A 11/24/2011  Procedure: LEFT HEART CATHETERIZATION WITH CORONARY ANGIOGRAM;  Surgeon: Peter M Swaziland, MD;  Location: [facility] CATH LAB;  Service: Cardiovascular;  Laterality: N/A;   LOOP RECORDER IMPLANT N/A 07/06/2012   Procedure: LOOP RECORDER IMPLANT;  Surgeon: [name], MD;  Location: [facility] CATH LAB;  Service: Cardiovascular;  Laterality: N/A;   MAZE  11/26/2011   Procedure: MAZE;  Surgeon: Purcell Nails, MD;  Location: [facility] OR;  Service: Open Heart Surgery;  Laterality: N/A;  Cryomaze    Radiofrequency Ablation for atrial fibrillation  12/30/07 and 06/05/09   afib ablation x 2 by JA   Radiofrequency ablation for atrial flutter  2005   CTI   TOTAL KNEE ARTHROPLASTY Right 04/05/2019   Procedure: RIGHT TOTAL KNEE ARTHROPLASTY;  Surgeon: Marcene Corning, MD;  Location:  [facility] ORS;  Service: Orthopedics;  Laterality: Right;     Current Meds  Medication Sig   acetaminophen (TYLENOL) 500 MG tablet Take 1,000 mg by mouth every 6 (six) hours as needed for mild pain.    albuterol (VENTOLIN HFA) 108 (90 Base) MCG/ACT inhaler TAKE 2 PUFFS BY MOUTH EVERY 6 HOURS AS NEEDED FOR WHEEZE OR SHORTNESS OF BREATH   allopurinol (ZYLOPRIM) 100 MG tablet Take 1 tablet (100 mg total) by mouth daily. To lower uric acid and prevent gout   ALPRAZolam (XANAX) 0.5 MG tablet TAKE 1 TABLET BY MOUTH TWICE A DAY AS NEEDED FOR ANXIETY   amLODipine (NORVASC) 5 MG tablet Take 1 tablet (5 mg total) by mouth daily.   apixaban (ELIQUIS) 5 MG TABS tablet Take 1 tablet (5 mg total) by mouth 2 (two) times daily.   cholecalciferol (VITAMIN D) 1000 units tablet Take 1,000 Units by mouth daily.   clotrimazole-betamethasone (LOTRISONE) cream Apply 1 Application topically daily.   co-enzyme Q-10 30 MG capsule Take 30 mg by mouth daily.   colchicine 0.6 MG tablet Take 1 tablet (0.6 mg total) by mouth daily as needed (gout pain).   cyclobenzaprine (FLEXERIL) 5 MG tablet Take 1 tablet (5 mg total) by mouth 3 (three) times daily as needed for muscle spasms.   docusate sodium (COLACE) 100 MG capsule Take 200 mg by mouth daily as needed for mild constipation.    Fluticasone-Umeclidin-Vilant (TRELEGY ELLIPTA) 100-62.5-25 MCG/ACT AEPB Inhale 1 puff into the lungs daily.   furosemide (LASIX) 20 MG tablet TAKE 1 TABLET BY MOUTH EVERY DAY AS NEEDED (Patient taking differently: Take 20 mg by mouth daily.)   gabapentin (NEURONTIN) 100 MG capsule TAKE 1 CAPSULE BY MOUTH THREE TIMES A DAY   hydrochlorothiazide (MICROZIDE) 12.5 MG capsule Take 1 capsule (12.5 mg total) by mouth daily.   lovastatin (MEVACOR) 40 MG tablet Take 2 tablets (80 mg total) by mouth at bedtime.   metoprolol succinate (TOPROL-XL) 50 MG 24 hr tablet TAKE 1 AND 1/2 TABLETS DAILY BY MOUTH   Multiple Vitamin (MULTIVITAMIN WITH MINERALS) TABS tablet  Take 1 tablet by mouth daily.    olmesartan (BENICAR) 40 MG tablet Take 1 tablet (40 mg total) by mouth daily.   spironolactone (ALDACTONE) 25 MG tablet TAKE 0.5 TABLETS BY MOUTH AT BEDTIME.   temazepam (RESTORIL) 30 MG capsule TAKE 1 CAPSULE (30 MG TOTAL) BY MOUTH AT BEDTIME AS NEEDED. FOR SLEEP   traMADol (ULTRAM) 50 MG tablet TAKE 1 TABLET BY MOUTH EVERY 6 HOURS AS NEEDED.     Allergies:   Zolpidem tartrate   Social History   Tobacco Use   Smoking status: Former    Packs/day: 1.50  Years: 26.00    Additional pack years: 0.00    Total pack years: 39.00    Types: Cigarettes    Quit date: 04/06/1995    Years since quitting: 27.3   Smokeless tobacco: Never  Vaping Use   Vaping Use: Never used  Substance Use Topics   Alcohol use: No    Alcohol/week: 0.0 standard drinks of alcohol   Drug use: No     Family Hx: The patient's family history includes Atrial fibrillation in his mother; Cancer in his paternal grandmother; Dementia in his father and paternal grandfather; Hypertension in his father; Stroke in his mother. There is no history of Colon cancer, Esophageal cancer, Prostate cancer, Rectal cancer, or Stomach cancer.  ROS:   Please see the history of present illness.     All other systems reviewed and are negative.   Labs/Other Tests and Data Reviewed:    Recent Labs: 04/07/2022: Hemoglobin 15.1; Platelets 215.0; TSH 3.15 04/17/2022: ALT 25; BUN 29; Creatinine, Ser 1.56; Magnesium 2.0; Potassium 4.4; Sodium 141   Recent Lipid Panel Lab Results  Component Value Date/Time   CHOL 137 04/07/2022 10:56 AM   TRIG 166.0 (H) 04/07/2022 10:56 AM   HDL 40.70 04/07/2022 10:56 AM   CHOLHDL 3 04/07/2022 10:56 AM   LDLCALC 63 04/07/2022 10:56 AM    Wt Readings from Last 3 Encounters:  08/18/22 214 lb 9.6 oz (97.3 kg)  06/24/22 207 lb (93.9 kg)  06/10/22 210 lb (95.3 kg)     Objective:    Vital Signs:  BP (!) 118/58   Pulse 65   Ht 5\' 6"  (1.676 m)   Wt 214 lb 9.6 oz  (97.3 kg)   SpO2 97%   BMI 34.64 kg/m    GEN: Well nourished, well developed in no acute distress HEENT: Normal NECK: No JVD; No carotid bruits LYMPHATICS: No lymphadenopathy CARDIAC:RRR, no murmurs, rubs, gallops RESPIRATORY:  Clear to auscultation without rales, wheezing or rhonchi  ABDOMEN: Soft, non-tender, non-distended MUSCULOSKELETAL:  No edema; No deformity  SKIN: Warm and dry NEUROLOGIC:  Alert and oriented x 3 PSYCHIATRIC:  Normal affect  ASSESSMENT & PLAN:    1. OSA - The patient is tolerating PAP therapy well without any problems. The PAP download performed by his DME was personally reviewed and interpreted by me today and showed an AHI of 7 /hr on auto CPAP from 4-18 cm H2O with 80% compliance in using more than 4 hours nightly.  The patient has been using and benefiting from PAP use and will continue to benefit from therapy.  -He appears to have a mask leak and I encouraged him to change his cushion every 4 weeks to try to avoid mask seal issues -he has not had any supplies recently so I will set him up with a new DME   2 . HTN -BP is well-controlled on exam -Continue prescription drug management with Toprol-XL 50 mg daily, spironolactone 12.5 mg daily, hydrochlorothiazide 12.5 mg daily, olmesartan 40 mg daily, amlodipine 5 mg daily with as needed refil -I have personally reviewed and interpreted outside labs performed by patient's PCP which showed serum creatinine 1.56 and potassium 4.4 on 04/17/2022  Medication Adjustments/Labs and Tests Ordered: Current medicines are reviewed at length with the patient today.  Concerns regarding medicines are outlined above.  Tests Ordered: No orders of the defined types were placed in this encounter.  Medication Changes: No orders of the defined types were placed in this encounter.   Disposition:  Follow  up in 1 year(s)  Signed, Armanda Magic, MD  08/18/2022 2:33 PM     [facility]

## 2022-08-18 NOTE — Telephone Encounter (Signed)
-----   Message from Luellen Pucker, RN sent at 08/18/2022  2:38 PM EDT ----- Regarding: new DME Hello, Per Dr. Mayford Knife, patient used to have [facility] but they never set him up with a new DME company. He has not had a new mask or supplies in a year and needs them ordered now.  Whoever you end up setting him up with, Dr. Mayford Knife would like you to give him a call and let him know which company it is so he has their contact info.  Thanks, Alcario Drought

## 2022-10-01 ENCOUNTER — Telehealth: Payer: Self-pay | Admitting: Cardiology

## 2022-10-01 NOTE — Telephone Encounter (Signed)
Patient is calling to follow up on his CPAP machine supplies. Patient stated they are out of supplies. Please advise.

## 2022-10-01 NOTE — Telephone Encounter (Signed)
Patient stated that he called [facility] and they had stated that he was not in their system. Please advise.

## 2022-10-06 ENCOUNTER — Encounter: Payer: Self-pay | Admitting: Internal Medicine

## 2022-10-06 ENCOUNTER — Other Ambulatory Visit: Payer: Self-pay

## 2022-10-06 ENCOUNTER — Ambulatory Visit (INDEPENDENT_AMBULATORY_CARE_PROVIDER_SITE_OTHER): Payer: PPO | Admitting: Internal Medicine

## 2022-10-06 ENCOUNTER — Other Ambulatory Visit: Payer: Self-pay | Admitting: Internal Medicine

## 2022-10-06 VITALS — BP 126/74 | HR 45 | Temp 98.2°F | Ht 66.0 in | Wt 214.0 lb

## 2022-10-06 DIAGNOSIS — E039 Hypothyroidism, unspecified: Secondary | ICD-10-CM

## 2022-10-06 DIAGNOSIS — R739 Hyperglycemia, unspecified: Secondary | ICD-10-CM | POA: Diagnosis not present

## 2022-10-06 DIAGNOSIS — E7800 Pure hypercholesterolemia, unspecified: Secondary | ICD-10-CM

## 2022-10-06 DIAGNOSIS — N1831 Chronic kidney disease, stage 3a: Secondary | ICD-10-CM

## 2022-10-06 DIAGNOSIS — M25431 Effusion, right wrist: Secondary | ICD-10-CM | POA: Diagnosis not present

## 2022-10-06 DIAGNOSIS — M25531 Pain in right wrist: Secondary | ICD-10-CM

## 2022-10-06 DIAGNOSIS — E538 Deficiency of other specified B group vitamins: Secondary | ICD-10-CM | POA: Diagnosis not present

## 2022-10-06 DIAGNOSIS — I10 Essential (primary) hypertension: Secondary | ICD-10-CM

## 2022-10-06 DIAGNOSIS — R001 Bradycardia, unspecified: Secondary | ICD-10-CM | POA: Insufficient documentation

## 2022-10-06 DIAGNOSIS — E559 Vitamin D deficiency, unspecified: Secondary | ICD-10-CM

## 2022-10-06 LAB — URINALYSIS, ROUTINE W REFLEX MICROSCOPIC
Bilirubin Urine: NEGATIVE
Hgb urine dipstick: NEGATIVE
Ketones, ur: NEGATIVE
Leukocytes,Ua: NEGATIVE
Nitrite: NEGATIVE
Specific Gravity, Urine: 1.025 (ref 1.000–1.030)
Total Protein, Urine: NEGATIVE
Urine Glucose: NEGATIVE
Urobilinogen, UA: 0.2 (ref 0.0–1.0)
pH: 6 (ref 5.0–8.0)

## 2022-10-06 LAB — BASIC METABOLIC PANEL
BUN: 23 mg/dL (ref 6–23)
CO2: 28 mEq/L (ref 19–32)
Calcium: 9.3 mg/dL (ref 8.4–10.5)
Chloride: 100 mEq/L (ref 96–112)
Creatinine, Ser: 1.61 mg/dL — ABNORMAL HIGH (ref 0.40–1.50)
GFR: 43.98 mL/min — ABNORMAL LOW (ref >60.00)
Glucose, Bld: 99 mg/dL (ref 70–99)
Potassium: 4 mEq/L (ref 3.5–5.1)
Sodium: 135 mEq/L (ref 135–145)

## 2022-10-06 LAB — HEMOGLOBIN A1C: Hgb A1c MFr Bld: 5.5 % (ref 4.6–6.5)

## 2022-10-06 LAB — LIPID PANEL
Cholesterol: 138 mg/dL (ref 0–200)
HDL: 38.7 mg/dL — ABNORMAL LOW (ref >39.00)
LDL Cholesterol: 76 mg/dL (ref 0–99)
NonHDL: 99.45
Total CHOL/HDL Ratio: 4
Triglycerides: 119 mg/dL (ref 0.0–149.0)
VLDL: 23.8 mg/dL (ref 0.0–40.0)

## 2022-10-06 LAB — CBC WITH DIFFERENTIAL/PLATELET
Basophils Absolute: 0.1 10*3/uL (ref 0.0–0.1)
Basophils Relative: 0.8 % (ref 0.0–3.0)
Eosinophils Absolute: 0.5 10*3/uL (ref 0.0–0.7)
Eosinophils Relative: 5.8 % — ABNORMAL HIGH (ref 0.0–5.0)
HCT: 42.7 % (ref 39.0–52.0)
Hemoglobin: 14.2 g/dL (ref 13.0–17.0)
Lymphocytes Relative: 17 % (ref 12.0–46.0)
Lymphs Abs: 1.4 10*3/uL (ref 0.7–4.0)
MCHC: 33.3 g/dL (ref 30.0–36.0)
MCV: 95.7 fl (ref 78.0–100.0)
Monocytes Absolute: 0.8 10*3/uL (ref 0.1–1.0)
Monocytes Relative: 9.5 % (ref 3.0–12.0)
Neutro Abs: 5.6 10*3/uL (ref 1.4–7.7)
Neutrophils Relative %: 66.9 % (ref 43.0–77.0)
Platelets: 197 10*3/uL (ref 150.0–400.0)
RBC: 4.46 Mil/uL (ref 4.22–5.81)
RDW: 13.6 % (ref 11.5–15.5)
WBC: 8.4 10*3/uL (ref 4.0–10.5)

## 2022-10-06 LAB — HEPATIC FUNCTION PANEL
ALT: 24 U/L (ref 0–53)
AST: 23 U/L (ref 0–37)
Albumin: 4.1 g/dL (ref 3.5–5.2)
Alkaline Phosphatase: 55 U/L (ref 39–117)
Bilirubin, Direct: 0.1 mg/dL (ref 0.0–0.3)
Total Bilirubin: 0.6 mg/dL (ref 0.2–1.2)
Total Protein: 7 g/dL (ref 6.0–8.3)

## 2022-10-06 LAB — VITAMIN B12: Vitamin B-12: 308 pg/mL (ref 211–911)

## 2022-10-06 LAB — VITAMIN D 25 HYDROXY (VIT D DEFICIENCY, FRACTURES): VITD: 45.83 ng/mL (ref 30.00–100.00)

## 2022-10-06 LAB — T4, FREE: Free T4: 0.76 ng/dL (ref 0.60–1.60)

## 2022-10-06 LAB — TSH: TSH: 3.71 u[IU]/mL (ref 0.35–5.50)

## 2022-10-06 MED ORDER — METOPROLOL SUCCINATE ER 50 MG PO TB24: ORAL_TABLET | ORAL | 3 refills | Status: DC

## 2022-10-06 NOTE — Patient Instructions (Addendum)
Your ECG was done today and confirmed the low heart rate, which is too low even though you tolerate it well  Ok to decrease the toprol xl to 50 mg per day only (one pill only per day)  Please continue all other medications as before, and refills have been done if requested.  Please have the pharmacy call with any other refills you may need.  Please keep your appointments with your specialists as you may have planned  Please go to the XRAY Department in the first floor for the x-ray testing  Please go to the LAB at the blood drawing area for the tests to be done  You will be contacted by phone if any changes need to be made immediately.  Otherwise, you will receive a letter about your results with an explanation, but please check with MyChart first.  Please remember to sign up for MyChart if you have not done so, as this will be important to you in the future with finding out test results, communicating by private email, and scheduling acute appointments online when needed.  Please make an Appointment to return in 6 months, or sooner if needed

## 2022-10-06 NOTE — Progress Notes (Unsigned)
Patient ID: Scott [name], male   DOB: 01-12-1956, 67 y.o.   MRN: [id number]        Chief Complaint: follow up with bradycardia, ckd3a, low thyroid, hld, htn, hyperglycemia       HPI:  Scott [name] is a 67 y.o. male here overall doing ok, Pt denies chest pain, increased sob or doe, wheezing, orthopnea, PND, increased LE swelling, palpitations, dizziness or syncope.   Pt denies polydipsia, polyuria, or new focal neuro s/s.    Pt denies fever, wt loss, night sweats, loss of appetite, or other constitutional symptoms  Denies hyper or hypo thyroid symptoms such as voice, skin or hair change.  Also has right wrist swelling without trauma but has been using the hand more recently.         Wt Readings from Last 3 Encounters:  10/06/22 214 lb (97.1 kg)  08/18/22 214 lb 9.6 oz (97.3 kg)  06/24/22 207 lb (93.9 kg)   BP Readings from Last 3 Encounters:  10/06/22 126/74  08/18/22 (!) 118/58  06/24/22 108/68         Past Medical History:  Diagnosis Date   ALLERGIC RHINITIS 01/14/2007   Allergy    ANXIETY 09/28/2006   Asbestos exposure    Atrial fibrillation (HCC) 09/28/2006   s/p afib ablation x 2   CHF (congestive heart failure) (HCC)    CKD (chronic kidney disease), stage III (HCC)    patient denies   COPD (chronic obstructive pulmonary disease) (HCC)    Coronary artery disease 11/24/2011   Diastolic dysfunction 09/28/2006   Dyspnea    W/ EXERTION    Dysrhythmia    AFIB      First degree AV block 10/12/2018   Noted on EKG   Gallstones    Hyperglycemia    HYPERLIPIDEMIA 09/28/2006   HYPERTENSION 09/28/2006   HYPOTHYROIDISM, ACQUIRED NEC 09/28/2006   pt denies and doesn't know   Long term current use of anticoagulant 04/08/2010   Obese    OBSTRUCTIVE SLEEP APNEA 01/06/2008   NOT USING     Right knee DJD 07/09/2010   S/P CABG x 1 11/26/2011   Right internal mammary artery to right coronary artery   S/P Maze operation for atrial fibrillation 11/26/2011   complete biatrial lesion set  with clipping of LA appendage   Sleep apnea    Past Surgical History:  Procedure Laterality Date   ANTERIOR CERVICAL DECOMP/DISCECTOMY FUSION N/A 06/20/2016   Procedure: Anterior Cervical Discectomy Fusion - Cervical five-Cervical six - Cervical six-Cervical seven - Cervicla seven- Thoracic one;  Surgeon: Julio Sicks, MD;  Location: [facility] OR;  Service: Neurosurgery;  Laterality: N/A;   CARDIOVERSION     X4    CORONARY ARTERY BYPASS GRAFT  11/26/2011   Procedure: CORONARY ARTERY BYPASS GRAFTING (CABG);  Surgeon: Purcell Nails, MD;  Location: [facility] OR;  Service: Open Heart Surgery;  Laterality: N/A;  coronary artery bypass on pump times one utilizing the right internal mammary artery, transesophageal echocardiogram    HAND SURGERY Left    KNEE ARTHROSCOPY Right 12/10/2021   Procedure: RIGH TKNEE ARTHROSCOPY WITH SCAR BAND EXCISION;  Surgeon: Marcene Corning, MD;  Location: [facility] ORS;  Service: Orthopedics;  Laterality: Right;   LEFT HEART CATHETERIZATION WITH CORONARY ANGIOGRAM N/A 11/24/2011   Procedure: LEFT HEART CATHETERIZATION WITH CORONARY ANGIOGRAM;  Surgeon: Peter M Swaziland, MD;  Location: [facility] CATH LAB;  Service: Cardiovascular;  Laterality: N/A;   LOOP RECORDER IMPLANT N/A 07/06/2012  Procedure: LOOP RECORDER IMPLANT;  Surgeon: [name], MD;  Location: [facility] CATH LAB;  Service: Cardiovascular;  Laterality: N/A;   MAZE  11/26/2011   Procedure: MAZE;  Surgeon: Purcell Nails, MD;  Location: [facility] OR;  Service: Open Heart Surgery;  Laterality: N/A;  Cryomaze    Radiofrequency Ablation for atrial fibrillation  12/30/07 and 06/05/09   afib ablation x 2 by JA   Radiofrequency ablation for atrial flutter  2005   CTI   TOTAL KNEE ARTHROPLASTY Right 04/05/2019   Procedure: RIGHT TOTAL KNEE ARTHROPLASTY;  Surgeon: Marcene Corning, MD;  Location: [facility] ORS;  Service: Orthopedics;  Laterality: Right;    reports that he quit smoking about 27 years ago. His smoking use included cigarettes. He started smoking about 53  years ago. He has a 39 pack-year smoking history. He has never used smokeless tobacco. He reports that he does not drink alcohol and does not use drugs. family history includes Atrial fibrillation in his mother; Cancer in his paternal grandmother; Dementia in his father and paternal grandfather; Hypertension in his father; Stroke in his mother. Allergies  Allergen Reactions   Zolpidem Tartrate Other (See Comments)    Extremely drowsy the next day   Current Outpatient Medications on File Prior to Visit  Medication Sig Dispense Refill   acetaminophen (TYLENOL) 500 MG tablet Take 1,000 mg by mouth every 6 (six) hours as needed for mild pain.      albuterol (VENTOLIN HFA) 108 (90 Base) MCG/ACT inhaler TAKE 2 PUFFS BY MOUTH EVERY 6 HOURS AS NEEDED FOR WHEEZE OR SHORTNESS OF BREATH 8.5 each 5   allopurinol (ZYLOPRIM) 100 MG tablet Take 1 tablet (100 mg total) by mouth daily. To lower uric acid and prevent gout 90 tablet 1   ALPRAZolam (XANAX) 0.5 MG tablet TAKE 1 TABLET BY MOUTH TWICE A DAY AS NEEDED FOR ANXIETY 60 tablet 2   apixaban (ELIQUIS) 5 MG TABS tablet Take 1 tablet (5 mg total) by mouth 2 (two) times daily. 180 tablet 3   cholecalciferol (VITAMIN D) 1000 units tablet Take 1,000 Units by mouth daily.     clotrimazole-betamethasone (LOTRISONE) cream Apply 1 Application topically daily. 30 g 1   co-enzyme Q-10 30 MG capsule Take 30 mg by mouth daily.     colchicine 0.6 MG tablet Take 1 tablet (0.6 mg total) by mouth daily as needed (gout pain). 30 tablet 2   cyclobenzaprine (FLEXERIL) 5 MG tablet Take 1 tablet (5 mg total) by mouth 3 (three) times daily as needed for muscle spasms. 40 tablet 1   docusate sodium (COLACE) 100 MG capsule Take 200 mg by mouth daily as needed for mild constipation.      Fluticasone-Umeclidin-Vilant (TRELEGY ELLIPTA) 100-62.5-25 MCG/ACT AEPB Inhale 1 puff into the lungs daily. 3 each 3   furosemide (LASIX) 20 MG tablet TAKE 1 TABLET BY MOUTH EVERY DAY AS NEEDED  (Patient taking differently: Take 20 mg by mouth daily.) 15 tablet 0   gabapentin (NEURONTIN) 100 MG capsule TAKE 1 CAPSULE BY MOUTH THREE TIMES A DAY 270 capsule 3   hydrochlorothiazide (MICROZIDE) 12.5 MG capsule Take 1 capsule (12.5 mg total) by mouth daily. 90 capsule 3   Multiple Vitamin (MULTIVITAMIN WITH MINERALS) TABS tablet Take 1 tablet by mouth daily.      olmesartan (BENICAR) 40 MG tablet Take 1 tablet (40 mg total) by mouth daily. 90 tablet 3   spironolactone (ALDACTONE) 25 MG tablet TAKE 0.5 TABLETS BY MOUTH AT BEDTIME. 45 tablet  2   temazepam (RESTORIL) 30 MG capsule TAKE 1 CAPSULE (30 MG TOTAL) BY MOUTH AT BEDTIME AS NEEDED. FOR SLEEP 30 capsule 2   traMADol (ULTRAM) 50 MG tablet TAKE 1 TABLET BY MOUTH EVERY 6 HOURS AS NEEDED. 120 tablet 5   amLODipine (NORVASC) 5 MG tablet Take 1 tablet (5 mg total) by mouth daily. 90 tablet 3   No current facility-administered medications on file prior to visit.        ROS:  All others reviewed and negative.  Objective        PE:  BP 126/74 (BP Location: Right Arm, Patient Position: Sitting, Cuff Size: Normal)   Pulse (!) 45   Temp 98.2 F (36.8 C) (Oral)   Ht 5\' 6"  (1.676 m)   Wt 214 lb (97.1 kg)   SpO2 96%   BMI 34.54 kg/m                 Constitutional: Pt appears in NAD               HENT: Head: NCAT.                Right Ear: External ear normal.                 Left Ear: External ear normal.                Eyes: . Pupils are equal, round, and reactive to light. Conjunctivae and EOM are normal               Nose: without d/c or deformity               Neck: Neck supple. Gross normal ROM               Cardiovascular: Normal rate and regular rhythm.                 Pulmonary/Chest: Effort normal and breath sounds without rales or wheezing.                Abd:  Soft, NT, ND, + BS, no organomegaly               Neurological: Pt is alert. At baseline orientation, motor grossly intact               Skin: Skin is warm. No rashes,  no other new lesions, LE edema - none               Psychiatric: Pt behavior is normal without agitation   Micro: none  Cardiac tracings I have personally interpreted today:  none  Pertinent Radiological findings (summarize): none   Lab Results  Component Value Date   WBC 8.4 10/06/2022   HGB 14.2 10/06/2022   HCT 42.7 10/06/2022   PLT 197.0 10/06/2022   GLUCOSE 99 10/06/2022   CHOL 138 10/06/2022   TRIG 119.0 10/06/2022   HDL 38.70 (L) 10/06/2022   LDLCALC 76 10/06/2022   ALT 24 10/06/2022   AST 23 10/06/2022   NA 135 10/06/2022   K 4.0 10/06/2022   CL 100 10/06/2022   CREATININE 1.61 (H) 10/06/2022   BUN 23 10/06/2022   CO2 28 10/06/2022   TSH 3.71 10/06/2022   PSA 1.02 04/07/2022   INR 1.3 (H) 01/01/2021   HGBA1C 5.5 10/06/2022   MICROALBUR <0.7 10/03/2021    Assessment/Plan:  Scott [name] is a 67 y.o. White or Caucasian [1] male with  has a past medical history of ALLERGIC RHINITIS (01/14/2007), Allergy, ANXIETY (09/28/2006), Asbestos exposure, Atrial fibrillation (HCC) (09/28/2006), CHF (congestive heart failure) (HCC), CKD (chronic kidney disease), stage III (HCC), COPD (chronic obstructive pulmonary disease) (HCC), Coronary artery disease (11/24/2011), Diastolic dysfunction (09/28/2006), Dyspnea, Dysrhythmia, First degree AV block (10/12/2018), Gallstones, Hyperglycemia, HYPERLIPIDEMIA (09/28/2006), HYPERTENSION (09/28/2006), HYPOTHYROIDISM, ACQUIRED NEC (09/28/2006), Long term current use of anticoagulant (04/08/2010), Obese, OBSTRUCTIVE SLEEP APNEA (01/06/2008), Right knee DJD (07/09/2010), S/P CABG x 1 (11/26/2011), S/P Maze operation for atrial fibrillation (11/26/2011), and Sleep apnea.  Pain and swelling of right wrist I suspec underlying mild djd, doubt gouty arthritis, for tylenol prn, avoid nsaids due to renal fxn, for xray plain film today to confirm, delcines hand surgury referral for now  Hypothyroidism Lab Results  Component Value Date   TSH 3.71 10/06/2022    Stable, pt to continue no current med tx   Hyperlipidemia Lab Results  Component Value Date   LDLCALC 76 10/06/2022   Uncontrolled, goal ldl < 70, pt to continue current statin lovastatin 80 every day, declines add zetia for now   Essential hypertension BP Readings from Last 3 Encounters:  10/06/22 126/74  08/18/22 (!) 118/58  06/24/22 108/68   Stable, pt to continue medical treatment hct 12.5 every day, benicar 40 every day, norvasc 5 every day, but decrease toprol xl 50 qd   Hyperglycemia Lab Results  Component Value Date   HGBA1C 5.5 10/06/2022   Stable, pt to continue current medical treatment  - diet, wt control   CKD (chronic kidney disease) stage 3, GFR 30-59 ml/min Lab Results  Component Value Date   CREATININE 1.61 (H) 10/06/2022   Stable overall, cont to avoid nephrotoxins   Bradycardia New finding, tolerating well but will need decrased toprol xl 50 qd  Followup: Return in about 6 months (around 04/08/2023).  Oliver Barre, MD 10/07/2022 5:03 PM [facility]

## 2022-10-06 NOTE — Assessment & Plan Note (Signed)
I suspec underlying mild djd, doubt gouty arthritis, for tylenol prn, avoid nsaids due to renal fxn, for xray plain film today to confirm, delcines hand surgury referral for now

## 2022-10-06 NOTE — Progress Notes (Signed)
The test results show that your current treatment is OK, as the tests are stable.  Please continue the same plan.  There is no other need for change of treatment or further evaluation based on these results, at this time.  thanks 

## 2022-10-07 ENCOUNTER — Encounter: Payer: Self-pay | Admitting: Internal Medicine

## 2022-10-07 NOTE — Assessment & Plan Note (Signed)
Lab Results  Component Value Date   HGBA1C 5.5 10/06/2022   Stable, pt to continue current medical treatment  - diet, wt control

## 2022-10-07 NOTE — Assessment & Plan Note (Signed)
New finding, tolerating well but will need decrased toprol xl 50 qd

## 2022-10-07 NOTE — Assessment & Plan Note (Signed)
Lab Results  Component Value Date   LDLCALC 76 10/06/2022   Uncontrolled, goal ldl < 70, pt to continue current statin lovastatin 80 every day, declines add zetia for now

## 2022-10-07 NOTE — Assessment & Plan Note (Signed)
BP Readings from Last 3 Encounters:  10/06/22 126/74  08/18/22 (!) 118/58  06/24/22 108/68   Stable, pt to continue medical treatment hct 12.5 every day, benicar 40 every day, norvasc 5 every day, but decrease toprol xl 50 qd

## 2022-10-07 NOTE — Assessment & Plan Note (Signed)
Lab Results  Component Value Date   CREATININE 1.61 (H) 10/06/2022   Stable overall, cont to avoid nephrotoxins

## 2022-10-07 NOTE — Assessment & Plan Note (Signed)
Lab Results  Component Value Date   TSH 3.71 10/06/2022   Stable, pt to continue no current med tx

## 2022-10-08 ENCOUNTER — Ambulatory Visit (INDEPENDENT_AMBULATORY_CARE_PROVIDER_SITE_OTHER): Payer: PPO

## 2022-10-08 DIAGNOSIS — M25531 Pain in right wrist: Secondary | ICD-10-CM

## 2022-10-08 DIAGNOSIS — M25431 Effusion, right wrist: Secondary | ICD-10-CM

## 2022-10-16 ENCOUNTER — Encounter: Payer: Self-pay | Admitting: Internal Medicine

## 2022-10-20 ENCOUNTER — Other Ambulatory Visit: Payer: Self-pay | Admitting: Internal Medicine

## 2022-10-21 ENCOUNTER — Other Ambulatory Visit: Payer: Self-pay

## 2022-10-23 NOTE — Telephone Encounter (Signed)
Notified by [facility], CPAP order has been received and is being processed. BR

## 2022-11-01 ENCOUNTER — Other Ambulatory Visit: Payer: Self-pay | Admitting: Internal Medicine

## 2022-11-03 ENCOUNTER — Other Ambulatory Visit: Payer: Self-pay

## 2022-11-18 ENCOUNTER — Other Ambulatory Visit: Payer: Self-pay | Admitting: Internal Medicine

## 2022-11-24 ENCOUNTER — Other Ambulatory Visit: Payer: Self-pay | Admitting: Internal Medicine

## 2023-01-19 ENCOUNTER — Other Ambulatory Visit: Payer: Self-pay | Admitting: Internal Medicine

## 2023-03-02 ENCOUNTER — Ambulatory Visit (INDEPENDENT_AMBULATORY_CARE_PROVIDER_SITE_OTHER): Payer: PPO

## 2023-03-02 VITALS — BP 110/70 | HR 76 | Ht 66.0 in | Wt 223.2 lb

## 2023-03-02 DIAGNOSIS — Z0000 Encounter for general adult medical examination without abnormal findings: Secondary | ICD-10-CM | POA: Diagnosis not present

## 2023-03-02 NOTE — Patient Instructions (Signed)
 [name] , Thank you for taking time to come for your Medicare Wellness Visit. I appreciate your ongoing commitment to your health goals. Please review the following plan we discussed and let me know if I can assist you in the future.   Referrals/Orders/Follow-Ups/Clinician Recommendations: It was nice meeting you today.  Keep up the good work.   This is a list of the screening recommended for you and due dates:  Health Maintenance  Topic Date Due   Medicare Annual Wellness Visit  03/01/2024   DTaP/Tdap/Td vaccine (3 - Tdap) 04/04/2024   Colon Cancer Screening  05/03/2025   Pneumonia Vaccine  Completed   Flu Shot  Completed   COVID-19 Vaccine  Completed   Hepatitis C Screening  Completed   Zoster (Shingles) Vaccine  Completed   HPV Vaccine  Aged Out    Advanced directives: (Declined) Advance directive discussed with you today. Even though you declined this today, please call our office should you change your mind, and we can give you the proper paperwork for you to fill out.  Next Medicare Annual Wellness Visit scheduled for next year: Yes

## 2023-03-02 NOTE — Progress Notes (Signed)
 Subjective:   Scott [name] is a 68 y.o. male who presents for Medicare Annual/Subsequent preventive examination.  Visit Complete: In person   Cardiac Risk Factors include: advanced age (>55men, >7 women);hypertension;male gender;Other (see comment);dyslipidemia, Risk factor comments: OSA, CAD, A-Fib, COPD, CKD     Objective:    Today's Vitals   03/02/23 1616  BP: 110/70  Pulse: 76  SpO2: 95%  Weight: 223 lb 3.2 oz (101.2 kg)  Height: 5' 6 (1.676 m)   Body mass index is 36.03 kg/m.     03/02/2023    3:43 PM 04/24/2022    1:40 PM 03/14/2022   10:57 AM 12/10/2021   12:20 PM 11/28/2021    8:41 AM 02/14/2021    9:10 AM 09/28/2020   10:00 AM  Advanced Directives  Does Patient Have a Medical Advance Directive? Yes Yes Yes Yes Yes Yes Yes  Type of Estate Agent of [place];Living will Healthcare Power of [place];Living will Healthcare Power of [place];Living will Healthcare Power of [place];Living will     Does patient want to make changes to medical advance directive? No - Patient declined No - Patient declined No - Patient declined No - Patient declined No - Patient declined No - Patient declined No - Patient declined  Copy of Healthcare Power of Attorney in Chart? Yes - validated most recent copy scanned in chart (See row information) No - copy requested Yes - validated most recent copy scanned in chart (See row information) No - copy requested     Would patient like information on creating a medical advance directive?  No - Patient declined         Current Medications (verified) Outpatient Encounter Medications as of 03/02/2023  Medication Sig   acetaminophen  (TYLENOL ) 500 MG tablet Take 1,000 mg by mouth every 6 (six) hours as needed for mild pain.    albuterol  (VENTOLIN  HFA) 108 (90 Base) MCG/ACT inhaler TAKE 2 PUFFS BY MOUTH EVERY 6 HOURS AS NEEDED FOR WHEEZE OR SHORTNESS OF BREATH   allopurinol  (ZYLOPRIM ) 100 MG tablet Take 1 tablet (100 mg  total) by mouth daily. To lower uric acid and prevent gout   ALPRAZolam  (XANAX ) 0.5 MG tablet TAKE 1 TABLET BY MOUTH TWICE A DAY AS NEEDED FOR ANXIETY   amLODipine  (NORVASC ) 5 MG tablet TAKE 1 TABLET (5 MG TOTAL) BY MOUTH DAILY.   cholecalciferol  (VITAMIN D ) 1000 units tablet Take 1,000 Units by mouth daily.   clotrimazole -betamethasone  (LOTRISONE ) cream Apply 1 Application topically daily.   co-enzyme Q-10 30 MG capsule Take 30 mg by mouth daily.   colchicine  0.6 MG tablet Take 1 tablet (0.6 mg total) by mouth daily as needed (gout pain).   cyclobenzaprine  (FLEXERIL ) 5 MG tablet Take 1 tablet (5 mg total) by mouth 3 (three) times daily as needed for muscle spasms.   docusate sodium  (COLACE) 100 MG capsule Take 200 mg by mouth daily as needed for mild constipation.    ELIQUIS  5 MG TABS tablet TAKE 1 TABLET BY MOUTH TWICE A DAY   Fluticasone -Umeclidin-Vilant (TRELEGY ELLIPTA ) 100-62.5-25 MCG/ACT AEPB Inhale 1 puff into the lungs daily.   furosemide  (LASIX ) 20 MG tablet TAKE 1 TABLET BY MOUTH EVERY DAY AS NEEDED (Patient taking differently: Take 20 mg by mouth daily.)   gabapentin  (NEURONTIN ) 100 MG capsule TAKE 1 CAPSULE BY MOUTH THREE TIMES A DAY   hydrochlorothiazide  (MICROZIDE ) 12.5 MG capsule TAKE 1 CAPSULE BY MOUTH EVERY DAY   lovastatin  (MEVACOR ) 40 MG tablet TAKE  2 TABLETS (80 MG TOTAL) BY MOUTH EVERY EVENING.   metoprolol  succinate (TOPROL -XL) 50 MG 24 hr tablet TAKE 1 TABLETS once DAILY BY MOUTH   Multiple Vitamin (MULTIVITAMIN WITH MINERALS) TABS tablet Take 1 tablet by mouth daily.    olmesartan  (BENICAR ) 40 MG tablet TAKE 1 TABLET BY MOUTH EVERY DAY   spironolactone  (ALDACTONE ) 25 MG tablet TAKE 0.5 TABLETS BY MOUTH AT BEDTIME.   temazepam  (RESTORIL ) 30 MG capsule TAKE 1 CAPSULE (30 MG TOTAL) BY MOUTH AT BEDTIME AS NEEDED. FOR SLEEP   traMADol  (ULTRAM ) 50 MG tablet TAKE 1 TABLET BY MOUTH EVERY 6 HOURS AS NEEDED   No facility-administered encounter medications on file as of 03/02/2023.     Allergies (verified) Zolpidem tartrate   History: Past Medical History:  Diagnosis Date   ALLERGIC RHINITIS 01/14/2007   Allergy    ANXIETY 09/28/2006   Asbestos exposure    Atrial fibrillation (HCC) 09/28/2006   s/p afib ablation x 2   CHF (congestive heart failure) (HCC)    CKD (chronic kidney disease), stage III (HCC)    patient denies   COPD (chronic obstructive pulmonary disease) (HCC)    Coronary artery disease 11/24/2011   Diastolic dysfunction 09/28/2006   Dyspnea    W/ EXERTION    Dysrhythmia    AFIB      First degree AV block 10/12/2018   Noted on EKG   Gallstones    Hyperglycemia    HYPERLIPIDEMIA 09/28/2006   HYPERTENSION 09/28/2006   HYPOTHYROIDISM, ACQUIRED NEC 09/28/2006   pt denies and doesn't know   Long term current use of anticoagulant 04/08/2010   Obese    OBSTRUCTIVE SLEEP APNEA 01/06/2008   NOT USING     Right knee DJD 07/09/2010   S/P CABG x 1 11/26/2011   Right internal mammary artery to right coronary artery   S/P Maze operation for atrial fibrillation 11/26/2011   complete biatrial lesion set with clipping of LA appendage   Sleep apnea    Past Surgical History:  Procedure Laterality Date   ANTERIOR CERVICAL DECOMP/DISCECTOMY FUSION N/A 06/20/2016   Procedure: Anterior Cervical Discectomy Fusion - Cervical five-Cervical six - Cervical six-Cervical seven - Cervicla seven- Thoracic one;  Surgeon: Victory Gunnels, MD;  Location: [facility] OR;  Service: Neurosurgery;  Laterality: N/A;   CARDIOVERSION     X4    CORONARY ARTERY BYPASS GRAFT  11/26/2011   Procedure: CORONARY ARTERY BYPASS GRAFTING (CABG);  Surgeon: Sudie VEAR Laine, MD;  Location: [facility] OR;  Service: Open Heart Surgery;  Laterality: N/A;  coronary artery bypass on pump times one utilizing the right internal mammary artery, transesophageal echocardiogram    HAND SURGERY Left    KNEE ARTHROSCOPY Right 12/10/2021   Procedure: RIGH TKNEE ARTHROSCOPY WITH SCAR BAND EXCISION;  Surgeon: Sheril Coy, MD;   Location: [facility] ORS;  Service: Orthopedics;  Laterality: Right;   LEFT HEART CATHETERIZATION WITH CORONARY ANGIOGRAM N/A 11/24/2011   Procedure: LEFT HEART CATHETERIZATION WITH CORONARY ANGIOGRAM;  Surgeon: Peter M Jordan, MD;  Location: [facility] CATH LAB;  Service: Cardiovascular;  Laterality: N/A;   LOOP RECORDER IMPLANT N/A 07/06/2012   Procedure: LOOP RECORDER IMPLANT;  Surgeon: Lynwood Rakers, MD;  Location: [facility] CATH LAB;  Service: Cardiovascular;  Laterality: N/A;   MAZE  11/26/2011   Procedure: MAZE;  Surgeon: Sudie VEAR Laine, MD;  Location[facility] OR;  Service: Open Heart Surgery;  Laterality: N/A;  Cryomaze    Radiofrequency Ablation for atrial fibrillation  12/30/07 and 06/05/09  afib ablation x 2 by [facility]   Radiofrequency ablation for atrial flutter  2005   CTI   TOTAL KNEE ARTHROPLASTY Right 04/05/2019   Procedure: RIGHT TOTAL KNEE ARTHROPLASTY;  Surgeon: Sheril Coy, MD;  Location: [facility] ORS;  Service: Orthopedics;  Laterality: Right;   Family History  Problem Relation Age of Onset   Dementia Father    Hypertension Father    Atrial fibrillation Mother    Stroke Mother    Dementia Paternal Grandfather    Cancer Paternal Grandmother        type unknown   Colon cancer Neg Hx    Esophageal cancer Neg Hx    Prostate cancer Neg Hx    Rectal cancer Neg Hx    Stomach cancer Neg Hx    Social History   Socioeconomic History   Marital status: Single    Spouse name: Not on file   Number of children: 0   Years of education: Not on file   Highest education level: Not on file  Occupational History   Occupation: works at the [employer] part-time  Tobacco Use   Smoking status: Former    Current packs/day: 0.00    Average packs/day: 1.5 packs/day for 26.0 years (39.0 ttl pk-yrs)    Types: Cigarettes    Start date: 04/05/1969    Quit date: 04/06/1995    Years since quitting: 27.9   Smokeless tobacco: Never  Vaping Use   Vaping status: Never Used  Substance and Sexual Activity   Alcohol use: No     Alcohol/week: 0.0 standard drinks of alcohol   Drug use: No   Sexual activity: Yes  Other Topics Concern   Not on file  Social History Narrative   Pt lives alone in [place], [place].  Has 2 dogs   Social Drivers of Corporate Investment Banker Strain: Low Risk  (03/02/2023)   Overall Financial Resource Strain (CARDIA)    Difficulty of Paying Living Expenses: Not very hard  Food Insecurity: No Food Insecurity (03/02/2023)   Hunger Vital Sign    Worried About Running Out of Food in the Last Year: Never true    Ran Out of Food in the Last Year: Never true  Transportation Needs: No Transportation Needs (03/02/2023)   PRAPARE - Administrator, Civil Service (Medical): No    Lack of Transportation (Non-Medical): No  Physical Activity: Sufficiently Active (03/02/2023)   Exercise Vital Sign    Days of Exercise per Week: 3 days    Minutes of Exercise per Session: 90 min  Stress: No Stress Concern Present (03/02/2023)   [employer] of Occupational Health - Occupational Stress Questionnaire    Feeling of Stress : Not at all  Social Connections: Socially Isolated (03/02/2023)   Social Connection and Isolation Panel [NHANES]    Frequency of Communication with Friends and Family: Once a week    Frequency of Social Gatherings with Friends and Family: Once a week    Attends Religious Services: Never    Database Administrator or Organizations: No    Attends Engineer, Structural: Never    Marital Status: Never married    Tobacco Counseling Counseling given: Not Answered   Clinical Intake:  Pre-visit preparation completed: Yes  Pain : No/denies pain     Diabetes: No  How often do you need to have someone help you when you read instructions, pamphlets, or other written materials from your doctor or pharmacy?: 1 - Never  Interpreter Needed?: No  Information entered by :: Eyleen Rawlinson, RMA   Activities of Daily Living    03/02/2023    3:33 PM 03/14/2022   10:58 AM   In your present state of health, do you have any difficulty performing the following activities:  Hearing? 0 0  Vision? 0 0  Difficulty concentrating or making decisions? 0 0  Walking or climbing stairs? 0 0  Dressing or bathing? 0 0  Doing errands, shopping? 0 0  Preparing Food and eating ? N N  Using the Toilet? N N  In the past six months, have you accidently leaked urine? N N  Do you have problems with loss of bowel control? N N  Managing your Medications? N N  Managing your Finances? N N  Housekeeping or managing your Housekeeping? N N    Patient Care Team: Norleen Lynwood ORN, MD as PCP - General Shlomo Wilbert SAUNDERS, MD as PCP - Sleep Medicine (Cardiology) Waddell Danelle ORN, MD as Consulting Physician (Cardiology) Abran Norleen SAILOR, MD as Consulting Physician (Gastroenterology) Madelyn Deanne BRAVO, OD as Consulting Physician (Optometry)  Indicate any recent Medical Services you may have received from other than [facility] providers in the past year (date may be approximate).     Assessment:   This is a routine wellness examination for Max.  Hearing/Vision screen Hearing Screening - Comments:: Some hearing loss-per pt Vision Screening - Comments:: Wears eyeglasses   Goals Addressed               This Visit's Progress     Patient Stated (pt-stated)        Would like to lose weight      Depression Screen    03/02/2023    3:46 PM 10/06/2022    9:02 AM 04/07/2022   10:00 AM 03/14/2022   10:56 AM 12/30/2021    8:27 AM 11/14/2021   10:43 AM 10/03/2021   10:07 AM  PHQ 2/9 Scores  PHQ - 2 Score 3 2 0 0 0 2 0  PHQ- 9 Score 3 3 0  0 7 0    Fall Risk    03/02/2023    3:43 PM 10/06/2022    9:02 AM 04/17/2022    2:27 PM 04/07/2022   10:00 AM 03/14/2022   10:57 AM  Fall Risk   Falls in the past year? 0 1 0 0 0  Number falls in past yr: 0 1 0 0 0  Injury with Fall? 0 1 0 0 0  Risk for fall due to : No Fall Risks History of fall(s) No Fall Risks;Impaired balance/gait No Fall Risks No Fall  Risks  Follow up Falls prevention discussed;Falls evaluation completed Falls evaluation completed Falls evaluation completed Falls evaluation completed Falls prevention discussed    MEDICARE RISK AT HOME: Medicare Risk at Home Any stairs in or around the home?: No Home free of loose throw rugs in walkways, pet beds, electrical cords, etc?: Yes Adequate lighting in your home to reduce risk of falls?: Yes Life alert?: No Use of a cane, walker or w/c?: No Grab bars in the bathroom?: No Shower chair or bench in shower?: No Elevated toilet seat or a handicapped toilet?: No  TIMED UP AND GO:  Was the test performed?  Yes  Length of time to ambulate 10 feet: 15 sec Gait steady and fast without use of assistive device    Cognitive Function:    10/09/2017   11:17 AM 03/21/2016    2:55  PM  MMSE - Mini Mental State Exam  Not completed: Refused   Orientation to time  4  Orientation to Place  5  Registration  3  Attention/ Calculation  5  Recall  2  Language- name 2 objects  2  Language- repeat  1  Language- follow 3 step command  3  Language- read & follow direction  1  Write a sentence  1  Copy design  1  Total score  28        03/02/2023    3:34 PM 03/14/2022   10:58 AM  6CIT Screen  What Year? 0 points 0 points  What month? 0 points 0 points  What time? 0 points 0 points  Count back from 20 0 points 0 points  Months in reverse 0 points 0 points  Repeat phrase 0 points 0 points  Total Score 0 points 0 points    Immunizations Immunization History  Administered Date(s) Administered   Fluad Quad(high Dose 65+) 10/31/2021, 11/14/2021   Influenza Inj Mdck Quad Pf 11/20/2018   Influenza Split 11/25/2011   Influenza Whole 01/13/2005, 11/26/2007, 12/06/2008, 11/29/2009   Influenza, High Dose Seasonal PF 12/03/2020   Influenza,inj,Quad PF,6+ Mos 03/08/2015, 11/04/2019   Influenza-Unspecified 10/25/2012, 11/21/2015, 11/25/2015, 12/25/2016, 12/10/2019   PFIZER Comirnaty(Gray  Top)Covid-19 Tri-Sucrose Vaccine 07/12/2020   PFIZER(Purple Top)SARS-COV-2 Vaccination 05/26/2019, 06/20/2019, 12/16/2019, 07/13/2020   PNEUMOCOCCAL CONJUGATE-20 06/08/2020   Pfizer Covid-19 Vaccine Bivalent Booster 50yrs & up 12/12/2020, 07/14/2021   Pfizer(Comirnaty)Fall Seasonal Vaccine 12 years and older 11/04/2022   Pneumococcal Conjugate-13 12/21/2015   Pneumococcal Polysaccharide-23 03/11/2011   Respiratory Syncytial Virus Vaccine,Recomb Aduvanted(Arexvy) 03/14/2022   Td 03/27/2004, 04/04/2014   Zoster Recombinant(Shingrix) 05/09/2021, 08/17/2021   Zoster, Live 01/04/2016    TDAP status: Up to date  Flu Vaccine status: Up to date  Pneumococcal vaccine status: Up to date  Covid-19 vaccine status: Completed vaccines  Qualifies for Shingles Vaccine? Yes   Zostavax completed Yes   Shingrix Completed?: Yes  Screening Tests Health Maintenance  Topic Date Due   Medicare Annual Wellness (AWV)  03/01/2024   DTaP/Tdap/Td (3 - Tdap) 04/04/2024   Colonoscopy  05/03/2025   Pneumonia Vaccine 73+ Years old  Completed   INFLUENZA VACCINE  Completed   COVID-19 Vaccine  Completed   Hepatitis C Screening  Completed   Zoster Vaccines- Shingrix  Completed   HPV VACCINES  Aged Out    Health Maintenance  There are no preventive care reminders to display for this patient.   Colorectal cancer screening: Type of screening: Colonoscopy. Completed 05/03/2020. Repeat every 5 years  Lung Cancer Screening: (Low Dose CT Chest recommended if Age 62-80 years, 20 pack-year currently smoking OR have quit w/in 15years.) does not qualify.   Lung Cancer Screening Referral: N/A  Additional Screening:  Hepatitis C Screening: does qualify; Completed 02/27/2017  Vision Screening: Recommended annual ophthalmology exams for early detection of glaucoma and other disorders of the eye. Is the patient up to date with their annual eye exam?  Yes  Who is the provider or what is the name of the office in  which the patient attends annual eye exams? [facility]) If pt is not established with a provider, would they like to be referred to a provider to establish care? Yes .   Dental Screening: Recommended annual dental exams for proper oral hygiene    Community Resource Referral / Chronic Care Management: CRR required this visit?  No   CCM required this visit?  No     Plan:     I have personally reviewed and noted the following in the patient's chart:   Medical and social history Use of alcohol, tobacco or illicit drugs  Current medications and supplements including opioid prescriptions. Patient is not currently taking opioid prescriptions. Functional ability and status Nutritional status Physical activity Advanced directives List of other physicians Hospitalizations, surgeries, and ER visits in previous 12 months Vitals Screenings to include cognitive, depression, and falls Referrals and appointments  In addition, I have reviewed and discussed with patient certain preventive protocols, quality metrics, and best practice recommendations. A written personalized care plan for preventive services as well as general preventive health recommendations were provided to patient.     Kyrsten Deleeuw L Danyla Wattley, CMA   03/02/2023   After Visit Summary: (In Person-Printed) AVS printed and given to the patient  Nurse Notes: Patient is up to date with all health maintenance.  He is asking for a referral or recommendation for a new eye doctor.  Patient had no other concerns to address today.

## 2023-03-04 ENCOUNTER — Other Ambulatory Visit: Payer: Self-pay

## 2023-03-04 ENCOUNTER — Other Ambulatory Visit: Payer: Self-pay | Admitting: Internal Medicine

## 2023-03-06 ENCOUNTER — Other Ambulatory Visit: Payer: Self-pay

## 2023-03-06 ENCOUNTER — Other Ambulatory Visit: Payer: Self-pay | Admitting: Internal Medicine

## 2023-03-27 ENCOUNTER — Telehealth: Payer: Self-pay | Admitting: *Deleted

## 2023-03-27 NOTE — Patient Outreach (Signed)
Care Coordination   Care Coordination  Visit Note   03/27/2023 Name: Scott [name] MRN: [id number] DOB: November 10, 1955  Scott [name] is a 68 y.o. year old male who sees Scott Levins, MD for primary care. I spoke with  Scott [name] by phone today.  Received voice mail message from patient whom I worked with several years ago in chronic care management, requesting call-back- returned his call accordingly  What matters to the patients health and wellness today?  "I haven't talked with you for awhile lately, and I wanted to call you to ask your advice; my insurance nurse called me yesterday, and she said that this gradual weight gain I have been having 'could be' fluid retention; I have no been on the fluid pill for quite a long time now, and she said I "might" need to re-start it;  I have just noticed lately that I am having gradual weight gain since I retired from my job.  I am still going to the [facility] to exercise and walking my dogs regularly; still get a little short of breath like I always have when I exercise, but since the nurse told me I might should start taking fluid pills again, I thought I would call you and ask you about it-- I am really fine, it's nothing urgent, so I will start weighing myself everyday and write it down to take to Scott [name] when I have my next visit on 04/08/23.  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; he denies specific clinical concerns including acute weight gain, peripheral swelling, and increased shortness of breath from baseline  today- reports he "feels normal;" and denies need to see PCP earlier than scheduled office visit on 04/08/23  Advised patient to begin daily weight monitoring/ recording and to take to upcoming scheduled PCP office visit on 04/08/23  Reinforced previously provided rationale for daily weight monitoring at home along with weight gain guidelines/ action plan for weight gain; patient verbalizes good understanding of same and states he will do  Interventions Today    Flowsheet Row Most Recent Value  Chronic Disease   Chronic disease during today's visit Congestive Heart Failure (CHF)  General Interventions   General Interventions Discussed/Reviewed General Interventions Discussed, Doctor Visits  Doctor Visits Discussed/Reviewed Doctor Visits Discussed, PCP  PCP/Specialist Visits Compliance with follow-up visit  [verified scheduled for PCP OV on 04/08/23: confirmed patient aware/ has plans to attend as scheduled]  Exercise Interventions   Exercise Discussed/Reviewed Exercise Discussed, Weight Managment  [confirmed patient still attends [employer] for regular exercise]  Weight Management --  [reports gradual weight gain since retiring from his job several months ago]  Education Interventions   Education Provided Provided Education  Provided Engineer, petroleum On When to see the doctor, Other  [Confirmed patient is NOT currently obtaining daily weights at home: provided education / rationale for daily weight monitoring at home as earliest indicator of fluid retention, along with weight gain guidelines/ action plan for weight gain]  Nutrition Interventions   Nutrition Discussed/Reviewed Nutrition Discussed  [  confirmed continues "trying" to follow heart healthy low salt diet]  Pharmacy Interventions   Pharmacy Dicussed/Reviewed Pharmacy Topics Discussed  Safety Interventions   Safety Discussed/Reviewed Safety Discussed       SDOH assessments and interventions completed:  Yes  SDOH Interventions Today     Flowsheet Row Most Recent Value  SDOH Interventions   Food Insecurity Interventions Intervention Not Indicated  Housing Interventions Intervention Not Indicated  Transportation Interventions Intervention Not Indicated  [continues to drive self]  Utilities Interventions Intervention Not Indicated       Care Coordination Interventions:  Yes, provided   Follow up plan: No further intervention required. Patient confirms he has my direct contact information and will contact me in the future if he has any ongoing needs/ questions  Encounter Outcome:  Patient Visit Completed   Caryl Pina, RN, BSN, CCRN Alumnus RN Care Manager  Transitions of Care  [facility] [phone]: direct office

## 2023-04-05 ENCOUNTER — Other Ambulatory Visit: Payer: Self-pay | Admitting: Internal Medicine

## 2023-04-06 ENCOUNTER — Other Ambulatory Visit: Payer: Self-pay

## 2023-04-07 ENCOUNTER — Telehealth: Payer: Self-pay

## 2023-04-07 NOTE — Telephone Encounter (Signed)
Copied from CRM [id number]. Topic: General - Other >> Apr 07, 2023  8:21 AM Truddie Crumble wrote: Reason for CRM: Misty Stanley (case manager) from [employer] called stating that the patient is concerned  that he may forget to mention his memory when he comes in for his appointment tomorrow. Misty Stanley stated the family has a history of dementia [phone]

## 2023-04-08 ENCOUNTER — Ambulatory Visit: Payer: PPO | Admitting: Internal Medicine

## 2023-04-08 ENCOUNTER — Encounter: Payer: Self-pay | Admitting: Internal Medicine

## 2023-04-08 VITALS — BP 126/72 | HR 68 | Temp 98.0°F | Ht 66.0 in | Wt 227.0 lb

## 2023-04-08 DIAGNOSIS — E7800 Pure hypercholesterolemia, unspecified: Secondary | ICD-10-CM | POA: Diagnosis not present

## 2023-04-08 DIAGNOSIS — Z0001 Encounter for general adult medical examination with abnormal findings: Secondary | ICD-10-CM | POA: Diagnosis not present

## 2023-04-08 DIAGNOSIS — M1711 Unilateral primary osteoarthritis, right knee: Secondary | ICD-10-CM | POA: Diagnosis not present

## 2023-04-08 DIAGNOSIS — E559 Vitamin D deficiency, unspecified: Secondary | ICD-10-CM

## 2023-04-08 DIAGNOSIS — I10 Essential (primary) hypertension: Secondary | ICD-10-CM

## 2023-04-08 DIAGNOSIS — Z125 Encounter for screening for malignant neoplasm of prostate: Secondary | ICD-10-CM | POA: Diagnosis not present

## 2023-04-08 DIAGNOSIS — R413 Other amnesia: Secondary | ICD-10-CM | POA: Diagnosis not present

## 2023-04-08 DIAGNOSIS — E538 Deficiency of other specified B group vitamins: Secondary | ICD-10-CM | POA: Diagnosis not present

## 2023-04-08 DIAGNOSIS — R739 Hyperglycemia, unspecified: Secondary | ICD-10-CM

## 2023-04-08 DIAGNOSIS — K921 Melena: Secondary | ICD-10-CM

## 2023-04-08 DIAGNOSIS — R079 Chest pain, unspecified: Secondary | ICD-10-CM

## 2023-04-08 DIAGNOSIS — Z7901 Long term (current) use of anticoagulants: Secondary | ICD-10-CM

## 2023-04-08 DIAGNOSIS — R001 Bradycardia, unspecified: Secondary | ICD-10-CM

## 2023-04-08 DIAGNOSIS — N1831 Chronic kidney disease, stage 3a: Secondary | ICD-10-CM

## 2023-04-08 LAB — URINALYSIS, ROUTINE W REFLEX MICROSCOPIC
Bilirubin Urine: NEGATIVE
Hgb urine dipstick: NEGATIVE
Ketones, ur: NEGATIVE
Leukocytes,Ua: NEGATIVE
Nitrite: NEGATIVE
RBC / HPF: NONE SEEN (ref 0–?)
Specific Gravity, Urine: 1.02 (ref 1.000–1.030)
Total Protein, Urine: NEGATIVE
Urine Glucose: NEGATIVE
Urobilinogen, UA: 0.2 (ref 0.0–1.0)
pH: 6 (ref 5.0–8.0)

## 2023-04-08 LAB — CBC WITH DIFFERENTIAL/PLATELET
Basophils Absolute: 0.1 10*3/uL (ref 0.0–0.1)
Basophils Relative: 0.9 % (ref 0.0–3.0)
Eosinophils Absolute: 0.4 10*3/uL (ref 0.0–0.7)
Eosinophils Relative: 4.8 % (ref 0.0–5.0)
HCT: 45.6 % (ref 39.0–52.0)
Hemoglobin: 15.4 g/dL (ref 13.0–17.0)
Lymphocytes Relative: 17.1 % (ref 12.0–46.0)
Lymphs Abs: 1.3 10*3/uL (ref 0.7–4.0)
MCHC: 33.9 g/dL (ref 30.0–36.0)
MCV: 95.2 fL (ref 78.0–100.0)
Monocytes Absolute: 0.7 10*3/uL (ref 0.1–1.0)
Monocytes Relative: 8.7 % (ref 3.0–12.0)
Neutro Abs: 5.3 10*3/uL (ref 1.4–7.7)
Neutrophils Relative %: 68.5 % (ref 43.0–77.0)
Platelets: 230 10*3/uL (ref 150.0–400.0)
RBC: 4.79 Mil/uL (ref 4.22–5.81)
RDW: 13.2 % (ref 11.5–15.5)
WBC: 7.8 10*3/uL (ref 4.0–10.5)

## 2023-04-08 LAB — FERRITIN: Ferritin: 19.2 ng/mL — ABNORMAL LOW (ref 22.0–322.0)

## 2023-04-08 LAB — BASIC METABOLIC PANEL
BUN: 21 mg/dL (ref 6–23)
CO2: 30 meq/L (ref 19–32)
Calcium: 9.5 mg/dL (ref 8.4–10.5)
Chloride: 102 meq/L (ref 96–112)
Creatinine, Ser: 1.45 mg/dL (ref 0.40–1.50)
GFR: 49.69 mL/min — ABNORMAL LOW (ref >60.00)
Glucose, Bld: 99 mg/dL (ref 70–99)
Potassium: 4.5 meq/L (ref 3.5–5.1)
Sodium: 140 meq/L (ref 135–145)

## 2023-04-08 LAB — HEMOGLOBIN A1C: Hgb A1c MFr Bld: 5.8 % (ref 4.6–6.5)

## 2023-04-08 LAB — LIPID PANEL
Cholesterol: 138 mg/dL (ref 0–200)
HDL: 44.3 mg/dL (ref >39.00)
LDL Cholesterol: 67 mg/dL (ref 0–99)
NonHDL: 93.71
Total CHOL/HDL Ratio: 3
Triglycerides: 133 mg/dL (ref 0.0–149.0)
VLDL: 26.6 mg/dL (ref 0.0–40.0)

## 2023-04-08 LAB — HEPATIC FUNCTION PANEL
ALT: 28 U/L (ref 0–53)
AST: 26 U/L (ref 0–37)
Albumin: 4.6 g/dL (ref 3.5–5.2)
Alkaline Phosphatase: 71 U/L (ref 39–117)
Bilirubin, Direct: 0.1 mg/dL (ref 0.0–0.3)
Total Bilirubin: 0.7 mg/dL (ref 0.2–1.2)
Total Protein: 7.7 g/dL (ref 6.0–8.3)

## 2023-04-08 LAB — VITAMIN B12: Vitamin B-12: 394 pg/mL (ref 211–911)

## 2023-04-08 LAB — VITAMIN D 25 HYDROXY (VIT D DEFICIENCY, FRACTURES): VITD: 41.14 ng/mL (ref 30.00–100.00)

## 2023-04-08 LAB — TSH: TSH: 3.13 u[IU]/mL (ref 0.35–5.50)

## 2023-04-08 LAB — PSA: PSA: 1.89 ng/mL (ref 0.10–4.00)

## 2023-04-08 LAB — IBC PANEL
Iron: 131 ug/dL (ref 42–165)
Saturation Ratios: 26.8 % (ref 20.0–50.0)
TIBC: 488.6 ug/dL — ABNORMAL HIGH (ref 250.0–450.0)
Transferrin: 349 mg/dL (ref 212.0–360.0)

## 2023-04-08 MED ORDER — METOPROLOL SUCCINATE ER 50 MG PO TB24: ORAL_TABLET | ORAL | 3 refills | Status: AC

## 2023-04-08 MED ORDER — TRELEGY ELLIPTA 100-62.5-25 MCG/ACT IN AEPB: INHALATION_SPRAY | RESPIRATORY_TRACT | 3 refills | Status: AC

## 2023-04-08 MED ORDER — OLMESARTAN MEDOXOMIL 40 MG PO TABS
40.0000 mg | ORAL_TABLET | Freq: Every day | ORAL | 3 refills | Status: AC
Start: 2023-04-08 — End: ?

## 2023-04-08 MED ORDER — LOVASTATIN 40 MG PO TABS: 40.0000 mg | ORAL_TABLET | Freq: Every day | ORAL | 3 refills | Status: AC

## 2023-04-08 MED ORDER — HYDROCHLOROTHIAZIDE 12.5 MG PO CAPS: 12.5000 mg | ORAL_CAPSULE | Freq: Every day | ORAL | 3 refills | Status: AC

## 2023-04-08 MED ORDER — SPIRONOLACTONE 25 MG PO TABS: ORAL_TABLET | ORAL | 2 refills | Status: DC

## 2023-04-08 MED ORDER — DONEPEZIL HCL 5 MG PO TABS: 5.0000 mg | ORAL_TABLET | Freq: Every day | ORAL | 3 refills | Status: DC

## 2023-04-08 MED ORDER — ALLOPURINOL 100 MG PO TABS: 100.0000 mg | ORAL_TABLET | Freq: Every day | ORAL | 3 refills | Status: DC

## 2023-04-08 MED ORDER — GABAPENTIN 100 MG PO CAPS: ORAL_CAPSULE | ORAL | 3 refills | Status: AC

## 2023-04-08 MED ORDER — AMLODIPINE BESYLATE 5 MG PO TABS
5.0000 mg | ORAL_TABLET | Freq: Every day | ORAL | 3 refills | Status: AC
Start: 2023-04-08 — End: 2024-04-07

## 2023-04-08 MED ORDER — COLCHICINE 0.6 MG PO TABS: 0.6000 mg | ORAL_TABLET | Freq: Every day | ORAL | 3 refills | Status: DC | PRN

## 2023-04-08 NOTE — Patient Instructions (Addendum)
Please take all new medication as prescribed -the aricept 5 mg per day for memory  Your EKG was OK today  Please continue all other medications as before, and refills have been done if requested.  Please have the pharmacy call with any other refills you may need.  Please continue your efforts at being more active, low cholesterol diet, and weight control.  You are otherwise up to date with prevention measures today.  Please keep your appointments with your specialists as you may have planned  You will be contacted regarding the referral for: heart stress test, MRI for the brain, orthopedic Dr Jerl Santos, GI Dr Marina Goodell, and Neurology  Please go to the LAB at the blood drawing area for the tests to be done  You will be contacted by phone if any changes need to be made immediately.  Otherwise, you will receive a letter about your results with an explanation, but please check with MyChart first.  Please make an Appointment to return in 6 months, or sooner if needed

## 2023-04-08 NOTE — Progress Notes (Signed)
 Patient ID: Scott [name], male   DOB: 15-Oct-1955, 68 y.o.   MRN: [id number]         Chief Complaint:: wellness exam and cheset pain, right knee pain swelling, memory changes, hematochezia, chronic anticoagulation       HPI:  Scott [name] is a 68 y.o. male here for wellness exam; up to date                   Also Pt denies increased sob or doe, wheezing, orthopnea, PND, increased LE swelling, palpitations, dizziness or syncope but has intermittent mild left chest pains, dull, without radiation, n/v, diaphoresis, and not clear if exertional.  Not positional or pleuritic.   Pt denies polydipsia, polyuria, or new focal neuro s/s.    Pt denies fever, wt loss, night sweats, loss of appetite, or other constitutional symptoms  Does also have marked right knee pain and swelling acute on chronic but no giveaways or falls, feels he can walk on treadmill, but also ok to see ortho.  Also has mild to mod worsening memory changes  - recnt short term only it seems but significant, lives alone, getting more concerned.   Is able to handle meds and cares for his dogs.  Also incidentally with several episodes painless hematochezia, Is on eliquis.  Last colonoscopy with Dr Marina [facility]  - mar 2022 with polyp x 2, diverticulosis, and internal hemorrhoid   Wt Readings from Last 3 Encounters:  04/08/23 227 lb (103 kg)  03/02/23 223 lb 3.2 oz (101.2 kg)  10/06/22 214 lb (97.1 kg)   BP Readings from Last 3 Encounters:  04/08/23 126/72  03/02/23 110/70  10/06/22 126/74   Immunization History  Administered Date(s) Administered   Fluad Quad(high Dose 65+) 10/31/2021, 11/14/2021   Influenza Inj Mdck Quad Pf 11/20/2018   Influenza Split 11/25/2011   Influenza Whole 01/13/2005, 11/26/2007, 12/06/2008, 11/29/2009   Influenza, High Dose Seasonal PF 12/03/2020, 11/04/2022   Influenza,inj,Quad PF,6+ Mos 03/08/2015, 11/04/2019   Influenza-Unspecified 10/25/2012, 11/21/2015, 11/25/2015, 12/25/2016, 12/10/2019   PFIZER  Comirnaty(Gray Top)Covid-19 Tri-Sucrose Vaccine 07/12/2020   PFIZER(Purple Top)SARS-COV-2 Vaccination 05/26/2019, 06/20/2019, 12/16/2019, 07/13/2020   PNEUMOCOCCAL CONJUGATE-20 06/08/2020   Pfizer Covid-19 Vaccine Bivalent Booster 56yrs & up 12/12/2020, 07/14/2021   Pfizer(Comirnaty)Fall Seasonal Vaccine 12 years and older 11/04/2022   Pneumococcal Conjugate-13 12/21/2015   Pneumococcal Polysaccharide-23 03/11/2011   Respiratory Syncytial Virus Vaccine,Recomb Aduvanted(Arexvy) 03/14/2022   Td 03/27/2004, 04/04/2014   Zoster Recombinant(Shingrix) 05/09/2021, 08/17/2021   Zoster, Live 01/04/2016  There are no preventive care reminders to display for this patient.    Past Medical History:  Diagnosis Date   ALLERGIC RHINITIS 01/14/2007   Allergy    ANXIETY 09/28/2006   Asbestos exposure    Atrial fibrillation (HCC) 09/28/2006   s/p afib ablation x 2   CHF (congestive heart failure) (HCC)    CKD (chronic kidney disease), stage III (HCC)    patient denies   COPD (chronic obstructive pulmonary disease) (HCC)    Coronary artery disease 11/24/2011   Diastolic dysfunction 09/28/2006   Dyspnea    W/ EXERTION    Dysrhythmia    AFIB      First degree AV block 10/12/2018   Noted on EKG   Gallstones    Hyperglycemia    HYPERLIPIDEMIA 09/28/2006   HYPERTENSION 09/28/2006   HYPOTHYROIDISM, ACQUIRED NEC 09/28/2006   pt denies and doesn't know   Long term current use of anticoagulant 04/08/2010   Obese  OBSTRUCTIVE SLEEP APNEA 01/06/2008   NOT USING     Right knee DJD 07/09/2010   S/P CABG x 1 11/26/2011   Right internal mammary artery to right coronary artery   S/P Maze operation for atrial fibrillation 11/26/2011   complete biatrial lesion set with clipping of LA appendage   Sleep apnea    Past Surgical History:  Procedure Laterality Date   ANTERIOR CERVICAL DECOMP/DISCECTOMY FUSION N/A 06/20/2016   Procedure: Anterior Cervical Discectomy Fusion - Cervical five-Cervical six - Cervical  six-Cervical seven - Cervicla seven- Thoracic one;  Surgeon: Julio Sicks, MD;  [facility];  Service: Neurosurgery;  Laterality: N/A;   CARDIOVERSION     X4    CORONARY ARTERY BYPASS GRAFT  11/26/2011   Procedure: CORONARY ARTERY BYPASS GRAFTING (CABG);  Surgeon: Purcell Nails, MD;  Location: [facility] OR;  Service: Open Heart Surgery;  Laterality: N/A;  coronary artery bypass on pump times one utilizing the right internal mammary artery, transesophageal echocardiogram    HAND SURGERY Left    KNEE ARTHROSCOPY Right 12/10/2021   Procedure: RIGH TKNEE ARTHROSCOPY WITH SCAR BAND EXCISION;  Surgeon: Marcene Corning, MD;  Location: [facility] ORS;  Service: Orthopedics;  Laterality: Right;   LEFT HEART CATHETERIZATION WITH CORONARY ANGIOGRAM N/A 11/24/2011   Procedure: LEFT HEART CATHETERIZATION WITH CORONARY ANGIOGRAM;  Surgeon: Peter M Swaziland, MD;  Location: [facility] CATH LAB;  Service: Cardiovascular;  Laterality: N/A;   LOOP RECORDER IMPLANT N/A 07/06/2012   Procedure: LOOP RECORDER IMPLANT;  Surgeon: [name], MD;  Location: [facility] CATH LAB;  Service: Cardiovascular;  Laterality: N/A;   MAZE  11/26/2011   Procedure: MAZE;  Surgeon: Purcell Nails, MD;  Location: [facility] OR;  Service: Open Heart Surgery;  Laterality: N/A;  Cryomaze    Radiofrequency Ablation for atrial fibrillation  12/30/07 and 06/05/09   afib ablation x 2 by JA   Radiofrequency ablation for atrial flutter  2005   CTI   TOTAL KNEE ARTHROPLASTY Right 04/05/2019   Procedure: RIGHT TOTAL KNEE ARTHROPLASTY;  Surgeon: Marcene Corning, MD;  Location: [facility] ORS;  Service: Orthopedics;  Laterality: Right;    reports that he quit smoking about 28 years ago. His smoking use included cigarettes. He started smoking about 54 years ago. He has a 39 pack-year smoking history. He has never used smokeless tobacco. He reports that he does not drink alcohol and does not use drugs. family history includes Atrial fibrillation in his mother; Cancer in his paternal grandmother;  Dementia in his father and paternal grandfather; Hypertension in his father; Stroke in his mother. Allergies  Allergen Reactions   Zolpidem Tartrate Other (See Comments)    Extremely drowsy the next day   Current Outpatient Medications on File Prior to Visit  Medication Sig Dispense Refill   acetaminophen (TYLENOL) 500 MG tablet Take 1,000 mg by mouth every 6 (six) hours as needed for mild pain.      albuterol (VENTOLIN HFA) 108 (90 Base) MCG/ACT inhaler TAKE 2 PUFFS BY MOUTH EVERY 6 HOURS AS NEEDED FOR WHEEZE OR SHORTNESS OF BREATH 6.7 each 5   ALPRAZolam (XANAX) 0.5 MG tablet TAKE 1 TABLET BY MOUTH TWICE A DAY AS NEEDED FOR ANXIETY 60 tablet 2   cholecalciferol (VITAMIN D) 1000 units tablet Take 1,000 Units by mouth daily.     clotrimazole-betamethasone (LOTRISONE) cream Apply 1 Application topically daily. 30 g 1   co-enzyme Q-10 30 MG capsule Take 30 mg by mouth daily.     cyclobenzaprine (FLEXERIL) 5  MG tablet Take 1 tablet (5 mg total) by mouth 3 (three) times daily as needed for muscle spasms. 40 tablet 1   docusate sodium (COLACE) 100 MG capsule Take 200 mg by mouth daily as needed for mild constipation.      ELIQUIS 5 MG TABS tablet TAKE 1 TABLET BY MOUTH TWICE A DAY 180 tablet 3   furosemide (LASIX) 20 MG tablet TAKE 1 TABLET BY MOUTH EVERY DAY AS NEEDED (Patient taking differently: Take 20 mg by mouth daily.) 15 tablet 0   Multiple Vitamin (MULTIVITAMIN WITH MINERALS) TABS tablet Take 1 tablet by mouth daily.      temazepam (RESTORIL) 30 MG capsule TAKE 1 CAPSULE (30 MG TOTAL) BY MOUTH AT BEDTIME AS NEEDED. FOR SLEEP 30 capsule 5   traMADol (ULTRAM) 50 MG tablet TAKE 1 TABLET BY MOUTH EVERY 6 HOURS AS NEEDED 120 tablet 5   No current facility-administered medications on file prior to visit.        ROS:  All others reviewed and negative.  Objective        PE:  BP 126/72 (BP Location: Right Arm, Patient Position: Sitting, Cuff Size: Normal)   Pulse 68   Temp 98 F (36.7 C)  (Oral)   Ht 5\' 6"  (1.676 m)   Wt 227 lb (103 kg)   SpO2 94%   BMI 36.64 kg/m                 Constitutional: Pt appears in NAD               HENT: Head: NCAT.                Right Ear: External ear normal.                 Left Ear: External ear normal.                Eyes: . Pupils are equal, round, and reactive to light. Conjunctivae and EOM are normal               Nose: without d/c or deformity               Neck: Neck supple. Gross normal ROM               Cardiovascular: Normal rate and regular rhythm.                 Pulmonary/Chest: Effort normal and breath sounds without rales or wheezing.                Abd:  Soft, NT, ND, + BS, no organomegaly               Neurological: Pt is alert. At baseline orientation, motor grossly intact               Skin: Skin is warm. No rashes, no other new lesions, LE edema - none               Psychiatric: Pt behavior is normal without agitation   Micro: none  Cardiac tracings I have personally interpreted today:  none  Pertinent Radiological findings (summarize): none   Lab Results  Component Value Date   WBC 7.8 04/08/2023   HGB 15.4 04/08/2023   HCT 45.6 04/08/2023   PLT 230.0 04/08/2023   GLUCOSE 99 04/08/2023   CHOL 138 04/08/2023   TRIG 133.0 04/08/2023   HDL 44.30 04/08/2023   LDLCALC 67 04/08/2023  ALT 28 04/08/2023   AST 26 04/08/2023   NA 140 04/08/2023   K 4.5 04/08/2023   CL 102 04/08/2023   CREATININE 1.45 04/08/2023   BUN 21 04/08/2023   CO2 30 04/08/2023   TSH 3.13 04/08/2023   PSA 1.89 04/08/2023   INR 1.3 (H) 01/01/2021   HGBA1C 5.8 04/08/2023   MICROALBUR 0.9 04/08/2023   Assessment/Plan:  Flay Ghosh [name] Montez Hageman is a 68 y.o. White or Caucasian [1] male with  has a past medical history of ALLERGIC RHINITIS (01/14/2007), Allergy, ANXIETY (09/28/2006), Asbestos exposure, Atrial fibrillation (HCC) (09/28/2006), CHF (congestive heart failure) (HCC), CKD (chronic kidney disease), stage III (HCC), COPD (chronic  obstructive pulmonary disease) (HCC), Coronary artery disease (11/24/2011), Diastolic dysfunction (09/28/2006), Dyspnea, Dysrhythmia, First degree AV block (10/12/2018), Gallstones, Hyperglycemia, HYPERLIPIDEMIA (09/28/2006), HYPERTENSION (09/28/2006), HYPOTHYROIDISM, ACQUIRED NEC (09/28/2006), Long term current use of anticoagulant (04/08/2010), Obese, OBSTRUCTIVE SLEEP APNEA (01/06/2008), Right knee DJD (07/09/2010), S/P CABG x 1 (11/26/2011), S/P Maze operation for atrial fibrillation (11/26/2011), and Sleep apnea.  Chronic anticoagulation Pt to continue eliquis for now  Encounter for well adult exam with abnormal findings Age and sex appropriate education and counseling updated with regular exercise and diet Referrals for preventative services - none needed Immunizations addressed - none needed Smoking counseling  - none needed Evidence for depression or other mood disorder - none significant Most recent labs reviewed. I have personally reviewed and have noted: 1) the patient's medical and social history 2) The patient's current medications and supplements 3) The patient's height, weight, and BMI have been recorded in the chart   Bradycardia Ecg reviewed, stable,  to f/u any worsening symptoms or concerns  Chest pain Aytpical, etiology unclear, for stress test  CKD (chronic kidney disease) stage 3, GFR 30-59 ml/min Lab Results  Component Value Date   CREATININE 1.45 04/08/2023   Stable overall, cont to avoid nephrotoxins   Essential hypertension BP Readings from Last 3 Encounters:  04/08/23 126/72  03/02/23 110/70  10/06/22 126/74   Stable, pt to continue medical treatment norvasc 5 every day, hct 12.5 every day, toprol xl 50 qd , benicar 40 qd   Hematochezia Painless, etiology unclear, for cbc with labs, anusol hc supp prn, refer GI   Hyperglycemia Lab Results  Component Value Date   HGBA1C 5.8 04/08/2023   Stable, pt to continue current medical treatment  - diet, wt  control   Hyperlipidemia Lab Results  Component Value Date   LDLCALC 67 04/08/2023   Stable, pt to continue current statin lovastatin 40 qd   Memory changes With recent ST worsening, likely at least MCI - for aricept 5 every day, brain MRI, refer neurology per pt request  Primary osteoarthritis of right knee With recent pain swelling worsening, for refer ortho Dr Jerl Santos  Followup: Return in about 6 months (around 10/06/2023).  Oliver Barre, MD 04/11/2023 5:44 PM [facility]

## 2023-04-09 LAB — MICROALBUMIN / CREATININE URINE RATIO
Creatinine,U: 131 mg/dL
Microalb Creat Ratio: 6.6 mg/g (ref 0.0–30.0)
Microalb, Ur: 0.9 mg/dL (ref 0.0–1.9)

## 2023-04-11 ENCOUNTER — Encounter: Payer: Self-pay | Admitting: Internal Medicine

## 2023-04-11 NOTE — Assessment & Plan Note (Signed)

## 2023-04-11 NOTE — Assessment & Plan Note (Signed)
 With recent pain swelling worsening, for refer ortho Dr Jerl Santos

## 2023-04-11 NOTE — Assessment & Plan Note (Signed)
 Pt to continue eliquis for now

## 2023-04-11 NOTE — Assessment & Plan Note (Signed)
 Lab Results  Component Value Date   HGBA1C 5.8 04/08/2023   Stable, pt to continue current medical treatment  - diet, wt control

## 2023-04-11 NOTE — Assessment & Plan Note (Signed)
 Lab Results  Component Value Date   LDLCALC 67 04/08/2023   Stable, pt to continue current statin lovastatin 40 qd

## 2023-04-11 NOTE — Assessment & Plan Note (Signed)
 Lab Results  Component Value Date   CREATININE 1.45 04/08/2023   Stable overall, cont to avoid nephrotoxins

## 2023-04-11 NOTE — Assessment & Plan Note (Signed)
 BP Readings from Last 3 Encounters:  04/08/23 126/72  03/02/23 110/70  10/06/22 126/74   Stable, pt to continue medical treatment norvasc 5 every day, hct 12.5 every day, toprol xl 50 qd , benicar 40 qd

## 2023-04-11 NOTE — Assessment & Plan Note (Signed)
 With recent ST worsening, likely at least MCI - for aricept 5 every day, brain MRI, refer neurology per pt request

## 2023-04-11 NOTE — Assessment & Plan Note (Signed)
 Ecg reviewed, stable,  to f/u any worsening symptoms or concerns

## 2023-04-11 NOTE — Assessment & Plan Note (Signed)
 Aytpical, etiology unclear, for stress test

## 2023-04-11 NOTE — Assessment & Plan Note (Signed)
 Painless, etiology unclear, for cbc with labs, anusol hc supp prn, refer GI

## 2023-04-14 ENCOUNTER — Telehealth (HOSPITAL_COMMUNITY): Payer: Self-pay | Admitting: *Deleted

## 2023-04-14 NOTE — Telephone Encounter (Signed)
 Patient given detailed instructions per Myocardial Perfusion Study Information Sheet for the test on 04/22/23 Patient notified to arrive 15 minutes early and that it is imperative to arrive on time for appointment to keep from having the test rescheduled.  If you need to cancel or reschedule your appointment, please call the office within 24 hours of your appointment. . Patient verbalized understanding.Ricky Ala, RN

## 2023-04-22 ENCOUNTER — Ambulatory Visit (HOSPITAL_COMMUNITY): Payer: PPO | Attending: Internal Medicine

## 2023-04-22 ENCOUNTER — Encounter: Payer: Self-pay | Admitting: Internal Medicine

## 2023-04-22 DIAGNOSIS — R079 Chest pain, unspecified: Secondary | ICD-10-CM | POA: Insufficient documentation

## 2023-04-22 LAB — MYOCARDIAL PERFUSION IMAGING
Estimated workload: 1
Exercise duration (min): 1 min
Exercise duration (sec): 0 s
LV dias vol: 98 mL (ref 62–150)
LV sys vol: 47 mL
MPHR: 152 {beats}/min
Nuc Stress EF: 53 %
Peak HR: 91 {beats}/min
Percent HR: 59 %
Rest HR: 70 {beats}/min
Rest Nuclear Isotope Dose: 10.6 mCi
SDS: 0
SRS: 1
SSS: 1
ST Depression (mm): 0 mm
Stress Nuclear Isotope Dose: 29.1 mCi
TID: 1.03

## 2023-04-22 MED ORDER — REGADENOSON 0.4 MG/5ML IV SOLN
0.4000 mg | Freq: Once | INTRAVENOUS | Status: AC
  Administered 2023-04-22: 0.4 mg via INTRAVENOUS

## 2023-04-22 MED ORDER — TECHNETIUM TC 99M TETROFOSMIN IV KIT
29.1000 | PACK | Freq: Once | INTRAVENOUS | Status: AC | PRN
  Administered 2023-04-22: 29.1 via INTRAVENOUS

## 2023-04-22 MED ORDER — TECHNETIUM TC 99M TETROFOSMIN IV KIT
10.6000 | PACK | Freq: Once | INTRAVENOUS | Status: AC | PRN
  Administered 2023-04-22: 10.6 via INTRAVENOUS

## 2023-04-24 ENCOUNTER — Encounter: Payer: Self-pay | Admitting: Internal Medicine

## 2023-04-24 ENCOUNTER — Ambulatory Visit
Admission: RE | Admit: 2023-04-24 | Discharge: 2023-04-24 | Disposition: A | Discharge status: Home / Self Care | Payer: PPO | Source: Ambulatory Visit | Attending: Internal Medicine | Admitting: Internal Medicine

## 2023-04-24 DIAGNOSIS — R413 Other amnesia: Secondary | ICD-10-CM

## 2023-06-01 ENCOUNTER — Other Ambulatory Visit: Payer: Self-pay | Admitting: Internal Medicine

## 2023-06-11 ENCOUNTER — Telehealth: Payer: Self-pay | Admitting: *Deleted

## 2023-06-11 NOTE — Telephone Encounter (Signed)
   Pre-operative Risk Assessment    Patient Name: Scott [name]  DOB: 1955-05-21 MRN: [id number]   Date of last office visit: 08/18/22 DR. TURNER Date of next office visit: NONE   Request for Surgical Clearance    Procedure:   RIGHT TOTAL ANTERIOR HIP ARTHROPLASTY  Date of Surgery:  Clearance TBD                                Surgeon:  DR. Dayne Even Surgeon's Group or Practice Name:  [facility] Phone number:  [phone] REBECCA LANG Fax number:  [phone]   Type of Clearance Requested:   - Medical  - Pharmacy:  Hold Apixaban (Eliquis)     Type of Anesthesia:  Spinal   Additional requests/questions:    [place] Broom   06/11/2023, 2:08 PM

## 2023-06-11 NOTE — Telephone Encounter (Signed)
 Pharmacy please advise on holding Eliquis prior to right total anterior hip replacement scheduled for TBD. Thank you.

## 2023-06-12 ENCOUNTER — Telehealth: Payer: Self-pay | Admitting: *Deleted

## 2023-06-12 NOTE — Telephone Encounter (Signed)
 Patient with diagnosis of atrial fibrillation on Eliquis  for anticoagulation.    Procedure:   RIGHT TOTAL ANTERIOR HIP ARTHROPLASTY   Date of Surgery:  Clearance TBD     CHA2DS2-VASc Score = 4   This indicates a 4.8% annual risk of stroke. The patient's score is based upon: CHF History: 1 HTN History: 1 Diabetes History: 0 Stroke History: 0 Vascular Disease History: 1 Age Score: 1 Gender Score: 0   CrCl 71 Platelet count 230  Per office protocol, patient can hold Eliquis  for 3 days prior to procedure.   Patient will not need bridging with Lovenox (enoxaparin) around procedure.  **This guidance is not considered finalized until pre-operative APP has relayed final recommendations.**

## 2023-06-12 NOTE — Telephone Encounter (Signed)
 Pt has been scheduled tele preop appt 06/19/23 as surgery not scheduled until cleared. Med rec and consent are done.     Patient Consent for Virtual Visit        Scott [name] has provided verbal consent on 06/12/2023 for a virtual visit (video or telephone).   CONSENT FOR VIRTUAL VISIT FOR:  Scott [name]  By participating in this virtual visit I agree to the following:  I hereby voluntarily request, consent and authorize [facility] and its employed or contracted physicians, physician assistants, nurse practitioners or other licensed health care professionals (the Practitioner), to provide me with telemedicine health care services (the "Services) as deemed necessary by the treating Practitioner. I acknowledge and consent to receive the Services by the Practitioner via telemedicine. I understand that the telemedicine visit will involve communicating with the Practitioner through live audiovisual communication technology and the disclosure of certain medical information by electronic transmission. I acknowledge that I have been given the opportunity to request an in-person assessment or other available alternative prior to the telemedicine visit and am voluntarily participating in the telemedicine visit.  I understand that I have the right to withhold or withdraw my consent to the use of telemedicine in the course of my care at any time, without affecting my right to future care or treatment, and that the Practitioner or I may terminate the telemedicine visit at any time. I understand that I have the right to inspect all information obtained and/or recorded in the course of the telemedicine visit and may receive copies of available information for a reasonable fee.  I understand that some of the potential risks of receiving the Services via telemedicine include:  Delay or interruption in medical evaluation due to technological equipment failure or disruption; Information  transmitted may not be sufficient (e.g. poor resolution of images) to allow for appropriate medical decision making by the Practitioner; and/or  In rare instances, security protocols could fail, causing a breach of personal health information.  Furthermore, I acknowledge that it is my responsibility to provide information about my medical history, conditions and care that is complete and accurate to the best of my ability. I acknowledge that Practitioner's advice, recommendations, and/or decision may be based on factors not within their control, such as incomplete or inaccurate data provided by me or distortions of diagnostic images or specimens that may result from electronic transmissions. I understand that the practice of medicine is not an exact science and that Practitioner makes no warranties or guarantees regarding treatment outcomes. I acknowledge that a copy of this consent can be made available to me via my patient portal [facility] MyChart), or I can request a printed copy by calling the office of [facility].    I understand that my insurance will be billed for this visit.   I have read or had this consent read to me. I understand the contents of this consent, which adequately explains the benefits and risks of the Services being provided via telemedicine.  I have been provided ample opportunity to ask questions regarding this consent and the Services and have had my questions answered to my satisfaction. I give my informed consent for the services to be provided through the use of telemedicine in my medical care

## 2023-06-12 NOTE — Telephone Encounter (Signed)
   Name: Scott [name]  DOB: 03/05/1955  MRN: [id number]  Primary Cardiologist: None   Preoperative team, please contact this patient and set up a phone call appointment for further preoperative risk assessment. Please obtain consent and complete medication review. Thank you for your help.  I confirm that guidance regarding antiplatelet and oral anticoagulation therapy has been completed and, if necessary, noted below.  Per office protocol, patient can hold Eliquis  for 3 days prior to procedure.   Patient will not need bridging with Lovenox (enoxaparin) around procedure.  I also confirmed the patient resides in the state of [place] . As per [employer] Medical [employer] telemedicine laws, the patient must reside in the state in which the provider is licensed.   Francene Ing, Retha Cast, NP 06/12/2023, 3:58 PM [facility]

## 2023-06-12 NOTE — Telephone Encounter (Signed)
 Pt has been scheduled tele preop appt 06/19/23 as surgery not scheduled until cleared. Med rec and consent are done.

## 2023-06-16 ENCOUNTER — Telehealth: Payer: Self-pay | Admitting: Internal Medicine

## 2023-06-16 NOTE — Telephone Encounter (Signed)
 Copied from CRM [id number]. Topic: General - Other >> Jun 16, 2023 11:20 AM Albertha Alosa wrote: Reason for CRM: [facility] called in regarding clearance , stated they need it signed and most recent office notes

## 2023-06-18 NOTE — Telephone Encounter (Signed)
 Clearance and notes have been faxed today.

## 2023-06-18 NOTE — Progress Notes (Unsigned)
 Virtual Visit via Telephone Note   Because of Scott [name] co-morbid illnesses, he is at least at moderate risk for complications without adequate follow up.  This format is felt to be most appropriate for this patient at this time.  Due to technical limitations with video connection Web designer), today's appointment will be conducted as an audio only telehealth visit, and Scott [name] verbally agreed to proceed in this manner.   All issues noted in this document were discussed and addressed.  No physical exam could be performed with this format.  Evaluation Performed:  Preoperative cardiovascular risk assessment _____________   Date:  06/18/2023   Patient ID:  Scott [name], DOB 11-Mar-1955, MRN [id number] Patient Location:  Home Provider location:   Office  Primary Care Provider:  Roslyn Coombe, MD Primary Cardiologist:  None  Chief Complaint / Patient Profile   68 y.o. y/o male with a h/o paroxysmal atrial fibrillation, coronary artery disease, sick sinus syndrome who is pending Right total hip arthroplasty and presents today for telephonic preoperative cardiovascular risk assessment.  History of Present Illness    Scott [name] is a 68 y.o. male who presents via audio/video conferencing for a telehealth visit today.  Pt was last seen in cardiology clinic on 08/18/2022 by Dr. Micael Adas.  At that time Scott [name] was doing well .  The patient is now pending procedure as outlined above. Since his last visit, he continues to be stable from a cardiac standpoint.  Today he denies chest pain, shortness of breath, lower extremity edema, fatigue, palpitations, melena, hematuria, hemoptysis, diaphoresis, weakness, presyncope, syncope, orthopnea, and PND.   Past Medical History    Past Medical History:  Diagnosis Date   ALLERGIC RHINITIS 01/14/2007   Allergy    ANXIETY 09/28/2006   Asbestos exposure    Atrial fibrillation (HCC) 09/28/2006   s/p afib ablation x 2    CHF (congestive heart failure) (HCC)    CKD (chronic kidney disease), stage III (HCC)    patient denies   COPD (chronic obstructive pulmonary disease) (HCC)    Coronary artery disease 11/24/2011   Diastolic dysfunction 09/28/2006   Dyspnea    W/ EXERTION    Dysrhythmia    AFIB      First degree AV block 10/12/2018   Noted on EKG   Gallstones    Hyperglycemia    HYPERLIPIDEMIA 09/28/2006   HYPERTENSION 09/28/2006   HYPOTHYROIDISM, ACQUIRED NEC 09/28/2006   pt denies and doesn't know   Long term current use of anticoagulant 04/08/2010   Obese    OBSTRUCTIVE SLEEP APNEA 01/06/2008   NOT USING     Right knee DJD 07/09/2010   S/P CABG x 1 11/26/2011   Right internal mammary artery to right coronary artery   S/P Maze operation for atrial fibrillation 11/26/2011   complete biatrial lesion set with clipping of LA appendage   Sleep apnea    Past Surgical History:  Procedure Laterality Date   ANTERIOR CERVICAL DECOMP/DISCECTOMY FUSION N/A 06/20/2016   Procedure: Anterior Cervical Discectomy Fusion - Cervical five-Cervical six - Cervical six-Cervical seven - Cervicla seven- Thoracic one;  Surgeon: Agustina Aldrich, MD;  Location: [facility] OR;  Service: Neurosurgery;  Laterality: N/A;   CARDIOVERSION     X4    CORONARY ARTERY BYPASS GRAFT  11/26/2011   Procedure: CORONARY ARTERY BYPASS GRAFTING (CABG);  Surgeon: Gardenia Jump, MD;  Location: [facility] OR;  Service: Open Heart Surgery;  Laterality: N/A;  coronary artery bypass on pump times one utilizing the right internal mammary artery, transesophageal echocardiogram    HAND SURGERY Left    KNEE ARTHROSCOPY Right 12/10/2021   Procedure: RIGH TKNEE ARTHROSCOPY WITH SCAR BAND EXCISION;  Surgeon: Dayne Even, MD;  Location: [facility] ORS;  Service: Orthopedics;  Laterality: Right;   LEFT HEART CATHETERIZATION WITH CORONARY ANGIOGRAM N/A 11/24/2011   Procedure: LEFT HEART CATHETERIZATION WITH CORONARY ANGIOGRAM;  Surgeon: Peter M Swaziland, MD;  Location: [facility] CATH LAB;   Service: Cardiovascular;  Laterality: N/A;   LOOP RECORDER IMPLANT N/A 07/06/2012   Procedure: LOOP RECORDER IMPLANT;  Surgeon: Jolly Needle, MD;  Location: [facility] CATH LAB;  Service: Cardiovascular;  Laterality: N/A;   MAZE  11/26/2011   Procedure: MAZE;  Surgeon: Gardenia Jump, MD;  Location: [facility] OR;  Service: Open Heart Surgery;  Laterality: N/A;  Cryomaze    Radiofrequency Ablation for atrial fibrillation  12/30/07 and 06/05/09   afib ablation x 2 by JA   Radiofrequency ablation for atrial flutter  2005   CTI   TOTAL KNEE ARTHROPLASTY Right 04/05/2019   Procedure: RIGHT TOTAL KNEE ARTHROPLASTY;  Surgeon: Dayne Even, MD;  Location: [facility] ORS;  Service: Orthopedics;  Laterality: Right;    Allergies  Allergies  Allergen Reactions   Zolpidem Tartrate Other (See Comments)    Extremely drowsy the next day    Home Medications    Prior to Admission medications   Medication Sig Start Date End Date Taking? Authorizing Provider  acetaminophen  (TYLENOL ) 500 MG tablet Take 1,000 mg by mouth every 6 (six) hours as needed for mild pain.     [provider]  albuterol  (VENTOLIN  HFA) 108 (90 Base) MCG/ACT inhaler TAKE 2 PUFFS BY MOUTH EVERY 6 HOURS AS NEEDED FOR WHEEZE OR SHORTNESS OF BREATH 03/04/23   Roslyn Coombe, MD  allopurinol  (ZYLOPRIM ) 100 MG tablet Take 1 tablet (100 mg total) by mouth daily. To lower uric acid and prevent gout 04/08/23   Roslyn Coombe, MD  ALPRAZolam  (XANAX ) 0.5 MG tablet TAKE 1 TABLET BY MOUTH TWICE A DAY AS NEEDED FOR ANXIETY 06/01/23   Roslyn Coombe, MD  amLODipine  (NORVASC ) 5 MG tablet Take 1 tablet (5 mg total) by mouth daily. 04/08/23 04/07/24  Roslyn Coombe, MD  cholecalciferol  (VITAMIN D ) 1000 units tablet Take 1,000 Units by mouth daily.    [provider]  clotrimazole -betamethasone  (LOTRISONE ) cream Apply 1 Application topically daily. 11/14/21   Roslyn Coombe, MD  co-enzyme Q-10 30 MG capsule Take 30 mg by mouth daily.    [provider]   colchicine  0.6 MG tablet Take 1 tablet (0.6 mg total) by mouth daily as needed (gout pain). 04/08/23   Roslyn Coombe, MD  cyclobenzaprine  (FLEXERIL ) 5 MG tablet Take 1 tablet (5 mg total) by mouth 3 (three) times daily as needed for muscle spasms. 01/01/21   Roslyn Coombe, MD  docusate sodium  (COLACE) 100 MG capsule Take 200 mg by mouth daily as needed for mild constipation.     [provider]  donepezil  (ARICEPT ) 5 MG tablet Take 1 tablet (5 mg total) by mouth at bedtime. 04/08/23   Roslyn Coombe, MD  ELIQUIS  5 MG TABS tablet TAKE 1 TABLET BY MOUTH TWICE A DAY 10/21/22   Roslyn Coombe, MD  Fluticasone -Umeclidin-Vilant (TRELEGY ELLIPTA ) 100-62.5-25 MCG/ACT AEPB Take 1 inhalation once daily 04/08/23   Roslyn Coombe, MD  furosemide  (LASIX ) 20 MG tablet TAKE 1 TABLET  BY MOUTH EVERY DAY AS NEEDED Patient taking differently: Take 20 mg by mouth daily. 07/04/21   Tammie Fall, MD  gabapentin  (NEURONTIN ) 100 MG capsule TAKE 1 CAPSULE BY MOUTH THREE TIMES A DAY 04/08/23   Roslyn Coombe, MD  hydrochlorothiazide  (MICROZIDE ) 12.5 MG capsule Take 1 capsule (12.5 mg total) by mouth daily. 04/08/23   Roslyn Coombe, MD  lovastatin  (MEVACOR ) 40 MG tablet Take 1 tablet (40 mg total) by mouth daily. 04/08/23   Roslyn Coombe, MD  metoprolol  succinate (TOPROL -XL) 50 MG 24 hr tablet TAKE 1 TABLETS once DAILY BY MOUTH 04/08/23   Roslyn Coombe, MD  Multiple Vitamin (MULTIVITAMIN WITH MINERALS) TABS tablet Take 1 tablet by mouth daily.     [provider]  olmesartan  (BENICAR ) 40 MG tablet Take 1 tablet (40 mg total) by mouth daily. 04/08/23   Roslyn Coombe, MD  spironolactone  (ALDACTONE ) 25 MG tablet TAKE 0.5 TABLETS BY MOUTH AT BEDTIME. 04/08/23   Roslyn Coombe, MD  temazepam  (RESTORIL ) 30 MG capsule TAKE 1 CAPSULE (30 MG TOTAL) BY MOUTH AT BEDTIME AS NEEDED. FOR SLEEP 06/01/23   Roslyn Coombe, MD  traMADol  (ULTRAM ) 50 MG tablet TAKE 1 TABLET BY MOUTH EVERY 6 HOURS AS NEEDED 01/19/23   Roslyn Coombe, MD     Physical Exam    Vital Signs:  Scott [name] does not have vital signs available for review today.  Given telephonic nature of communication, physical exam is limited. AAOx3. NAD. Normal affect.  Speech and respirations are unlabored.  Accessory Clinical Findings    None  Assessment & Plan    1.  Preoperative Cardiovascular Risk Assessment:  Procedure:   RIGHT TOTAL ANTERIOR HIP ARTHROPLASTY   Date of Surgery:  Clearance TBD                                  Surgeon:  DR. Dayne Even Surgeon's Group or Practice Name:  [facility] Phone number:  [phone] REBECCA LANG Fax number:  [phone]   Primary Cardiologist: Dr. Micael Adas  Chart reviewed as part of pre-operative protocol coverage. Given past medical history and time since last visit, based on ACC/AHA guidelines, Scott [name] would be at acceptable risk for the planned procedure without further cardiovascular testing.   His RCRI is moderate risk, 6.6% risk of major cardiac event.  He is able to complete greater than 4 METS of physical activity.  Patient was advised that if he develops new symptoms prior to surgery to contact our office to arrange a follow-up appointment.  He verbalized understanding.  Per office protocol, patient can hold Eliquis  for 3 days prior to procedure.   Patient will not need bridging with Lovenox (enoxaparin) around procedure.  I will route this recommendation to the requesting party via Epic fax function and remove from pre-op pool.       Time:   Today, I have spent 9 minutes with the patient with telehealth technology discussing medical history, symptoms, and management plan.  I spent 10 minutes reviewing his cardiac medications, cardiac testing, and medical history.   Scott Charity, Scott [name]  06/18/2023, 1:27 PM

## 2023-06-19 ENCOUNTER — Ambulatory Visit: Attending: Cardiology

## 2023-06-19 DIAGNOSIS — Z01810 Encounter for preprocedural cardiovascular examination: Secondary | ICD-10-CM | POA: Diagnosis not present

## 2023-06-26 ENCOUNTER — Other Ambulatory Visit: Payer: Self-pay | Admitting: Orthopaedic Surgery

## 2023-06-30 ENCOUNTER — Other Ambulatory Visit: Payer: Self-pay | Admitting: Internal Medicine

## 2023-07-14 ENCOUNTER — Ambulatory Visit: Admit: 2023-07-14 | Admitting: Orthopaedic Surgery

## 2023-07-14 SURGERY — ARTHROPLASTY, HIP, TOTAL, ANTERIOR APPROACH
Anesthesia: Spinal | Site: Hip | Laterality: Right

## 2023-07-21 ENCOUNTER — Ambulatory Visit: Payer: PPO | Admitting: Neurology

## 2023-07-28 ENCOUNTER — Ambulatory Visit: Admitting: Neurology

## 2023-07-28 ENCOUNTER — Encounter: Payer: Self-pay | Admitting: Neurology

## 2023-07-28 VITALS — BP 122/71 | HR 68 | Ht 66.0 in | Wt 222.8 lb

## 2023-07-28 DIAGNOSIS — G479 Sleep disorder, unspecified: Secondary | ICD-10-CM | POA: Diagnosis not present

## 2023-07-28 DIAGNOSIS — Z711 Person with feared health complaint in whom no diagnosis is made: Secondary | ICD-10-CM

## 2023-07-28 DIAGNOSIS — Z79899 Other long term (current) drug therapy: Secondary | ICD-10-CM

## 2023-07-28 DIAGNOSIS — R6889 Other general symptoms and signs: Secondary | ICD-10-CM | POA: Diagnosis not present

## 2023-07-28 DIAGNOSIS — G4733 Obstructive sleep apnea (adult) (pediatric): Secondary | ICD-10-CM | POA: Diagnosis not present

## 2023-07-28 NOTE — Patient Instructions (Signed)
 It was nice to meet you today.  You have complaints of memory loss: memory loss or changes in cognitive function can have many reasons and does not always mean you have dementia.  There are several conditions and situations that can contribute to subjective or objective memory loss.  These factors include: depression, stress, sleep deprivation or poor sleep from insomnia or sleep apnea, dehydration, fluctuation in blood sugar values, thyroid  or electrolyte dysfunction, medication effects from sedating medications or narcotic pain medication for example and certain vitamin deficiencies such as vitamin B12 deficiency, and anemia. Dementia can be caused by stroke, brain atherosclerosis or brain vascular disease due to vascular risk factors (smoking, high blood pressure, high cholesterol, obesity and uncontrolled diabetes), certain degenerative brain disorders (including Parkinson's disease and Multiple sclerosis) and by Alzheimer's disease or other, more rare and sometimes hereditary causes.   Here is what I would recommend:   blood work (which we will do today), to check your genetic risk for Alzheimer's disease. We will request a formal cognitive test called neuropsychological evaluation which is done by a licensed neuropsychologist. We will make a referral in that regard.  I recommend you talk to your doctor's about potentially lowering or eliminating certain medications including Xanax , temazepam  and gabapentin  as well as Flexeril  if possible.  These medication's can potentially impact your cognitive function adversely.  Continue to use your CPAP or AutoPap machine consistently and follow up with your sleep specialist regularly.  We will plan to follow-up after you have seen the neuropsychologist.

## 2023-07-28 NOTE — Progress Notes (Signed)
 Subjective:    Patient ID: Scott [name] is a 68 y.o. male.  HPI    Scott Fairy, MD, PhD [facility] [address], [address] [address] [place], [place] [address]  Dear Dr. Autry [name],  I saw your patient, Scott [name], upon your kind request in my neurologic clinic today for initial consultation of his memory loss.  The patient is unaccompanied today.  As you know, Scott [name] is a 68 year old male with an underlying complex medical history of hypertension, hyperglycemia, hyperlipidemia, chronic kidney disease, chest pain, vitamin D  deficiency, B12 deficiency, bradycardia, osteoarthritis, allergies, anxiety, coronary artery disease with status post CABG in 2013, atrial fibrillation with status post cardioversion x 4, ablation, on chronic anticoagulation, congestive heart failure, COPD, OSA, obesity, status post multiple surgeries including right total knee replacement in 2021 and subsequent arthroscopic right knee surgery in 2023, A-fib ablation in 2009 in 2011, maze procedure in 2013, loop recorder implantation in 2014, status post ACDF in 2018, who reports an approximately 2-year history of fullness.  He denies any episodes of confusion or dysuria station.  He drives a car.  He also drives a motor bike for fun.  He lives alone, has 2 dogs in his household.  He does not have any children.  He reports a family history of dementia affecting his and paternal grandfather.  He not sure if they had Alzheimer's.  His father lived to be 36 or 30, mom died of a stroke in her 50s.  He has 1 younger sister who does not have any memory loss.  He tries to stay [facility] on [facility] and strength training.  He tries to hydrate well with water and estimates that he drinks about 4 or 5 bottles of water per day, 16.9 ounce size each.  He does not drink any alcohol, he quit smoking about 20+ years ago.  He is compliant with his PAP machine.  He walks his  dogs daily, weather permitting.  He denies any sudden onset of one-sided weakness or numbness or tingling or droopy face or slurring of speech.  I reviewed your office note from 04/08/2023.    He had extensive blood work at the time through your office which I reviewed in his electronic chart.  A1c was 5.8, ferritin level low at 19.2, vitamin D  level 41.1, total iron  binding capacity elevated at 488.6, B12 on the lower end of normal at 394, BUN 21, creatinine 1. 4 5, PSA normal, TSH normal at 3.1, CBC unremarkable, hepatic function panel normal, lipid panel benign with total cholesterol of 138, triglycerides 133, LDL 67.  He had a brain MRI without contrast on 04/24/2023 and I reviewed the results:   IMPRESSION: 1.  No evidence of an acute intracranial abnormality. 2. Minimal chronic small vessel ischemic changes within the cerebral white matter. 3. Nonspecific punctate chronic microhemorrhage within left frontal lobe. 4. Mild generalized cerebral atrophy. 5. Paranasal sinus disease as described. 6. Susceptibility artifact at the level of the cervical spine, presumably arising from ACDF hardware. Incompletely assessed cervical spondylosis. Vertebral ankylosis at C3-C4, and possibly C4-C5.   In addition, I personally and independently reviewed images through the PACS system. Of note, he is followed by multiple specialists including cardiology, orthopedics, sports medicine, and he had seen pulmonology in the past for his sleep apnea.  He then started seeing cardiology for his sleep apnea.  He has a history of severe obstructive  sleep apnea.  He is supposed to have a right hip replacement soon.  Of note, he is on multiple medications including potentially sedating medications including temazepam , Xanax , and gabapentin .  He is not sure why he takes gabapentin .  He is also on Flexeril  as needed.  He takes Xanax  at night and temazepam  at night.  He is on gabapentin  100 mg 3 times daily and also  takes donepezil  5 mg daily.  He is not sure along has been on it.  His Past Medical History Is Significant For: Past Medical History:  Diagnosis Date   ALLERGIC RHINITIS 01/14/2007   Allergy    ANXIETY 09/28/2006   Asbestos exposure    Atrial fibrillation (HCC) 09/28/2006   s/p afib ablation x 2   CHF (congestive heart failure) (HCC)    CKD (chronic kidney disease), stage III (HCC)    patient denies   COPD (chronic obstructive pulmonary disease) (HCC)    Coronary artery disease 11/24/2011   Diastolic dysfunction 09/28/2006   Dyspnea    W/ EXERTION    Dysrhythmia    AFIB      First degree AV block 10/12/2018   Noted on EKG   Gallstones    Hyperglycemia    HYPERLIPIDEMIA 09/28/2006   HYPERTENSION 09/28/2006   HYPOTHYROIDISM, ACQUIRED NEC 09/28/2006   pt denies and doesn't know   Long term current use of anticoagulant 04/08/2010   Obese    OBSTRUCTIVE SLEEP APNEA 01/06/2008   NOT USING     Right knee DJD 07/09/2010   S/P CABG x 1 11/26/2011   Right internal mammary artery to right coronary artery   S/P Maze operation for atrial fibrillation 11/26/2011   complete biatrial lesion set with clipping of LA appendage   Sleep apnea     His Past Surgical History Is Significant For: Past Surgical History:  Procedure Laterality Date   ANTERIOR CERVICAL DECOMP/DISCECTOMY FUSION N/A 06/20/2016   Procedure: Anterior Cervical Discectomy Fusion - Cervical five-Cervical six - Cervical six-Cervical seven - Cervicla seven- Thoracic one;  Surgeon: Scott Aldrich, MD;  Location: [facility] OR;  Service: Neurosurgery;  Laterality: N/A;   CARDIOVERSION     X4    CORONARY ARTERY BYPASS GRAFT  11/26/2011   Procedure: CORONARY ARTERY BYPASS GRAFTING (CABG);  Surgeon: Scott Jump, MD;  Location: [facility] OR;  Service: Open Heart Surgery;  Laterality: N/A;  coronary artery bypass on pump times one utilizing the right internal mammary artery, transesophageal echocardiogram    HAND SURGERY Left    KNEE ARTHROSCOPY Right  12/10/2021   Procedure: RIGH TKNEE ARTHROSCOPY WITH SCAR BAND EXCISION;  Surgeon: Scott Even, MD;  Location: [facility] ORS;  Service: Orthopedics;  Laterality: Right;   LEFT HEART CATHETERIZATION WITH CORONARY ANGIOGRAM N/A 11/24/2011   Procedure: LEFT HEART CATHETERIZATION WITH CORONARY ANGIOGRAM;  Surgeon: Peter M Swaziland, MD;  Location[facility] CATH LAB;  Service: Cardiovascular;  Laterality: N/A;   LOOP RECORDER IMPLANT N/A 07/06/2012   Procedure: LOOP RECORDER IMPLANT;  Surgeon: Jolly Needle, MD;  Location: [facility] CATH LAB;  Service: Cardiovascular;  Laterality: N/A;   MAZE  11/26/2011   Procedure: MAZE;  Surgeon: Scott Jump, MD;  Location: [facility] OR;  Service: Open Heart Surgery;  Laterality: N/A;  Cryomaze    Radiofrequency Ablation for atrial fibrillation  12/30/07 and 06/05/09   afib ablation x 2 by [facility]   Radiofrequency ablation for atrial flutter  2005   CTI   TOTAL KNEE ARTHROPLASTY Right 04/05/2019   Procedure: RIGHT  TOTAL KNEE ARTHROPLASTY;  Surgeon: Scott Even, MD;  Location: [facility] ORS;  Service: Orthopedics;  Laterality: Right;    His Family History Is Significant For: Family History  Problem Relation Age of Onset   Dementia Father    Hypertension Father    Atrial fibrillation Mother    Stroke Mother    Dementia Paternal Grandfather    Cancer Paternal Grandmother        type unknown   Colon cancer Neg Hx    Esophageal cancer Neg Hx    Prostate cancer Neg Hx    Rectal cancer Neg Hx    Stomach cancer Neg Hx     His Social History Is Significant For: Social History   Socioeconomic History   Marital status: Single    Spouse name: Not on file   Number of children: 0   Years of education: Not on file   Highest education level: Not on file  Occupational History   Occupation: works at the [employer] part-time  Tobacco Use   Smoking status: Former    Current packs/day: 0.00    Average packs/day: 1.5 packs/day for 26.0 years (39.0 ttl pk-yrs)    Types: Cigarettes    Start date:  04/05/1969    Quit date: 04/06/1995    Years since quitting: 28.3   Smokeless tobacco: Never  Vaping Use   Vaping status: Never Used  Substance and Sexual Activity   Alcohol use: No    Alcohol/week: 0.0 standard drinks of alcohol   Drug use: No   Sexual activity: Yes  Other Topics Concern   Not on file  Social History Narrative   Pt lives alone in [place], [place].  Has 2 dogs   Retired    Teacher, early years/pre Strain: Low Risk  (03/02/2023)   Overall Financial Resource Strain (CARDIA)    Difficulty of Paying Living Expenses: Not very hard  Food Insecurity: No Food Insecurity (03/27/2023)   Hunger Vital Sign    Worried About Running Out of Food in the Last Year: Never true    Ran Out of Food in the Last Year: Never true  Transportation Needs: No Transportation Needs (03/27/2023)   PRAPARE - Administrator, Civil Service (Medical): No    Lack of Transportation (Non-Medical): No  Physical Activity: Sufficiently Active (03/02/2023)   Exercise Vital Sign    Days of Exercise per Week: 3 days    Minutes of Exercise per Session: 90 min  Stress: No Stress Concern Present (03/02/2023)   [employer] of Occupational Health - Occupational Stress Questionnaire    Feeling of Stress : Not at all  Social Connections: Socially Isolated (03/02/2023)   Social Connection and Isolation Panel [NHANES]    Frequency of Communication with Friends and Family: Once a week    Frequency of Social Gatherings with Friends and Family: Once a week    Attends Religious Services: Never    Database administrator or Organizations: No    Attends Engineer, structural: Never    Marital Status: Never married    His Allergies Are:  Allergies  Allergen Reactions   Zolpidem Tartrate Other (See Comments)    Extremely drowsy the next day  :   His Current Medications Are:  Outpatient Encounter Medications as of 07/28/2023  Medication Sig   acetaminophen  (TYLENOL ) 500 MG  tablet Take 1,000 mg by mouth every 6 (six) hours as needed for mild pain.  albuterol  (VENTOLIN  HFA) 108 (90 Base) MCG/ACT inhaler TAKE 2 PUFFS BY MOUTH EVERY 6 HOURS AS NEEDED FOR WHEEZE OR SHORTNESS OF BREATH   allopurinol  (ZYLOPRIM ) 100 MG tablet Take 1 tablet (100 mg total) by mouth daily. To lower uric acid and prevent gout   ALPRAZolam  (XANAX ) 0.5 MG tablet TAKE 1 TABLET BY MOUTH TWICE A DAY AS NEEDED FOR ANXIETY   amLODipine  (NORVASC ) 5 MG tablet Take 1 tablet (5 mg total) by mouth daily.   cholecalciferol  (VITAMIN D ) 1000 units tablet Take 1,000 Units by mouth daily.   clotrimazole -betamethasone  (LOTRISONE ) cream Apply 1 Application topically daily.   co-enzyme Q-10 30 MG capsule Take 30 mg by mouth daily.   colchicine  0.6 MG tablet Take 1 tablet (0.6 mg total) by mouth daily as needed (gout pain).   cyclobenzaprine  (FLEXERIL ) 5 MG tablet Take 1 tablet (5 mg total) by mouth 3 (three) times daily as needed for muscle spasms.   docusate sodium  (COLACE) 100 MG capsule Take 200 mg by mouth daily as needed for mild constipation.    donepezil  (ARICEPT ) 5 MG tablet Take 1 tablet (5 mg total) by mouth at bedtime.   ELIQUIS  5 MG TABS tablet TAKE 1 TABLET BY MOUTH TWICE A DAY   Fluticasone -Umeclidin-Vilant (TRELEGY ELLIPTA ) 100-62.5-25 MCG/ACT AEPB Take 1 inhalation once daily   furosemide  (LASIX ) 20 MG tablet TAKE 1 TABLET BY MOUTH EVERY DAY AS NEEDED (Patient taking differently: Take 20 mg by mouth daily.)   gabapentin  (NEURONTIN ) 100 MG capsule TAKE 1 CAPSULE BY MOUTH THREE TIMES A DAY   hydrochlorothiazide  (MICROZIDE ) 12.5 MG capsule Take 1 capsule (12.5 mg total) by mouth daily.   lovastatin  (MEVACOR ) 40 MG tablet Take 1 tablet (40 mg total) by mouth daily.   metoprolol  succinate (TOPROL -XL) 50 MG 24 hr tablet TAKE 1 TABLETS once DAILY BY MOUTH   Multiple Vitamin (MULTIVITAMIN WITH MINERALS) TABS tablet Take 1 tablet by mouth daily.    olmesartan  (BENICAR ) 40 MG tablet Take 1 tablet (40 mg  total) by mouth daily.   spironolactone  (ALDACTONE ) 25 MG tablet TAKE 0.5 TABLETS BY MOUTH AT BEDTIME.   temazepam  (RESTORIL ) 30 MG capsule TAKE 1 CAPSULE (30 MG TOTAL) BY MOUTH AT BEDTIME AS NEEDED. FOR SLEEP   traMADol  (ULTRAM ) 50 MG tablet TAKE 1 TABLET BY MOUTH EVERY 6 HOURS AS NEEDED   No facility-administered encounter medications on file as of 07/28/2023.  :   Review of Systems:  Out of a complete 14 point review of systems, all are reviewed and negative with the exception of these symptoms as listed below:   Review of Systems  Neurological:        Pt here for memory changes Pt states decline in short and long term memory     Objective:  Neurological Exam  Physical Exam Physical Examination:   Vitals:   07/28/23 1003  BP: 122/71  Pulse: 68    General Examination: The patient is a very pleasant 68 y.o. male in no acute distress. He appears well-developed and well-nourished and well groomed.   HEENT: Normocephalic, atraumatic, pupils are equal, round and reactive to light, extraocular tracking is good without limitation to gaze excursion or nystagmus noted. Hearing is grossly intact. Face is symmetric with normal facial animation. Speech is clear with no dysarthria noted. There is no hypophonia. There is no lip, neck/head, jaw or voice tremor. Neck is supple with full range of passive and active motion. There are no carotid bruits on auscultation. Oropharynx exam  reveals: moderate mouth dryness, adequate dental hygiene and moderate airway crowding. Tongue protrudes centrally and palate elevates symmetrically.    Chest: Clear to auscultation without wheezing, rhonchi or crackles noted.  Heart: S1+S2+0, regular and normal without murmurs, rubs or gallops noted.   Abdomen: Soft, non-tender and non-distended.  Extremities: There is trace pitting edema in the distal lower extremities bilaterally, left more noticeable.  Varicose veins right lower extremity distally.  Skin: Warm  and dry without trophic changes noted.   Musculoskeletal: exam reveals unremarkable knee replacement scar on the right, limited range of motion right lower extremity in the hip.    Neurologically:  Mental status: The patient is awake, alert and oriented in all 4 spheres. His immediate and remote memory, attention, language skills and fund of knowledge are fairly appropriate.     07/28/2023   10:10 AM 10/09/2017   11:17 AM 03/21/2016    2:55 PM  MMSE - Mini Mental State Exam  Not completed:  Refused   Orientation to time 5  4  Orientation to Place 5  5  Registration 3  3  Attention/ Calculation 1  5  Recall 2  2  Language- name 2 objects 2  2  Language- repeat 1  1  Language- follow 3 step command 3  3  Language- read & follow direction 1  1  Write a sentence 0  1  Copy design 0  1  Total score 23  28    On 07/28/2023: CDT: 4/4, AFT: 14/min.  There is no evidence of aphasia, agnosia, apraxia or anomia. Speech is clear with normal prosody and enunciation. Thought process is linear. Mood is normal and affect is normal.  Cranial nerves II - XII are as described above under HEENT exam.  Motor exam: Normal bulk, strength and tone is noted, with the exception of limitation in the right lower extremity due to hip pain. There is no obvious action or resting tremor.  Fine motor skills and coordination: grossly intact.  Cerebellar testing: No dysmetria or intention tremor. There is no truncal or gait ataxia.  Sensory exam: intact to light touch in the upper and lower extremities.  Gait, station and balance: He stands with mild difficulty, pushes himself, but requires no assistance.  He walks without a walking aid with a limp on the right.    Assessment and Plan:  In summary, Scott [name] is a very pleasant 68 y.o.-year old male with an underlying complex medical history of hypertension, hyperglycemia, hyperlipidemia, chronic kidney disease, chest pain, vitamin D  deficiency, B12  deficiency, bradycardia, osteoarthritis, allergies, anxiety, coronary artery disease with status post CABG in 2013, atrial fibrillation with status post cardioversion x 4, ablation, on chronic anticoagulation, congestive heart failure, COPD, OSA, obesity, status post multiple surgeries including right total knee replacement in 2021 and subsequent arthroscopic right knee surgery in 2023, A-fib ablation in 2009 in 2011, maze procedure in 2013, loop recorder implantation in 2014, status post ACDF in 2018, who presents for evaluation of his forgetfulness of approximately 2 years duration.  He reports a family history of dementia.  He himself has multiple vascular risk factors.  We talked about his dementia risk at length today.  He is already on donepezil  and you can consider increasing this to 10 mg daily at this point.  He had recent blood work and a recent brain MRI already.  I would like to refer him for neuropsychology evaluation for a formal cognitive test.  He is  advised to stay well-hydrated and well rested and use his CPAP regularly and exercise on a regular basis.  He is advised to talk to you about potentially coming off of certain medications including temazepam , Xanax , Flexeril  and gabapentin  as these medications could potentially adversely impact his cognitive function.  We talked about the risk for polypharmacy.   We will go ahead and proceed with laboratory testing to look at his disease risk markers.  We will keep him posted as to his blood test results by phone call for now and plan to follow-up after cognitive testing.  I answered all his questions today and he was in agreement.  This was an extended visit of over 60 minutes with copious record review involved and considerable counseling and coordination of care.   Thank you very much for allowing me to participate in the care of this nice patient. If I can be of any further assistance to you please do not hesitate to call me at  [phone].  Sincerely,   Scott Fairy, MD, PhD

## 2023-08-04 ENCOUNTER — Ambulatory Visit: Payer: Self-pay | Admitting: Neurology

## 2023-08-04 DIAGNOSIS — G4733 Obstructive sleep apnea (adult) (pediatric): Secondary | ICD-10-CM

## 2023-08-04 DIAGNOSIS — Z79899 Other long term (current) drug therapy: Secondary | ICD-10-CM

## 2023-08-04 DIAGNOSIS — Z711 Person with feared health complaint in whom no diagnosis is made: Secondary | ICD-10-CM

## 2023-08-04 DIAGNOSIS — G479 Sleep disorder, unspecified: Secondary | ICD-10-CM

## 2023-08-04 DIAGNOSIS — R6889 Other general symptoms and signs: Secondary | ICD-10-CM

## 2023-08-04 NOTE — Progress Notes (Signed)
 Kindly inform the patient that  blood test for Alzheimer's risk suggests high likelihood of Alzheimer's related pathology in the brain

## 2023-08-11 ENCOUNTER — Telehealth: Payer: Self-pay

## 2023-08-11 ENCOUNTER — Ambulatory Visit: Admitting: Internal Medicine

## 2023-08-11 ENCOUNTER — Encounter: Payer: Self-pay | Admitting: Internal Medicine

## 2023-08-11 VITALS — BP 124/70 | HR 90 | Ht 66.0 in | Wt 223.0 lb

## 2023-08-11 DIAGNOSIS — Z860101 Personal history of adenomatous and serrated colon polyps: Secondary | ICD-10-CM | POA: Diagnosis not present

## 2023-08-11 DIAGNOSIS — Z8719 Personal history of other diseases of the digestive system: Secondary | ICD-10-CM

## 2023-08-11 DIAGNOSIS — K625 Hemorrhage of anus and rectum: Secondary | ICD-10-CM

## 2023-08-11 DIAGNOSIS — Z860100 Personal history of colon polyps, unspecified: Secondary | ICD-10-CM

## 2023-08-11 DIAGNOSIS — Z7901 Long term (current) use of anticoagulants: Secondary | ICD-10-CM

## 2023-08-11 LAB — APOE ALZHEIMER'S RISK

## 2023-08-11 LAB — ATN PROFILE
A -- Beta-amyloid 42/40 Ratio: 0.101 — ABNORMAL LOW (ref >0.102)
Beta-amyloid 40: 260.38 pg/mL
Beta-amyloid 42: 26.34 pg/mL
N -- NfL, Plasma: 3.83 pg/mL — ABNORMAL HIGH (ref 0.00–3.65)
T -- p-tau181: 2.21 pg/mL — ABNORMAL HIGH (ref 0.00–0.97)

## 2023-08-11 MED ORDER — HYDROCORTISONE (PERIANAL) 2.5 % EX CREA: 1.0000 | TOPICAL_CREAM | Freq: Every day | CUTANEOUS | 2 refills | Status: AC

## 2023-08-11 MED ORDER — NA SULFATE-K SULFATE-MG SULF 17.5-3.13-1.6 GM/177ML PO SOLN: 1.0000 | Freq: Once | ORAL | 0 refills | Status: AC

## 2023-08-11 NOTE — Patient Instructions (Addendum)
 We have sent the following medications to your pharmacy for you to pick up at your convenience:  Anusol HC cream.  Purchase over the counter Preparation H suppositories.  Apply a pea sized amount of the above cream and insert into there rectum at bedtime for two weeks.  You have been scheduled for a colonoscopy. Please follow written instructions given to you at your visit today.   If you use inhalers (even only as needed), please bring them with you on the day of your procedure.  DO NOT TAKE 7 DAYS PRIOR TO TEST- Trulicity (dulaglutide) Ozempic, Wegovy (semaglutide) Mounjaro (tirzepatide) Bydureon Bcise (exanatide extended release)  DO NOT TAKE 1 DAY PRIOR TO YOUR TEST Rybelsus (semaglutide) Adlyxin (lixisenatide) Victoza (liraglutide) Byetta (exanatide) _________________________________________________________________  Scott [name] can contact [facility] at [phone] to arrange for someone to drive you to your procedure, wait, and drive you home. The cost is approximately $100 (includes 4 hours), plus mileage. (Please note: Rates are subject to change)

## 2023-08-11 NOTE — Telephone Encounter (Signed)
 Error

## 2023-08-11 NOTE — Progress Notes (Signed)
 HISTORY OF PRESENT ILLNESS:  Scott [name] is a 68 y.o. male with multiple medical problems including prior coronary artery bypass surgery, atrial fibrillation on chronic Eliquis  therapy, COPD not requiring oxygen, and a history of CHF with last ejection fraction 53% (April 22, 2023), and adenomatous colon polyps.  He is sent today by his primary care provider regarding significant rectal bleeding.  The patient underwent complete colonoscopy May 03, 2020.  He was found to have 2 diminutive adenomatous colon polyps, left-sided diverticulosis, and moderate internal hemorrhoids.  Patient tells me that over the past year he has had intermittent significant rectal bleeding.  He shows me a picture from 2 weeks ago with red blood throughout the entire toilet bowl.  No associated rectal pain or change in bowel habits from baseline.  Bleeding concerns him and he is interested in resolution.  Review of blood work from April 08, 2023 shows ferritin level of 19.2.  Normal hemoglobin of 15.4.  REVIEW OF SYSTEMS:  All non-GI ROS negative unless otherwise stated in the HPI except for arthritis, back pain, hearing problems, sinus troubles, fatigue, shortness of breath  Past Medical History:  Diagnosis Date   ALLERGIC RHINITIS 01/14/2007   Allergy    ANXIETY 09/28/2006   Asbestos exposure    Atrial fibrillation (HCC) 09/28/2006   s/p afib ablation x 2   CHF (congestive heart failure) (HCC)    CKD (chronic kidney disease), stage III (HCC)    patient denies   COPD (chronic obstructive pulmonary disease) (HCC)    Coronary artery disease 11/24/2011   Diastolic dysfunction 09/28/2006   Dyspnea    W/ EXERTION    Dysrhythmia    AFIB      First degree AV block 10/12/2018   Noted on EKG   Gallstones    Hyperglycemia    HYPERLIPIDEMIA 09/28/2006   HYPERTENSION 09/28/2006   HYPOTHYROIDISM, ACQUIRED NEC 09/28/2006   pt denies and doesn't know   Long term current use of anticoagulant 04/08/2010   Obese     OBSTRUCTIVE SLEEP APNEA 01/06/2008   NOT USING     Right knee DJD 07/09/2010   S/P CABG x 1 11/26/2011   Right internal mammary artery to right coronary artery   S/P Maze operation for atrial fibrillation 11/26/2011   complete biatrial lesion set with clipping of LA appendage   Sleep apnea     Past Surgical History:  Procedure Laterality Date   ANTERIOR CERVICAL DECOMP/DISCECTOMY FUSION N/A 06/20/2016   Procedure: Anterior Cervical Discectomy Fusion - Cervical five-Cervical six - Cervical six-Cervical seven - Cervicla seven- Thoracic one;  Surgeon: Agustina Aldrich, MD;  Location: [facility] OR;  Service: Neurosurgery;  Laterality: N/A;   CARDIOVERSION     X4    CORONARY ARTERY BYPASS GRAFT  11/26/2011   Procedure: CORONARY ARTERY BYPASS GRAFTING (CABG);  Surgeon: Gardenia Jump, MD;  Location: [facility] OR;  Service: Open Heart Surgery;  Laterality: N/A;  coronary artery bypass on pump times one utilizing the right internal mammary artery, transesophageal echocardiogram    HAND SURGERY Left    KNEE ARTHROSCOPY Right 12/10/2021   Procedure: RIGH TKNEE ARTHROSCOPY WITH SCAR BAND EXCISION;  Surgeon: Dayne Even, MD;  Location: [facility] ORS;  Service: Orthopedics;  Laterality: Right;   LEFT HEART CATHETERIZATION WITH CORONARY ANGIOGRAM N/A 11/24/2011   Procedure: LEFT HEART CATHETERIZATION WITH CORONARY ANGIOGRAM;  Surgeon: Peter M Swaziland, MD;  Location: [facility] CATH LAB;  Service: Cardiovascular;  Laterality: N/A;   LOOP RECORDER IMPLANT N/A  07/06/2012   Procedure: LOOP RECORDER IMPLANT;  Surgeon: Jolly Needle, MD;  Location: [facility] CATH LAB;  Service: Cardiovascular;  Laterality: N/A;   MAZE  11/26/2011   Procedure: MAZE;  Surgeon: Gardenia Jump, MD;  Location: [facility] OR;  Service: Open Heart Surgery;  Laterality: N/A;  Cryomaze    Radiofrequency Ablation for atrial fibrillation  12/30/07 and 06/05/09   afib ablation x 2 by JA   Radiofrequency ablation for atrial flutter  2005   CTI   TOTAL KNEE ARTHROPLASTY Right 04/05/2019    Procedure: RIGHT TOTAL KNEE ARTHROPLASTY;  Surgeon: Dayne Even, MD;  Location: [facility] ORS;  Service: Orthopedics;  Laterality: Right;    Social History Kerwin Augustus  reports that he quit smoking about 28 years ago. His smoking use included cigarettes. He started smoking about 54 years ago. He has a 39 pack-year smoking history. He has never used smokeless tobacco. He reports that he does not drink alcohol and does not use drugs.  family history includes Atrial fibrillation in his mother; Cancer in his paternal grandmother; Dementia in his father and paternal grandfather; Hypertension in his father; Stroke in his mother.  Allergies  Allergen Reactions   Zolpidem Tartrate Other (See Comments)    Extremely drowsy the next day       PHYSICAL EXAMINATION: Vital signs: BP 124/70   Pulse 90   Ht 5' 6 (1.676 m)   Wt 223 lb (101.2 kg)   BMI 35.99 kg/m   Constitutional: generally well-appearing, no acute distress Psychiatric: alert and oriented x3, cooperative Eyes: extraocular movements intact, anicteric, conjunctiva pink Mouth: oral pharynx moist, no lesions Neck: supple no lymphadenopathy Cardiovascular: heart regular rate and rhythm, no murmur Lungs: clear to auscultation bilaterally Abdomen: soft, nontender, nondistended, no obvious ascites, no peritoneal signs, normal bowel sounds, no organomegaly Rectal: Deferred until colonoscopy Extremities: no clubbing, cyanosis.  Trace lower extremity edema bilaterally Skin: no lesions on visible extremities Neuro: No focal deficits.  Cranial nerves intact  ASSESSMENT:  1.  Significant recurrent rectal bleeding.  Suspect internal hemorrhoids. 2.  History of adenomatous colon polyps and diverticulosis on colonoscopy March 2022 3.  Multiple significant medical problems including atrial fibrillation on Eliquis    PLAN:  1.  Colonoscopy to evaluate rectal bleeding.  The patient is high risk given his comorbidities and the need to  address anticoagulation. 2.  If bleeding found to be secondary to internal hemorrhoids, then would recommend an office banding procedure with one of my partners who provide this service.  I discussed the procedure and provided him a copy of a brochure reviewing the same. 3.  Hold Eliquis  2 days prior to the procedure.  Risks and benefits of such were reviewed.  We will discuss this with his cardiologist, Dr. Carolynne Citron. 4.  Ongoing general medical care with PCP and other specialists Total time of 60 minutes was spent preparing to see the patient, obtaining comprehensive history, performing medically appropriate physical exam, counseling and educating the patient regarding the above listed issues, ordering colonoscopy, dressing anticoagulation, conferring with cardiology, and documenting clinical information in the health record

## 2023-08-11 NOTE — Telephone Encounter (Signed)
 Spoke to patient gave blood work results Gave Dr Alferd Angers recommendations . Patient aware that Neuropsychology is going to call him for appointment for formal memory test . Pt expressed understanding and thanked me for calling

## 2023-08-11 NOTE — Telephone Encounter (Signed)
-----   Message from [facility] B sent at 08/05/2023  2:07 PM EDT -----  ----- Message ----- From: Lisabeth Rider, MD Sent: 08/04/2023   5:07 PM EDT To: Gna-Pod 3 Results  Kindly inform the patient that  blood test for Alzheimer's risk suggests high likelihood of Alzheimer's related pathology in the brain

## 2023-08-11 NOTE — Telephone Encounter (Signed)
 [facility] Pre-operative Risk Assessment     Request for surgical clearance:     Endoscopy Procedure  What type of surgery is being performed?     Colonoscopy  When is this surgery scheduled?     09/17/2023  What type of clearance is required ?   Pharmacy  Are there any medications that need to be held prior to surgery and how long? Eliquis  - 2 days  Practice name and name of physician performing surgery?      [facility]  What is your office phone and fax number?      Phone- [phone]  Fax- [phone]  Anesthesia type (None, local, MAC, general) ?       MAC   Please route your response to [facility]

## 2023-08-11 NOTE — Telephone Encounter (Signed)
-----   Message from [facility] B sent at 08/05/2023  2:07 PM EDT -----  ----- Message ----- From: Lisabeth Rider, MD Sent: 08/04/2023   5:07 PM EDT To: Gna-Pod 3 Results  Kindly inform the patient that  blood test for Alzheimer's risk suggests high likelihood of Alzheimer's related pathology in the brain

## 2023-08-12 NOTE — Telephone Encounter (Signed)
 Pt scheduled for an office visit on 09/02/23.

## 2023-08-12 NOTE — Telephone Encounter (Signed)
   Name: Scott [name]  DOB: 1956-01-05  MRN: [id number]  Primary Cardiologist: None  Chart reviewed as part of pre-operative protocol coverage. Because of Scott [name]'s past medical history and time since last visit, he will require a follow-up in-office visit in order to better assess preoperative cardiovascular risk.  Pre-op covering staff: - Please schedule appointment and call patient to inform them. If patient already had an upcoming appointment within acceptable timeframe, please add pre-op clearance to the appointment notes so provider is aware. - Please contact requesting surgeon's office via preferred method (i.e, phone, fax) to inform them of need for appointment prior to surgery.  Warren Haber Kaleigha Chamberlin, PA  08/12/2023, 9:06 AM

## 2023-08-12 NOTE — Telephone Encounter (Signed)
 Patient with diagnosis of afib on Eliquis  for anticoagulation.    Procedure: colonoscopy  Date of procedure: 09/17/23   CHA2DS2-VASc Score = 4   This indicates a 4.8% annual risk of stroke. The patient's score is based upon: CHF History: 1 HTN History: 1 Diabetes History: 0 Stroke History: 0 Vascular Disease History: 1 Age Score: 1 Gender Score: 0     CrCl 71 mL/min  Platelet count 230    Per office protocol, patient can hold Eliquis  for 2 days prior to procedure.   Patient will not need bridging with Lovenox (enoxaparin) around procedure.  **This guidance is not considered finalized until pre-operative APP has relayed final recommendations.**

## 2023-08-14 ENCOUNTER — Telehealth: Payer: Self-pay

## 2023-08-14 NOTE — Telephone Encounter (Signed)
 Lm on vm that per Dr. Carolynne Citron, patient could hold her Eliquis  for 2 days prior to his procedure.  Asked that he call back to let me he got the information.

## 2023-08-14 NOTE — Telephone Encounter (Signed)
-----   Message from Legrand Puma sent at 08/13/2023  3:10 PM EDT ----- Thanks! ----- Message ----- From: Tammie Fall, MD Sent: 08/13/2023   2:48 PM EDT To: Tobin Forts, MD  John, that will be fine. If you have to do biopsies, I would consider holding the eliquis  a little longer after the procedure. GT ----- Message ----- From: Tobin Forts, MD Sent: 08/11/2023  12:46 PM EDT To: Tammie Fall, MD  Rudy Costain Pasco Bond will be undergoing colonoscopy to evaluate rectal bleeding.  He is on Eliquis  for history of A-fib. I would like to hold 2 days prior to his procedure with likely immediate resumption postprocedure, if okay with you. Thanks, Autry Legions

## 2023-08-14 NOTE — Telephone Encounter (Signed)
 The patient brought in Application for Renewal of Disability Parking Placard. This will be placed in Dr. Koleen Perna box.

## 2023-08-18 NOTE — Telephone Encounter (Signed)
 Placed on provider desk

## 2023-08-20 NOTE — Telephone Encounter (Signed)
 Called and let Pt know the form is ready for pick up.

## 2023-08-21 NOTE — Telephone Encounter (Signed)
 Spoke with patient and made sure he knew to hold his Eliquis  for 2 days prior to his procedure.  Patient agreed

## 2023-08-24 ENCOUNTER — Encounter: Payer: Self-pay | Admitting: Psychology

## 2023-08-30 NOTE — Progress Notes (Unsigned)
 Cardiology Office Note:    Date:  09/02/2023   ID:  Scott [name], DOB [birth year], MRN [id number]  PCP:  Norleen Lynwood ORN, MD   [facility] Providers Cardiologist:  None Sleep Medicine:  Wilbert Bihari, MD     Referring MD: Norleen Lynwood ORN, MD   Chief Complaint  Patient presents with   Pre-op Exam    CAD    History of Present Illness:    Scott [name] is a 68 y.o. male with a hx of PAF s/p multiple ablations and maze, CHF with recent LVEF 45-50%, tachycardia mediated cardiomyopathy, CKD 3, and CAD s/p CABG x 1.  He follows with Dr. Bihari for OSA and is on CPAP.  He has a history of atrial fibrillation and 2 prior ablations 2005 (flutter), 2009, 2011.  He has a remote history of tachycardia induced cardiomyopathy.  Last echocardiogram showed LVEF 45-50% with moderately reduced RV function and trivial AI.  He has a history of CAD and underwent CABG in 2013 including maze.  He was last seen by EP in 2023 and noted generally in normal sinus rhythm.  He underwent nuclear stress test 03/2023 that showed no findings consistent with ischemia, LVEF estimated 53%.  He was evaluated for preoperative risk evaluation prior to orthopedic surgery and initially cleared.  However surgery was canceled and he presents today for preoperative risk evaluation.  He exercises at the [facility] twice weekly including 30 min on the treadmill and walks 2 large dogs daily, just not as far due to heat.   Past Medical History:  Diagnosis Date   ALLERGIC RHINITIS 01/14/2007   Allergy    ANXIETY 09/28/2006   Asbestos exposure    Atrial fibrillation (HCC) 09/28/2006   s/p afib ablation x 2   CHF (congestive heart failure) (HCC)    CKD (chronic kidney disease), stage III (HCC)    patient denies   COPD (chronic obstructive pulmonary disease) (HCC)    Coronary artery disease 11/24/2011   Diastolic dysfunction 09/28/2006   Dyspnea    W/ EXERTION    Dysrhythmia    AFIB      First degree AV block 10/12/2018    Noted on EKG   Gallstones    Hyperglycemia    HYPERLIPIDEMIA 09/28/2006   HYPERTENSION 09/28/2006   HYPOTHYROIDISM, ACQUIRED NEC 09/28/2006   pt denies and doesn't know   Long term current use of anticoagulant 04/08/2010   Obese    OBSTRUCTIVE SLEEP APNEA 01/06/2008   NOT USING     Right knee DJD 07/09/2010   S/P CABG x 1 11/26/2011   Right internal mammary artery to right coronary artery   S/P Maze operation for atrial fibrillation 11/26/2011   complete biatrial lesion set with clipping of LA appendage   Sleep apnea     Past Surgical History:  Procedure Laterality Date   ANTERIOR CERVICAL DECOMP/DISCECTOMY FUSION N/A 06/20/2016   Procedure: Anterior Cervical Discectomy Fusion - Cervical five-Cervical six - Cervical six-Cervical seven - Cervicla seven- Thoracic one;  Surgeon: Victory Gunnels, MD;  Location: [facility] OR;  Service: Neurosurgery;  Laterality: N/A;   CARDIOVERSION     X4    CORONARY ARTERY BYPASS GRAFT  11/26/2011   Procedure: CORONARY ARTERY BYPASS GRAFTING (CABG);  Surgeon: Sudie VEAR Laine, MD;  Location: [facility] OR;  Service: Open Heart Surgery;  Laterality: N/A;  coronary artery bypass on pump times one utilizing the right internal mammary artery, transesophageal echocardiogram    HAND SURGERY  Left    KNEE ARTHROSCOPY Right 12/10/2021   Procedure: RIGH TKNEE ARTHROSCOPY WITH SCAR BAND EXCISION;  Surgeon: Sheril Coy, MD;  Location: [facility] ORS;  Service: Orthopedics;  Laterality: Right;   LEFT HEART CATHETERIZATION WITH CORONARY ANGIOGRAM N/A 11/24/2011   Procedure: LEFT HEART CATHETERIZATION WITH CORONARY ANGIOGRAM;  Surgeon: Peter M Swaziland, MD;  Location: [facility] CATH LAB;  Service: Cardiovascular;  Laterality: N/A;   LOOP RECORDER IMPLANT N/A 07/06/2012   Procedure: LOOP RECORDER IMPLANT;  Surgeon: Lynwood Rakers, MD;  Location: [facility] CATH LAB;  Service: Cardiovascular;  Laterality: N/A;   MAZE  11/26/2011   Procedure: MAZE;  Surgeon: Sudie VEAR Laine, MD;  Location: [facility] OR;  Service: Open Heart  Surgery;  Laterality: N/A;  Cryomaze    Radiofrequency Ablation for atrial fibrillation  12/30/07 and 06/05/09   afib ablation x 2 by JA   Radiofrequency ablation for atrial flutter  2005   CTI   TOTAL KNEE ARTHROPLASTY Right 04/05/2019   Procedure: RIGHT TOTAL KNEE ARTHROPLASTY;  Surgeon: Sheril Coy, MD;  Location: [facility] ORS;  Service: Orthopedics;  Laterality: Right;    Current Medications: Current Meds  Medication Sig   acetaminophen  (TYLENOL ) 500 MG tablet Take 1,000 mg by mouth every 6 (six) hours as needed for mild pain.    albuterol  (VENTOLIN  HFA) 108 (90 Base) MCG/ACT inhaler TAKE 2 PUFFS BY MOUTH EVERY 6 HOURS AS NEEDED FOR WHEEZE OR SHORTNESS OF BREATH   allopurinol  (ZYLOPRIM ) 100 MG tablet Take 1 tablet (100 mg total) by mouth daily. To lower uric acid and prevent gout   ALPRAZolam  (XANAX ) 0.5 MG tablet TAKE 1 TABLET BY MOUTH TWICE A DAY AS NEEDED FOR ANXIETY   amLODipine  (NORVASC ) 5 MG tablet Take 1 tablet (5 mg total) by mouth daily.   cholecalciferol  (VITAMIN D ) 1000 units tablet Take 1,000 Units by mouth daily.   clotrimazole -betamethasone  (LOTRISONE ) cream Apply 1 Application topically daily.   co-enzyme Q-10 30 MG capsule Take 30 mg by mouth daily.   colchicine  0.6 MG tablet Take 1 tablet (0.6 mg total) by mouth daily as needed (gout pain).   cyclobenzaprine  (FLEXERIL ) 5 MG tablet Take 1 tablet (5 mg total) by mouth 3 (three) times daily as needed for muscle spasms.   docusate sodium  (COLACE) 100 MG capsule Take 200 mg by mouth daily as needed for mild constipation.    donepezil  (ARICEPT ) 5 MG tablet Take 1 tablet (5 mg total) by mouth at bedtime.   ELIQUIS  5 MG TABS tablet TAKE 1 TABLET BY MOUTH TWICE A DAY   Fluticasone -Umeclidin-Vilant (TRELEGY ELLIPTA ) 100-62.5-25 MCG/ACT AEPB Take 1 inhalation once daily   furosemide  (LASIX ) 20 MG tablet TAKE 1 TABLET BY MOUTH EVERY DAY AS NEEDED   gabapentin  (NEURONTIN ) 100 MG capsule TAKE 1 CAPSULE BY MOUTH THREE TIMES A DAY    hydrochlorothiazide  (MICROZIDE ) 12.5 MG capsule Take 1 capsule (12.5 mg total) by mouth daily.   hydrocortisone  (ANUSOL -HC) 2.5 % rectal cream Place 1 Application rectally at bedtime.   lovastatin  (MEVACOR ) 40 MG tablet Take 1 tablet (40 mg total) by mouth daily.   metoprolol  succinate (TOPROL -XL) 50 MG 24 hr tablet TAKE 1 TABLETS once DAILY BY MOUTH   Multiple Vitamin (MULTIVITAMIN WITH MINERALS) TABS tablet Take 1 tablet by mouth daily.    olmesartan  (BENICAR ) 40 MG tablet Take 1 tablet (40 mg total) by mouth daily.   spironolactone  (ALDACTONE ) 25 MG tablet TAKE 0.5 TABLETS BY MOUTH AT BEDTIME.   temazepam  (RESTORIL ) 30 MG capsule TAKE  1 CAPSULE (30 MG TOTAL) BY MOUTH AT BEDTIME AS NEEDED. FOR SLEEP   traMADol  (ULTRAM ) 50 MG tablet TAKE 1 TABLET BY MOUTH EVERY 6 HOURS AS NEEDED     Allergies:   Zolpidem tartrate   Social History   Socioeconomic History   Marital status: Single    Spouse name: Not on file   Number of children: 0   Years of education: Not on file   Highest education level: Not on file  Occupational History   Occupation: works at the [employer] part-time  Tobacco Use   Smoking status: Former    Current packs/day: 0.00    Average packs/day: 1.5 packs/day for 26.0 years (39.0 ttl pk-yrs)    Types: Cigarettes    Start date: 04/05/1969    Quit date: 04/06/1995    Years since quitting: 28.4   Smokeless tobacco: Never  Vaping Use   Vaping status: Never Used  Substance and Sexual Activity   Alcohol use: No    Alcohol/week: 0.0 standard drinks of alcohol   Drug use: No   Sexual activity: Yes  Other Topics Concern   Not on file  Social History Narrative   Pt lives alone in [place], [place].  Has 2 dogs   Retired    Teacher, early years/pre Strain: Low Risk  (03/02/2023)   Overall Financial Resource Strain (CARDIA)    Difficulty of Paying Living Expenses: Not very hard  Food Insecurity: No Food Insecurity (03/27/2023)   Hunger Vital Sign     Worried About Running Out of Food in the Last Year: Never true    Ran Out of Food in the Last Year: Never true  Transportation Needs: No Transportation Needs (03/27/2023)   PRAPARE - Administrator, Civil Service (Medical): No    Lack of Transportation (Non-Medical): No  Physical Activity: Sufficiently Active (03/02/2023)   Exercise Vital Sign    Days of Exercise per Week: 3 days    Minutes of Exercise per Session: 90 min  Stress: No Stress Concern Present (03/02/2023)   [employer] of Occupational Health - Occupational Stress Questionnaire    Feeling of Stress : Not at all  Social Connections: Socially Isolated (03/02/2023)   Social Connection and Isolation Panel    Frequency of Communication with Friends and Family: Once a week    Frequency of Social Gatherings with Friends and Family: Once a week    Attends Religious Services: Never    Database administrator or Organizations: No    Attends Engineer, structural: Never    Marital Status: Never married     Family History: The patient's family history includes Atrial fibrillation in his mother; Cancer in his paternal grandmother; Dementia in his father and paternal grandfather; Hypertension in his father; Stroke in his mother. There is no history of Colon cancer, Esophageal cancer, Prostate cancer, Rectal cancer, or Stomach cancer.  ROS:   Please see the history of present illness.     All other systems reviewed and are negative.  EKGs/Labs/Other Studies Reviewed:    The following studies were reviewed today:  EKG Interpretation Date/Time:  Wednesday September 02 2023 14:00:40 EDT Ventricular Rate:  83 PR Interval:  254 QRS Duration:  116 QT Interval:  380 QTC Calculation: 446 R Axis:   91  Text Interpretation: Sinus rhythm with 1st degree A-V block Rightward axis When compared with ECG of 28-Nov-2021 08:59, Vent. rate has increased BY  38 BPM  QT has lengthened Confirmed by Madie Slough ([id number]) on 09/02/2023  2:23:12 PM    Recent Labs: 04/08/2023: ALT 28; BUN 21; Creatinine, Ser 1.45; Hemoglobin 15.4; Platelets 230.0; Potassium 4.5; Sodium 140; TSH 3.13  Recent Lipid Panel    Component Value Date/Time   CHOL 138 04/08/2023 0933   TRIG 133.0 04/08/2023 0933   HDL 44.30 04/08/2023 0933   CHOLHDL 3 04/08/2023 0933   VLDL 26.6 04/08/2023 0933   LDLCALC 67 04/08/2023 0933     Risk Assessment/Calculations:    CHA2DS2-VASc Score = 4   This indicates a 4.8% annual risk of stroke. The patient's score is based upon: CHF History: 1 HTN History: 1 Diabetes History: 0 Stroke History: 0 Vascular Disease History: 1 Age Score: 1 Gender Score: 0        Physical Exam:    VS:  BP 122/70 (BP Location: Left Arm, Patient Position: Sitting, Cuff Size: Large)   Pulse 83   Ht 5' 6 (1.676 m)   Wt 225 lb (102.1 kg)   SpO2 95%   BMI 36.32 kg/m     Wt Readings from Last 3 Encounters:  09/02/23 225 lb (102.1 kg)  08/11/23 223 lb (101.2 kg)  07/28/23 222 lb 12.8 oz (101.1 kg)     GEN:  Well nourished, well developed in no acute distress HEENT: Normal NECK: No JVD; No carotid bruits LYMPHATICS: No lymphadenopathy CARDIAC: RRR, no murmurs, rubs, gallops RESPIRATORY:  Clear to auscultation without rales, wheezing or rhonchi  ABDOMEN: Soft, non-tender, non-distended MUSCULOSKELETAL:  No edema; No deformity  SKIN: Warm and dry NEUROLOGIC:  Alert and oriented x 3 PSYCHIATRIC:  Normal affect   ASSESSMENT:    1. Preop cardiovascular exam   2. Coronary artery disease due to calcified coronary lesion   3. S/P CABG x 1   4. Paroxysmal atrial fibrillation (HCC)   5. Chronic anticoagulation   6. Chronic combined systolic (congestive) and diastolic (congestive) heart failure (HCC)   7. Essential hypertension   8. Hyperlipidemia with target LDL less than 70   9. COPD exacerbation (HCC)   10. OBSTRUCTIVE SLEEP APNEA   11. Preoperative cardiovascular examination    PLAN:    In order of problems  listed above:  CAD s/p CABG-2015 -Reassuring nuclear stress test February 2025 -No antiplatelets given Eliquis  - Continue beta-blocker and statin   PAF S/p multiple ablations and maze procedure at the time of sternotomy Chronic anticoagulation - Anticoagulated with Eliquis  5 mg twice daily, continue 50 mg Toprol    History of chronic systolic heart failure Moderate RV failure - Last echocardiogram 2023 with an LVEF 45 to 50% - Previously thought to have tachycardia induced cardiomyopathy -GDMT: 20 mg Lasix  daily, 12.5 mg HCTZ, 50 mg Toprol , 40 mg olmesartan , twelve 5 mg spironolactone  - Could consider combining olmesartan  and HCTZ to Entresto - will consider this after surgery - Question if he is a candidate for SGLT2i - again, consider this after his surgery   Hypertension - Managed in the context of CHF   Hyperlipidemia with LDL goal less than 70 - Maintained on 40 mg lovastatin    OSA - compliant on CPAP   Preoperative risk evaluation prior to orthopedic surgery According to the RCRI, he has a 6.6% risk of Mace. He remains active and can complete greater than 4.0 METS without angina. He has no unstable cardiac disease. No further cardiac testing needed. He may proceed with surgery.   Per office protocol, patient can hold Eliquis  for 2  days prior to procedure.            Medication Adjustments/Labs and Tests Ordered: Current medicines are reviewed at length with the patient today.  Concerns regarding medicines are outlined above.  Orders Placed This Encounter  Procedures   EKG 12-Lead   No orders of the defined types were placed in this encounter.   Patient Instructions  Medication Instructions:  Your physician recommends that you continue on your current medications as directed. Please refer to the Current Medication list given to you today.  *If you need a refill on your cardiac medications before your next appointment, please call your pharmacy*  Lab  Work: NONE ordered at this time of appointment   Testing/Procedures: NONE ordered at this time of appointment   Follow-Up: At [facility], you and your health needs are our priority.  As part of our continuing mission to provide you with exceptional heart care, our providers are all part of one team.  This team includes your primary Cardiologist (physician) and Advanced Practice Providers or APPs (Physician Assistants and Nurse Practitioners) who all work together to provide you with the care you need, when you need it.  Your next appointment:   1 year(s)  Provider:   Wilbert Bihari MD    We recommend signing up for the patient portal called MyChart.  Sign up information is provided on this After Visit Summary.  MyChart is used to connect with patients for Virtual Visits (Telemedicine).  Patients are able to view lab/test results, encounter notes, upcoming appointments, etc.  Non-urgent messages can be sent to your provider as well.   To learn more about what you can do with MyChart, go to [email or web address].          Signed, Jon Nat Hails, [place]  09/02/2023 2:43 PM    [facility]

## 2023-08-31 ENCOUNTER — Other Ambulatory Visit: Payer: Self-pay | Admitting: Internal Medicine

## 2023-09-02 ENCOUNTER — Encounter: Payer: Self-pay | Admitting: Physician Assistant

## 2023-09-02 ENCOUNTER — Ambulatory Visit: Attending: Cardiovascular Disease | Admitting: Physician Assistant

## 2023-09-02 VITALS — BP 122/70 | HR 83 | Ht 66.0 in | Wt 225.0 lb

## 2023-09-02 DIAGNOSIS — Z951 Presence of aortocoronary bypass graft: Secondary | ICD-10-CM | POA: Diagnosis not present

## 2023-09-02 DIAGNOSIS — I2584 Coronary atherosclerosis due to calcified coronary lesion: Secondary | ICD-10-CM | POA: Diagnosis not present

## 2023-09-02 DIAGNOSIS — I2510 Atherosclerotic heart disease of native coronary artery without angina pectoris: Secondary | ICD-10-CM | POA: Diagnosis not present

## 2023-09-02 DIAGNOSIS — E785 Hyperlipidemia, unspecified: Secondary | ICD-10-CM

## 2023-09-02 DIAGNOSIS — Z01810 Encounter for preprocedural cardiovascular examination: Secondary | ICD-10-CM | POA: Diagnosis not present

## 2023-09-02 DIAGNOSIS — I10 Essential (primary) hypertension: Secondary | ICD-10-CM

## 2023-09-02 DIAGNOSIS — Z7901 Long term (current) use of anticoagulants: Secondary | ICD-10-CM | POA: Diagnosis not present

## 2023-09-02 DIAGNOSIS — J441 Chronic obstructive pulmonary disease with (acute) exacerbation: Secondary | ICD-10-CM | POA: Diagnosis not present

## 2023-09-02 DIAGNOSIS — I5042 Chronic combined systolic (congestive) and diastolic (congestive) heart failure: Secondary | ICD-10-CM | POA: Diagnosis not present

## 2023-09-02 DIAGNOSIS — G4733 Obstructive sleep apnea (adult) (pediatric): Secondary | ICD-10-CM | POA: Diagnosis not present

## 2023-09-02 DIAGNOSIS — I480 Paroxysmal atrial fibrillation: Secondary | ICD-10-CM | POA: Diagnosis not present

## 2023-09-02 NOTE — Patient Instructions (Signed)
 Medication Instructions:  Your physician recommends that you continue on your current medications as directed. Please refer to the Current Medication list given to you today.  *If you need a refill on your cardiac medications before your next appointment, please call your pharmacy*  Lab Work: NONE ordered at this time of appointment   Testing/Procedures: NONE ordered at this time of appointment   Follow-Up: At [facility], you and your health needs are our priority.  As part of our continuing mission to provide you with exceptional heart care, our providers are all part of one team.  This team includes your primary Cardiologist (physician) and Advanced Practice Providers or APPs (Physician Assistants and Nurse Practitioners) who all work together to provide you with the care you need, when you need it.  Your next appointment:   1 year(s)  Provider:   Wilbert Bihari MD    We recommend signing up for the patient portal called MyChart.  Sign up information is provided on this After Visit Summary.  MyChart is used to connect with patients for Virtual Visits (Telemedicine).  Patients are able to view lab/test results, encounter notes, upcoming appointments, etc.  Non-urgent messages can be sent to your provider as well.   To learn more about what you can do with MyChart, go to [email or web address].

## 2023-09-16 ENCOUNTER — Telehealth: Payer: Self-pay

## 2023-09-16 NOTE — Telephone Encounter (Signed)
 Confirmation received.

## 2023-09-16 NOTE — Telephone Encounter (Signed)
 Clearance form from [facility] received, signed, and faxed over. This form was in regards to right total hip replacement. Awaiting confirmation

## 2023-09-17 ENCOUNTER — Encounter: Admitting: Internal Medicine

## 2023-09-17 ENCOUNTER — Other Ambulatory Visit: Payer: Self-pay | Admitting: Orthopaedic Surgery

## 2023-09-17 NOTE — Progress Notes (Signed)
 Sent message, via epic in basket, requesting orders in epic from Careers adviser.

## 2023-09-18 NOTE — Progress Notes (Addendum)
 COVID Vaccine Completed: yes  Date of COVID positive in last 90 days:  PCP - Lynwood Rush, MD Cardiologist - Wilbert Bihari, MD  Clearance in media tab dated 07/16/23  Cardiac clearance by Jon Hails, PA 09/02/23 in Epic  Chest x-ray - 09/21/23 Epic EKG - 09/02/23 Epic Stress Test - 04/22/23 Epic ECHO - 09/13/18 Epic Cardiac Cath - 11/24/11 Pacemaker/ICD device last checked: loop recorder 12/04/2015 Epic- dead Spinal Cord Stimulator: n/a  Bowel Prep - no  Sleep Study -yes  CPAP -  yes every night  Fasting Blood Sugar - n/a Checks Blood Sugar _____ times a day  Last dose of GLP1 agonist-  N/A GLP1 instructions:  Do not take after     Last dose of SGLT-2 inhibitors-  N/A SGLT-2 instructions:  Do not take after     Blood Thinner Instructions:  Eliquis , hold 2 days Aspirin  Instructions: Last Dose: 09/26/23 2300  Activity level: Can go up a flight of stairs and perform activities of daily living without stopping and without symptoms of chest pain. SOB with exertion, not new has hx COPD   Anesthesia review: HTN, a fib, CAD, CHF, OSA, COPD, CABG x1, creatinine 1.69  Patient denies shortness of breath, fever, cough and chest pain at PAT appointment  Patient verbalized understanding of instructions that were given to them at the PAT appointment. Patient was also instructed that they will need to review over the PAT instructions again at home before surgery.

## 2023-09-18 NOTE — Progress Notes (Signed)
 Please place orders for PAT appointment scheduled 09/21/23.

## 2023-09-18 NOTE — Patient Instructions (Addendum)
 SURGICAL WAITING ROOM VISITATION  Patients having surgery or a procedure may have no more than 2 support people in the waiting area - these visitors may rotate.    Children under the age of 63 must have an adult with them who is not the patient.  Visitors with respiratory illnesses are discouraged from visiting and should remain at home.  If the patient needs to stay at the hospital during part of their recovery, the visitor guidelines for inpatient rooms apply. Pre-op nurse will coordinate an appropriate time for 1 support person to accompany patient in pre-op.  This support person may not rotate.    Please refer to the [facility] website for the visitor guidelines for Inpatients (after your surgery is over and you are in a regular room).    Your procedure is scheduled on: 09/29/23   Report to [facility] Main Entrance    Report to admitting at 5:15 AM   Call this number if you have problems the morning of surgery [phone]   Do not eat food :After Midnight.   After Midnight you may have the following liquids until 4:30 AM DAY OF SURGERY  Water Non-Citrus Juices (without pulp, NO RED-Apple, White grape, White cranberry) Black Coffee (NO MILK/CREAM OR CREAMERS, sugar ok)  Clear Tea (NO MILK/CREAM OR CREAMERS, sugar ok) regular and decaf                             Plain Jell-O (NO RED)                                           Fruit ices (not with fruit pulp, NO RED)                                     Popsicles (NO RED)                                                               Sports drinks like Gatorade (NO RED)                 The day of surgery:  Drink ONE (1) Pre-Surgery Clear Ensure at 4:30 AM the morning of surgery. Drink in one sitting. Do not sip.  This drink was given to you during your hospital  pre-op appointment visit. Nothing else to drink after completing the  Pre-Surgery Clear Ensure.          If you have questions, please contact your surgeon's  office.   FOLLOW BOWEL PREP AND ANY ADDITIONAL PRE OP INSTRUCTIONS YOU RECEIVED FROM YOUR SURGEON'S OFFICE!!!     Oral Hygiene is also important to reduce your risk of infection.                                    Remember - BRUSH YOUR TEETH THE MORNING OF SURGERY WITH YOUR REGULAR TOOTHPASTE  DENTURES WILL BE REMOVED PRIOR TO SURGERY PLEASE DO NOT APPLY Poly grip OR  ADHESIVES!!!   Stop all vitamins and herbal supplements 7 days before surgery.   Take these medicines the morning of surgery with A SIP OF WATER: Tylenol , Inhalers, Allopurinol , Amlodipine , Gabapentin , Metoprolol , Tramadol    Last dose of Eliquis  09/26/23.  Bring CPAP mask and tubing day of surgery.                              You may not have any metal on your body including jewelry, and body piercing             Do not wear lotions, powders, cologne, or deodorant              Men may shave face and neck.   Do not bring valuables to the hospital. [place] IS NOT             RESPONSIBLE   FOR VALUABLES.   Contacts, glasses, dentures or bridgework may not be worn into surgery.  DO NOT BRING YOUR HOME MEDICATIONS TO THE HOSPITAL. PHARMACY WILL DISPENSE MEDICATIONS LISTED ON YOUR MEDICATION LIST TO YOU DURING YOUR ADMISSION IN THE HOSPITAL!    Patients discharged on the day of surgery will not be allowed to drive home.  Someone NEEDS to stay with you for the first 24 hours after anesthesia.              Please read over the following fact sheets you were given: IF YOU HAVE QUESTIONS ABOUT YOUR PRE-OP INSTRUCTIONS PLEASE CALL [phone].   If you received a COVID test during your pre-op visit  it is requested that you wear a mask when out in public, stay away from anyone that may not be feeling well and notify your surgeon if you develop symptoms. If you test positive for Covid or have been in contact with anyone that has tested positive in the last 10 days please notify you surgeon.      Pre-operative  5 CHG Bath Instructions   You can play a key role in reducing the risk of infection after surgery. Your skin needs to be as free of germs as possible. You can reduce the number of germs on your skin by washing with CHG (chlorhexidine  gluconate) soap before surgery. CHG is an antiseptic soap that kills germs and continues to kill germs even after washing.   DO NOT use if you have an allergy to chlorhexidine /CHG or antibacterial soaps. If your skin becomes reddened or irritated, stop using the CHG and notify one of our RNs at [phone].   Please shower with the CHG soap starting 4 days before surgery using the following schedule:     Please keep in mind the following:  DO NOT shave, including legs and underarms, starting the day of your first shower.   You may shave your face at any point before/day of surgery.  Place clean sheets on your bed the day you start using CHG soap. Use a clean washcloth (not used since being washed) for each shower. DO NOT sleep with pets once you start using the CHG.   CHG Shower Instructions:  If you choose to wash your hair and private area, wash first with your normal shampoo/soap.  After you use shampoo/soap, rinse your hair and body thoroughly to remove shampoo/soap residue.  Turn the water OFF and apply about 3 tablespoons (45 ml) of CHG soap to a CLEAN washcloth.  Apply CHG soap ONLY FROM YOUR NECK  DOWN TO YOUR TOES (washing for 3-5 minutes)  DO NOT use CHG soap on face, private areas, open wounds, or sores.  Pay special attention to the area where your surgery is being performed.  If you are having back surgery, having someone wash your back for you may be helpful. Wait 2 minutes after CHG soap is applied, then you may rinse off the CHG soap.  Pat dry with a clean towel  Put on clean clothes/pajamas   If you choose to wear lotion, please use ONLY the CHG-compatible lotions on the back of this paper.     Additional instructions for the day of  surgery: DO NOT APPLY any lotions, deodorants, cologne, or perfumes.   Put on clean/comfortable clothes.  Brush your teeth.  Ask your nurse before applying any prescription medications to the skin.      CHG Compatible Lotions   Aveeno Moisturizing lotion  Cetaphil Moisturizing Cream  Cetaphil Moisturizing Lotion  Clairol Herbal Essence Moisturizing Lotion, Dry Skin  Clairol Herbal Essence Moisturizing Lotion, Extra Dry Skin  Clairol Herbal Essence Moisturizing Lotion, Normal Skin  Curel Age Defying Therapeutic Moisturizing Lotion with Alpha Hydroxy  Curel Extreme Care Body Lotion  Curel Soothing Hands Moisturizing Hand Lotion  Curel Therapeutic Moisturizing Cream, Fragrance-Free  Curel Therapeutic Moisturizing Lotion, Fragrance-Free  Curel Therapeutic Moisturizing Lotion, Original Formula  Eucerin Daily Replenishing Lotion  Eucerin Dry Skin Therapy Plus Alpha Hydroxy Crme  Eucerin Dry Skin Therapy Plus Alpha Hydroxy Lotion  Eucerin Original Crme  Eucerin Original Lotion  Eucerin Plus Crme Eucerin Plus Lotion  Eucerin TriLipid Replenishing Lotion  Keri Anti-Bacterial Hand Lotion  Keri Deep Conditioning Original Lotion Dry Skin Formula Softly Scented  Keri Deep Conditioning Original Lotion, Fragrance Free Sensitive Skin Formula  Keri Lotion Fast Absorbing Fragrance Free Sensitive Skin Formula  Keri Lotion Fast Absorbing Softly Scented Dry Skin Formula  Keri Original Lotion  Keri Skin Renewal Lotion Keri Silky Smooth Lotion  Keri Silky Smooth Sensitive Skin Lotion  Nivea Body Creamy Conditioning Oil  Nivea Body Extra Enriched Teacher, adult education Moisturizing Lotion Nivea Crme  Nivea Skin Firming Lotion  NutraDerm 30 Skin Lotion  NutraDerm Skin Lotion  NutraDerm Therapeutic Skin Cream  NutraDerm Therapeutic Skin Lotion  ProShield Protective Hand Cream  Provon moisturizing lotion  WHAT IS A BLOOD TRANSFUSION? Blood Transfusion  Information  A transfusion is the replacement of blood or some of its parts. Blood is made up of multiple cells which provide different functions. Red blood cells carry oxygen and are used for blood loss replacement. White blood cells fight against infection. Platelets control bleeding. Plasma helps clot blood. Other blood products are available for specialized needs, such as hemophilia or other clotting disorders. BEFORE THE TRANSFUSION  Who gives blood for transfusions?  Healthy volunteers who are fully evaluated to make sure their blood is safe. This is blood bank blood. Transfusion therapy is the safest it has ever been in the practice of medicine. Before blood is taken from a donor, a complete history is taken to make sure that person has no history of diseases nor engages in risky social behavior (examples are intravenous drug use or sexual activity with multiple partners). The donor's travel history is screened to minimize risk of transmitting infections, such as malaria. The donated blood is tested for signs of infectious diseases, such as HIV and hepatitis. The blood is then tested to be sure it is compatible with you in order to  minimize the chance of a transfusion reaction. If you or a relative donates blood, this is often done in anticipation of surgery and is not appropriate for emergency situations. It takes many days to process the donated blood. RISKS AND COMPLICATIONS Although transfusion therapy is very safe and saves many lives, the main dangers of transfusion include:  Getting an infectious disease. Developing a transfusion reaction. This is an allergic reaction to something in the blood you were given. Every precaution is taken to prevent this. The decision to have a blood transfusion has been considered carefully by your caregiver before blood is given. Blood is not given unless the benefits outweigh the risks. AFTER THE TRANSFUSION Right after receiving a blood transfusion,  you will usually feel much better and more energetic. This is especially true if your red blood cells have gotten low (anemic). The transfusion raises the level of the red blood cells which carry oxygen, and this usually causes an energy increase. The nurse administering the transfusion will monitor you carefully for complications. HOME CARE INSTRUCTIONS  No special instructions are needed after a transfusion. You may find your energy is better. Speak with your caregiver about any limitations on activity for underlying diseases you may have. SEEK MEDICAL CARE IF:  Your condition is not improving after your transfusion. You develop redness or irritation at the intravenous (IV) site. SEEK IMMEDIATE MEDICAL CARE IF:  Any of the following symptoms occur over the next 12 hours: Shaking chills. You have a temperature by mouth above 102 F (38.9 C), not controlled by medicine. Chest, back, or muscle pain. People around you feel you are not acting correctly or are confused. Shortness of breath or difficulty breathing. Dizziness and fainting. You get a rash or develop hives. You have a decrease in urine output. Your urine turns a dark color or changes to pink, red, or brown. Any of the following symptoms occur over the next 10 days: You have a temperature by mouth above 102 F (38.9 C), not controlled by medicine. Shortness of breath. Weakness after normal activity. The white part of the eye turns yellow (jaundice). You have a decrease in the amount of urine or are urinating less often. Your urine turns a dark color or changes to pink, red, or brown. Document Released: 02/08/2000 Document Revised: 05/05/2011 Document Reviewed: 09/27/2007 [facility] Patient Information 2014 [place], [place].  _______________________________________________________________________

## 2023-09-21 ENCOUNTER — Other Ambulatory Visit: Payer: Self-pay

## 2023-09-21 ENCOUNTER — Ambulatory Visit (HOSPITAL_COMMUNITY)
Admission: RE | Admit: 2023-09-21 | Discharge: 2023-09-21 | Disposition: A | Discharge status: Home / Self Care | Source: Ambulatory Visit | Attending: Orthopaedic Surgery | Admitting: Orthopaedic Surgery

## 2023-09-21 ENCOUNTER — Encounter (HOSPITAL_COMMUNITY)
Admission: RE | Admit: 2023-09-21 | Discharge: 2023-09-21 | Disposition: A | Discharge status: Home / Self Care | Source: Ambulatory Visit | Attending: Orthopaedic Surgery | Admitting: Orthopaedic Surgery

## 2023-09-21 ENCOUNTER — Encounter (HOSPITAL_COMMUNITY): Payer: Self-pay

## 2023-09-21 VITALS — BP 131/78 | HR 90 | Temp 97.7°F | Resp 16 | Ht 66.0 in | Wt 222.0 lb

## 2023-09-21 DIAGNOSIS — I2510 Atherosclerotic heart disease of native coronary artery without angina pectoris: Secondary | ICD-10-CM | POA: Insufficient documentation

## 2023-09-21 DIAGNOSIS — Z01818 Encounter for other preprocedural examination: Secondary | ICD-10-CM | POA: Diagnosis not present

## 2023-09-21 DIAGNOSIS — I2584 Coronary atherosclerosis due to calcified coronary lesion: Secondary | ICD-10-CM | POA: Diagnosis not present

## 2023-09-21 LAB — CBC
HCT: 46 % (ref 39.0–52.0)
Hemoglobin: 15.4 g/dL (ref 13.0–17.0)
MCH: 32 pg (ref 26.0–34.0)
MCHC: 33.5 g/dL (ref 30.0–36.0)
MCV: 95.4 fL (ref 80.0–100.0)
Platelets: 207 K/uL (ref 150–400)
RBC: 4.82 MIL/uL (ref 4.22–5.81)
RDW: 13.1 % (ref 11.5–15.5)
WBC: 7.3 K/uL (ref 4.0–10.5)
nRBC: 0 % (ref 0.0–0.2)

## 2023-09-21 LAB — SURGICAL PCR SCREEN
MRSA, PCR: NEGATIVE
Staphylococcus aureus: NEGATIVE

## 2023-09-21 LAB — BASIC METABOLIC PANEL WITH GFR
Anion gap: 7 (ref 5–15)
BUN: 33 mg/dL — ABNORMAL HIGH (ref 8–23)
CO2: 25 mmol/L (ref 22–32)
Calcium: 9.1 mg/dL (ref 8.9–10.3)
Chloride: 107 mmol/L (ref 98–111)
Creatinine, Ser: 1.69 mg/dL — ABNORMAL HIGH (ref 0.61–1.24)
GFR, Estimated: 44 mL/min — ABNORMAL LOW (ref >60)
Glucose, Bld: 115 mg/dL — ABNORMAL HIGH (ref 70–99)
Potassium: 4.5 mmol/L (ref 3.5–5.1)
Sodium: 139 mmol/L (ref 135–145)

## 2023-09-21 NOTE — Progress Notes (Signed)
 Creatinine 1.69, results routed to Dr. Dalldorf

## 2023-09-23 ENCOUNTER — Encounter (HOSPITAL_COMMUNITY): Payer: Self-pay

## 2023-09-23 NOTE — Progress Notes (Addendum)
 Case: [id number] Date/Time: 09/29/23 0715   Procedure: ARTHROPLASTY, HIP, TOTAL, ANTERIOR APPROACH (Right: Hip)   Anesthesia type: Spinal   Pre-op diagnosis: RIGHT HIP DEGENERATIVE JOINT DISEASE   Location[facility] ROOM 06 / [facility]   Surgeons: Sheril Coy, MD       DISCUSSION: Scott [name] is a 68 yo male with PMH of former smoking, HTN, A.fib s/p multiple ablations, CAD s/p CABG x 1 and maze surgery (2013), NICM, COPD, OSA (uses CPAP), mild cognitive impairment, CKD stage 3, arthritis s/p ACDF C5-T1 (2018)  Patient follows with Cardiology for A.fib and CAD s/p CABG and maze surgery in 2013. Also hx of NICM with recovered EF. Underwent stress testing in 03/2023 (ordered by PCP) which was normal. Last seen in clinic on 09/02/23. He was stable from cardiac perspective. Exercising at the [facility]. On GDMT. Cleared for surgery:  Preoperative risk evaluation prior to orthopedic surgery According to the RCRI, he has a 6.6% risk of Mace. He remains active and can complete greater than 4.0 METS without angina. He has no unstable cardiac disease. No further cardiac testing needed. He may proceed with surgery.  Per office protocol, patient can hold Eliquis  for 2 days prior to procedure.  Patient seen by PCP on 04/08/23. Stress test was ordered for chest pain which was felt to be atypical (came back normal). Pt reported memory difficulties and was referred to Neurology. He's taking Aricept . Medical clearance signed (scanned in media on 07/16/23 that patient is low risk).  LD Eliquis : 8/2 @ 2300  VS: BP 131/78   Pulse 90   Temp 36.5 C (Oral)   Resp 16   Ht 5' 6 (1.676 m)   Wt 100.7 kg   SpO2 99%   BMI 35.83 kg/m   PROVIDERS: Norleen Lynwood ORN, MD   LABS: Labs reviewed: Acceptable for surgery. (all labs ordered are listed, but only abnormal results are displayed)  Labs Reviewed  BASIC METABOLIC PANEL WITH GFR - Abnormal; Notable for the following components:      Result Value   Glucose, Bld  115 (*)    BUN 33 (*)    Creatinine, Ser 1.69 (*)    GFR, Estimated 44 (*)    All other components within normal limits  SURGICAL PCR SCREEN  CBC  TYPE AND SCREEN     IMAGES:  CXR 09/21/23 (no report, awaiting radiologist interpretation):  Personal read: No acute cardiopulmonary findings   EKG: Sinus rhythm with 1st degree A-V block Rightward axis  CV:  Stress test 04/22/23:    Findings are consistent with no ischemia. The study is low risk.   No ST deviation was noted.   LV perfusion is abnormal. Defect 1: There is a small defect with mild reduction in uptake present in the apical apex location(s) that is fixed. There is normal wall motion in the defect area. Consistent with artifact.   Left ventricular function is normal. Nuclear stress EF: 53%. The left ventricular ejection fraction is mildly decreased (45-54%). End diastolic cavity size is mildly enlarged. End systolic cavity size is normal.   Prior study available for comparison from 03/06/2017. There are changes compared to prior study. The left ventricular ejection fraction has increased. LVEF 47%, no ischemia, low risk   Small size, mild intensity fixed apical perfusion defect, likely thinning artifact. No reversible ischemia. LVEF 53% with normal wall motion. This is a low risk study. Compared to a prior study in 2019, the LVEF is higher.  Echo 09/13/2018:  IMPRESSIONS    1. The left ventricle has mildly reduced systolic function, with an ejection fraction of 45-50%. The cavity size was normal. There is mildly increased left ventricular wall thickness. Diastolic function not assessed - images not acquired.. There is abnormal septal motion consistent with post-operative status.  2. The right ventricle has moderately reduced systolic function. The cavity was normal. There is no increase in right ventricular wall thickness. Right ventricular systolic pressure could not be assessed.  3. The aortic valve is tricuspid.  Aortic valve regurgitation is trivial by color flow Doppler. No stenosis of the aortic valve.  4. Left atrial size was mildly dilated.  5. When compared to the prior study: 09/2011 - images not available for side by side comparison.  Past Medical History:  Diagnosis Date   ALLERGIC RHINITIS 01/14/2007   Allergy    ANXIETY 09/28/2006   Asbestos exposure    Atrial fibrillation (HCC) 09/28/2006   s/p afib ablation x 2   CHF (congestive heart failure) (HCC)    CKD (chronic kidney disease), stage III (HCC)    patient denies   COPD (chronic obstructive pulmonary disease) (HCC)    Coronary artery disease 11/24/2011   Diastolic dysfunction 09/28/2006   Dyspnea    W/ EXERTION    Dysrhythmia    AFIB      First degree AV block 10/12/2018   Noted on EKG   Gallstones    Hyperglycemia    HYPERLIPIDEMIA 09/28/2006   HYPERTENSION 09/28/2006   HYPOTHYROIDISM, ACQUIRED NEC 09/28/2006   pt denies and doesn't know   Long term current use of anticoagulant 04/08/2010   Obese    OBSTRUCTIVE SLEEP APNEA 01/06/2008   NOT USING     Right knee DJD 07/09/2010   S/P CABG x 1 11/26/2011   Right internal mammary artery to right coronary artery   S/P Maze operation for atrial fibrillation 11/26/2011   complete biatrial lesion set with clipping of LA appendage   Sleep apnea     Past Surgical History:  Procedure Laterality Date   ANTERIOR CERVICAL DECOMP/DISCECTOMY FUSION N/A 06/20/2016   Procedure: Anterior Cervical Discectomy Fusion - Cervical five-Cervical six - Cervical six-Cervical seven - Cervicla seven- Thoracic one;  Surgeon: Victory Gunnels, MD;  Location: [facility] OR;  Service: Neurosurgery;  Laterality: N/A;   CARDIOVERSION     X4    CORONARY ARTERY BYPASS GRAFT  11/26/2011   Procedure: CORONARY ARTERY BYPASS GRAFTING (CABG);  Surgeon: Sudie VEAR Laine, MD;  Location: [facility] OR;  Service: Open Heart Surgery;  Laterality: N/A;  coronary artery bypass on pump times one utilizing the right internal mammary artery,  transesophageal echocardiogram    HAND SURGERY Left    KNEE ARTHROSCOPY Right 12/10/2021   Procedure: RIGH TKNEE ARTHROSCOPY WITH SCAR BAND EXCISION;  Surgeon: Sheril Coy, MD;  [facility];  Service: Orthopedics;  Laterality: Right;   LEFT HEART CATHETERIZATION WITH CORONARY ANGIOGRAM N/A 11/24/2011   Procedure: LEFT HEART CATHETERIZATION WITH CORONARY ANGIOGRAM;  Surgeon: Peter M Swaziland, MD;  Location: [facility] CATH LAB;  Service: Cardiovascular;  Laterality: N/A;   LOOP RECORDER IMPLANT N/A 07/06/2012   Procedure: LOOP RECORDER IMPLANT;  Surgeon: Lynwood Rakers, MD;  Location: [facility] CATH LAB;  Service: Cardiovascular;  Laterality: N/A;   MAZE  11/26/2011   Procedure: MAZE;  Surgeon: Sudie VEAR Laine, MD;  Location: [facility] OR;  Service: Open Heart Surgery;  Laterality: N/A;  Cryomaze    Radiofrequency Ablation for atrial fibrillation  12/30/07 and 06/05/09  afib ablation x 2 by [facility]   Radiofrequency ablation for atrial flutter  2005   CTI   TOTAL KNEE ARTHROPLASTY Right 04/05/2019   Procedure: RIGHT TOTAL KNEE ARTHROPLASTY;  Surgeon: Sheril Coy, MD;  [facility];  Service: Orthopedics;  Laterality: Right;    MEDICATIONS:  acetaminophen  (TYLENOL ) 500 MG tablet   albuterol  (VENTOLIN  HFA) 108 (90 Base) MCG/ACT inhaler   allopurinol  (ZYLOPRIM ) 100 MG tablet   ALPRAZolam  (XANAX ) 0.5 MG tablet   amLODipine  (NORVASC ) 5 MG tablet   cholecalciferol  (VITAMIN D ) 1000 units tablet   clotrimazole -betamethasone  (LOTRISONE ) cream   co-enzyme Q-10 30 MG capsule   colchicine  0.6 MG tablet   cyclobenzaprine  (FLEXERIL ) 5 MG tablet   docusate sodium  (COLACE) 100 MG capsule   donepezil  (ARICEPT ) 5 MG tablet   ELIQUIS  5 MG TABS tablet   Fluticasone -Umeclidin-Vilant (TRELEGY ELLIPTA ) 100-62.5-25 MCG/ACT AEPB   furosemide  (LASIX ) 20 MG tablet   gabapentin  (NEURONTIN ) 100 MG capsule   hydrochlorothiazide  (MICROZIDE ) 12.5 MG capsule   hydrocortisone  (ANUSOL -HC) 2.5 % rectal cream   lovastatin  (MEVACOR ) 40 MG  tablet   metoprolol  succinate (TOPROL -XL) 50 MG 24 hr tablet   Multiple Vitamin (MULTIVITAMIN WITH MINERALS) TABS tablet   NON FORMULARY   olmesartan  (BENICAR ) 40 MG tablet   spironolactone  (ALDACTONE ) 25 MG tablet   temazepam  (RESTORIL ) 30 MG capsule   traMADol  (ULTRAM ) 50 MG tablet   No current facility-administered medications for this encounter.    Burnard CHRISTELLA Odis DEVONNA [facility] Surgical Short Stay/Anesthesiology [facility] Phone [phone] 09/23/2023 11:01 AM

## 2023-09-23 NOTE — Anesthesia Preprocedure Evaluation (Addendum)
 Anesthesia Evaluation  Patient identified by MRN, date of birth, ID band Patient awake    Reviewed: Allergy & Precautions, NPO status , Patient's Chart, lab work & pertinent test results  Airway Mallampati: III  TM Distance: >3 FB Neck ROM: Limited    Dental  (+) Dental Advisory Given, Teeth Intact   Pulmonary shortness of breath, sleep apnea , COPD, former smoker   Pulmonary exam normal breath sounds clear to auscultation       Cardiovascular hypertension, Pt. on medications and Pt. on home beta blockers + CAD, + CABG and +CHF  + dysrhythmias Atrial Fibrillation  Rhythm:Regular Rate:Normal     Neuro/Psych   Anxiety     negative neurological ROS  negative psych ROS   GI/Hepatic negative GI ROS, Neg liver ROS,,,  Endo/Other  Hypothyroidism    Renal/GU Renal InsufficiencyRenal disease     Musculoskeletal  (+) Arthritis , Osteoarthritis,    Abdominal  (+) + obese  Peds  Hematology negative hematology ROS (+)   Anesthesia Other Findings   Reproductive/Obstetrics                              Anesthesia Physical Anesthesia Plan  ASA: 3  Anesthesia Plan: General   Post-op Pain Management: Tylenol  PO (pre-op)* and Gabapentin  PO (pre-op)*   Induction: Intravenous  PONV Risk Score and Plan: 3 and Ondansetron , Midazolam , Treatment may vary due to age or medical condition and Dexamethasone   Airway Management Planned: Video Laryngoscope Planned and Oral ETT  Additional Equipment:   Intra-op Plan:   Post-operative Plan: Extubation in OR  Informed Consent: I have reviewed the patients History and Physical, chart, labs and discussed the procedure including the risks, benefits and alternatives for the proposed anesthesia with the patient or authorized representative who has indicated his/her understanding and acceptance.     Dental advisory given  Plan Discussed with: CRNA  Anesthesia  Plan Comments: (See PAT note from 7/28  Pt gave multiple answers to last dose of eliquis . It took several minutes and the story changed multiple times. Times ranged from 8 days to ~48 hrs.  I explained that too long off eliquis  increases risk of stroke, and to short places greater risk of bleeding with spinal anesthesia. Given this, I feel it is safest to proceed with GETA. I discussed this with Dr. Dalldorf prior to induction.   Risks of anesthesia explained at length. This includes, but is not limited to, sore throat, damage to teeth, lips gums, tongue and vocal cords, nausea and vomiting, reactions to medications, stroke, heart attack, and death. All patient questions were answered and the patient wishes to proceed. )         Anesthesia Quick Evaluation

## 2023-09-24 NOTE — H&P (Signed)
 TOTAL HIP ADMISSION H&P  Patient is admitted for right total hip arthroplasty.  Subjective:  Chief Complaint: right hip pain  HPI: Scott [name], 68 y.o. male, has a history of pain and functional disability in the right hip(s) due to arthritis and patient has failed non-surgical conservative treatments for greater than 12 weeks to include NSAID's and/or analgesics, corticosteriod injections, flexibility and strengthening excercises, use of assistive devices, weight reduction as appropriate, and activity modification.  Onset of symptoms was gradual starting 5 years ago with gradually worsening course since that time.The patient noted no past surgery on the right hip(s).  Patient currently rates pain in the right hip at 10 out of 10 with activity. Patient has night pain, worsening of pain with activity and weight bearing, trendelenberg gait, pain that interfers with activities of daily living, and crepitus. Patient has evidence of subchondral cysts, subchondral sclerosis, periarticular osteophytes, and joint space narrowing by imaging studies. This condition presents safety issues increasing the risk of falls. There is no current active infection.  Patient Active Problem List   Diagnosis Date Noted   Memory changes 04/08/2023   Bradycardia 10/06/2022   Pain and swelling of right wrist 10/06/2022   Left ankle pain 04/07/2022   Left cataract 12/30/2021   Rash 11/14/2021   Axillary lymphadenopathy 11/14/2021   Low back pain 01/01/2021   Lower GI bleeding 01/01/2021   Pain due to total right knee replacement (HCC) 01/09/2020   Primary osteoarthritis of right knee 04/05/2019   CKD (chronic kidney disease) stage 3, GFR 30-59 ml/min (HCC) 03/14/2019   Degenerative joint disease of knee, right 09/27/2018   Dyspnea 09/06/2018   Right knee pain 09/06/2018   Radiculitis of left cervical region 03/05/2018   Plantar fasciitis, left 08/19/2017   Chest pain 02/27/2017   Right ear pain 02/27/2017    Left groin pain 09/13/2016   Rib pain on left side 09/13/2016   Left leg pain 09/13/2016   Stenosis of cervical spine with myelopathy (HCC) 06/20/2016   Special screening for malignant neoplasms, colon 04/04/2016   Cough 01/11/2016   COPD exacerbation (HCC) 12/22/2015   Rectal bleeding 08/14/2014   Intercostal muscle strain 08/07/2014   Chronic meniscal tear of knee 04/06/2014   Heel bone fracture 04/06/2014   Hyperglycemia 04/04/2014   Recurrent falls 04/04/2014   Bilateral knee pain 04/04/2014   Intractable left heel pain 04/04/2014   Insomnia 04/04/2014   Hematochezia 04/04/2014   Bladder neck obstruction 12/19/2013   Encounter for therapeutic drug monitoring 03/30/2013   Palpitations 07/06/2012   Shortness of breath 01/19/2012   Sick sinus syndrome (HCC) 12/01/2011   S/P CABG x 1 11/26/2011   S/P Maze operation for atrial fibrillation 11/26/2011   CAD (coronary artery disease) 11/25/2011   Paroxysmal atrial fibrillation (HCC) 11/24/2011   Chronic combined systolic (congestive) and diastolic (congestive) heart failure (HCC) 11/24/2011   Gross hematuria 12/12/2010   Left knee pain 12/12/2010   Left knee DJD 07/09/2010   Encounter for well adult exam with abnormal findings 07/07/2010   Chronic anticoagulation 04/08/2010   COPD (chronic obstructive pulmonary disease) (HCC) 10/04/2008   OBSTRUCTIVE SLEEP APNEA 01/06/2008   Allergic rhinitis 01/14/2007   Hypothyroidism 09/28/2006   Hyperlipidemia 09/28/2006   Anxiety state 09/28/2006   Essential hypertension 09/28/2006   Past Medical History:  Diagnosis Date   ALLERGIC RHINITIS 01/14/2007   Allergy    ANXIETY 09/28/2006   Asbestos exposure    Atrial fibrillation (HCC) 09/28/2006   s/p afib  ablation x 2   CHF (congestive heart failure) (HCC)    CKD (chronic kidney disease), stage III (HCC)    patient denies   COPD (chronic obstructive pulmonary disease) (HCC)    Coronary artery disease 11/24/2011   Diastolic  dysfunction 09/28/2006   Dyspnea    W/ EXERTION    Dysrhythmia    AFIB      First degree AV block 10/12/2018   Noted on EKG   Gallstones    Hyperglycemia    HYPERLIPIDEMIA 09/28/2006   HYPERTENSION 09/28/2006   HYPOTHYROIDISM, ACQUIRED NEC 09/28/2006   pt denies and doesn't know   Long term current use of anticoagulant 04/08/2010   Obese    OBSTRUCTIVE SLEEP APNEA 01/06/2008   NOT USING     Right knee DJD 07/09/2010   S/P CABG x 1 11/26/2011   Right internal mammary artery to right coronary artery   S/P Maze operation for atrial fibrillation 11/26/2011   complete biatrial lesion set with clipping of LA appendage    Past Surgical History:  Procedure Laterality Date   ANTERIOR CERVICAL DECOMP/DISCECTOMY FUSION N/A 06/20/2016   Procedure: Anterior Cervical Discectomy Fusion - Cervical five-Cervical six - Cervical six-Cervical seven - Cervicla seven- Thoracic one;  Surgeon: Victory Gunnels, MD;  Location: [facility] OR;  Service: Neurosurgery;  Laterality: N/A;   CARDIOVERSION     X4    CORONARY ARTERY BYPASS GRAFT  11/26/2011   Procedure: CORONARY ARTERY BYPASS GRAFTING (CABG);  Surgeon: Sudie VEAR Laine, MD;  Location: [facility] OR;  Service: Open Heart Surgery;  Laterality: N/A;  coronary artery bypass on pump times one utilizing the right internal mammary artery, transesophageal echocardiogram    HAND SURGERY Left    KNEE ARTHROSCOPY Right 12/10/2021   Procedure: RIGH TKNEE ARTHROSCOPY WITH SCAR BAND EXCISION;  Surgeon: Sheril Coy, MD;  Location: [facility] ORS;  Service: Orthopedics;  Laterality: Right;   LEFT HEART CATHETERIZATION WITH CORONARY ANGIOGRAM N/A 11/24/2011   Procedure: LEFT HEART CATHETERIZATION WITH CORONARY ANGIOGRAM;  Surgeon: Peter M Swaziland, MD;  Location: [facility] CATH LAB;  Service: Cardiovascular;  Laterality: N/A;   LOOP RECORDER IMPLANT N/A 07/06/2012   Procedure: LOOP RECORDER IMPLANT;  Surgeon: Lynwood Rakers, MD;  Location: [facility] CATH LAB;  Service: Cardiovascular;  Laterality: N/A;   MAZE   11/26/2011   Procedure: MAZE;  Surgeon: Sudie VEAR Laine, MD;  Location: [facility] OR;  Service: Open Heart Surgery;  Laterality: N/A;  Cryomaze    Radiofrequency Ablation for atrial fibrillation  12/30/07 and 06/05/09   afib ablation x 2 by JA   Radiofrequency ablation for atrial flutter  2005   CTI   TOTAL KNEE ARTHROPLASTY Right 04/05/2019   Procedure: RIGHT TOTAL KNEE ARTHROPLASTY;  Surgeon: Sheril Coy, MD;  Location: [facility] ORS;  Service: Orthopedics;  Laterality: Right;    No current facility-administered medications for this encounter.   Current Outpatient Medications  Medication Sig Dispense Refill Last Dose/Taking   acetaminophen  (TYLENOL ) 500 MG tablet Take 1,000 mg by mouth every 6 (six) hours as needed for mild pain.    Taking As Needed   albuterol  (VENTOLIN  HFA) 108 (90 Base) MCG/ACT inhaler TAKE 2 PUFFS BY MOUTH EVERY 6 HOURS AS NEEDED FOR WHEEZE OR SHORTNESS OF BREATH 6.7 each 5 Taking   allopurinol  (ZYLOPRIM ) 100 MG tablet Take 1 tablet (100 mg total) by mouth daily. To lower uric acid and prevent gout 90 tablet 3 Taking   ALPRAZolam  (XANAX ) 0.5 MG tablet TAKE 1 TABLET BY MOUTH TWICE A  DAY AS NEEDED FOR ANXIETY 60 tablet 2 Taking   amLODipine  (NORVASC ) 5 MG tablet Take 1 tablet (5 mg total) by mouth daily. 90 tablet 3 Taking   cholecalciferol  (VITAMIN D ) 1000 units tablet Take 1,000 Units by mouth daily.   Taking   co-enzyme Q-10 30 MG capsule Take 30 mg by mouth daily.   Taking   colchicine  0.6 MG tablet Take 1 tablet (0.6 mg total) by mouth daily as needed (gout pain). 90 tablet 3 Taking As Needed   docusate sodium  (COLACE) 100 MG capsule Take 200 mg by mouth daily as needed for mild constipation.    Taking As Needed   donepezil  (ARICEPT ) 5 MG tablet Take 1 tablet (5 mg total) by mouth at bedtime. 90 tablet 3 Taking   ELIQUIS  5 MG TABS tablet TAKE 1 TABLET BY MOUTH TWICE A DAY 180 tablet 3 Taking   Fluticasone -Umeclidin-Vilant (TRELEGY ELLIPTA ) 100-62.5-25 MCG/ACT AEPB Take 1 inhalation  once daily 180 each 3 Taking   furosemide  (LASIX ) 20 MG tablet TAKE 1 TABLET BY MOUTH EVERY DAY AS NEEDED 15 tablet 0 Taking   gabapentin  (NEURONTIN ) 100 MG capsule TAKE 1 CAPSULE BY MOUTH THREE TIMES A DAY 270 capsule 3 Taking   hydrochlorothiazide  (MICROZIDE ) 12.5 MG capsule Take 1 capsule (12.5 mg total) by mouth daily. 90 capsule 3 Taking   lovastatin  (MEVACOR ) 40 MG tablet Take 1 tablet (40 mg total) by mouth daily. 180 tablet 3 Taking   metoprolol  succinate (TOPROL -XL) 50 MG 24 hr tablet TAKE 1 TABLETS once DAILY BY MOUTH 90 tablet 3 Taking   Multiple Vitamin (MULTIVITAMIN WITH MINERALS) TABS tablet Take 1 tablet by mouth daily.    Taking   NON FORMULARY Pt uses cpap nightly   Taking   olmesartan  (BENICAR ) 40 MG tablet Take 1 tablet (40 mg total) by mouth daily. 90 tablet 3 Taking   spironolactone  (ALDACTONE ) 25 MG tablet TAKE 0.5 TABLETS BY MOUTH AT BEDTIME. 45 tablet 2 Taking   temazepam  (RESTORIL ) 30 MG capsule TAKE 1 CAPSULE (30 MG TOTAL) BY MOUTH AT BEDTIME AS NEEDED. FOR SLEEP (Patient taking differently: Take 30 mg by mouth at bedtime.) 30 capsule 5 Taking Differently   traMADol  (ULTRAM ) 50 MG tablet TAKE 1 TABLET BY MOUTH EVERY 6 HOURS AS NEEDED (Patient taking differently: Take 100 mg by mouth in the morning.) 120 tablet 5 Taking Differently   clotrimazole -betamethasone  (LOTRISONE ) cream Apply 1 Application topically daily. (Patient not taking: Reported on 09/16/2023) 30 g 1 Not Taking   cyclobenzaprine  (FLEXERIL ) 5 MG tablet Take 1 tablet (5 mg total) by mouth 3 (three) times daily as needed for muscle spasms. (Patient not taking: Reported on 09/16/2023) 40 tablet 1 Not Taking   hydrocortisone  (ANUSOL -HC) 2.5 % rectal cream Place 1 Application rectally at bedtime. (Patient not taking: Reported on 09/16/2023) 30 g 2 Not Taking   Allergies  Allergen Reactions   Zolpidem Tartrate Other (See Comments)    Extremely drowsy the next day    Social History   Tobacco Use   Smoking status:  Former    Current packs/day: 0.00    Average packs/day: 1.5 packs/day for 26.0 years (39.0 ttl pk-yrs)    Types: Cigarettes    Start date: 04/05/1969    Quit date: 04/06/1995    Years since quitting: 28.4   Smokeless tobacco: Never  Substance Use Topics   Alcohol use: No    Alcohol/week: 0.0 standard drinks of alcohol    Family History  Problem Relation Age  of Onset   Dementia Father    Hypertension Father    Atrial fibrillation Mother    Stroke Mother    Dementia Paternal Grandfather    Cancer Paternal Grandmother        type unknown   Colon cancer Neg Hx    Esophageal cancer Neg Hx    Prostate cancer Neg Hx    Rectal cancer Neg Hx    Stomach cancer Neg Hx      Review of Systems  Musculoskeletal:  Positive for arthralgias.       Right hip  All other systems reviewed and are negative.   Objective:  Physical Exam Constitutional:      Appearance: Normal appearance.  HENT:     Head: Normocephalic and atraumatic.     Mouth/Throat:     Pharynx: Oropharynx is clear.  Eyes:     Extraocular Movements: Extraocular movements intact.  Pulmonary:     Effort: Pulmonary effort is normal.  Abdominal:     Palpations: Abdomen is soft.  Musculoskeletal:     Cervical back: Normal range of motion.     Comments: Right hip motion is extremely painful with internal rotation.  He walks with an altered gait.  Leg lengths seem equal.  His knee ranges 0-120 with good stability and no effusion.  He has normal unlabored respirations.  Sensation and motor function are intact in both feet with palpable pulses on both sides.    Skin:    General: Skin is warm and dry.  Neurological:     General: No focal deficit present.     Mental Status: He is alert and oriented to person, place, and time.  Psychiatric:        Mood and Affect: Mood normal.        Behavior: Behavior normal.        Thought Content: Thought content normal.        Judgment: Judgment normal.     Vital signs in last 24  hours:    Labs:   Estimated body mass index is 35.83 kg/m as calculated from the following:   Height as of 09/21/23: 5' 6 (1.676 m).   Weight as of 09/21/23: 100.7 kg.   Imaging Review Plain radiographs demonstrate severe degenerative joint disease of the right hip(s). The bone quality appears to be good for age and reported activity level.      Assessment/Plan:  End stage primary arthritis, right hip(s)  The patient history, physical examination, clinical judgement of the provider and imaging studies are consistent with end stage degenerative joint disease of the right hip(s) and total hip arthroplasty is deemed medically necessary. The treatment options including medical management, injection therapy, arthroscopy and arthroplasty were discussed at length. The risks and benefits of total hip arthroplasty were presented and reviewed. The risks due to aseptic loosening, infection, stiffness, dislocation/subluxation,  thromboembolic complications and other imponderables were discussed.  The patient acknowledged the explanation, agreed to proceed with the plan and consent was signed. Patient is being admitted for inpatient treatment for surgery, pain control, PT, OT, prophylactic antibiotics, VTE prophylaxis, progressive ambulation and ADL's and discharge planning.The patient is planning to be discharged home with home health services

## 2023-09-28 MED ORDER — TRANEXAMIC ACID 1000 MG/10ML IV SOLN
2000.0000 mg | INTRAVENOUS | Status: DC
  Filled 2023-09-28: qty 20

## 2023-09-29 ENCOUNTER — Ambulatory Visit (HOSPITAL_BASED_OUTPATIENT_CLINIC_OR_DEPARTMENT_OTHER): Payer: Self-pay | Admitting: Anesthesiology

## 2023-09-29 ENCOUNTER — Other Ambulatory Visit: Payer: Self-pay

## 2023-09-29 ENCOUNTER — Observation Stay (HOSPITAL_COMMUNITY)
Admission: RE | Admit: 2023-09-29 | Discharge: 2023-09-30 | Disposition: A | Discharge status: Home / Self Care | Attending: Orthopaedic Surgery | Admitting: Orthopaedic Surgery

## 2023-09-29 ENCOUNTER — Ambulatory Visit (HOSPITAL_COMMUNITY)

## 2023-09-29 ENCOUNTER — Encounter (HOSPITAL_COMMUNITY): Payer: Self-pay | Admitting: Orthopaedic Surgery

## 2023-09-29 ENCOUNTER — Encounter (HOSPITAL_COMMUNITY)
Admission: RE | Disposition: A | Discharge status: Home / Self Care | Payer: Self-pay | Source: Home / Self Care | Attending: Orthopaedic Surgery

## 2023-09-29 ENCOUNTER — Ambulatory Visit (HOSPITAL_COMMUNITY): Payer: Self-pay | Admitting: Medical

## 2023-09-29 DIAGNOSIS — Z96641 Presence of right artificial hip joint: Secondary | ICD-10-CM | POA: Diagnosis not present

## 2023-09-29 DIAGNOSIS — M1611 Unilateral primary osteoarthritis, right hip: Principal | ICD-10-CM | POA: Insufficient documentation

## 2023-09-29 DIAGNOSIS — E039 Hypothyroidism, unspecified: Secondary | ICD-10-CM | POA: Insufficient documentation

## 2023-09-29 DIAGNOSIS — M25551 Pain in right hip: Secondary | ICD-10-CM | POA: Diagnosis not present

## 2023-09-29 DIAGNOSIS — I509 Heart failure, unspecified: Secondary | ICD-10-CM | POA: Insufficient documentation

## 2023-09-29 DIAGNOSIS — I2510 Atherosclerotic heart disease of native coronary artery without angina pectoris: Secondary | ICD-10-CM | POA: Insufficient documentation

## 2023-09-29 DIAGNOSIS — I130 Hypertensive heart and chronic kidney disease with heart failure and stage 1 through stage 4 chronic kidney disease, or unspecified chronic kidney disease: Secondary | ICD-10-CM

## 2023-09-29 DIAGNOSIS — J449 Chronic obstructive pulmonary disease, unspecified: Secondary | ICD-10-CM | POA: Diagnosis not present

## 2023-09-29 DIAGNOSIS — Z87891 Personal history of nicotine dependence: Secondary | ICD-10-CM | POA: Diagnosis not present

## 2023-09-29 DIAGNOSIS — N183 Chronic kidney disease, stage 3 unspecified: Secondary | ICD-10-CM | POA: Insufficient documentation

## 2023-09-29 DIAGNOSIS — Z471 Aftercare following joint replacement surgery: Secondary | ICD-10-CM | POA: Diagnosis not present

## 2023-09-29 LAB — TYPE AND SCREEN
ABO/RH(D): B NEG
Antibody Screen: NEGATIVE

## 2023-09-29 LAB — GLUCOSE, CAPILLARY: Glucose-Capillary: 158 mg/dL — ABNORMAL HIGH (ref 70–99)

## 2023-09-29 SURGERY — ARTHROPLASTY, HIP, TOTAL, ANTERIOR APPROACH
Anesthesia: General | Site: Hip | Laterality: Right

## 2023-09-29 MED ORDER — 0.9 % SODIUM CHLORIDE (POUR BTL) OPTIME
TOPICAL | Status: DC | PRN
  Administered 2023-09-29: 1000 mL

## 2023-09-29 MED ORDER — IRBESARTAN 300 MG PO TABS
300.0000 mg | ORAL_TABLET | Freq: Every day | ORAL | Status: DC
  Administered 2023-09-30: 300 mg via ORAL
  Filled 2023-09-29: qty 1
  Filled 2023-09-29: qty 2

## 2023-09-29 MED ORDER — LIDOCAINE HCL (CARDIAC) PF 100 MG/5ML IV SOSY
PREFILLED_SYRINGE | INTRAVENOUS | Status: DC | PRN
  Administered 2023-09-29: 100 mg via INTRAVENOUS

## 2023-09-29 MED ORDER — MENTHOL 3 MG MT LOZG: 1.0000 | LOZENGE | OROMUCOSAL | Status: DC | PRN

## 2023-09-29 MED ORDER — KETOROLAC TROMETHAMINE 15 MG/ML IJ SOLN
7.5000 mg | Freq: Four times a day (QID) | INTRAMUSCULAR | Status: AC
  Administered 2023-09-29 – 2023-09-30 (×3): 7.5 mg via INTRAVENOUS
  Filled 2023-09-29 (×3): qty 1

## 2023-09-29 MED ORDER — LACTATED RINGERS IV SOLN: INTRAVENOUS | Status: DC

## 2023-09-29 MED ORDER — FENTANYL CITRATE (PF) 100 MCG/2ML IJ SOLN
INTRAMUSCULAR | Status: AC
Start: 2023-09-29 — End: 2023-09-29
  Filled 2023-09-29: qty 2

## 2023-09-29 MED ORDER — PHENYLEPHRINE HCL-NACL 20-0.9 MG/250ML-% IV SOLN
INTRAVENOUS | Status: AC
  Filled 2023-09-29: qty 250

## 2023-09-29 MED ORDER — DOCUSATE SODIUM 100 MG PO CAPS
100.0000 mg | ORAL_CAPSULE | Freq: Two times a day (BID) | ORAL | Status: DC
  Administered 2023-09-29 – 2023-09-30 (×2): 100 mg via ORAL
  Filled 2023-09-29 (×2): qty 1

## 2023-09-29 MED ORDER — TEMAZEPAM 15 MG PO CAPS
30.0000 mg | ORAL_CAPSULE | Freq: Every day | ORAL | Status: DC
  Administered 2023-09-29: 30 mg via ORAL
  Filled 2023-09-29: qty 2

## 2023-09-29 MED ORDER — APIXABAN 5 MG PO TABS
5.0000 mg | ORAL_TABLET | Freq: Two times a day (BID) | ORAL | Status: DC
  Administered 2023-09-30: 5 mg via ORAL
  Filled 2023-09-29: qty 1

## 2023-09-29 MED ORDER — PHENYLEPHRINE HCL (PRESSORS) 10 MG/ML IV SOLN
INTRAVENOUS | Status: AC
  Filled 2023-09-29: qty 1

## 2023-09-29 MED ORDER — SPIRONOLACTONE 12.5 MG HALF TABLET
12.5000 mg | ORAL_TABLET | Freq: Every day | ORAL | Status: DC
  Administered 2023-09-29: 12.5 mg via ORAL
  Filled 2023-09-29: qty 1

## 2023-09-29 MED ORDER — SUCCINYLCHOLINE CHLORIDE 200 MG/10ML IV SOSY
PREFILLED_SYRINGE | INTRAVENOUS | Status: AC
  Filled 2023-09-29: qty 10

## 2023-09-29 MED ORDER — HYDROMORPHONE HCL 1 MG/ML IJ SOLN
INTRAMUSCULAR | Status: AC
  Filled 2023-09-29: qty 1

## 2023-09-29 MED ORDER — BUPIVACAINE-EPINEPHRINE (PF) 0.25% -1:200000 IJ SOLN
INTRAMUSCULAR | Status: DC | PRN
  Administered 2023-09-29: 40 mL

## 2023-09-29 MED ORDER — MORPHINE SULFATE (PF) 2 MG/ML IV SOLN: 0.5000 mg | INTRAVENOUS | Status: DC | PRN

## 2023-09-29 MED ORDER — ONDANSETRON HCL 4 MG/2ML IJ SOLN
INTRAMUSCULAR | Status: DC | PRN
  Administered 2023-09-29: 4 mg via INTRAVENOUS

## 2023-09-29 MED ORDER — BUPIVACAINE LIPOSOME 1.3 % IJ SUSP
INTRAMUSCULAR | Status: AC
  Filled 2023-09-29: qty 10

## 2023-09-29 MED ORDER — ACETAMINOPHEN 500 MG PO TABS
500.0000 mg | ORAL_TABLET | Freq: Four times a day (QID) | ORAL | Status: AC
  Administered 2023-09-29 – 2023-09-30 (×3): 500 mg via ORAL
  Filled 2023-09-29 (×3): qty 1

## 2023-09-29 MED ORDER — PHENYLEPHRINE HCL-NACL 20-0.9 MG/250ML-% IV SOLN
INTRAVENOUS | Status: DC | PRN
Start: 2023-09-29 — End: 2023-09-29
  Administered 2023-09-29: 40 ug/min via INTRAVENOUS

## 2023-09-29 MED ORDER — METHOCARBAMOL 1000 MG/10ML IJ SOLN
500.0000 mg | Freq: Four times a day (QID) | INTRAMUSCULAR | Status: DC | PRN
  Administered 2023-09-29: 500 mg via INTRAVENOUS

## 2023-09-29 MED ORDER — METHOCARBAMOL 1000 MG/10ML IJ SOLN
INTRAMUSCULAR | Status: AC
  Filled 2023-09-29: qty 10

## 2023-09-29 MED ORDER — GABAPENTIN 300 MG PO CAPS
300.0000 mg | ORAL_CAPSULE | Freq: Once | ORAL | Status: DC
  Filled 2023-09-29: qty 1

## 2023-09-29 MED ORDER — HYDROCODONE-ACETAMINOPHEN 7.5-325 MG PO TABS: 1.0000 | ORAL_TABLET | ORAL | Status: DC | PRN

## 2023-09-29 MED ORDER — METOCLOPRAMIDE HCL 5 MG/ML IJ SOLN
5.0000 mg | Freq: Three times a day (TID) | INTRAMUSCULAR | Status: DC | PRN
  Administered 2023-09-29: 10 mg via INTRAVENOUS
  Filled 2023-09-29: qty 2

## 2023-09-29 MED ORDER — ONDANSETRON HCL 4 MG PO TABS: 4.0000 mg | ORAL_TABLET | Freq: Four times a day (QID) | ORAL | Status: DC | PRN

## 2023-09-29 MED ORDER — OXYCODONE HCL 5 MG/5ML PO SOLN: 5.0000 mg | Freq: Once | ORAL | Status: AC | PRN

## 2023-09-29 MED ORDER — ALBUMIN HUMAN 5 % IV SOLN: INTRAVENOUS | Status: DC | PRN

## 2023-09-29 MED ORDER — DONEPEZIL HCL 5 MG PO TABS
5.0000 mg | ORAL_TABLET | Freq: Every day | ORAL | Status: DC
  Administered 2023-09-29: 5 mg via ORAL
  Filled 2023-09-29: qty 1

## 2023-09-29 MED ORDER — HYDROMORPHONE HCL 1 MG/ML IJ SOLN
INTRAMUSCULAR | Status: AC
  Filled 2023-09-29: qty 2

## 2023-09-29 MED ORDER — PHENOL 1.4 % MT LIQD: 1.0000 | OROMUCOSAL | Status: DC | PRN

## 2023-09-29 MED ORDER — CHLORHEXIDINE GLUCONATE 0.12 % MT SOLN
15.0000 mL | Freq: Once | OROMUCOSAL | Status: AC
  Administered 2023-09-29: 15 mL via OROMUCOSAL

## 2023-09-29 MED ORDER — ALPRAZOLAM 0.5 MG PO TABS: 0.5000 mg | ORAL_TABLET | Freq: Two times a day (BID) | ORAL | Status: DC | PRN

## 2023-09-29 MED ORDER — LIDOCAINE HCL (PF) 2 % IJ SOLN
INTRAMUSCULAR | Status: AC
  Filled 2023-09-29: qty 5

## 2023-09-29 MED ORDER — ALBUTEROL SULFATE HFA 108 (90 BASE) MCG/ACT IN AERS
INHALATION_SPRAY | RESPIRATORY_TRACT | Status: DC | PRN
  Administered 2023-09-29 (×2): 2 via RESPIRATORY_TRACT

## 2023-09-29 MED ORDER — OXYCODONE HCL 5 MG PO TABS
10.0000 mg | ORAL_TABLET | Freq: Once | ORAL | Status: AC | PRN
  Administered 2023-09-29: 10 mg via ORAL

## 2023-09-29 MED ORDER — METOCLOPRAMIDE HCL 5 MG PO TABS: 5.0000 mg | ORAL_TABLET | Freq: Three times a day (TID) | ORAL | Status: DC | PRN

## 2023-09-29 MED ORDER — EPHEDRINE SULFATE-NACL 50-0.9 MG/10ML-% IV SOSY
PREFILLED_SYRINGE | INTRAVENOUS | Status: DC | PRN
  Administered 2023-09-29: 10 mg via INTRAVENOUS

## 2023-09-29 MED ORDER — HYDROMORPHONE HCL 2 MG/ML IJ SOLN
INTRAMUSCULAR | Status: AC
Start: 2023-09-29 — End: 2023-09-29
  Filled 2023-09-29: qty 1

## 2023-09-29 MED ORDER — DEXAMETHASONE SODIUM PHOSPHATE 10 MG/ML IJ SOLN
INTRAMUSCULAR | Status: AC
  Filled 2023-09-29: qty 1

## 2023-09-29 MED ORDER — CEFAZOLIN SODIUM-DEXTROSE 2-4 GM/100ML-% IV SOLN
2.0000 g | INTRAVENOUS | Status: AC
  Administered 2023-09-29: 2 g via INTRAVENOUS
  Filled 2023-09-29: qty 100

## 2023-09-29 MED ORDER — METHOCARBAMOL 500 MG PO TABS: 500.0000 mg | ORAL_TABLET | Freq: Four times a day (QID) | ORAL | Status: DC | PRN

## 2023-09-29 MED ORDER — ACETAMINOPHEN 325 MG PO TABS: 325.0000 mg | ORAL_TABLET | Freq: Four times a day (QID) | ORAL | Status: DC | PRN

## 2023-09-29 MED ORDER — PROPOFOL 1000 MG/100ML IV EMUL
INTRAVENOUS | Status: AC
  Filled 2023-09-29: qty 100

## 2023-09-29 MED ORDER — BUPIVACAINE-EPINEPHRINE (PF) 0.25% -1:200000 IJ SOLN
INTRAMUSCULAR | Status: AC
  Filled 2023-09-29: qty 30

## 2023-09-29 MED ORDER — ACETAMINOPHEN 500 MG PO TABS
1000.0000 mg | ORAL_TABLET | Freq: Once | ORAL | Status: AC
  Administered 2023-09-29: 1000 mg via ORAL
  Filled 2023-09-29: qty 2

## 2023-09-29 MED ORDER — TRANEXAMIC ACID-NACL 1000-0.7 MG/100ML-% IV SOLN
1000.0000 mg | Freq: Once | INTRAVENOUS | Status: AC
  Administered 2023-09-29: 1000 mg via INTRAVENOUS
  Filled 2023-09-29: qty 100

## 2023-09-29 MED ORDER — AMLODIPINE BESYLATE 5 MG PO TABS
5.0000 mg | ORAL_TABLET | Freq: Every day | ORAL | Status: DC
  Administered 2023-09-30: 5 mg via ORAL
  Filled 2023-09-29: qty 1

## 2023-09-29 MED ORDER — TRANEXAMIC ACID-NACL 1000-0.7 MG/100ML-% IV SOLN
1000.0000 mg | INTRAVENOUS | Status: AC
  Administered 2023-09-29: 1000 mg via INTRAVENOUS
  Filled 2023-09-29: qty 100

## 2023-09-29 MED ORDER — HYDROMORPHONE HCL 1 MG/ML IJ SOLN
INTRAMUSCULAR | Status: DC | PRN
  Administered 2023-09-29: 1 mg via INTRAVENOUS
  Administered 2023-09-29 (×2): .5 mg via INTRAVENOUS

## 2023-09-29 MED ORDER — SUCCINYLCHOLINE CHLORIDE 200 MG/10ML IV SOSY
PREFILLED_SYRINGE | INTRAVENOUS | Status: DC | PRN
  Administered 2023-09-29: 80 mg via INTRAVENOUS

## 2023-09-29 MED ORDER — BUPIVACAINE LIPOSOME 1.3 % IJ SUSP: 10.0000 mL | Freq: Once | INTRAMUSCULAR | Status: DC

## 2023-09-29 MED ORDER — POVIDONE-IODINE 10 % EX SWAB: 2.0000 | Freq: Once | CUTANEOUS | Status: DC

## 2023-09-29 MED ORDER — PRAVASTATIN SODIUM 40 MG PO TABS
40.0000 mg | ORAL_TABLET | Freq: Every day | ORAL | Status: DC
  Administered 2023-09-29: 40 mg via ORAL
  Filled 2023-09-29: qty 1
  Filled 2023-09-29: qty 2
  Filled 2023-09-29: qty 1

## 2023-09-29 MED ORDER — DEXAMETHASONE SODIUM PHOSPHATE 10 MG/ML IJ SOLN
INTRAMUSCULAR | Status: DC | PRN
  Administered 2023-09-29: 10 mg via INTRAVENOUS

## 2023-09-29 MED ORDER — ALLOPURINOL 100 MG PO TABS
100.0000 mg | ORAL_TABLET | Freq: Every day | ORAL | Status: DC
  Administered 2023-09-30: 100 mg via ORAL
  Filled 2023-09-29: qty 1

## 2023-09-29 MED ORDER — HYDROCHLOROTHIAZIDE 12.5 MG PO CAPS
12.5000 mg | ORAL_CAPSULE | Freq: Every day | ORAL | Status: DC
Start: 2023-09-30 — End: 2023-09-30
  Filled 2023-09-29: qty 1

## 2023-09-29 MED ORDER — OXYCODONE HCL 5 MG PO TABS
ORAL_TABLET | ORAL | Status: AC
  Filled 2023-09-29: qty 2

## 2023-09-29 MED ORDER — ALUM & MAG HYDROXIDE-SIMETH 200-200-20 MG/5ML PO SUSP: 30.0000 mL | ORAL | Status: DC | PRN

## 2023-09-29 MED ORDER — METOPROLOL SUCCINATE ER 50 MG PO TB24
50.0000 mg | ORAL_TABLET | Freq: Every day | ORAL | Status: DC
Start: 2023-09-30 — End: 2023-09-30
  Administered 2023-09-30: 50 mg via ORAL
  Filled 2023-09-29: qty 1

## 2023-09-29 MED ORDER — PROPOFOL 10 MG/ML IV BOLUS
INTRAVENOUS | Status: DC | PRN
  Administered 2023-09-29: 100 mg via INTRAVENOUS

## 2023-09-29 MED ORDER — MIDAZOLAM HCL 2 MG/2ML IJ SOLN
INTRAMUSCULAR | Status: DC | PRN
  Administered 2023-09-29: 2 mg via INTRAVENOUS

## 2023-09-29 MED ORDER — TRANEXAMIC ACID 1000 MG/10ML IV SOLN
INTRAVENOUS | Status: DC | PRN
  Administered 2023-09-29: 2000 mg via TOPICAL

## 2023-09-29 MED ORDER — ORAL CARE MOUTH RINSE: 15.0000 mL | Freq: Once | OROMUCOSAL | Status: AC

## 2023-09-29 MED ORDER — BUDESON-GLYCOPYRROL-FORMOTEROL 160-9-4.8 MCG/ACT IN AERO
2.0000 | INHALATION_SPRAY | Freq: Two times a day (BID) | RESPIRATORY_TRACT | Status: DC
  Administered 2023-09-29 – 2023-09-30 (×2): 2 via RESPIRATORY_TRACT
  Filled 2023-09-29: qty 5.9

## 2023-09-29 MED ORDER — FENTANYL CITRATE (PF) 100 MCG/2ML IJ SOLN
INTRAMUSCULAR | Status: DC | PRN
  Administered 2023-09-29: 100 ug via INTRAVENOUS

## 2023-09-29 MED ORDER — ALBUTEROL SULFATE (2.5 MG/3ML) 0.083% IN NEBU: 2.5000 mg | INHALATION_SOLUTION | Freq: Four times a day (QID) | RESPIRATORY_TRACT | Status: DC | PRN

## 2023-09-29 MED ORDER — CEFAZOLIN SODIUM-DEXTROSE 2-4 GM/100ML-% IV SOLN
2.0000 g | Freq: Four times a day (QID) | INTRAVENOUS | Status: AC
  Administered 2023-09-29 (×2): 2 g via INTRAVENOUS
  Filled 2023-09-29 (×2): qty 100

## 2023-09-29 MED ORDER — BISACODYL 5 MG PO TBEC: 5.0000 mg | DELAYED_RELEASE_TABLET | Freq: Every day | ORAL | Status: DC | PRN

## 2023-09-29 MED ORDER — ALBUTEROL SULFATE HFA 108 (90 BASE) MCG/ACT IN AERS
2.0000 | INHALATION_SPRAY | Freq: Four times a day (QID) | RESPIRATORY_TRACT | Status: DC | PRN
  Administered 2023-09-29: 2 via RESPIRATORY_TRACT

## 2023-09-29 MED ORDER — HYDROMORPHONE HCL 1 MG/ML IJ SOLN
0.2500 mg | INTRAMUSCULAR | Status: DC | PRN
  Administered 2023-09-29 (×4): 0.5 mg via INTRAVENOUS

## 2023-09-29 MED ORDER — MIDAZOLAM HCL 2 MG/2ML IJ SOLN
INTRAMUSCULAR | Status: AC
  Filled 2023-09-29: qty 2

## 2023-09-29 MED ORDER — GABAPENTIN 100 MG PO CAPS
100.0000 mg | ORAL_CAPSULE | Freq: Three times a day (TID) | ORAL | Status: DC
  Administered 2023-09-29 – 2023-09-30 (×3): 100 mg via ORAL
  Filled 2023-09-29 (×3): qty 1

## 2023-09-29 MED ORDER — ROCURONIUM BROMIDE 10 MG/ML (PF) SYRINGE
PREFILLED_SYRINGE | INTRAVENOUS | Status: AC
  Filled 2023-09-29: qty 10

## 2023-09-29 MED ORDER — ONDANSETRON HCL 4 MG/2ML IJ SOLN
4.0000 mg | Freq: Four times a day (QID) | INTRAMUSCULAR | Status: DC | PRN
  Administered 2023-09-29: 4 mg via INTRAVENOUS
  Filled 2023-09-29 (×2): qty 2

## 2023-09-29 MED ORDER — HYDROMORPHONE HCL 1 MG/ML IJ SOLN
0.2500 mg | INTRAMUSCULAR | Status: DC | PRN
  Administered 2023-09-29 (×2): 0.5 mg via INTRAVENOUS

## 2023-09-29 MED ORDER — DIPHENHYDRAMINE HCL 12.5 MG/5ML PO ELIX: 12.5000 mg | ORAL_SOLUTION | ORAL | Status: DC | PRN

## 2023-09-29 MED ORDER — HYDROCODONE-ACETAMINOPHEN 5-325 MG PO TABS
1.0000 | ORAL_TABLET | ORAL | Status: DC | PRN
  Administered 2023-09-29 – 2023-09-30 (×2): 2 via ORAL
  Filled 2023-09-29 (×2): qty 2

## 2023-09-29 MED ORDER — DROPERIDOL 2.5 MG/ML IJ SOLN: 0.6250 mg | Freq: Once | INTRAMUSCULAR | Status: DC | PRN

## 2023-09-29 SURGICAL SUPPLY — 47 items
BAG COUNTER SPONGE SURGICOUNT (BAG) IMPLANT
BAG DECANTER FOR FLEXI CONT (MISCELLANEOUS) ×2 IMPLANT
BLADE SAW SGTL 18X1.27X75 (BLADE) ×2 IMPLANT
BOOTIES KNEE HIGH SLOAN (MISCELLANEOUS) ×2 IMPLANT
CELLS DAT CNTRL 66122 CELL SVR (MISCELLANEOUS) ×1 IMPLANT
COVER PERINEAL POST (MISCELLANEOUS) ×2 IMPLANT
COVER SURGICAL LIGHT HANDLE (MISCELLANEOUS) ×2 IMPLANT
CUP ACETABULAR GRIPTON 100 52 (Orthopedic Implant) IMPLANT
DRAPE FOOT SWITCH (DRAPES) ×2 IMPLANT
DRAPE IMP U-DRAPE 54X76 (DRAPES) ×2 IMPLANT
DRAPE STERI IOBAN 125X83 (DRAPES) ×2 IMPLANT
DRAPE U-SHAPE 47X51 STRL (DRAPES) ×2 IMPLANT
DRSG AQUACEL AG ADV 3.5X 6 (GAUZE/BANDAGES/DRESSINGS) ×2 IMPLANT
DRSG XEROFORM 1X8 (GAUZE/BANDAGES/DRESSINGS) IMPLANT
DURAPREP 26ML APPLICATOR (WOUND CARE) ×2 IMPLANT
ELECT BLADE TIP CTD 4 INCH (ELECTRODE) ×2 IMPLANT
ELECT PENCIL ROCKER SW 15FT (MISCELLANEOUS) ×2 IMPLANT
ELECT REM PT RETURN 15FT ADLT (MISCELLANEOUS) ×2 IMPLANT
ELIMINATOR HOLE APEX DEPUY (Hips) IMPLANT
GLOVE BIO SURGEON STRL SZ8 (GLOVE) ×4 IMPLANT
GLOVE BIOGEL PI IND STRL 7.0 (GLOVE) ×2 IMPLANT
GLOVE BIOGEL PI IND STRL 8 (GLOVE) ×4 IMPLANT
GLOVE SURG SYN 7.0 PF PI (GLOVE) ×2 IMPLANT
GOWN SRG XL LVL 4 BRTHBL STRL (GOWNS) ×2 IMPLANT
GOWN STRL REUS W/ TWL XL LVL3 (GOWN DISPOSABLE) ×4 IMPLANT
HEAD FEM CER ARTIC -2 36 12/14 (Head) IMPLANT
HOLDER FOLEY CATH W/STRAP (MISCELLANEOUS) ×2 IMPLANT
KIT TURNOVER KIT A (KITS) ×2 IMPLANT
LINER NEUTRAL 52X36MM PLUS 4 (Liner) IMPLANT
MANIFOLD NEPTUNE II (INSTRUMENTS) ×2 IMPLANT
NDL HYPO 22X1.5 SAFETY MO (MISCELLANEOUS) ×2 IMPLANT
NEEDLE HYPO 22X1.5 SAFETY MO (MISCELLANEOUS) ×1 IMPLANT
NS IRRIG 1000ML POUR BTL (IV SOLUTION) ×2 IMPLANT
PACK ANTERIOR HIP CUSTOM (KITS) ×2 IMPLANT
PROTECTOR NERVE ULNAR (MISCELLANEOUS) ×2 IMPLANT
RETRACTOR WND ALEXIS 18 MED (MISCELLANEOUS) ×2 IMPLANT
SPIKE FLUID TRANSFER (MISCELLANEOUS) ×2 IMPLANT
STEM FEMORAL SZ 6MM STD ACTIS (Stem) IMPLANT
STRIP CLOSURE SKIN 1/2X4 (GAUZE/BANDAGES/DRESSINGS) IMPLANT
SUT ETHIBOND NAB CT1 #1 30IN (SUTURE) ×4 IMPLANT
SUT VIC AB 1 CT1 36 (SUTURE) ×2 IMPLANT
SUT VIC AB 2-0 CT1 TAPERPNT 27 (SUTURE) ×2 IMPLANT
SUT VICRYL AB 3-0 FS1 BRD 27IN (SUTURE) ×2 IMPLANT
SUTURE STRATFX 0 PDS 27 VIOLET (SUTURE) ×2 IMPLANT
SYR 50ML LL SCALE MARK (SYRINGE) ×2 IMPLANT
TRAY FOLEY MTR SLVR 16FR STAT (SET/KITS/TRAYS/PACK) ×2 IMPLANT
YANKAUER SUCT BULB TIP NO VENT (SUCTIONS) ×2 IMPLANT

## 2023-09-29 NOTE — Anesthesia Procedure Notes (Signed)
 Procedure Name: Intubation Date/Time: 09/29/2023 7:49 AM  Performed by: [place], Corean BROCKS, CRNAPre-anesthesia Checklist: Patient identified, Emergency Drugs available, Suction available and Patient being monitored Patient Re-evaluated:Patient Re-evaluated prior to induction Oxygen Delivery Method: Circle system utilized Preoxygenation: Pre-oxygenation with 100% oxygen Induction Type: IV induction Ventilation: Mask ventilation without difficulty Laryngoscope Size: Mac, Glidescope and 4 Grade View: Grade II Tube type: Oral Number of attempts: 1 Airway Equipment and Method: Oral airway and Rigid stylet Placement Confirmation: ETT inserted through vocal cords under direct vision, positive ETCO2 and breath sounds checked- equal and bilateral Secured at: 22 cm Tube secured with: Tape Dental Injury: Teeth and Oropharynx as per pre-operative assessment  Difficulty Due To: Difficulty was anticipated

## 2023-09-29 NOTE — Op Note (Signed)
 PRE-OP DIAGNOSIS:  RIGHT HIP DEGENERATIVE JOINT DISEASE POST-OP DIAGNOSIS:  same PROCEDURE: RIGHT TOTAL HIP ARTHROPLASTY ANTERIOR APPROACH ANESTHESIA:  General SURGEON:  Maude Herald MD ASSISTANT:  Prentice Earl PA-C   INDICATIONS FOR PROCEDURE:  The patient is a 68 y.o. male with a long history of a painful hip.  This has persisted despite multiple conservative measures.  The patient has persisted with pain and dysfunction making rest and activity difficult.  A total hip replacement is offered as surgical treatment.  Informed operative consent was obtained after discussion of possible complications including reaction to anesthesia, infection, neurovascular injury, dislocation, DVT, PE, and death.  The importance of the postoperative rehab program to optimize result was stressed with the patient.  SUMMARY OF FINDINGS AND PROCEDURE:  Under the above anesthesia through a anterior approach an the Hana table a right THR was performed.  The patient had severe degenerative change and fair bone quality.  We used DePuy components to replace the hip and these were size 6 Actis femur capped with a -1 36mm ceramic hip ball.  On the acetabular side we used a size 52 Gription shell with a  plus 4 neutral polyethylene liner.  We did use a hole eliminator.  Prentice Earl PA-C assisted throughout and was invaluable to the completion of the case in that he helped position and retract while I performed the procedure.  He also closed simultaneously to help minimize OR time.  I used fluoroscopy throughout the case to check position of implants and leg lengths and read all of these views myself.  DESCRIPTION OF PROCEDURE:  The patient was taken to the OR suite where the above anesthetic was applied.  The patient was then positioned on the Hana table supine.  All bony prominences were appropriately padded.  Prep and drape was then performed in normal sterile fashion.  The patient was given kefzol  preoperative antibiotic and an  appropriate time out was performed.  We then took an anterior approach to the right hip.  Dissection was taken through adipose to the tensor fascia lata fascia.  This structure was incised longitudinally and we dissected in the intermuscular interval just medial to this muscle.  Cobra retractors were placed superior and inferior to the femoral neck superficial to the capsule.  A capsular incision was then made and the retractors were placed along the femoral neck.  Xray was brought in to get a good level for the femoral neck cut which was made with an oscillating saw and osteotome.  The femoral head was removed with a corkscrew.  The acetabulum was exposed and some labral tissues were excised. Reaming was taken to the inside wall of the pelvis and sequentially up to 1 mm smaller than the actual component.  A trial of components was done and then the aforementioned acetabular shell was placed in appropriate tilt and anteversion confirmed by fluoroscopy. The liner was placed along with the hole eliminator and attention was turned to the femur.  The leg was brought down and over into adduction and the elevator bar was used to raise the femur up gently in the wound.  The piriformis was released with care taken to preserve the obturator internus attachment and all of the posterior capsule. The femur was reamed and then broached to the appropriate size.  A trial reduction was done and the aforementioned head and neck assembly gave us  the best stability in extension with external rotation.  Leg lengths were felt to be about equal by fluoroscopic  exam.  The trial components were removed and the wound irrigated.  We then placed the femoral component in appropriate anteversion.  The head was applied to a dry stem neck and the hip again reduced.  It was again stable in the aforementioned position.  The would was irrigated again followed by re-approximation of anterior capsule with ethibond suture. Tensor fascia was repaired  with V-loc suture  followed by deep closure with #O and #2 undyed vicryl.  Skin was closed with subQ stitch and steristrips followed by a sterile dressing.  EBL and IOF can be obtained from anesthesia records.  DISPOSITION:  The patient was taken to PACU in stable condition to potentially go home same day depending on ability to walk and tolerate liquids.

## 2023-09-29 NOTE — Transfer of Care (Signed)
 Immediate Anesthesia Transfer of Care Note  Patient: Scott [name]  Procedure(s) Performed: ARTHROPLASTY, HIP, TOTAL, ANTERIOR APPROACH (Right: Hip)  Patient Location: PACU  Anesthesia Type:General  Level of Consciousness: awake, alert , and oriented  Airway & Oxygen Therapy: Patient Spontanous Breathing  Post-op Assessment: Report given to RN and Post -op Vital signs reviewed and stable  Post vital signs: Reviewed and stable  Last Vitals:  Vitals Value Taken Time  BP 133/72 09/29/23 09:51  Temp 36.8 C 09/29/23 09:51  Pulse 78 09/29/23 09:55  Resp 17 09/29/23 09:55  SpO2 97 % 09/29/23 09:55  Vitals shown include unfiled device data.  Last Pain:  Vitals:   09/29/23 0606  TempSrc:   PainSc: 7       Patients Stated Pain Goal: 4 (09/29/23 0606)  Complications: No notable events documented.

## 2023-09-29 NOTE — Interval H&P Note (Signed)
 History and Physical Interval Note:  09/29/2023 7:25 AM  Scott [name]  has presented today for surgery, with the diagnosis of RIGHT HIP DEGENERATIVE JOINT DISEASE.  The various methods of treatment have been discussed with the patient and family. After consideration of risks, benefits and other options for treatment, the patient has consented to  Procedure(s): ARTHROPLASTY, HIP, TOTAL, ANTERIOR APPROACH (Right) as a surgical intervention.  The patient's history has been reviewed, patient examined, no change in status, stable for surgery.  I have reviewed the patient's chart and labs.  Questions were answered to the patient's satisfaction.     Braeden Kennan G Emberlyn Burlison

## 2023-09-29 NOTE — Evaluation (Signed)
 Physical Therapy Evaluation Patient Details Name: Scott [name] MRN: [id number] DOB: 1955-10-04 Today's Date: 09/29/2023  History of Present Illness  68 yo male presents to therapy s/p R THA, anterior approach on 09/29/2023 due to failure of conservative measures. Pt PMH includes but is not limited to: LBP, GI bleed, bradycardia, CKD, OA, R TKA (2021), angina, cervical pain stenosis with myelopathy s/p ACDF, falls, s/p CABG, CAD, PAF, s and dCHF, COPD, OSA, anxiety, HTN, HLD, anxiety, CKD, DOE, and loop recorder implant.  Clinical Impression   Scott [name] is a 68 y.o. male POD 0 s/p R THA. Patient reports IND with mobility at baseline. Patient is now limited by functional impairments (see PT problem list below) and requires CGA for bed mobility and CGA for transfers. Patient was unable to ambulate at time of eval due to N and V. Pt stated he felt better standing for a little bit and able to participate with standing LE TE with SBA and cues with B UE support at RW.  Patient will benefit from continued skilled PT interventions to address impairments and progress towards PLOF. Acute PT will follow to progress mobility and stair training in preparation for safe discharge home with family and social support with [facility] services.        If plan is discharge home, recommend the following: A little help with walking and/or transfers;A little help with bathing/dressing/bathroom;Assistance with cooking/housework;Assist for transportation;Help with stairs or ramp for entrance   Can travel by private vehicle        Equipment Recommendations None recommended by PT  Recommendations for Other Services       Functional Status Assessment Patient has had a recent decline in their functional status and demonstrates the ability to make significant improvements in function in a reasonable and predictable amount of time.     Precautions / Restrictions Precautions Precautions: Fall Restrictions Weight  Bearing Restrictions Per Provider Order: Yes RLE Weight Bearing Per Provider Order: Weight bearing as tolerated      Mobility  Bed Mobility Overal bed mobility: Needs Assistance Bed Mobility: Supine to Sit     Supine to sit: Contact guard, HOB elevated, Used rails     General bed mobility comments: min cues    Transfers Overall transfer level: Needs assistance Equipment used: Rolling walker (2 wheels) Transfers: Sit to/from Stand, Bed to chair/wheelchair/BSC Sit to Stand: Contact guard assist Stand pivot transfers: Contact guard assist         General transfer comment: min cues    Ambulation/Gait               General Gait Details: NT due to N and V  Stairs            Wheelchair Mobility     Tilt Bed    Modified Rankin (Stroke Patients Only)       Balance Overall balance assessment: Needs assistance Sitting-balance support: Feet supported Sitting balance-Leahy Scale: Good     Standing balance support: Bilateral upper extremity supported, During functional activity, Reliant on assistive device for balance Standing balance-Leahy Scale: Fair Standing balance comment: static standing no UE support                             Pertinent Vitals/Pain Pain Assessment Pain Assessment: 0-10 Pain Score: 7  Pain Location: R hip and LE Pain Descriptors / Indicators: Aching, Constant, Discomfort, Grimacing, Operative site guarding Pain Intervention(s): Limited  activity within patient's tolerance, Monitored during session, Premedicated before session, Patient requesting pain meds-RN notified, Ice applied    Home Living Family/patient expects to be discharged to:: Private residence Living Arrangements: Alone Available Help at Discharge: Family;Friend(s) Type of Home: House Home Access: Stairs to enter Entrance Stairs-Rails: Left Entrance Stairs-Number of Steps: 2   Home Layout: One level Home Equipment: Agricultural consultant (2 wheels);Cane -  single point Additional Comments: 2 large dogs    Prior Function Prior Level of Function : Independent/Modified Independent;Driving             Mobility Comments: IND no AD for all ADLs, self care tasks and IADLS       Extremity/Trunk Assessment        Lower Extremity Assessment Lower Extremity Assessment: RLE deficits/detail RLE Deficits / Details: ankle DF/PF 5/5 RLE Sensation: WNL    Cervical / Trunk Assessment Cervical / Trunk Assessment: Normal  Communication   Communication Communication: No apparent difficulties    Cognition Arousal: Alert Behavior During Therapy: WFL for tasks assessed/performed   PT - Cognitive impairments: No apparent impairments                         Following commands: Intact       Cueing       General Comments      Exercises Total Joint Exercises Ankle Circles/Pumps: AROM, Both, 10 reps Hip ABduction/ADduction: AROM, 5 reps, Standing, Right Knee Flexion: AROM, Right, 5 reps, Standing Standing Hip Extension: AROM, Right, 5 reps, Standing   Assessment/Plan    PT Assessment Patient needs continued PT services  PT Problem List Decreased range of motion;Decreased strength;Decreased activity tolerance;Decreased balance;Decreased mobility;Decreased coordination;Pain       PT Treatment Interventions DME instruction;Gait training;Stair training;Functional mobility training;Therapeutic activities;Therapeutic exercise;Balance training;Neuromuscular re-education;Patient/family education;Modalities    PT Goals (Current goals can be found in the Care Plan section)  Acute Rehab PT Goals Patient Stated Goal: to be able to walk the dogs without pain PT Goal Formulation: With patient Time For Goal Achievement: 10/13/23 Potential to Achieve Goals: Good    Frequency 7X/week     Co-evaluation               AM-PAC PT 6 Clicks Mobility  Outcome Measure Help needed turning from your back to your side while in a  flat bed without using bedrails?: A Little Help needed moving from lying on your back to sitting on the side of a flat bed without using bedrails?: A Little Help needed moving to and from a bed to a chair (including a wheelchair)?: A Little Help needed standing up from a chair using your arms (e.g., wheelchair or bedside chair)?: A Little Help needed to walk in hospital room?: A Lot Help needed climbing 3-5 steps with a railing? : Total 6 Click Score: 15    End of Session Equipment Utilized During Treatment: Gait belt Activity Tolerance: Other (comment) (N and V) Patient left: in chair;with call bell/phone within reach;with chair alarm set Nurse Communication: Mobility status;Other (comment) (N and V) PT Visit Diagnosis: Unsteadiness on feet (R26.81);Muscle weakness (generalized) (M62.81);Difficulty in walking, not elsewhere classified (R26.2);Pain Pain - Right/Left: Right Pain - part of body: Leg;Hip    Time: 8383-8354 PT Time Calculation (min) (ACUTE ONLY): 29 min   Charges:   PT Evaluation $PT Eval Low Complexity: 1 Low PT Treatments $Therapeutic Activity: 8-22 mins PT General Charges $$ ACUTE PT VISIT: 1 Visit  [place], PT Acute Rehab   [place] VEAR Drone 09/29/2023, 4:55 PM

## 2023-09-29 NOTE — Progress Notes (Signed)
 PT Cancellation Note  Patient Details Name: Scott [name] MRN: [id number] DOB: October 09, 1955   Cancelled Treatment:    Reason Eval/Treat Not Completed: Fatigue/lethargy and nausea limiting ability to participate. Pt had been up and OOB and amb to bathroom with nursing assist. PT to return as schedule allows and continue to follow acutely.   [place], PT Acute Rehab   [place] VEAR Drone 09/29/2023, 2:24 PM

## 2023-09-30 ENCOUNTER — Emergency Department (HOSPITAL_COMMUNITY)
Admission: EM | Admit: 2023-09-30 | Discharge: 2023-09-30 | Disposition: A | Discharge status: Home / Self Care | Attending: Emergency Medicine | Admitting: Emergency Medicine

## 2023-09-30 ENCOUNTER — Emergency Department (HOSPITAL_COMMUNITY)

## 2023-09-30 ENCOUNTER — Encounter (HOSPITAL_COMMUNITY): Payer: Self-pay | Admitting: Orthopaedic Surgery

## 2023-09-30 ENCOUNTER — Other Ambulatory Visit: Payer: Self-pay

## 2023-09-30 DIAGNOSIS — N189 Chronic kidney disease, unspecified: Secondary | ICD-10-CM | POA: Insufficient documentation

## 2023-09-30 DIAGNOSIS — M25559 Pain in unspecified hip: Secondary | ICD-10-CM | POA: Diagnosis not present

## 2023-09-30 DIAGNOSIS — I509 Heart failure, unspecified: Secondary | ICD-10-CM | POA: Insufficient documentation

## 2023-09-30 DIAGNOSIS — Z951 Presence of aortocoronary bypass graft: Secondary | ICD-10-CM | POA: Insufficient documentation

## 2023-09-30 DIAGNOSIS — J449 Chronic obstructive pulmonary disease, unspecified: Secondary | ICD-10-CM | POA: Insufficient documentation

## 2023-09-30 DIAGNOSIS — Z7901 Long term (current) use of anticoagulants: Secondary | ICD-10-CM | POA: Insufficient documentation

## 2023-09-30 DIAGNOSIS — Z79899 Other long term (current) drug therapy: Secondary | ICD-10-CM | POA: Insufficient documentation

## 2023-09-30 DIAGNOSIS — M25551 Pain in right hip: Secondary | ICD-10-CM | POA: Diagnosis not present

## 2023-09-30 DIAGNOSIS — Z96641 Presence of right artificial hip joint: Secondary | ICD-10-CM | POA: Diagnosis not present

## 2023-09-30 DIAGNOSIS — I2510 Atherosclerotic heart disease of native coronary artery without angina pectoris: Secondary | ICD-10-CM | POA: Insufficient documentation

## 2023-09-30 DIAGNOSIS — I130 Hypertensive heart and chronic kidney disease with heart failure and stage 1 through stage 4 chronic kidney disease, or unspecified chronic kidney disease: Secondary | ICD-10-CM | POA: Insufficient documentation

## 2023-09-30 DIAGNOSIS — Z471 Aftercare following joint replacement surgery: Secondary | ICD-10-CM | POA: Diagnosis not present

## 2023-09-30 LAB — BASIC METABOLIC PANEL WITH GFR
Anion gap: 11 (ref 5–15)
BUN: 31 mg/dL — ABNORMAL HIGH (ref 8–23)
CO2: 22 mmol/L (ref 22–32)
Calcium: 8.2 mg/dL — ABNORMAL LOW (ref 8.9–10.3)
Chloride: 103 mmol/L (ref 98–111)
Creatinine, Ser: 1.71 mg/dL — ABNORMAL HIGH (ref 0.61–1.24)
GFR, Estimated: 43 mL/min — ABNORMAL LOW (ref >60)
Glucose, Bld: 146 mg/dL — ABNORMAL HIGH (ref 70–99)
Potassium: 4.5 mmol/L (ref 3.5–5.1)
Sodium: 136 mmol/L (ref 135–145)

## 2023-09-30 LAB — CBC
HCT: 32.1 % — ABNORMAL LOW (ref 39.0–52.0)
Hemoglobin: 10.2 g/dL — ABNORMAL LOW (ref 13.0–17.0)
MCH: 30.9 pg (ref 26.0–34.0)
MCHC: 31.8 g/dL (ref 30.0–36.0)
MCV: 97.3 fL (ref 80.0–100.0)
Platelets: 164 K/uL (ref 150–400)
RBC: 3.3 MIL/uL — ABNORMAL LOW (ref 4.22–5.81)
RDW: 13.5 % (ref 11.5–15.5)
WBC: 11 K/uL — ABNORMAL HIGH (ref 4.0–10.5)
nRBC: 0 % (ref 0.0–0.2)

## 2023-09-30 MED ORDER — HYDROCODONE-ACETAMINOPHEN 5-325 MG PO TABS: 1.0000 | ORAL_TABLET | Freq: Four times a day (QID) | ORAL | 0 refills | Status: DC | PRN

## 2023-09-30 MED ORDER — HYDROCHLOROTHIAZIDE 12.5 MG PO TABS
12.5000 mg | ORAL_TABLET | Freq: Every day | ORAL | Status: DC
  Administered 2023-09-30: 12.5 mg via ORAL
  Filled 2023-09-30: qty 1

## 2023-09-30 MED ORDER — MORPHINE SULFATE (PF) 4 MG/ML IV SOLN
4.0000 mg | Freq: Once | INTRAVENOUS | Status: AC
  Administered 2023-09-30: 4 mg via INTRAVENOUS
  Filled 2023-09-30: qty 1

## 2023-09-30 MED ORDER — ONDANSETRON HCL 4 MG/2ML IJ SOLN
4.0000 mg | Freq: Once | INTRAMUSCULAR | Status: AC
  Administered 2023-09-30: 4 mg via INTRAVENOUS
  Filled 2023-09-30: qty 2

## 2023-09-30 MED ORDER — TIZANIDINE HCL 4 MG PO TABS: 4.0000 mg | ORAL_TABLET | Freq: Four times a day (QID) | ORAL | 1 refills | Status: AC | PRN

## 2023-09-30 NOTE — TOC Transition Note (Signed)
 Transition of Care [facility]) - Discharge Note   Patient Details  Name: Scott [name] MRN: [id number] Date of Birth: 1955-06-12  [facility]) CM/SW Contact:  Alfonse JONELLE Rex, RN Phone Number: 09/30/2023, 11:00 AM   Clinical Narrative: Met with patient at bedside to review dc therapy and home equipment needs. NCM confirmed with Ortho PA plan OPPT at office this Friday at 9:20am, has RW, no home DME needs. MOON completed. No TOC needs.       Final next level of care: OP Rehab Barriers to Discharge: No Barriers Identified   Patient Goals and CMS Choice Patient states their goals for this hospitalization and ongoing recovery are:: return home          Discharge Placement                       Discharge Plan and Services Additional resources added to the After Visit Summary for                                       Social Drivers of Health (SDOH) Interventions SDOH Screenings   Food Insecurity: No Food Insecurity (09/29/2023)  Housing: Low Risk  (09/29/2023)  Transportation Needs: No Transportation Needs (09/29/2023)  Utilities: Not At Risk (09/29/2023)  Alcohol Screen: Low Risk  (03/02/2023)  Depression (PHQ2-9): Low Risk  (04/08/2023)  Financial Resource Strain: Low Risk  (03/02/2023)  Physical Activity: Sufficiently Active (03/02/2023)  Social Connections: Socially Isolated (09/29/2023)  Stress: No Stress Concern Present (03/02/2023)  Tobacco Use: Medium Risk (09/29/2023)  Health Literacy: Adequate Health Literacy (03/02/2023)     Readmission Risk Interventions     No data to display

## 2023-09-30 NOTE — Discharge Summary (Signed)
 Patient ID: Scott [name] MRN: [id number] DOB/AGE: [birth year] 68 y.o.  Admit date: 09/29/2023 Discharge date: 09/30/2023  Admission Diagnoses:  Principal Problem:   Primary localized osteoarthritis of right hip Active Problems:   Primary osteoarthritis of right hip   Discharge Diagnoses:  Same  Past Medical History:  Diagnosis Date   ALLERGIC RHINITIS 01/14/2007   Allergy    ANXIETY 09/28/2006   Asbestos exposure    Atrial fibrillation (HCC) 09/28/2006   s/p afib ablation x 2   CHF (congestive heart failure) (HCC)    CKD (chronic kidney disease), stage III (HCC)    patient denies   COPD (chronic obstructive pulmonary disease) (HCC)    Coronary artery disease 11/24/2011   Diastolic dysfunction 09/28/2006   Dyspnea    W/ EXERTION    Dysrhythmia    AFIB      First degree AV block 10/12/2018   Noted on EKG   Gallstones    Hyperglycemia    HYPERLIPIDEMIA 09/28/2006   HYPERTENSION 09/28/2006   HYPOTHYROIDISM, ACQUIRED NEC 09/28/2006   pt denies and doesn't know   Long term current use of anticoagulant 04/08/2010   Obese    OBSTRUCTIVE SLEEP APNEA 01/06/2008   NOT USING     Right knee DJD 07/09/2010   S/P CABG x 1 11/26/2011   Right internal mammary artery to right coronary artery   S/P Maze operation for atrial fibrillation 11/26/2011   complete biatrial lesion set with clipping of LA appendage    Surgeries: Procedure(s): ARTHROPLASTY, HIP, TOTAL, ANTERIOR APPROACH on 09/29/2023   Consultants:   Discharged Condition: Improved  Hospital Course: Loras Grieshop is an 68 y.o. male who was admitted 09/29/2023 for operative treatment ofPrimary localized osteoarthritis of right hip. Patient has severe unremitting pain that affects sleep, daily activities, and work/hobbies. After pre-op clearance the patient was taken to the operating room on 09/29/2023 and underwent  Procedure(s): ARTHROPLASTY, HIP, TOTAL, ANTERIOR APPROACH.    Patient was given perioperative  antibiotics:  Anti-infectives (From admission, onward)    Start     Dose/Rate Route Frequency Ordered Stop   09/29/23 1400  ceFAZolin  (ANCEF ) IVPB 2g/100 mL premix        2 g 200 mL/hr over 30 Minutes Intravenous Every 6 hours 09/29/23 1128 09/29/23 2108   09/29/23 0600  ceFAZolin  (ANCEF ) IVPB 2g/100 mL premix        2 g 200 mL/hr over 30 Minutes Intravenous On call to O.R. 09/29/23 [id number] 09/29/23 0750        Patient was given sequential compression devices, early ambulation, and chemoprophylaxis to prevent DVT.  Patient benefited maximally from hospital stay and there were no complications.    Recent vital signs: Patient Vitals for the past 24 hrs:  BP Temp Temp src Pulse Resp SpO2  09/30/23 0738 -- -- -- -- -- 100 %  09/30/23 0558 (!) 141/71 97.7 F (36.5 C) Oral 97 18 99 %  09/30/23 0154 122/67 98.4 F (36.9 C) Oral 89 18 95 %  09/29/23 2030 114/68 97.8 F (36.6 C) Oral 91 18 97 %  09/29/23 1547 116/66 97.7 F (36.5 C) Oral 89 18 97 %  09/29/23 1144 116/60 97.8 F (36.6 C) Oral 84 15 92 %  09/29/23 1115 123/65 -- -- 84 10 95 %  09/29/23 1100 (!) 111/58 -- -- 81 (!) 7 93 %  09/29/23 1045 122/66 -- -- 80 12 96 %  09/29/23 1030 (!) 109/54 -- -- 81 14 97 %  09/29/23 1015 124/68 -- -- 80 15 98 %  09/29/23 1000 131/72 -- -- 80 13 94 %  09/29/23 0951 133/72 98.3 F (36.8 C) -- 84 (!) 8 100 %     Recent laboratory studies:  Recent Labs    09/30/23 0350  WBC 11.0*  HGB 10.2*  HCT 32.1*  PLT 164  NA 136  K 4.5  CL 103  CO2 22  BUN 31*  CREATININE 1.71*  GLUCOSE 146*  CALCIUM  8.2*     Discharge Medications:   Allergies as of 09/30/2023       Reactions   Zolpidem Tartrate Other (See Comments)   Extremely drowsy the next day        Medication List     STOP taking these medications    traMADol  50 MG tablet Commonly known as: ULTRAM        TAKE these medications    acetaminophen  500 MG tablet Commonly known as: TYLENOL  Take 1,000 mg by mouth every  6 (six) hours as needed for mild pain.   albuterol  108 (90 Base) MCG/ACT inhaler Commonly known as: VENTOLIN  HFA TAKE 2 PUFFS BY MOUTH EVERY 6 HOURS AS NEEDED FOR WHEEZE OR SHORTNESS OF BREATH   allopurinol  100 MG tablet Commonly known as: ZYLOPRIM  Take 1 tablet (100 mg total) by mouth daily. To lower uric acid and prevent gout   ALPRAZolam  0.5 MG tablet Commonly known as: XANAX  TAKE 1 TABLET BY MOUTH TWICE A DAY AS NEEDED FOR ANXIETY   amLODipine  5 MG tablet Commonly known as: NORVASC  Take 1 tablet (5 mg total) by mouth daily.   cholecalciferol  1000 units tablet Commonly known as: VITAMIN D  Take 1,000 Units by mouth daily.   clotrimazole -betamethasone  cream Commonly known as: LOTRISONE  Apply 1 Application topically daily.   co-enzyme Q-10 30 MG capsule Take 30 mg by mouth daily.   colchicine  0.6 MG tablet Take 1 tablet (0.6 mg total) by mouth daily as needed (gout pain).   cyclobenzaprine  5 MG tablet Commonly known as: FLEXERIL  Take 1 tablet (5 mg total) by mouth 3 (three) times daily as needed for muscle spasms.   docusate sodium  100 MG capsule Commonly known as: COLACE Take 200 mg by mouth daily as needed for mild constipation.   donepezil  5 MG tablet Commonly known as: ARICEPT  Take 1 tablet (5 mg total) by mouth at bedtime.   Eliquis  5 MG Tabs tablet Generic drug: apixaban  TAKE 1 TABLET BY MOUTH TWICE A DAY   furosemide  20 MG tablet Commonly known as: LASIX  TAKE 1 TABLET BY MOUTH EVERY DAY AS NEEDED   gabapentin  100 MG capsule Commonly known as: NEURONTIN  TAKE 1 CAPSULE BY MOUTH THREE TIMES A DAY   hydrochlorothiazide  12.5 MG capsule Commonly known as: MICROZIDE  Take 1 capsule (12.5 mg total) by mouth daily.   HYDROcodone -acetaminophen  5-325 MG tablet Commonly known as: NORCO/VICODIN Take 1-2 tablets by mouth every 6 (six) hours as needed for moderate pain (pain score 4-6) or severe pain (pain score 7-10) (post op pain).   hydrocortisone  2.5 %  rectal cream Commonly known as: Anusol -HC Place 1 Application rectally at bedtime.   lovastatin  40 MG tablet Commonly known as: MEVACOR  Take 1 tablet (40 mg total) by mouth daily.   metoprolol  succinate 50 MG 24 hr tablet Commonly known as: TOPROL -XL TAKE 1 TABLETS once DAILY BY MOUTH   multivitamin with minerals Tabs tablet Take 1 tablet by mouth daily.   NON FORMULARY Pt uses cpap nightly   olmesartan  40  MG tablet Commonly known as: BENICAR  Take 1 tablet (40 mg total) by mouth daily.   spironolactone  25 MG tablet Commonly known as: ALDACTONE  TAKE 0.5 TABLETS BY MOUTH AT BEDTIME.   temazepam  30 MG capsule Commonly known as: RESTORIL  TAKE 1 CAPSULE (30 MG TOTAL) BY MOUTH AT BEDTIME AS NEEDED. FOR SLEEP What changed:  when to take this additional instructions   tiZANidine  4 MG tablet Commonly known as: Zanaflex  Take 1 tablet (4 mg total) by mouth every 6 (six) hours as needed for muscle spasms.   Trelegy Ellipta  100-62.5-25 MCG/ACT Aepb Generic drug: Fluticasone -Umeclidin-Vilant Take 1 inhalation once daily               Durable Medical Equipment  (From admission, onward)           Start     Ordered   09/29/23 1129  DME Walker rolling  Once       Question:  Patient needs a walker to treat with the following condition  Answer:  Primary osteoarthritis of right hip   09/29/23 1128   09/29/23 1129  DME 3 n 1  Once        09/29/23 1128   09/29/23 1129  DME Bedside commode  Once       Question:  Patient needs a bedside commode to treat with the following condition  Answer:  Primary osteoarthritis of right hip   09/29/23 1128            Diagnostic Studies: DG HIP UNILAT WITH PELVIS 1V RIGHT Result Date: 09/29/2023 CLINICAL DATA:  Elective surgery. EXAM: DG HIP (WITH OR WITHOUT PELVIS) 1V RIGHT COMPARISON:  None Available. FINDINGS: Two fluoroscopic spot views of the pelvis and right hip obtained in the operating room. Images during hip arthroplasty.  Fluoroscopy time 24 seconds. Dose 4.5755 mGy. IMPRESSION: Intraoperative fluoroscopy during right hip arthroplasty. Electronically Signed   By: Andrea Gasman M.D.   On: 09/29/2023 13:12   DG C-Arm 1-60 Min-No Report Result Date: 09/29/2023 Fluoroscopy was utilized by the requesting physician.  No radiographic interpretation.   DG C-Arm 1-60 Min-No Report Result Date: 09/29/2023 Fluoroscopy was utilized by the requesting physician.  No radiographic interpretation.   DG Chest 2 View Result Date: 09/23/2023 CLINICAL DATA:  Preop chest exam, upcoming hip surgery. EXAM: CHEST - 2 VIEW COMPARISON:  Radiograph 09/06/2018 FINDINGS: Prior median sternotomy with left atrial clipping.The cardiomediastinal contours are stable from prior exam. The lungs are clear. Pulmonary vasculature is normal. No consolidation, pleural effusion, or pneumothorax. No acute osseous abnormalities are seen. Thoracic spondylosis. Left chest wall loop recorder. IMPRESSION: 1. No acute chest findings. 2. Prior median sternotomy and left atrial clipping. Electronically Signed   By: Andrea Gasman M.D.   On: 09/23/2023 16:56    Disposition: Discharge disposition: 01-Home or Self Care       Discharge Instructions     Call MD / Call 911   Complete by: As directed    If you experience chest pain or shortness of breath, CALL 911 and be transported to the hospital emergency room.  If you develope a fever above 101 F, pus (white drainage) or increased drainage or redness at the wound, or calf pain, call your surgeon's office.   Constipation Prevention   Complete by: As directed    Drink plenty of fluids.  Prune juice may be helpful.  You may use a stool softener, such as Colace (over the counter) 100 mg twice a day.  Use MiraLax (  over the counter) for constipation as needed.   Diet - low sodium heart healthy   Complete by: As directed    Discharge instructions   Complete by: As directed    INSTRUCTIONS AFTER JOINT  REPLACEMENT   Remove items at home which could result in a fall. This includes throw rugs or furniture in walking pathways ICE to the affected joint every three hours while awake for 30 minutes at a time, for at least the first 3-5 days, and then as needed for pain and swelling.  Continue to use ice for pain and swelling. You may notice swelling that will progress down to the foot and ankle.  This is normal after surgery.  Elevate your leg when you are not up walking on it.   Continue to use the breathing machine you got in the hospital (incentive spirometer) which will help keep your temperature down.  It is common for your temperature to cycle up and down following surgery, especially at night when you are not up moving around and exerting yourself.  The breathing machine keeps your lungs expanded and your temperature down.   DIET:  As you were doing prior to hospitalization, we recommend a well-balanced diet.  DRESSING / WOUND CARE / SHOWERING  You may shower 3 days after surgery, but keep the wounds dry during showering.  You may use an occlusive plastic wrap (Press'n Seal for example), NO SOAKING/SUBMERGING IN THE BATHTUB.  If the bandage gets wet, change with a clean dry gauze.  If the incision gets wet, pat the wound dry with a clean towel.  ACTIVITY  Increase activity slowly as tolerated, but follow the weight bearing instructions below.   No driving for 6 weeks or until further direction given by your physician.  You cannot drive while taking narcotics.  No lifting or carrying greater than 10 lbs. until further directed by your surgeon. Avoid periods of inactivity such as sitting longer than an hour when not asleep. This helps prevent blood clots.  You may return to work once you are authorized by your doctor.     WEIGHT BEARING   Weight bearing as tolerated with assist device (walker, cane, etc) as directed, use it as long as suggested by your surgeon or therapist, typically at  least 4-6 weeks.   EXERCISES  Results after joint replacement surgery are often greatly improved when you follow the exercise, range of motion and muscle strengthening exercises prescribed by your doctor. Safety measures are also important to protect the joint from further injury. Any time any of these exercises cause you to have increased pain or swelling, decrease what you are doing until you are comfortable again and then slowly increase them. If you have problems or questions, call your caregiver or physical therapist for advice.   Rehabilitation is important following a joint replacement. After just a few days of immobilization, the muscles of the leg can become weakened and shrink (atrophy).  These exercises are designed to build up the tone and strength of the thigh and leg muscles and to improve motion. Often times heat used for twenty to thirty minutes before working out will loosen up your tissues and help with improving the range of motion but do not use heat for the first two weeks following surgery (sometimes heat can increase post-operative swelling).   These exercises can be done on a training (exercise) mat, on the floor, on a table or on a bed. Use whatever works the best and is  most comfortable for you.    Use music or television while you are exercising so that the exercises are a pleasant break in your day. This will make your life better with the exercises acting as a break in your routine that you can look forward to.   Perform all exercises about fifteen times, three times per day or as directed.  You should exercise both the operative leg and the other leg as well.  Exercises include:   Quad Sets - Tighten up the muscle on the front of the thigh (Quad) and hold for 5-10 seconds.   Straight Leg Raises - With your knee straight (if you were given a brace, keep it on), lift the leg to 60 degrees, hold for 3 seconds, and slowly lower the leg.  Perform this exercise against  resistance later as your leg gets stronger.  Leg Slides: Lying on your back, slowly slide your foot toward your buttocks, bending your knee up off the floor (only go as far as is comfortable). Then slowly slide your foot back down until your leg is flat on the floor again.  Angel Wings: Lying on your back spread your legs to the side as far apart as you can without causing discomfort.  Hamstring Strength:  Lying on your back, push your heel against the floor with your leg straight by tightening up the muscles of your buttocks.  Repeat, but this time bend your knee to a comfortable angle, and push your heel against the floor.  You may put a pillow under the heel to make it more comfortable if necessary.   A rehabilitation program following joint replacement surgery can speed recovery and prevent re-injury in the future due to weakened muscles. Contact your doctor or a physical therapist for more information on knee rehabilitation.    CONSTIPATION  Constipation is defined medically as fewer than three stools per week and severe constipation as less than one stool per week.  Even if you have a regular bowel pattern at home, your normal regimen is likely to be disrupted due to multiple reasons following surgery.  Combination of anesthesia, postoperative narcotics, change in appetite and fluid intake all can affect your bowels.   YOU MUST use at least one of the following options; they are listed in order of increasing strength to get the job done.  They are all available over the counter, and you may need to use some, POSSIBLY even all of these options:    Drink plenty of fluids (prune juice may be helpful) and high fiber foods Colace 100 mg by mouth twice a day  Senokot for constipation as directed and as needed Dulcolax (bisacodyl ), take with full glass of water  Miralax (polyethylene glycol) once or twice a day as needed.  If you have tried all these things and are unable to have a bowel movement  in the first 3-4 days after surgery call either your surgeon or your primary doctor.    If you experience loose stools or diarrhea, hold the medications until you stool forms back up.  If your symptoms do not get better within 1 week or if they get worse, check with your doctor.  If you experience the worst abdominal pain ever or develop nausea or vomiting, please contact the office immediately for further recommendations for treatment.   ITCHING:  If you experience itching with your medications, try taking only a single pain pill, or even half a pain pill at a time.  You  can also use Benadryl  over the counter for itching or also to help with sleep.   TED HOSE STOCKINGS:  Use stockings on both legs until for at least 2 weeks or as directed by physician office. They may be removed at night for sleeping.  MEDICATIONS:  See your medication summary on the After Visit Summary that nursing will review with you.  You may have some home medications which will be placed on hold until you complete the course of blood thinner medication.  It is important for you to complete the blood thinner medication as prescribed.  PRECAUTIONS:  If you experience chest pain or shortness of breath - call 911 immediately for transfer to the hospital emergency department.   If you develop a fever greater that 101 F, purulent drainage from wound, increased redness or drainage from wound, foul odor from the wound/dressing, or calf pain - CONTACT YOUR SURGEON.                                                   FOLLOW-UP APPOINTMENTS:  If you do not already have a post-op appointment, please call the office for an appointment to be seen by your surgeon.  Guidelines for how soon to be seen are listed in your After Visit Summary, but are typically between 1-4 weeks after surgery.  OTHER INSTRUCTIONS:   Knee Replacement:  Do not place pillow under knee, focus on keeping the knee straight while resting. CPM instructions: 0-90  degrees, 2 hours in the morning, 2 hours in the afternoon, and 2 hours in the evening. Place foam block, curve side up under heel at all times except when in CPM or when walking.  DO NOT modify, tear, cut, or change the foam block in any way.  POST-OPERATIVE OPIOID TAPER INSTRUCTIONS: It is important to wean off of your opioid medication as soon as possible. If you do not need pain medication after your surgery it is ok to stop day one. Opioids include: Codeine, Hydrocodone (Norco, Vicodin), Oxycodone (Percocet, oxycontin ) and hydromorphone  amongst others.  Long term and even short term use of opiods can cause: Increased pain response Dependence Constipation Depression Respiratory depression And more.  Withdrawal symptoms can include Flu like symptoms Nausea, vomiting And more Techniques to manage these symptoms Hydrate well Eat regular healthy meals Stay active Use relaxation techniques(deep breathing, meditating, yoga) Do Not substitute Alcohol to help with tapering If you have been on opioids for less than two weeks and do not have pain than it is ok to stop all together.  Plan to wean off of opioids This plan should start within one week post op of your joint replacement. Maintain the same interval or time between taking each dose and first decrease the dose.  Cut the total daily intake of opioids by one tablet each day Next start to increase the time between doses. The last dose that should be eliminated is the evening dose.     MAKE SURE YOU:  Understand these instructions.  Get help right away if you are not doing well or get worse.    Thank you for letting us  be a part of your medical care team.  It is a privilege we respect greatly.  We hope these instructions will help you stay on track for a fast and full recovery!   Increase activity slowly as  tolerated   Complete by: As directed    Post-operative opioid taper instructions:   Complete by: As directed     POST-OPERATIVE OPIOID TAPER INSTRUCTIONS: It is important to wean off of your opioid medication as soon as possible. If you do not need pain medication after your surgery it is ok to stop day one. Opioids include: Codeine, Hydrocodone (Norco, Vicodin), Oxycodone (Percocet, oxycontin ) and hydromorphone  amongst others.  Long term and even short term use of opiods can cause: Increased pain response Dependence Constipation Depression Respiratory depression And more.  Withdrawal symptoms can include Flu like symptoms Nausea, vomiting And more Techniques to manage these symptoms Hydrate well Eat regular healthy meals Stay active Use relaxation techniques(deep breathing, meditating, yoga) Do Not substitute Alcohol to help with tapering If you have been on opioids for less than two weeks and do not have pain than it is ok to stop all together.  Plan to wean off of opioids This plan should start within one week post op of your joint replacement. Maintain the same interval or time between taking each dose and first decrease the dose.  Cut the total daily intake of opioids by one tablet each day Next start to increase the time between doses. The last dose that should be eliminated is the evening dose.           Follow-up Information     Sheril Coy, MD. Schedule an appointment as soon as possible for a visit in 2 week(s).   Specialty: Orthopedic Surgery Contact information: [address] [place] [address] [phone]                  Signed: Prentice Mt Blayklee Mable 09/30/2023, 8:18 AM

## 2023-09-30 NOTE — ED Provider Notes (Signed)
 [facility] Provider Note   CSN: [id number] Arrival date & time: 09/30/23  1659     Patient presents with: Hip Pain   Scott [name] is a 68 y.o. male.   Pt is a 68 yo male with pmhx significant for hld, anxiety, htn, afib, CAD s/p CABG, CHF, COPD, and CKD.  Pt had a right total hip replacement yesterday.  He was d/c today around 2 pm.  He got home and the pain was terrible.  He could not walk, so he called EMS to bring him back. He does not feel like anything dislocated.          Prior to Admission medications   Medication Sig Start Date End Date Taking? Authorizing Provider  acetaminophen  (TYLENOL ) 500 MG tablet Take 1,000 mg by mouth every 6 (six) hours as needed for mild pain.     [provider]  albuterol  (VENTOLIN  HFA) 108 (90 Base) MCG/ACT inhaler TAKE 2 PUFFS BY MOUTH EVERY 6 HOURS AS NEEDED FOR WHEEZE OR SHORTNESS OF BREATH 08/31/23   Norleen Lynwood ORN, MD  allopurinol  (ZYLOPRIM ) 100 MG tablet Take 1 tablet (100 mg total) by mouth daily. To lower uric acid and prevent gout 04/08/23   Norleen Lynwood ORN, MD  ALPRAZolam  (XANAX ) 0.5 MG tablet TAKE 1 TABLET BY MOUTH TWICE A DAY AS NEEDED FOR ANXIETY 06/01/23   Norleen Lynwood ORN, MD  amLODipine  (NORVASC ) 5 MG tablet Take 1 tablet (5 mg total) by mouth daily. 04/08/23 04/07/24  Norleen Lynwood ORN, MD  cholecalciferol  (VITAMIN D ) 1000 units tablet Take 1,000 Units by mouth daily.    [provider]  clotrimazole -betamethasone  (LOTRISONE ) cream Apply 1 Application topically daily. Patient not taking: Reported on 09/16/2023 11/14/21   Norleen Lynwood ORN, MD  co-enzyme Q-10 30 MG capsule Take 30 mg by mouth daily.    [provider]  colchicine  0.6 MG tablet Take 1 tablet (0.6 mg total) by mouth daily as needed (gout pain). 04/08/23   Norleen Lynwood ORN, MD  cyclobenzaprine  (FLEXERIL ) 5 MG tablet Take 1 tablet (5 mg total) by mouth 3 (three) times daily as needed for muscle spasms. Patient not  taking: Reported on 09/16/2023 01/01/21   Norleen Lynwood ORN, MD  docusate sodium  (COLACE) 100 MG capsule Take 200 mg by mouth daily as needed for mild constipation.     [provider]  donepezil  (ARICEPT ) 5 MG tablet Take 1 tablet (5 mg total) by mouth at bedtime. 04/08/23   Norleen Lynwood ORN, MD  ELIQUIS  5 MG TABS tablet TAKE 1 TABLET BY MOUTH TWICE A DAY 10/21/22   Norleen Lynwood ORN, MD  Fluticasone -Umeclidin-Vilant (TRELEGY ELLIPTA ) 100-62.5-25 MCG/ACT AEPB Take 1 inhalation once daily 04/08/23   Norleen Lynwood ORN, MD  furosemide  (LASIX ) 20 MG tablet TAKE 1 TABLET BY MOUTH EVERY DAY AS NEEDED 07/04/21   Waddell Danelle ORN, MD  gabapentin  (NEURONTIN ) 100 MG capsule TAKE 1 CAPSULE BY MOUTH THREE TIMES A DAY 04/08/23   Norleen Lynwood ORN, MD  hydrochlorothiazide  (MICROZIDE ) 12.5 MG capsule Take 1 capsule (12.5 mg total) by mouth daily. 04/08/23   Norleen Lynwood ORN, MD  HYDROcodone -acetaminophen  (NORCO/VICODIN) 5-325 MG tablet Take 1-2 tablets by mouth every 6 (six) hours as needed for moderate pain (pain score 4-6) or severe pain (pain score 7-10) (post op pain). 09/30/23   Nida, Andrew, PA-C  hydrocortisone  (ANUSOL -HC) 2.5 % rectal cream Place 1 Application rectally at bedtime. Patient not taking: Reported on  09/16/2023 08/11/23   Abran Norleen SAILOR, MD  lovastatin  (MEVACOR ) 40 MG tablet Take 1 tablet (40 mg total) by mouth daily. 04/08/23   Norleen Lynwood ORN, MD  metoprolol  succinate (TOPROL -XL) 50 MG 24 hr tablet TAKE 1 TABLETS once DAILY BY MOUTH 04/08/23   Norleen Lynwood ORN, MD  Multiple Vitamin (MULTIVITAMIN WITH MINERALS) TABS tablet Take 1 tablet by mouth daily.     [provider]  NON FORMULARY Pt uses cpap nightly    [provider]  olmesartan  (BENICAR ) 40 MG tablet Take 1 tablet (40 mg total) by mouth daily. 04/08/23   Norleen Lynwood ORN, MD  spironolactone  (ALDACTONE ) 25 MG tablet TAKE 0.5 TABLETS BY MOUTH AT BEDTIME. 04/08/23   Norleen Lynwood ORN, MD  temazepam  (RESTORIL ) 30 MG capsule TAKE 1 CAPSULE (30 MG TOTAL) BY MOUTH  AT BEDTIME AS NEEDED. FOR SLEEP Patient taking differently: Take 30 mg by mouth at bedtime. 06/01/23   Norleen Lynwood ORN, MD  tiZANidine  (ZANAFLEX ) 4 MG tablet Take 1 tablet (4 mg total) by mouth every 6 (six) hours as needed for muscle spasms. 09/30/23 09/29/24  Lenis Barter, PA-C    Allergies: Zolpidem tartrate    Review of Systems  Musculoskeletal:        Right hip pain  All other systems reviewed and are negative.   Updated Vital Signs BP (!) 146/71   Pulse 100   Temp 97.8 F (36.6 C) (Oral)   Resp 16   SpO2 100%   Physical Exam Vitals and nursing note reviewed.  Constitutional:      Appearance: Normal appearance.  HENT:     Head: Normocephalic and atraumatic.     Right Ear: External ear normal.     Left Ear: External ear normal.     Nose: Nose normal.     Mouth/Throat:     Mouth: Mucous membranes are moist.     Pharynx: Oropharynx is clear.  Eyes:     Extraocular Movements: Extraocular movements intact.     Conjunctiva/sclera: Conjunctivae normal.     Pupils: Pupils are equal, round, and reactive to light.  Cardiovascular:     Rate and Rhythm: Normal rate and regular rhythm.     Pulses: Normal pulses.     Heart sounds: Normal heart sounds.  Pulmonary:     Effort: Pulmonary effort is normal.     Breath sounds: Normal breath sounds.  Abdominal:     General: Abdomen is flat. Bowel sounds are normal.     Palpations: Abdomen is soft.  Musculoskeletal:     Cervical back: Normal range of motion and neck supple.       Legs:     Comments: Mild swelling RUE.  No signs of redness.    Skin:    General: Skin is warm.     Capillary Refill: Capillary refill takes less than 2 seconds.  Neurological:     General: No focal deficit present.     Mental Status: He is alert and oriented to person, place, and time.  Psychiatric:        Mood and Affect: Mood normal.        Behavior: Behavior normal.     (all labs ordered are listed, but only abnormal results are displayed) Labs  Reviewed - No data to display  EKG: None  Radiology: DG Hip Unilat W or Wo Pelvis 2-3 Views Right Result Date: 09/30/2023 CLINICAL DATA:  Right hip pain after THA yesterday EXAM: DG HIP (WITH OR  WITHOUT PELVIS) 2-3V RIGHT COMPARISON:  None Available. FINDINGS: Right THA. No evidence of loosening. No acute fracture or dislocation. Mild degenerative arthritis left hip. IMPRESSION: Right THA. No evidence of loosening. No acute fracture or dislocation. Electronically Signed   By: Norman Gatlin M.D.   On: 09/30/2023 19:45   DG HIP UNILAT WITH PELVIS 1V RIGHT Result Date: 09/29/2023 CLINICAL DATA:  Elective surgery. EXAM: DG HIP (WITH OR WITHOUT PELVIS) 1V RIGHT COMPARISON:  None Available. FINDINGS: Two fluoroscopic spot views of the pelvis and right hip obtained in the operating room. Images during hip arthroplasty. Fluoroscopy time 24 seconds. Dose 4.5755 mGy. IMPRESSION: Intraoperative fluoroscopy during right hip arthroplasty. Electronically Signed   By: Andrea Gasman M.D.   On: 09/29/2023 13:12   DG C-Arm 1-60 Min-No Report Result Date: 09/29/2023 Fluoroscopy was utilized by the requesting physician.  No radiographic interpretation.   DG C-Arm 1-60 Min-No Report Result Date: 09/29/2023 Fluoroscopy was utilized by the requesting physician.  No radiographic interpretation.     Procedures   Medications Ordered in the ED  morphine  (PF) 4 MG/ML injection 4 mg (4 mg Intravenous Given 09/30/23 1836)  ondansetron  (ZOFRAN ) injection 4 mg (4 mg Intravenous Given 09/30/23 1835)                                    Medical Decision Making Amount and/or Complexity of Data Reviewed Radiology: ordered.  Risk Prescription drug management.   This patient presents to the ED for concern of right hip pain, this involves an extensive number of treatment options, and is a complaint that carries with it a high risk of complications and morbidity.  The differential diagnosis includes fx, dislocation,  post-op pain   Co morbidities that complicate the patient evaluation  hld, anxiety, htn, afib, CAD s/p CABG, CHF, COPD, and CKD   Additional history obtained:  Additional history obtained from epic chart review External records from outside source obtained and reviewed including EMS report  Imaging Studies ordered:  I ordered imaging studies including  r hip  I independently visualized and interpreted imaging which showed  Right THA. No evidence of loosening. No acute fracture or  dislocation.   I agree with the radiologist interpretation   Medicines ordered and prescription drug management:  I ordered medication including morphine /zofran   for sx  Reevaluation of the patient after these medicines showed that the patient improved I have reviewed the patients home medicines and have made adjustments as needed   Test Considered:  us    Consultations Obtained:  I requested consultation with the orthopedist (Dr. Sherida on call for Dr. Dalldorf),  and discussed lab and imaging findings as well as pertinent plan - he will admit if needed.   Problem List / ED Course:  R hip pain:  pt is feeling better after meds.  He has pain meds at home.  He was off Eliquis  for a few days before surgery, so I have ordered DVT US  for tomorrow.  He is on his Eliquis  now, so he does not need a prophylactic dose.  Swelling is likely small hematoma from being up and about today.  He is offered admission, but wants to go home to his dogs.  He knows to call Dr. Delynn office if sx worsen.   Reevaluation:  After the interventions noted above, I reevaluated the patient and found that they have :improved   Social Determinants of Health:  Lives alone   Dispostion:  After consideration of the diagnostic results and the patients response to treatment, I feel that the patent would benefit from discharge with outpatient f/u.       Final diagnoses:  Right hip pain    ED Discharge Orders           Ordered    LE VENOUS        09/30/23 2020               Dean Clarity, MD 09/30/23 2031

## 2023-09-30 NOTE — Progress Notes (Signed)
 AVS has been given to patient and patient taken down to ride

## 2023-09-30 NOTE — Progress Notes (Signed)
 Physical Therapy Treatment Patient Details Name: Scott [name] MRN: [id number] DOB: 1955-12-02 Today's Date: 09/30/2023   History of Present Illness 68 yo male presents to therapy s/p R THA, anterior approach on 09/29/2023 due to failure of conservative measures. Pt PMH includes but is not limited to: LBP, GI bleed, bradycardia, CKD, OA, R TKA (2021), angina, cervical pain stenosis with myelopathy s/p ACDF, falls, s/p CABG, CAD, PAF, s and dCHF, COPD, OSA, anxiety, HTN, HLD, anxiety, CKD, DOE, and loop recorder implant.    PT Comments  POD # 1 am session General Gait Details: found Pt amb self out of bathroom with walker.  Also assisted with amb in hallway to the stairs.  Present with a good alternating gait and proper walker use.  I had my knee done before so using a walker is not new to me, stated Pt. General stair comments: Used Both hands on R rail with 25% VC's on proper sequencing.  Pt was able to safely perform twice. Then returned to room to perform some TE's following HEP handout.  Instructed on proper tech, freq as well as use of ICE.   Addressed all mobility questions, discussed appropriate activity, educated on use of ICE.  Pt ready for D/C to home.    If plan is discharge home, recommend the following: A little help with walking and/or transfers;A little help with bathing/dressing/bathroom;Assistance with cooking/housework;Assist for transportation;Help with stairs or ramp for entrance   Can travel by private vehicle        Equipment Recommendations  None recommended by PT    Recommendations for Other Services       Precautions / Restrictions Precautions Precautions: Fall Restrictions Weight Bearing Restrictions Per Provider Order: No RLE Weight Bearing Per Provider Order: Weight bearing as tolerated     Mobility  Bed Mobility               General bed mobility comments: Mod Indep    Transfers Overall transfer level: Modified independent                  General transfer comment: good safety awareness and use of hands to steady self.  Good walker use.    Ambulation/Gait Ambulation/Gait assistance: Modified independent (Device/Increase time), Supervision Gait Distance (Feet): 45 Feet   Gait Pattern/deviations: Step-through pattern Gait velocity: WFL     General Gait Details: found Pt amb self out of bathroom with walker.  Also assisted with amb in hallway to the stairs.  Present with a good alternating gait and proper walker use.  I had my knee done before so using a walker is not new to me, stated Pt.   Stairs Stairs: Yes Stairs assistance: Modified independent (Device/Increase time), Supervision Stair Management: One rail Right, Step to pattern, Forwards Number of Stairs: 4 General stair comments: Used Both hands on R rail with 25% VC's on proper sequencing.  Pt was able to safely perform twice.   Wheelchair Mobility     Tilt Bed    Modified Rankin (Stroke Patients Only)       Balance                                            Communication Communication Communication: No apparent difficulties  Cognition Arousal: Alert Behavior During Therapy: WFL for tasks assessed/performed   PT - Cognitive impairments: No apparent impairments  Following commands: Intact      Cueing    Exercises  Total Hip Replacement TE's following HEP Handout 10 reps ankle pumps 05 reps knee presses 05 reps heel slides 05 reps SAQ's 05 reps ABD Instructed how to use a belt loop to assist  Followed by ICE     General Comments        Pertinent Vitals/Pain Pain Assessment Pain Assessment: 0-10 Pain Score: 6  Pain Location: R hip with activity Pain Descriptors / Indicators: Aching, Constant, Discomfort, Grimacing, Operative site guarding Pain Intervention(s): Premedicated before session, Repositioned, Ice applied    Home Living                           Prior Function            PT Goals (current goals can now be found in the care plan section) Progress towards PT goals: Progressing toward goals    Frequency    7X/week      PT Plan      Co-evaluation              AM-PAC PT 6 Clicks Mobility   Outcome Measure  Help needed turning from your back to your side while in a flat bed without using bedrails?: None Help needed moving from lying on your back to sitting on the side of a flat bed without using bedrails?: None Help needed moving to and from a bed to a chair (including a wheelchair)?: None Help needed standing up from a chair using your arms (e.g., wheelchair or bedside chair)?: None Help needed to walk in hospital room?: None Help needed climbing 3-5 steps with a railing? : A Little 6 Click Score: 23    End of Session Equipment Utilized During Treatment: Gait belt Activity Tolerance: Patient tolerated treatment well Patient left: in chair;with call bell/phone within reach Nurse Communication: Mobility status (ready to D/C after ONE PT session) PT Visit Diagnosis: Unsteadiness on feet (R26.81);Muscle weakness (generalized) (M62.81);Difficulty in walking, not elsewhere classified (R26.2);Pain Pain - Right/Left: Right Pain - part of body: Leg;Hip     Time: 8886-8855 PT Time Calculation (min) (ACUTE ONLY): 31 min  Charges:    $Gait Training: 8-22 mins $Therapeutic Exercise: 8-22 mins PT General Charges $$ ACUTE PT VISIT: 1 Visit                     {Lastacia Solum  PTA Acute  Rehabilitation Services Office M-F          [phone]

## 2023-09-30 NOTE — ED Triage Notes (Signed)
 Patient had a procedure on his right hip today. While at home, his pain was unmanageable with pain medication. He lives alone and was not able to get up to care for himself.   EMS vitals: 124/68 BP 96% SPO2 on room air 88 HR

## 2023-09-30 NOTE — Plan of Care (Signed)

## 2023-09-30 NOTE — Progress Notes (Signed)
 Subjective: 1 Day Post-Op Procedure(s) (LRB): ARTHROPLASTY, HIP, TOTAL, ANTERIOR APPROACH (Right)  Patient doing well. He is hoping to go home this morning.  Activity level:  wbat Diet tolerance:  ok Voiding:  ok Patient reports pain as mild.    Objective: Vital signs in last 24 hours: Temp:  [97.7 F (36.5 C)-98.4 F (36.9 C)] 97.7 F (36.5 C) (08/06 0558) Pulse Rate:  [80-97] 97 (08/06 0558) Resp:  [7-18] 18 (08/06 0558) BP: (109-141)/(54-72) 141/71 (08/06 0558) SpO2:  [92 %-100 %] 100 % (08/06 0738)  Labs: Recent Labs    09/30/23 0350  HGB 10.2*   Recent Labs    09/30/23 0350  WBC 11.0*  RBC 3.30*  HCT 32.1*  PLT 164   Recent Labs    09/30/23 0350  NA 136  K 4.5  CL 103  CO2 22  BUN 31*  CREATININE 1.71*  GLUCOSE 146*  CALCIUM  8.2*   No results for input(s): LABPT, INR in the last 72 hours.  Physical Exam:  Neurologically intact ABD soft Neurovascular intact Sensation intact distally Intact pulses distally Dorsiflexion/Plantar flexion intact Incision: dressing C/D/I and no drainage No cellulitis present Compartment soft  Assessment/Plan:  1 Day Post-Op Procedure(s) (LRB): ARTHROPLASTY, HIP, TOTAL, ANTERIOR APPROACH (Right) Advance diet Up with therapy D/c home today if cleared by PT and doing well.  Follow up in office 2 weeks post op. Continue on home eliquis .    Prentice Mt Roschelle Calandra 09/30/2023, 8:15 AM

## 2023-09-30 NOTE — Care Management Obs Status (Signed)
 MEDICARE OBSERVATION STATUS NOTIFICATION   Patient Details  Name: Scott [name] MRN: [id number] Date of Birth: [birth year]   Medicare Observation Status Notification Given:  Yes    Alfonse JONELLE Rex, RN 09/30/2023, 10:05 AM

## 2023-10-01 ENCOUNTER — Emergency Department (HOSPITAL_COMMUNITY)
Admission: EM | Admit: 2023-10-01 | Discharge: 2023-10-01 | Disposition: A | Discharge status: Home / Self Care | Attending: Emergency Medicine | Admitting: Emergency Medicine

## 2023-10-01 ENCOUNTER — Emergency Department (EMERGENCY_DEPARTMENT_HOSPITAL)

## 2023-10-01 ENCOUNTER — Encounter (HOSPITAL_COMMUNITY): Payer: Self-pay

## 2023-10-01 DIAGNOSIS — Z7901 Long term (current) use of anticoagulants: Secondary | ICD-10-CM | POA: Diagnosis not present

## 2023-10-01 DIAGNOSIS — M7989 Other specified soft tissue disorders: Secondary | ICD-10-CM | POA: Diagnosis not present

## 2023-10-01 DIAGNOSIS — M25559 Pain in unspecified hip: Secondary | ICD-10-CM | POA: Diagnosis not present

## 2023-10-01 DIAGNOSIS — M25551 Pain in right hip: Secondary | ICD-10-CM | POA: Insufficient documentation

## 2023-10-01 DIAGNOSIS — G8918 Other acute postprocedural pain: Secondary | ICD-10-CM | POA: Insufficient documentation

## 2023-10-01 MED ORDER — HYDROCODONE-ACETAMINOPHEN 5-325 MG PO TABS
2.0000 | ORAL_TABLET | Freq: Once | ORAL | Status: AC
  Administered 2023-10-01: 2 via ORAL
  Filled 2023-10-01: qty 2

## 2023-10-01 NOTE — Discharge Instructions (Signed)
 1.  Continue all of the postoperative care recommended after your surgery. 2.  A prescription was written for you postoperatively for Vicodin. It is at the [facility] pharmacy on [address].  You got good pain control with taking 2 of these tablets in the emergency department.  Continue to take these at home as needed and continue all of the previously provided instructions for managing your hip and pain control.  Your discharge instructions that I reviewed in the chart specifically said not to take your tramadol  while you are using Vicodin. 3.  Call your surgeon to let them know you are seen in the emergency department and schedule follow-up as recommended. 4.  You had an ultrasound done in the emergency department.  It did not show any blood clot in the major vessels of the leg.

## 2023-10-01 NOTE — Progress Notes (Signed)
 BLE venous duplex has been completed.  Preliminary results given to Dr. Elbridge.   Results can be found under chart review under CV PROC. 10/01/2023 2:07 PM Annel Zunker RVT, RDMS

## 2023-10-01 NOTE — ED Triage Notes (Signed)
 Pt BIBA from home for uncontrolled right hip pain from procedure yesterday morning.  Pt was brought to ER for same problem yesterday post procedure.  Pt unable to ambulate or provide self care at home.

## 2023-10-01 NOTE — Anesthesia Postprocedure Evaluation (Signed)
 Anesthesia Post Note  Patient: Scott [name]  Procedure(s) Performed: ARTHROPLASTY, HIP, TOTAL, ANTERIOR APPROACH (Right: Hip)     Patient location during evaluation: PACU Anesthesia Type: General Level of consciousness: sedated and patient cooperative Pain management: pain level controlled Vital Signs Assessment: post-procedure vital signs reviewed and stable Respiratory status: spontaneous breathing Cardiovascular status: stable Anesthetic complications: no   No notable events documented.  Last Vitals:  Vitals:   09/30/23 0738 09/30/23 1012  BP:  114/65  Pulse:  86  Resp:  16  Temp:  36.8 C  SpO2: 100% 100%    Last Pain:  Vitals:   09/30/23 1012  TempSrc: Oral  PainSc:                  [name]

## 2023-10-01 NOTE — ED Provider Notes (Signed)
 [place] EMERGENCY DEPARTMENT AT [facility] Provider Note   CSN: [id number] Arrival date & time: 10/01/23  9176     Patient presents with: No chief complaint on file.   Scott [name] is a 68 y.o. male.   HPI Patient was seen yesterday for postoperative hip pain.  He is postop day 2 after right hip replacement.  Patient is anticoagulated on Eliquis .  He reports that the pain is uncontrolled overnight.  Patient could not recall very clearly what pain medications he had taken.  Ultimately after we sat down and I reviewed the chart and discussed it with him, patient took tramadol  which he is chronically prescribed for daily use for pain medications.  He has not filled the Vicodin that was prescribed postoperatively.  Patient reports a lot of deep aching pain from the hip and into the anterior thigh.  He denies other symptoms.  Patient has resumed taking his Eliquis .    Prior to Admission medications   Medication Sig Start Date End Date Taking? Authorizing Provider  acetaminophen  (TYLENOL ) 500 MG tablet Take 1,000 mg by mouth every 6 (six) hours as needed for mild pain.     [provider]  albuterol  (VENTOLIN  HFA) 108 (90 Base) MCG/ACT inhaler TAKE 2 PUFFS BY MOUTH EVERY 6 HOURS AS NEEDED FOR WHEEZE OR SHORTNESS OF BREATH 08/31/23   Norleen Lynwood ORN, MD  allopurinol  (ZYLOPRIM ) 100 MG tablet Take 1 tablet (100 mg total) by mouth daily. To lower uric acid and prevent gout 04/08/23   Norleen Lynwood ORN, MD  ALPRAZolam  (XANAX ) 0.5 MG tablet TAKE 1 TABLET BY MOUTH TWICE A DAY AS NEEDED FOR ANXIETY 06/01/23   Norleen Lynwood ORN, MD  amLODipine  (NORVASC ) 5 MG tablet Take 1 tablet (5 mg total) by mouth daily. 04/08/23 04/07/24  Norleen Lynwood ORN, MD  cholecalciferol  (VITAMIN D ) 1000 units tablet Take 1,000 Units by mouth daily.    [provider]  clotrimazole -betamethasone  (LOTRISONE ) cream Apply 1 Application topically daily. Patient not taking: Reported on 09/16/2023 11/14/21   Norleen Lynwood ORN, MD  co-enzyme Q-10 30 MG capsule Take 30 mg by mouth daily.    [provider]  colchicine  0.6 MG tablet Take 1 tablet (0.6 mg total) by mouth daily as needed (gout pain). 04/08/23   Norleen Lynwood ORN, MD  cyclobenzaprine  (FLEXERIL ) 5 MG tablet Take 1 tablet (5 mg total) by mouth 3 (three) times daily as needed for muscle spasms. Patient not taking: Reported on 09/16/2023 01/01/21   Norleen Lynwood ORN, MD  docusate sodium  (COLACE) 100 MG capsule Take 200 mg by mouth daily as needed for mild constipation.     [provider]  donepezil  (ARICEPT ) 5 MG tablet Take 1 tablet (5 mg total) by mouth at bedtime. 04/08/23   Norleen Lynwood ORN, MD  ELIQUIS  5 MG TABS tablet TAKE 1 TABLET BY MOUTH TWICE A DAY 10/21/22   Norleen Lynwood ORN, MD  Fluticasone -Umeclidin-Vilant (TRELEGY ELLIPTA ) 100-62.5-25 MCG/ACT AEPB Take 1 inhalation once daily 04/08/23   Norleen Lynwood ORN, MD  furosemide  (LASIX ) 20 MG tablet TAKE 1 TABLET BY MOUTH EVERY DAY AS NEEDED 07/04/21   Waddell Danelle ORN, MD  gabapentin  (NEURONTIN ) 100 MG capsule TAKE 1 CAPSULE BY MOUTH THREE TIMES A DAY 04/08/23   Norleen Lynwood ORN, MD  hydrochlorothiazide  (MICROZIDE ) 12.5 MG capsule Take 1 capsule (12.5 mg total) by mouth daily. 04/08/23   Norleen Lynwood ORN, MD  HYDROcodone -acetaminophen  (NORCO/VICODIN) 5-325 MG tablet Take 1-2 tablets  by mouth every 6 (six) hours as needed for moderate pain (pain score 4-6) or severe pain (pain score 7-10) (post op pain). 09/30/23   Lenis Barter, PA-C  hydrocortisone  (ANUSOL -HC) 2.5 % rectal cream Place 1 Application rectally at bedtime. Patient not taking: Reported on 09/16/2023 08/11/23   Abran Norleen SAILOR, MD  lovastatin  (MEVACOR ) 40 MG tablet Take 1 tablet (40 mg total) by mouth daily. 04/08/23   Norleen Lynwood ORN, MD  metoprolol  succinate (TOPROL -XL) 50 MG 24 hr tablet TAKE 1 TABLETS once DAILY BY MOUTH 04/08/23   Norleen Lynwood ORN, MD  Multiple Vitamin (MULTIVITAMIN WITH MINERALS) TABS tablet Take 1 tablet by mouth daily.     [provider]   NON FORMULARY Pt uses cpap nightly    [provider]  olmesartan  (BENICAR ) 40 MG tablet Take 1 tablet (40 mg total) by mouth daily. 04/08/23   Norleen Lynwood ORN, MD  spironolactone  (ALDACTONE ) 25 MG tablet TAKE 0.5 TABLETS BY MOUTH AT BEDTIME. 04/08/23   Norleen Lynwood ORN, MD  temazepam  (RESTORIL ) 30 MG capsule TAKE 1 CAPSULE (30 MG TOTAL) BY MOUTH AT BEDTIME AS NEEDED. FOR SLEEP Patient taking differently: Take 30 mg by mouth at bedtime. 06/01/23   Norleen Lynwood ORN, MD  tiZANidine  (ZANAFLEX ) 4 MG tablet Take 1 tablet (4 mg total) by mouth every 6 (six) hours as needed for muscle spasms. 09/30/23 09/29/24  Lenis Barter, PA-C    Allergies: Zolpidem tartrate    Review of Systems  Updated Vital Signs BP (!) 115/55   Pulse 90   Temp 98.2 F (36.8 C) (Oral)   Resp 18   SpO2 96%   Physical Exam Constitutional:      Comments: Alert nontoxic clear mental status.  No respiratory distress.  HENT:     Mouth/Throat:     Pharynx: Oropharynx is clear.  Eyes:     Extraocular Movements: Extraocular movements intact.  Cardiovascular:     Rate and Rhythm: Normal rate and regular rhythm.  Pulmonary:     Effort: Pulmonary effort is normal.     Breath sounds: Normal breath sounds.  Abdominal:     General: There is no distension.     Palpations: Abdomen is soft.     Tenderness: There is no abdominal tenderness.  Musculoskeletal:     Comments: Diffuse ecchymosis of the right upper thigh.  Surgical wound healing well.  There is 1 area with small amount of exudative material at the cephalic end of the wound.  Moderate diffuse swelling of the right lower extremity.  This includes the calf and the foot.  Calf is soft and nontender.  Foot is warm and dry.  Skin:    General: Skin is warm and dry.  Neurological:     General: No focal deficit present.     Mental Status: He is oriented to person, place, and time.     Coordination: Coordination normal.     (all labs ordered are listed, but only abnormal  results are displayed) Labs Reviewed - No data to display  EKG: None  Radiology: DG Hip Unilat W or Wo Pelvis 2-3 Views Right Result Date: 09/30/2023 CLINICAL DATA:  Right hip pain after THA yesterday EXAM: DG HIP (WITH OR WITHOUT PELVIS) 2-3V RIGHT COMPARISON:  None Available. FINDINGS: Right THA. No evidence of loosening. No acute fracture or dislocation. Mild degenerative arthritis left hip. IMPRESSION: Right THA. No evidence of loosening. No acute fracture or dislocation. Electronically Signed   By: Norman Gatlin  M.D.   On: 09/30/2023 19:45     Procedures   Medications Ordered in the ED  HYDROcodone -acetaminophen  (NORCO/VICODIN) 5-325 MG per tablet 2 tablet (2 tablets Oral Given 10/01/23 0911)                                    Medical Decision Making Risk Prescription drug management.   Patient presents as outlined.  He is postoperative day 2.  He was seen yesterday after discharge from the hospital due to uncontrolled pain.  Patient has not filled his prescribed Vicodin for home use.  At this time we will administer dose of Vicodin and proceed with lower extremity ultrasound for diffuse swelling.  DVT study negative for DVT.  12: 00 recheck: Patient reports pain is much better.  Patient has had good response to 2 Vicodin tablets which would have been the pain control prescribed postoperatively.  Patient had not yet filled this and has not been appropriately medicated for his postoperative pain.  Given that he has had good response I feel he is stable for discharge and continuing his outpatient recommendations for postoperative care.  Patient is counseled to pick up his prescription and contact his orthopedic doctor to schedule follow-up.     Final diagnoses:  Acute postoperative pain of right hip    ED Discharge Orders     None          Armenta Canning, MD 10/01/23 1214

## 2023-10-01 NOTE — ED Notes (Signed)
 Writer left voicemail with vascular tech to check on eta.

## 2023-10-02 DIAGNOSIS — Z96641 Presence of right artificial hip joint: Secondary | ICD-10-CM | POA: Diagnosis not present

## 2023-10-02 DIAGNOSIS — M6281 Muscle weakness (generalized): Secondary | ICD-10-CM | POA: Diagnosis not present

## 2023-10-02 DIAGNOSIS — M25651 Stiffness of right hip, not elsewhere classified: Secondary | ICD-10-CM | POA: Diagnosis not present

## 2023-10-05 ENCOUNTER — Encounter: Payer: Self-pay | Admitting: Internal Medicine

## 2023-10-06 ENCOUNTER — Other Ambulatory Visit: Payer: Self-pay | Admitting: Internal Medicine

## 2023-10-06 ENCOUNTER — Encounter: Payer: Self-pay | Admitting: Psychology

## 2023-10-06 DIAGNOSIS — M6281 Muscle weakness (generalized): Secondary | ICD-10-CM | POA: Diagnosis not present

## 2023-10-06 DIAGNOSIS — M25651 Stiffness of right hip, not elsewhere classified: Secondary | ICD-10-CM | POA: Diagnosis not present

## 2023-10-06 DIAGNOSIS — Z96641 Presence of right artificial hip joint: Secondary | ICD-10-CM | POA: Diagnosis not present

## 2023-10-08 DIAGNOSIS — Z96641 Presence of right artificial hip joint: Secondary | ICD-10-CM | POA: Diagnosis not present

## 2023-10-08 DIAGNOSIS — M25651 Stiffness of right hip, not elsewhere classified: Secondary | ICD-10-CM | POA: Diagnosis not present

## 2023-10-08 DIAGNOSIS — M6281 Muscle weakness (generalized): Secondary | ICD-10-CM | POA: Diagnosis not present

## 2023-10-09 DIAGNOSIS — M25651 Stiffness of right hip, not elsewhere classified: Secondary | ICD-10-CM | POA: Diagnosis not present

## 2023-10-10 ENCOUNTER — Other Ambulatory Visit: Payer: Self-pay | Admitting: Internal Medicine

## 2023-10-13 DIAGNOSIS — M6281 Muscle weakness (generalized): Secondary | ICD-10-CM | POA: Diagnosis not present

## 2023-10-13 DIAGNOSIS — M25651 Stiffness of right hip, not elsewhere classified: Secondary | ICD-10-CM | POA: Diagnosis not present

## 2023-10-13 DIAGNOSIS — Z7409 Other reduced mobility: Secondary | ICD-10-CM | POA: Diagnosis not present

## 2023-10-13 DIAGNOSIS — Z96641 Presence of right artificial hip joint: Secondary | ICD-10-CM | POA: Diagnosis not present

## 2023-10-15 DIAGNOSIS — M25651 Stiffness of right hip, not elsewhere classified: Secondary | ICD-10-CM | POA: Diagnosis not present

## 2023-10-15 DIAGNOSIS — Z96641 Presence of right artificial hip joint: Secondary | ICD-10-CM | POA: Diagnosis not present

## 2023-10-15 DIAGNOSIS — Z7409 Other reduced mobility: Secondary | ICD-10-CM | POA: Diagnosis not present

## 2023-10-15 DIAGNOSIS — M6281 Muscle weakness (generalized): Secondary | ICD-10-CM | POA: Diagnosis not present

## 2023-10-20 ENCOUNTER — Encounter: Admitting: Internal Medicine

## 2023-10-20 DIAGNOSIS — M6281 Muscle weakness (generalized): Secondary | ICD-10-CM | POA: Diagnosis not present

## 2023-10-20 DIAGNOSIS — M25651 Stiffness of right hip, not elsewhere classified: Secondary | ICD-10-CM | POA: Diagnosis not present

## 2023-10-20 DIAGNOSIS — Z7409 Other reduced mobility: Secondary | ICD-10-CM | POA: Diagnosis not present

## 2023-10-20 DIAGNOSIS — Z96641 Presence of right artificial hip joint: Secondary | ICD-10-CM | POA: Diagnosis not present

## 2023-10-22 DIAGNOSIS — M25651 Stiffness of right hip, not elsewhere classified: Secondary | ICD-10-CM | POA: Diagnosis not present

## 2023-10-22 DIAGNOSIS — Z7409 Other reduced mobility: Secondary | ICD-10-CM | POA: Diagnosis not present

## 2023-10-22 DIAGNOSIS — M6281 Muscle weakness (generalized): Secondary | ICD-10-CM | POA: Diagnosis not present

## 2023-10-22 DIAGNOSIS — Z96641 Presence of right artificial hip joint: Secondary | ICD-10-CM | POA: Diagnosis not present

## 2023-10-27 DIAGNOSIS — Z96641 Presence of right artificial hip joint: Secondary | ICD-10-CM | POA: Diagnosis not present

## 2023-10-27 DIAGNOSIS — M6281 Muscle weakness (generalized): Secondary | ICD-10-CM | POA: Diagnosis not present

## 2023-10-27 DIAGNOSIS — M25651 Stiffness of right hip, not elsewhere classified: Secondary | ICD-10-CM | POA: Diagnosis not present

## 2023-10-29 DIAGNOSIS — M6281 Muscle weakness (generalized): Secondary | ICD-10-CM | POA: Diagnosis not present

## 2023-10-29 DIAGNOSIS — M25651 Stiffness of right hip, not elsewhere classified: Secondary | ICD-10-CM | POA: Diagnosis not present

## 2023-10-29 DIAGNOSIS — Z96641 Presence of right artificial hip joint: Secondary | ICD-10-CM | POA: Diagnosis not present

## 2023-11-03 DIAGNOSIS — M6281 Muscle weakness (generalized): Secondary | ICD-10-CM | POA: Diagnosis not present

## 2023-11-03 DIAGNOSIS — Z96641 Presence of right artificial hip joint: Secondary | ICD-10-CM | POA: Diagnosis not present

## 2023-11-03 DIAGNOSIS — M25561 Pain in right knee: Secondary | ICD-10-CM | POA: Diagnosis not present

## 2023-11-05 DIAGNOSIS — Z96641 Presence of right artificial hip joint: Secondary | ICD-10-CM | POA: Diagnosis not present

## 2023-11-05 DIAGNOSIS — M25551 Pain in right hip: Secondary | ICD-10-CM | POA: Diagnosis not present

## 2023-11-05 DIAGNOSIS — M25651 Stiffness of right hip, not elsewhere classified: Secondary | ICD-10-CM | POA: Diagnosis not present

## 2023-11-05 DIAGNOSIS — M6281 Muscle weakness (generalized): Secondary | ICD-10-CM | POA: Diagnosis not present

## 2023-11-10 DIAGNOSIS — M6281 Muscle weakness (generalized): Secondary | ICD-10-CM | POA: Diagnosis not present

## 2023-11-10 DIAGNOSIS — Z96641 Presence of right artificial hip joint: Secondary | ICD-10-CM | POA: Diagnosis not present

## 2023-11-10 DIAGNOSIS — M25651 Stiffness of right hip, not elsewhere classified: Secondary | ICD-10-CM | POA: Diagnosis not present

## 2023-11-12 DIAGNOSIS — M25651 Stiffness of right hip, not elsewhere classified: Secondary | ICD-10-CM | POA: Diagnosis not present

## 2023-11-12 DIAGNOSIS — M6281 Muscle weakness (generalized): Secondary | ICD-10-CM | POA: Diagnosis not present

## 2023-11-12 DIAGNOSIS — Z96641 Presence of right artificial hip joint: Secondary | ICD-10-CM | POA: Diagnosis not present

## 2023-11-17 ENCOUNTER — Ambulatory Visit
Admission: RE | Admit: 2023-11-17 | Discharge: 2023-11-17 | Disposition: A | Discharge status: Home / Self Care | Source: Ambulatory Visit | Attending: Orthopedic Surgery | Admitting: Orthopedic Surgery

## 2023-11-17 ENCOUNTER — Other Ambulatory Visit: Payer: Self-pay | Admitting: Orthopedic Surgery

## 2023-11-17 DIAGNOSIS — M25551 Pain in right hip: Secondary | ICD-10-CM

## 2023-11-17 DIAGNOSIS — M6281 Muscle weakness (generalized): Secondary | ICD-10-CM | POA: Diagnosis not present

## 2023-11-17 DIAGNOSIS — Z96641 Presence of right artificial hip joint: Secondary | ICD-10-CM | POA: Diagnosis not present

## 2023-11-17 DIAGNOSIS — M25651 Stiffness of right hip, not elsewhere classified: Secondary | ICD-10-CM | POA: Diagnosis not present

## 2023-11-20 DIAGNOSIS — M25551 Pain in right hip: Secondary | ICD-10-CM | POA: Diagnosis not present

## 2023-11-25 ENCOUNTER — Encounter: Attending: Psychology | Admitting: Psychology

## 2023-11-25 DIAGNOSIS — R413 Other amnesia: Secondary | ICD-10-CM | POA: Diagnosis not present

## 2023-11-25 NOTE — Progress Notes (Signed)
 NEUROPSYCHOLOGICAL EVALUATION [place]  Physical Medicine and Rehabilitation     Patient: Scott [name]  MRN: [id number] DOB: 1955-11-18  Age: 68 y.o. Sex: male  Race/Ethnicity: White or Caucasian  Years of Education: 15  Referring Provider: Buck Saucer, MD, PhD  Provider/Clinical Neuropsychologist: Evalene DOROTHA Riff, PsyD  Date of Service: 11/25/2023 Start Time: 8:10 AM End Time: 10:10 AM  Location of Service:  [facility] Physical Medicine & Rehabilitation Department [place]. [facility] [address], [place]. [address] [place], [place] [address] Phone: [phone]  Billing Code/Service: 96116/96121  Individuals Present: Patient was seen unaccompanied, in-person, by the provider. 1 hour and 15 minutes spent in face-to-face clinical interview and remaining 45 minutes was spent in record review, documentation, and testing protocol construction.    PATIENT CONSENT AND CONFIDENTIALITY The patient's understanding of the reason for referral was intact. Discussed limits of confidentiality including, but not limited to, posting of final evaluation report in the patient's electronic medical record for both the patient and for the referring provider and appropriate medical professionals. Patient was given the opportunity to have their questions answered. The neuropsychological evaluation process was discussed with the patient and they consented to proceed with the evaluation.  Consent for Evaluation and Treatment: Signed: Yes Explanation of Privacy Policies: Signed: Yes Discussion of Confidentiality Limits: Yes  REASON FOR REFERRAL:The patient was referred for neuropsychological evaluation by his neurologist, Dr. Buck, due to concerns for memory loss.  The patient has a medical history of of hypertension, hyperglycemia, hyperlipidemia, chronic kidney disease, chest pain, vitamin D  deficiency, B12 deficiency, bradycardia, osteoarthritis, allergies,  anxiety, coronary artery disease (s/p CABG 2013), atrial fibrillation with (s/p cardioversion x 4, ablation), on chronic anticoagulation, congestive heart failure, COPD, OSA (CPAP compliant), obesity, s/p multiple surgeries including right total knee replacement in 2021 and subsequent arthroscopic right knee surgery in 2023, A-fib ablation in 2009 in 2011, maze procedure in 2013, loop recorder implantation in 2014, (s/p ACDF in 2018). Records indicate he recently underwent surgery for hip replacement in 09/30/2023. Per neurology visit notes (07/28/23), the patient has family history of dementia in his paternal grandfather (uncertain if it was Alzheimer's). 04/24/23 MRI was reported, per records, to show ... 2. Minimal chronic small vessel ischemic changes within the cerebral white matter. 3. Nonspecific punctate chronic microhemorrhage within left frontal lobe. 4. Mild generalized cerebral atrophy. 11/27/2023 ATN Profile results (A+T+N+), concerning for Alzheimer's related pathology. In addition, the patient has multiple vascular risk factors, and there was some concern for risk of polypharmacy.   The patient presented to the clinical interview unaccompanied. The patient presented for check-in for the evaluation but did not recall the reason for the referral. Admin staff provided a general description about cognitive evaluations. The patient indicated he was not interested in proceeding and began to leave. The provider then spoke to the patient and the patient agreed to allow the provider to provide more details. The provided explained the evaluation process in depth and shared information about the reason for referral based on medical record review. The patient had significant concerns about privacy, which the provider addressed, explaining that information from the evaluation is considered protected health information, and reviewing privacy protections and that information would not be shared with anyone outside of  his medical providers without his consent. The patient was expressed understanding and was amenable to proceeding.   HISTORY OF PRESENTING CONCERNS:  Cognitive Symptom Onset & Course: Information was obtained from the patient via clinical  interview.  The patient indicated that his sister had expressed some concerns about cognitive changes although did not want her involved in the evaluation itself (for collateral interview etc.) and indicated that they are not close and do not see eye-to-eye often.  The patient indicated that he thinks he may have had some cognitive changes over the last 2 years that were gradual in onset.  He indicated that he feels these are stable.  Current Cognitive Complaints:  Memory: The patient decayed that when he does not recall details of conversations it is usually because he is not paying adequate attention.  He denied any significant problems keeping up with his medical appointments or remembering to pay his bills.  He denied any changes with respect to misplacing things.  He denied any problems getting lost in familiar locations. Processing Speed: He offered that there may be slight changes in his thinking speed. Attention & Concentration: He endorsed some minor declines in attention and concentration. Language: He endorsed changes in word finding.  He denied problems in auditory-verbal comprehension.  He denied any changes in his reading abilities.  He indicated mental arithmetic may be slightly more difficult. Visual-Spatial: He denied any problems with visual-spatial abilities and indicated that he works on his motorcycles without issue.  Executive Functioning: He indicated that decision making may be slightly more challenging from time to time.  He denied any significant problems in planning or organization.  He denied any big changes in personality or behavior.  Motor/Sensory Complaints:   Sensory changes: He reported long running issues with sense of smell and  taste (almost life-long).  He indicated that hearing is adequate although he does have tinnitus for as long as I can remember. He wears glasses and can see adequately with them.  Balance/coordination difficulties: No significant changes or problems with balance. Frequent instances of dizziness/vertigo: Some slight dizziness if getting up too quickly. Other motor difficulties: No tremors or other motor functioning complaints.  Emotional and Behavioral Functioning:  The patient denied any significant psychiatric history. Depression Sx: He denied any problems with feeling down or blue frequently.  He denied any anhedonia.  He denied any suicidal ideation. Anxiety Sx: He denied having difficulties with excessive worry, feelings of tension, difficulty relaxing, or other clear indications of anxiety. Other: He denied any current or past suicidal ideation, homicidal ideation, mania, hallucinations, delusions, or inpatient psychiatric admission.  Sleep: He denied any problems with sleep onset or maintenance.  He indicates he typically gets about 6 hours of sleep per night and feels rested in the morning. Appetite: No significant changes in appetite or unintentional weight loss or gain. Caffeine: None. Alcohol Use: None in many years. Tobacco Use: Quit smoking about 20 years ago. Recreational Substance Use: None.  Level of Functional Independence: The patient is intact with basic activities of daily living.  Finances: Denied any problems with remembering to pay bills.  He indicated he writes everything down. Shopping / Meal Preparation: Intact. Household Maintenance / Chores: Intact. Tracking Appointments / Future Obligations: Manages by writing it down and putting on a calendar.  Denied any problems making it to appointments. Medication Management: Utilizes a pill organizer's.  He occasionally forgets morning medications. Driving: Denied any problems.  Medical History/Record Review: Per records  and patient self-report; History of traumatic brain injury/concussion: Remote history of head injury 3-4x. LOC 2x. Denied residual symptoms. History of stroke: None History of heart attack: None, but reported he has heart failure.  History of cancer/chemotherapy: None History  of seizure activity: None Symptoms of chronic pain: Minimal Experience of frequent headaches/migraines: None  Past Medical History:  Diagnosis Date   ALLERGIC RHINITIS 01/14/2007   Allergy    ANXIETY 09/28/2006   Asbestos exposure    Atrial fibrillation (HCC) 09/28/2006   s/p afib ablation x 2   CHF (congestive heart failure) (HCC)    CKD (chronic kidney disease), stage III (HCC)    patient denies   COPD (chronic obstructive pulmonary disease) (HCC)    Coronary artery disease 11/24/2011   Diastolic dysfunction 09/28/2006   Dyspnea    W/ EXERTION    Dysrhythmia    AFIB      First degree AV block 10/12/2018   Noted on EKG   Gallstones    Hyperglycemia    HYPERLIPIDEMIA 09/28/2006   HYPERTENSION 09/28/2006   HYPOTHYROIDISM, ACQUIRED NEC 09/28/2006   pt denies and doesn't know   Long term current use of anticoagulant 04/08/2010   Obese    OBSTRUCTIVE SLEEP APNEA 01/06/2008   NOT USING     Right knee DJD 07/09/2010   S/P CABG x 1 11/26/2011   Right internal mammary artery to right coronary artery   S/P Maze operation for atrial fibrillation 11/26/2011   complete biatrial lesion set with clipping of LA appendage   Patient Active Problem List   Diagnosis Date Noted   Primary localized osteoarthritis of right hip 09/29/2023   Primary osteoarthritis of right hip 09/29/2023   Memory changes 04/08/2023   Bradycardia 10/06/2022   Pain and swelling of right wrist 10/06/2022   Left ankle pain 04/07/2022   Left cataract 12/30/2021   Rash 11/14/2021   Axillary lymphadenopathy 11/14/2021   Low back pain 01/01/2021   Lower GI bleeding 01/01/2021   Pain due to total right knee replacement 01/09/2020    Primary osteoarthritis of right knee 04/05/2019   CKD (chronic kidney disease) stage 3, GFR 30-59 ml/min (HCC) 03/14/2019   Degenerative joint disease of knee, right 09/27/2018   Dyspnea 09/06/2018   Right knee pain 09/06/2018   Radiculitis of left cervical region 03/05/2018   Plantar fasciitis, left 08/19/2017   Chest pain 02/27/2017   Right ear pain 02/27/2017   Left groin pain 09/13/2016   Rib pain on left side 09/13/2016   Left leg pain 09/13/2016   Stenosis of cervical spine with myelopathy (HCC) 06/20/2016   Special screening for malignant neoplasms, colon 04/04/2016   Cough 01/11/2016   COPD exacerbation (HCC) 12/22/2015   Rectal bleeding 08/14/2014   Intercostal muscle strain 08/07/2014   Chronic meniscal tear of knee 04/06/2014   Heel bone fracture 04/06/2014   Hyperglycemia 04/04/2014   Recurrent falls 04/04/2014   Bilateral knee pain 04/04/2014   Intractable left heel pain 04/04/2014   Insomnia 04/04/2014   Hematochezia 04/04/2014   Bladder neck obstruction 12/19/2013   Encounter for therapeutic drug monitoring 03/30/2013   Palpitations 07/06/2012   Shortness of breath 01/19/2012   Sick sinus syndrome (HCC) 12/01/2011   S/P CABG x 1 11/26/2011   S/P Maze operation for atrial fibrillation 11/26/2011   CAD (coronary artery disease) 11/25/2011   Paroxysmal atrial fibrillation (HCC) 11/24/2011   Chronic combined systolic (congestive) and diastolic (congestive) heart failure (HCC) 11/24/2011   Gross hematuria 12/12/2010   Left knee pain 12/12/2010   Left knee DJD 07/09/2010   Encounter for well adult exam with abnormal findings 07/07/2010   Chronic anticoagulation 04/08/2010   COPD (chronic obstructive pulmonary disease) (HCC) 10/04/2008   OBSTRUCTIVE SLEEP  APNEA 01/06/2008   Allergic rhinitis 01/14/2007   Hypothyroidism 09/28/2006   Hyperlipidemia 09/28/2006   Anxiety state 09/28/2006   Essential hypertension 09/28/2006    Imaging/Lab Results: See record  review)  Family Neurologic/Medical Hx:  Family History  Problem Relation Age of Onset   Dementia Father    Hypertension Father    Atrial fibrillation Mother    Stroke Mother    Dementia Paternal Grandfather    Cancer Paternal Grandmother        type unknown   Colon cancer Neg Hx    Esophageal cancer Neg Hx    Prostate cancer Neg Hx    Rectal cancer Neg Hx    Stomach cancer Neg Hx    Medications:  acetaminophen  (TYLENOL ) 500 MG tablet albuterol  (VENTOLIN  HFA) 108 (90 Base) MCG/ACT inhaler allopurinol  (ZYLOPRIM ) 100 MG tablet ALPRAZolam  (XANAX ) 0.5 MG tablet  (Per interview; taken at night for sleep.  Reported limited use most recently) amLODipine  (NORVASC ) 5 MG tablet apixaban  (ELIQUIS ) 5 MG TABS tablet cholecalciferol  (VITAMIN D ) 1000 units tablet clotrimazole -betamethasone  (LOTRISONE ) cream co-enzyme Q-10 30 MG capsule colchicine  0.6 MG tablet cyclobenzaprine  (FLEXERIL ) 5 MG tablet  (Per interview; indicated he rarely takes this) docusate sodium  (COLACE) 100 MG capsule donepezil  (ARICEPT ) 5 MG tablet Fluticasone -Umeclidin-Vilant (TRELEGY ELLIPTA ) 100-62.5-25 MCG/ACT AEPB furosemide  (LASIX ) 20 MG tablet gabapentin  (NEURONTIN ) 100 MG capsule   (Per interview; two times a day) hydrochlorothiazide  (MICROZIDE ) 12.5 MG capsule hydrocortisone  (ANUSOL -HC) 2.5 % rectal cream lovastatin  (MEVACOR ) 40 MG tablet metoprolol  succinate (TOPROL -XL) 50 MG 24 hr tablet Multiple Vitamin (MULTIVITAMIN WITH MINERALS) TABS tablet NON FORMULARY olmesartan  (BENICAR ) 40 MG tablet spironolactone  (ALDACTONE ) 25 MG tablet temazepam  (RESTORIL ) 30 MG capsule tiZANidine  (ZANAFLEX ) 4 MG tablet traMADol  (ULTRAM ) 50 MG tablet  (Per interview; 1 per day on average)  Academic/Vocational History: Graduated high school and completed 3 years of college. Endorsed some mild attentional difficulties growing up. 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 Stopped working and had to go on disability for COPD at  least 10 years ago.  Psychosocial: Marital Status: Single Children/Grandchildren: None Living Situation: Alone with his dog [place])  Mental Status/Behavioral Observations: The patient was seen on an outpatient basis in the [facility] PM[facility] office for the clinical interview unaccompanied. Sensorium/Arousal: Alert. Hearing and vision adequate for the demands of (future) cognitive testing. Orientation: Intact to sense of self and location.  Appearance: Appropriate dress and hygiene.  Behavior: Polite, but suspicious and slightly easily irritated.  Speech/Language: Conversational speech was fluent, well-articulated, and prosodic. Basic auditory-verbal comprehension appeared intact.  Motor: WNL within the context of recent surgery.  Social Comportment: Respectful. Genuine.  Mood: Slightly irritable. Affect: Congruent. Thought Process/Content: No indications of psychosis.  Ability to Participate in Interview: Readily answered all questions. Details could be slightly limited at times.    SUMMARY / CLINICAL IMPRESSIONS The patient was referred for neuropsychological evaluation by his neurologist, Dr. Buck, due to concerns for memory loss.  The patient has a medical history of of hypertension, hyperglycemia, hyperlipidemia, chronic kidney disease, chest pain, vitamin D  deficiency, B12 deficiency, bradycardia, osteoarthritis, allergies, anxiety, coronary artery disease (s/p CABG 2013), atrial fibrillation with (s/p cardioversion x 4, ablation), on chronic anticoagulation, congestive heart failure, COPD, OSA (CPAP compliant), obesity, s/p multiple surgeries including right total knee replacement in 2021 and subsequent arthroscopic right knee surgery in 2023, A-fib ablation in 2009 in 2011, maze procedure in 2013, loop recorder implantation in 2014, (s/p ACDF in 2018). Records indicate he recently underwent surgery for hip replacement in  09/30/2023. Per neurology visit notes (07/28/23), the patient  has family history of dementia in his paternal grandfather (uncertain if it was Alzheimer's). 04/24/23 MRI was reported, per records, to show ... 2. Minimal chronic small vessel ischemic changes within the cerebral white matter. 3. Nonspecific punctate chronic microhemorrhage within left frontal lobe. 4. Mild generalized cerebral atrophy. 11/27/2023 ATN Profile results (A+T+N+), concerning for Alzheimer's related pathology. In addition, the patient has multiple vascular risk factors, and there was some concern for risk of polypharmacy.   The patient presented to the clinical interview unaccompanied. The patient presented for check-in for the evaluation but did not recall the reason for the referral. Admin staff provided a general description about cognitive evaluations. The patient indicated he was not interested in proceeding and began to leave. The provider then spoke to the patient and the patient agreed to allow the provider to provide more details. The provided explained the evaluation process in depth and shared information about the reason for referral based on medical record review. The patient had significant concerns about privacy, which the provider addressed, explaining that information from the evaluation is considered protected health information, and reviewing privacy protections and that information would not be shared with anyone outside of his medical providers without his consent. The patient was expressed understanding and was amenable to proceeding. The patient endorsed only mild cognitive complaints. Reason for proceeding with the evaluation appeared to be primarily external / possibly family related as his sister reportedly had encouraged him. He denied any significant changes in functioning related to cognitive changes. No significant psychiatric history. Formal neuropsychological testing is indicated due to concerns for AD pathology and vascular findings in imaging. Cognitive testing will  provide objective data regarding cognitive functioning relative to others of similar age and support differential diagnosis and treatment planning.   DISPOSITION / PLAN  The patient has been set up for a formal neuropsychological assessment to objectively assess her cognitive functioning across domains to establish the patient's cognitive profile. This data, in conjunction with information obtained via clinical interview and medical record review, will help clarify likely etiology and guide treatment recommendations. Once data collection and interpretation have been completed, the findings / diagnosis and recommendations will be reviewed and discussed with the patient during a feedback appointment with the neuropsychologist. Based on the collaborative dialogue with the patient during the feedback, recommendations may be adjusted / tailored as needed. A formal report will be produced and provided to the patient and the referring provider.   Diagnosis: Memory Loss   FULL REPORT TO FOLLOW COMPLETION OF EVALUATION (i.e., testing and feedback)   Evalene DOROTHA Riff, PsyD [facility] [address] [place], [place] [address] Main: [phone] Fax: [phone] [place] License # [id number]  This report was generated using voice recognition software. While this document has been carefully reviewed, transcription errors may be present. I apologize in advance for any inconvenience. Please contact me if further clarification is needed.

## 2023-11-27 ENCOUNTER — Encounter: Payer: Self-pay | Admitting: Internal Medicine

## 2023-11-27 ENCOUNTER — Ambulatory Visit: Attending: Internal Medicine | Admitting: Internal Medicine

## 2023-11-27 VITALS — BP 96/54 | HR 82 | Ht 66.0 in | Wt 202.7 lb

## 2023-11-27 DIAGNOSIS — I480 Paroxysmal atrial fibrillation: Secondary | ICD-10-CM

## 2023-11-27 NOTE — Progress Notes (Signed)
 HPI Scott [name] returns today for followup. He is a pleasant 68 yo man with multiple medical problems including PAF s/p ablation, HTN, and remote tachy induced CM. In the interim, he has done well. He has gotten back to walking.  No chest pain or sob. No edema. He has undergone right hip replacement and is still not back to normal. No palpitations or symptoms of atrial fib.  Allergies  Allergen Reactions   Zolpidem Tartrate Other (See Comments)    Extremely drowsy the next day     Current Outpatient Medications  Medication Sig Dispense Refill   acetaminophen  (TYLENOL ) 500 MG tablet Take 1,000 mg by mouth every 6 (six) hours as needed for mild pain.      albuterol  (VENTOLIN  HFA) 108 (90 Base) MCG/ACT inhaler TAKE 2 PUFFS BY MOUTH EVERY 6 HOURS AS NEEDED FOR WHEEZE OR SHORTNESS OF BREATH 6.7 each 5   allopurinol  (ZYLOPRIM ) 100 MG tablet Take 1 tablet (100 mg total) by mouth daily. To lower uric acid and prevent gout 90 tablet 3   ALPRAZolam  (XANAX ) 0.5 MG tablet TAKE 1 TABLET BY MOUTH TWICE A DAY AS NEEDED FOR ANXIETY 60 tablet 2   amLODipine  (NORVASC ) 5 MG tablet Take 1 tablet (5 mg total) by mouth daily. 90 tablet 3   apixaban  (ELIQUIS ) 5 MG TABS tablet TAKE 1 TABLET BY MOUTH TWICE A DAY 180 tablet 0   cholecalciferol  (VITAMIN D ) 1000 units tablet Take 1,000 Units by mouth daily.     clotrimazole -betamethasone  (LOTRISONE ) cream Apply 1 Application topically daily. 30 g 1   co-enzyme Q-10 30 MG capsule Take 30 mg by mouth daily.     colchicine  0.6 MG tablet Take 1 tablet (0.6 mg total) by mouth daily as needed (gout pain). 90 tablet 3   cyclobenzaprine  (FLEXERIL ) 5 MG tablet Take 1 tablet (5 mg total) by mouth 3 (three) times daily as needed for muscle spasms. 40 tablet 1   docusate sodium  (COLACE) 100 MG capsule Take 200 mg by mouth daily as needed for mild constipation.      donepezil  (ARICEPT ) 5 MG tablet Take 1 tablet (5 mg total) by mouth at bedtime. 90 tablet 3    Fluticasone -Umeclidin-Vilant (TRELEGY ELLIPTA ) 100-62.5-25 MCG/ACT AEPB Take 1 inhalation once daily 180 each 3   furosemide  (LASIX ) 20 MG tablet TAKE 1 TABLET BY MOUTH EVERY DAY AS NEEDED 15 tablet 0   gabapentin  (NEURONTIN ) 100 MG capsule TAKE 1 CAPSULE BY MOUTH THREE TIMES A DAY 270 capsule 3   hydrochlorothiazide  (MICROZIDE ) 12.5 MG capsule Take 1 capsule (12.5 mg total) by mouth daily. 90 capsule 3   hydrocortisone  (ANUSOL -HC) 2.5 % rectal cream Place 1 Application rectally at bedtime. 30 g 2   lovastatin  (MEVACOR ) 40 MG tablet Take 1 tablet (40 mg total) by mouth daily. 180 tablet 3   metoprolol  succinate (TOPROL -XL) 50 MG 24 hr tablet TAKE 1 TABLETS once DAILY BY MOUTH 90 tablet 3   Multiple Vitamin (MULTIVITAMIN WITH MINERALS) TABS tablet Take 1 tablet by mouth daily.      NON FORMULARY Pt uses cpap nightly     olmesartan  (BENICAR ) 40 MG tablet Take 1 tablet (40 mg total) by mouth daily. 90 tablet 3   spironolactone  (ALDACTONE ) 25 MG tablet TAKE 0.5 TABLETS BY MOUTH AT BEDTIME. 45 tablet 2   temazepam  (RESTORIL ) 30 MG capsule TAKE 1 CAPSULE (30 MG TOTAL) BY MOUTH AT BEDTIME AS NEEDED. FOR SLEEP (Patient taking differently: Take 30 mg  by mouth at bedtime.) 30 capsule 5   tiZANidine  (ZANAFLEX ) 4 MG tablet Take 1 tablet (4 mg total) by mouth every 6 (six) hours as needed for muscle spasms. 40 tablet 1   traMADol  (ULTRAM ) 50 MG tablet TAKE 1 TABLET BY MOUTH EVERY 6 HOURS AS NEEDED 120 tablet 1   No current facility-administered medications for this visit.     Past Medical History:  Diagnosis Date   ALLERGIC RHINITIS 01/14/2007   Allergy    ANXIETY 09/28/2006   Asbestos exposure    Atrial fibrillation (HCC) 09/28/2006   s/p afib ablation x 2   CHF (congestive heart failure) (HCC)    CKD (chronic kidney disease), stage III (HCC)    patient denies   COPD (chronic obstructive pulmonary disease) (HCC)    Coronary artery disease 11/24/2011   Diastolic dysfunction 09/28/2006   Dyspnea     W/ EXERTION    Dysrhythmia    AFIB      First degree AV block 10/12/2018   Noted on EKG   Gallstones    Hyperglycemia    HYPERLIPIDEMIA 09/28/2006   HYPERTENSION 09/28/2006   HYPOTHYROIDISM, ACQUIRED NEC 09/28/2006   pt denies and doesn't know   Long term current use of anticoagulant 04/08/2010   Obese    OBSTRUCTIVE SLEEP APNEA 01/06/2008   NOT USING     Right knee DJD 07/09/2010   S/P CABG x 1 11/26/2011   Right internal mammary artery to right coronary artery   S/P Maze operation for atrial fibrillation 11/26/2011   complete biatrial lesion set with clipping of LA appendage    ROS:   All systems reviewed and negative except as noted in the HPI.   Past Surgical History:  Procedure Laterality Date   ANTERIOR CERVICAL DECOMP/DISCECTOMY FUSION N/A 06/20/2016   Procedure: Anterior Cervical Discectomy Fusion - Cervical five-Cervical six - Cervical six-Cervical seven - Cervicla seven- Thoracic one;  Surgeon: Victory Gunnels, MD;  Location: [facility] OR;  Service: Neurosurgery;  Laterality: N/A;   CARDIOVERSION     X4    CORONARY ARTERY BYPASS GRAFT  11/26/2011   Procedure: CORONARY ARTERY BYPASS GRAFTING (CABG);  Surgeon: Sudie VEAR Laine, MD;  Location[facility] OR;  Service: Open Heart Surgery;  Laterality: N/A;  coronary artery bypass on pump times one utilizing the right internal mammary artery, transesophageal echocardiogram    HAND SURGERY Left    KNEE ARTHROSCOPY Right 12/10/2021   Procedure: RIGH TKNEE ARTHROSCOPY WITH SCAR BAND EXCISION;  Surgeon: Sheril Coy, MD;  Location: [facility] ORS;  Service: Orthopedics;  Laterality: Right;   LEFT HEART CATHETERIZATION WITH CORONARY ANGIOGRAM N/A 11/24/2011   Procedure: LEFT HEART CATHETERIZATION WITH CORONARY ANGIOGRAM;  Surgeon: Peter M Swaziland, MD;  Location: [facility] CATH LAB;  Service: Cardiovascular;  Laterality: N/A;   LOOP RECORDER IMPLANT N/A 07/06/2012   Procedure: LOOP RECORDER IMPLANT;  Surgeon: Lynwood Rakers, MD;  Location: [facility] CATH LAB;  Service:  Cardiovascular;  Laterality: N/A;   MAZE  11/26/2011   Procedure: MAZE;  Surgeon: Sudie VEAR Laine, MD;  Location: [facility] OR;  Service: Open Heart Surgery;  Laterality: N/A;  Cryomaze    Radiofrequency Ablation for atrial fibrillation  12/30/07 and 06/05/09   afib ablation x 2 by [facility]   Radiofrequency ablation for atrial flutter  2005   CTI   TOTAL HIP ARTHROPLASTY Right 09/29/2023   Procedure: ARTHROPLASTY, HIP, TOTAL, ANTERIOR APPROACH;  Surgeon: Sheril Coy, MD;  Location: [facility] ORS;  Service: Orthopedics;  Laterality: Right;  TOTAL KNEE ARTHROPLASTY Right 04/05/2019   Procedure: RIGHT TOTAL KNEE ARTHROPLASTY;  Surgeon: Sheril Coy, MD;  Location: [facility] ORS;  Service: Orthopedics;  Laterality: Right;     Family History  Problem Relation Age of Onset   Dementia Father    Hypertension Father    Atrial fibrillation Mother    Stroke Mother    Dementia Paternal Grandfather    Cancer Paternal Grandmother        type unknown   Colon cancer Neg Hx    Esophageal cancer Neg Hx    Prostate cancer Neg Hx    Rectal cancer Neg Hx    Stomach cancer Neg Hx      Social History   Socioeconomic History   Marital status: Single    Spouse name: Not on file   Number of children: 0   Years of education: Not on file   Highest education level: Not on file  Occupational History   Occupation: works at the [employer] part-time  Tobacco Use   Smoking status: Former    Current packs/day: 0.00    Average packs/day: 1.5 packs/day for 26.0 years (39.0 ttl pk-yrs)    Types: Cigarettes    Start date: 04/05/1969    Quit date: 04/06/1995    Years since quitting: 28.6   Smokeless tobacco: Never  Vaping Use   Vaping status: Never Used  Substance and Sexual Activity   Alcohol use: No    Alcohol/week: 0.0 standard drinks of alcohol   Drug use: No   Sexual activity: Yes  Other Topics Concern   Not on file  Social History Narrative   Pt lives alone in [place], [place].  Has 2 dogs   Retired    Marine scientist Strain: Low Risk  (03/02/2023)   Overall Financial Resource Strain (CARDIA)    Difficulty of Paying Living Expenses: Not very hard  Food Insecurity: No Food Insecurity (09/29/2023)   Hunger Vital Sign    Worried About Running Out of Food in the Last Year: Never true    Ran Out of Food in the Last Year: Never true  Transportation Needs: No Transportation Needs (09/29/2023)   PRAPARE - Administrator, Civil Service (Medical): No    Lack of Transportation (Non-Medical): No  Physical Activity: Sufficiently Active (03/02/2023)   Exercise Vital Sign    Days of Exercise per Week: 3 days    Minutes of Exercise per Session: 90 min  Stress: No Stress Concern Present (03/02/2023)   [employer] of Occupational Health - Occupational Stress Questionnaire    Feeling of Stress : Not at all  Social Connections: Socially Isolated (09/29/2023)   Social Connection and Isolation Panel    Frequency of Communication with Friends and Family: Once a week    Frequency of Social Gatherings with Friends and Family: Once a week    Attends Religious Services: Never    Database administrator or Organizations: No    Attends Banker Meetings: Never    Marital Status: Never married  Intimate Partner Violence: Not At Risk (09/29/2023)   Humiliation, Afraid, Rape, and Kick questionnaire    Fear of Current or Ex-Partner: No    Emotionally Abused: No    Physically Abused: No    Sexually Abused: No     BP (!) 96/54   Pulse 82   Ht 5' 6 (1.676 m)   Wt 202 lb 11.2 oz (91.9 kg)  SpO2 95%   BMI 32.72 kg/m   Physical Exam:  Well appearing NAD HEENT: Unremarkable Neck:  No JVD, no thyromegally Lymphatics:  No adenopathy Back:  No CVA tenderness Lungs:  Clear HEART:  Regular rate rhythm, no murmurs, no rubs, no clicks Abd:  soft, positive bowel sounds, no organomegally, no rebound, no guarding Ext:  2 plus pulses, no edema, no cyanosis, no clubbing Skin:  No  rashes no nodules Neuro:  CN II through XII intact, motor grossly intact  Assess/Plan:  PAF - he appears to be mostly maintaining NSR. He has minimal palpitations. 2. Obesity - he has gained 6 lbs since his last visit. I encouraged him to try and lose weight. 3. HTN - his bp is well controlled. No change in meds. 4. Chronic diastolic heart failure - his symptoms are class 1. He will continue his current meds. He is encouraged to start walking again and maintain a low sodium diet.   Danelle Natthew Marlatt,MD

## 2023-11-27 NOTE — Patient Instructions (Signed)

## 2023-12-04 ENCOUNTER — Encounter

## 2023-12-04 DIAGNOSIS — R413 Other amnesia: Secondary | ICD-10-CM | POA: Diagnosis not present

## 2023-12-04 NOTE — Progress Notes (Signed)
 "  Mental Status/Behavioral Observations (12/04/2023):  Orientation: The patient was not fully oriented. He was oriented to self but not to place or time (He was able to name city/state but not type of place or name of hospital. He named the correct month/year but was unable to provide correct day, date, time).  Sensory/Arousal: Hearing and vision were adequate for testing. The patient was alert. Appearance: Dress and hygiene were appropriate for the setting.   Speech/Language: In conversation, the patient's speech was prosodic, fluent, and well-articulated. The patient displayed no indications of word finding difficulties and no word substitution errors were observed.  Motor: The patient ambulated with the assistance of a rollating walker. No tremors were observed. Social Comportment: Social behavior was largely appropriate to the setting. Mood/Affect: Mood was largely neutral to frustrated. Affect was consistent with mood.  Attention/Concentration:  The patient appeared to have variable engagement throughout the testing session. No frank attentional lapses were observed. Thought Process/Content: The patient's thought process was coherent, linear, goal directed. There were no indications of psychosis.  Additional Observations: The patient did not show any difficulties with understanding task instructions. He was vocal about not wanting to complete testing and frequently stated that he did not care about any of it. Certain subtests were discontinued early due to difficulties with frustration tolerance. Pt was offered the opportunity to discontinue testing if desired, however the patient opted to continue. Certain subtests were discontinued at patient request (see final report for additional information and discussion of validity concerns).   Neuropsychology Note Akira Adelsberger completed 90 minutes of neuropsychological testing with technician, Josue Ned, BA, under the supervision of  Evalene Riff, PsyD., Clinical Neuropsychologist. The patient did not appear overtly distressed by the testing session, per behavioral observation or via self-report to the technician. Rest breaks were offered.   Clinical Decision Making: In considering the patient's current level of functioning, level of presumed impairment, nature of symptoms, emotional and behavioral responses during clinical interview, level of literacy, and observed level of motivation/effort, a battery of tests was selected by Dr. Riff during initial consultation on 11/25/2023. This was communicated to the technician. Communication between the neuropsychologist and technician was ongoing throughout the testing session and changes were made as deemed necessary based on patient performance on testing, technician observations and additional pertinent factors such as those listed above.  Tests Administered: Automatic Data Edition (BNT-2) Brief Visuospatial Memory Test-Revised (BVMT-R) Benton Judgment of Line Orientation (JOLO) Delis-Kaplan Executive Function System (D-KEFS), select subtests Hopkins Verbal Learning Test - Revised (HVLT-R) Repeatable Battery for the Assessment of Neuropsychological Status Update (RBANS) Trail Making Test (TMT; Part A & B) Wechsler Adult Intelligence Scale-Fourth Edition (WAIS-IV), select subtests Wechsler Memory Scale-Fourth Edition (WMS-IV) , select subtests Wechsler Memory Scale-Third Edition (WMS-III), select subtests  Wechsler Test of Adult Reading (WTAR) The Beck Depression Inventory-II (BDI-II) Beck Anxiety Inventory (BAI)  Results: Note: This summary of test scores accompanies the interpretive report and should not be interpreted by unqualified individuals or in isolation without reference to the report. Test scores are relative to age, gender, and educational history as available and appropriate. Measurement properties of test scores: IQ, Index, and Standard Scores (SS): Mean =  100; Standard Deviation = 15; Scaled Scores (ss): Mean = 10; Standard Deviation = 3; Z scores (Z): Mean = 0; Standard Deviation = 1; T scores (T); Mean = 50; Standard Deviation = 10  Intellectual/Premorbid Functioning Estimate   Norm Score Percentile  Range  Wechsler Test of  Adult Reading  SS = 93 32 %ile Average   ATTENTION AND WORKING MEMORY    Norm Score Percentile  Range  WAIS-IV          Digit Span  ss = 3 1 %ile Exceptionally Low   DSF  ss = 5 5 %ile Below Average   Span:    4      DSB  ss = 5 5 %ile Below Average   Span:    2      DSS  ss = 3 1 %ile Exceptionally Low   Span:    3     WMS-III          Spatial Span  ss = 6 9 %ile Low Average   SSF  ss = 7 16 %ile Low Average   Span:    5      SSB  ss = 6 9 %ile Low Average   Span:    4      PROCESSING SPEED    Norm Score Percentile  Range  WAIS-IV          Coding  ss = 6 9 %ile Low Average   LANGUAGE    Norm Score Percentile  Range  Boston Naming Test (BNT-2)  t = 43 25 %ile Average    EXECUTIVE FUNCTIONING    Norm Score Percentile  Range  DKEFS - Color-Word Interference          Color Naming  ss = 1 0.1 %ile Exceptionally Low   Word Reading  ss = 7 16 %ile Low Average   Inhibition   ss =   %ile Discontinued (unable to complete practice trial)   Errors  ss =   %ile    Inhibition Switching  ss =   %ile Discontinued on Condition 3   Errors  ss =   %ile   Trails A  t = 38 12 %ile Low Average   Errors    0     Trails B  t =   %ile Discontinued   Errors          MEMORY    Norm Score Percentile  Range  BVMT-R          Trial 1  t = 25.0 1 %ile Exceptionally Low   Trial 2  t = 24 0.5 %ile Exceptionally Low   Trial 3  t = <20 <0.1 %ile Exceptionally Low   Total Recall  t = <20 <0.1 %ile Exceptionally Low   Learning  t = 41.0 18 %ile Low Average   Delayed Recall  t = 23.0 0.3 %ile Exceptionally Low   % Retained    100 >16 %ile WNL   Hits     <1 %ile Exceptionally Low   False Alarms     >16 %ile WNL   Recognition  Discriminability     <1 %ile Exceptionally Low  HVLT          Total Recall  t = <20 <0.1 %ile Exceptionally Low   Delayed Recall  t = <20 <0.1 %ile Exceptionally Low   %Retention  t = <20 <0.1 %ile Exceptionally Low   Recognition Discriminability  t = <=20  <0.1 %ile Exceptionally Low  Wechsler Memory Scale, 4th Edition (WMS-4)         Log. Mem. Immediate Recall  ss = 1 0.1 %ile Exceptionally Low   Logical Memory Delayed Recall  ss = 2 0.4 %ile Exceptionally Low  Logical Recognition    <=2nd   %ile Exceptionally Low   VISUAL-SPATIAL    Norm Score Percentile  Range  WAIS-IV          Block Design  ss = 8 25 %ile Discontinued  Benton JOLO  ss = 5 5 %ile Below Average            RBANS Visuospatial Index          RBANS Figure Copy  ss = 8 25 %ile Average   PERSONALITY AND BEHAVIORAL FUNCTIONING      Score/Interpretation  BDI Raw       12  BDI Severity       Minimal.  BAI Raw       15  BAI Severity       Mild.   Feedback to Patient: Mahmud Keithly will return on 12/18/2023 for an interactive feedback session with Dr. Hayden at which time his test performances, clinical impressions and treatment recommendations will be reviewed in detail. The patient understands he can contact our office should he require our assistance before this time.  90 minutes spent face-to-face with patient administering standardized tests, 60 minutes spent scoring radiographer, therapeutic). [CPT [id number], [id number]]  Full report to follow. "

## 2023-12-05 DIAGNOSIS — Z9889 Other specified postprocedural states: Secondary | ICD-10-CM | POA: Diagnosis not present

## 2023-12-05 DIAGNOSIS — Z96641 Presence of right artificial hip joint: Secondary | ICD-10-CM | POA: Diagnosis not present

## 2023-12-14 ENCOUNTER — Encounter: Admitting: Psychology

## 2023-12-14 DIAGNOSIS — R4189 Other symptoms and signs involving cognitive functions and awareness: Secondary | ICD-10-CM

## 2023-12-17 DIAGNOSIS — M6281 Muscle weakness (generalized): Secondary | ICD-10-CM | POA: Diagnosis not present

## 2023-12-17 DIAGNOSIS — Z96641 Presence of right artificial hip joint: Secondary | ICD-10-CM | POA: Diagnosis not present

## 2023-12-17 DIAGNOSIS — M25651 Stiffness of right hip, not elsewhere classified: Secondary | ICD-10-CM | POA: Diagnosis not present

## 2023-12-18 ENCOUNTER — Encounter: Admitting: Psychology

## 2023-12-22 DIAGNOSIS — M25651 Stiffness of right hip, not elsewhere classified: Secondary | ICD-10-CM | POA: Diagnosis not present

## 2023-12-22 DIAGNOSIS — Z96641 Presence of right artificial hip joint: Secondary | ICD-10-CM | POA: Diagnosis not present

## 2023-12-22 DIAGNOSIS — M6281 Muscle weakness (generalized): Secondary | ICD-10-CM | POA: Diagnosis not present

## 2023-12-24 DIAGNOSIS — M25651 Stiffness of right hip, not elsewhere classified: Secondary | ICD-10-CM | POA: Diagnosis not present

## 2023-12-24 DIAGNOSIS — Z96641 Presence of right artificial hip joint: Secondary | ICD-10-CM | POA: Diagnosis not present

## 2023-12-24 DIAGNOSIS — M6281 Muscle weakness (generalized): Secondary | ICD-10-CM | POA: Diagnosis not present

## 2023-12-29 DIAGNOSIS — M25651 Stiffness of right hip, not elsewhere classified: Secondary | ICD-10-CM | POA: Diagnosis not present

## 2023-12-29 DIAGNOSIS — M6281 Muscle weakness (generalized): Secondary | ICD-10-CM | POA: Diagnosis not present

## 2023-12-29 DIAGNOSIS — Z96641 Presence of right artificial hip joint: Secondary | ICD-10-CM | POA: Diagnosis not present

## 2023-12-31 DIAGNOSIS — Z96641 Presence of right artificial hip joint: Secondary | ICD-10-CM | POA: Diagnosis not present

## 2023-12-31 DIAGNOSIS — M25651 Stiffness of right hip, not elsewhere classified: Secondary | ICD-10-CM | POA: Diagnosis not present

## 2023-12-31 DIAGNOSIS — M6281 Muscle weakness (generalized): Secondary | ICD-10-CM | POA: Diagnosis not present

## 2024-01-04 DIAGNOSIS — Z471 Aftercare following joint replacement surgery: Secondary | ICD-10-CM | POA: Diagnosis not present

## 2024-01-04 DIAGNOSIS — Z96641 Presence of right artificial hip joint: Secondary | ICD-10-CM | POA: Diagnosis not present

## 2024-01-05 DIAGNOSIS — M25651 Stiffness of right hip, not elsewhere classified: Secondary | ICD-10-CM | POA: Diagnosis not present

## 2024-01-05 DIAGNOSIS — Z96641 Presence of right artificial hip joint: Secondary | ICD-10-CM | POA: Diagnosis not present

## 2024-01-05 DIAGNOSIS — M6281 Muscle weakness (generalized): Secondary | ICD-10-CM | POA: Diagnosis not present

## 2024-01-07 ENCOUNTER — Other Ambulatory Visit: Payer: Self-pay | Admitting: Internal Medicine

## 2024-01-07 ENCOUNTER — Other Ambulatory Visit: Payer: Self-pay

## 2024-01-07 DIAGNOSIS — M25651 Stiffness of right hip, not elsewhere classified: Secondary | ICD-10-CM | POA: Diagnosis not present

## 2024-01-07 DIAGNOSIS — M6281 Muscle weakness (generalized): Secondary | ICD-10-CM | POA: Diagnosis not present

## 2024-01-07 DIAGNOSIS — Z96641 Presence of right artificial hip joint: Secondary | ICD-10-CM | POA: Diagnosis not present

## 2024-01-12 DIAGNOSIS — Z96641 Presence of right artificial hip joint: Secondary | ICD-10-CM | POA: Diagnosis not present

## 2024-01-12 DIAGNOSIS — M6281 Muscle weakness (generalized): Secondary | ICD-10-CM | POA: Diagnosis not present

## 2024-01-12 DIAGNOSIS — M25651 Stiffness of right hip, not elsewhere classified: Secondary | ICD-10-CM | POA: Diagnosis not present

## 2024-01-14 DIAGNOSIS — M6281 Muscle weakness (generalized): Secondary | ICD-10-CM | POA: Diagnosis not present

## 2024-01-14 DIAGNOSIS — Z96641 Presence of right artificial hip joint: Secondary | ICD-10-CM | POA: Diagnosis not present

## 2024-01-14 DIAGNOSIS — M25651 Stiffness of right hip, not elsewhere classified: Secondary | ICD-10-CM | POA: Diagnosis not present

## 2024-01-20 DIAGNOSIS — M6281 Muscle weakness (generalized): Secondary | ICD-10-CM | POA: Diagnosis not present

## 2024-01-20 DIAGNOSIS — M25651 Stiffness of right hip, not elsewhere classified: Secondary | ICD-10-CM | POA: Diagnosis not present

## 2024-01-20 DIAGNOSIS — Z96641 Presence of right artificial hip joint: Secondary | ICD-10-CM | POA: Diagnosis not present

## 2024-01-26 DIAGNOSIS — Z96641 Presence of right artificial hip joint: Secondary | ICD-10-CM | POA: Diagnosis not present

## 2024-01-26 DIAGNOSIS — M6281 Muscle weakness (generalized): Secondary | ICD-10-CM | POA: Diagnosis not present

## 2024-01-26 DIAGNOSIS — M25651 Stiffness of right hip, not elsewhere classified: Secondary | ICD-10-CM | POA: Diagnosis not present

## 2024-01-28 DIAGNOSIS — M6281 Muscle weakness (generalized): Secondary | ICD-10-CM | POA: Diagnosis not present

## 2024-01-28 DIAGNOSIS — M25651 Stiffness of right hip, not elsewhere classified: Secondary | ICD-10-CM | POA: Diagnosis not present

## 2024-01-28 DIAGNOSIS — Z96641 Presence of right artificial hip joint: Secondary | ICD-10-CM | POA: Diagnosis not present

## 2024-02-01 DIAGNOSIS — Z96641 Presence of right artificial hip joint: Secondary | ICD-10-CM | POA: Diagnosis not present

## 2024-02-01 DIAGNOSIS — M25551 Pain in right hip: Secondary | ICD-10-CM | POA: Diagnosis not present

## 2024-02-04 DIAGNOSIS — M6281 Muscle weakness (generalized): Secondary | ICD-10-CM | POA: Diagnosis not present

## 2024-02-04 DIAGNOSIS — M25651 Stiffness of right hip, not elsewhere classified: Secondary | ICD-10-CM | POA: Diagnosis not present

## 2024-02-04 DIAGNOSIS — Z96641 Presence of right artificial hip joint: Secondary | ICD-10-CM | POA: Diagnosis not present

## 2024-02-28 NOTE — Progress Notes (Incomplete)
 "  NEUROPSYCHOLOGICAL EVALUATION [place]  Physical Medicine and Rehabilitation     Patient: Scott [name]  MRN: [id number] DOB: Oct 14, 1955  Age: 69 y.o. Sex: male  Race/Ethnicity: White or Caucasian  Years of Education: 15  Referring Provider:  Buck Saucer, MD, PhD Provider/Clinical Neuropsychologist: Evalene DOROTHA Riff, PsyD  Date of Service: 12/14/2023 Start Time: 11 AM End Time: 12 PM Location of Service:  [facility] Physical Medicine & Rehabilitation Department [place] [address], [address], [place], [place] [address] Billing Code/Service: [phone]  Individuals Present: Evalene Riff, PsyD 1 hour was spent on interpretation of patient data, interpretation of standardized test results and clinical data, clinical decision making, initial treatment planning/recommendations, and report writing. The report will be amended as needed based on any additional information collected during interactive feedback session.   REASON FOR REFERRAL:The patient was referred for neuropsychological evaluation by his neurologist, Dr. Buck, due to concerns for memory loss.  The patient has a medical history of of hypertension, hyperglycemia, hyperlipidemia, chronic kidney disease, chest pain, vitamin D  deficiency, B12 deficiency, bradycardia, osteoarthritis, allergies, anxiety, coronary artery disease (s/p CABG 2013), atrial fibrillation with (s/p cardioversion x 4, ablation), on chronic anticoagulation, congestive heart failure, COPD, OSA (CPAP compliant), obesity, s/p multiple surgeries including right total knee replacement in 2021 and subsequent arthroscopic right knee surgery in 2023, A-fib ablation in 2009 in 2011, maze procedure in 2013, loop recorder implantation in 2014, (s/p ACDF in 2018). Records indicate he recently underwent surgery for hip replacement in 09/30/2023. Per neurology visit notes (07/28/23), the patient has family history of dementia in  his paternal grandfather (uncertain if it was Alzheimer's). 04/24/23 MRI was reported, per records, to show ... 2. Minimal chronic small vessel ischemic changes within the cerebral white matter. 3. Nonspecific punctate chronic microhemorrhage within left frontal lobe. 4. Mild generalized cerebral atrophy. 11/27/2023 ATN Profile results (A+T+N+), concerning for Alzheimer's related pathology. In addition, the patient has multiple vascular risk factors, and there was some concern for risk of polypharmacy.   The patient presented to the clinical interview unaccompanied. The patient presented for check-in for the evaluation but did not recall the reason for the referral. Admin staff provided a general description about cognitive evaluations. The patient indicated he was not interested in proceeding and began to leave. The provider then spoke to the patient and the patient agreed to allow the provider to provide more details. The provided explained the evaluation process in depth and shared information about the reason for referral based on medical record review. The patient had significant concerns about privacy, which the provider addressed, explaining that information from the evaluation is considered protected health information, and reviewing privacy protections and that information would not be shared with anyone outside of his medical providers without his consent. The patient was expressed understanding and was amenable to proceeding.   HISTORY OF PRESENTING CONCERNS:   Cognitive Symptom Onset & Course: Information was obtained from the patient via clinical interview.  The patient indicated that his sister had expressed some concerns about cognitive changes although did not want her involved in the evaluation itself (for collateral interview etc.) and indicated that they are not close and do not see eye-to-eye often.  The patient indicated that he thinks he may have had some cognitive changes over the last  2 years that were gradual in onset.  He indicated that he feels these are stable.  Current Cognitive Complaints:  Memory: The patient  decayed that when he does not recall details of conversations it is usually because he is not paying adequate attention.  He denied any significant problems keeping up with his medical appointments or remembering to pay his bills.  He denied any changes with respect to misplacing things.  He denied any problems getting lost in familiar locations. Processing Speed: He offered that there may be slight changes in his thinking speed. Attention & Concentration: He endorsed some minor declines in attention and concentration. Language: He endorsed changes in word finding.  He denied problems in auditory-verbal comprehension.  He denied any changes in his reading abilities.  He indicated mental arithmetic may be slightly more difficult. Visual-Spatial: He denied any problems with visual-spatial abilities and indicated that he works on his motorcycles without issue.  Executive Functioning: He indicated that decision making may be slightly more challenging from time to time.  He denied any significant problems in planning or organization.  He denied any big changes in personality or behavior.  Motor/Sensory Complaints:   Sensory changes: He reported long running issues with sense of smell and taste (almost life-long).  He indicated that hearing is adequate although he does have tinnitus for as long as I can remember. He wears glasses and can see adequately with them.  Balance/coordination difficulties: No significant changes or problems with balance. Frequent instances of dizziness/vertigo: Some slight dizziness if getting up too quickly. Other motor difficulties: No tremors or other motor functioning complaints.  Emotional and Behavioral Functioning:  The patient denied any significant psychiatric history. Depression Sx: He denied any problems with feeling down or blue  frequently.  He denied any anhedonia.  He denied any suicidal ideation. Anxiety Sx: He denied having difficulties with excessive worry, feelings of tension, difficulty relaxing, or other clear indications of anxiety. Other: He denied any current or past suicidal ideation, homicidal ideation, mania, hallucinations, delusions, or inpatient psychiatric admission.  Sleep: He denied any problems with sleep onset or maintenance.  He indicates he typically gets about 6 hours of sleep per night and feels rested in the morning. Appetite: No significant changes in appetite or unintentional weight loss or gain. Caffeine: None. Alcohol Use: None in many years. Tobacco Use: Quit smoking about 20 years ago. Recreational Substance Use: None.  Academic/Vocational History: Graduated high school and completed 3 years of college. Endorsed some mild attentional difficulties growing up. 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 Stopped working and had to go on disability for COPD at least 10 years ago.  Psychosocial: Marital Status: Single Children/Grandchildren: None Living Situation: Alone with his dog [employer] [place])  Level of Functional Independence: The patient is intact with basic activities of daily living.  Finances: Denied any problems with remembering to pay bills.  He indicated he writes everything down. Shopping / Meal Preparation: Intact. Household Maintenance / Chores: Intact. Tracking Appointments / Future Obligations: Manages by writing it down and putting on a calendar.  Denied any problems making it to appointments. Medication Management: Utilizes a pill organizer's.  He occasionally forgets morning medications. Driving: Denied any problems.  Medical History/Record Review: Per records and patient self-report; History of traumatic brain injury/concussion: Remote history of head injury 3-4x. LOC 2x. Denied residual symptoms. History of stroke: None History of heart attack: None, but  reported he has heart failure.  History of cancer/chemotherapy: None History of seizure activity: None Symptoms of chronic pain: Minimal Experience of frequent headaches/migraines: None  Past Medical History:  Diagnosis Date   ALLERGIC RHINITIS 01/14/2007   Allergy  ANXIETY 09/28/2006   Asbestos exposure    Atrial fibrillation (HCC) 09/28/2006   s/p afib ablation x 2   CHF (congestive heart failure) (HCC)    CKD (chronic kidney disease), stage III (HCC)    patient denies   COPD (chronic obstructive pulmonary disease) (HCC)    Coronary artery disease 11/24/2011   Diastolic dysfunction 09/28/2006   Dyspnea    W/ EXERTION    Dysrhythmia    AFIB      First degree AV block 10/12/2018   Noted on EKG   Gallstones    Hyperglycemia    HYPERLIPIDEMIA 09/28/2006   HYPERTENSION 09/28/2006   HYPOTHYROIDISM, ACQUIRED NEC 09/28/2006   pt denies and doesn't know   Long term current use of anticoagulant 04/08/2010   Obese    OBSTRUCTIVE SLEEP APNEA 01/06/2008   NOT USING     Right knee DJD 07/09/2010   S/P CABG x 1 11/26/2011   Right internal mammary artery to right coronary artery   S/P Maze operation for atrial fibrillation 11/26/2011   complete biatrial lesion set with clipping of LA appendage   Patient Active Problem List   Diagnosis Date Noted   Primary localized osteoarthritis of right hip 09/29/2023   Primary osteoarthritis of right hip 09/29/2023   Memory changes 04/08/2023   Bradycardia 10/06/2022   Pain and swelling of right wrist 10/06/2022   Left ankle pain 04/07/2022   Left cataract 12/30/2021   Rash 11/14/2021   Axillary lymphadenopathy 11/14/2021   Low back pain 01/01/2021   Lower GI bleeding 01/01/2021   Pain due to total right knee replacement 01/09/2020   Primary osteoarthritis of right knee 04/05/2019   CKD (chronic kidney disease) stage 3, GFR 30-59 ml/min (HCC) 03/14/2019   Degenerative joint disease of knee, right 09/27/2018   Dyspnea 09/06/2018    Right knee pain 09/06/2018   Radiculitis of left cervical region 03/05/2018   Plantar fasciitis, left 08/19/2017   Chest pain 02/27/2017   Right ear pain 02/27/2017   Left groin pain 09/13/2016   Rib pain on left side 09/13/2016   Left leg pain 09/13/2016   Stenosis of cervical spine with myelopathy (HCC) 06/20/2016   Special screening for malignant neoplasms, colon 04/04/2016   Cough 01/11/2016   COPD exacerbation (HCC) 12/22/2015   Rectal bleeding 08/14/2014   Intercostal muscle strain 08/07/2014   Chronic meniscal tear of knee 04/06/2014   Heel bone fracture 04/06/2014   Hyperglycemia 04/04/2014   Recurrent falls 04/04/2014   Bilateral knee pain 04/04/2014   Intractable left heel pain 04/04/2014   Insomnia 04/04/2014   Hematochezia 04/04/2014   Bladder neck obstruction 12/19/2013   Encounter for therapeutic drug monitoring 03/30/2013   Palpitations 07/06/2012   Shortness of breath 01/19/2012   Sick sinus syndrome (HCC) 12/01/2011   S/P CABG x 1 11/26/2011   S/P Maze operation for atrial fibrillation 11/26/2011   CAD (coronary artery disease) 11/25/2011   Paroxysmal atrial fibrillation (HCC) 11/24/2011   Chronic combined systolic (congestive) and diastolic (congestive) heart failure (HCC) 11/24/2011   Gross hematuria 12/12/2010   Left knee pain 12/12/2010   Left knee DJD 07/09/2010   Encounter for well adult exam with abnormal findings 07/07/2010   Chronic anticoagulation 04/08/2010   COPD (chronic obstructive pulmonary disease) (HCC) 10/04/2008   OBSTRUCTIVE SLEEP APNEA 01/06/2008   Allergic rhinitis 01/14/2007   Hypothyroidism 09/28/2006   Hyperlipidemia 09/28/2006   Anxiety state 09/28/2006   Essential hypertension 09/28/2006   Imaging/Lab Results: See record review  Family Neurologic/Medical Hx:  Family History  Problem Relation Age of Onset   Dementia Father    Hypertension Father    Atrial fibrillation Mother    Stroke Mother    Dementia Paternal  Grandfather    Cancer Paternal Grandmother        type unknown   Colon cancer Neg Hx    Esophageal cancer Neg Hx    Prostate cancer Neg Hx    Rectal cancer Neg Hx    Stomach cancer Neg Hx    Medications:  acetaminophen  (TYLENOL ) 500 MG tablet albuterol  (VENTOLIN  HFA) 108 (90 Base) MCG/ACT inhaler allopurinol  (ZYLOPRIM ) 100 MG tablet ALPRAZolam  (XANAX ) 0.5 MG tablet  (Per interview; taken at night for sleep.  Reported limited use most recently) amLODipine  (NORVASC ) 5 MG tablet apixaban  (ELIQUIS ) 5 MG TABS tablet cholecalciferol  (VITAMIN D ) 1000 units tablet clotrimazole -betamethasone  (LOTRISONE ) cream co-enzyme Q-10 30 MG capsule colchicine  0.6 MG tablet cyclobenzaprine  (FLEXERIL ) 5 MG tablet  (Per interview; indicated he rarely takes this) docusate sodium  (COLACE) 100 MG capsule donepezil  (ARICEPT ) 5 MG tablet Fluticasone -Umeclidin-Vilant (TRELEGY ELLIPTA ) 100-62.5-25 MCG/ACT AEPB furosemide  (LASIX ) 20 MG tablet gabapentin  (NEURONTIN ) 100 MG capsule   (Per interview; two times a day) hydrochlorothiazide  (MICROZIDE ) 12.5 MG capsule hydrocortisone  (ANUSOL -HC) 2.5 % rectal cream lovastatin  (MEVACOR ) 40 MG tablet metoprolol  succinate (TOPROL -XL) 50 MG 24 hr tablet Multiple Vitamin (MULTIVITAMIN WITH MINERALS) TABS tablet NON FORMULARY olmesartan  (BENICAR ) 40 MG tablet spironolactone  (ALDACTONE ) 25 MG tablet temazepam  (RESTORIL ) 30 MG capsule tiZANidine  (ZANAFLEX ) 4 MG tablet traMADol  (ULTRAM ) 50 MG tablet  (Per interview; 1 per day on average)  NEUROPSYCHOLOGICAL TEST RESULTS:  Mental Status/Behavioral Observations (12/04/2023):  Orientation: The patient was not fully oriented. He was oriented to self but not to place or time (He was able to name city/state but not type of place or name of hospital. He named the correct month/year but was unable to provide correct day, date, time).  Sensory/Arousal: Hearing and vision were adequate for testing. The patient was  alert. Appearance: Dress and hygiene were appropriate for the setting.   Speech/Language: In conversation, the patient's speech was prosodic, fluent, and well-articulated. The patient displayed no indications of word finding difficulties and no word substitution errors were observed.  Motor: The patient ambulated with the assistance of a rollating walker. No tremors were observed. Social Comportment: Social behavior was largely appropriate to the setting. Mood/Affect: Mood was largely neutral to frustrated. Affect was consistent with mood.  Attention/Concentration:  The patient had variable engagement throughout the testing session. No frank attentional lapses were observed. Thought Process/Content: The patient's thought process was coherent, linear, goal directed. There were no indications of psychosis.  Additional Observations: The patient did not show any difficulties with understanding task instructions. He was vocal about not wanting to complete testing and frequently stated that he did not care about any of it. Certain subtests were discontinued early due to difficulties with frustration tolerance (refusal). Pt was offered the opportunity to discontinue testing if desired, however the patient opted to continue. Certain subtests were discontinued at patient request (see final report for additional information and discussion of validity concerns).   Tests Administered: Automatic Data Edition (BNT-2) Brief Visuospatial Memory Test-Revised (BVMT-R) Benton Judgment of Line Orientation (JOLO) Delis-Kaplan Executive Function System (D-KEFS), select subtests Hopkins Verbal Learning Test - Revised (HVLT-R) Repeatable Battery for the Assessment of Neuropsychological Status Update (RBANS) Trail Making Test (TMT; Part A & B) Wechsler Adult Intelligence Scale-Fourth Edition (WAIS-IV), select subtests Wechsler Memory Scale-Fourth Edition (  WMS-IV) , select subtests Wechsler Memory Scale-Third  Edition (WMS-III), select subtests  Wechsler Test of Adult Reading (WTAR) The Beck Depression Inventory-II (BDI-II) Beck Anxiety Inventory (BAI)  Neuropsychological Test Results  Performance Validity:  Performances are interpreted with caution due to threats to validity from variable patient engagement with testing. Variable patient engagement with testing and difficulties with frustration tolerance were notable.  The patient declined to attempt some test and data was subsequently not obtained regarding certain cognitive functions. In some instances the patient was willing to complete an alternative test (RBANS Figure Copy rather than Rey Complex Figure Test - Copy trial), but this option was not always possible (verbal fluency measures). In the figure copy, the alternative version was relatively less complex. The patient discontinued / declined to continue some tests after beginning them.  Embedded performance validity metrics were variable, with two falling outside of normal limits (but ones susceptible to false positives in neurocognitive disorder populations) and the third falling at the limit of the normal range with use of more liberal cutoff scores to reduce risk of false positive findings of sub-optimal effort in older adults. No stand-alone measures were administered given limited compliance.  The provider stepped in to speak with the patient during the testing visit due to engagement concerns reported by the technician. The patient was reminded that testing was completely voluntary and that the patient could discontinue at any time with no negative outcome aside from likely limits in what the evaluation could conclude with limited data. The patient indicated he wished to proceed.  There is significant concern for cognitive deficits based on behavioral observations during intake and testing, strong suggestion of memory difficulties based on interactions observed with other staff, and suspicion  that frustration with tests reflected genuine cognitive difficulties. Statistical / quantitative performances nevertheless of of questionable reliability in several instances given threats to validity. Scores are therefore interpreted with significant caution and notable contextual factors from behavioral observations in the testing session itself are highlighted. Performance was marginal on one embedded performance validity test. Embedded metrics on other measures are ones susceptible to false positives in dementia evaluation and were not utilized. Stand-alone performance validity testing was not conducted given challenges in obtaining even core cognitive functioning data.   Test scores are relative to age, gender, and educational history as available and appropriate. Measurement properties of test scores: IQ, Index, and Standard Scores (SS): Mean = 100; Standard Deviation = 15; Scaled Scores (ss): Mean = 10; Standard Deviation = 3; Z scores (Z): Mean = 0; Standard Deviation = 1; T scores (T); Mean = 50; Standard Deviation = 10  Intellectual/Premorbid Functioning Estimate   Norm Score Percentile  Range  Wechsler Test of Adult Reading  SS = 93 32 %ile Average   ATTENTION AND WORKING MEMORY    Norm Score Percentile  Range  WAIS-IV           Digit Span  ss = 3 1 %ile Exceptionally Low   --DS Forward  ss = 5   Below Average   --DS Backward  ss = 5   Below Average   --DS Sequencing  ss = 3   Exceptionally Low  WMS-III          Spatial Span  ss = 6 9 %ile Low Average   PROCESSING SPEED    Norm Score Percentile  Range  WAIS-IV          Coding  ss = 6 9 %ile Low Average  LANGUAGE    Norm Score Percentile  Range  Boston Naming Test (BNT-2)  t = 43 25 %ile Average  COWAT (FAS + Animal)       Refused   EXECUTIVE FUNCTIONING    Norm Score Percentile  Range  DKEFS - Color-Word Interference          Color Naming  ss = 1 0.1 %ile Exceptionally Low   Word Reading  ss = 7 16 %ile Low Average    Inhibition   ss = -- -- %ile Discontinued   Errors  ss = -- -- %ile --   Inhibition Switching  ss = -- -- %ile --   Errors  ss = -- -- %ile --  Trails A  t = 38 12 %ile Low Average   Errors    0     Trails B  t =   %ile Discontinued Early/Refused   Errors          MEMORY       Norm Score Percentile   Range  BVMT-R                         See below  HVLT                         See below  Wechsler Memory Scale, 4th Edition (WMS-4)                    See below   VISUAL-SPATIAL    Norm Score Percentile  Range  WAIS-IV          Block Design  ss = 8 25 %ile Average  Benton JOLO  ss = 5 5 %ile Below Average            RBANS Visuospatial Index          RBANS Figure Copy  ss = 8 25 %ile Average   PERSONALITY AND BEHAVIORAL FUNCTIONING      Score/Interpretation  BDI Raw       12  BDI Severity       Minimal.  BAI Raw       15  BAI Severity       Mild.  Premorbid cognitive abilities are estimated to be average at minimum based on word-reading/recognition test and history. The patient declined to attempt a large minority of items on the recognition test and thus may potentially have scored higher.  Auditory-verbal attention performance was scored in the exceptionally low score range. Subtest scores were relatively consistent. Visual attention performance was low average Processing speed was scored in the low average range by age.  Confrontation-naming/word retrieval was average by age and education.  The patient declined to attempt the verbal fluency test. No viable alternative could be offered.  The patient was unable to complete the practice trial of a response-inhibition task, and the task was discontinued per administration protocol. Speed with a basic number sequencing task was low average by age and education. The patient declined to complete the sequencing/set-shifting test trial (practice was completed successfully) after early difficulty.  Scores on memory testing are of unclear  reliability, and may be an under-representation of cognitive functioning and thus not reported in their entirety. Qualitatively, there was signs of retention over time on the visual memory test with free recall, and the recognition trial was technically perfectly consistent with initial encoding as he identified the one target figure  he initially learned and had zero false positive errors. However, the amount of information was limited. Performances on the two verbal memory tests are almost exclusively at the lower limit of the score range, but qualitatively may be showing some information retention (story cues, provided when unable to generate any information with delayed free recall, were provided and could result in false positive). Validity for the word-list memory test (HVLT) was compromised due to patient initial refusal to proceed after trial one exposure. After speaking to the provider, he chose to proceed, but encoding/exposure trials could not follow standard administration requirements.  The patient score average on a visual construction task involving replicating two-dimensional figures using three dimensional blocks, but this is a minimum limit estimate as he refused to proceed mid-task. Line orientation judgment was below average. He refused to complete the initial figure-copy task but agreed to complete a less complex design and scored average.  Rating scores showed notable, but sub-threshold symptoms of depression and mild symptoms of anxiety.   SUMMARY / CLINICAL IMPRESSIONS The patient was referred for neuropsychological evaluation by his neurologist, Dr. Buck, due to concerns for memory loss.  The patient has a medical history of of hypertension, hyperglycemia, hyperlipidemia, chronic kidney disease, chest pain, vitamin D  deficiency, B12 deficiency, bradycardia, osteoarthritis, allergies, anxiety, coronary artery disease (s/p CABG 2013), atrial fibrillation with (s/p cardioversion x 4,  ablation), on chronic anticoagulation, congestive heart failure, COPD, OSA (CPAP compliant), obesity, s/p multiple surgeries including right total knee replacement in 2021 and subsequent arthroscopic right knee surgery in 2023, A-fib ablation in 2009 in 2011, maze procedure in 2013, loop recorder implantation in 2014, (s/p ACDF in 2018). Records indicate he recently underwent surgery for hip replacement in 09/30/2023. Per neurology visit notes (07/28/23), the patient has family history of dementia in his paternal grandfather (uncertain if it was Alzheimer's). 04/24/23 MRI was reported, per records, to show ... 2. Minimal chronic small vessel ischemic changes within the cerebral white matter. 3. Nonspecific punctate chronic microhemorrhage within left frontal lobe. 4. Mild generalized cerebral atrophy. 11/27/2023 ATN Profile results (A+T+N+), concerning for Alzheimer's related pathology. In addition, the patient has multiple vascular risk factors, and there was some concern for risk of polypharmacy.   The patient presented to the clinical interview unaccompanied. The patient presented for check-in for the evaluation but did not recall the reason for the referral. Admin staff provided a general description about cognitive evaluations. The patient indicated he was not interested in proceeding and began to leave. The provider then spoke to the patient and the patient agreed to allow the provider to provide more details. The provided explained the evaluation process in depth and shared information about the reason for referral based on medical record review. The patient had significant concerns about privacy, which the provider addressed, explaining that information from the evaluation is considered protected health information, and reviewing privacy protections and that information would not be shared with anyone outside of his medical providers without his consent. The patient was expressed understanding and was amenable  to proceeding.   The patient endorsed only mild cognitive complaints. Reason for proceeding with the evaluation appeared to be primarily external / possibly family related as his sister reportedly had encouraged him. He denied any significant changes in functioning related to cognitive changes. No significant psychiatric history.   Neuropsychological test performances are interpreted with significant caution due to threats to validity. Scores are viewed as lower limit estimates of cognitive ability as a result. Some results are  reported when validity concerns were slightly lower or when data showed intact performance by age. Some tasks appeared to reflect genuine cognitive difficulty which led to early discontinuation (per protocol on color-word inhibition, and outside of protocol with [facility]. Patient dis-engagement correlated strongly with frustration and task difficulty, and the point at which this occurred within particular tests suggested greater than is typical difficulty.  Ultimately, validity issues limit the ability to confidently conclude data reflects genuine cognitive impairment, characteristics of that impairment, and conclusions regarding most likely etiology.   The presence of biomarkers for Alzheimer's pathology is concerning. Observations of memory difficulty align with that etiology. Unfortunately, memory test data is not adequately reliable to assert objective cognitive deficit presence/absence. Qualitative data in memory testing was ambiguous. In one instance, retention was 100%, but the amount of information initially learned was exceptionally low. However, the recognition performance and absence of any false positives and encoding-congruent endorsement with the single endorsed true positive is not clearly an amnestic pattern. Validity of one verbal memory test was compromised and the other showed possible retention (qualitatively) although that could be a false positive as cueing  was provided at the delayed recall as the patient was unable to generate any responses initially in delayed free recall. Behavioral observations suggest presence of some degree of memory difficulties. The absence of verbal fluency data is particularly problematic given it's utility in AD vs alternative neurodegenerative etiology. Cerebrovascular disease is of some concern, particularly given cardiovascular conditions, although imaging is not striking in this respect. Data could align with that etiology, potentially. The patient is on multiple medications with potential to impact cognition, and these cannot be ruled out as possible contributors. Activities of daily living are not able to be verified, and high degree of reticence in clinical interview raises concerns for potential under-reporting.    Ultimately, I am concerned for a neurocognitive disorder but validity of data and absence of collateral are significant barriers to determining if diagnostic criteria are met.   At the time of this report, feedback with the patient has not been completed and the provider has not had the opportunity to discuss the results of the evaluation with the patient. Initial appointment was missed, and attempts to re-schedule have not yet been successful.   Additional attempts to connect with the patient will be made and this report will be edited accordingly if successful.   Diagnosis: Other symptoms and signs involving cognitive functions and awareness [R41.89]   As noted, there is significant concern for neurocognitive disorder, but ability to make this diagnosis is limited due for the reasons indicated above. Conclusions regarding possible etiology is similarly challenged.   Recommendations:  Follow-up with the referring provider as planned.  Feedback for results of the evaluation have not yet been provided to the patient due to missed appointment, and rescheduling has been unsuccessful. I encourage the patient  to reach out to review results [phone]). A re-evaluation and attempt to obtain reliable cognitive data would be beneficial, although patient interest in participating is suspected to be low. I would be happy to conduct a re-evaluation, and encourage the patient to reach out if interested. Similarly, if the patient's referring provider or other provider communicates with the patient and the patient is amenable to proceeding, please contact me directly so I can prioritize getting him to avoid the wait-list.   I defer to the referring physician regarding cognitive medication recommendations. I am unable to speak to likely etiology with confidence from information in  this evaluation, and would be relying more on medical test results with which the referring provider has greater expertise in interpreting.  Although relative contributions to cognitive difficulties cannot be determined, several medications the patient takes can have cognitive side effects. Medication decisions are beyond the scope of my expertise, but if the patient has any concerns regarding cognitive side effects I encourage discussion with the patient and the prescribing physician.  Management of cardiovascular health is important given the potential contributions from cerebrovascular disease.   Independent Functioning:  Given cognitive difficulties, the patient is encouraged to utilizing compensatory strategies. (Medication organizers/pillboxes, alarms/reminders, setting bills to auto-pay when possible). The patient denies functional difficulties, so further / more detailed recommendations are deferred at this time.   General recommendations for promoting cognitive health: Multi-domain lifestyle and behavioral recommendations have found to be beneficial for individuals with neurocognitive disorders. Benefits involve promoting cognitive and physical health, and independent functioning. Interventions are most effective early in disease  course, but can be beneficial even later on.  Staying physically active ideally with physical exercise (but only if appropriate/safe and with physician supervision and approval) can be beneficial for quality of life, physical functioning, and cardiovascular health.  Cardiovascular health and management of other chronic medical conditions is recommended. Examples include high cholesterol, hypertension, diabetes, obstructive sleep apnea etc. Management is important overall but even more so for individuals with cerebrovascular disease or other cardiovascular health conditions. Follow your doctors recommendations, take medication as prescribed, eat healthy, exercise, etc.  Dietary interventions, particularly the Mediterranean or MIND diet emphasizing green leafy vegetables, nuts, berries, and fish, are recommended. An internet search for MIND diet will provide many resources with additional information if interested.  Cognitive stimulation/stay active! Cognitive stimulation is also beneficial for cognitive health and reducing risk of cognitive decline. Cognitively stimulating activities are at their core activities which require your active participation, concentration, and cognitive engagement. Cognitive stimulation can take many forms. Reading (as you are doing now), puzzles, and even some activities of daily living can apply. It doesn't need to be a formal exercise. For example, social interaction is an exceptionally stimulating activity from a cognitive perspective. Activities can be, and ideally are, things you enjoy. Find what works for you!   Psychiatric  Difficulties with anxiety and depression can negatively impact cognitive functioning. Engaging in regular exercise is beneficial for both mood and anxiety. Reach out to your treatment providers if interested in additional options for management of mental health.   Resources:  Given concern for possible Alzheimer's disease; The Alzheimer's Association  is an excellent resource for patients and families / loved-ones. The Alzheimer's Association website (limitlaws.hu) offers comprehensive information about dementia, local support services, care consultation, and support groups. There are two chapters in [place] .Alzheimer's Association helpline ([phone] 405-3412) (available, free, 24/7) will connect you with master's-level clinicians consultants for decision-making support, crisis assistance, and education.   Evalene DOROTHA Riff, PsyD [facility] [address] [place], [place] [address] Main: [phone] Direct: [phone] Fax: [phone] [facility] # [id number]  This report was generated using voice recognition software. While this document has been carefully reviewed, transcription errors may be present. I apologize in advance for any inconvenience. Please contact me if further clarification is needed.    "

## 2024-03-04 ENCOUNTER — Other Ambulatory Visit: Payer: Self-pay

## 2024-03-04 ENCOUNTER — Other Ambulatory Visit: Payer: Self-pay | Admitting: Internal Medicine

## 2024-03-14 ENCOUNTER — Other Ambulatory Visit: Payer: Self-pay

## 2024-03-14 ENCOUNTER — Other Ambulatory Visit: Payer: Self-pay | Admitting: Internal Medicine

## 2024-03-17 ENCOUNTER — Other Ambulatory Visit: Payer: Self-pay | Admitting: Internal Medicine

## 2024-03-17 ENCOUNTER — Other Ambulatory Visit: Payer: Self-pay

## 2024-03-24 ENCOUNTER — Ambulatory Visit

## 2024-03-24 VITALS — BP 126/70 | HR 94 | Ht 66.0 in | Wt 208.8 lb

## 2024-03-24 DIAGNOSIS — Z0000 Encounter for general adult medical examination without abnormal findings: Secondary | ICD-10-CM

## 2024-03-24 NOTE — Progress Notes (Signed)
 "  Chief Complaint  Patient presents with   Medicare Wellness     Subjective:   Scott [name] is a 69 y.o. male who presents for a Medicare Annual Wellness Visit.  Visit info / Clinical Intake: Medicare Wellness Visit Type:: Subsequent Annual Wellness Visit Persons participating in visit and providing information:: patient Medicare Wellness Visit Mode:: In-person (required for WTM) Interpreter Needed?: No Pre-visit prep was completed: yes AWV questionnaire completed by patient prior to visit?: no Living arrangements:: (!) lives alone Patient's Overall Health Status Rating: good Typical amount of pain: some Does pain affect daily life?: (!) yes (rt leg) Are you currently prescribed opioids?: no  Dietary Habits and Nutritional Risks How many meals a day?: 2 Eats fruit and vegetables daily?: (!) no (veggies) Most meals are obtained by: preparing own meals In the last 2 weeks, have you had any of the following?: none Diabetic:: no  Functional Status Activities of Daily Living (to include ambulation/medication): Independent Ambulation: Independent with device- listed below Home Assistive Devices/Equipment: CPAP; Eyeglasses; Cane Medication Administration: Independent Home Management (perform basic housework or laundry): Independent Manage your own finances?: yes Primary transportation is: driving Concerns about vision?: no *vision screening is required for WTM* Concerns about hearing?: no  Fall Screening Falls in the past year?: 1 Number of falls in past year: 1 Was there an injury with Fall?: 0 Fall Risk Category Calculator: 2 Patient Fall Risk Level: Moderate Fall Risk  Fall Risk Patient at Risk for Falls Due to: Impaired balance/gait Fall risk Follow up: Falls evaluation completed; Falls prevention discussed  Home and Transportation Safety: All rugs have non-skid backing?: N/A, no rugs All stairs or steps have railings?: yes (outside in front and back) Grab  bars in the bathtub or shower?: yes Have non-skid surface in bathtub or shower?: yes Good home lighting?: yes Regular seat belt use?: yes Hospital stays in the last year:: no  Cognitive Assessment Difficulty concentrating, remembering, or making decisions? : yes (remembering) Will 6CIT or Mini Cog be Completed: yes What year is it?: 0 points (2025) What month is it?: 0 points Give patient an address phrase to remember (5 components): [address], [place] About what time is it?: 0 points Count backwards from 20 to 1: 0 points Say the months of the year in reverse: 4 points Repeat the address phrase from earlier: 0 points 6 CIT Score: 4 points  Advance Directives (For Healthcare) Does Patient Have a Medical Advance Directive?: Yes Does patient want to make changes to medical advance directive?: No - Patient declined Type of Advance Directive: Healthcare Power of [place]; Living will; Out of facility DNR (pink MOST or yellow form) Copy of Healthcare Power of Attorney in Chart?: No - copy requested Healthcare Power of Attorney Requested and Now in Chart: Copy in chart Copy of Living Will in Chart?: No - copy requested Living Will Requested and Now in Chart: Copy in chart Out of facility DNR (pink MOST or yellow form) in Chart? (Ambulatory ONLY): No - copy requested  Reviewed/Updated  Reviewed/Updated: Reviewed All (Medical, Surgical, Family, Medications, Allergies, Care Teams, Patient Goals)    Allergies (verified) Zolpidem tartrate   Current Medications (verified) Outpatient Encounter Medications as of 03/24/2024  Medication Sig   acetaminophen  (TYLENOL ) 500 MG tablet Take 1,000 mg by mouth every 6 (six) hours as needed for mild pain.    albuterol  (VENTOLIN  HFA) 108 (90 Base) MCG/ACT inhaler TAKE 2 PUFFS BY MOUTH EVERY 6 HOURS AS NEEDED  FOR WHEEZE OR SHORTNESS OF BREATH   allopurinol  (ZYLOPRIM ) 100 MG tablet TAKE 1 TABLET (100 MG TOTAL) BY MOUTH DAILY TO LOWER URIC ACID AND  PREVENT GOUT   ALPRAZolam  (XANAX ) 0.5 MG tablet TAKE 1 TABLET BY MOUTH TWICE A DAY AS NEEDED FOR ANXIETY   amLODipine  (NORVASC ) 5 MG tablet Take 1 tablet (5 mg total) by mouth daily.   cholecalciferol  (VITAMIN D ) 1000 units tablet Take 1,000 Units by mouth daily.   clotrimazole -betamethasone  (LOTRISONE ) cream Apply 1 Application topically daily.   co-enzyme Q-10 30 MG capsule Take 30 mg by mouth daily.   colchicine  0.6 MG tablet TAKE 1 TABLET BY MOUTH DAILY AS NEEDED FOR GOUT PAIN   cyclobenzaprine  (FLEXERIL ) 5 MG tablet Take 1 tablet (5 mg total) by mouth 3 (three) times daily as needed for muscle spasms.   docusate sodium  (COLACE) 100 MG capsule Take 200 mg by mouth daily as needed for mild constipation.    ELIQUIS  5 MG TABS tablet TAKE 1 TABLET BY MOUTH TWICE A DAY   Fluticasone -Umeclidin-Vilant (TRELEGY ELLIPTA ) 100-62.5-25 MCG/ACT AEPB Take 1 inhalation once daily   furosemide  (LASIX ) 20 MG tablet TAKE 1 TABLET BY MOUTH EVERY DAY AS NEEDED   gabapentin  (NEURONTIN ) 100 MG capsule TAKE 1 CAPSULE BY MOUTH THREE TIMES A DAY   hydrochlorothiazide  (MICROZIDE ) 12.5 MG capsule Take 1 capsule (12.5 mg total) by mouth daily.   hydrocortisone  (ANUSOL -HC) 2.5 % rectal cream Place 1 Application rectally at bedtime.   lovastatin  (MEVACOR ) 40 MG tablet Take 1 tablet (40 mg total) by mouth daily.   metoprolol  succinate (TOPROL -XL) 50 MG 24 hr tablet TAKE 1 TABLETS once DAILY BY MOUTH   Multiple Vitamin (MULTIVITAMIN WITH MINERALS) TABS tablet Take 1 tablet by mouth daily.    NON FORMULARY Pt uses cpap nightly   olmesartan  (BENICAR ) 40 MG tablet Take 1 tablet (40 mg total) by mouth daily.   spironolactone  (ALDACTONE ) 25 MG tablet TAKE 1/2 TABLET BY MOUTH AT BEDTIME   temazepam  (RESTORIL ) 30 MG capsule TAKE 1 CAPSULE (30 MG TOTAL) BY MOUTH AT BEDTIME AS NEEDED. FOR SLEEP   tiZANidine  (ZANAFLEX ) 4 MG tablet Take 1 tablet (4 mg total) by mouth every 6 (six) hours as needed for muscle spasms.   traMADol   (ULTRAM ) 50 MG tablet TAKE 1 TABLET BY MOUTH EVERY 6 HOURS AS NEEDED   donepezil  (ARICEPT ) 5 MG tablet TAKE 1 TABLET BY MOUTH EVERYDAY AT BEDTIME   No facility-administered encounter medications on file as of 03/24/2024.    History: Past Medical History:  Diagnosis Date   ALLERGIC RHINITIS 01/14/2007   Allergy    ANXIETY 09/28/2006   Asbestos exposure    Atrial fibrillation (HCC) 09/28/2006   s/p afib ablation x 2   CHF (congestive heart failure) (HCC)    CKD (chronic kidney disease), stage III (HCC)    patient denies   COPD (chronic obstructive pulmonary disease) (HCC)    Coronary artery disease 11/24/2011   Diastolic dysfunction 09/28/2006   Dyspnea    W/ EXERTION    Dysrhythmia    AFIB      First degree AV block 10/12/2018   Noted on EKG   Gallstones    Hyperglycemia    HYPERLIPIDEMIA 09/28/2006   HYPERTENSION 09/28/2006   HYPOTHYROIDISM, ACQUIRED NEC 09/28/2006   pt denies and doesn't know   Long term current use of anticoagulant 04/08/2010   Obese    OBSTRUCTIVE SLEEP APNEA 01/06/2008   NOT USING     Right  knee DJD 07/09/2010   S/P CABG x 1 11/26/2011   Right internal mammary artery to right coronary artery   S/P Maze operation for atrial fibrillation 11/26/2011   complete biatrial lesion set with clipping of LA appendage   Past Surgical History:  Procedure Laterality Date   ANTERIOR CERVICAL DECOMP/DISCECTOMY FUSION N/A 06/20/2016   Procedure: Anterior Cervical Discectomy Fusion - Cervical five-Cervical six - Cervical six-Cervical seven - Cervicla seven- Thoracic one;  Surgeon: Victory Gunnels, MD;  Location[facility] OR;  Service: Neurosurgery;  Laterality: N/A;   CARDIOVERSION     X4    CORONARY ARTERY BYPASS GRAFT  11/26/2011   Procedure: CORONARY ARTERY BYPASS GRAFTING (CABG);  Surgeon: Sudie VEAR Laine, MD;  Location: [facility] OR;  Service: Open Heart Surgery;  Laterality: N/A;  coronary artery bypass on pump times one utilizing the right internal mammary artery,  transesophageal echocardiogram    HAND SURGERY Left    KNEE ARTHROSCOPY Right 12/10/2021   Procedure: RIGH TKNEE ARTHROSCOPY WITH SCAR BAND EXCISION;  Surgeon: Sheril Coy, MD;  Location: [facility] ORS;  Service: Orthopedics;  Laterality: Right;   LEFT HEART CATHETERIZATION WITH CORONARY ANGIOGRAM N/A 11/24/2011   Procedure: LEFT HEART CATHETERIZATION WITH CORONARY ANGIOGRAM;  Surgeon: Peter M Jordan, MD;  Location: [facility] CATH LAB;  Service: Cardiovascular;  Laterality: N/A;   LOOP RECORDER IMPLANT N/A 07/06/2012   Procedure: LOOP RECORDER IMPLANT;  Surgeon: Lynwood Rakers, MD;  Location: [facility] CATH LAB;  Service: Cardiovascular;  Laterality: N/A;   MAZE  11/26/2011   Procedure: MAZE;  Surgeon: Sudie VEAR Laine, MD;  Location: [facility] OR;  Service: Open Heart Surgery;  Laterality: N/A;  Cryomaze    Radiofrequency Ablation for atrial fibrillation  12/30/07 and 06/05/09   afib ablation x 2 by [facility]   Radiofrequency ablation for atrial flutter  2005   CTI   TOTAL HIP ARTHROPLASTY Right 09/29/2023   Procedure: ARTHROPLASTY, HIP, TOTAL, ANTERIOR APPROACH;  Surgeon: Sheril Coy, MD;  Location: [facility] ORS;  Service: Orthopedics;  Laterality: Right;   TOTAL KNEE ARTHROPLASTY Right 04/05/2019   Procedure: RIGHT TOTAL KNEE ARTHROPLASTY;  Surgeon: Sheril Coy, MD;  Location: [facility] ORS;  Service: Orthopedics;  Laterality: Right;   Family History  Problem Relation Age of Onset   Dementia Father    Hypertension Father    Atrial fibrillation Mother    Stroke Mother    Dementia Paternal Grandfather    Cancer Paternal Grandmother        type unknown   Colon cancer Neg Hx    Esophageal cancer Neg Hx    Prostate cancer Neg Hx    Rectal cancer Neg Hx    Stomach cancer Neg Hx    Social History   Occupational History   Occupation: works at the [employer] part-time  Tobacco Use   Smoking status: Former    Current packs/day: 0.00    Average packs/day: 1.5 packs/day for 26.0 years (39.0 ttl pk-yrs)    Types: Cigarettes     Start date: 04/05/1969    Quit date: 04/06/1995    Years since quitting: 28.9   Smokeless tobacco: Never  Vaping Use   Vaping status: Never Used  Substance and Sexual Activity   Alcohol use: No    Alcohol/week: 0.0 standard drinks of alcohol   Drug use: No   Sexual activity: Yes   Tobacco Counseling Counseling given: Not Answered  SDOH Screenings   Food Insecurity: No Food Insecurity (03/24/2024)  Housing: Unknown (03/24/2024)  Transportation Needs: No  Transportation Needs (03/24/2024)  Utilities: Not At Risk (03/24/2024)  Alcohol Screen: Low Risk (03/02/2023)  Depression (PHQ2-9): Low Risk (03/24/2024)  Financial Resource Strain: Low Risk (03/02/2023)  Physical Activity: Inactive (03/24/2024)  Social Connections: Socially Isolated (03/24/2024)  Stress: No Stress Concern Present (03/24/2024)  Tobacco Use: Medium Risk (03/24/2024)  Health Literacy: Adequate Health Literacy (03/24/2024)   See flowsheets for full screening details  Depression Screen PHQ 2 & 9 Depression Scale- Over the past 2 weeks, how often have you been bothered by any of the following problems? Little interest or pleasure in doing things: 0 Feeling down, depressed, or hopeless (PHQ Adolescent also includes...irritable): 0 PHQ-2 Total Score: 0 Trouble falling or staying asleep, or sleeping too much: 0 Feeling tired or having little energy: 1 Poor appetite or overeating (PHQ Adolescent also includes...weight loss): 0 Feeling bad about yourself - or that you are a failure or have let yourself or your family down: 0 Trouble concentrating on things, such as reading the newspaper or watching television (PHQ Adolescent also includes...like school work): 0 Moving or speaking so slowly that other people could have noticed. Or the opposite - being so fidgety or restless that you have been moving around a lot more than usual: 0 Thoughts that you would be better off dead, or of hurting yourself in some way: 0 PHQ-9 Total Score:  1 If you checked off any problems, how difficult have these problems made it for you to do your work, take care of things at home, or get along with other people?: Not difficult at all  Depression Treatment Depression Interventions/Treatment : EYV7-0 Score <4 Follow-up Not Indicated     Goals Addressed               This Visit's Progress     Patient Stated (pt-stated)   On track     Would like to lose weight             Objective:    Today's Vitals   03/24/24 1555  BP: 126/70  Pulse: 94  SpO2: 94%  Weight: 208 lb 12.8 oz (94.7 kg)  Height: 5' 6 (1.676 m)   Body mass index is 33.7 kg/m.  Hearing/Vision screen Hearing Screening - Comments:: Denies hearing difficulties   Vision Screening - Comments:: Wears eyeglasses/UTD Immunizations and Health Maintenance Health Maintenance  Topic Date Due   COVID-19 Vaccine (8 - 2025-26 season) 10/26/2023   DTaP/Tdap/Td (3 - Tdap) 04/04/2024   Medicare Annual Wellness (AWV)  03/24/2025   Colonoscopy  05/03/2025   Pneumococcal Vaccine: 50+ Years  Completed   Influenza Vaccine  Completed   Hepatitis C Screening  Completed   Zoster Vaccines- Shingrix  Completed   Meningococcal B Vaccine  Aged Out        Assessment/Plan:  This is a routine wellness examination for [place].  Patient Care Team: Norleen Lynwood ORN, MD as PCP - General Shlomo Wilbert SAUNDERS, MD as PCP - Sleep Medicine (Cardiology) Waddell Danelle ORN, MD as Consulting Physician (Cardiology) Abran Norleen SAILOR, MD as Consulting Physician (Gastroenterology) Madelyn Deanne BRAVO, OD as Consulting Physician (Optometry)  I have personally reviewed and noted the following in the patients chart:   Medical and social history Use of alcohol, tobacco or illicit drugs  Current medications and supplements including opioid prescriptions. Functional ability and status Nutritional status Physical activity Advanced directives List of other physicians Hospitalizations, surgeries, and ER  visits in previous 12 months Vitals Screenings to include cognitive, depression, and falls Referrals  and appointments  No orders of the defined types were placed in this encounter.  In addition, I have reviewed and discussed with patient certain preventive protocols, quality metrics, and best practice recommendations. A written personalized care plan for preventive services as well as general preventive health recommendations were provided to patient.   Shellee Streng L Jasir Rother, CMA   03/24/2024   Return in 1 year (on 03/24/2025).  After Visit Summary: (MyChart) Due to this being a telephonic visit, the after visit summary with patients personalized plan was offered to patient via MyChart   Nurse Notes: Patient is up to date on all health maintenance with no concerns to address today.  "

## 2024-03-24 NOTE — Patient Instructions (Addendum)
 [name],  Thank you for taking the time for your Medicare Wellness Visit. I appreciate your continued commitment to your health goals. Please review the care plan we discussed, and feel free to reach out if I can assist you further.  Please note that Annual Wellness Visits do not include a physical exam. Some assessments may be limited, especially if the visit was conducted virtually. If needed, we may recommend an in-person follow-up with your provider.  Ongoing Care Seeing your primary care provider every 3 to 6 months helps us  monitor your health and provide consistent, personalized care. Next office visit on 06/13/2024.  Each day, aim for 6 glasses of water, plenty of protein in your diet and try to get up and walk/ stretch every hour for 5-10 minutes at a time.    Referrals If a referral was made during today's visit and you haven't received any updates within two weeks, please contact the referred provider directly to check on the status.  Recommended Screenings:  Health Maintenance  Topic Date Due   COVID-19 Vaccine (8 - 2025-26 season) 10/26/2023   Medicare Annual Wellness Visit  03/01/2024   DTaP/Tdap/Td vaccine (3 - Tdap) 04/04/2024   Colon Cancer Screening  05/03/2025   Pneumococcal Vaccine for age over 2  Completed   Flu Shot  Completed   Hepatitis C Screening  Completed   Zoster (Shingles) Vaccine  Completed   Meningitis B Vaccine  Aged Out       10/01/2023    8:36 AM  Advanced Directives  Does Patient Have a Medical Advance Directive? Yes  Type of Advance Directive Healthcare Power of Attorney  Does patient want to make changes to medical advance directive? No - Patient declined  Copy of Healthcare Power of Attorney in Chart? Yes - validated most recent copy scanned in chart (See row information)    Vision: Annual vision screenings are recommended for early detection of glaucoma, cataracts, and diabetic retinopathy. These exams can also reveal signs of chronic  conditions such as diabetes and high blood pressure.  Dental: Annual dental screenings help detect early signs of oral cancer, gum disease, and other conditions linked to overall health, including heart disease and diabetes.  Please see the attached documents for additional preventive care recommendations.

## 2024-06-13 ENCOUNTER — Ambulatory Visit: Admitting: Family Medicine
